# Patient Record
Sex: Male | Born: 1965 | Race: Black or African American | Hispanic: No | Marital: Married | Smoking: Never smoker
Health system: Southern US, Community
[De-identification: ages and names within clinical notes are randomized; demographics above are authoritative.]

## PROBLEM LIST (undated history)

## (undated) DIAGNOSIS — I10 Essential (primary) hypertension: Secondary | ICD-10-CM

## (undated) DIAGNOSIS — I7121 Aneurysm of the ascending aorta, without rupture: Secondary | ICD-10-CM

## (undated) DIAGNOSIS — K226 Gastro-esophageal laceration-hemorrhage syndrome: Secondary | ICD-10-CM

## (undated) DIAGNOSIS — Z973 Presence of spectacles and contact lenses: Secondary | ICD-10-CM

## (undated) DIAGNOSIS — R9431 Abnormal electrocardiogram [ECG] [EKG]: Secondary | ICD-10-CM

## (undated) DIAGNOSIS — I471 Supraventricular tachycardia, unspecified: Secondary | ICD-10-CM

## (undated) DIAGNOSIS — D649 Anemia, unspecified: Secondary | ICD-10-CM

## (undated) DIAGNOSIS — E785 Hyperlipidemia, unspecified: Secondary | ICD-10-CM

## (undated) DIAGNOSIS — R51 Headache: Secondary | ICD-10-CM

## (undated) DIAGNOSIS — E782 Mixed hyperlipidemia: Secondary | ICD-10-CM

## (undated) DIAGNOSIS — L97509 Non-pressure chronic ulcer of other part of unspecified foot with unspecified severity: Secondary | ICD-10-CM

## (undated) DIAGNOSIS — E11621 Type 2 diabetes mellitus with foot ulcer: Secondary | ICD-10-CM

## (undated) DIAGNOSIS — I499 Cardiac arrhythmia, unspecified: Secondary | ICD-10-CM

## (undated) DIAGNOSIS — I5042 Chronic combined systolic (congestive) and diastolic (congestive) heart failure: Secondary | ICD-10-CM

## (undated) DIAGNOSIS — J189 Pneumonia, unspecified organism: Secondary | ICD-10-CM

## (undated) DIAGNOSIS — Z992 Dependence on renal dialysis: Secondary | ICD-10-CM

## (undated) DIAGNOSIS — N186 End stage renal disease: Secondary | ICD-10-CM

## (undated) DIAGNOSIS — I2510 Atherosclerotic heart disease of native coronary artery without angina pectoris: Secondary | ICD-10-CM

## (undated) DIAGNOSIS — I4892 Unspecified atrial flutter: Secondary | ICD-10-CM

## (undated) DIAGNOSIS — B20 Human immunodeficiency virus [HIV] disease: Secondary | ICD-10-CM

## (undated) DIAGNOSIS — M86271 Subacute osteomyelitis, right ankle and foot: Secondary | ICD-10-CM

## (undated) DIAGNOSIS — I712 Thoracic aortic aneurysm, without rupture: Secondary | ICD-10-CM

## (undated) DIAGNOSIS — I1311 Hypertensive heart and chronic kidney disease without heart failure, with stage 5 chronic kidney disease, or end stage renal disease: Secondary | ICD-10-CM

## (undated) DIAGNOSIS — R519 Headache, unspecified: Secondary | ICD-10-CM

## (undated) DIAGNOSIS — E119 Type 2 diabetes mellitus without complications: Secondary | ICD-10-CM

## (undated) DIAGNOSIS — E1152 Type 2 diabetes mellitus with diabetic peripheral angiopathy with gangrene: Principal | ICD-10-CM

## (undated) DIAGNOSIS — I480 Paroxysmal atrial fibrillation: Secondary | ICD-10-CM

## (undated) DIAGNOSIS — N2581 Secondary hyperparathyroidism of renal origin: Secondary | ICD-10-CM

## (undated) HISTORY — DX: Supraventricular tachycardia: I47.1

## (undated) HISTORY — DX: Gastro-esophageal laceration-hemorrhage syndrome: K22.6

## (undated) HISTORY — DX: Supraventricular tachycardia, unspecified: I47.10

## (undated) HISTORY — DX: Chronic combined systolic (congestive) and diastolic (congestive) heart failure: I50.42

## (undated) HISTORY — DX: Abnormal electrocardiogram (ECG) (EKG): R94.31

## (undated) HISTORY — DX: Secondary hyperparathyroidism of renal origin: N25.81

## (undated) HISTORY — DX: Type 2 diabetes mellitus with foot ulcer: E11.621

## (undated) HISTORY — DX: Anemia, unspecified: D64.9

## (undated) HISTORY — DX: Hypertensive heart and chronic kidney disease without heart failure, with stage 5 chronic kidney disease, or end stage renal disease: I13.11

## (undated) HISTORY — PX: AMPUTATION: SHX166

## (undated) HISTORY — DX: Type 2 diabetes mellitus with diabetic peripheral angiopathy with gangrene: E11.52

## (undated) HISTORY — DX: Subacute osteomyelitis, right ankle and foot: M86.271

## (undated) HISTORY — DX: Aneurysm of the ascending aorta, without rupture: I71.21

## (undated) HISTORY — DX: Dependence on renal dialysis: Z99.2

## (undated) HISTORY — DX: Paroxysmal atrial fibrillation: I48.0

## (undated) HISTORY — DX: End stage renal disease: N18.6

## (undated) HISTORY — PX: BELOW KNEE LEG AMPUTATION: SUR23

## (undated) HISTORY — DX: Non-pressure chronic ulcer of other part of unspecified foot with unspecified severity: L97.509

## (undated) HISTORY — DX: Thoracic aortic aneurysm, without rupture: I71.2

## (undated) HISTORY — DX: Hyperlipidemia, unspecified: E78.5

## (undated) HISTORY — PX: AV FISTULA REPAIR: SHX563

## (undated) HISTORY — DX: Unspecified atrial flutter: I48.92

## (undated) HISTORY — DX: Human immunodeficiency virus (HIV) disease: B20

## (undated) HISTORY — DX: Atherosclerotic heart disease of native coronary artery without angina pectoris: I25.10

## (undated) HISTORY — DX: Essential (primary) hypertension: I10

---

## 2012-10-19 HISTORY — PX: AV FISTULA PLACEMENT: SHX1204

## 2016-12-30 DIAGNOSIS — K226 Gastro-esophageal laceration-hemorrhage syndrome: Secondary | ICD-10-CM

## 2016-12-30 HISTORY — DX: Gastro-esophageal laceration-hemorrhage syndrome: K22.6

## 2017-02-07 ENCOUNTER — Encounter: Payer: Self-pay | Admitting: Cardiology

## 2017-02-12 ENCOUNTER — Ambulatory Visit: Payer: Self-pay | Admitting: Cardiology

## 2017-02-14 ENCOUNTER — Telehealth: Payer: Self-pay

## 2017-02-14 ENCOUNTER — Telehealth: Payer: Self-pay | Admitting: Lab

## 2017-02-14 NOTE — Telephone Encounter (Signed)
Patient made appointments on 03/10/17 for labs and 03/24/17 @ 200 to see Dr. Baxter Flattery.

## 2017-02-14 NOTE — Telephone Encounter (Signed)
SENT NOTES TO SCHEDULING 

## 2017-02-14 NOTE — Telephone Encounter (Signed)
02/07/17-He said he would call me back.  He was in a meeting.  I reached out  To him today and he is now scheduled for 03/10/17@1145  for labs and 03/24/17 @ 200 to see Dr. Baxter Flattery.  He asked me to mail his information along with the address.

## 2017-03-10 ENCOUNTER — Other Ambulatory Visit: Payer: Medicare Other

## 2017-03-10 DIAGNOSIS — Z79899 Other long term (current) drug therapy: Secondary | ICD-10-CM

## 2017-03-10 DIAGNOSIS — Z113 Encounter for screening for infections with a predominantly sexual mode of transmission: Secondary | ICD-10-CM

## 2017-03-10 DIAGNOSIS — B20 Human immunodeficiency virus [HIV] disease: Secondary | ICD-10-CM

## 2017-03-10 DIAGNOSIS — Z21 Asymptomatic human immunodeficiency virus [HIV] infection status: Secondary | ICD-10-CM

## 2017-03-10 LAB — HEPATITIS A ANTIBODY, TOTAL: Hep A Total Ab: REACTIVE — AB

## 2017-03-10 LAB — CBC WITH DIFFERENTIAL/PLATELET
BASOS ABS: 69 {cells}/uL (ref 0–200)
Basophils Relative: 1 %
EOS PCT: 4 %
Eosinophils Absolute: 276 cells/uL (ref 15–500)
HCT: 31.2 % — ABNORMAL LOW (ref 38.5–50.0)
HEMOGLOBIN: 10.4 g/dL — AB (ref 13.2–17.1)
LYMPHS ABS: 1173 {cells}/uL (ref 850–3900)
Lymphocytes Relative: 17 %
MCH: 31 pg (ref 27.0–33.0)
MCHC: 33.3 g/dL (ref 32.0–36.0)
MCV: 92.9 fL (ref 80.0–100.0)
MPV: 10.4 fL (ref 7.5–12.5)
Monocytes Absolute: 414 cells/uL (ref 200–950)
Monocytes Relative: 6 %
NEUTROS ABS: 4968 {cells}/uL (ref 1500–7800)
Neutrophils Relative %: 72 %
Platelets: 255 10*3/uL (ref 140–400)
RBC: 3.36 MIL/uL — AB (ref 4.20–5.80)
RDW: 19.9 % — ABNORMAL HIGH (ref 11.0–15.0)
WBC: 6.9 10*3/uL (ref 3.8–10.8)

## 2017-03-10 LAB — HEPATITIS C ANTIBODY: HCV Ab: NEGATIVE

## 2017-03-10 LAB — HEPATITIS B CORE ANTIBODY, TOTAL: HEP B C TOTAL AB: NONREACTIVE

## 2017-03-10 LAB — T-HELPER CELL (CD4) - (RCID CLINIC ONLY)
CD4 T CELL HELPER: 33 % (ref 33–55)
CD4 T Cell Abs: 420 /uL (ref 400–2700)

## 2017-03-10 LAB — HEPATITIS B SURFACE ANTIBODY,QUALITATIVE: HEP B S AB: POSITIVE — AB

## 2017-03-10 LAB — HEPATITIS B SURFACE ANTIGEN: Hepatitis B Surface Ag: NEGATIVE

## 2017-03-11 ENCOUNTER — Telehealth: Payer: Self-pay

## 2017-03-11 LAB — COMPLETE METABOLIC PANEL WITH GFR
ALBUMIN: 3.9 g/dL (ref 3.6–5.1)
ALK PHOS: 150 U/L — AB (ref 40–115)
ALT: 13 U/L (ref 9–46)
AST: 13 U/L (ref 10–35)
BILIRUBIN TOTAL: 0.6 mg/dL (ref 0.2–1.2)
BUN: 45 mg/dL — AB (ref 7–25)
CO2: 24 mmol/L (ref 20–31)
Calcium: 9.7 mg/dL (ref 8.6–10.3)
Chloride: 96 mmol/L — ABNORMAL LOW (ref 98–110)
Creat: 9.13 mg/dL — ABNORMAL HIGH (ref 0.70–1.33)
GFR, Est African American: 7 mL/min — ABNORMAL LOW (ref >=60)
GFR, Est Non African American: 6 mL/min — ABNORMAL LOW (ref >=60)
GLUCOSE: 303 mg/dL — AB (ref 65–99)
Potassium: 4.5 mmol/L (ref 3.5–5.3)
SODIUM: 136 mmol/L (ref 135–146)
TOTAL PROTEIN: 7.3 g/dL (ref 6.1–8.1)

## 2017-03-11 LAB — LIPID PANEL
CHOLESTEROL: 141 mg/dL (ref <200)
HDL: 22 mg/dL — ABNORMAL LOW (ref >40)
LDL Cholesterol: 62 mg/dL (ref <100)
Total CHOL/HDL Ratio: 6.4 Ratio — ABNORMAL HIGH (ref <5.0)
Triglycerides: 284 mg/dL — ABNORMAL HIGH (ref <150)
VLDL: 57 mg/dL — ABNORMAL HIGH (ref <30)

## 2017-03-11 LAB — RPR

## 2017-03-11 NOTE — Telephone Encounter (Signed)
Katrina with [facility] calling to report critical value 9.13 creatinine Value is now available in EPIC.  Please advise.

## 2017-03-12 ENCOUNTER — Encounter: Payer: Self-pay | Admitting: Cardiology

## 2017-03-12 ENCOUNTER — Encounter (INDEPENDENT_AMBULATORY_CARE_PROVIDER_SITE_OTHER): Payer: Self-pay

## 2017-03-12 ENCOUNTER — Ambulatory Visit (INDEPENDENT_AMBULATORY_CARE_PROVIDER_SITE_OTHER): Payer: Medicare Other | Admitting: Cardiology

## 2017-03-12 VITALS — BP 142/80 | HR 93 | Ht 78.0 in | Wt 285.0 lb

## 2017-03-12 DIAGNOSIS — I1311 Hypertensive heart and chronic kidney disease without heart failure, with stage 5 chronic kidney disease, or end stage renal disease: Secondary | ICD-10-CM

## 2017-03-12 DIAGNOSIS — I481 Persistent atrial fibrillation: Secondary | ICD-10-CM

## 2017-03-12 DIAGNOSIS — R002 Palpitations: Secondary | ICD-10-CM

## 2017-03-12 DIAGNOSIS — E118 Type 2 diabetes mellitus with unspecified complications: Secondary | ICD-10-CM

## 2017-03-12 DIAGNOSIS — Z992 Dependence on renal dialysis: Secondary | ICD-10-CM | POA: Diagnosis not present

## 2017-03-12 DIAGNOSIS — N186 End stage renal disease: Secondary | ICD-10-CM

## 2017-03-12 DIAGNOSIS — I4819 Other persistent atrial fibrillation: Secondary | ICD-10-CM

## 2017-03-12 LAB — HIV-1 RNA QUANT-NO REFLEX-BLD
HIV 1 RNA Quant: 30 copies/mL — ABNORMAL HIGH
HIV-1 RNA QUANT, LOG: 1.48 {Log_copies}/mL — AB

## 2017-03-12 LAB — QUANTIFERON TB GOLD ASSAY (BLOOD)
Interferon Gamma Release Assay: NEGATIVE
Mitogen-Nil: 5.65 IU/mL
QUANTIFERON TB AG MINUS NIL: 0.03 [IU]/mL
Quantiferon Nil Value: 0.02 IU/mL

## 2017-03-12 NOTE — Progress Notes (Signed)
Cardiology Office Note    Date:  03/13/2017   ID:  Thomas [name], DOB December 03, 1966, MRN [id number]  PCP:  Thomas [name], PAP  Cardiologist:  Thomas Him, MD   Chief Complaint  Patient presents with  . Establish Care    History of Present Illness:  Thomas [name] is a 51 y.o. male with a history of DM, ESRD, HIV, HTN who is referred here by his PCP, Thomas Ohs, MD, to establish a ne Cardiologist for following his history of SVT and palpitations.  He says that the palpitations started about 6 months.  He only had it happen 2 times.  He went to HD and his heart was beating fast and went to the ER.  He had some type of SVT and had what sounds like from his description as afib or flutter and had a TEE/DCCV and about 2 weeks later had reoccurrence of the arrhythmia and had an ablation.  Since then he has not had any problems.  He has a CHADS2VASC score of 2 and is on chronic anticoagulation with warfarin.  He denies any further problems since his ablation.  He denies any chest pain or pressure.  He occasionally has SOB and only occurs when he has retained too much fluid.  He denies any LE edema.  He denies any syncope but he will get dizzy after HD if too much fluid has been pulled off.      Past Medical History:  Diagnosis Date  . Abdominal pain   . Anemia   . Diabetes ([place])   . ESRD (end stage renal disease) ([place])   . ESRD on dialysis ([place]) 03/13/2017  . Fatigue   . HIV disease ([place])   . Hyperparathyroidism, secondary ([place])   . Hypertension   . Hypertensive heart disease with end stage renal disease on dialysis ([place]) 03/13/2017  . Pulmonary congestion   . SOB (shortness of breath)   . SVT (supraventricular tachycardia) ([place])    ? afib or atrial flutter s/p TEE/DCCV with subsequent ablation due to reoccurrence in [place]    Past Surgical History:  Procedure Laterality Date  . AMPUTATION Left    foot  . RIGHT AV FISTULA PLACEMENT  10/19/2012    Current Medications: Current Meds    Medication Sig  . amLODipine (NORVASC) 10 MG tablet Take 10 mg by mouth daily.  Marland Kitchen aspirin EC 81 MG tablet Take 81 mg by mouth daily.  . cinacalcet (SENSIPAR) 60 MG tablet Take 60 mg by mouth daily.  . fosamprenavir (LEXIVA) 700 MG tablet Take by mouth 2 (two) times daily.  . hydrALAZINE (APRESOLINE) 10 MG tablet Take 10 mg by mouth 3 (three) times daily.  . Insulin Glargine (LANTUS SOLOSTAR) 100 UNIT/ML Solostar Pen Inject 20 Units into the skin daily at 10 pm.  . lisinopril (PRINIVIL,ZESTRIL) 40 MG tablet Take 40 mg by mouth daily.  . metoprolol (LOPRESSOR) 100 MG tablet Take 100 mg by mouth daily.  . Multiple Vitamin (MULTIVITAMIN) tablet Take 1 tablet by mouth daily.  . raltegravir (ISENTRESS) 400 MG tablet Take 400 mg by mouth 2 (two) times daily.  . ritonavir (NORVIR) 100 MG capsule Take 100 mg by mouth daily with breakfast.  . sevelamer carbonate (RENVELA) 800 MG tablet Take 800 mg by mouth 3 (three) times daily with meals.  . warfarin (COUMADIN) 10 MG tablet Take 10 mg by mouth daily.  Marland Kitchen warfarin (COUMADIN) 7.5 MG tablet Take 7.5 mg by mouth daily. TAKE 1 TABLET BY MOUTH  ON Saturday AND Sunday    Allergies:   Patient has no known allergies.   Social History   Social History  . Marital status: Married    Spouse name: kim  . Number of children: 3  . Years of education: college   Occupational History  . disabled    Social History Main Topics  . Smoking status: Never Smoker  . Smokeless tobacco: Never Used  . Alcohol use No  . Drug use: No  . Sexual activity: Not Asked   Other Topics Concern  . None   Social History Narrative  . None     Family History:  The patient's family history includes Heart disease in his father and mother.   ROS:   Please see the history of present illness.    ROS All other systems reviewed and are negative.  No flowsheet data found.     PHYSICAL EXAM:   VS:  BP (!) 142/80   Pulse 93   Ht 6\' 6"  (1.981 m)   Wt 285 lb (129.3 kg)    SpO2 90%   BMI 32.94 kg/m    GEN: Well nourished, well developed, in no acute distress  HEENT: normal  Neck: no JVD, carotid bruits, or masses Cardiac: RRR; no murmurs, rubs, or gallops,no edema.  Intact distal pulses bilaterally.  Respiratory:  clear to auscultation bilaterally, normal work of breathing GI: soft, nontender, nondistended, + BS MS: left AKA Skin: warm and dry, no rash Neuro:  Alert and Oriented x 3, Strength and sensation are intact Psych: euthymic mood, full affect  Wt Readings from Last 3 Encounters:  03/12/17 285 lb (129.3 kg)      Studies/Labs Reviewed:   EKG:  EKG is ordered today.  The ekg ordered today demonstrates NSR with no ST changes  Recent Labs: 03/10/2017: ALT 13; BUN 45; Creat 9.13; Hemoglobin 10.4; Platelets 255; Potassium 4.5; Sodium 136   Lipid Panel    Component Value Date/Time   CHOL 141 03/10/2017 1217   TRIG 284 (H) 03/10/2017 1217   HDL 22 (L) 03/10/2017 1217   CHOLHDL 6.4 (H) 03/10/2017 1217   VLDL 57 (H) 03/10/2017 1217   LDLCALC 62 03/10/2017 1217    Additional studies/ records that were reviewed today include:  Office notes from PCP    ASSESSMENT:    1. Persistent atrial fibrillation (HCC)   2. Palpitations   3. Hypertensive heart disease with end stage renal disease on dialysis ([place])   4. ESRD on dialysis ([place])   5. Type 2 diabetes mellitus with complication, without long-term current use of insulin (HCC)      PLAN:  In order of problems listed above:  1. Persistent atrial fibrillation - by his description it sounds like he had atrial fibrillation or atrial flutter in [place] and had a TEE/DCCV but several weeks later had recurrent arrhythmias and underwent ablation.  I do not have any records from these procedures but will get a copy from the [facility].  He has not had any further problems with reoccurrence.  He is on warfarin due to CHADS2VASC score of 2 (HTN, DM).  His INR is currently followed by his  Nephrologist but he would like it followed at our office so we will set [name] up in Coumadin clinic.   2.   Palpitations - these resolved after his ablation.  3.   Hypertensive heart disease with ESRD on HD. - His BP is well controlled today.  He will continue on  ACE I, hydralazine, amlodipine and BB.     4.  ESRD on HD followed by nephrology.     5.  Type II DM with complications of ESRD and DM.  BS followed by PCP.  Will get a copy of HbA1C.    Medication Adjustments/Labs and Tests Ordered: Current medicines are reviewed at length with the patient today.  Concerns regarding medicines are outlined above.  Medication changes, Labs and Tests ordered today are listed in the Patient Instructions below.  Patient Instructions  Medication Instructions:  Your physician recommends that you continue on your current medications as directed. Please refer to the Current Medication list given to you today.   Labwork: None  Testing/Procedures: None  Follow-Up: Your physician wants you to follow-up in: 6 months with Dr. [name]. You will receive a reminder letter in the mail two months in advance. If you don't receive a letter, please call our office to schedule the follow-up appointment.   Any Other Special Instructions Will Be Listed Below (If Applicable).     If you need a refill on your cardiac medications before your next appointment, please call your pharmacy.      Signed, Thomas Him, MD  03/13/2017 8:10 PM    [facility] [address], [place],   [address] Phone: [phone]; Fax: [phone]

## 2017-03-12 NOTE — Patient Instructions (Signed)

## 2017-03-13 ENCOUNTER — Telehealth: Payer: Self-pay | Admitting: Cardiology

## 2017-03-13 ENCOUNTER — Encounter: Payer: Self-pay | Admitting: Cardiology

## 2017-03-13 DIAGNOSIS — E118 Type 2 diabetes mellitus with unspecified complications: Secondary | ICD-10-CM | POA: Insufficient documentation

## 2017-03-13 DIAGNOSIS — N186 End stage renal disease: Secondary | ICD-10-CM | POA: Insufficient documentation

## 2017-03-13 DIAGNOSIS — Z992 Dependence on renal dialysis: Secondary | ICD-10-CM

## 2017-03-13 DIAGNOSIS — I1311 Hypertensive heart and chronic kidney disease without heart failure, with stage 5 chronic kidney disease, or end stage renal disease: Secondary | ICD-10-CM

## 2017-03-13 DIAGNOSIS — I471 Supraventricular tachycardia: Secondary | ICD-10-CM | POA: Insufficient documentation

## 2017-03-13 HISTORY — DX: Hypertensive heart and chronic kidney disease without heart failure, with stage 5 chronic kidney disease, or end stage renal disease: I13.11

## 2017-03-13 NOTE — Telephone Encounter (Signed)
Please get a copy of patient's hospital record of [place] and ablation from [facility] hospital in [place] as well as labs from PCP

## 2017-03-14 LAB — HLA B*5701: HLA-B 5701 W/RFLX HLA-B HIGH: NEGATIVE

## 2017-03-17 NOTE — Telephone Encounter (Signed)
Message sent to Medical Records to obtain.

## 2017-03-24 ENCOUNTER — Ambulatory Visit (INDEPENDENT_AMBULATORY_CARE_PROVIDER_SITE_OTHER): Payer: Medicare Other | Admitting: Internal Medicine

## 2017-03-24 ENCOUNTER — Encounter: Payer: Self-pay | Admitting: Internal Medicine

## 2017-03-24 VITALS — BP 154/74 | HR 79 | Temp 97.8°F | Wt 286.0 lb

## 2017-03-24 DIAGNOSIS — E119 Type 2 diabetes mellitus without complications: Secondary | ICD-10-CM | POA: Diagnosis not present

## 2017-03-24 DIAGNOSIS — Z992 Dependence on renal dialysis: Secondary | ICD-10-CM | POA: Diagnosis not present

## 2017-03-24 DIAGNOSIS — N186 End stage renal disease: Secondary | ICD-10-CM

## 2017-03-24 DIAGNOSIS — Z794 Long term (current) use of insulin: Secondary | ICD-10-CM

## 2017-03-24 DIAGNOSIS — I10 Essential (primary) hypertension: Secondary | ICD-10-CM | POA: Diagnosis not present

## 2017-03-24 DIAGNOSIS — IMO0001 Reserved for inherently not codable concepts without codable children: Secondary | ICD-10-CM

## 2017-03-24 DIAGNOSIS — B20 Human immunodeficiency virus [HIV] disease: Secondary | ICD-10-CM | POA: Diagnosis present

## 2017-03-24 NOTE — Progress Notes (Signed)
RFV: hiv disease. Engaging in care  Patient ID: Thomas [name], male   DOB: [birth year], 51 y.o.   MRN: [id number]  HPI Mr Dantonio is a new patient to our clinic. He is a 51yo M with HIV disease, ESRD on HD. He states that he has had well controlled HIV disease, CD 4 count of 420/VL 30 (march 2018) on raltegravir - boosted fosampranivir.   Originally from [place] and decided to move to [place] - roughly 3 months to be closer to his brother. Moved down with his wife and MIL  HIV disease, dx roughly 10 years ago, start ART right at time of dx. Cd 4 count were high. He contracted hiv through sex with women. No ivdu, no MSM. He is not familiar with which his regimens he has been on. In fact, he is unsure of his doctors, who was his hiv physician  Had hospitalizatoin pneumonia last year  Colonoscopy this past year.  ESRD on HD T-Th-Sat. For the past 5 years.   Hep b immune  Social hx:  Newlywed in oct 20 th 2017 - no sex with men or any ivdu - has 51yo daughter, lives in [place].   Family hx: Diabetes. Father died of CAD, ? 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 amLODipine (NORVASC) 10 MG tablet Take 10 mg by mouth daily.  Marland Kitchen aspirin EC 81 MG tablet Take 81 mg by mouth daily.  . cinacalcet (SENSIPAR) 60 MG tablet Take 60 mg by mouth daily.  . fosamprenavir (LEXIVA) 700 MG tablet Take by mouth 2 (two) times daily.  . hydrALAZINE (APRESOLINE) 10 MG tablet Take 10 mg by mouth 3 (three) times daily.  . Insulin Glargine (LANTUS SOLOSTAR) 100 UNIT/ML Solostar Pen Inject 20 Units into the skin daily at 10 pm.  . lisinopril (PRINIVIL,ZESTRIL) 40 MG tablet Take 40 mg by mouth daily.  . metoprolol (LOPRESSOR) 100  MG tablet Take 100 mg by mouth daily.  . Multiple Vitamin (MULTIVITAMIN) tablet Take 1 tablet by mouth daily.  . raltegravir (ISENTRESS) 400 MG tablet Take 400 mg by mouth 2 (two) times daily.  . ritonavir (NORVIR) 100 MG capsule Take 100 mg by mouth daily with breakfast.  . sevelamer carbonate (RENVELA) 800 MG tablet Take 800 mg by mouth 3 (three) times daily with meals.  . warfarin (COUMADIN) 10 MG tablet Take 10 mg by mouth daily.  Marland Kitchen warfarin (COUMADIN) 7.5 MG tablet Take 7.5 mg by mouth daily. TAKE 1 TABLET BY MOUTH ON Saturday AND Sunday   No facility-administered encounter medications on file as of 03/24/2017.      Patient Active Problem List   Diagnosis Date Noted  . Hypertensive heart disease with end stage renal disease on dialysis ([place]) 03/13/2017  . ESRD on dialysis ([place]) 03/13/2017  . Type 2 diabetes mellitus with complication ([place]) 22/29/7989  . SVT (supraventricular tachycardia) ([place])    Social History  Substance Use Topics  . Smoking status: Never Smoker  . Smokeless tobacco: Never Used  . Alcohol use No   family history includes Heart disease in his father and mother.  Health Maintenance Due  Topic Date Due  . HEMOGLOBIN A1C  22[birth year]Dec-1966  . PNEUMOCOCCAL POLYSACCHARIDE VACCINE (1) 07/14/1968  .  FOOT EXAM  07/14/1976  . OPHTHALMOLOGY EXAM  07/14/1976  . TETANUS/TDAP  07/14/1985  . COLONOSCOPY  07/14/2016  . INFLUENZA VACCINE  07/30/2016     Review of Systems Review of Systems  Constitutional: Negative for fever, chills, diaphoresis, activity change, appetite change, fatigue and unexpected weight change.  HENT: Negative for congestion, sore throat, rhinorrhea, sneezing, trouble swallowing and sinus pressure.  Eyes: Negative for photophobia and visual disturbance.  Respiratory: Negative for cough, chest tightness, shortness of breath, wheezing and stridor.  Cardiovascular: Negative for chest pain, palpitations and leg swelling.  Gastrointestinal: Negative for  nausea, vomiting, abdominal pain, diarrhea, constipation, blood in stool, abdominal distention and anal bleeding.  Genitourinary: Negative for dysuria, hematuria, flank pain and difficulty urinating.  Musculoskeletal: Negative for myalgias, back pain, joint swelling, arthralgias and gait problem.  Skin: Negative for color change, pallor, rash and wound.  Neurological: Negative for dizziness, tremors, weakness and light-headedness.  Hematological: Negative for adenopathy. Does not bruise/bleed easily.  Psychiatric/Behavioral: Negative for behavioral problems, confusion, sleep disturbance, dysphoric mood, decreased concentration and agitation.    Physical Exam   BP (!) 154/74   Pulse 79   Temp 97.8 F (36.6 C) (Oral)   Wt 286 lb (129.7 kg)   BMI 33.05 kg/m   Physical Exam  Constitutional: He is oriented to person, place, and time. He appears well-developed and well-nourished. No distress.  HENT:  Mouth/Throat: Oropharynx is clear and moist. No oropharyngeal exudate.  Cardiovascular: Normal rate, regular rhythm and normal heart sounds. Exam reveals no gallop and no friction rub.  No murmur heard.  Pulmonary/Chest: Effort normal and breath sounds normal. No respiratory distress. He has no wheezes.  Abdominal: Soft. Bowel sounds are normal. He exhibits no distension. There is no tenderness.  Lymphadenopathy:  He has no cervical adenopathy.  Neurological: He is alert and oriented to person, place, and time.  Skin: Skin is warm and dry. No rash noted. No erythema.  Ext: left bka. Right arm fistula, wrapped, +thrill Psychiatric: He has a normal mood and affect. His behavior is normal.     Lab Results  Component Value Date   CD4TCELL 33 03/10/2017   Lab Results  Component Value Date   CD4TABS 420 03/10/2017   Lab Results  Component Value Date   HIV1RNAQUANT 30 (H) 03/10/2017   Lab Results  Component Value Date   HEPBSAB POS (A) 03/10/2017   Lab Results  Component Value Date    LABRPR NON REAC 03/10/2017    CBC Lab Results  Component Value Date   WBC 6.9 03/10/2017   RBC 3.36 (L) 03/10/2017   HGB 10.4 (L) 03/10/2017   HCT 31.2 (L) 03/10/2017   PLT 255 03/10/2017   MCV 92.9 03/10/2017   MCH 31.0 03/10/2017   MCHC 33.3 03/10/2017   RDW 19.9 (H) 03/10/2017   LYMPHSABS 1,173 03/10/2017   MONOABS 414 03/10/2017   EOSABS 276 03/10/2017    BMET Lab Results  Component Value Date   NA 136 03/10/2017   K 4.5 03/10/2017   CL 96 (L) 03/10/2017   CO2 24 03/10/2017   GLUCOSE 303 (H) 03/10/2017   BUN 45 (H) 03/10/2017   CREATININE 9.13 (H) 03/10/2017   CALCIUM 9.7 03/10/2017   GFRNONAA 6 (L) 03/10/2017   GFRAA 7 (L) 03/10/2017     Assessment and Plan  HIV disease - well controlled with VL<50 though  he is on a dated regimen on RLG, fosamprinivr/ritonivr. Lacking any NNRTI.  No records from his  hiv clnic. I would like to get an archive genotype to find out his background resistance so that we can construct a better regimen as well as getting him off of fosamprinivir  Will need to try to figure out who prescribes his meds to locate his hiv record.  HTN = he is goig to take his afternoon meds, but also going to HD tomorrow  IDDM = will have him follow up with endocrinologist to ensure his regimen is providing adequate control for his IDDM  Spent 60 min with patient assessing , counseling nad management of hiv disease

## 2017-03-25 DIAGNOSIS — B20 Human immunodeficiency virus [HIV] disease: Secondary | ICD-10-CM | POA: Insufficient documentation

## 2017-04-14 ENCOUNTER — Encounter: Payer: Self-pay | Admitting: *Deleted

## 2017-05-05 ENCOUNTER — Emergency Department (HOSPITAL_COMMUNITY): Payer: Medicare Other

## 2017-05-05 ENCOUNTER — Inpatient Hospital Stay (HOSPITAL_COMMUNITY)
Admission: EM | Admit: 2017-05-05 | Discharge: 2017-05-08 | DRG: 291 | Disposition: A | Discharge status: Home / Self Care | Payer: Medicare Other | Attending: Internal Medicine | Admitting: Internal Medicine

## 2017-05-05 ENCOUNTER — Encounter (HOSPITAL_COMMUNITY): Payer: Self-pay

## 2017-05-05 DIAGNOSIS — E1121 Type 2 diabetes mellitus with diabetic nephropathy: Secondary | ICD-10-CM

## 2017-05-05 DIAGNOSIS — R748 Abnormal levels of other serum enzymes: Secondary | ICD-10-CM | POA: Diagnosis not present

## 2017-05-05 DIAGNOSIS — Z79899 Other long term (current) drug therapy: Secondary | ICD-10-CM

## 2017-05-05 DIAGNOSIS — B20 Human immunodeficiency virus [HIV] disease: Secondary | ICD-10-CM | POA: Diagnosis present

## 2017-05-05 DIAGNOSIS — E1165 Type 2 diabetes mellitus with hyperglycemia: Secondary | ICD-10-CM | POA: Diagnosis not present

## 2017-05-05 DIAGNOSIS — I5023 Acute on chronic systolic (congestive) heart failure: Secondary | ICD-10-CM | POA: Diagnosis present

## 2017-05-05 DIAGNOSIS — I440 Atrioventricular block, first degree: Secondary | ICD-10-CM | POA: Diagnosis present

## 2017-05-05 DIAGNOSIS — R0602 Shortness of breath: Secondary | ICD-10-CM | POA: Diagnosis present

## 2017-05-05 DIAGNOSIS — I4581 Long QT syndrome: Secondary | ICD-10-CM | POA: Diagnosis present

## 2017-05-05 DIAGNOSIS — Z7901 Long term (current) use of anticoagulants: Secondary | ICD-10-CM | POA: Diagnosis not present

## 2017-05-05 DIAGNOSIS — E118 Type 2 diabetes mellitus with unspecified complications: Secondary | ICD-10-CM | POA: Diagnosis not present

## 2017-05-05 DIAGNOSIS — R079 Chest pain, unspecified: Secondary | ICD-10-CM

## 2017-05-05 DIAGNOSIS — N2581 Secondary hyperparathyroidism of renal origin: Secondary | ICD-10-CM | POA: Diagnosis present

## 2017-05-05 DIAGNOSIS — Z794 Long term (current) use of insulin: Secondary | ICD-10-CM | POA: Diagnosis not present

## 2017-05-05 DIAGNOSIS — R7989 Other specified abnormal findings of blood chemistry: Secondary | ICD-10-CM | POA: Diagnosis not present

## 2017-05-05 DIAGNOSIS — I1311 Hypertensive heart and chronic kidney disease without heart failure, with stage 5 chronic kidney disease, or end stage renal disease: Secondary | ICD-10-CM | POA: Diagnosis not present

## 2017-05-05 DIAGNOSIS — J9601 Acute respiratory failure with hypoxia: Secondary | ICD-10-CM | POA: Diagnosis not present

## 2017-05-05 DIAGNOSIS — J810 Acute pulmonary edema: Secondary | ICD-10-CM | POA: Diagnosis not present

## 2017-05-05 DIAGNOSIS — R778 Other specified abnormalities of plasma proteins: Secondary | ICD-10-CM | POA: Diagnosis present

## 2017-05-05 DIAGNOSIS — I248 Other forms of acute ischemic heart disease: Secondary | ICD-10-CM | POA: Diagnosis present

## 2017-05-05 DIAGNOSIS — D631 Anemia in chronic kidney disease: Secondary | ICD-10-CM | POA: Diagnosis present

## 2017-05-05 DIAGNOSIS — Z7982 Long term (current) use of aspirin: Secondary | ICD-10-CM | POA: Diagnosis not present

## 2017-05-05 DIAGNOSIS — J9600 Acute respiratory failure, unspecified whether with hypoxia or hypercapnia: Secondary | ICD-10-CM | POA: Diagnosis present

## 2017-05-05 DIAGNOSIS — Z992 Dependence on renal dialysis: Secondary | ICD-10-CM

## 2017-05-05 DIAGNOSIS — Z8249 Family history of ischemic heart disease and other diseases of the circulatory system: Secondary | ICD-10-CM

## 2017-05-05 DIAGNOSIS — E1122 Type 2 diabetes mellitus with diabetic chronic kidney disease: Secondary | ICD-10-CM | POA: Diagnosis present

## 2017-05-05 DIAGNOSIS — J811 Chronic pulmonary edema: Secondary | ICD-10-CM | POA: Diagnosis present

## 2017-05-05 DIAGNOSIS — N186 End stage renal disease: Secondary | ICD-10-CM | POA: Diagnosis present

## 2017-05-05 DIAGNOSIS — I132 Hypertensive heart and chronic kidney disease with heart failure and with stage 5 chronic kidney disease, or end stage renal disease: Principal | ICD-10-CM | POA: Diagnosis present

## 2017-05-05 DIAGNOSIS — I351 Nonrheumatic aortic (valve) insufficiency: Secondary | ICD-10-CM | POA: Diagnosis not present

## 2017-05-05 DIAGNOSIS — I482 Chronic atrial fibrillation: Secondary | ICD-10-CM | POA: Diagnosis present

## 2017-05-05 DIAGNOSIS — E8889 Other specified metabolic disorders: Secondary | ICD-10-CM | POA: Diagnosis present

## 2017-05-05 DIAGNOSIS — E8770 Fluid overload, unspecified: Secondary | ICD-10-CM | POA: Diagnosis present

## 2017-05-05 DIAGNOSIS — E8779 Other fluid overload: Secondary | ICD-10-CM | POA: Diagnosis not present

## 2017-05-05 LAB — COMPREHENSIVE METABOLIC PANEL
ALBUMIN: 3.5 g/dL (ref 3.5–5.0)
ALT: 12 U/L — ABNORMAL LOW (ref 17–63)
ANION GAP: 16 — AB (ref 5–15)
AST: 13 U/L — ABNORMAL LOW (ref 15–41)
Alkaline Phosphatase: 131 U/L — ABNORMAL HIGH (ref 38–126)
BUN: 51 mg/dL — ABNORMAL HIGH (ref 6–20)
CALCIUM: 9 mg/dL (ref 8.9–10.3)
CHLORIDE: 92 mmol/L — AB (ref 101–111)
CO2: 23 mmol/L (ref 22–32)
Creatinine, Ser: 10.14 mg/dL — ABNORMAL HIGH (ref 0.61–1.24)
GFR calc Af Amer: 6 mL/min — ABNORMAL LOW (ref >60)
GFR calc non Af Amer: 5 mL/min — ABNORMAL LOW (ref >60)
Glucose, Bld: 465 mg/dL — ABNORMAL HIGH (ref 65–99)
Potassium: 4.5 mmol/L (ref 3.5–5.1)
SODIUM: 131 mmol/L — AB (ref 135–145)
TOTAL PROTEIN: 7.9 g/dL (ref 6.5–8.1)
Total Bilirubin: 1.2 mg/dL (ref 0.3–1.2)

## 2017-05-05 LAB — CBC
HCT: 27.8 % — ABNORMAL LOW (ref 39.0–52.0)
HEMATOCRIT: 30.3 % — AB (ref 39.0–52.0)
HEMOGLOBIN: 9.8 g/dL — AB (ref 13.0–17.0)
Hemoglobin: 9.1 g/dL — ABNORMAL LOW (ref 13.0–17.0)
MCH: 29.8 pg (ref 26.0–34.0)
MCH: 30.2 pg (ref 26.0–34.0)
MCHC: 32.3 g/dL (ref 30.0–36.0)
MCHC: 32.7 g/dL (ref 30.0–36.0)
MCV: 92.1 fL (ref 78.0–100.0)
MCV: 92.4 fL (ref 78.0–100.0)
PLATELETS: 243 10*3/uL (ref 150–400)
Platelets: 242 10*3/uL (ref 150–400)
RBC: 3.29 MIL/uL — ABNORMAL LOW (ref 4.22–5.81)
RBC: 3.01 MIL/uL — AB (ref 4.22–5.81)
RDW: 16.3 % — ABNORMAL HIGH (ref 11.5–15.5)
RDW: 16.5 % — AB (ref 11.5–15.5)
WBC: 12.3 10*3/uL — ABNORMAL HIGH (ref 4.0–10.5)
WBC: 12.2 10*3/uL — AB (ref 4.0–10.5)

## 2017-05-05 LAB — BRAIN NATRIURETIC PEPTIDE: B NATRIURETIC PEPTIDE 5: 1980.3 pg/mL — AB (ref 0.0–100.0)

## 2017-05-05 LAB — PROTIME-INR
INR: 2.64
Prothrombin Time: 28.7 seconds — ABNORMAL HIGH (ref 11.4–15.2)

## 2017-05-05 LAB — I-STAT TROPONIN, ED: Troponin i, poc: 1.07 ng/mL (ref 0.00–0.08)

## 2017-05-05 LAB — CBG MONITORING, ED: GLUCOSE-CAPILLARY: 498 mg/dL — AB (ref 65–99)

## 2017-05-05 MED ORDER — SODIUM CHLORIDE 0.9 % IV SOLN: 100.0000 mL | INTRAVENOUS | Status: DC | PRN

## 2017-05-05 MED ORDER — INSULIN ASPART 100 UNIT/ML ~~LOC~~ SOLN
0.0000 [IU] | SUBCUTANEOUS | Status: DC
  Administered 2017-05-06: 9 [IU] via SUBCUTANEOUS

## 2017-05-05 MED ORDER — ALTEPLASE 2 MG IJ SOLR: 2.0000 mg | Freq: Once | INTRAMUSCULAR | Status: DC | PRN

## 2017-05-05 MED ORDER — LIDOCAINE HCL (PF) 1 % IJ SOLN
5.0000 mL | INTRAMUSCULAR | Status: DC | PRN
  Filled 2017-05-05: qty 5

## 2017-05-05 MED ORDER — INSULIN ASPART 100 UNIT/ML ~~LOC~~ SOLN
SUBCUTANEOUS | Status: AC
  Administered 2017-05-05: 10 [IU]
  Filled 2017-05-05: qty 1

## 2017-05-05 MED ORDER — NITROGLYCERIN 0.4 MG SL SUBL
0.4000 mg | SUBLINGUAL_TABLET | Freq: Once | SUBLINGUAL | Status: AC
  Administered 2017-05-05: 0.4 mg via SUBLINGUAL
  Filled 2017-05-05: qty 1

## 2017-05-05 MED ORDER — HEPARIN SODIUM (PORCINE) 1000 UNIT/ML DIALYSIS: 4000.0000 [IU] | Freq: Once | INTRAMUSCULAR | Status: DC

## 2017-05-05 MED ORDER — PENTAFLUOROPROP-TETRAFLUOROETH EX AERO: 1.0000 "application " | INHALATION_SPRAY | CUTANEOUS | Status: DC | PRN

## 2017-05-05 MED ORDER — ONDANSETRON HCL 4 MG/2ML IJ SOLN
4.0000 mg | Freq: Once | INTRAMUSCULAR | Status: AC
  Administered 2017-05-05: 4 mg via INTRAVENOUS
  Filled 2017-05-05: qty 2

## 2017-05-05 MED ORDER — WARFARIN - PHARMACIST DOSING INPATIENT: Freq: Every day | Status: DC

## 2017-05-05 MED ORDER — HEPARIN SODIUM (PORCINE) 1000 UNIT/ML DIALYSIS: 1000.0000 [IU] | INTRAMUSCULAR | Status: DC | PRN

## 2017-05-05 MED ORDER — LIDOCAINE-PRILOCAINE 2.5-2.5 % EX CREA: 1.0000 "application " | TOPICAL_CREAM | CUTANEOUS | Status: DC | PRN

## 2017-05-05 NOTE — ED Notes (Signed)
Pt's o2 sat dropped to 82 and remained on the [place], pt placed back on NRB at 15L, spo2 now at 90%

## 2017-05-05 NOTE — ED Notes (Signed)
Pt. transported to hemodialysis unit , report given to [place] .

## 2017-05-05 NOTE — ED Notes (Signed)
Pt on Bipap and now more comfortable and breathing difficulty resolved. Pt able to breath more effectively now and nausea subsided.

## 2017-05-05 NOTE — ED Notes (Signed)
[id number] 

## 2017-05-05 NOTE — ED Provider Notes (Signed)
Thomas [name] DEPT Provider Note   CSN: [id number] Arrival date & time: 05/05/17  1622     History   Chief Complaint Chief Complaint  Patient presents with  . Shortness of Breath    HPI Thomas [name] is a 51 y.o. male.  The history is provided by the patient.  Shortness of Breath  This is a new problem. The average episode lasts 3 days. The problem occurs continuously.Episode onset: sat night. The problem has been rapidly worsening. Associated symptoms include cough, PND, orthopnea, chest pain (chest pain yesterday) and leg swelling. Pertinent negatives include no fever and no syncope.    Past Medical History:  Diagnosis Date  . Abdominal pain   . Anemia   . Diabetes ([place])   . ESRD (end stage renal disease) ([place])   . ESRD on dialysis ([place]) 03/13/2017  . Fatigue   . HIV disease ([place])   . Hyperparathyroidism, secondary ([place])   . Hypertension   . Hypertensive heart disease with end stage renal disease on dialysis ([place]) 03/13/2017  . Pulmonary congestion   . SOB (shortness of breath)   . SVT (supraventricular tachycardia) ([place])    ? afib or atrial flutter s/p TEE/DCCV with subsequent ablation due to reoccurrence in [place]    Patient Active Problem List   Diagnosis Date Noted  . HIV disease ([place]) 03/25/2017  . Hypertensive heart disease with end stage renal disease on dialysis ([place]) 03/13/2017  . ESRD on dialysis ([place]) 03/13/2017  . Type 2 diabetes mellitus with complication ([place]) 10/93/2355  . SVT (supraventricular tachycardia) (HCC)     Past Surgical History:  Procedure Laterality Date  . AMPUTATION Left    foot  . RIGHT AV FISTULA PLACEMENT  10/19/2012       Home Medications    Prior to Admission medications   Medication Sig Start Date End Date Taking? Authorizing Provider  amLODipine (NORVASC) 10 MG tablet Take 10 mg by mouth daily.    [provider]  aspirin EC 81 MG tablet Take 81 mg by mouth daily.    [provider]  cinacalcet  (SENSIPAR) 60 MG tablet Take 60 mg by mouth daily.    [provider]  fosamprenavir (LEXIVA) 700 MG tablet Take by mouth 2 (two) times daily.    [provider]  hydrALAZINE (APRESOLINE) 10 MG tablet Take 10 mg by mouth 3 (three) times daily.    [provider]  Insulin Glargine (LANTUS SOLOSTAR) 100 UNIT/ML Solostar Pen Inject 20 Units into the skin daily at 10 pm.    [provider]  lisinopril (PRINIVIL,ZESTRIL) 40 MG tablet Take 40 mg by mouth daily.    [provider]  metoprolol (LOPRESSOR) 100 MG tablet Take 100 mg by mouth daily.    [provider]  Multiple Vitamin (MULTIVITAMIN) tablet Take 1 tablet by mouth daily.    [provider]  raltegravir (ISENTRESS) 400 MG tablet Take 400 mg by mouth 2 (two) times daily.    [provider]  ritonavir (NORVIR) 100 MG capsule Take 100 mg by mouth daily with breakfast.    [provider]  sevelamer carbonate (RENVELA) 800 MG tablet Take 800 mg by mouth 3 (three) times daily with meals.    [provider]  warfarin (COUMADIN) 10 MG tablet Take 10 mg by mouth daily.    [provider]  warfarin (COUMADIN) 7.5 MG tablet Take 7.5 mg by mouth daily. TAKE 1 TABLET BY MOUTH ON Saturday AND Sunday  [provider]    Family History Family History  Problem Relation Age of Onset  . Heart disease Mother   . Heart disease Father     Social History Social History  Substance Use Topics  . Smoking status: Never Smoker  . Smokeless tobacco: Never Used  . Alcohol use No     Allergies   Patient has no known allergies.   Review of Systems Review of Systems  Constitutional: Negative for fever.  Respiratory: Positive for cough and shortness of breath.   Cardiovascular: Positive for chest pain (chest pain yesterday), orthopnea, leg swelling and PND. Negative for syncope.  Gastrointestinal: Negative.   Genitourinary: Negative.        Only  makes a small amount of urine every other day  Neurological: Negative.   All other systems reviewed and are negative.    Physical Exam Updated Vital Signs ED Triage Vitals  Enc Vitals Group     BP 05/05/17 1627 (!) 152/83     Pulse Rate 05/05/17 1627 92     Resp 05/05/17 1627 (!) 28     Temp 05/05/17 1627 98.8 F (37.1 C)     Temp Source 05/05/17 1627 Oral     SpO2 05/05/17 1622 92 %     Weight 05/05/17 1627 283 lb (128.4 kg)     Height 05/05/17 1627 6\' 6"  (1.981 m)     Head Circumference --      Peak Flow --      Pain Score 05/05/17 1625 0     Pain Loc --      Pain Edu? --      Excl. in [place]? --      Physical Exam  Constitutional: He is oriented to person, place, and time. He appears well-developed and well-nourished. He appears distressed.  HENT:  Head: Normocephalic and atraumatic.  Mouth/Throat: Oropharynx is clear and moist.  Eyes: Conjunctivae and EOM are normal. Pupils are equal, round, and reactive to light.  Neck: Normal range of motion. JVD present.  Cardiovascular: Normal rate, regular rhythm and normal heart sounds.   Pulmonary/Chest: He is in respiratory distress. He has rales.  Bibasilar crackles. No wheezing. Increased effort  Abdominal: Soft. He exhibits no distension. There is no tenderness.  Musculoskeletal: He exhibits edema. He exhibits no tenderness or deformity.  Left sided lower extr amputation Right sided with 1+ pitting edema  Neurological: He is alert and oriented to person, place, and time. No cranial nerve deficit. He exhibits normal muscle tone. Coordination normal.  Skin: Skin is warm and dry. No rash noted. He is not diaphoretic. No erythema. No pallor.  Psychiatric: He has a normal mood and affect.  Nursing note and vitals reviewed.    ED Treatments / Results  Labs (all labs ordered are listed, but only abnormal results are displayed) Labs Reviewed  [place], ED     EKG  EKG Interpretation None       Radiology No results found.  Procedures Procedures (including critical care time)  Medications Ordered in ED Medications - No data to display   Initial Impression / Assessment and Plan / ED Course  I have reviewed the triage vital signs and the nursing notes.  Pertinent labs & imaging results that were available during my care of the patient were reviewed by me and considered in my medical decision making (see chart for details).     Patient is a  51 year old male with a history of incisional disease on dialysis Tuesday Thursday Saturday who presents with progressive shortness of breath and began Saturday night and had some chest pain yesterday. Has been compliant with all of his medications and dialysis. No fevers but he does report a cough and left-sided chest pain that he describes as a pressure and intermittent in nature. On exam he does have increased work of breathing and is requiring oxygen for desaturations. He does appear to be volume overloaded. Labs obtained with elevated troponin as well as BNP and chest x-ray with pulmonary edema. EKG with nonspecific ST changes and no priors to compare to. During his stay he started complaining of worsening chest pain as well as diaphoresis and repeat EKG obtained without evolving changes but he was requiring BiPAP with resolution of symptoms. Aspirin and nitroglycerin given. At this time differential includes volume overload, possible ACS, and less likely pneumonia, or PE. Consult to nephrology who will schedule emergent dialysis. Given the patient was requiring a new O2 requirement and subsequently developed a BiPAP requirement he'll be admitted to the stepdown unit for further management  Final Clinical Impressions(s) / ED Diagnoses   Final diagnoses:  None    New Prescriptions New Prescriptions   No medications on file     Heriberto Antigua, MD 05/06/17 1746    Veryl Speak, MD 05/06/17  2207

## 2017-05-05 NOTE — Consult Note (Signed)
Reason for Consult:ESRD Referring Physician: Dr. Roel Cluck  Chief Complaint: Dyspnea  Assessment/Plan: 1 Volume overload - according to ED weights he is about 7kg up on his EDW but it's not a floor weight so i'm not convinced; however, he is certainly volume overloaded by history and on physical exam. - Will HD him tonight for UF (goal 3liters) and he should receive HD again tomorrow on his usual TTS regimen. 2 ESRD: TTS @ [facility] w/ Dr. Lorrene Reid; per pt he thought more fluid needed to be removed on Sat. According to records he stayed for the full 4hr 67mn treatment and left at 121.3kg with a EDW of 120.5kg. - Heparin 5000 unit bolus given at beginning of treatment. - will monitor for CP as he reportedly had pressure like symptoms as well; h/o arrhythmias previously but no cardiac caths per pt. - Will weigh on a floor scale after treatment and possibly challenge EDW as tolerated. 3 Hypertension: Controlled; we will need to monitor given the UF today and tomorrow; may need to hold meds to permit challenging of EDW. Per pt he never cramps. 4. Anemia of ESRD: Stable; last Mircera 150 q2weeks given on 5/3. 5. Metabolic Bone Disease: Phos in low 5's and PTH trending down 1035 --> 780 from Mar to April 2018. 6. r/o angina 7. DM 8. HIV 9. h/o possible afib w/ ablation on Coumadin.  Seen on Dialysis on 05/05/2017 @ 920pm Change from 2K to 3K and decrease UF from 4.5 liters to 3L Will hold heparin given we only will have one oncall nurse on duty tonight BP 120/78 and tolerating treatment w/ no cramps or chest pain; he states breathing is starting to improve.  [facility]; on HD for past 5 years Primary Nephrologist Dr. DLorrene ReidEDW 120.5kg HD [place] 3/2.25 Heparin 5000 unit bolus Access Rt [place]  HPI: Thomas Moringis a 51y.o. male [place] [facility] [facility] [place] Dr. DLorrene Reidand received a full treatment on Sat leaving at 121.3kg with a EDW of 120.5kg. According to the pt he  never cramps and thought he should have had more fluid removed. He is presenting to the ED with a 1-2 day h/o dyspnea, orthopnea, intermittent CP 8/10 described as pressure like over the left lower sternal border which initially started at night and was non exertional. He has never had this pain before and denies any trauma. He also denies any f/c/v/d/abd pain/myalgias or exposure to sick contact. He was noted to have a RA sat of 70% in the ED, started on CPAP and given Albuterol/Atrovent and Solu-Medrol.     Past Medical History:  Diagnosis Date  . Abdominal pain   . Anemia   . Diabetes ([place]   . ESRD (end stage renal disease) ([place]   . ESRD on dialysis ([facility] 03/13/2017  . Fatigue   . HIV disease ([place]   . Hyperparathyroidism, secondary ([place]   . Hypertension   . Hypertensive heart disease with end stage renal disease on dialysis ([place] 03/13/2017  . Pulmonary congestion   . SOB (shortness of breath)   . SVT (supraventricular tachycardia) ([place]    ? afib or atrial flutter s/p TEE/DCCV with [place]    Allergies: No Known Allergies  Past Surgical History:  Procedure Laterality Date  . AMPUTATION Left    foot  . RIGHT AV FISTULA PLACEMENT  10/19/2012    Family History  Problem Relation Age  of Onset  . Heart disease Mother   . Diabetes Mother   . Heart disease Father   . Cancer Neg Hx     Social History:  reports that he has never smoked. He has never used smokeless tobacco. He reports that he does not drink alcohol or use drugs.  ROS Pertinent items are noted in HPI.  Blood pressure 123/75, pulse 86, temperature 98.8 F (37.1 C), temperature source Oral, resp. rate (!) 22, height _0  (1.981 m), weight 128.4 kg (283 lb), SpO2 99 %. General appearance: alert, cooperative and appears stated age Eyes: negative Throat: lips, mucosa, and tongue normal; teeth and gums normal Neck: no adenopathy, no carotid bruit, supple, symmetrical,  trachea midline and thyroid not enlarged, symmetric, no tenderness/mass/nodules Back: symmetric, no curvature. ROM normal. No CVA tenderness. Resp: rales bilaterally Chest wall: no tenderness Cardio: regular rate and rhythm, S1, S2 normal, no murmur, click, rub or gallop GI: soft, non-tender; bowel sounds normal; no masses,  no organomegaly Extremities: lt BKA Pulses: 2+ and symmetric Skin: Skin color, texture, turgor normal. No rashes or lesions Lymph nodes: Cervical, supraclavicular, and axillary nodes normal. Neurologic: Grossly normal  Vascular access: Rt CImino w/ strong bruit and not hyperpulsatile  Results for orders placed or performed during the hospital encounter of 05/05/17 (from the past 48 hour(s))  CBC     Status: Abnormal   Collection Time: 05/05/17  4:36 PM  Result Value Ref Range   WBC 12.3 (H) 4.0 - 10.5 K/uL   RBC 3.29 (L) 4.22 - 5.81 MIL/uL   Hemoglobin 9.8 (L) 13.0 - 17.0 g/dL   HCT 30.3 (L) 39.0 - 52.0 %   MCV 92.1 78.0 - 100.0 fL   MCH 29.8 26.0 - 34.0 pg   MCHC 32.3 30.0 - 36.0 g/dL   RDW 16.3 (H) 11.5 - 15.5 %   Platelets 242 150 - 400 K/uL  Brain natriuretic peptide     Status: Abnormal   Collection Time: 05/05/17  4:36 PM  Result Value Ref Range   B Natriuretic Peptide 1,980.3 (H) 0.0 - 100.0 pg/mL  Comprehensive metabolic panel     Status: Abnormal   Collection Time: 05/05/17  4:36 PM  Result Value Ref Range   Sodium 131 (L) 135 - 145 mmol/L   Potassium 4.5 3.5 - 5.1 mmol/L   Chloride 92 (L) 101 - 111 mmol/L   CO2 23 22 - 32 mmol/L   Glucose, Bld 465 (H) 65 - 99 mg/dL   BUN 51 (H) 6 - 20 mg/dL   Creatinine, Ser 10.14 (H) 0.61 - 1.24 mg/dL   Calcium 9.0 8.9 - 10.3 mg/dL   Total Protein 7.9 6.5 - 8.1 g/dL   Albumin 3.5 3.5 - 5.0 g/dL   AST 13 (L) 15 - 41 U/L   ALT 12 (L) 17 - 63 U/L   Alkaline Phosphatase 131 (H) 38 - 126 U/L   Total Bilirubin 1.2 0.3 - 1.2 mg/dL   GFR calc non Af Amer 5 (L) >60 mL/min   GFR calc Af Amer 6 (L) >60 mL/min     Comment: (NOTE) The eGFR has been calculated using the CKD EPI equation. This calculation has not been validated in all clinical situations. eGFR's persistently <60 mL/min signify possible Chronic Kidney Disease.    Anion gap 16 (H) 5 - 15  I-stat troponin, ED     Status: Abnormal   Collection Time: 05/05/17  5:18 PM  Result Value Ref Range  Troponin i, poc 1.07 (HH) 0.00 - 0.08 ng/mL   Comment NOTIFIED PHYSICIAN    Comment 3            Comment: Due to the release kinetics of cTnI, a negative result within the first hours of the onset of symptoms does not rule out myocardial infarction with certainty. If myocardial infarction is still suspected, repeat the test at appropriate intervals.   CBG monitoring, ED     Status: Abnormal   Collection Time: 05/05/17  8:19 PM  Result Value Ref Range   Glucose-Capillary 498 (H) 65 - 99 mg/dL    Dg Chest Portable 1 View  Result Date: 05/05/2017 CLINICAL DATA:  Shortness of breath. EXAM: PORTABLE CHEST 1 VIEW COMPARISON:  None. FINDINGS: 1638 hours. Parahilar central airspace opacity is symmetric. There is fullness in the hilar regions bilaterally. The cardio pericardial silhouette is enlarged. The visualized bony structures of the thorax are intact. Telemetry leads overlie the chest. IMPRESSION: Cardiomegaly with central bilateral parahilar airspace opacity. Imaging features most suggestive of pulmonary edema. Hilar fullness bilaterally. Repeat dedicated PA and lateral films recommended after resolution of acute symptoms. Electronically Signed   By: Misty Stanley M.D.   On: 05/05/2017 16:50     Thomas Melena, MD 05/05/2017, 9:28 PM

## 2017-05-05 NOTE — ED Notes (Signed)
Pt staying at 90% on 5L [place].

## 2017-05-05 NOTE — Progress Notes (Addendum)
ANTICOAGULATION CONSULT NOTE - Initial Consult  Pharmacy Consult for Warfarin Indication: atrial fibrillation  No Known Allergies  Patient Measurements: Height: 6\' 6"  (198.1 cm) Weight:  (stretcher from ED) IBW/kg (Calculated) : 91.4   Vital Signs: Temp: 97.9 F (36.6 C) (05/07 2103) Temp Source: Oral (05/07 2050) BP: 112/65 (05/07 2200) Pulse Rate: 91 (05/07 2200)  Labs:  Recent Labs  05/05/17 1636  HGB 9.8*  HCT 30.3*  PLT 242  CREATININE 10.14*    Estimated Creatinine Clearance: 13.1 mL/min (A) (by C-G formula based on SCr of 10.14 mg/dL (H)).   Medical History: Past Medical History:  Diagnosis Date  . Abdominal pain   . Anemia   . Diabetes ([place])   . ESRD (end stage renal disease) ([place])   . ESRD on dialysis ([place]) 03/13/2017  . Fatigue   . HIV disease ([place])   . Hyperparathyroidism, secondary ([place])   . Hypertension   . Hypertensive heart disease with end stage renal disease on dialysis ([place]) 03/13/2017  . Pulmonary congestion   . SOB (shortness of breath)   . SVT (supraventricular tachycardia) ([place])    ? afib or atrial flutter s/p TEE/DCCV with subsequent ablation due to reoccurrence in [place]    Medications:   (Not in a hospital admission) Scheduled:  . heparin  4,000 Units Dialysis Once in dialysis  . insulin aspart  0-9 Units Subcutaneous Q4H   Infusions:  . sodium chloride    . sodium chloride      Assessment: 51yo male with history of Afib on warfarin PTA presents with dyspnea. Pharmacy is consulted to dose warfarin for atrial fibrillation.   Goal of Therapy:  INR 2-3 Monitor platelets by anticoagulation protocol: Yes   Plan:  Will dose warfarin based on INR Daily INR Monitor s/sx of bleeding  Andrey Cota. Diona Foley, PharmD, BCPS Clinical Pharmacist [phone] 05/05/2017,10:22 PM  _____________________________________________________________ ADDENDUM:  Home medication reconciliation pending - INR 2.64, but now after midnight. Will follow-up  tomorrow and restart warfarin as appropriate.   Georga Bora, PharmD Clinical Pharmacist 05/06/2017 12:52 AM

## 2017-05-05 NOTE — ED Notes (Signed)
Dr. Marvis Repress notified on pt.'s CBG = 498 , ordered Novolog 10 units insulin .

## 2017-05-05 NOTE — ED Triage Notes (Signed)
Per EMS, pt complains of worsening of shob, orhopnea x 2 days. Also complains of CP earlier today, no pain now. Pt has dialysis tue-thur-sat and has not missed any treatments. Pt was 70% on RA and placed on cpap where he remained 88-92% and states "I feel better." Pt given 10 albuterol and 0.5 atrovent, 125 solumedrol.

## 2017-05-05 NOTE — ED Notes (Signed)
Dr. Stark Jock notified of elevated trop

## 2017-05-05 NOTE — H&P (Addendum)
Thomas [name] DXA:[id number] DOB: 1966/10/20 DOA: 05/05/2017     PCP: Patient, No Pcp Per   Outpatient Specialists: cardiology tracy Drusilla Kanner Patient coming from:   home Lives  With family    Chief Complaint: Shortness of breath  HPI: Thomas [name] is a 51 y.o. male with medical history significant of ESRD on HD, HIV, DM 2, A.fib, sp BKA left 2016, HTN  history of SVT and palpitations  Presented with worsening shortness of breath and orthopnea for the past 2 days some occasional chest pain but not now denies missing any dialysis treatments initially patient was satting 70 percent on room air was started on CPAPand started to feel better he was given 10 of albuterol and 0.5 of Atrovent 125 Solu-Medrol .   Originally from [place] and decided to move to [place] - roughly 3 months to be closer to his brother.  Regarding pertinent Chronic problems: HIV compliant with medication  well controlled HIV disease, CD 4 count of 420/VL 30 (march 2018) ESRD on HD T-Th-Sat. For the past 5 years In [place] was diagnosed with possible afib or flutter and had a TEE/DCCV and about 2 weeks later had reoccurrence of the arrhythmia and had an ablation.  Since then he has not had any problems.  He has a CHADS2VASC score of 2 and is on chronic anticoagulation with warfarin.   IN ER:  Temp (24hrs), Avg:98.8 F (37.1 C), Min:98.8 F (37.1 C), Max:98.8 F (37.1 C)    RR 24, 90% Bp 123/76 tro 1/07 WBC 12.3 Hg 9.8 PLt 242  BNP 1980 Na 131 K 4.5 Glucose 465 Cr 10  CXR pulmonary edema Following Medications were ordered in ER: Medications  ondansetron (ZOFRAN) injection 4 mg (4 mg Intravenous Given 05/05/17 1841)  nitroGLYCERIN (NITROSTAT) SL tablet 0.4 mg (0.4 mg Sublingual Given 05/05/17 1908)     ER provider discussed case with:  Nephrology who will arrange for hemodialysis  Hospitalist was called for admission for pulmonary edema and elevated troponin  Review of Systems:    Pertinent  positives include: chest pain, shortness of breath at rest. dyspnea on exertion Constitutional:  No weight loss, night sweats, Fevers, chills, fatigue, weight loss  HEENT:  No headaches, Difficulty swallowing,Tooth/dental problems,Sore throat,  No sneezing, itching, ear ache, nasal congestion, post nasal drip,  Cardio-vascular:  No Orthopnea, PND, anasarca, dizziness, palpitations.no Bilateral lower extremity swelling  GI:  No heartburn, indigestion, abdominal pain, nausea, vomiting, diarrhea, change in bowel habits, loss of appetite, melena, blood in stool, hematemesis Resp:  no  No , No excess mucus, no productive cough, No non-productive cough, No coughing up of blood.No change in color of mucus.No wheezing. Skin:  no rash or lesions. No jaundice GU:  no dysuria, change in color of urine, no urgency or frequency. No straining to urinate.  No flank pain.  Musculoskeletal:  No joint pain or no joint swelling. No decreased range of motion. No back pain.  Psych:  No change in mood or affect. No depression or anxiety. No memory loss.  Neuro: no localizing neurological complaints, no tingling, no weakness, no double vision, no gait abnormality, no slurred speech, no confusion  As per HPI otherwise 10 point review of systems negative.   Past Medical History: Past Medical History:  Diagnosis Date  . Abdominal pain   . Anemia   . Diabetes ([place])   . ESRD (end stage renal disease) ([place])   . ESRD on dialysis ([place]) 03/13/2017  .  Fatigue   . HIV disease ([place])   . Hyperparathyroidism, secondary ([place])   . Hypertension   . Hypertensive heart disease with end stage renal disease on dialysis ([place]) 03/13/2017  . Pulmonary congestion   . SOB (shortness of breath)   . SVT (supraventricular tachycardia) ([place])    ? afib or atrial flutter s/p TEE/DCCV with subsequent ablation due to reoccurrence in [place]   Past Surgical History:  Procedure Laterality Date  . AMPUTATION Left    foot  . RIGHT AV  FISTULA PLACEMENT  10/19/2012     Social History:  Ambulatory  independently    reports that he has never smoked. He has never used smokeless tobacco. He reports that he does not drink alcohol or use drugs.  Allergies:  No Known Allergies     Family History:  Family History  Problem Relation Age of Onset  . Heart disease Mother   . Heart disease Father     Medications: Prior to Admission medications   Medication Sig Start Date End Date Taking? Authorizing Provider  amLODipine (NORVASC) 10 MG tablet Take 10 mg by mouth daily.    [provider]  aspirin EC 81 MG tablet Take 81 mg by mouth daily.    [provider]  cinacalcet (SENSIPAR) 60 MG tablet Take 60 mg by mouth daily.    [provider]  fosamprenavir (LEXIVA) 700 MG tablet Take by mouth 2 (two) times daily.    [provider]  hydrALAZINE (APRESOLINE) 10 MG tablet Take 10 mg by mouth 3 (three) times daily.    [provider]  Insulin Glargine (LANTUS SOLOSTAR) 100 UNIT/ML Solostar Pen Inject 20 Units into the skin daily at 10 pm.    [provider]  lisinopril (PRINIVIL,ZESTRIL) 40 MG tablet Take 40 mg by mouth daily.    [provider]  metoprolol (LOPRESSOR) 100 MG tablet Take 100 mg by mouth daily.    [provider]  Multiple Vitamin (MULTIVITAMIN) tablet Take 1 tablet by mouth daily.    [provider]  raltegravir (ISENTRESS) 400 MG tablet Take 400 mg by mouth 2 (two) times daily.    [provider]  ritonavir (NORVIR) 100 MG capsule Take 100 mg by mouth daily with breakfast.    [provider]  sevelamer carbonate (RENVELA) 800 MG tablet Take 800 mg by mouth 3 (three) times daily with meals.    [provider]  warfarin (COUMADIN) 10 MG tablet Take 10 mg by mouth daily.    [provider]  warfarin (COUMADIN) 7.5 MG tablet Take 7.5 mg by mouth daily. TAKE 1 TABLET BY MOUTH ON Saturday AND Sunday     [provider]    Physical Exam: Patient Vitals for the past 24 hrs:  BP Temp Temp src Pulse Resp SpO2 Height Weight  05/05/17 1930 123/76 - - 90 (!) 24 90 % - -  05/05/17 1915 127/81 - - 91 20 94 % - -  05/05/17 1914 126/84 - - 94 (!) 24 95 % - -  05/05/17 1900 126/84 - - - (!) 21 - - -  05/05/17 1848 140/81 - - 96 (!) 24 90 % - -  05/05/17 1846 (!) 158/82 - - (!) 2 - 93 % - -  05/05/17 1845 140/81 - - 97 - 92 % - -  05/05/17 1830 (!) 128/100 - - (!) 107 (!) 23 (!) 82 % - -  05/05/17 1815 (!) 137/96 - - 93  20 95 % - -  05/05/17 1800 132/80 - - 91 (!) 24 94 % - -  05/05/17 1753 - - - 96 13 (!) 83 % - -  05/05/17 1745 128/85 - - 93 (!) 25 (!) 87 % - -  05/05/17 1730 134/84 - - 92 (!) 28 92 % - -  05/05/17 1729 127/83 - - 91 (!) 25 92 % - -  05/05/17 1715 127/83 - - 94 (!) 30 90 % - -  05/05/17 1700 129/83 - - 93 (!) 28 93 % - -  05/05/17 1645 130/80 - - 93 (!) 31 92 % - -  05/05/17 1643 - - - - - 94 % - -  05/05/17 1630 139/80 - - 93 (!) 23 92 % - -  05/05/17 1627 (!) 152/83 98.8 F (37.1 C) Oral 92 (!) 28 96 % 6\' 6"  (1.981 m) 128.4 kg (283 lb)  05/05/17 1622 - - - - - 92 % - -    1. General:  in No Acute distress 2. Psychological: Alert and   Oriented 3. Head/ENT:   Moist  Mucous Membranes                          Head Non traumatic, neck supple                          Poor Dentition 4. SKIN:   decreased Skin turgor,  Skin clean Dry and intact no rash 5. Heart: Regular rate and rhythm no  Murmur, Rub or gallop 6. Lungs:  no wheezes some crackles   7. Abdomen: Soft,  non-tender, Non distended 8. Lower extremities: no clubbing, cyanosis, or edema 9. Neurologically Grossly intact, moving all 4 extremities equally  Sp Left BKA 10. MSK: Normal range of motion   body mass index is 32.7 kg/m.  Labs on Admission:   Labs on Admission: I have personally reviewed following labs and imaging studies  CBC:  Recent Labs Lab 05/05/17 1636  WBC 12.3*  HGB 9.8*  HCT  30.3*  MCV 92.1  PLT 425   Basic Metabolic Panel:  Recent Labs Lab 05/05/17 1636  NA 131*  K 4.5  CL 92*  CO2 23  GLUCOSE 465*  BUN 51*  CREATININE 10.14*  CALCIUM 9.0   GFR: Estimated Creatinine Clearance: 13.1 mL/min (A) (by C-G formula based on SCr of 10.14 mg/dL (H)). Liver Function Tests:  Recent Labs Lab 05/05/17 1636  AST 13*  ALT 12*  ALKPHOS 131*  BILITOT 1.2  PROT 7.9  ALBUMIN 3.5   No results for input(s): LIPASE, AMYLASE in the last 168 hours. No results for input(s): AMMONIA in the last 168 hours. Coagulation Profile: No results for input(s): INR, PROTIME in the last 168 hours. Cardiac Enzymes: No results for input(s): CKTOTAL, CKMB, CKMBINDEX, TROPONINI in the last 168 hours. BNP (last 3 results) No results for input(s): PROBNP in the last 8760 hours. HbA1C: No results for input(s): HGBA1C in the last 72 hours. CBG: No results for input(s): GLUCAP in the last 168 hours. Lipid Profile: No results for input(s): CHOL, HDL, LDLCALC, TRIG, CHOLHDL, LDLDIRECT in the last 72 hours. Thyroid Function Tests: No results for input(s): TSH, T4TOTAL, FREET4, T3FREE, THYROIDAB in the last 72 hours. Anemia Panel: No results for input(s): VITAMINB12, FOLATE, FERRITIN, TIBC, IRON, RETICCTPCT in the last 72 hours. Urine analysis: No results found for: COLORURINE, APPEARANCEUR, [place],  PHURINE, GLUCOSEU, HGBUR, BILIRUBINUR, KETONESUR, PROTEINUR, UROBILINOGEN, NITRITE, LEUKOCYTESUR Sepsis Labs: @LABRCNTIP (procalcitonin:4,lacticidven:4) )No results found for this or any previous visit (from the past 240 hour(s)).     UA not ordered  No results found for: HGBA1C  Estimated Creatinine Clearance: 13.1 mL/min (A) (by C-G formula based on SCr of 10.14 mg/dL (H)).  BNP (last 3 results) No results for input(s): PROBNP in the last 8760 hours.   ECG REPORT  Independently reviewed Rate: 96  Rhythm: NSR ST&T Change: No acute ischemic changes  [id number]  Filed  Weights   05/05/17 1627  Weight: 128.4 kg (283 lb)     Cultures: No results found for: SDES, SPECREQUEST, CULT, REPTSTATUS   Radiological Exams on Admission: Dg Chest Portable 1 View  Result Date: 05/05/2017 CLINICAL DATA:  Shortness of breath. EXAM: PORTABLE CHEST 1 VIEW COMPARISON:  None. FINDINGS: 1638 hours. Parahilar central airspace opacity is symmetric. There is fullness in the hilar regions bilaterally. The cardio pericardial silhouette is enlarged. The visualized bony structures of the thorax are intact. Telemetry leads overlie the chest. IMPRESSION: Cardiomegaly with central bilateral parahilar airspace opacity. Imaging features most suggestive of pulmonary edema. Hilar fullness bilaterally. Repeat dedicated PA and lateral films recommended after resolution of acute symptoms. Electronically Signed   By: Misty Stanley M.D.   On: 05/05/2017 16:50    Chart has been reviewed    Assessment/Plan  51 y.o. male with medical history significant of ESRD on HD, HIV, DM 2, A.fib, sp BKA left 2016, HTN  history of SVT and palpitations admitted for fluid overload and elevated troponin Present on Admission: . Pulmonary edema we'll initiate hemodialysis patient have had similar episodes in the past he felt that he have had last taken off during the last dialysis . ESRD (end stage renal disease) ([place]) on HD Tuesday Thursday Saturday required dialysis today secondary to fluid overload appreciate nephrology consult shape . HIV disease ([place]) stable continue home medications . Type 2 diabetes mellitus with complication [facility]) poorly controlled will do diabetes coordinator consult and sliding scale . Fluid overload hemodialysis today and follow. Echogram . Elevated troponin - we will continue to cycle likely secondary to demand but given intermittent chest pain would benefit from echo gram and cardiology evaluation History of A. Fib - .Chronic Atrial fibrillation ([facility])        - CHA2DS2 vas score 3  : continue current anticoagulation with  Coumadin per pharmacy,            -  Rate control:  Currently controlled with  Toprolol,   will continue          Other plan as per orders.  DVT prophylaxis:  coumadin  Code Status:  FULL CODE   as per patient    Family Communication:   Family   at  Bedside  plan of care was discussed with  Wife,   Disposition Plan:    To home once workup is complete and patient is stable                              Consults called: nephrology  Admission status:   inpatient     Level of care      SDU      I have spent a total of 56 min on this admission   Thomas [name] 05/05/2017, 10:14 PM    [facility]  Pager [phone]   after 2 AM please page floor  coverage PA If 7AM-7PM, please contact the day team taking care of the patient  [email or web address]  Password [email or web address]

## 2017-05-05 NOTE — ED Notes (Signed)
This Rn in room, pt coughing up sputum and nauseous. Pt removed NRB mask due to nausea and sat at 80%. Resp and MD contacted and in room, pt to be placed on bipap. NRB back in place. Zofran ordered.

## 2017-05-06 ENCOUNTER — Inpatient Hospital Stay (HOSPITAL_COMMUNITY): Payer: Medicare Other

## 2017-05-06 DIAGNOSIS — Z992 Dependence on renal dialysis: Secondary | ICD-10-CM

## 2017-05-06 DIAGNOSIS — N186 End stage renal disease: Secondary | ICD-10-CM

## 2017-05-06 DIAGNOSIS — R7989 Other specified abnormal findings of blood chemistry: Secondary | ICD-10-CM

## 2017-05-06 DIAGNOSIS — I1311 Hypertensive heart and chronic kidney disease without heart failure, with stage 5 chronic kidney disease, or end stage renal disease: Secondary | ICD-10-CM

## 2017-05-06 DIAGNOSIS — J9601 Acute respiratory failure with hypoxia: Secondary | ICD-10-CM

## 2017-05-06 DIAGNOSIS — E8779 Other fluid overload: Secondary | ICD-10-CM

## 2017-05-06 DIAGNOSIS — R748 Abnormal levels of other serum enzymes: Secondary | ICD-10-CM

## 2017-05-06 DIAGNOSIS — B20 Human immunodeficiency virus [HIV] disease: Secondary | ICD-10-CM

## 2017-05-06 LAB — PROTIME-INR
INR: 2.87
Prothrombin Time: 30.7 seconds — ABNORMAL HIGH (ref 11.4–15.2)

## 2017-05-06 LAB — GLUCOSE, RANDOM: Glucose, Bld: 557 mg/dL (ref 65–99)

## 2017-05-06 LAB — MRSA PCR SCREENING: MRSA by PCR: NEGATIVE

## 2017-05-06 LAB — TROPONIN I
TROPONIN I: 0.82 ng/mL — AB (ref <0.03)
TROPONIN I: 0.85 ng/mL — AB (ref <0.03)
TROPONIN I: 0.8 ng/mL — AB (ref <0.03)

## 2017-05-06 LAB — CBC
HCT: 28.4 % — ABNORMAL LOW (ref 39.0–52.0)
Hemoglobin: 9.2 g/dL — ABNORMAL LOW (ref 13.0–17.0)
MCH: 29.7 pg (ref 26.0–34.0)
MCHC: 32.4 g/dL (ref 30.0–36.0)
MCV: 91.6 fL (ref 78.0–100.0)
Platelets: 230 10*3/uL (ref 150–400)
RBC: 3.1 MIL/uL — ABNORMAL LOW (ref 4.22–5.81)
RDW: 16.4 % — AB (ref 11.5–15.5)
WBC: 8.3 10*3/uL (ref 4.0–10.5)

## 2017-05-06 LAB — COMPREHENSIVE METABOLIC PANEL
ALK PHOS: 124 U/L (ref 38–126)
ALT: 13 U/L — AB (ref 17–63)
AST: 16 U/L (ref 15–41)
Albumin: 3.1 g/dL — ABNORMAL LOW (ref 3.5–5.0)
Anion gap: 15 (ref 5–15)
BILIRUBIN TOTAL: 1.3 mg/dL — AB (ref 0.3–1.2)
BUN: 40 mg/dL — AB (ref 6–20)
CALCIUM: 9.2 mg/dL (ref 8.9–10.3)
CO2: 25 mmol/L (ref 22–32)
Chloride: 92 mmol/L — ABNORMAL LOW (ref 101–111)
Creatinine, Ser: 7.31 mg/dL — ABNORMAL HIGH (ref 0.61–1.24)
GFR calc Af Amer: 9 mL/min — ABNORMAL LOW (ref >60)
GFR calc non Af Amer: 8 mL/min — ABNORMAL LOW (ref >60)
GLUCOSE: 556 mg/dL — AB (ref 65–99)
Potassium: 4.3 mmol/L (ref 3.5–5.1)
Sodium: 132 mmol/L — ABNORMAL LOW (ref 135–145)
TOTAL PROTEIN: 7.8 g/dL (ref 6.5–8.1)

## 2017-05-06 LAB — GLUCOSE, CAPILLARY
GLUCOSE-CAPILLARY: 256 mg/dL — AB (ref 65–99)
GLUCOSE-CAPILLARY: 443 mg/dL — AB (ref 65–99)
GLUCOSE-CAPILLARY: 443 mg/dL — AB (ref 65–99)
Glucose-Capillary: 556 mg/dL (ref 65–99)
Glucose-Capillary: 338 mg/dL — ABNORMAL HIGH (ref 65–99)
Glucose-Capillary: 470 mg/dL — ABNORMAL HIGH (ref 65–99)

## 2017-05-06 LAB — RENAL FUNCTION PANEL
ANION GAP: 17 — AB (ref 5–15)
Albumin: 3.2 g/dL — ABNORMAL LOW (ref 3.5–5.0)
BUN: 58 mg/dL — ABNORMAL HIGH (ref 6–20)
CHLORIDE: 91 mmol/L — AB (ref 101–111)
CO2: 22 mmol/L (ref 22–32)
Calcium: 8.8 mg/dL — ABNORMAL LOW (ref 8.9–10.3)
Creatinine, Ser: 10.28 mg/dL — ABNORMAL HIGH (ref 0.61–1.24)
GFR calc non Af Amer: 5 mL/min — ABNORMAL LOW (ref >60)
GFR, EST AFRICAN AMERICAN: 6 mL/min — AB (ref >60)
Glucose, Bld: 575 mg/dL (ref 65–99)
POTASSIUM: 5.9 mmol/L — AB (ref 3.5–5.1)
Phosphorus: 6.3 mg/dL — ABNORMAL HIGH (ref 2.5–4.6)
Sodium: 130 mmol/L — ABNORMAL LOW (ref 135–145)

## 2017-05-06 LAB — T-HELPER CELLS (CD4) COUNT (NOT AT ARMC)
CD4 T CELL ABS: 310 /uL — AB (ref 400–2700)
CD4 T CELL HELPER: 36 % (ref 33–55)

## 2017-05-06 LAB — MAGNESIUM: Magnesium: 2.1 mg/dL (ref 1.7–2.4)

## 2017-05-06 LAB — PHOSPHORUS: Phosphorus: 4.6 mg/dL (ref 2.5–4.6)

## 2017-05-06 LAB — HEMOGLOBIN A1C
Hgb A1c MFr Bld: 11.7 % — ABNORMAL HIGH (ref 4.8–5.6)
Mean Plasma Glucose: 289 mg/dL

## 2017-05-06 LAB — TSH: TSH: 0.52 u[IU]/mL (ref 0.350–4.500)

## 2017-05-06 MED ORDER — SODIUM CHLORIDE 0.9% FLUSH
3.0000 mL | Freq: Two times a day (BID) | INTRAVENOUS | Status: DC
  Administered 2017-05-06 – 2017-05-08 (×4): 3 mL via INTRAVENOUS

## 2017-05-06 MED ORDER — RITONAVIR 100 MG PO TABS
100.0000 mg | ORAL_TABLET | Freq: Every day | ORAL | Status: DC
  Administered 2017-05-06 – 2017-05-07 (×2): 100 mg via ORAL
  Filled 2017-05-06 (×3): qty 1

## 2017-05-06 MED ORDER — ACETAMINOPHEN 650 MG RE SUPP: 650.0000 mg | Freq: Four times a day (QID) | RECTAL | Status: DC | PRN

## 2017-05-06 MED ORDER — ACETAMINOPHEN 325 MG PO TABS
650.0000 mg | ORAL_TABLET | Freq: Four times a day (QID) | ORAL | Status: DC | PRN
  Administered 2017-05-07 (×2): 650 mg via ORAL
  Filled 2017-05-06 (×2): qty 2

## 2017-05-06 MED ORDER — AMLODIPINE BESYLATE 10 MG PO TABS
10.0000 mg | ORAL_TABLET | Freq: Every day | ORAL | Status: DC
  Administered 2017-05-06 – 2017-05-08 (×3): 10 mg via ORAL
  Filled 2017-05-06 (×3): qty 1

## 2017-05-06 MED ORDER — SENNA 8.6 MG PO TABS
1.0000 | ORAL_TABLET | Freq: Two times a day (BID) | ORAL | Status: DC
  Administered 2017-05-06 – 2017-05-07 (×3): 8.6 mg via ORAL
  Filled 2017-05-06 (×4): qty 1

## 2017-05-06 MED ORDER — INSULIN ASPART 100 UNIT/ML ~~LOC~~ SOLN
0.0000 [IU] | Freq: Every day | SUBCUTANEOUS | Status: DC
  Administered 2017-05-07: 3 [IU] via SUBCUTANEOUS

## 2017-05-06 MED ORDER — CINACALCET HCL 30 MG PO TABS
60.0000 mg | ORAL_TABLET | Freq: Every day | ORAL | Status: DC
  Administered 2017-05-06 – 2017-05-08 (×3): 60 mg via ORAL
  Filled 2017-05-06 (×3): qty 2

## 2017-05-06 MED ORDER — INSULIN ASPART 100 UNIT/ML ~~LOC~~ SOLN
12.0000 [IU] | Freq: Once | SUBCUTANEOUS | Status: AC
  Administered 2017-05-06: 12 [IU] via SUBCUTANEOUS

## 2017-05-06 MED ORDER — WARFARIN SODIUM 10 MG PO TABS
10.0000 mg | ORAL_TABLET | Freq: Once | ORAL | Status: AC
  Administered 2017-05-06: 10 mg via ORAL
  Filled 2017-05-06: qty 1

## 2017-05-06 MED ORDER — ONDANSETRON HCL 4 MG/2ML IJ SOLN: 4.0000 mg | Freq: Four times a day (QID) | INTRAMUSCULAR | Status: DC | PRN

## 2017-05-06 MED ORDER — INSULIN ASPART 100 UNIT/ML ~~LOC~~ SOLN
5.0000 [IU] | Freq: Three times a day (TID) | SUBCUTANEOUS | Status: DC
  Administered 2017-05-06 – 2017-05-07 (×3): 5 [IU] via SUBCUTANEOUS

## 2017-05-06 MED ORDER — HYDRALAZINE HCL 10 MG PO TABS
10.0000 mg | ORAL_TABLET | Freq: Three times a day (TID) | ORAL | Status: DC
  Administered 2017-05-06 – 2017-05-08 (×6): 10 mg via ORAL
  Filled 2017-05-06 (×6): qty 1

## 2017-05-06 MED ORDER — SODIUM CHLORIDE 0.9% FLUSH
3.0000 mL | Freq: Two times a day (BID) | INTRAVENOUS | Status: DC
  Administered 2017-05-06 – 2017-05-07 (×3): 3 mL via INTRAVENOUS

## 2017-05-06 MED ORDER — ASPIRIN EC 81 MG PO TBEC
81.0000 mg | DELAYED_RELEASE_TABLET | Freq: Every day | ORAL | Status: DC
  Administered 2017-05-06 – 2017-05-08 (×3): 81 mg via ORAL
  Filled 2017-05-06 (×3): qty 1

## 2017-05-06 MED ORDER — FOSAMPRENAVIR CALCIUM 700 MG PO TABS: 700.0000 mg | ORAL_TABLET | Freq: Two times a day (BID) | ORAL | Status: DC

## 2017-05-06 MED ORDER — LISINOPRIL 40 MG PO TABS
40.0000 mg | ORAL_TABLET | Freq: Every day | ORAL | Status: DC
  Administered 2017-05-06 – 2017-05-08 (×3): 40 mg via ORAL
  Filled 2017-05-06 (×3): qty 1

## 2017-05-06 MED ORDER — INSULIN ASPART 100 UNIT/ML ~~LOC~~ SOLN
20.0000 [IU] | Freq: Once | SUBCUTANEOUS | Status: AC
  Administered 2017-05-06: 20 [IU] via SUBCUTANEOUS

## 2017-05-06 MED ORDER — BISACODYL 10 MG RE SUPP: 10.0000 mg | Freq: Every day | RECTAL | Status: DC | PRN

## 2017-05-06 MED ORDER — INSULIN GLARGINE 100 UNIT/ML ~~LOC~~ SOLN
20.0000 [IU] | Freq: Every day | SUBCUTANEOUS | Status: DC
  Administered 2017-05-06 (×2): 20 [IU] via SUBCUTANEOUS
  Filled 2017-05-06 (×2): qty 0.2

## 2017-05-06 MED ORDER — RALTEGRAVIR POTASSIUM 400 MG PO TABS
400.0000 mg | ORAL_TABLET | Freq: Two times a day (BID) | ORAL | Status: DC
  Administered 2017-05-06 – 2017-05-08 (×5): 400 mg via ORAL
  Filled 2017-05-06 (×6): qty 1

## 2017-05-06 MED ORDER — POLYETHYLENE GLYCOL 3350 17 G PO PACK: 17.0000 g | PACK | Freq: Every day | ORAL | Status: DC | PRN

## 2017-05-06 MED ORDER — SODIUM CHLORIDE 0.9 % IV SOLN: 250.0000 mL | INTRAVENOUS | Status: DC | PRN

## 2017-05-06 MED ORDER — LOPERAMIDE HCL 2 MG PO CAPS
2.0000 mg | ORAL_CAPSULE | ORAL | Status: DC | PRN
  Administered 2017-05-06 – 2017-05-08 (×2): 2 mg via ORAL
  Filled 2017-05-06 (×3): qty 1

## 2017-05-06 MED ORDER — INSULIN ASPART 100 UNIT/ML ~~LOC~~ SOLN
0.0000 [IU] | Freq: Three times a day (TID) | SUBCUTANEOUS | Status: DC
  Administered 2017-05-06: 11 [IU] via SUBCUTANEOUS
  Administered 2017-05-06: 15 [IU] via SUBCUTANEOUS
  Administered 2017-05-06: 8 [IU] via SUBCUTANEOUS
  Administered 2017-05-07: 15 [IU] via SUBCUTANEOUS

## 2017-05-06 MED ORDER — ONDANSETRON HCL 4 MG PO TABS: 4.0000 mg | ORAL_TABLET | Freq: Four times a day (QID) | ORAL | Status: DC | PRN

## 2017-05-06 MED ORDER — SODIUM CHLORIDE 0.9% FLUSH: 3.0000 mL | INTRAVENOUS | Status: DC | PRN

## 2017-05-06 MED ORDER — SEVELAMER CARBONATE 800 MG PO TABS
800.0000 mg | ORAL_TABLET | Freq: Three times a day (TID) | ORAL | Status: DC
  Administered 2017-05-06 – 2017-05-08 (×7): 800 mg via ORAL
  Filled 2017-05-06 (×6): qty 1

## 2017-05-06 MED ORDER — METOPROLOL TARTRATE 100 MG PO TABS
100.0000 mg | ORAL_TABLET | Freq: Every day | ORAL | Status: DC
  Administered 2017-05-06 – 2017-05-08 (×3): 100 mg via ORAL
  Filled 2017-05-06 (×3): qty 1

## 2017-05-06 NOTE — Progress Notes (Addendum)
Inpatient Diabetes Program Recommendations  AACE/ADA: New Consensus Statement on Inpatient Glycemic Control (2015)  Target Ranges:  Prepandial:   less than 140 mg/dL      Peak postprandial:   less than 180 mg/dL (1-2 hours)      Critically ill patients:  140 - 180 mg/dL   Lab Results  Component Value Date   GLUCAP 470 (H) 05/06/2017    Review of Glycemic Control:  Results for Thomas [name], Thomas [name] (MRN [id number]) as of 05/06/2017 14:01  Ref. Range 05/05/2017 20:19 05/06/2017 00:48 05/06/2017 03:39 05/06/2017 08:59 05/06/2017 11:44  Glucose-Capillary Latest Ref Range: 65 - 99 mg/dL 498 (H) 443 (H) 556 (HH) 338 (H) 470 (H)   Diabetes history: Type 2 diabetes Outpatient Diabetes medications: Lantus 10 units q HS  Current orders for Inpatient glycemic control:  Novolog moderate tid with meals and HS, Novolog 5 units tid with meals, Lantus 20 units q HS  Inpatient Diabetes Program Recommendations:    Spoke with patient regarding home diabetes management.  He states that his blood sugars normally run "290-350 mg/d).  He checks his blood sugars every other day.  He does not have a PCP in [place].  Need improved control of blood sugars.  I discussed that this would require more monitoring and likely more often insulin injections.  Agree with current orders.  Will follow.  Patient did not seem engaged at this point in making changes to diabetes management.  Will continue to follow in the hospital. Although patient states that he is hoping to go home today. He needs local MD to assist in management of diabetes.   Thanks, Adah Perl, RN, BC-ADM Inpatient Diabetes Coordinator Pager [phone] (8a-5p)

## 2017-05-06 NOTE — Progress Notes (Signed)
  [facility] Progress Note   Assessment/ Plan:   1 Volume overload - according to ED weights he is about 7kg up on his EDW but it's not a floor weight so i'm not convinced; however, he is certainly volume overloaded by history and on physical exam.  Improving but needs treatment today. 2 ESRD: TTS, providing HD on schedule today 3 Hypertension: Controlled; we will need to monitor given the UF today and tomorrow; may need to hold meds to permit challenging of EDW. Per pt he never cramps. 4. Anemia of ESRD: Stable; last Mircera 150 q2weeks given on 5/3. 5. Metabolic Bone Disease: Phos in low 5's and PTH trending down 1035 --> 780 from Mar to April 2018. 6. r/o angina- chest pain improved after HD, trops elevated at 0.8 7. DM- blood glucoses in the 400s-500s here 8. HIV- adherent to treatment 9. h/o possible afib w/ ablation on Coumadin.   Dialyzes at [place] on TTS 2nd shift; on HD for past 5 years Primary Nephrologist Dr. Lorrene Reid EDW 120.5kg HD [place] 3/2.25 Heparin 5000 unit bolus Access Rt [place]  Subjective:    Feels better today.  Still on supplemental O2   Objective:   BP 133/85 (BP Location: Left Arm)   Pulse (!) 104   Temp 99 F (37.2 C) (Oral)   Resp 20   Ht 6\' 6"  (1.981 m)   Wt 125.2 kg (276 lb 1.6 oz)   SpO2 92%   BMI 31.91 kg/m   Physical Exam: Gen:NAD, sleeping but easily arousable CVS: RRR soft systolic murmur Resp: bilateral inspiratory crackles YIR:[id number] nontender Ext: s/p L BKA. 1+ LE edema ACCESS: R FA fistula + T/B  Labs: BMET  Recent Labs Lab 05/05/17 1636 05/05/17 2332 05/06/17 0429  NA 131* 130* 132*  K 4.5 5.9* 4.3  CL 92* 91* 92*  CO2 23 22 25   GLUCOSE 465* 575* 556*  BUN 51* 58* 40*  CREATININE 10.14* 10.28* 7.31*  CALCIUM 9.0 8.8* 9.2  PHOS  --  6.3* 4.6   CBC  Recent Labs Lab 05/05/17 1636 05/05/17 2332 05/06/17 0429  WBC 12.3* 12.2* 8.3  HGB 9.8* 9.1* 9.2*  HCT 30.3* 27.8* 28.4*  MCV 92.1 92.4 91.6   PLT 242 243 230    @IMGRELPRIORS @ Medications:    . amLODipine  10 mg Oral Daily  . aspirin EC  81 mg Oral Daily  . cinacalcet  60 mg Oral Q breakfast  . fosamprenavir  700 mg Oral BID  . heparin  4,000 Units Dialysis Once in dialysis  . hydrALAZINE  10 mg Oral TID  . insulin aspart  0-9 Units Subcutaneous Q4H  . insulin glargine  20 Units Subcutaneous Q2200  . lisinopril  40 mg Oral Daily  . metoprolol  100 mg Oral Daily  . raltegravir  400 mg Oral BID  . ritonavir  100 mg Oral Q breakfast  . senna  1 tablet Oral BID  . sevelamer carbonate  800 mg Oral TID WC  . sodium chloride flush  3 mL Intravenous Q12H  . sodium chloride flush  3 mL Intravenous Q12H  . Warfarin - Pharmacist Dosing Inpatient   Does not apply [place], MD [facility] 05/06/2017, 8:45 AM

## 2017-05-06 NOTE — Progress Notes (Signed)
PROGRESS NOTE    Thomas [name]  DJM:[id number] DOB: 06/08/1966 DOA: 05/05/2017 PCP: Patient, No Pcp Per    Brief Narrative: Thomas [name] is a 51 y.o. male with medical history significant of ESRD on HD, HIV, DM 2, A.fib, sp BKA left 2016, HTN. history of SVT and palpitations,  Presented with worsening shortness of breath and orthopnea for the past 2 days. He was found to be fluid overloaded, hypoxic, requiring oxygen  and admitted for HD.   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  Has not missed HD .  Plan for HD today.  Try to wean him off the oxygen.  Appreciate nephrology and cardiology recommendations.    Type 2 DM:  Not well controlled.  CBG (last 3)   Recent Labs  05/06/17 0859 05/06/17 1144 05/06/17 1647  GLUCAP 338* 470* 256*    Added novolog 5 units tidac.  Get HGBA1C.    HYPERTENSION;  Sub optimal.  Adjusted meds.    HIV disease:  Resume hom meds.   Chronic afib: rate controlled. Resume coumadin as per pharmacy. Therapeutic INR.    Elevated troponin probably from demand ischemia from fluid overload.  No further intervention at this time.  No chest pain.       DVT prophylaxis: COUMADIN) Code Status: (FULL CODE.  Family Communication: none at bedside.  Disposition Plan: possibly home when able to wean him off oxygen and sob improves.   Consultants:  RENAL AND CARDIOLOGY.   Procedures: HD   Antimicrobials:none.    Subjective: Wants to go home as soon  As possible. No chest pain, but sob.   Objective: Vitals:   05/06/17 1630 05/06/17 1700 05/06/17 1726 05/06/17 1818  BP: 117/85 (!) 142/76  108/79 (!) 138/113  Pulse: (!) 128 (!) 126 (!) 130 (!) 125  Resp: (!) 21 (!) 23 (!) 21 (!) 24  Temp:   97.8 F (36.6 C)   TempSrc:   Oral   SpO2:   94% 96%  Weight:   118.9 kg (262 lb 2 oz)   Height:        Intake/Output Summary (Last 24 hours) at 05/06/17 1838 Last data filed at 05/06/17 1726  Gross per 24 hour  Intake              366 ml  Output             7500 ml  Net            -7134 ml   Filed Weights   05/06/17 0040 05/06/17 1426 05/06/17 1726  Weight: 125.2 kg (276 lb 1.6 oz) (P) 123.6 kg (272 lb 7.8 oz) 118.9 kg (262 lb 2 oz)    Examination:  General exam: Appears calm and comfortable on 3 lit of [place] oxygen Respiratory system: bilateral crackles at bases, no wheezing heard.  Cardiovascular system: S1 & S2 heard, RRR. No JVD, murmurs, rubs, gallops or clicks. No pedal edema. Gastrointestinal system: Abdomen is nondistended, soft and nontender. No organomegaly or masses felt. Normal bowel sounds heard. Central nervous system: Alert and oriented. No focal neurological deficits. Extremities: Symmetric 5  x 5 power. Skin: No rashes, lesions or ulcers Psychiatry: Judgement and insight appear normal. Mood & affect appropriate.     Data Reviewed: I have personally reviewed following labs and imaging studies  CBC:  Recent Labs Lab 05/05/17 1636 05/05/17 2332 05/06/17 0429  WBC 12.3* 12.2* 8.3  HGB 9.8* 9.1* 9.2*  HCT 30.3* 27.8* 28.4*  MCV 92.1 92.4 91.6  PLT 242 243 678   Basic Metabolic Panel:  Recent Labs Lab 05/05/17 1636 05/05/17 2332 05/06/17 0429  NA 131* 130* 132*  K 4.5 5.9* 4.3  CL 92* 91* 92*  CO2 23 22 25   GLUCOSE 465* 575* 556*  BUN 51* 58* 40*  CREATININE 10.14* 10.28* 7.31*  CALCIUM 9.0 8.8* 9.2  MG  --   --  2.1  PHOS  --  6.3* 4.6   GFR: Estimated Creatinine Clearance: 17.5 mL/min (A) (by C-G formula based on SCr of 7.31 mg/dL (H)). Liver Function Tests:  Recent Labs Lab 05/05/17 1636 05/05/17 2332 05/06/17 0429  AST 13*  --   16  ALT 12*  --  13*  ALKPHOS 131*  --  124  BILITOT 1.2  --  1.3*  PROT 7.9  --  7.8  ALBUMIN 3.5 3.2* 3.1*   No results for input(s): LIPASE, AMYLASE in the last 168 hours. No results for input(s): AMMONIA in the last 168 hours. Coagulation Profile:  Recent Labs Lab 05/05/17 2240 05/06/17 0429  INR 2.64 2.87   Cardiac Enzymes:  Recent Labs Lab 05/05/17 2332 05/06/17 0429 05/06/17 1104  TROPONINI 0.85* 0.82* 0.80*   BNP (last 3 results) No results for input(s): PROBNP in the last 8760 hours. HbA1C: No results for input(s): HGBA1C in the last 72 hours. CBG:  Recent Labs Lab 05/06/17 0048 05/06/17 0339 05/06/17 0859 05/06/17 1144 05/06/17 1647  GLUCAP 443* 556* 338* 470* 256*   Lipid Profile: No results for input(s): CHOL, HDL, LDLCALC, TRIG, CHOLHDL, LDLDIRECT in the last 72 hours. Thyroid Function Tests:  Recent Labs  05/06/17 0432  TSH 0.520   Anemia Panel: No results for input(s): VITAMINB12, FOLATE, FERRITIN, TIBC, IRON, RETICCTPCT in the last 72 hours. Sepsis Labs: No results for input(s): PROCALCITON, LATICACIDVEN in the last 168 hours.  Recent Results (from the past 240 hour(s))  MRSA PCR Screening     Status: None   Collection Time: 05/06/17 12:45 AM  Result Value Ref Range Status   MRSA by PCR NEGATIVE NEGATIVE Final    Comment:        The GeneXpert MRSA Assay (FDA approved for NASAL specimens only), is one component of a comprehensive MRSA colonization surveillance program. It is not intended to diagnose MRSA infection nor to guide or monitor treatment for MRSA infections.          Radiology Studies: Dg Chest Portable 1 View  Result Date: 05/05/2017 CLINICAL DATA:  Shortness of breath. EXAM: PORTABLE CHEST 1 VIEW COMPARISON:  None. FINDINGS: 1638 hours. Parahilar central airspace opacity is symmetric. There is fullness in the hilar regions bilaterally. The cardio pericardial silhouette is enlarged. The visualized bony  structures of the thorax are intact. Telemetry leads overlie the chest. IMPRESSION: Cardiomegaly with central bilateral parahilar airspace opacity. Imaging features most suggestive of pulmonary edema. Hilar fullness bilaterally. Repeat dedicated PA and lateral films recommended after resolution of acute symptoms. Electronically Signed   By: Misty Stanley M.D.   On: 05/05/2017 16:50        Scheduled Meds: . amLODipine  10 mg  Oral Daily  . aspirin EC  81 mg Oral Daily  . cinacalcet  60 mg Oral Q breakfast  . fosamprenavir  700 mg Oral BID  . heparin  4,000 Units Dialysis Once in dialysis  . hydrALAZINE  10 mg Oral TID  . insulin aspart  0-15 Units Subcutaneous TID WC  . insulin aspart  0-5 Units Subcutaneous QHS  . insulin aspart  5 Units Subcutaneous TID WC  . insulin glargine  20 Units Subcutaneous Q2200  . lisinopril  40 mg Oral Daily  . metoprolol  100 mg Oral Daily  . raltegravir  400 mg Oral BID  . ritonavir  100 mg Oral Q breakfast  . senna  1 tablet Oral BID  . sevelamer carbonate  800 mg Oral TID WC  . sodium chloride flush  3 mL Intravenous Q12H  . sodium chloride flush  3 mL Intravenous Q12H  . Warfarin - Pharmacist Dosing Inpatient   Does not apply q1800   Continuous Infusions: . sodium chloride    . sodium chloride    . sodium chloride       LOS: 1 day    Time spent: 35 minutes.     Hosie Poisson, MD [facility] Pager [phone] If 7PM-7AM, please contact night-coverage [email or web address] Password [facility] 05/06/2017, 6:38 PM

## 2017-05-06 NOTE — Procedures (Signed)
Patient seen and examined on Hemodialysis. QB 400, UF goal 4L as tolerated.    Pt is feeling well and is without complaint at this time.    Cardiology has seen him and has signed off.    We are dialyzing again for volume and if can get off O2 at the end of treatment can d/c from a renal perspective.  Treatment adjusted as needed.  Madelon Lips MD [facility] 3:22 PM

## 2017-05-06 NOTE — Plan of Care (Signed)
Problem: Safety: Goal: Ability to remain free from injury will improve Outcome: Progressing Patient is a LeftBKA, and uses a prostetic limb, Patient will call out when he needs to get up for the restroom . Call bell at bedside.

## 2017-05-06 NOTE — Progress Notes (Signed)
Provider on call notified CBG 556. Per Protocol, STAT Glucose ordered. Pt's CBG's have been >400 since arrival to ED. Pt has received SSI, and also Scheduled Lantus 20 units per orders. Pt arrived to unit post HD at approx 0100 and ate late meal. Awaiting return call/orders.

## 2017-05-06 NOTE — Progress Notes (Addendum)
ANTICOAGULATION CONSULT NOTE -  Follow up  Pharmacy Consult for Coumadin  Indication: atrial fibrillation  No Known Allergies  Patient Measurements: Height: 6\' 6"  (198.1 cm) Weight: 276 lb 1.6 oz (125.2 kg) IBW/kg (Calculated) : 91.4 Heparin Dosing Weight: 117.5 kg  Vital Signs: Temp: 98.8 F (37.1 C) (05/08 1117) Temp Source: Oral (05/08 1117) BP: 140/93 (05/08 1117) Pulse Rate: 107 (05/08 1117)  Labs:  Recent Labs  05/05/17 1636 05/05/17 2240 05/05/17 2332 05/06/17 0429 05/06/17 1104  HGB 9.8*  --  9.1* 9.2*  --   HCT 30.3*  --  27.8* 28.4*  --   PLT 242  --  243 230  --   LABPROT  --  28.7*  --  30.7*  --   INR  --  2.64  --  2.87  --   CREATININE 10.14*  --  10.28* 7.31*  --   TROPONINI  --   --  0.85* 0.82* 0.80*    Estimated Creatinine Clearance: 17.9 mL/min (A) (by C-G formula based on SCr of 7.31 mg/dL (H)).   Medical History: Past Medical History:  Diagnosis Date  . Abdominal pain   . Anemia   . Diabetes ([place])   . ESRD (end stage renal disease) ([place])   . ESRD on dialysis ([place]) 03/13/2017  . Fatigue   . HIV disease ([place])   . Hyperparathyroidism, secondary ([place])   . Hypertension   . 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 Pulmonary congestion   . SOB (shortness of breath)   . SVT (supraventricular tachycardia) ([place])    ? afib or atrial flutter s/p TEE/DCCV with subsequent ablation due to reoccurrence in [place]    Medications:  Prescriptions Prior to Admission  Medication Sig Dispense Refill Last Dose  . amLODipine (NORVASC) 10 MG tablet Take 10 mg by mouth daily.   05/05/2017 at Unknown time  . aspirin EC 81 MG tablet Take 81 mg by mouth daily.   05/05/2017 at Unknown time  . cinacalcet (SENSIPAR) 60 MG tablet Take 60 mg by mouth daily.   Past Week at Unknown time  . ferrous sulfate 325 (65 FE) MG tablet Take 325 mg by mouth 3 (three) times daily with meals.   05/05/2017 at Unknown time  . fosamprenavir  (LEXIVA) 700 MG tablet Take 700 mg by mouth 2 (two) times daily.    05/05/2017 at Unknown time  . hydrALAZINE (APRESOLINE) 10 MG tablet Take 10 mg by mouth 3 (three) times daily.   05/05/2017 at Unknown time  . Insulin Glargine (LANTUS SOLOSTAR) 100 UNIT/ML Solostar Pen Inject 10 Units into the skin daily at 10 pm.    Past Week at Unknown time  . lisinopril (PRINIVIL,ZESTRIL) 40 MG tablet Take 40 mg by mouth daily.   05/05/2017 at Unknown time  . metoprolol (LOPRESSOR) 100 MG tablet Take 100 mg by mouth daily.   05/05/2017 at ?  . Multiple Vitamin (TAB-A-VITE PO) Take 1 tablet by mouth daily.   05/05/2017 at Unknown time  . raltegravir (ISENTRESS) 400 MG tablet Take 400 mg by mouth 2 (two) times daily.   05/05/2017 at Unknown time  . ritonavir (NORVIR) 100 MG capsule Take 100 mg by mouth daily with breakfast.   05/05/2017 at Unknown time  . sevelamer carbonate (RENVELA) 800 MG tablet Take 800 mg by mouth 3 (three) times daily with meals.   Past Week at Unknown time  . warfarin (COUMADIN) 10 MG tablet Take  10 mg by mouth See admin instructions. Take 10 mg by mouth daily Monday through Friday.   Past Week at Unknown time  . warfarin (COUMADIN) 7.5 MG tablet Take 7.5 mg by mouth See admin instructions. Take 7.5 mg by mouth daily on Saturday and Sunday   Past Week at Unknown time  . Multiple Vitamin (MULTIVITAMIN) tablet Take 1 tablet by mouth daily.   Unknown at Unknown    Assessment: 51 y.o male on coumadin pta for h/o Afib s/p ablation. Presented to [facility] ED 5/7 with worsening of SOB; acute heart failure. INR on admit date 05/05/17 was 2.64, therapeutic. CHADS2VASC score of 2 (HTN, DM).  Today the INR is 2.87, remains therapeutic.  CBC: hgb low stable at 9.2   and pltc wnl 230k. No bleeding noted.   Pta coumadin dose : 10mg  daily M thru F and 7.5 mg qSS, last taken pta "past week"   Goal of Therapy:  INR 2-3 Monitor platelets by anticoagulation protocol: Yes   Plan:  Warfarin 10mg  today x1  Daily  INR Monitor s/sx of bleeding   Thank you for allowing pharmacy to be part of this patients care team. Nicole Cella, [place] Clinical Pharmacist Pager: [phone] 8A-4P [phone] 4P-10P [phone] [place] [phone] 05/06/2017,2:03 PM

## 2017-05-06 NOTE — Consult Note (Signed)
CARDIOLOGY CONSULT NOTE   Patient ID: Thomas [name] MRN: [id number] DOB/AGE: 1966/11/21 51 y.o.  Admit date: 05/05/2017  Requesting Physician: Dr. Karleen Hampshire Primary Physician:   Patient, No Pcp Per Primary Cardiologist:   Fransico Him, MD (first visit 03/12/17) Reason for Consultation:   CHF and elevated Troponin  HPI: Thomas [name] is a 51 y.o. male who is being seen today in consultation at the request of Dr. Karleen Hampshire for CHF and elevated Troponin.  The patient has as PMH of DM, ESRD (on dialysis), HIV- well controlled, hypertension, hx of SVT and palpitations, possible afib.flutter on chronic (previous TEE/DCCV) anticoagulation-- Warfarin, BKA left 2016  He presented to the ER on 05/05/2017  Due to worsening SOB and orthopnea for the past two days. He has had some occasional CP but has not had any since arriving to the hospital. He reports compliance with dialysis. Oxygen saturations were int he 70's on arrival and he was started on BiPap and duonebs and steroids. Chest xray showed cardiomegaly with central bilateral parahilar opacity.  BNP 1,980.3,  WBC, 8.3, Hgb 9.2, creatinine 7.31, K 4.3, Na 132, Troponin 0.85--> 0.82.   [facility] the patient, plan is for hemodialysis today and to obtain an echocardiogram. Moved here to be closer to his brother 3 months ago from [place]. He says that he only gets chest pains when he is fluid overloaded and otherwise does not get chest pain or exertional angina. He says that he had a cardiac catheterization (??) approx 1 year ago in [place] but we do not have any records of this.       Past Medical History:  Diagnosis Date  . Abdominal pain   . Anemia   . Diabetes ([place])   . ESRD (end stage renal disease) ([place])   . ESRD on dialysis ([place]) 03/13/2017  . Fatigue   . HIV disease ([place])   . Hyperparathyroidism, secondary ([place])   . Hypertension   . Hypertensive heart disease with end stage renal disease on dialysis ([place]) 03/13/2017  . Pulmonary  congestion   . SOB (shortness of breath)   . SVT (supraventricular tachycardia) ([place])    ? afib or atrial flutter s/p TEE/DCCV with subsequent ablation due to reoccurrence in [place]     Past Surgical History:  Procedure Laterality Date  . AMPUTATION Left    foot  . RIGHT AV FISTULA PLACEMENT  10/19/2012    No Known Allergies  I have reviewed the patient's current medications . amLODipine  10 mg Oral Daily  . aspirin EC  81 mg Oral Daily  . cinacalcet  60 mg Oral Q breakfast  . fosamprenavir  700 mg Oral BID  . heparin  4,000 Units Dialysis Once in dialysis  . hydrALAZINE  10 mg Oral TID  . insulin aspart  0-15 Units Subcutaneous TID WC  . insulin aspart  0-5 Units Subcutaneous QHS  . insulin glargine  20 Units Subcutaneous Q2200  . lisinopril  40 mg Oral Daily  . metoprolol  100 mg Oral Daily  . raltegravir  400 mg Oral BID  . ritonavir  100 mg Oral Q breakfast  . senna  1 tablet Oral BID  . sevelamer carbonate  800 mg Oral TID WC  . sodium chloride flush  3 mL Intravenous Q12H  . sodium chloride flush  3 mL Intravenous Q12H  . Warfarin - Pharmacist Dosing Inpatient   Does not apply q1800   . sodium chloride    .  sodium chloride    . sodium chloride     sodium chloride, sodium chloride, sodium chloride, acetaminophen **OR** acetaminophen, alteplase, bisacodyl, heparin, lidocaine (PF), lidocaine-prilocaine, ondansetron **OR** ondansetron (ZOFRAN) IV, pentafluoroprop-tetrafluoroeth, polyethylene glycol, sodium chloride flush  Prior to Admission medications   Medication Sig Start Date End Date Taking? Authorizing Provider  aspirin EC 81 MG tablet Take 81 mg by mouth daily.   Yes [provider]  Insulin Glargine (LANTUS SOLOSTAR) 100 UNIT/ML Solostar Pen Inject 10 Units into the skin daily at 10 pm.    Yes [provider]  warfarin (COUMADIN) 10 MG tablet Take 10 mg by mouth See admin instructions. Take 10 mg by mouth daily Monday through Friday.   Yes  [provider]  warfarin (COUMADIN) 7.5 MG tablet Take 7.5 mg by mouth See admin instructions. Take 7.5 mg by mouth daily on Saturday and Sunday   Yes [provider]  amLODipine (NORVASC) 10 MG tablet Take 10 mg by mouth daily.    [provider]  cinacalcet (SENSIPAR) 60 MG tablet Take 60 mg by mouth daily.    [provider]  fosamprenavir (LEXIVA) 700 MG tablet Take by mouth 2 (two) times daily.    [provider]  hydrALAZINE (APRESOLINE) 10 MG tablet Take 10 mg by mouth 3 (three) times daily.    [provider]  lisinopril (PRINIVIL,ZESTRIL) 40 MG tablet Take 40 mg by mouth daily.    [provider]  metoprolol (LOPRESSOR) 100 MG tablet Take 100 mg by mouth daily.    [provider]  Multiple Vitamin (MULTIVITAMIN) tablet Take 1 tablet by mouth daily.    [provider]  raltegravir (ISENTRESS) 400 MG tablet Take 400 mg by mouth 2 (two) times daily.    [provider]  ritonavir (NORVIR) 100 MG capsule Take 100 mg by mouth daily with breakfast.    [provider]  sevelamer carbonate (RENVELA) 800 MG tablet Take 800 mg by mouth 3 (three) times daily with meals.    [provider]     Social History   Social History  . Marital status: Married    Spouse name: kim  . Number of children: 3  . Years of education: college   Occupational History  . disabled    Social History Main Topics  . Smoking status: Never Smoker  . Smokeless tobacco: Never Used  . Alcohol use No  . Drug use: No  . Sexual activity: Not on file   Other Topics Concern  . Not on file   Social History Narrative  . No narrative on file    Family Status  Relation Status  . Mother Deceased  . Father Deceased  . Sister Alive  . Brother Alive  . Maternal Grandmother Deceased  . Maternal Grandfather Deceased  . Paternal Grandmother Deceased  . Paternal Grandfather Deceased  . Sister Alive  . Sister  Alive  . Neg Hx    Family History  Problem Relation Age of Onset  . Heart disease Mother   . Diabetes Mother   . Heart disease Father   . Cancer Neg Hx          ROS:  Full 14 point review of systems complete and found to be negative unless listed above.  Physical Exam: Blood pressure 133/85, pulse (!) 104, temperature 99 F (37.2 C), temperature source Oral, resp. rate 20, height 6\' 6"  (1.981 m), weight 276 lb 1.6 oz (125.2 kg), SpO2 92 %.  General: Well developed, well nourished, male in no acute distress Head: No xanthomas. Normocephalic and atraumatic, Lungs: normal rate of breathing, + mildly increased effort Heart:: normal rate and rhythm + JVD  Neck: No carotid bruits. No lymphadenopathy. Abdomen: Bowel sounds present, abdomen soft and non-tender Msk:  Spontaneously moves all 4 extremities, left BTA Extremities: No clubbing or cyanosis. + le edema Neuro: Alert and oriented X 3. No focal deficits noted. Psych:  irritable, responds appropriately Skin: No rashes or lesions noted.     Labs:  Lab Results  Component Value Date   WBC 8.3 05/06/2017   HGB 9.2 (L) 05/06/2017   HCT 28.4 (L) 05/06/2017   MCV 91.6 05/06/2017   PLT 230 05/06/2017    Recent Labs  05/06/17 0429  INR 2.87    Recent Labs Lab 05/06/17 0429  NA 132*  K 4.3  CL 92*  CO2 25  BUN 40*  CREATININE 7.31*  CALCIUM 9.2  PROT 7.8  BILITOT 1.3*  ALKPHOS 124  ALT 13*  AST 16  GLUCOSE 556*  ALBUMIN 3.1*   Magnesium  Date Value Ref Range Status  05/06/2017 2.1 1.7 - 2.4 mg/dL Final    Recent Labs  05/05/17 2332 05/06/17 0429  TROPONINI 0.85* 0.82*    Recent Labs  05/05/17 1718  TROPIPOC 1.07*   No results found for: PROBNP Lab Results  Component Value Date   CHOL 141 03/10/2017   HDL 22 (L) 03/10/2017   LDLCALC 62 03/10/2017   TRIG 284 (H) 03/10/2017   TSH  Date/Time Value Ref Range Status  05/06/2017 04:32 AM 0.520 0.350 - 4.500 uIU/mL Final    Comment:     Performed by a 3rd Generation assay with a functional sensitivity of <=0.01 uIU/mL.    Echo   Pending for this admission.  ECG:     HR 100, sinus rhythm with Prolonged QT with intraventricular conduction delay- reviewed personally.   Radiology:Dg Chest Portable 1 View  Result Date: 05/05/2017 CLINICAL DATA:  Shortness of breath. EXAM: PORTABLE CHEST 1 VIEW COMPARISON:  None. FINDINGS: 1638 hours. Parahilar central airspace opacity is symmetric. There is fullness in the hilar regions bilaterally. The cardio pericardial silhouette is enlarged. The visualized bony structures of the thorax are intact. Telemetry leads overlie the chest. IMPRESSION: Cardiomegaly with central bilateral parahilar airspace opacity. Imaging features most suggestive of pulmonary edema. Hilar fullness bilaterally. Repeat dedicated PA and lateral films recommended after resolution of acute symptoms. Electronically Signed   By: Misty Stanley M.D.   On: 05/05/2017 16:50       ASSESSMENT AND PLAN:      Active Problems:   Hypertensive heart disease with end stage renal disease on dialysis ([place])   ESRD on dialysis ([place])   Type 2 diabetes mellitus with complication (HCC)   HIV disease (HCC)   Fluid overload   Elevated troponin   Pulmonary edema   ESRD (end stage renal disease) ([place])  Brief episode of chest pain in the setting of heart failure: in a dialysis patient with elevated Troponins. Troponin 0.85--> 0.82 He has and had chest pain in the setting of fluid overload. He reports having a cardiac craterization 1 year ago but it is not entirely clear if he did or not. He said he would be willing to release his records if we needed. He is currently pain free and pending dialysis. I would continue to cycle enzymes and obtain echocardiogram for now. Unless troponins elevate or he has reoccurring CP  we do not plan to do ischemic work-up inpatient.  Chronic Atrial fibrillation  -- CHA2DS2 vas score 3 : continue current  anticoagulation with  Coumadin per pharmacy -- Rate control with Toprol  Fluid overload/CHF: Echocardiogram is pending. Patient will go to dialysis to have fluid removed.  he did have a few brief episodes of chest pain.  -- Nephrology managing fluid levels -- admission weight 283, today 276.   ESRD (end stage renal disease) ([place]) on HD Tu/Th/Sa: required dialysis today secondary to fluid overload  -- nephrology managing  Hypertension: Nephrology and medicine managing HIV disease: medicine to manage Type 2 DM: Medicine to manage  with complication [facility]) poorly controlled will do diabetes coordinator consult and sliding scale    Signed: Linus Mako, PA-C 05/06/2017 11:06 AM

## 2017-05-07 ENCOUNTER — Inpatient Hospital Stay (HOSPITAL_COMMUNITY): Payer: Medicare Other

## 2017-05-07 DIAGNOSIS — I351 Nonrheumatic aortic (valve) insufficiency: Secondary | ICD-10-CM

## 2017-05-07 DIAGNOSIS — E118 Type 2 diabetes mellitus with unspecified complications: Secondary | ICD-10-CM

## 2017-05-07 DIAGNOSIS — Z794 Long term (current) use of insulin: Secondary | ICD-10-CM

## 2017-05-07 LAB — GLUCOSE, CAPILLARY
GLUCOSE-CAPILLARY: 503 mg/dL — AB (ref 65–99)
GLUCOSE-CAPILLARY: 517 mg/dL — AB (ref 65–99)
GLUCOSE-CAPILLARY: 231 mg/dL — AB (ref 65–99)
GLUCOSE-CAPILLARY: 274 mg/dL — AB (ref 65–99)
Glucose-Capillary: 522 mg/dL (ref 65–99)
Glucose-Capillary: 416 mg/dL — ABNORMAL HIGH (ref 65–99)

## 2017-05-07 LAB — ECHOCARDIOGRAM COMPLETE
Height: 78 in
Weight: 4377.45 oz

## 2017-05-07 LAB — PROTIME-INR
INR: 2.07
Prothrombin Time: 23.6 seconds — ABNORMAL HIGH (ref 11.4–15.2)

## 2017-05-07 MED ORDER — LIVING WELL WITH DIABETES BOOK
Freq: Once | Status: DC
  Filled 2017-05-07: qty 1

## 2017-05-07 MED ORDER — SODIUM CHLORIDE 0.9 % IV SOLN
125.0000 mg | INTRAVENOUS | Status: DC
  Administered 2017-05-08: 125 mg via INTRAVENOUS
  Filled 2017-05-07 (×2): qty 10

## 2017-05-07 MED ORDER — INSULIN GLARGINE 100 UNIT/ML ~~LOC~~ SOLN
30.0000 [IU] | Freq: Every day | SUBCUTANEOUS | Status: DC
  Administered 2017-05-07: 30 [IU] via SUBCUTANEOUS
  Filled 2017-05-07 (×2): qty 0.3

## 2017-05-07 MED ORDER — DOXERCALCIFEROL 4 MCG/2ML IV SOLN
5.0000 ug | INTRAVENOUS | Status: DC
  Administered 2017-05-08: 5 ug via INTRAVENOUS
  Filled 2017-05-07: qty 4

## 2017-05-07 MED ORDER — INSULIN ASPART 100 UNIT/ML ~~LOC~~ SOLN
0.0000 [IU] | Freq: Three times a day (TID) | SUBCUTANEOUS | Status: DC
  Administered 2017-05-07: 20 [IU] via SUBCUTANEOUS
  Administered 2017-05-07 – 2017-05-08 (×2): 7 [IU] via SUBCUTANEOUS

## 2017-05-07 MED ORDER — INSULIN ASPART 100 UNIT/ML ~~LOC~~ SOLN
10.0000 [IU] | Freq: Three times a day (TID) | SUBCUTANEOUS | Status: DC
  Administered 2017-05-07 (×2): 10 [IU] via SUBCUTANEOUS

## 2017-05-07 MED ORDER — WARFARIN SODIUM 2 MG PO TABS
12.0000 mg | ORAL_TABLET | Freq: Once | ORAL | Status: AC
  Administered 2017-05-07: 12 mg via ORAL
  Filled 2017-05-07: qty 1

## 2017-05-07 NOTE — Progress Notes (Signed)
PROGRESS NOTE    Olaf Mesa  LYY:[id number] DOB: [birth year] DOA: 05/05/2017 PCP: Patient, No Pcp Per    Brief Narrative: Nachman Sundt is a 51 y.o. male with medical history significant of ESRD on HD, HIV, DM 2, A.fib, sp BKA left 2016, HTN. history of SVT and palpitations,  Presented with worsening shortness of breath and orthopnea for the past 2 days. He was found to be fluid overloaded, hypoxic, requiring oxygen  and admitted for HD.   Assessment & Plan:   Active Problems:   Hypertensive heart disease with end stage renal disease on dialysis ([place])   ESRD on dialysis ([place])   Type 2 diabetes mellitus with complication (HCC)   HIV disease (HCC)   Fluid overload   Elevated troponin   Pulmonary edema   ESRD (end stage renal disease) (HCC)  Pulmonary edema, fluid overload leading to acute respiratory failure with hypoxia.  Has not missed HD .  HD ON 5/8  And we were able to wean him off the oxygen.  Appreciate nephrology and cardiology recommendations.    Type 2 DM:  Not well controlled.  CBG (last 3)   Recent Labs  05/07/17 0429 05/07/17 0739 05/07/17 1119  GLUCAP 517* 522* 416*    hgba1c is greater than 11.  Added novolog 10 units tidac. And increased lantus to 30 units daily.  Get BMP to see if he is acidotic with an anion gap.     HYPERTENSION;  Much better.  No change in meds.    HIV disease:  Resume hom meds.   Chronic afib: rate controlled. Resume coumadin as per pharmacy. Therapeutic INR.    Elevated troponin probably from demand ischemia from fluid overload.  No further intervention at this time.  No chest pain.       DVT prophylaxis: COUMADIN) Code Status: (FULL CODE.  Family Communication: none at bedside.  Disposition Plan: possibly home when cbg's are better controlled.   Consultants:  RENAL AND CARDIOLOGY.   Procedures: HD   Antimicrobials:none.    Subjective: Reports feeling miserable and frustrated about his blood sugars.      Objective: Vitals:   05/07/17 0108 05/07/17 0434 05/07/17 0739 05/07/17 1121  BP: 121/83 127/87 124/77 106/72  Pulse: 87 89 99 86  Resp:   (!) 24 (!) 22  Temp:  98.1 F (36.7 C) 98.7 F (37.1 C) 98.3 F (36.8 C)  TempSrc: Oral Oral Oral Oral  SpO2: 96% 95% 94% 96%  Weight:  124.1 kg (273 lb 9.5 oz)    Height:        Intake/Output Summary (Last 24 hours) at 05/07/17 1301 Last data filed at 05/07/17 0900  Gross per 24 hour  Intake              120 ml  Output             4000 ml  Net            -3880 ml   Filed Weights   05/06/17 1426 05/06/17 1726 05/07/17 0434  Weight: 123.6 kg (272 lb 7.8 oz) 118.9 kg (262 lb 2 oz) 124.1 kg (273 lb 9.5 oz)    Examination:  General exam: restless. Just took off the oxygen.  Respiratory system: basilar rales. , no wheezing heard.  Cardiovascular system: S1 & S2 heard, RRR. No JVD, murmurs, rubs, gallops or clicks. No pedal edema. Gastrointestinal system: Abdomen is nondistended, soft and nontender. No organomegaly or masses felt. Normal bowel sounds heard.  Central nervous system: Alert and oriented. No focal neurological deficits. Extremities: Symmetric 5 x 5 power. Skin: No rashes, lesions or ulcers Psychiatry: Judgement and insight appear normal. Mood & affect appropriate.     Data Reviewed: I have personally reviewed following labs and imaging studies  CBC:  Recent Labs Lab 05/05/17 1636 05/05/17 2332 05/06/17 0429  WBC 12.3* 12.2* 8.3  HGB 9.8* 9.1* 9.2*  HCT 30.3* 27.8* 28.4*  MCV 92.1 92.4 91.6  PLT 242 243 425   Basic Metabolic Panel:  Recent Labs Lab 05/05/17 1636 05/05/17 2332 05/06/17 0429 05/06/17 2236  NA 131* 130* 132*  --   K 4.5 5.9* 4.3  --   CL 92* 91* 92*  --   CO2 23 22 25   --   GLUCOSE 465* 575* 556* 557*  BUN 51* 58* 40*  --   CREATININE 10.14* 10.28* 7.31*  --   CALCIUM 9.0 8.8* 9.2  --   MG  --   --  2.1  --   PHOS  --  6.3* 4.6  --    GFR: Estimated Creatinine Clearance: 17.9 mL/min  (A) (by C-G formula based on SCr of 7.31 mg/dL (H)). Liver Function Tests:  Recent Labs Lab 05/05/17 1636 05/05/17 2332 05/06/17 0429  AST 13*  --  16  ALT 12*  --  13*  ALKPHOS 131*  --  124  BILITOT 1.2  --  1.3*  PROT 7.9  --  7.8  ALBUMIN 3.5 3.2* 3.1*   No results for input(s): LIPASE, AMYLASE in the last 168 hours. No results for input(s): AMMONIA in the last 168 hours. Coagulation Profile:  Recent Labs Lab 05/05/17 2240 05/06/17 0429 05/07/17 0640  INR 2.64 2.87 2.07   Cardiac Enzymes:  Recent Labs Lab 05/05/17 2332 05/06/17 0429 05/06/17 1104  TROPONINI 0.85* 0.82* 0.80*   BNP (last 3 results) No results for input(s): PROBNP in the last 8760 hours. HbA1C:  Recent Labs  05/05/17 2331  HGBA1C 11.7*   CBG:  Recent Labs Lab 05/06/17 2053 05/07/17 0102 05/07/17 0429 05/07/17 0739 05/07/17 1119  GLUCAP 443* 503* 517* 522* 416*   Lipid Profile: No results for input(s): CHOL, HDL, LDLCALC, TRIG, CHOLHDL, LDLDIRECT in the last 72 hours. Thyroid Function Tests:  Recent Labs  05/06/17 0432  TSH 0.520   Anemia Panel: No results for input(s): VITAMINB12, FOLATE, FERRITIN, TIBC, IRON, RETICCTPCT in the last 72 hours. Sepsis Labs: No results for input(s): PROCALCITON, LATICACIDVEN in the last 168 hours.  Recent Results (from the past 240 hour(s))  MRSA PCR Screening     Status: None   Collection Time: 05/06/17 12:45 AM  Result Value Ref Range Status   MRSA by PCR NEGATIVE NEGATIVE Final    Comment:        The GeneXpert MRSA Assay (FDA approved for NASAL specimens only), is one component of a comprehensive MRSA colonization surveillance program. It is not intended to diagnose MRSA infection nor to guide or monitor treatment for MRSA infections.          Radiology Studies: Dg Chest Portable 1 View  Result Date: 05/05/2017 CLINICAL DATA:  Shortness of breath. EXAM: PORTABLE CHEST 1 VIEW COMPARISON:  None. FINDINGS: 1638 hours.  Parahilar central airspace opacity is symmetric. There is fullness in the hilar regions bilaterally. The cardio pericardial silhouette is enlarged. The visualized bony structures of the thorax are intact. Telemetry leads overlie the chest. IMPRESSION: Cardiomegaly with central bilateral parahilar airspace opacity. Imaging features most  suggestive of pulmonary edema. Hilar fullness bilaterally. Repeat dedicated PA and lateral films recommended after resolution of acute symptoms. Electronically Signed   By: Misty Stanley M.D.   On: 05/05/2017 16:50        Scheduled Meds: . amLODipine  10 mg Oral Daily  . aspirin EC  81 mg Oral Daily  . cinacalcet  60 mg Oral Q breakfast  . [START ON 05/08/2017] doxercalciferol  5 mcg Intravenous Q T,Th,Sa-HD  . fosamprenavir  700 mg Oral BID  . heparin  4,000 Units Dialysis Once in dialysis  . hydrALAZINE  10 mg Oral TID  . insulin aspart  0-20 Units Subcutaneous TID WC  . insulin aspart  0-5 Units Subcutaneous QHS  . insulin aspart  10 Units Subcutaneous TID WC  . insulin glargine  30 Units Subcutaneous Q2200  . lisinopril  40 mg Oral Daily  . metoprolol  100 mg Oral Daily  . raltegravir  400 mg Oral BID  . ritonavir  100 mg Oral Q breakfast  . senna  1 tablet Oral BID  . sevelamer carbonate  800 mg Oral TID WC  . sodium chloride flush  3 mL Intravenous Q12H  . sodium chloride flush  3 mL Intravenous Q12H  . warfarin  12 mg Oral ONCE-1800  . Warfarin - Pharmacist Dosing Inpatient   Does not apply q1800   Continuous Infusions: . sodium chloride    . sodium chloride    . sodium chloride    . [START ON 05/08/2017] ferric gluconate (FERRLECIT/NULECIT) IV       LOS: 2 days    Time spent: 35 minutes.     Hosie Poisson, MD [facility] Pager [phone] If 7PM-7AM, please contact night-coverage [email or web address] Password [email or web address] 05/07/2017, 1:01 PM

## 2017-05-07 NOTE — Progress Notes (Signed)
Subjective:   Awoke from sleep ,No cos ,tolerated HD 4 l uf yesterday  Below edw  Now   Objective Vital signs in last 24 hours: Vitals:   05/06/17 2052 05/07/17 0108 05/07/17 0434 05/07/17 0739  BP: (!) 129/94 121/83 127/87 124/77  Pulse: 74 87 89 99  Resp:    (!) 24  Temp: 99.3 F (37.4 C)  98.1 F (36.7 C) 98.7 F (37.1 C)  TempSrc: Oral Oral Oral Oral  SpO2: 95% 96% 95% 94%  Weight:   124.1 kg (273 lb 9.5 oz)   Height:       Weight change: -4.768 kg (-10 lb 8.2 oz)  Physical Exam: General:  NAD , alert OX3 Heart: RRR 1/6 sem , no rub or gallop  Lungs: CTA bilat  Abdomen: obese, soft Nt, ND Extremities:  R lower extrem No pedal edema /  L BKA  Dialysis Access: pos bruit R FA AVF     OP HD= [facility] 2nd shift; on HD for past 5 years EDW 120.5kg  /4 hrs 57min  HD Bath 3/2.25 Heparin 5000 unit bolus  Mircera 17mcg q 2wks (last on503) Venofer 100 mg q hd x3 more  To finish load / Hectorol 27mcg q hd  Access Rt Cimino  Problem/Plan: 1 Volume overload - Per ED weights about 7kg up on his EDW/ after HD UF on admit and Yesterday UF 4 liter  Wt down to 118.9 kg ( prior op edw 120) agrees to Uf 4 l tomor with , continue to challenge  Wt down 2 ESRD: TTS, providing HD on schedule  3 Hypertension: Controlled; we will need to monitor given the UF tomorrow; may need to hold meds to permit challenging of EDW.  And Per pt he never cramps. 4. Anemia of ESRD: Stable;hgb 9.2   last Mircera 150 q2weeks given on 5/3/ needs Venofer 100mg  x3 to finish loading started as op . 5. 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  6. r/o angina- chest pain improved after HD UF  , trops elevated at 0.8 /Card has seen =  Elevated troponin probably from demand ischemia from fluid overload. And No further intervention . Noted =Recently saw Dr. [name] to establish care - was previously  treated in [place]  7. DM-  On admit HGB A1C 11.7 and   blood glucoses in the 400s-500s here /reports has PCP now that follows DM   8. HIV- adherent to treatment 9. h/o possible afib w/ ablation on Coumadin. - NSR  Now   Ernest Haber, PA-C [facility]per [phone] 05/07/2017,9:43 AM  LOS: 2 days   Labs: Basic Metabolic Panel:  Recent Labs Lab 05/05/17 1636 05/05/17 2332 05/06/17 0429 05/06/17 2236  NA 131* 130* 132*  --   K 4.5 5.9* 4.3  --   CL 92* 91* 92*  --   CO2 23 22 25   --   GLUCOSE 465* 575* 556* 557*  BUN 51* 58* 40*  --   CREATININE 10.14* 10.28* 7.31*  --   CALCIUM 9.0 8.8* 9.2  --   PHOS  --  6.3* 4.6  --    Liver Function Tests:  Recent Labs Lab 05/05/17 1636 05/05/17 2332 05/06/17 0429  AST 13*  --  16  ALT 12*  --  13*  ALKPHOS 131*  --  124  BILITOT 1.2  --  1.3*  PROT 7.9  --  7.8  ALBUMIN 3.5 3.2* 3.1*     Recent Labs Lab 05/05/17 1636 05/05/17 2332 05/06/17 0429  WBC 12.3* 12.2* 8.3  HGB 9.8* 9.1* 9.2*  HCT 30.3* 27.8* 28.4*  MCV 92.1 92.4 91.6  PLT 242 243 230   Cardiac Enzymes:  Recent Labs Lab 05/05/17 2332 05/06/17 0429 05/06/17 1104  TROPONINI 0.85* 0.82* 0.80*   CBG:  Recent Labs Lab 05/06/17 1647 05/06/17 2053 05/07/17 0102 05/07/17 0429 05/07/17 0739  GLUCAP 256* 443* 503* 517* 522*    Studies/Results: Dg Chest Portable 1 View  Result Date: 05/05/2017 CLINICAL DATA:  Shortness of breath. EXAM: PORTABLE CHEST 1 VIEW COMPARISON:  None. FINDINGS: 1638 hours. Parahilar central airspace opacity is symmetric. There is fullness in the hilar regions bilaterally. The cardio pericardial silhouette is enlarged. The visualized bony structures of the thorax are intact. Telemetry leads overlie the chest. IMPRESSION: Cardiomegaly with central bilateral parahilar airspace opacity. Imaging features most suggestive of pulmonary edema. Hilar fullness bilaterally. Repeat dedicated PA and lateral films recommended after  resolution of acute symptoms. Electronically Signed   By: Misty Stanley M.D.   On: 05/05/2017 16:50   Medications: . sodium chloride    . sodium chloride    . sodium chloride     . amLODipine  10 mg Oral Daily  . aspirin EC  81 mg Oral Daily  . cinacalcet  60 mg Oral Q breakfast  . fosamprenavir  700 mg Oral BID  . heparin  4,000 Units Dialysis Once in dialysis  . hydrALAZINE  10 mg Oral TID  . insulin aspart  0-20 Units Subcutaneous TID WC  . insulin aspart  0-5 Units Subcutaneous QHS  . insulin aspart  10 Units Subcutaneous TID WC  . insulin glargine  30 Units Subcutaneous Q2200  . lisinopril  40 mg Oral Daily  . metoprolol  100 mg Oral Daily  . raltegravir  400 mg Oral BID  . ritonavir  100 mg Oral Q breakfast  . senna  1 tablet Oral BID  . sevelamer carbonate  800 mg Oral TID WC  . sodium chloride flush  3 mL Intravenous Q12H  . sodium chloride flush  3 mL Intravenous Q12H  . Warfarin - Pharmacist Dosing Inpatient   Does not apply [phone]

## 2017-05-07 NOTE — Progress Notes (Signed)
Inpatient Diabetes Program Recommendations  AACE/ADA: New Consensus Statement on Inpatient Glycemic Control (2015)  Target Ranges:  Prepandial:   less than 140 mg/dL      Peak postprandial:   less than 180 mg/dL (1-2 hours)      Critically ill patients:  140 - 180 mg/dL   Lab Results  Component Value Date   GLUCAP 416 (H) 05/07/2017   HGBA1C 11.7 (H) 05/05/2017    Review of Glycemic Control:   Results for Thomas [name], Thomas [name] (MRN [id number]) as of 05/07/2017 14:39  Ref. Range 05/06/2017 20:53 05/07/2017 01:02 05/07/2017 04:29 05/07/2017 07:39 05/07/2017 11:19  Glucose-Capillary Latest Ref Range: 65 - 99 mg/dL 443 (H) 503 (HH) 517 (HH) 522 (HH) 416 (H)   Diabetes history: Type 2 diabetes Outpatient Diabetes medications: Lantus 10 units daily Current orders for Inpatient glycemic control:  Lantus 30 units daily, Novolog 10 units tid with meals, Novolog resistant tid with meals and HS  Inpatient Diabetes Program Recommendations:    Note that CBG's remain elevated.  RN states that patient is constantly hungry.  Spoke with patient with RN and discussed importance of getting blood sugars controlled.  Patient states "I know I need to do better".  Told him that he will likely need more that one shot a day and also will need to monitor at least 3-4 times daily.  He seems open to this today.  Hopefully dialysis will help with blood sugars today.  ? May benefit from Endocrinology consult after d/c. He admits that eating is difficult for him b/c of fluid restrictions and low protein diet.  I also explained that high blood sugars make you more hungry.   Patient much more open today to recommendations and teaching.  Will order book and videos for him to watch as well.   Thanks, Adah Perl, RN, BC-ADM Inpatient Diabetes Coordinator Pager [phone] (8a-5p)

## 2017-05-07 NOTE — Progress Notes (Signed)
ANTICOAGULATION CONSULT NOTE -  Follow up  Pharmacy Consult for Coumadin  Indication: h/o  atrial fibrillation  No Known Allergies  Patient Measurements: Height: 6\' 6"  (198.1 cm) Weight: 273 lb 9.5 oz (124.1 kg) IBW/kg (Calculated) : 91.4 Heparin Dosing Weight: 117.5 kg  Vital Signs: Temp: 98.3 F (36.8 C) (05/09 1121) Temp Source: Oral (05/09 1121) BP: 106/72 (05/09 1121) Pulse Rate: 86 (05/09 1121)  Labs:  Recent Labs  05/05/17 1636 05/05/17 2240 05/05/17 2332 05/06/17 0429 05/06/17 1104 05/07/17 0640  HGB 9.8*  --  9.1* 9.2*  --   --   HCT 30.3*  --  27.8* 28.4*  --   --   PLT 242  --  243 230  --   --   LABPROT  --  28.7*  --  30.7*  --  23.6*  INR  --  2.64  --  2.87  --  2.07  CREATININE 10.14*  --  10.28* 7.31*  --   --   TROPONINI  --   --  0.85* 0.82* 0.80*  --     Estimated Creatinine Clearance: 17.9 mL/min (A) (by C-G formula based on SCr of 7.31 mg/dL (H)).   Medical History: Past Medical History:  Diagnosis Date  . Abdominal pain   . Anemia   . Diabetes ([place])   . ESRD (end stage renal disease) ([place])   . ESRD on dialysis ([place]) 03/13/2017  . Fatigue   . HIV disease ([place])   . Hyperparathyroidism, secondary ([place])   . Hypertension   . Hypertensive heart disease with end stage renal disease on dialysis ([place]) 03/13/2017  . Pulmonary congestion   . SOB (shortness of breath)   . SVT (supraventricular tachycardia) ([place])    ? afib or atrial flutter s/p TEE/DCCV with subsequent ablation due to reoccurrence in [place]    Medications:  Prescriptions Prior to Admission  Medication Sig Dispense Refill Last Dose  . amLODipine (NORVASC) 10 MG tablet Take 10 mg by mouth daily.   05/05/2017 at Unknown time  . aspirin EC 81 MG tablet Take 81 mg by mouth daily.   05/05/2017 at Unknown time  . cinacalcet (SENSIPAR) 60 MG tablet Take 60 mg by mouth daily.   Past Week at Unknown time  . ferrous sulfate 325 (65 FE) MG tablet Take 325 mg by mouth 3 (three) times daily with  meals.   05/05/2017 at Unknown time  . fosamprenavir (LEXIVA) 700 MG tablet Take 700 mg by mouth 2 (two) times daily.    05/05/2017 at Unknown time  . hydrALAZINE (APRESOLINE) 10 MG tablet Take 10 mg by mouth 3 (three) times daily.   05/05/2017 at Unknown time  . Insulin Glargine (LANTUS SOLOSTAR) 100 UNIT/ML Solostar Pen Inject 10 Units into the skin daily at 10 pm.    Past Week at Unknown time  . lisinopril (PRINIVIL,ZESTRIL) 40 MG tablet Take 40 mg by mouth daily.   05/05/2017 at Unknown time  . metoprolol (LOPRESSOR) 100 MG tablet Take 100 mg by mouth daily.   05/05/2017 at ?  . Multiple Vitamin (TAB-A-VITE PO) Take 1 tablet by mouth daily.   05/05/2017 at Unknown time  . raltegravir (ISENTRESS) 400 MG tablet Take 400 mg by mouth 2 (two) times daily.   05/05/2017 at Unknown time  . ritonavir (NORVIR) 100 MG capsule Take 100 mg by mouth daily with breakfast.   05/05/2017 at Unknown time  . sevelamer carbonate (RENVELA) 800 MG tablet Take 800 mg by mouth  3 (three) times daily with meals.   Past Week at Unknown time  . warfarin (COUMADIN) 10 MG tablet Take 10 mg by mouth See admin instructions. Take 10 mg by mouth daily Monday through Friday.   Past Week at Unknown time  . warfarin (COUMADIN) 7.5 MG tablet Take 7.5 mg by mouth See admin instructions. Take 7.5 mg by mouth daily on Saturday and Sunday   Past Week at Unknown time  . Multiple Vitamin (MULTIVITAMIN) tablet Take 1 tablet by mouth daily.   Unknown at Unknown    Assessment: 51 y.o male on coumadin pta for h/o Afib s/p ablation. Presented to [facility] ED 5/7 with worsening of SOB; acute heart failure. INR on admit date 05/05/17 was 2.64, therapeutic. CHADS2VASC score of 2 (HTN, DM).  Today the INR is 2.07, decreased but remains therapeutic.  Pt reports he last took coumadin on Sunday 5/6 prior to this admission.  No coumadin given on 5/7. On 05/06/17, CBC: hgb low stable at 9.2  and pltc wnl 230k. No bleeding noted.     Pta coumadin dose : 10mg  daily M thru F  and 7.5 mg qSS, last taken pta "past week"   Goal of Therapy:  INR 2-3 Monitor platelets by anticoagulation protocol: Yes   Plan:  Warfarin 12 mg today x1  Daily INR Monitor s/sx of bleeding Coumadin education reviewed with the patient today. He states he already has a coumadin education book.    Thank you for allowing pharmacy to be part of this patients care team. Nicole Cella, [place] Clinical Pharmacist Pager: [phone]-4P [phone]-[phone] [phone] [place] [phone] 05/07/2017,12:23 PM

## 2017-05-07 NOTE — Discharge Instructions (Signed)

## 2017-05-07 NOTE — Progress Notes (Signed)
  Echocardiogram 2D Echocardiogram has been performed.  Thomas [name] 05/07/2017, 11:39 AM

## 2017-05-07 NOTE — Progress Notes (Signed)
CBG = 522. Dr. Karleen Hampshire text paged. SSI given + meal coverage.

## 2017-05-08 LAB — RENAL FUNCTION PANEL
ALBUMIN: 2.9 g/dL — AB (ref 3.5–5.0)
ANION GAP: 18 — AB (ref 5–15)
BUN: 70 mg/dL — ABNORMAL HIGH (ref 6–20)
CALCIUM: 7.9 mg/dL — AB (ref 8.9–10.3)
CO2: 21 mmol/L — AB (ref 22–32)
Chloride: 94 mmol/L — ABNORMAL LOW (ref 101–111)
Creatinine, Ser: 9.07 mg/dL — ABNORMAL HIGH (ref 0.61–1.24)
GFR calc Af Amer: 7 mL/min — ABNORMAL LOW (ref >60)
GFR calc non Af Amer: 6 mL/min — ABNORMAL LOW (ref >60)
GLUCOSE: 238 mg/dL — AB (ref 65–99)
POTASSIUM: 3.6 mmol/L (ref 3.5–5.1)
Phosphorus: 6 mg/dL — ABNORMAL HIGH (ref 2.5–4.6)
SODIUM: 133 mmol/L — AB (ref 135–145)

## 2017-05-08 LAB — GLUCOSE, CAPILLARY
GLUCOSE-CAPILLARY: 222 mg/dL — AB (ref 65–99)
GLUCOSE-CAPILLARY: 226 mg/dL — AB (ref 65–99)
GLUCOSE-CAPILLARY: 183 mg/dL — AB (ref 65–99)
GLUCOSE-CAPILLARY: 204 mg/dL — AB (ref 65–99)
Glucose-Capillary: 161 mg/dL — ABNORMAL HIGH (ref 65–99)

## 2017-05-08 LAB — CBC
HCT: 30.8 % — ABNORMAL LOW (ref 39.0–52.0)
Hemoglobin: 10.2 g/dL — ABNORMAL LOW (ref 13.0–17.0)
MCH: 30.3 pg (ref 26.0–34.0)
MCHC: 33.1 g/dL (ref 30.0–36.0)
MCV: 91.4 fL (ref 78.0–100.0)
Platelets: 335 10*3/uL (ref 150–400)
RBC: 3.37 MIL/uL — ABNORMAL LOW (ref 4.22–5.81)
RDW: 16.4 % — AB (ref 11.5–15.5)
WBC: 12.2 10*3/uL — ABNORMAL HIGH (ref 4.0–10.5)

## 2017-05-08 LAB — PROTIME-INR
INR: 2.03
Prothrombin Time: 23.3 seconds — ABNORMAL HIGH (ref 11.4–15.2)

## 2017-05-08 MED ORDER — DOXERCALCIFEROL 4 MCG/2ML IV SOLN
INTRAVENOUS | Status: AC
  Administered 2017-05-08: 5 ug via INTRAVENOUS
  Filled 2017-05-08: qty 4

## 2017-05-08 MED ORDER — INSULIN GLARGINE 100 UNIT/ML ~~LOC~~ SOLN: 30.0000 [IU] | Freq: Every day | SUBCUTANEOUS | 11 refills | Status: DC

## 2017-05-08 MED ORDER — INSULIN ASPART 100 UNIT/ML ~~LOC~~ SOLN: SUBCUTANEOUS | 11 refills | Status: DC

## 2017-05-08 MED ORDER — INSULIN SYRINGES (DISPOSABLE) U-100 0.5 ML MISC: 90.0000 | Freq: Three times a day (TID) | 3 refills | Status: AC

## 2017-05-08 MED ORDER — WARFARIN SODIUM 10 MG PO TABS
10.0000 mg | ORAL_TABLET | Freq: Once | ORAL | Status: DC
  Filled 2017-05-08: qty 1

## 2017-05-08 MED ORDER — SODIUM CHLORIDE 0.9 % IV SOLN: 125.0000 mg | INTRAVENOUS | Status: AC

## 2017-05-08 MED ORDER — DOXERCALCIFEROL 4 MCG/2ML IV SOLN: 5.0000 ug | INTRAVENOUS | Status: DC

## 2017-05-08 NOTE — Procedures (Signed)
Patient seen and examined on Hemodialysis. QB 400, L AVF UF goal 4.1L  Tolerating well--> will use post-weight as new EDW.  Treatment adjusted as needed.  Madelon Lips MD [facility] 9:22 AM

## 2017-05-08 NOTE — Progress Notes (Signed)
[facility] Progress Note   Assessment/ Plan:   1 Volume overload - VS greatly improved.  Will need an adjustment in EDW on discharge. 2 ESRD: TTS, providing HD on schedule today 5/10 3 Hypertension: Controlled; we will need to monitor given the UF today and tomorrow; may need to hold meds to permit challenging of EDW. Per pt he never cramps. 4. Anemia of ESRD: Stable; last Mircera 150 q2weeks given on 5/3, completing iron load (5/10 will be first dose here) 5. Metabolic Bone Disease: Phos in low 5's and PTH trending down 1035 --> 780 from Mar to April 2018. 6. r/o angina- chest pain improved after HD, trops elevated at 0.8 7. DM- blood glucoses in the 400s-500s here--> these are improving.   8. HIV- adherent to treatment 9. h/o possible afib w/ ablation on Coumadin.   Dialyzes at [place] on TTS 2nd shift; on HD for past 5 years Primary Nephrologist Dr. Lorrene Reid EDW 120.5kg HD [place] 3/2.25 Heparin 5000 unit bolus Access Rt [place]  Subjective:    Blood sugars are much better   Objective:   BP 124/77 (BP Location: Left Arm)   Pulse 91   Temp 98.4 F (36.9 C) (Oral)   Resp 16   Ht 6\' 6"  (1.981 m)   Wt 123.1 kg (271 lb 6.2 oz)   SpO2 96%   BMI 31.36 kg/m   Physical Exam: Gen:NAD, sleeping but easily arousable CVS: RRR soft systolic murmur Resp: bilateral inspiratory crackles UTM:[id number] nontender Ext: s/p L BKA. 1+ LE edema ACCESS: R FA fistula + T/B  Labs: BMET  Recent Labs Lab 05/05/17 1636 05/05/17 2332 05/06/17 0429 05/06/17 2236  NA 131* 130* 132*  --   K 4.5 5.9* 4.3  --   CL 92* 91* 92*  --   CO2 23 22 25   --   GLUCOSE 465* 575* 556* 557*  BUN 51* 58* 40*  --   CREATININE 10.14* 10.28* 7.31*  --   CALCIUM 9.0 8.8* 9.2  --   PHOS  --  6.3* 4.6  --    CBC  Recent Labs Lab 05/05/17 1636 05/05/17 2332 05/06/17 0429  WBC 12.3* 12.2* 8.3  HGB 9.8* 9.1* 9.2*  HCT 30.3* 27.8* 28.4*  MCV 92.1 92.4 91.6  PLT 242 243 230     @IMGRELPRIORS @ Medications:    . amLODipine  10 mg Oral Daily  . aspirin EC  81 mg Oral Daily  . cinacalcet  60 mg Oral Q breakfast  . doxercalciferol  5 mcg Intravenous Q T,Th,Sa-HD  . fosamprenavir  700 mg Oral BID  . heparin  4,000 Units Dialysis Once in dialysis  . hydrALAZINE  10 mg Oral TID  . insulin aspart  0-20 Units Subcutaneous TID WC  . insulin aspart  0-5 Units Subcutaneous QHS  . insulin aspart  10 Units Subcutaneous TID WC  . insulin glargine  30 Units Subcutaneous Q2200  . lisinopril  40 mg Oral Daily  . living well with diabetes book   Does not apply Once  . metoprolol  100 mg Oral Daily  . raltegravir  400 mg Oral BID  . ritonavir  100 mg Oral Q breakfast  . senna  1 tablet Oral BID  . sevelamer carbonate  800 mg Oral TID WC  . sodium chloride flush  3 mL Intravenous Q12H  . sodium chloride flush  3 mL Intravenous Q12H  . Warfarin - Pharmacist Dosing Inpatient   Does not apply [phone]  Madelon Lips, MD [facility] 05/08/2017, 8:22 AM

## 2017-05-08 NOTE — Progress Notes (Signed)
Pt had a 15 beat run of V tach. He's asymptomatic and resting comfortably. Vitals stable. Will continue to monitor closely.

## 2017-05-08 NOTE — Progress Notes (Signed)
SATURATION QUALIFICATIONS: (This note is used to comply with regulatory documentation for home oxygen)  Patient Saturations on Room Air at Rest = 92%  Patient Saturations on Room Air while Ambulating = 92%

## 2017-05-08 NOTE — Progress Notes (Signed)
Nutrition Education Consult  RD consulted for renal and diabetes diet education.   Lab Results  Component Value Date   HGBA1C 11.7 (H) 05/05/2017   Discussed different food groups and their effects on blood sugar, emphasizing carbohydrate-containing foods. Discussed importance of controlled and consistent carbohydrate intake throughout the day. Provided examples of ways to balance meals/snacks.  Explained why renal diet restrictions are needed and provided lists of foods to limit/avoid that are high potassium, sodium, and phosphorus. Provided specific recommendations on safer alternatives of these foods. Strongly encouraged compliance of this diet.   Discussed importance of protein intake at each meal and snack. Provided examples of how to maximize protein intake throughout the day. Discussed need for fluid restriction with dialysis, importance of minimizing weight gain between HD treatments, and renal-friendly beverage options.  Encouraged pt to discuss specific diet questions/concerns with RD at HD outpatient facility. Teach back method used.  Expect poor compliance.  Body mass index is 31.08 kg/m. Pt meets criteria for obesity based on current BMI.  Current diet order is renal, CHO modified, patient is consuming approximately 100% of meals at this time. Labs and medications reviewed. No further nutrition interventions warranted at this time. RD contact information provided. If additional nutrition issues arise, please re-consult RD.  Molli Barrows, RD, LDN, [place] Pager [phone] After Hours Pager [phone]

## 2017-05-08 NOTE — Progress Notes (Signed)
ANTICOAGULATION CONSULT NOTE -  Follow up  Pharmacy Consult for Coumadin  Indication: h/o  atrial fibrillation  No Known Allergies  Patient Measurements: Height: 6\' 6"  (198.1 cm) Weight: (P) 268 lb 15.4 oz (122 kg) IBW/kg (Calculated) : 91.4 Heparin Dosing Weight: 117.5 kg  Vital Signs: Temp: 98 F (36.7 C) (05/10 1235) Temp Source: Oral (05/10 1235) BP: 108/60 (05/10 1235) Pulse Rate: 116 (05/10 1235)  Labs:  Recent Labs  05/05/17 2332 05/06/17 0429 05/06/17 1104 05/07/17 0640 05/08/17 0336 05/08/17 0900  HGB 9.1* 9.2*  --   --   --  10.2*  HCT 27.8* 28.4*  --   --   --  30.8*  PLT 243 230  --   --   --  335  LABPROT  --  30.7*  --  23.6* 23.3*  --   INR  --  2.87  --  2.07 2.03  --   CREATININE 10.28* 7.31*  --   --   --  9.07*  TROPONINI 0.85* 0.82* 0.80*  --   --   --     Estimated Creatinine Clearance: 14.3 mL/min (A) (by C-G formula based on SCr of 9.07 mg/dL (H)).  Assessment: 40 YOM who continues on warfarin for hx Afib.   INR today remains therapeutic (INR 2.03 << 2.07). CBC stable - no bleeding noted at this time.   PTA coumadin dose : 10 mg/day EXCEPT for 7.5 mg on Sat/Sun  Goal of Therapy:  INR 2-3 Monitor platelets by anticoagulation protocol: Yes   Plan:  1. Warfarin 10 mg x 1 dose at 1800 today 2. Will continue to monitor for any signs/symptoms of bleeding and will follow up with PT/INR in the a.m.   Thank you for allowing pharmacy to be a part of this patient's care.  Alycia Rossetti, PharmD, BCPS Clinical Pharmacist Pager: [phone] Clinical phone for 05/08/2017 from 7a-3:30p: [phone] If after 3:30p, please call main pharmacy at: [phone] 05/08/2017 12:54 PM

## 2017-05-08 NOTE — Progress Notes (Signed)
Pt had a 4 beat run of v tac. He's resting comfortably with no complaints. Will continue to monitor closely.

## 2017-05-18 NOTE — Discharge Summary (Signed)
Physician Discharge Summary  Danil Wedge KYH:[id number] DOB: 21-Oct-1966 DOA: 05/05/2017  PCP: Patient, No Pcp Per  Admit date: 05/05/2017 Discharge date: 05/08/2017  Admitted From:  Home.  Disposition: Home.   Recommendations for Outpatient Follow-up:  1. Follow up with PCP in 1-2 weeks 2. Please obtain BMP/CBC in one week    Discharge Condition: stable.  CODE STATUS:fll code.  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 history of SVT and palpitations,  Presented with worsening shortness of breath and orthopnea for the past 2 days. He was found to be fluid overloaded, hypoxic, requiring oxygen  and admitted for HD.   Discharge Diagnoses:  Active Problems:   Hypertensive heart disease with end stage renal disease on dialysis ([employer])   ESRD on dialysis ([place])   Type 2 diabetes mellitus with complication (H[facility])   HIV disease ([facility])   Fluid overload   Elevated troponin   Pulmonary edema   ESRD (end stage renal disease) ([facility])  Pulmonary edema, fluid overload leading to acute respiratory failure with hypoxia.  Has not missed HD .  HD ON 5/8  And we were able to wean him off the oxygen.  Appreciate nephrology and cardiology recommendations.  He was discharged home to follow up with cardiology as recommended.    Type 2 DM:  Not well controlled.  hgba1c is greater than 11.  Added novolog 10 units tidac. And increased lantus to 30 units daily.  He was discharged on 30 units of lantus and SSI.      HYPERTENSION;  Much better.  No change in meds.    HIV disease:  Resume hom meds.   Chronic afib: rate controlled. Resume coumadin as per pharmacy. Therapeutic INR.    Elevated troponin probably from demand ischemia from fluid overload.  No further intervention at this time.  No chest pain.        Discharge  Instructions  Discharge Instructions    Diet - low sodium heart healthy    Complete by:  As directed    Discharge instructions    Complete by:  As directed    Follow up with ID as soon as possible to start the new HIV meds in the clinic.  Please follow up with nephrology as recommended.     Allergies as of 05/08/2017   No Known Allergies     Medication List    STOP taking these medications   LANTUS SOLOSTAR 100 UNIT/ML Solostar Pen Generic drug:  Insulin Glargine Replaced by:  insulin glargine 100 UNIT/ML injection     TAKE these medications   amLODipine 10 MG tablet Commonly known as:  NORVASC Take 10 mg by mouth daily.   aspirin EC 81 MG tablet Take 81 mg by mouth daily.   cinacalcet 60 MG tablet Commonly known as:  SENSIPAR Take 60 mg by mouth daily.   doxercalciferol 4 MCG/2ML injection Commonly known as:  HECTOROL Inject 2.5 mLs (5 mcg total) into the vein Every Tuesday,Thursday,and Saturday with dialysis.   ferric gluconate 125 mg in sodium chloride 0.9 % 100 mL Inject 125 mg into the vein Every Tuesday,Thursday,and Saturday with dialysis.   ferrous sulfate 325 (65 FE) MG tablet Take 325 mg by mouth 3 (three) times daily with meals.   fosamprenavir 700 MG tablet Commonly known as:  LEXIVA Take 700 mg by mouth 2 (two) times daily.   hydrALAZINE  10 MG tablet Commonly known as:  APRESOLINE Take 10 mg by mouth 3 (three) times daily.   insulin aspart 100 UNIT/ML injection Commonly known as:  novoLOG CBG 70 - 120: 0 units CBG 121 - 150: 3 units CBG 151 - 200: 4 units CBG 201 - 250: 7 units CBG 251 - 300: 11 units CBG 301 - 350: 15 units CBG 351 - 400: 20 units   insulin glargine 100 UNIT/ML injection Commonly known as:  LANTUS Inject 0.3 mLs (30 Units total) into the skin daily at 10 pm. Replaces:  LANTUS SOLOSTAR 100 UNIT/ML Solostar Pen   Insulin Syringes (Disposable) U-100 0.5 ML Misc 90 Syringes by Does not apply route 3 (three) times daily before  meals.   lisinopril 40 MG tablet Commonly known as:  PRINIVIL,ZESTRIL Take 40 mg by mouth daily.   metoprolol tartrate 100 MG tablet Commonly known as:  LOPRESSOR Take 100 mg by mouth daily.   multivitamin tablet Take 1 tablet by mouth daily.   TAB-A-VITE PO Take 1 tablet by mouth daily.   raltegravir 400 MG tablet Commonly known as:  ISENTRESS Take 400 mg by mouth 2 (two) times daily.   ritonavir 100 MG capsule Commonly known as:  NORVIR Take 100 mg by mouth daily with breakfast.   sevelamer carbonate 800 MG tablet Commonly known as:  RENVELA Take 800 mg by mouth 3 (three) times daily with meals.   warfarin 10 MG tablet Commonly known as:  COUMADIN Take 10 mg by mouth See admin instructions. Take 10 mg by mouth daily Monday through Friday.   warfarin 7.5 MG tablet Commonly known as:  COUMADIN Take 7.5 mg by mouth See admin instructions. Take 7.5 mg by mouth daily on Saturday and Sunday       No Known Allergies  Consultations:  Nephrology.    Procedures/Studies: Dg Chest Portable 1 View  Result Date: 05/05/2017 CLINICAL DATA:  Shortness of breath. EXAM: PORTABLE CHEST 1 VIEW COMPARISON:  None. FINDINGS: 1638 hours. Parahilar central airspace opacity is symmetric. There is fullness in the hilar regions bilaterally. The cardio pericardial silhouette is enlarged. The visualized bony structures of the thorax are intact. Telemetry leads overlie the chest. IMPRESSION: Cardiomegaly with central bilateral parahilar airspace opacity. Imaging features most suggestive of pulmonary edema. Hilar fullness bilaterally. Repeat dedicated PA and lateral films recommended after resolution of acute symptoms. Electronically Signed   By: Misty Stanley M.D.   On: 05/05/2017 16:50       Subjective: No new complaints.   Discharge Exam: Vitals:   05/08/17 1235 05/08/17 1336  BP: 108/60 113/67  Pulse: (!) 116 (!) 117  Resp: 16 16  Temp: 98 F (36.7 C) 99 F (37.2 C)   Vitals:    05/08/17 1130 05/08/17 1200 05/08/17 1235 05/08/17 1336  BP: (!) 97/46 (!) 90/53 108/60 113/67  Pulse: (!) 112 (!) 126 (!) 116 (!) 117  Resp: 15 15 16 16   Temp:   98 F (36.7 C) 99 F (37.2 C)  TempSrc:   Oral Oral  SpO2: 97% 96% 97% 97%  Weight:   122 kg (268 lb 15.4 oz)   Height:        General: Pt is alert, awake, not in acute distress Cardiovascular: RRR, S1/S2 +, no rubs, no gallops Respiratory: CTA bilaterally, no wheezing, no rhonchi Abdominal: Soft, NT, ND, bowel sounds + Extremities: no edema, no cyanosis    The results of significant diagnostics from this hospitalization (including imaging, microbiology, ancillary  and laboratory) are listed below for reference.     Microbiology: No results found for this or any previous visit (from the past 240 hour(s)).   Labs: BNP (last 3 results)  Recent Labs  05/05/17 1636  BNP 1,007.1*   Basic Metabolic Panel: No results for input(s): NA, K, CL, CO2, GLUCOSE, BUN, CREATININE, CALCIUM, MG, PHOS in the last 168 hours. Liver Function Tests: No results for input(s): AST, ALT, ALKPHOS, BILITOT, PROT, ALBUMIN in the last 168 hours. No results for input(s): LIPASE, AMYLASE in the last 168 hours. No results for input(s): AMMONIA in the last 168 hours. CBC: No results for input(s): WBC, NEUTROABS, HGB, HCT, MCV, PLT in the last 168 hours. Cardiac Enzymes: No results for input(s): CKTOTAL, CKMB, CKMBINDEX, TROPONINI in the last 168 hours. BNP: Invalid input(s): POCBNP CBG: No results for input(s): GLUCAP in the last 168 hours. D-Dimer No results for input(s): DDIMER in the last 72 hours. Hgb A1c No results for input(s): HGBA1C in the last 72 hours. Lipid Profile No results for input(s): CHOL, HDL, LDLCALC, TRIG, CHOLHDL, LDLDIRECT in the last 72 hours. Thyroid function studies No results for input(s): TSH, T4TOTAL, T3FREE, THYROIDAB in the last 72 hours.  Invalid input(s): FREET3 Anemia work up No results for  input(s): VITAMINB12, FOLATE, FERRITIN, TIBC, IRON, RETICCTPCT in the last 72 hours. Urinalysis No results found for: COLORURINE, APPEARANCEUR, LABSPEC, [place], GLUCOSEU, HGBUR, BILIRUBINUR, KETONESUR, PROTEINUR, UROBILINOGEN, NITRITE, LEUKOCYTESUR Sepsis Labs Invalid input(s): PROCALCITONIN,  WBC,  LACTICIDVEN Microbiology No results found for this or any previous visit (from the past 240 hour(s)).   Time coordinating discharge: Over 30 minutes  SIGNED:   Hosie Poisson, MD  [facility] 05/18/2017, 11:14 PM Pager   If 7PM-7AM, please contact night-coverage [email or web address] Password [email or web address]

## 2017-05-21 ENCOUNTER — Ambulatory Visit: Payer: Medicare Other

## 2017-05-28 ENCOUNTER — Ambulatory Visit (INDEPENDENT_AMBULATORY_CARE_PROVIDER_SITE_OTHER): Payer: Medicare Other | Admitting: Pharmacist

## 2017-05-28 DIAGNOSIS — B20 Human immunodeficiency virus [HIV] disease: Secondary | ICD-10-CM | POA: Diagnosis present

## 2017-05-28 NOTE — Progress Notes (Signed)
HPI: Thomas [name] is a 51 y.o. male who presents to the [facility] clinic today to follow-up for his HIV.   Allergies: No Known Allergies  Past Medical History: Past Medical History:  Diagnosis Date  . Abdominal pain   . Anemia   . Diabetes ([place])   . ESRD (end stage renal disease) ([place])   . ESRD on dialysis ([place]) 03/13/2017  . Fatigue   . HIV disease ([place])   . Hyperparathyroidism, secondary ([place])   . Hypertension   . Hypertensive heart disease with end stage renal disease on dialysis ([place]) 03/13/2017  . Pulmonary congestion   . SOB (shortness of breath)   . SVT (supraventricular tachycardia) ([place])    ? afib or atrial flutter s/p TEE/DCCV with subsequent ablation due to reoccurrence in [place] History: Social History   Social History  . Marital status: Married    Spouse name: kim  . Number of children: 3  . Years of education: college   Occupational History  . disabled    Social History Main Topics  . Smoking status: Never Smoker  . Smokeless tobacco: Never Used  . Alcohol use No  . Drug use: No  . Sexual activity: Not on file   Other Topics Concern  . Not on file   Social History Narrative  . No narrative on file    Current Regimen: Isentress + Lexiva  Labs: HIV 1 RNA Quant (copies/mL)  Date Value  03/10/2017 30 (H)   CD4 T Cell Abs (/uL)  Date Value  05/05/2017 310 (L)  03/10/2017 420   Hep B S Ab (no units)  Date Value  03/10/2017 POS (A)   Hepatitis B Surface Ag (no units)  Date Value  03/10/2017 NEGATIVE   HCV Ab (no units)  Date Value  03/10/2017 NEGATIVE    CrCl: CrCl cannot be calculated (Unknown ideal weight.).  Lipids:    Component Value Date/Time   CHOL 141 03/10/2017 1217   TRIG 284 (H) 03/10/2017 1217   HDL 22 (L) 03/10/2017 1217   CHOLHDL 6.4 (H) 03/10/2017 1217   VLDL 57 (H) 03/10/2017 1217   LDLCALC 62 03/10/2017 1217    Assessment: Thomas [name] is here today for HIV follow-up.  I called him to come and see me to  construct a new HIV regimen for him that does not contain Lexiva. He is very adamant today about not switching his HIV medications.  He wishes to stay on Isentress and Lexiva and says they are working very well for him, he is undetectable, and has a good routine with taking them. He really does not want to switch.  I told him that was completely fine and that we will not switch them for now.  While talking to him, he did mention that his insurance in [place] is cutting him off tomorrow (5/31).  He just got a shipment of his medications, but will be unable to get anything after end of June.  He has medicare but needs a part D. We met with Thomas [name] while he was here, but there is nothing we can do for him as he just needs to get a Part D. He is going to apply for [place].  He will call me when he get approval and we can do SPAP or PAF to cover his copays at that time. I also made a f/u with Thomas [name] him.  Plans: - Continue Isentress and Lexiva for now (Thomas [name] may have more luck  switching them than I did) - Call me with new insurance  - F/u with Thomas [name] 7/11 at 2:30pm  Thomas [name] Thomas [name], PharmD, [facility] 05/28/2017, 3:12 PM

## 2017-06-02 ENCOUNTER — Encounter: Payer: Self-pay | Admitting: Licensed Clinical Social Worker

## 2017-07-09 ENCOUNTER — Ambulatory Visit: Payer: Medicare Other | Admitting: Internal Medicine

## 2017-07-25 ENCOUNTER — Inpatient Hospital Stay (HOSPITAL_COMMUNITY)
Admission: EM | Admit: 2017-07-25 | Discharge: 2017-07-30 | DRG: 368 | Disposition: A | Discharge status: Home / Self Care | Payer: Medicare Other | Attending: Family Medicine | Admitting: Family Medicine

## 2017-07-25 ENCOUNTER — Encounter (HOSPITAL_COMMUNITY): Payer: Self-pay | Admitting: Emergency Medicine

## 2017-07-25 DIAGNOSIS — Z992 Dependence on renal dialysis: Secondary | ICD-10-CM

## 2017-07-25 DIAGNOSIS — Z794 Long term (current) use of insulin: Secondary | ICD-10-CM | POA: Diagnosis not present

## 2017-07-25 DIAGNOSIS — K6389 Other specified diseases of intestine: Secondary | ICD-10-CM | POA: Diagnosis present

## 2017-07-25 DIAGNOSIS — I700 Atherosclerosis of aorta: Secondary | ICD-10-CM | POA: Diagnosis present

## 2017-07-25 DIAGNOSIS — Z89432 Acquired absence of left foot: Secondary | ICD-10-CM

## 2017-07-25 DIAGNOSIS — Z79899 Other long term (current) drug therapy: Secondary | ICD-10-CM | POA: Diagnosis not present

## 2017-07-25 DIAGNOSIS — H547 Unspecified visual loss: Secondary | ICD-10-CM | POA: Diagnosis present

## 2017-07-25 DIAGNOSIS — I480 Paroxysmal atrial fibrillation: Secondary | ICD-10-CM | POA: Diagnosis present

## 2017-07-25 DIAGNOSIS — I132 Hypertensive heart and chronic kidney disease with heart failure and with stage 5 chronic kidney disease, or end stage renal disease: Secondary | ICD-10-CM | POA: Diagnosis present

## 2017-07-25 DIAGNOSIS — N2581 Secondary hyperparathyroidism of renal origin: Secondary | ICD-10-CM | POA: Diagnosis present

## 2017-07-25 DIAGNOSIS — K226 Gastro-esophageal laceration-hemorrhage syndrome: Secondary | ICD-10-CM | POA: Diagnosis present

## 2017-07-25 DIAGNOSIS — Z21 Asymptomatic human immunodeficiency virus [HIV] infection status: Secondary | ICD-10-CM | POA: Diagnosis present

## 2017-07-25 DIAGNOSIS — E1151 Type 2 diabetes mellitus with diabetic peripheral angiopathy without gangrene: Secondary | ICD-10-CM | POA: Diagnosis present

## 2017-07-25 DIAGNOSIS — Z7901 Long term (current) use of anticoagulants: Secondary | ICD-10-CM | POA: Diagnosis not present

## 2017-07-25 DIAGNOSIS — K922 Gastrointestinal hemorrhage, unspecified: Secondary | ICD-10-CM | POA: Diagnosis present

## 2017-07-25 DIAGNOSIS — E118 Type 2 diabetes mellitus with unspecified complications: Secondary | ICD-10-CM | POA: Diagnosis present

## 2017-07-25 DIAGNOSIS — Z89512 Acquired absence of left leg below knee: Secondary | ICD-10-CM

## 2017-07-25 DIAGNOSIS — D62 Acute posthemorrhagic anemia: Secondary | ICD-10-CM | POA: Diagnosis present

## 2017-07-25 DIAGNOSIS — K559 Vascular disorder of intestine, unspecified: Secondary | ICD-10-CM | POA: Diagnosis present

## 2017-07-25 DIAGNOSIS — D631 Anemia in chronic kidney disease: Secondary | ICD-10-CM | POA: Diagnosis present

## 2017-07-25 DIAGNOSIS — E1122 Type 2 diabetes mellitus with diabetic chronic kidney disease: Secondary | ICD-10-CM | POA: Diagnosis present

## 2017-07-25 DIAGNOSIS — N186 End stage renal disease: Secondary | ICD-10-CM | POA: Diagnosis present

## 2017-07-25 DIAGNOSIS — Z7982 Long term (current) use of aspirin: Secondary | ICD-10-CM | POA: Diagnosis not present

## 2017-07-25 DIAGNOSIS — I5042 Chronic combined systolic (congestive) and diastolic (congestive) heart failure: Secondary | ICD-10-CM | POA: Diagnosis present

## 2017-07-25 DIAGNOSIS — B20 Human immunodeficiency virus [HIV] disease: Secondary | ICD-10-CM | POA: Diagnosis present

## 2017-07-25 DIAGNOSIS — D649 Anemia, unspecified: Secondary | ICD-10-CM | POA: Diagnosis present

## 2017-07-25 LAB — COMPREHENSIVE METABOLIC PANEL
ALK PHOS: 103 U/L (ref 38–126)
ALT: 16 U/L — AB (ref 17–63)
AST: 19 U/L (ref 15–41)
Albumin: 3.4 g/dL — ABNORMAL LOW (ref 3.5–5.0)
Anion gap: 15 (ref 5–15)
BUN: 41 mg/dL — ABNORMAL HIGH (ref 6–20)
CALCIUM: 8.7 mg/dL — AB (ref 8.9–10.3)
CHLORIDE: 95 mmol/L — AB (ref 101–111)
CO2: 26 mmol/L (ref 22–32)
CREATININE: 8.2 mg/dL — AB (ref 0.61–1.24)
GFR calc Af Amer: 8 mL/min — ABNORMAL LOW (ref >60)
GFR calc non Af Amer: 7 mL/min — ABNORMAL LOW (ref >60)
GLUCOSE: 203 mg/dL — AB (ref 65–99)
Potassium: 4 mmol/L (ref 3.5–5.1)
SODIUM: 136 mmol/L (ref 135–145)
Total Bilirubin: 0.5 mg/dL (ref 0.3–1.2)
Total Protein: 6.7 g/dL (ref 6.5–8.1)

## 2017-07-25 LAB — CBC
HCT: 24.2 % — ABNORMAL LOW (ref 39.0–52.0)
HEMOGLOBIN: 7.9 g/dL — AB (ref 13.0–17.0)
MCH: 30.6 pg (ref 26.0–34.0)
MCHC: 32.6 g/dL (ref 30.0–36.0)
MCV: 93.8 fL (ref 78.0–100.0)
Platelets: 202 10*3/uL (ref 150–400)
RBC: 2.58 MIL/uL — AB (ref 4.22–5.81)
RDW: 17.7 % — ABNORMAL HIGH (ref 11.5–15.5)
WBC: 7.2 10*3/uL (ref 4.0–10.5)

## 2017-07-25 LAB — ABO/RH: ABO/RH(D): B POS

## 2017-07-25 LAB — PREPARE RBC (CROSSMATCH)

## 2017-07-25 MED ORDER — SODIUM CHLORIDE 0.9 % IV SOLN
10.0000 mL/h | Freq: Once | INTRAVENOUS | Status: AC
  Administered 2017-07-26: 10 mL/h via INTRAVENOUS

## 2017-07-25 NOTE — ED Triage Notes (Signed)
Pt reports 2 episodes of rectal bleeding, also endorses dizziness. Pt BP soft in triage.

## 2017-07-25 NOTE — ED Provider Notes (Signed)
[facility] DEPT Provider Note   CSN: [id number] Arrival date & time: 07/25/17  2149 By signing my name below, I, Dyke Brackett, attest that this documentation has been prepared under the direction and in the presence of [place], Fredia Sorrow, MD . Electronically Signed: Dyke Brackett, Scribe. 07/25/2017. 11:49 PM.   History   Chief Complaint Chief Complaint  Patient presents with  . Blood In Stools   HPI Thomas [name] is a 51 y.o. male with a history of ESRD on dialysis, HIV, HTN, and A-fib on Warfarin  who presents to the Emergency Department complaining of 3 episodes of rectal bleeding today onset at 3 pm. Per pt, he had 3 episodes of bright red blood in his stool where he lost a significant amount of blood. He reports associated dizziness onset a few days ago. No alleviating or modifying factors noted.  Per pt, he was extremely dizzy at dialysis yesterday and had an episode of vision loss. He was taken off his HTN medication yesterday due to persistent hypotension. He reports that his INR levels were elevated last week. Pt is not currently followed by a gastroenterologist. Pt has no other acute complaints or associated symptoms at this time.    The history is provided by the patient. No language interpreter was used.    Past Medical History:  Diagnosis Date  . Abdominal pain   . Anemia   . Diabetes ([place])   . ESRD (end stage renal disease) ([place])   . ESRD on dialysis ([place]) 03/13/2017  . Fatigue   . HIV disease ([place])   . Hyperparathyroidism, secondary ([place])   . Hypertension   . Hypertensive heart disease with end stage renal disease on dialysis ([place]) 03/13/2017  . Pulmonary congestion   . SOB (shortness of breath)   . SVT (supraventricular tachycardia) ([place])    ? afib or atrial flutter s/p TEE/DCCV with subsequent ablation due to reoccurrence in [place]    Patient Active Problem List   Diagnosis Date Noted  . GIB (gastrointestinal bleeding) 07/26/2017  . Anemia due to end stage  renal disease ([place]) 07/26/2017  . PAF (paroxysmal atrial fibrillation) ([place]) 07/26/2017  . Symptomatic anemia 07/26/2017  . Fluid overload 05/05/2017  . Elevated troponin 05/05/2017  . Pulmonary edema 05/05/2017  . ESRD (end stage renal disease) ([place]) 05/05/2017  . HIV disease ([place]) 03/25/2017  . Hypertensive heart disease with end stage renal disease on dialysis ([place]) 03/13/2017  . ESRD on dialysis ([place]) 03/13/2017  . Type 2 diabetes mellitus with complication ([place]) 67/61/9509  . SVT (supraventricular tachycardia) ([facility])     Past Surgical History:  Procedure Laterality Date  . AMPUTATION Left    foot  . RIGHT AV FISTULA PLACEMENT  10/19/2012       Home Medications    Prior to Admission medications   Medication Sig Start Date End Date Taking? Authorizing Provider  amLODipine (NORVASC) 10 MG tablet Take 10 mg by mouth daily.   Yes [provider]  aspirin EC 81 MG tablet Take 81 mg by mouth daily.   Yes [provider]  cinacalcet (SENSIPAR) 60 MG tablet Take 60 mg by mouth daily.   Yes [provider]  doxercalciferol (HECTOROL) 4 MCG/2ML injection Inject 2.5 mLs (5 mcg total) into the vein Every Tuesday,Thursday,and Saturday with dialysis. 05/10/17  Yes Hosie Poisson, MD  ferric gluconate 125 mg in sodium chloride 0.9 % 100 mL Inject 125 mg into the vein Every Tuesday,Thursday,and Saturday with dialysis. 05/10/17  Yes Karleen Hampshire,  Vijaya, MD  ferrous sulfate 325 (65 FE) MG tablet Take 325 mg by mouth 3 (three) times daily with meals.   Yes [provider]  fosamprenavir (LEXIVA) 700 MG tablet Take 700 mg by mouth 2 (two) times daily.    Yes [provider]  insulin glargine (LANTUS) 100 UNIT/ML injection Inject 0.3 mLs (30 Units total) into the skin daily at 10 pm. 05/08/17  Yes Hosie Poisson, MD  lisinopril (PRINIVIL,ZESTRIL) 40 MG tablet Take 40 mg by mouth daily.   Yes [provider]  metoprolol (LOPRESSOR) 100 MG tablet Take 50 mg  by mouth daily.    Yes [provider]  Multiple Vitamin (MULTIVITAMIN) tablet Take 1 tablet by mouth daily.   Yes [provider]  Multiple Vitamin (TAB-A-VITE PO) Take 1 tablet by mouth daily.   Yes [provider]  raltegravir (ISENTRESS) 400 MG tablet Take 400 mg by mouth 2 (two) times daily.   Yes [provider]  ritonavir (NORVIR) 100 MG capsule Take 100 mg by mouth daily with breakfast.   Yes [provider]  sevelamer carbonate (RENVELA) 800 MG tablet Take 800 mg by mouth 3 (three) times daily with meals.   Yes [provider]  warfarin (COUMADIN) 10 MG tablet Take 10 mg by mouth See admin instructions. Take 10 mg by mouth daily Monday through Friday.   Yes [provider]  warfarin (COUMADIN) 7.5 MG tablet Take 7.5 mg by mouth See admin instructions. Take 7.5 mg by mouth daily on Saturday and Sunday   Yes [provider]  insulin aspart (NOVOLOG) 100 UNIT/ML injection CBG 70 - 120: 0 units CBG 121 - 150: 3 units CBG 151 - 200: 4 units CBG 201 - 250: 7 units CBG 251 - 300: 11 units CBG 301 - 350: 15 units CBG 351 - 400: 20 units Patient not taking: Reported on 07/26/2017 05/08/17   Hosie Poisson, MD    Family History Family History  Problem Relation Age of Onset  . Heart disease Mother   . Diabetes Mother   . Heart disease Father   . Cancer Neg Hx     Social History Social History  Substance Use Topics  . Smoking status: Never Smoker  . Smokeless tobacco: Never Used  . Alcohol use No     Allergies   Patient has no known allergies.   Review of Systems Review of Systems  Gastrointestinal: Positive for anal bleeding and blood in stool.  Neurological: Positive for dizziness.  All other systems reviewed and are negative.  Physical Exam Updated Vital Signs BP 124/78   Pulse 89   Temp 98.6 F (37 C) (Oral)   Resp 17   Ht 6\' 6"  (1.981 m)   Wt 123.6 kg (272 lb 7.8 oz)   SpO2 98%   BMI 31.49  kg/m   Physical Exam  Constitutional: He is oriented to person, place, and time. He appears well-developed and well-nourished.  Well-appearing. Comfortable, lounging, eating skittles in the room  HENT:  Head: Normocephalic and atraumatic.  Eyes: EOM are normal.  Neck: Normal range of motion.  Cardiovascular: Regular rhythm, normal heart sounds and intact distal pulses.  Tachycardia present.   Pulmonary/Chest: Effort normal and breath sounds normal. No respiratory distress. He has no rales.  Lungs CTA bilaterally  Abdominal: Soft. He exhibits no distension. There is no tenderness.  Musculoskeletal: Normal range of motion.  Left BKA  Neurological: He is alert and oriented to person, place, and  time.  Skin: Skin is warm and dry.  Psychiatric: He has a normal mood and affect. Judgment normal.  Nursing note and vitals reviewed.  ED Treatments / Results  DIAGNOSTIC STUDIES:  Oxygen Saturation is 96% on RA, normal by my interpretation.    COORDINATION OF CARE:  11:45 PM Will order Protime-INR. Discussed treatment plan with pt at bedside and pt agreed to plan.  Labs (all labs ordered are listed, but only abnormal results are displayed) Labs Reviewed  COMPREHENSIVE METABOLIC PANEL - Abnormal; Notable for the following:       Result Value   Chloride 95 (*)    Glucose, Bld 203 (*)    BUN 41 (*)    Creatinine, Ser 8.20 (*)    Calcium 8.7 (*)    Albumin 3.4 (*)    ALT 16 (*)    GFR calc non Af Amer 7 (*)    GFR calc Af Amer 8 (*)    All other components within normal limits  CBC - Abnormal; Notable for the following:    RBC 2.58 (*)    Hemoglobin 7.9 (*)    HCT 24.2 (*)    RDW 17.7 (*)    All other components within normal limits  PROTIME-INR - Abnormal; Notable for the following:    Prothrombin Time 24.2 (*)    All other components within normal limits  CBC - Abnormal; Notable for the following:    RBC 2.76 (*)    Hemoglobin 8.3 (*)    HCT 24.9 (*)    RDW 18.6 (*)    All  other components within normal limits  BASIC METABOLIC PANEL - Abnormal; Notable for the following:    Chloride 99 (*)    Glucose, Bld 158 (*)    BUN 45 (*)    Creatinine, Ser 8.80 (*)    Calcium 8.2 (*)    GFR calc non Af Amer 6 (*)    GFR calc Af Amer 7 (*)    All other components within normal limits  GLUCOSE, CAPILLARY - Abnormal; Notable for the following:    Glucose-Capillary 156 (*)    All other components within normal limits  MRSA PCR SCREENING  CBC  CBC  POC OCCULT BLOOD, ED  TYPE AND SCREEN  ABO/RH  PREPARE RBC (CROSSMATCH)    EKG  EKG Interpretation  Date/Time:  Friday July 25 2017 21:56:53 EDT Ventricular Rate:  106 PR Interval:  212 QRS Duration: 100 QT Interval:  356 QTC Calculation: 472 R Axis:   49 Text Interpretation:  Sinus tachycardia with 1st degree A-V block with Premature atrial complexes Otherwise normal ECG tachycardia noted Confirmed by Thomasene Lot, [place] ([id number]) on 07/25/2017 11:51:30 PM       Radiology No results found.  Procedures Procedures (including critical care time)  Medications Ordered in ED Medications  ferric gluconate (NULECIT) 125 mg in sodium chloride 0.9 % 100 mL IVPB (not administered)  multivitamin with minerals tablet 1 tablet (not administered)  cinacalcet (SENSIPAR) tablet 60 mg (not administered)  fosamprenavir (LEXIVA) tablet 700 mg (not administered)  metoprolol tartrate (LOPRESSOR) tablet 50 mg (not administered)  raltegravir (ISENTRESS) tablet 400 mg (not administered)  ritonavir (NORVIR) capsule 100 mg (not administered)  sevelamer carbonate (RENVELA) tablet 800 mg (not administered)  hydrALAZINE (APRESOLINE) injection 5 mg (not administered)  ondansetron (ZOFRAN) injection 4 mg (not administered)  insulin aspart (novoLOG) injection 0-9 Units (not administered)  ferrous sulfate tablet 325 mg (not administered)  insulin glargine (LANTUS) injection 20  Units (not administered)  acetaminophen (TYLENOL) tablet  650 mg (not administered)    Or  acetaminophen (TYLENOL) suppository 650 mg (not administered)  zolpidem (AMBIEN) tablet 5 mg (not administered)  pantoprazole (PROTONIX) 80 mg in sodium chloride 0.9 % 250 mL (0.32 mg/mL) infusion (8 mg/hr Intravenous Transfusing/Transfer 07/26/17 0520)  pantoprazole (PROTONIX) injection 40 mg (not administered)  0.9 %  sodium chloride infusion (10 mL/hr Intravenous New Bag/Given 07/26/17 0008)  phytonadione (VITAMIN K) 5 mg in dextrose 5 % 50 mL IVPB (0 mg Intravenous Stopped 07/26/17 0304)  pantoprazole (PROTONIX) 80 mg in sodium chloride 0.9 % 100 mL IVPB (0 mg Intravenous Stopped 07/26/17 0305)     Initial Impression / Assessment and Plan / ED Course  I have reviewed the triage vital signs and the nursing notes.  Pertinent labs & imaging results that were available during my care of the patient were reviewed by me and considered in my medical decision making (see chart for details).    I personally performed the services described in this documentation, which was scribed in my presence. The recorded information has been reviewed and is accurate.   Patient's 51 year old male end-stage renal disease on dialysis with diabetes hypertension and left BKA presenting today with bleeding per rectum. Patient is reported 3 episodes since 3 PM last night. Patient had noted that he was having low blood pressures already this week and been taken off his hypertensive medications by dialysis. On arrival here patient has soft blood pressures, tachycardia. No evidence of fluid overload. Patient is due for dialysis tomorrow. Patient's hemoglobin dropped from 10 to 7.9. We'll transfuse.  Patient on warfarin for A. fib. However patient reports he is no longer in A. fib since he had an ablation. He is unsure why they kept him on warfarin. INR is 2.03   Will admit for bleeding.  CRITICAL CARE Performed by: Gardiner Sleeper Total critical care time: 50 minutes Critical care  time was exclusive of separately billable procedures and treating other patients. Critical care was necessary to treat or prevent imminent or life-threatening deterioration. Critical care was time spent personally by me on the following activities: development of treatment plan with patient and/or surrogate as well as nursing, discussions with consultants, evaluation of patient's response to treatment, examination of patient, obtaining history from patient or surrogate, ordering and performing treatments and interventions, ordering and review of laboratory studies, ordering and review of radiographic studies, pulse oximetry and re-evaluation of patient's condition.  Final Clinical Impressions(s) / ED Diagnoses   Final diagnoses:  None    New Prescriptions Current Discharge Medication List       Macarthur Critchley, MD 07/26/17 [phone]

## 2017-07-26 ENCOUNTER — Inpatient Hospital Stay (HOSPITAL_COMMUNITY): Payer: Medicare Other

## 2017-07-26 DIAGNOSIS — D649 Anemia, unspecified: Secondary | ICD-10-CM

## 2017-07-26 DIAGNOSIS — E119 Type 2 diabetes mellitus without complications: Secondary | ICD-10-CM

## 2017-07-26 DIAGNOSIS — B20 Human immunodeficiency virus [HIV] disease: Secondary | ICD-10-CM

## 2017-07-26 DIAGNOSIS — D631 Anemia in chronic kidney disease: Secondary | ICD-10-CM | POA: Diagnosis not present

## 2017-07-26 DIAGNOSIS — E118 Type 2 diabetes mellitus with unspecified complications: Secondary | ICD-10-CM | POA: Diagnosis not present

## 2017-07-26 DIAGNOSIS — Z7901 Long term (current) use of anticoagulants: Secondary | ICD-10-CM

## 2017-07-26 DIAGNOSIS — I480 Paroxysmal atrial fibrillation: Secondary | ICD-10-CM | POA: Diagnosis not present

## 2017-07-26 DIAGNOSIS — Z794 Long term (current) use of insulin: Secondary | ICD-10-CM

## 2017-07-26 DIAGNOSIS — N186 End stage renal disease: Secondary | ICD-10-CM

## 2017-07-26 DIAGNOSIS — Z992 Dependence on renal dialysis: Secondary | ICD-10-CM

## 2017-07-26 DIAGNOSIS — K922 Gastrointestinal hemorrhage, unspecified: Secondary | ICD-10-CM | POA: Diagnosis not present

## 2017-07-26 DIAGNOSIS — K226 Gastro-esophageal laceration-hemorrhage syndrome: Secondary | ICD-10-CM | POA: Diagnosis not present

## 2017-07-26 LAB — GLUCOSE, CAPILLARY
GLUCOSE-CAPILLARY: 235 mg/dL — AB (ref 65–99)
GLUCOSE-CAPILLARY: 298 mg/dL — AB (ref 65–99)
Glucose-Capillary: 156 mg/dL — ABNORMAL HIGH (ref 65–99)

## 2017-07-26 LAB — BASIC METABOLIC PANEL
ANION GAP: 12 (ref 5–15)
BUN: 45 mg/dL — ABNORMAL HIGH (ref 6–20)
CO2: 26 mmol/L (ref 22–32)
Calcium: 8.2 mg/dL — ABNORMAL LOW (ref 8.9–10.3)
Chloride: 99 mmol/L — ABNORMAL LOW (ref 101–111)
Creatinine, Ser: 8.8 mg/dL — ABNORMAL HIGH (ref 0.61–1.24)
GFR calc Af Amer: 7 mL/min — ABNORMAL LOW (ref >60)
GFR calc non Af Amer: 6 mL/min — ABNORMAL LOW (ref >60)
GLUCOSE: 158 mg/dL — AB (ref 65–99)
POTASSIUM: 4 mmol/L (ref 3.5–5.1)
Sodium: 137 mmol/L (ref 135–145)

## 2017-07-26 LAB — CBC
HCT: 24.9 % — ABNORMAL LOW (ref 39.0–52.0)
HCT: 25.4 % — ABNORMAL LOW (ref 39.0–52.0)
HEMATOCRIT: 27.5 % — AB (ref 39.0–52.0)
HEMOGLOBIN: 8.4 g/dL — AB (ref 13.0–17.0)
HEMOGLOBIN: 9.1 g/dL — AB (ref 13.0–17.0)
Hemoglobin: 8.3 g/dL — ABNORMAL LOW (ref 13.0–17.0)
MCH: 30.1 pg (ref 26.0–34.0)
MCH: 29.8 pg (ref 26.0–34.0)
MCH: 29.7 pg (ref 26.0–34.0)
MCHC: 33.3 g/dL (ref 30.0–36.0)
MCHC: 33.1 g/dL (ref 30.0–36.0)
MCHC: 33.1 g/dL (ref 30.0–36.0)
MCV: 90.2 fL (ref 78.0–100.0)
MCV: 90.1 fL (ref 78.0–100.0)
MCV: 89.9 fL (ref 78.0–100.0)
PLATELETS: 173 10*3/uL (ref 150–400)
Platelets: 164 10*3/uL (ref 150–400)
Platelets: 197 10*3/uL (ref 150–400)
RBC: 2.76 MIL/uL — ABNORMAL LOW (ref 4.22–5.81)
RBC: 2.82 MIL/uL — AB (ref 4.22–5.81)
RBC: 3.06 MIL/uL — AB (ref 4.22–5.81)
RDW: 18.6 % — AB (ref 11.5–15.5)
RDW: 19 % — ABNORMAL HIGH (ref 11.5–15.5)
RDW: 19.1 % — ABNORMAL HIGH (ref 11.5–15.5)
WBC: 6.9 10*3/uL (ref 4.0–10.5)
WBC: 6.9 10*3/uL (ref 4.0–10.5)
WBC: 7.1 10*3/uL (ref 4.0–10.5)

## 2017-07-26 LAB — MRSA PCR SCREENING: MRSA by PCR: NEGATIVE

## 2017-07-26 LAB — PROTIME-INR
INR: 2.13
PROTHROMBIN TIME: 24.2 s — AB (ref 11.4–15.2)

## 2017-07-26 MED ORDER — INSULIN GLARGINE 100 UNIT/ML ~~LOC~~ SOLN
20.0000 [IU] | Freq: Every day | SUBCUTANEOUS | Status: DC
  Administered 2017-07-26 – 2017-07-29 (×3): 20 [IU] via SUBCUTANEOUS
  Filled 2017-07-26 (×5): qty 0.2

## 2017-07-26 MED ORDER — ONDANSETRON HCL 4 MG/2ML IJ SOLN: 4.0000 mg | Freq: Three times a day (TID) | INTRAMUSCULAR | Status: DC | PRN

## 2017-07-26 MED ORDER — DOXERCALCIFEROL 4 MCG/2ML IV SOLN
INTRAVENOUS | Status: AC
  Administered 2017-07-26: 7 ug via INTRAVENOUS
  Filled 2017-07-26: qty 4

## 2017-07-26 MED ORDER — CINACALCET HCL 30 MG PO TABS
60.0000 mg | ORAL_TABLET | Freq: Every day | ORAL | Status: DC
  Administered 2017-07-27 – 2017-07-30 (×2): 60 mg via ORAL
  Filled 2017-07-26 (×5): qty 2

## 2017-07-26 MED ORDER — ACETAMINOPHEN 650 MG RE SUPP: 650.0000 mg | Freq: Four times a day (QID) | RECTAL | Status: DC | PRN

## 2017-07-26 MED ORDER — SODIUM CHLORIDE 0.9 % IV SOLN
125.0000 mg | INTRAVENOUS | Status: DC
  Filled 2017-07-26: qty 10

## 2017-07-26 MED ORDER — PANTOPRAZOLE SODIUM 40 MG IV SOLR: 40.0000 mg | Freq: Two times a day (BID) | INTRAVENOUS | Status: DC

## 2017-07-26 MED ORDER — SEVELAMER CARBONATE 800 MG PO TABS
800.0000 mg | ORAL_TABLET | Freq: Three times a day (TID) | ORAL | Status: DC
  Administered 2017-07-27 – 2017-07-30 (×5): 800 mg via ORAL
  Filled 2017-07-26 (×6): qty 1

## 2017-07-26 MED ORDER — METOPROLOL TARTRATE 50 MG PO TABS
50.0000 mg | ORAL_TABLET | Freq: Every day | ORAL | Status: DC
  Administered 2017-07-27 – 2017-07-29 (×3): 50 mg via ORAL
  Filled 2017-07-26 (×3): qty 1

## 2017-07-26 MED ORDER — INSULIN ASPART 100 UNIT/ML ~~LOC~~ SOLN
0.0000 [IU] | Freq: Three times a day (TID) | SUBCUTANEOUS | Status: DC
  Administered 2017-07-27 – 2017-07-28 (×2): 3 [IU] via SUBCUTANEOUS
  Administered 2017-07-29: 2 [IU] via SUBCUTANEOUS

## 2017-07-26 MED ORDER — ONE-DAILY MULTI VITAMINS PO TABS
1.0000 | ORAL_TABLET | Freq: Every day | ORAL | Status: DC
Start: 2017-07-26 — End: 2017-07-26

## 2017-07-26 MED ORDER — ZOLPIDEM TARTRATE 5 MG PO TABS: 5.0000 mg | ORAL_TABLET | Freq: Every evening | ORAL | Status: DC | PRN

## 2017-07-26 MED ORDER — DOXERCALCIFEROL 4 MCG/2ML IV SOLN
7.0000 ug | INTRAVENOUS | Status: DC
  Administered 2017-07-26 – 2017-07-29 (×2): 7 ug via INTRAVENOUS
  Filled 2017-07-26 (×2): qty 4

## 2017-07-26 MED ORDER — FERROUS SULFATE 325 (65 FE) MG PO TABS: 325.0000 mg | ORAL_TABLET | Freq: Three times a day (TID) | ORAL | Status: DC

## 2017-07-26 MED ORDER — RITONAVIR 100 MG PO CAPS
100.0000 mg | ORAL_CAPSULE | Freq: Every day | ORAL | Status: DC
  Administered 2017-07-27 – 2017-07-29 (×3): 100 mg via ORAL
  Filled 2017-07-26 (×5): qty 1

## 2017-07-26 MED ORDER — DEXTROSE 5 % IV SOLN
5.0000 mg | Freq: Once | INTRAVENOUS | Status: AC
  Administered 2017-07-26: 5 mg via INTRAVENOUS
  Filled 2017-07-26: qty 0.5

## 2017-07-26 MED ORDER — ADULT MULTIVITAMIN W/MINERALS CH
1.0000 | ORAL_TABLET | Freq: Every day | ORAL | Status: DC
  Administered 2017-07-27 – 2017-07-30 (×3): 1 via ORAL
  Filled 2017-07-26 (×4): qty 1

## 2017-07-26 MED ORDER — HYDRALAZINE HCL 20 MG/ML IJ SOLN: 5.0000 mg | INTRAMUSCULAR | Status: DC | PRN

## 2017-07-26 MED ORDER — NA FERRIC GLUC CPLX IN SUCROSE 12.5 MG/ML IV SOLN
125.0000 mg | INTRAVENOUS | Status: DC
  Administered 2017-07-29: 125 mg via INTRAVENOUS
  Filled 2017-07-26: qty 10

## 2017-07-26 MED ORDER — SODIUM CHLORIDE 0.9 % IV SOLN: 62.5000 mg | INTRAVENOUS | Status: DC

## 2017-07-26 MED ORDER — ACETAMINOPHEN 325 MG PO TABS
650.0000 mg | ORAL_TABLET | Freq: Four times a day (QID) | ORAL | Status: DC | PRN
  Administered 2017-07-29 – 2017-07-30 (×2): 650 mg via ORAL
  Filled 2017-07-26 (×2): qty 2

## 2017-07-26 MED ORDER — RALTEGRAVIR POTASSIUM 400 MG PO TABS
400.0000 mg | ORAL_TABLET | Freq: Two times a day (BID) | ORAL | Status: DC
  Administered 2017-07-26 – 2017-07-30 (×8): 400 mg via ORAL
  Filled 2017-07-26 (×12): qty 1

## 2017-07-26 MED ORDER — SODIUM CHLORIDE 0.9 % IV SOLN
80.0000 mg | Freq: Once | INTRAVENOUS | Status: AC
  Administered 2017-07-26: 03:00:00 80 mg via INTRAVENOUS
  Filled 2017-07-26: qty 80

## 2017-07-26 MED ORDER — SODIUM CHLORIDE 0.9 % IV SOLN
INTRAVENOUS | Status: DC
  Administered 2017-07-27: 05:00:00 via INTRAVENOUS

## 2017-07-26 MED ORDER — PANTOPRAZOLE SODIUM 40 MG IV SOLR
40.0000 mg | Freq: Two times a day (BID) | INTRAVENOUS | Status: DC
  Administered 2017-07-26 – 2017-07-29 (×7): 40 mg via INTRAVENOUS
  Filled 2017-07-26 (×8): qty 40

## 2017-07-26 MED ORDER — FOSAMPRENAVIR CALCIUM 700 MG PO TABS
700.0000 mg | ORAL_TABLET | Freq: Two times a day (BID) | ORAL | Status: DC
  Administered 2017-07-26 – 2017-07-29 (×7): 700 mg via ORAL
  Filled 2017-07-26 (×11): qty 1

## 2017-07-26 MED ORDER — SODIUM CHLORIDE 0.9 % IV SOLN
8.0000 mg/h | INTRAVENOUS | Status: DC
  Administered 2017-07-26: 8 mg/h via INTRAVENOUS
  Filled 2017-07-26 (×3): qty 80

## 2017-07-26 NOTE — Plan of Care (Signed)
Problem: Pain Managment: Goal: General experience of comfort will improve Outcome: Progressing Denies any pain.

## 2017-07-26 NOTE — ED Notes (Signed)
Patient signed informed consent form for blood transfusion .

## 2017-07-26 NOTE — Progress Notes (Signed)
Patient admitted from from ED with DX of GI bleed. Alert and oriented. Denies pain. Vitals stable. Telemetry applied and CCMD called. Will like to eat and not happy he is NPO. MD oncall paged.

## 2017-07-26 NOTE — Consult Note (Signed)
Referring Provider:  Dr. Dillard Cannon ([facility]) Primary Care Physician:  Patient, No Pcp Per Primary Gastroenterologist:  None (unassigned)  Reason for Consultation:  Rectal bleeding  HPI: Thomas [name] is a 51 y.o. male on dialysis for and stage renal disease, also history of chronic anticoagulation for atrial fibrillation, admitted to the hospital today because of rectal bleeding which occurred yesterday around 3 PM and consisted of 3 large-volume bloody bowel movements. Since then, he has had no further bleeding. There was associated dizziness. The patient was transfused 2 units of packed cells while on dialysis today and then sent here. He has received vitamin K. Hemoglobin today is 8.3, after transfusion of 2 units of packed cells; hemoglobin yesterday was 7.9 prior to transfusion. Baseline hemoglobin runs around 9 or 10.  The patient has never had colonoscopy. He has never had previous GI bleeding. He does take a daily aspirin as well as his Coumadin. He has not had any prodromal dyspeptic symptomatology. An abdominal CT today showed no evidence of colitis or diverticulosis. Note that the patient's BUN did not rise in association with this bleeding episode.  The patient has long-standing diabetes but no known cardiopulmonary disease.   Past Medical History:  Diagnosis Date  . Abdominal pain   . Anemia   . Diabetes ([place])   . ESRD (end stage renal disease) ([place])   . ESRD on dialysis ([place]) 03/13/2017  . Fatigue   . HIV disease ([place])   . Hyperparathyroidism, secondary ([place])   . Hypertension   . Hypertensive heart disease with end stage renal disease on dialysis ([place]) 03/13/2017  . Pulmonary congestion   . SOB (shortness of breath)   . SVT (supraventricular tachycardia) ([employer])    ? afib or atrial flutter s/p TEE/DCCV with subsequent ablation due to reoccurrence in [place]    Past Surgical History:  Procedure Laterality Date  . AMPUTATION Left    foot  . RIGHT AV FISTULA PLACEMENT   10/19/2012    Prior to Admission medications   Medication Sig Start Date End Date Taking? Authorizing Provider  amLODipine (NORVASC) 10 MG tablet Take 10 mg by mouth daily.   Yes [provider]  aspirin EC 81 MG tablet Take 81 mg by mouth daily.   Yes [provider]  cinacalcet (SENSIPAR) 60 MG tablet Take 60 mg by mouth daily.   Yes [provider]  doxercalciferol (HECTOROL) 4 MCG/2ML injection Inject 2.5 mLs (5 mcg total) into the vein Every Tuesday,Thursday,and Saturday with dialysis. 05/10/17  Yes Hosie Poisson, MD  ferric gluconate 125 mg in sodium chloride 0.9 % 100 mL Inject 125 mg into the vein Every Tuesday,Thursday,and Saturday with dialysis. 05/10/17  Yes Hosie Poisson, MD  ferrous sulfate 325 (65 FE) MG tablet Take 325 mg by mouth 3 (three) times daily with meals.   Yes [provider]  fosamprenavir (LEXIVA) 700 MG tablet Take 700 mg by mouth 2 (two) times daily.    Yes [provider]  insulin glargine (LANTUS) 100 UNIT/ML injection Inject 0.3 mLs (30 Units total) into the skin daily at 10 pm. 05/08/17  Yes Hosie Poisson, MD  lisinopril (PRINIVIL,ZESTRIL) 40 MG tablet Take 40 mg by mouth daily.   Yes [provider]  metoprolol (LOPRESSOR) 100 MG tablet Take 50 mg by mouth daily.    Yes [provider]  Multiple Vitamin (MULTIVITAMIN) tablet Take 1 tablet by mouth daily.   Yes [provider]  Multiple Vitamin (TAB-A-VITE PO) Take 1  tablet by mouth daily.   Yes [provider]  raltegravir (ISENTRESS) 400 MG tablet Take 400 mg by mouth 2 (two) times daily.   Yes [provider]  ritonavir (NORVIR) 100 MG capsule Take 100 mg by mouth daily with breakfast.   Yes [provider]  sevelamer carbonate (RENVELA) 800 MG tablet Take 800 mg by mouth 3 (three) times daily with meals.   Yes [provider]  warfarin (COUMADIN) 10 MG tablet Take 10 mg by mouth See admin instructions. Take  10 mg by mouth daily Monday through Friday.   Yes [provider]  warfarin (COUMADIN) 7.5 MG tablet Take 7.5 mg by mouth See admin instructions. Take 7.5 mg by mouth daily on Saturday and Sunday   Yes [provider]  insulin aspart (NOVOLOG) 100 UNIT/ML injection CBG 70 - 120: 0 units CBG 121 - 150: 3 units CBG 151 - 200: 4 units CBG 201 - 250: 7 units CBG 251 - 300: 11 units CBG 301 - 350: 15 units CBG 351 - 400: 20 units Patient not taking: Reported on 07/26/2017 05/08/17   Hosie Poisson, MD    Current Facility-Administered Medications  Medication Dose Route Frequency Provider Last Rate Last Dose  . acetaminophen (TYLENOL) tablet 650 mg  650 mg Oral Q6H PRN Ivor Costa, MD       Or  . acetaminophen (TYLENOL) suppository 650 mg  650 mg Rectal Q6H PRN Ivor Costa, MD      . cinacalcet (SENSIPAR) tablet 60 mg  60 mg Oral Q breakfast Ivor Costa, MD      . doxercalciferol (HECTOROL) injection 7 mcg  7 mcg Intravenous Q T,Th,Sa-HD Ernest Haber, PA-C      . [START ON 07/29/2017] ferric gluconate (NULECIT) 125 mg in sodium chloride 0.9 % 100 mL IVPB  125 mg Intravenous Q T,Th,Sa-HD Zeyfang, David, PA-C      . fosamprenavir (LEXIVA) tablet 700 mg  700 mg Oral BID Ivor Costa, MD      . hydrALAZINE (APRESOLINE) injection 5 mg  5 mg Intravenous Q2H PRN Ivor Costa, MD      . insulin aspart (novoLOG) injection 0-9 Units  0-9 Units Subcutaneous TID WC Ivor Costa, MD      . insulin glargine (LANTUS) injection 20 Units  20 Units Subcutaneous Q2200 Ivor Costa, MD      . metoprolol tartrate (LOPRESSOR) tablet 50 mg  50 mg Oral Daily Ivor Costa, MD      . multivitamin with minerals tablet 1 tablet  1 tablet Oral Daily Ivor Costa, MD      . ondansetron [facility]) injection 4 mg  4 mg Intravenous Q8H PRN Ivor Costa, MD      . pantoprazole (PROTONIX) injection 40 mg  40 mg Intravenous Claudie Leach, MD      . raltegravir (ISENTRESS) tablet 400 mg  400 mg Oral BID Florencia Reasons, MD      . ritonavir  (NORVIR) capsule 100 mg  100 mg Oral Q breakfast Ivor Costa, MD      . sevelamer carbonate (RENVELA) tablet 800 mg  800 mg Oral TID WC Ivor Costa, MD      . zolpidem (AMBIEN) tablet 5 mg  5 mg Oral QHS PRN Ivor Costa, MD        Allergies as of 07/25/2017  . (No Known Allergies)    Family History  Problem Relation Age of Onset  . Heart disease Mother   . Diabetes Mother   .  Heart disease Father   . Cancer Neg Hx     Social History   Social History  . Marital status: Married    Spouse name: kim  . Number of children: 3  . Years of education: college   Occupational History  . disabled    Social History Main Topics  . Smoking status: Never Smoker  . Smokeless tobacco: Never Used  . Alcohol use No  . Drug use: No  . Sexual activity: Not on file   Other Topics Concern  . Not on file   Social History Narrative  . No narrative on file    Review of Systems:  See history of present illness. At this time, he is hungry.  Physical Exam: Vital signs in last 24 hours: Temp:  [98.2 F (36.8 C)-98.8 F (37.1 C)] 98.7 F (37.1 C) (07/28 1420) Pulse Rate:  [48-103] 90 (07/28 1630) Resp:  [11-25] 25 (07/28 1630) BP: (90-145)/(54-93) 117/60 (07/28 1630) SpO2:  [92 %-100 %] 99 % (07/28 1630) Weight:  [122.6 kg (270 lb 4.5 oz)-129.3 kg (285 lb)] 122.6 kg (270 lb 4.5 oz) (07/28 1420) Last BM Date: 07/26/17 General:   Alert,  Well-developed, well-nourished, pleasant and cooperative in NAD, currently on dialysis Head:  Normocephalic and atraumatic. Eyes:  Sclera clear, no icterus.    Lungs:  Clear anteriorly to auscultation.  Heart:   Regular rate and rhythm; no murmurs, clicks, rubs,  or gallops. No obvious A. fib at this time. Abdomen: No guarding, mass effect, or tenderness Neurologic:  Alert and coherent;  grossly normal neurologically. Skin:  Intact without significant lesions or rashes. Psych:   Alert and cooperative. Normal mood and affect.  Intake/Output from previous  day: 07/27 0701 - 07/28 0700 In: 814 [Blood:664; IV Piggyback:150] Out: -  Intake/Output this shift: Total I/O In: 1247.9 [P.O.:1000; I.V.:247.9] Out: -   Lab Results:  Recent Labs  07/25/17 2201 07/26/17 0605 07/26/17 1249  WBC 7.2 6.9 6.9  HGB 7.9* 8.3* 8.4*  HCT 24.2* 24.9* 25.4*  PLT 202 173 164   BMET  Recent Labs  07/25/17 2201 07/26/17 0605  NA 136 137  K 4.0 4.0  CL 95* 99*  CO2 26 26  GLUCOSE 203* 158*  BUN 41* 45*  CREATININE 8.20* 8.80*  CALCIUM 8.7* 8.2*   LFT  Recent Labs  07/25/17 2201  PROT 6.7  ALBUMIN 3.4*  AST 19  ALT 16*  ALKPHOS 103  BILITOT 0.5   PT/INR  Recent Labs  07/25/17 2227  LABPROT 24.2*  INR 2.13    Studies/Results: Ct Abdomen Pelvis Wo Contrast  Result Date: 07/26/2017 CLINICAL DATA:  Gastrointestinal hemorrhage. EXAM: CT ABDOMEN AND PELVIS WITHOUT CONTRAST TECHNIQUE: Multidetector CT imaging of the abdomen and pelvis was performed following the standard protocol without IV contrast. COMPARISON:  None. FINDINGS: Lower chest: No acute abnormality. Hepatobiliary: No focal liver abnormality is seen. No gallstones, gallbladder wall thickening, or biliary dilatation. Pancreas: Unremarkable. No pancreatic ductal dilatation or surrounding inflammatory changes. Spleen: Normal in size without focal abnormality. Adrenals/Urinary Tract: Adrenal glands appear normal. Mild bilateral renal atrophy is noted consistent with history of end-stage renal disease. Multiple low densities are noted throughout both kidneys most consistent with cysts, but several appear to be complex in appearance, and further evaluation with ultrasound is recommended. No hydronephrosis or renal obstruction is noted. No renal or ureteral calculi are noted. Urinary bladder is decompressed. Stomach/Bowel: Stomach is within normal limits. Appendix appears normal. No evidence of bowel wall  thickening, distention, or inflammatory changes. Vascular/Lymphatic: Aortic  atherosclerosis. No enlarged abdominal or pelvic lymph nodes. Reproductive: Prostate is unremarkable. Other: Small fat containing bilateral inguinal hernias are noted. No abnormal fluid collection is noted. Musculoskeletal: No acute or significant osseous findings. IMPRESSION: Aortic atherosclerosis. Mild bilateral renal atrophy is noted consistent with history of end-stage renal disease. Multiple low densities are noted throughout both kidneys most consistent with cysts, but several appear to be complex in appearance, and further evaluation with ultrasound is recommended to rule out mass. Small bilateral fat containing inguinal hernias. No other significant abnormality seen in the abdomen or pelvis. Electronically Signed   By: Marijo Conception, M.D.   On: 07/26/2017 14:19    Impression: Clinically destabilizing GI bleed, characterized by recurrent hematochezia (non-melenic) and dizziness.  Plan: Begin with an unprepped flexible sigmoidoscopy tomorrow. Further management to deep depend on those findings. If it appears that blood was coming from above, the patient might benefit from endoscopic evaluation and colonoscopy during this hospitalization. In the meantime, agree with empiric PPI therapy.   LOS: 0 days   Joshuajames Moehring V  07/26/2017, 4:43 PM   Pager [phone] If no answer or after 5 PM call [phone]

## 2017-07-26 NOTE — ED Notes (Signed)
2ND Unit PRBC infusing at 300 ml/hr with no adverse effect , IV sites intact , denies SOB /no ain , VSS, Protonix drip infusing . Pt. waiting for stepdown unit bed assignment .

## 2017-07-26 NOTE — ED Notes (Addendum)
1st unit PRBC infusing at 120 ml/hr , IV site intact , denies pain/respirations unlabored . Dr. Thomasene Lot explained admission plan to pt. and family at bedside .

## 2017-07-26 NOTE — ED Notes (Signed)
2ND unit PRBC infusing at 120 ml/hr. ,pt. resting with no distress, IV sites intact , Protonix drip infusing , no pain or discomfort /resirations unlabored.

## 2017-07-26 NOTE — Progress Notes (Signed)
519ml of 58ml Isovue 300 solution given to patient for CT, will continue to monitor.

## 2017-07-26 NOTE — ED Notes (Signed)
2nd Unit PRBC completed with no transfusion reaction , denies pain or discomfort , respirations unlabored , IV sites intact , Protonix IV drip infusing . Pt. assisted to transfer on a hospital bed for comfort.

## 2017-07-26 NOTE — Progress Notes (Addendum)
PROGRESS NOTE  Thomas [name] WJX:[id number] DOB: September 21, 1966 DOA: 07/25/2017 PCP: Patient, No Pcp Per  HPI/Recap of past 24 hours:  He wants to eat, report he has not eat anything since 11 am yesterday,  He denies pain, no n/v, no fever, blood pressure stable,  In sinus rhythm, last bloody bm at 2am  Assessment/Plan: Principal Problem:   GIB (gastrointestinal bleeding) Active Problems:   ESRD on dialysis ([place])   Type 2 diabetes mellitus with complication ([place])   HIV disease ([place])   Anemia due to end stage renal disease ([place])   PAF (paroxysmal atrial fibrillation) ([facility])   Symptomatic anemia  GI bleed;  blood per rectum x3 episode started yesterday 3pm, last episode 2am on 7/28, denies pain He was started on ppi drip, changed to ppi bid, suspect lower gi bleed S/p vitk on admission due to on coumadin ,S/p prbc x2units hgb 7.9-8.3-8.4    PAF on coumadin INR 2.13, sinus rhythm here He received vit k on admission  Resume coumadin if ok with gi  ESRD on HD TTS Nephrology consulted   Acute on chronic anemia, possible blood loss anemia on baseline anemia of chronic disease Patient report feeling dizzy after bloody diarrhea S/p prbcx2units  Insulin dependent dm2 a1c 11.7 from 04/2017 Continue insulin, blood sugar around 200  HTN:  presented with low normal bp in the setting of gi bleed Lopressor continued, norvasc and lisinopril held since admission Monitor bp, continue adjust bp meds  HIV CD4 310 in 04/2017 Continue home meds  Code Status: full  Family Communication: patient   Disposition Plan: home in 1-2 days , need gi clearance   Consultants:  Howie Ill  nephrology  Procedures:  prbc transfusion  dialysis  Antibiotics:  none   Objective: BP 124/78   Pulse 89   Temp 98.8 F (37.1 C) (Oral)   Resp 17   Ht 6\' 6"  (1.981 m)   Wt 123.6 kg (272 lb 7.8 oz)   SpO2 98%   BMI 31.49 kg/m   Intake/Output Summary (Last 24 hours) at 07/26/17 0910 Last  data filed at 07/26/17 0501  Gross per 24 hour  Intake              814 ml  Output                0 ml  Net              814 ml   Filed Weights   07/25/17 2158 07/26/17 0638  Weight: 129.3 kg (285 lb) 123.6 kg (272 lb 7.8 oz)    Exam:   General:  NAD  Cardiovascular: RRR  Respiratory: CTABL  Abdomen: Soft/ND/NT, positive BS  Musculoskeletal: No Edema  Neuro:   Data Reviewed: Basic Metabolic Panel:  Recent Labs Lab 07/25/17 2201 07/26/17 0605  NA 136 137  K 4.0 4.0  CL 95* 99*  CO2 26 26  GLUCOSE 203* 158*  BUN 41* 45*  CREATININE 8.20* 8.80*  CALCIUM 8.7* 8.2*   Liver Function Tests:  Recent Labs Lab 07/25/17 2201  AST 19  ALT 16*  ALKPHOS 103  BILITOT 0.5  PROT 6.7  ALBUMIN 3.4*   No results for input(s): LIPASE, AMYLASE in the last 168 hours. No results for input(s): AMMONIA in the last 168 hours. CBC:  Recent Labs Lab 07/25/17 2201 07/26/17 0605  WBC 7.2 6.9  HGB 7.9* 8.3*  HCT 24.2* 24.9*  MCV 93.8 90.2  PLT 202 173  Cardiac Enzymes:   No results for input(s): CKTOTAL, CKMB, CKMBINDEX, TROPONINI in the last 168 hours. BNP (last 3 results)  Recent Labs  05/05/17 1636  BNP 1,980.3*    ProBNP (last 3 results) No results for input(s): PROBNP in the last 8760 hours.  CBG:  Recent Labs Lab 07/26/17 0555  GLUCAP 156*    No results found for this or any previous visit (from the past 240 hour(s)).   Studies: No results found.  Scheduled Meds: . cinacalcet  60 mg Oral Q breakfast  . ferrous sulfate  325 mg Oral TID WC  . fosamprenavir  700 mg Oral BID  . insulin aspart  0-9 Units Subcutaneous TID WC  . insulin glargine  20 Units Subcutaneous Q2200  . metoprolol tartrate  50 mg Oral Daily  . multivitamin with minerals  1 tablet Oral Daily  . [START ON 07/29/2017] pantoprazole  40 mg Intravenous Q12H  . raltegravir  400 mg Oral BID  . ritonavir  100 mg Oral Q breakfast  . sevelamer carbonate  800 mg Oral TID WC     Continuous Infusions: . ferric gluconate (FERRLECIT/NULECIT) IV    . pantoprozole (PROTONIX) infusion 8 mg/hr (07/26/17 0305)     Time spent: >55mins from 9am to 9:35am, including face to face encounter with the patient, update RN at bedside, calling consulted and discuss case with nephrology and GI  Elley Harp MD, PhD  [facility] Pager [phone]. If 7PM-7AM, please contact night-coverage at [email or web address], password [facility] 07/26/2017, 9:10 AM  LOS: 0 days

## 2017-07-26 NOTE — ED Notes (Signed)
Pharmacy notified on pt.'s Vit k order.

## 2017-07-26 NOTE — Consult Note (Signed)
[facility] Renal Consultation Note  Indication for Consultation:  Management of ESRD/hemodialysis; anemia, hypertension/volume and secondary hyperparathyroidism  HPI: Thomas [name] is a 51 y.o. male. 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 He reports yesterday Bright red Blood per rectum x 3 and last episode with Dizziness at the movie theater and came to ER with wife. He is on Coumadin (for A. Fib) ( INR 2.13 on admit). On chronic HD TTS At [facility] center and complaint, for past month   bp meds have been tapered off at op Dialysis ( with episode of lowish bp )seen 7/26 By Ralene Muskrat PA -C  And noted Hydralazine DC . He denies abd. Pain, fevers, chills, sob, Chest pain . Has great appetite .  We are consulted for HD / ESRD needs. Plan for NO Heparin HD today. CT abd pending / K= 4.0  HGB 7.9  Noted op hgb dropping trend 8.8(7/26)<10.8 (7/19)<12.2 (7/12). Transferin  sat 28%  Ferratin 1334 despite pO iron  And Venofer 50 mg weekly hd/ Mircera 75 mcg last given 07/24/17.   Past Medical History:  Diagnosis Date  . Abdominal pain   . Anemia   . Diabetes ([place])   . ESRD (end stage renal disease) ([place])   . ESRD on dialysis ([place]) 03/13/2017  . Fatigue   . HIV disease ([place])   . Hyperparathyroidism, secondary ([place])   . Hypertension   . Hypertensive heart disease with end stage renal disease on dialysis ([place]) 03/13/2017  . Pulmonary congestion   . SOB (shortness of breath)   . SVT (supraventricular tachycardia) ([place])    ? afib or atrial flutter s/p TEE/DCCV with subsequent ablation due to reoccurrence in [place]    Past Surgical History:  Procedure Laterality Date  . AMPUTATION Left    foot  . RIGHT AV FISTULA PLACEMENT  10/19/2012       Family History  Problem Relation Age of Onset  . Heart disease Mother   . Diabetes Mother   . Heart disease Father   . Cancer Neg Hx       reports that he has never smoked. He has never used smokeless tobacco. He reports that he does not drink alcohol or use drugs.  No Known Allergies  Prior to Admission medications   Medication Sig Start Date End Date Taking? Authorizing Provider  amLODipine (NORVASC) 10 MG tablet Take 10 mg by mouth daily.   Yes [provider]  aspirin EC 81 MG tablet Take 81 mg by mouth daily.   Yes [provider]  cinacalcet (SENSIPAR) 60 MG tablet Take 60 mg by mouth daily.   Yes [provider]  doxercalciferol (HECTOROL) 4 MCG/2ML injection Inject 2.5 mLs (5 mcg total) into the vein Every Tuesday,Thursday,and Saturday with dialysis. 05/10/17  Yes Hosie Poisson, MD  ferric gluconate 125 mg in sodium chloride 0.9 % 100 mL Inject 125 mg into the vein Every Tuesday,Thursday,and Saturday with dialysis. 05/10/17  Yes Hosie Poisson, MD  ferrous sulfate 325 (65 FE) MG tablet Take 325 mg by mouth 3 (three) times daily with meals.   Yes [provider]  fosamprenavir (LEXIVA) 700 MG tablet Take 700 mg by mouth 2 (two) times daily.  Yes [provider]  insulin glargine (LANTUS) 100 UNIT/ML injection Inject 0.3 mLs (30 Units total) into the skin daily at 10 pm. 05/08/17  Yes Hosie Poisson, MD  lisinopril (PRINIVIL,ZESTRIL) 40 MG tablet Take 40 mg by mouth daily.   Yes [provider]  metoprolol (LOPRESSOR) 100 MG tablet Take 50 mg by mouth daily.    Yes [provider]  Multiple Vitamin (MULTIVITAMIN) tablet Take 1 tablet by mouth daily.   Yes [provider]  Multiple Vitamin (TAB-A-VITE PO) Take 1 tablet by mouth daily.   Yes [provider]  raltegravir (ISENTRESS) 400 MG tablet Take 400 mg by mouth 2 (two) times daily.   Yes [provider]  ritonavir (NORVIR) 100 MG capsule Take  100 mg by mouth daily with breakfast.   Yes [provider]  sevelamer carbonate (RENVELA) 800 MG tablet Take 800 mg by mouth 3 (three) times daily with meals.   Yes [provider]  warfarin (COUMADIN) 10 MG tablet Take 10 mg by mouth See admin instructions. Take 10 mg by mouth daily Monday through Friday.   Yes [provider]  warfarin (COUMADIN) 7.5 MG tablet Take 7.5 mg by mouth See admin instructions. Take 7.5 mg by mouth daily on Saturday and _0 0 mg Oral 2 times daily 07/26/17 0131        Results for orders placed or performed during the hospital encounter of 07/25/17 (from the past 48 hour(s))  Comprehensive metabolic panel     Status: Abnormal   Collection Time: 07/25/17 10:01 PM  Result Value Ref Range   Sodium 136 135 - 145 mmol/L   Potassium 4.0 3.5 - 5.1 mmol/L   Chloride 95 (L) 101 - 111 mmol/L   CO2 26 22 - 32 mmol/L   Glucose, Bld 203 (H) 65 - 99 mg/dL   BUN 41 (H) 6 - 20 mg/dL   Creatinine, Ser 8.20 (H) 0.61 - 1.24 mg/dL   Calcium 8.7 (L) 8.9 - 10.3 mg/dL   Total Protein 6.7 6.5 - 8.1 g/dL   Albumin 3.4 (L) 3.5 - 5.0 g/dL   AST 19 15 - 41 U/L   ALT 16 (L) 17 - 63 U/L   Alkaline Phosphatase 103 38 - 126 U/L   Total Bilirubin 0.5 0.3 - 1.2 mg/dL   GFR calc non Af Amer 7 (L) >60 mL/min   GFR  calc Af Amer 8 (L) >60 mL/min    Comment: (NOTE) The eGFR has been calculated using the CKD EPI equation. This calculation has not been validated in all clinical situations. eGFR's persistently <60 mL/min signify possible Chronic Kidney  Disease.    Anion gap 15 5 - 15  CBC     Status: Abnormal   Collection Time: 07/25/17 10:01 PM  Result Value Ref Range   WBC 7.2 4.0 - 10.5 K/uL   RBC 2.58 (L) 4.22 - 5.81 MIL/uL   Hemoglobin 7.9 (L) 13.0 - 17.0 g/dL   HCT 24.2 (L) 39.0 - 52.0 %   MCV 93.8 78.0 - 100.0 fL   MCH 30.6 26.0 - 34.0 pg   MCHC 32.6 30.0 - 36.0 g/dL   RDW 17.7 (H) 11.5 - 15.5 %   Platelets 202 150 - 400 K/uL  Type and screen [place]     Status: None (Preliminary result)   Collection Time: 07/25/17 10:04 PM  Result Value Ref Range   ABO/RH(D) B POS    Antibody Screen NEG    Sample Expiration 07/28/2017    Unit Number [id number]    Blood Component Type RED CELLS,LR    Unit division 00    Status of Unit ISSUED    Transfusion Status OK TO TRANSFUSE    Crossmatch Result Compatible    Unit Number [id number]    Blood Component Type RED CELLS,LR    Unit division 00    Status of Unit ISSUED    Transfusion Status OK TO TRANSFUSE    Crossmatch Result Compatible    Unit Number [id number]    Blood Component Type RED CELLS,LR    Unit division 00    Status of Unit ALLOCATED    Transfusion Status OK TO TRANSFUSE    Crossmatch Result Compatible   ABO/Rh     Status: None   Collection Time: 07/25/17 10:04 PM  Result Value Ref Range   ABO/RH(D) B POS   Protime-INR     Status: Abnormal   Collection Time: 07/25/17 10:27 PM  Result Value Ref Range   Prothrombin Time 24.2 (H) 11.4 - 15.2 seconds   INR 2.13   Prepare RBC     Status: None   Collection Time: 07/25/17 11:54 PM  Result Value Ref Range   Order Confirmation ORDER PROCESSED BY BLOOD BANK   Glucose, capillary     Status: Abnormal   Collection Time: 07/26/17  5:55 AM  Result Value Ref  Range   Glucose-Capillary 156 (H) 65 - 99 mg/dL  CBC     Status: Abnormal   Collection Time: 07/26/17  6:05 AM  Result Value Ref Range   WBC 6.9 4.0 - 10.5 K/uL   RBC 2.76 (L) 4.22 - 5.81 MIL/uL   Hemoglobin 8.3 (L) 13.0 - 17.0 g/dL   HCT 24.9 (L) 39.0 - 52.0 %   MCV 90.2 78.0 - 100.0 fL   MCH 30.1 26.0 - 34.0 pg   MCHC 33.3 30.0 - 36.0 g/dL   RDW 18.6 (H) 11.5 - 15.5 %   Platelets 173 150 - 400 K/uL  Basic metabolic panel     Status: Abnormal   Collection Time: 07/26/17  6:05 AM  Result Value Ref Range   Sodium 137 135 - 145 mmol/L   Potassium 4.0 3.5 - 5.1 mmol/L   Chloride 99 (L) 101 - 111 mmol/L   CO2 26 22 - 32 mmol/L   Glucose, Bld 158 (H) 65 - 99 mg/dL   BUN 45 (H) 6 - 20 mg/dL   Creatinine, Ser 8.80 (H) 0.61 - 1.24 mg/dL   Calcium 8.2 (L) 8.9 - 10.3 mg/dL   GFR calc non Af Amer 6 (L) >60  mL/min   GFR calc Af Amer 7 (L) >60 mL/min    Comment: (NOTE) The eGFR has been calculated using the CKD EPI equation. This calculation has not been validated in all clinical situations. eGFR's persistently <60 mL/min signify possible Chronic Kidney Disease.    Anion gap 12 5 - 15     ROS:  See hpi for pos.   Physical Exam: Vitals:   07/26/17 0600 07/26/17 0833  BP:    Pulse: 89   Resp: 17   Temp:  98.8 F (37.1 C)     General: alert obese ,pleasant, talkative AAM  HEENT: [place], MMM, Non icteric  Neck: no jvd Heart: RRR , no mur, rub orr gallop Lungs: CTA  , non labored breathing  Abdomen: Obese, soft , NT, ND Extremities: NO pedal edema / R BKA stump  Skin: no overt rash or bka stump ulcer Neuro: Alert OX4, no acute focal deficits  Dialysis Access: Pos bruit R FA AVF   Dialysis Orders: Center:TueThuSat, 4 hrs 15 min, 180NRe Optiflux, BFR 500, DFR Manual 800 mL/min, EDW 124 (kg), Dialysate 3.0 K, 2.25 Ca, 1.0 Mg, 100 Dextrose (N3231), Sodium 137 (mEq/L), Bicarb Setting: 35 (mEq/L), UFR Profile: None, Sodium Model: None, Access: AV Fistula- Hector ol 7   mcg IV/HD,  Mircera 75 mcg  q 2wks  (last given 07/24/17)   Units IV/HD  Venofer  6m q wk hd    Assessment/Plan 1. GI Bleed (lower ?? Diverticular Bleed) wu per Admit / no heparin HD 2. ESRD -  HD TTS on schedule  3. Hypertension/volume  - Volume ok and  bp stable (is npo  For now) /taper back BP meds as allowed when taking pos ' with recent BP drop on HD  4. Anemia  Of ESRD  And ACUTE Bld Loss/ Fe def/- use venofer on HD x 5 then weekly /can dc po FE with Fe levels monitored monthly at OP HD  5. HO A. FIB - On Coumadin - held per admit  With GI bleed  6. Metabolic bone disease -  IV Vit d on HD / Phos binder when on diet ( Renvela )  7. DM type 2 Per admit  8. HIV on meds per admit   DErnest Haber PA-C [phone]/28/2018, 9:04 AM   Pt seen, examined and agree w A/P as above. ESRD pt with rectal bleeding, on coumadin for afib.  In NSR since ablation in 2017.  Volume and lytes are stable. Plan HD today on schedule.  Will follow. RKelly SplinterMD CNewell Rubbermaidpager [phone]  07/26/2017, 1:29 PM

## 2017-07-26 NOTE — H&P (Addendum)
History and Physical    Thomas [name] KCL:[id number] DOB: Apr 27, 1966 DOA: 07/25/2017  Referring MD/NP/PA:   PCP: Patient, No Pcp Per   Patient coming from:  The patient is coming from home.  At baseline, pt is independent for most of ADL.   Chief Complaint: Rectal bleeding  HPI: Thomas [name] is a 51 y.o. male with medical history significant of atrial fibrillation on Coumadin, hypertension, diabetes mellitus, SVT, ESRD-HD, HIV, anemia due to ESRD, combined systolic and diastolic CHF, left BKA, who presents with rectal bleeding.  Pt state that he has had 3 episodes of rectal bleeding with large amount of bright red blood in stool since 3:00 PM. He states that he has dizziness, but no chest pain, shortness of breath. Denies nausea, vomiting or abdominal pain. No fever or chills. No unilateral weakness. Patient states that he was very dizzy at dialysis yesterday and had one episode of blurry vision, and was found to have persistent hypotension. His blood pressure medications were stopped. He reports that his INR levels were elevated last week. Patient states that he never had EGD or colonoscopy before. Pt is on ASA and coumadin.  ED Course: pt was found to have  hemoglobin dropped from 10.25/10/18-->7.9, INR 2.13, WBC 7.2, potassium 4.0, bicarbonate 26, creatinine 8.2, BUN 41, temperature normal, soft blood pressure, tachycardia, oxygen saturation 96% on room air. Patient is admitted to stepdown as inpatient. 2 units of blood was ordered by EDP.  Review of Systems:   General: no fevers, chills, no changes in body weight, has fatigue HEENT: no blurry vision, hearing changes or sore throat Respiratory: no dyspnea, coughing, wheezing CV: no chest pain, no palpitations GI: no nausea, vomiting, abdominal pain, diarrhea, constipation. Has rectal bleeding. GU: no dysuria, burning on urination, increased urinary frequency, hematuria  Ext: no leg edema. Neuro: no unilateral weakness, numbness, or  tingling, no vision change or hearing loss. Has dizziness Skin: no rash, no skin tear. MSK: No muscle spasm, no deformity, no limitation of range of movement in spin Heme: No easy bruising.  Travel history: No recent long distant travel.  Allergy: No Known Allergies  Past Medical History:  Diagnosis Date  . Abdominal pain   . Anemia   . Diabetes ([place])   . ESRD (end stage renal disease) ([place])   . ESRD on dialysis ([place]) 03/13/2017  . Fatigue   . HIV disease ([place])   . Hyperparathyroidism, secondary ([place])   . Hypertension   . Hypertensive heart disease with end stage renal disease on dialysis ([place]) 03/13/2017  . Pulmonary congestion   . SOB (shortness of breath)   . SVT (supraventricular tachycardia) ([place])    ? afib or atrial flutter s/p TEE/DCCV with subsequent ablation due to reoccurrence in [place]    Past Surgical History:  Procedure Laterality Date  . AMPUTATION Left    foot  . RIGHT AV FISTULA PLACEMENT  10/19/2012    Social History:  reports that he has never smoked. He has never used smokeless tobacco. He reports that he does not drink alcohol or use drugs.  Family History:  Family History  Problem Relation Age of Onset  . Heart disease Mother   . Diabetes Mother   . Heart disease Father   . Cancer Neg Hx      Prior to Admission medications   Medication Sig Start Date End Date Taking? Authorizing Provider  amLODipine (NORVASC) 10 MG tablet Take 10 mg by mouth daily.   Yes [provider]  aspirin EC 81 MG tablet Take 81 mg by mouth daily.   Yes [provider]  cinacalcet (SENSIPAR) 60 MG tablet Take 60 mg by mouth daily.   Yes [provider]  doxercalciferol (HECTOROL) 4 MCG/2ML injection Inject 2.5 mLs (5 mcg total) into the vein Every Tuesday,Thursday,and Saturday with dialysis. 05/10/17  Yes Hosie Poisson, MD  ferric gluconate 125 mg in sodium chloride 0.9 % 100 mL Inject 125 mg into the vein Every Tuesday,Thursday,and Saturday with  dialysis. 05/10/17  Yes Hosie Poisson, MD  ferrous sulfate 325 (65 FE) MG tablet Take 325 mg by mouth 3 (three) times daily with meals.   Yes [provider]  fosamprenavir (LEXIVA) 700 MG tablet Take 700 mg by mouth 2 (two) times daily.    Yes [provider]  insulin glargine (LANTUS) 100 UNIT/ML injection Inject 0.3 mLs (30 Units total) into the skin daily at 10 pm. 05/08/17  Yes Hosie Poisson, MD  lisinopril (PRINIVIL,ZESTRIL) 40 MG tablet Take 40 mg by mouth daily.   Yes [provider]  metoprolol (LOPRESSOR) 100 MG tablet Take 50 mg by mouth daily.    Yes [provider]  Multiple Vitamin (MULTIVITAMIN) tablet Take 1 tablet by mouth daily.   Yes [provider]  Multiple Vitamin (TAB-A-VITE PO) Take 1 tablet by mouth daily.   Yes [provider]  raltegravir (ISENTRESS) 400 MG tablet Take 400 mg by mouth 2 (two) times daily.   Yes [provider]  ritonavir (NORVIR) 100 MG capsule Take 100 mg by mouth daily with breakfast.   Yes [provider]  sevelamer carbonate (RENVELA) 800 MG tablet Take 800 mg by mouth 3 (three) times daily with meals.   Yes [provider]  warfarin (COUMADIN) 10 MG tablet Take 10 mg by mouth See admin instructions. Take 10 mg by mouth daily Monday through Friday.   Yes [provider]  warfarin (COUMADIN) 7.5 MG tablet Take 7.5 mg by mouth See admin instructions. Take 7.5 mg by mouth daily on Saturday and Sunday   Yes [provider]  insulin aspart (NOVOLOG) 100 UNIT/ML injection CBG 70 - 120: 0 units CBG 121 - 150: 3 units CBG 151 - 200: 4 units CBG 201 - 250: 7 units CBG 251 - 300: 11 units CBG 301 - 350: 15 units CBG 351 - 400: 20 units Patient not taking: Reported on 07/26/2017 05/08/17   Hosie Poisson, MD    Physical Exam: Vitals:   07/26/17 0352 07/26/17 0400 07/26/17 0415 07/26/17 0430  BP: 125/76 (!) 111/93 103/61 102/65  Pulse: 92 80 91 90  Resp: 16       Temp: 98.3 F (36.8 C)     TempSrc: Oral     SpO2: 100% 100% 97% 98%  Weight:      Height:       General: Not in acute distress HEENT:       Eyes: PERRL, EOMI, no scleral icterus.       ENT: No discharge from the ears and nose, no pharynx injection, no tonsillar enlargement.        Neck: No JVD, no bruit, no mass felt. Heme: No neck lymph node enlargement. Cardiac: S1/S2, irregularly irregular rhythm, No murmurs, No gallops or rubs. Respiratory: No rales, wheezing, rhonchi or rubs. GI: Soft, nondistended, nontender, no rebound pain, no organomegaly, BS present. GU: No hematuria Ext: No pitting leg edema bilaterally. 2+DP/PT on the right. S/p of L BKA Musculoskeletal: No joint  deformities, No joint redness or warmth, no limitation of ROM in spin. Skin: No rashes.  Neuro: Alert, oriented X3, cranial nerves II-XII grossly intact, moves all extremities normally.  Psych: Patient is not psychotic, no suicidal or hemocidal ideation.  Labs on Admission: I have personally reviewed following labs and imaging studies  CBC:  Recent Labs Lab 07/25/17 2201  WBC 7.2  HGB 7.9*  HCT 24.2*  MCV 93.8  PLT 998   Basic Metabolic Panel:  Recent Labs Lab 07/25/17 2201  NA 136  K 4.0  CL 95*  CO2 26  GLUCOSE 203*  BUN 41*  CREATININE 8.20*  CALCIUM 8.7*   GFR: Estimated Creatinine Clearance: 16.1 mL/min (A) (by C-G formula based on SCr of 8.2 mg/dL (H)). Liver Function Tests:  Recent Labs Lab 07/25/17 2201  AST 19  ALT 16*  ALKPHOS 103  BILITOT 0.5  PROT 6.7  ALBUMIN 3.4*   No results for input(s): LIPASE, AMYLASE in the last 168 hours. No results for input(s): AMMONIA in the last 168 hours. Coagulation Profile:  Recent Labs Lab 07/25/17 2227  INR 2.13   Cardiac Enzymes: No results for input(s): CKTOTAL, CKMB, CKMBINDEX, TROPONINI in the last 168 hours. BNP (last 3 results) No results for input(s): PROBNP in the last 8760 hours. HbA1C: No results for input(s):  HGBA1C in the last 72 hours. CBG: No results for input(s): GLUCAP in the last 168 hours. Lipid Profile: No results for input(s): CHOL, HDL, LDLCALC, TRIG, CHOLHDL, LDLDIRECT in the last 72 hours. Thyroid Function Tests: No results for input(s): TSH, T4TOTAL, FREET4, T3FREE, THYROIDAB in the last 72 hours. Anemia Panel: No results for input(s): VITAMINB12, FOLATE, FERRITIN, TIBC, IRON, RETICCTPCT in the last 72 hours. Urine analysis: No results found for: COLORURINE, APPEARANCEUR, LABSPEC, PHURINE, GLUCOSEU, HGBUR, BILIRUBINUR, KETONESUR, PROTEINUR, UROBILINOGEN, NITRITE, LEUKOCYTESUR Sepsis Labs: @LABRCNTIP (procalcitonin:4,lacticidven:4) )No results found for this or any previous visit (from the past 240 hour(s)).   Radiological Exams on Admission: No results found.   EKG: Independently reviewed. A. fib, QTC 472, tachycardia, poor R-wave progression, first degree AV block, nonspecific T-wave change.     Assessment/Plan Principal Problem:   GIB (gastrointestinal bleeding) Active Problems:   ESRD on dialysis ([place])   Type 2 diabetes mellitus with complication (HCC)   HIV disease ([place])   Anemia due to end stage renal disease (HCC)   PAF (paroxysmal atrial fibrillation) (HCC)   Symptomatic anemia  GIB (gastrointestinal bleeding): Patient has a painless GI bleeding, likely due to diverticulosis bleeding. Patient has never had EGD or colonoscopy before. He is on Coumadin for atrial fibrillation. Patient is asymptomatic, blood pressure is soft. Hemoglobin dropped from 10.2-->7.9. Currently hemodynamically stable.   -will admit to SDU as inpt - NPO - 2 U of blood ordered by EDP. - will reverse INR by giving 5 mg of Vk by IV now  - hold coumadin and ASA - Start IV pantoprazole gtt - Zofran IV for nausea - Avoid NSAIDs and SQ heparin - Maintain IV access (2 large bore IVs if possible). - Monitor closely and follow q6h cbc, transfuse as necessary. - please call GI in AM   PAF  (paroxysmal atrial fibrillation): CHA2DS2-VASc Score is 3, needs oral anticoagulation. Patient is on Coumadin at home, but now has GIB. INR is 2.13 on admission. Heart rate is controlled. -hold coumadin due to GI. -continue metoprolol  DM-II: Last A1c 11.7 on 05/05/17, poorly controled. Patient is taking Lantus and NovoLog at home -will decrease Lantus dose from  30 to 20 units daily  -SSI  Anemia due to end stage renal disease and symptomatic anemia due to GIB: -transfuse blood as above -continue iron supplement  HIV: CD4 6:30 on 05/05/17, viral load 30 on 03/10/17 -Continue home HIV medications.  ESRD-HD (TTS):  potassium 4.0, bicarbonate 26, creatinine 8.2, BUN 41 -left messages to renal box for dialysis -Continue Sensipar, Hectorol  DVT ppx: SCD Code Status: Full code Family Communication: Yes, patient's  wife at bed side Disposition Plan:  Anticipate discharge back to previous home environment Consults called:  none Admission status:  SDU/inpation       Date of Service 07/26/2017    Ivor Costa [facility] Pager [phone]  If 7PM-7AM, please contact night-coverage [email or web address] Password [facility] 07/26/2017, 4:51 AM

## 2017-07-26 NOTE — ED Notes (Signed)
1st unit blood infusing at 250 ml/hr. , no signs of transfusion reaction , respirations unlabored , denies pain , IV site intact.

## 2017-07-26 NOTE — ED Notes (Signed)
1ST unit PRBC comleted with no adverse effect , IV site intact , no pain or discomfort , VSS/IV site intact .

## 2017-07-27 ENCOUNTER — Encounter (HOSPITAL_COMMUNITY): Payer: Self-pay

## 2017-07-27 ENCOUNTER — Encounter (HOSPITAL_COMMUNITY)
Admission: EM | Disposition: A | Discharge status: Home / Self Care | Payer: Self-pay | Source: Home / Self Care | Attending: Internal Medicine

## 2017-07-27 DIAGNOSIS — Z794 Long term (current) use of insulin: Secondary | ICD-10-CM | POA: Diagnosis not present

## 2017-07-27 DIAGNOSIS — D649 Anemia, unspecified: Secondary | ICD-10-CM | POA: Diagnosis not present

## 2017-07-27 DIAGNOSIS — Z992 Dependence on renal dialysis: Secondary | ICD-10-CM | POA: Diagnosis not present

## 2017-07-27 DIAGNOSIS — D631 Anemia in chronic kidney disease: Secondary | ICD-10-CM | POA: Diagnosis not present

## 2017-07-27 DIAGNOSIS — B20 Human immunodeficiency virus [HIV] disease: Secondary | ICD-10-CM | POA: Diagnosis not present

## 2017-07-27 DIAGNOSIS — K922 Gastrointestinal hemorrhage, unspecified: Secondary | ICD-10-CM | POA: Diagnosis not present

## 2017-07-27 DIAGNOSIS — K226 Gastro-esophageal laceration-hemorrhage syndrome: Secondary | ICD-10-CM | POA: Diagnosis not present

## 2017-07-27 DIAGNOSIS — N186 End stage renal disease: Secondary | ICD-10-CM | POA: Diagnosis not present

## 2017-07-27 DIAGNOSIS — I480 Paroxysmal atrial fibrillation: Secondary | ICD-10-CM | POA: Diagnosis not present

## 2017-07-27 HISTORY — PX: FLEXIBLE SIGMOIDOSCOPY: SHX5431

## 2017-07-27 LAB — CBC
HCT: 24.7 % — ABNORMAL LOW (ref 39.0–52.0)
HCT: 26.5 % — ABNORMAL LOW (ref 39.0–52.0)
HEMOGLOBIN: 8.3 g/dL — AB (ref 13.0–17.0)
Hemoglobin: 8.6 g/dL — ABNORMAL LOW (ref 13.0–17.0)
MCH: 30.3 pg (ref 26.0–34.0)
MCH: 29.7 pg (ref 26.0–34.0)
MCHC: 33.6 g/dL (ref 30.0–36.0)
MCHC: 32.5 g/dL (ref 30.0–36.0)
MCV: 90.1 fL (ref 78.0–100.0)
MCV: 91.4 fL (ref 78.0–100.0)
PLATELETS: 176 10*3/uL (ref 150–400)
PLATELETS: 211 10*3/uL (ref 150–400)
RBC: 2.74 MIL/uL — AB (ref 4.22–5.81)
RBC: 2.9 MIL/uL — ABNORMAL LOW (ref 4.22–5.81)
RDW: 19.1 % — ABNORMAL HIGH (ref 11.5–15.5)
RDW: 18.8 % — AB (ref 11.5–15.5)
WBC: 6.1 10*3/uL (ref 4.0–10.5)
WBC: 6.2 10*3/uL (ref 4.0–10.5)

## 2017-07-27 LAB — GLUCOSE, CAPILLARY
Glucose-Capillary: 194 mg/dL — ABNORMAL HIGH (ref 65–99)
Glucose-Capillary: 201 mg/dL — ABNORMAL HIGH (ref 65–99)
Glucose-Capillary: 148 mg/dL — ABNORMAL HIGH (ref 65–99)
Glucose-Capillary: 145 mg/dL — ABNORMAL HIGH (ref 65–99)

## 2017-07-27 SURGERY — SIGMOIDOSCOPY, FLEXIBLE
Anesthesia: Moderate Sedation

## 2017-07-27 MED ORDER — SODIUM CHLORIDE 0.9 % IV SOLN
INTRAVENOUS | Status: DC
  Administered 2017-07-27: 11:00:00 via INTRAVENOUS

## 2017-07-27 MED ORDER — MIDAZOLAM HCL 10 MG/2ML IJ SOLN
INTRAMUSCULAR | Status: DC | PRN
  Administered 2017-07-27: 2 mg via INTRAVENOUS

## 2017-07-27 MED ORDER — FENTANYL CITRATE (PF) 100 MCG/2ML IJ SOLN
INTRAMUSCULAR | Status: AC
  Filled 2017-07-27: qty 2

## 2017-07-27 MED ORDER — AMLODIPINE BESYLATE 5 MG PO TABS
5.0000 mg | ORAL_TABLET | Freq: Every day | ORAL | Status: DC
  Administered 2017-07-27 – 2017-07-30 (×4): 5 mg via ORAL
  Filled 2017-07-27 (×4): qty 1

## 2017-07-27 MED ORDER — FENTANYL CITRATE (PF) 100 MCG/2ML IJ SOLN
INTRAMUSCULAR | Status: DC | PRN
  Administered 2017-07-27: 25 ug via INTRAVENOUS

## 2017-07-27 MED ORDER — MIDAZOLAM HCL 5 MG/ML IJ SOLN
INTRAMUSCULAR | Status: AC
  Filled 2017-07-27: qty 2

## 2017-07-27 NOTE — H&P (View-Only) (Signed)
Referring Provider:  Dr. Dillard Cannon ([facility]) Primary Care Physician:  Patient, No Pcp Per Primary Gastroenterologist:  None (unassigned)  Reason for Consultation:  Rectal bleeding  HPI: Thomas [name] is a 51 y.o. male on dialysis for and stage renal disease, also history of chronic anticoagulation for atrial fibrillation, admitted to the hospital today because of rectal bleeding which occurred yesterday around 3 PM and consisted of 3 large-volume bloody bowel movements. Since then, he has had no further bleeding. There was associated dizziness. The patient was transfused 2 units of packed cells while on dialysis today and then sent here. He has received vitamin K. Hemoglobin today is 8.3, after transfusion of 2 units of packed cells; hemoglobin yesterday was 7.9 prior to transfusion. Baseline hemoglobin runs around 9 or 10.  The patient has never had colonoscopy. He has never had previous GI bleeding. He does take a daily aspirin as well as his Coumadin. He has not had any prodromal dyspeptic symptomatology. An abdominal CT today showed no evidence of colitis or diverticulosis. Note that the patient's BUN did not rise in association with this bleeding episode.  The patient has long-standing diabetes but no known cardiopulmonary disease.   Past Medical History:  Diagnosis Date  . Abdominal pain   . Anemia   . Diabetes ([place])   . ESRD (end stage renal disease) ([place])   . ESRD on dialysis ([place]) 03/13/2017  . Fatigue   . HIV disease ([place])   . Hyperparathyroidism, secondary ([place])   . Hypertension   . Hypertensive heart disease with end stage renal disease on dialysis ([place]) 03/13/2017  . Pulmonary congestion   . SOB (shortness of breath)   . SVT (supraventricular tachycardia) ([place])    ? afib or atrial flutter s/p TEE/DCCV with subsequent ablation due to reoccurrence in [place]    Past Surgical History:  Procedure Laterality Date  . AMPUTATION Left    foot  . RIGHT AV FISTULA PLACEMENT   10/19/2012    Prior to Admission medications   Medication Sig Start Date End Date Taking? Authorizing Provider  amLODipine (NORVASC) 10 MG tablet Take 10 mg by mouth daily.   Yes [provider]  aspirin EC 81 MG tablet Take 81 mg by mouth daily.   Yes [provider]  cinacalcet (SENSIPAR) 60 MG tablet Take 60 mg by mouth daily.   Yes [provider]  doxercalciferol (HECTOROL) 4 MCG/2ML injection Inject 2.5 mLs (5 mcg total) into the vein Every Tuesday,Thursday,and Saturday with dialysis. 05/10/17  Yes Hosie Poisson, MD  ferric gluconate 125 mg in sodium chloride 0.9 % 100 mL Inject 125 mg into the vein Every Tuesday,Thursday,and Saturday with dialysis. 05/10/17  Yes Hosie Poisson, MD  ferrous sulfate 325 (65 FE) MG tablet Take 325 mg by mouth 3 (three) times daily with meals.   Yes [provider]  fosamprenavir (LEXIVA) 700 MG tablet Take 700 mg by mouth 2 (two) times daily.    Yes [provider]  insulin glargine (LANTUS) 100 UNIT/ML injection Inject 0.3 mLs (30 Units total) into the skin daily at 10 pm. 05/08/17  Yes Hosie Poisson, MD  lisinopril (PRINIVIL,ZESTRIL) 40 MG tablet Take 40 mg by mouth daily.   Yes [provider]  metoprolol (LOPRESSOR) 100 MG tablet Take 50 mg by mouth daily.    Yes [provider]  Multiple Vitamin (MULTIVITAMIN) tablet Take 1 tablet by mouth daily.   Yes [provider]  Multiple Vitamin (TAB-A-VITE PO) Take 1  tablet by mouth daily.   Yes [provider]  raltegravir (ISENTRESS) 400 MG tablet Take 400 mg by mouth 2 (two) times daily.   Yes [provider]  ritonavir (NORVIR) 100 MG capsule Take 100 mg by mouth daily with breakfast.   Yes [provider]  sevelamer carbonate (RENVELA) 800 MG tablet Take 800 mg by mouth 3 (three) times daily with meals.   Yes [provider]  warfarin (COUMADIN) 10 MG tablet Take 10 mg by mouth See admin instructions. Take  10 mg by mouth daily Monday through Friday.   Yes [provider]  warfarin (COUMADIN) 7.5 MG tablet Take 7.5 mg by mouth See admin instructions. Take 7.5 mg by mouth daily on Saturday and Sunday   Yes [provider]  insulin aspart (NOVOLOG) 100 UNIT/ML injection CBG 70 - 120: 0 units CBG 121 - 150: 3 units CBG 151 - 200: 4 units CBG 201 - 250: 7 units CBG 251 - 300: 11 units CBG 301 - 350: 15 units CBG 351 - 400: 20 units Patient not taking: Reported on 07/26/2017 05/08/17   Hosie Poisson, MD    Current Facility-Administered Medications  Medication Dose Route Frequency Provider Last Rate Last Dose  . acetaminophen (TYLENOL) tablet 650 mg  650 mg Oral Q6H PRN Ivor Costa, MD       Or  . acetaminophen (TYLENOL) suppository 650 mg  650 mg Rectal Q6H PRN Ivor Costa, MD      . cinacalcet (SENSIPAR) tablet 60 mg  60 mg Oral Q breakfast Ivor Costa, MD      . doxercalciferol (HECTOROL) injection 7 mcg  7 mcg Intravenous Q T,Th,Sa-HD Ernest Haber, PA-C      . [START ON 07/29/2017] ferric gluconate (NULECIT) 125 mg in sodium chloride 0.9 % 100 mL IVPB  125 mg Intravenous Q T,Th,Sa-HD Zeyfang, David, PA-C      . fosamprenavir (LEXIVA) tablet 700 mg  700 mg Oral BID Ivor Costa, MD      . hydrALAZINE (APRESOLINE) injection 5 mg  5 mg Intravenous Q2H PRN Ivor Costa, MD      . insulin aspart (novoLOG) injection 0-9 Units  0-9 Units Subcutaneous TID WC Ivor Costa, MD      . insulin glargine (LANTUS) injection 20 Units  20 Units Subcutaneous Q2200 Ivor Costa, MD      . metoprolol tartrate (LOPRESSOR) tablet 50 mg  50 mg Oral Daily Ivor Costa, MD      . multivitamin with minerals tablet 1 tablet  1 tablet Oral Daily Ivor Costa, MD      . ondansetron [facility]) injection 4 mg  4 mg Intravenous Q8H PRN Ivor Costa, MD      . pantoprazole (PROTONIX) injection 40 mg  40 mg Intravenous Claudie Leach, MD      . raltegravir (ISENTRESS) tablet 400 mg  400 mg Oral BID Florencia Reasons, MD      . ritonavir  (NORVIR) capsule 100 mg  100 mg Oral Q breakfast Ivor Costa, MD      . sevelamer carbonate (RENVELA) tablet 800 mg  800 mg Oral TID WC Ivor Costa, MD      . zolpidem (AMBIEN) tablet 5 mg  5 mg Oral QHS PRN Ivor Costa, MD        Allergies as of 07/25/2017  . (No Known Allergies)    Family History  Problem Relation Age of Onset  . Heart disease Mother   . Diabetes Mother   .  Heart disease Father   . Cancer Neg Hx     Social History   Social History  . Marital status: Married    Spouse name: kim  . Number of children: 3  . Years of education: college   Occupational History  . disabled    Social History Main Topics  . Smoking status: Never Smoker  . Smokeless tobacco: Never Used  . Alcohol use No  . Drug use: No  . Sexual activity: Not on file   Other Topics Concern  . Not on file   Social History Narrative  . No narrative on file    Review of Systems:  See history of present illness. At this time, he is hungry.  Physical Exam: Vital signs in last 24 hours: Temp:  [98.2 F (36.8 C)-98.8 F (37.1 C)] 98.7 F (37.1 C) (07/28 1420) Pulse Rate:  [48-103] 90 (07/28 1630) Resp:  [11-25] 25 (07/28 1630) BP: (90-145)/(54-93) 117/60 (07/28 1630) SpO2:  [92 %-100 %] 99 % (07/28 1630) Weight:  [122.6 kg (270 lb 4.5 oz)-129.3 kg (285 lb)] 122.6 kg (270 lb 4.5 oz) (07/28 1420) Last BM Date: 07/26/17 General:   Alert,  Well-developed, well-nourished, pleasant and cooperative in NAD, currently on dialysis Head:  Normocephalic and atraumatic. Eyes:  Sclera clear, no icterus.    Lungs:  Clear anteriorly to auscultation.  Heart:   Regular rate and rhythm; no murmurs, clicks, rubs,  or gallops. No obvious A. fib at this time. Abdomen: No guarding, mass effect, or tenderness Neurologic:  Alert and coherent;  grossly normal neurologically. Skin:  Intact without significant lesions or rashes. Psych:   Alert and cooperative. Normal mood and affect.  Intake/Output from previous  day: 07/27 0701 - 07/28 0700 In: 814 [Blood:664; IV Piggyback:150] Out: -  Intake/Output this shift: Total I/O In: 1247.9 [P.O.:1000; I.V.:247.9] Out: -   Lab Results:  Recent Labs  07/25/17 2201 07/26/17 0605 07/26/17 1249  WBC 7.2 6.9 6.9  HGB 7.9* 8.3* 8.4*  HCT 24.2* 24.9* 25.4*  PLT 202 173 164   BMET  Recent Labs  07/25/17 2201 07/26/17 0605  NA 136 137  K 4.0 4.0  CL 95* 99*  CO2 26 26  GLUCOSE 203* 158*  BUN 41* 45*  CREATININE 8.20* 8.80*  CALCIUM 8.7* 8.2*   LFT  Recent Labs  07/25/17 2201  PROT 6.7  ALBUMIN 3.4*  AST 19  ALT 16*  ALKPHOS 103  BILITOT 0.5   PT/INR  Recent Labs  07/25/17 2227  LABPROT 24.2*  INR 2.13    Studies/Results: Ct Abdomen Pelvis Wo Contrast  Result Date: 07/26/2017 CLINICAL DATA:  Gastrointestinal hemorrhage. EXAM: CT ABDOMEN AND PELVIS WITHOUT CONTRAST TECHNIQUE: Multidetector CT imaging of the abdomen and pelvis was performed following the standard protocol without IV contrast. COMPARISON:  None. FINDINGS: Lower chest: No acute abnormality. Hepatobiliary: No focal liver abnormality is seen. No gallstones, gallbladder wall thickening, or biliary dilatation. Pancreas: Unremarkable. No pancreatic ductal dilatation or surrounding inflammatory changes. Spleen: Normal in size without focal abnormality. Adrenals/Urinary Tract: Adrenal glands appear normal. Mild bilateral renal atrophy is noted consistent with history of end-stage renal disease. Multiple low densities are noted throughout both kidneys most consistent with cysts, but several appear to be complex in appearance, and further evaluation with ultrasound is recommended. No hydronephrosis or renal obstruction is noted. No renal or ureteral calculi are noted. Urinary bladder is decompressed. Stomach/Bowel: Stomach is within normal limits. Appendix appears normal. No evidence of bowel wall  thickening, distention, or inflammatory changes. Vascular/Lymphatic: Aortic  atherosclerosis. No enlarged abdominal or pelvic lymph nodes. Reproductive: Prostate is unremarkable. Other: Small fat containing bilateral inguinal hernias are noted. No abnormal fluid collection is noted. Musculoskeletal: No acute or significant osseous findings. IMPRESSION: Aortic atherosclerosis. Mild bilateral renal atrophy is noted consistent with history of end-stage renal disease. Multiple low densities are noted throughout both kidneys most consistent with cysts, but several appear to be complex in appearance, and further evaluation with ultrasound is recommended to rule out mass. Small bilateral fat containing inguinal hernias. No other significant abnormality seen in the abdomen or pelvis. Electronically Signed   By: Marijo Conception, M.D.   On: 07/26/2017 14:19    Impression: Clinically destabilizing GI bleed, characterized by recurrent hematochezia (non-melenic) and dizziness.  Plan: Begin with an unprepped flexible sigmoidoscopy tomorrow. Further management to deep depend on those findings. If it appears that blood was coming from above, the patient might benefit from endoscopic evaluation and colonoscopy during this hospitalization. In the meantime, agree with empiric PPI therapy.   LOS: 0 days   Seon Gaertner V  07/26/2017, 4:43 PM   Pager [phone] If no answer or after 5 PM call [phone]

## 2017-07-27 NOTE — Interval H&P Note (Signed)
History and Physical Interval Note:  07/27/2017 9:09 AM  Thomas [name]  has presented today for surgery, with the diagnosis of Rectal bleeding  The various methods of treatment have been discussed with the patient and family. After consideration of risks, benefits and other options for treatment, the patient has consented to  Procedure(s): FLEXIBLE SIGMOIDOSCOPY (N/A) as a surgical intervention .  The patient's history has been reviewed, patient examined, no change in status, stable for surgery.  I have reviewed the patient's chart and labs.  Questions were answered to the patient's satisfaction.     [name]

## 2017-07-27 NOTE — Progress Notes (Signed)
LATE ENTRY for 07/26/2017  Patient refused scheduled Novlog on this shift,'' saying he has had nothing to eat all day'' patient education completed, will continue to monitor.

## 2017-07-27 NOTE — Progress Notes (Signed)
Patient's flexible sigmoidoscopy was unrevealing. There was no blood up to the limit of the exam, roughly 40 cm. There was really not any melenic stool or blood stained stool present. No source of bleeding was identified, not even significant hemorrhoids.  The patient has not had further bleeding and hemoglobin is stable overnight, status post transfusion of 2 units of packed red cells.  I have recommended endoscopy and colonoscopy tomorrow, and the patient is agreeable after discussion of the purpose risks of the procedure. Specifically, I'm concerned that this patient on aspirin and Coumadin may be at risk for upper tract hemorrhage and/or further bleeding if he goes back home on those medications, without further evaluation. He had bleeding of sufficient severity to make him dizzy and require transfusion of 2 units of cells, and we have not found the source, so I do think we need to look further.  Diet and prep orders are written.  [name], M.D. Pager [phone] If no answer or after 5 PM call [phone]

## 2017-07-27 NOTE — Progress Notes (Signed)
PROGRESS NOTE  Jesiel Garate ERX:[id number] DOB: 01-05-1966 DOA: 07/25/2017 PCP: Patient, No Pcp Per  HPI/Recap of past 24 hours:  Returned from sigmoidscope No more bloody bm, He denies pain, no n/v, no fever, blood pressure stable,  In sinus rhythm,  Assessment/Plan: Principal Problem:   GIB (gastrointestinal bleeding) Active Problems:   ESRD on dialysis ([place])   Type 2 diabetes mellitus with complication ([place])   HIV disease ([place])   Anemia due to end stage renal disease ([place])   PAF (paroxysmal atrial fibrillation) ([facility])   Symptomatic anemia  GI bleed;  -blood per rectum x3 episode started yesterday 3pm, last episode 2am on 7/28, denies pain -He was started on ppi drip, changed to ppi bid, suspect lower gi bleed -S/p vitk on admission due to on coumadin ,S/p prbc x2units -hgb remain above 8 -Unremarkable sigmoidscope, CT ab/pel no mention of diverticula -To have EGD/colonoscopy on 7/30 per GI    PAF on coumadin -INR 2.13, sinus rhythm here -He received vit k on admission  -Resume coumadin when ok with gi  ESRD on HD TTS Nephrology consulted   Acute on chronic anemia, possible blood loss anemia on baseline anemia of chronic disease -Patient report feeling dizzy after bloody diarrhea -S/p prbcx2units  Insulin dependent dm2 a1c 11.7 from 04/2017 Continue insulin, am blood sugar around 158  HTN:  presented with low normal bp in the setting of gi bleed Lopressor continued, norvasc and lisinopril held since admission, resume low dose norvasc on 7/29 Monitor bp, continue adjust bp meds  HIV CD4 310 in 04/2017 Continue home meds  Code Status: full  Family Communication: patient   Disposition Plan: home in 1-2 days , need gi clearance   Consultants:  Howie Ill  nephrology  Procedures:  prbc transfusion  Dialysis  Sigmoidoscope on 7/29  Egd/colonscopy planned on 7/30  Antibiotics:  none   Objective: BP 133/74   Pulse 92   Temp 98.4 F (36.9 C)  (Oral)   Resp 18   Ht 6\' 6"  (1.981 m)   Wt 121.8 kg (268 lb 8.3 oz)   SpO2 94%   BMI 31.03 kg/m   Intake/Output Summary (Last 24 hours) at 07/27/17 1132 Last data filed at 07/27/17 1038  Gross per 24 hour  Intake          1237.92 ml  Output              886 ml  Net           351.92 ml   Filed Weights   07/26/17 1420 07/26/17 1742 07/27/17 0828  Weight: 122.6 kg (270 lb 4.5 oz) 121.8 kg (268 lb 8.3 oz) 121.8 kg (268 lb 8.3 oz)    Exam:   General:  NAD  Cardiovascular: RRR  Respiratory: CTABL  Abdomen: Soft/ND/NT, positive BS  Musculoskeletal: No Edema  Neuro: aaox3  Data Reviewed: Basic Metabolic Panel:  Recent Labs Lab 07/25/17 2201 07/26/17 0605  NA 136 137  K 4.0 4.0  CL 95* 99*  CO2 26 26  GLUCOSE 203* 158*  BUN 41* 45*  CREATININE 8.20* 8.80*  CALCIUM 8.7* 8.2*   Liver Function Tests:  Recent Labs Lab 07/25/17 2201  AST 19  ALT 16*  ALKPHOS 103  BILITOT 0.5  PROT 6.7  ALBUMIN 3.4*   No results for input(s): LIPASE, AMYLASE in the last 168 hours. No results for input(s): AMMONIA in the last 168 hours. CBC:  Recent Labs Lab 07/25/17 2201 07/26/17 0605 07/26/17  1249 07/26/17 2050 07/27/17 0154  WBC 7.2 6.9 6.9 7.1 6.1  HGB 7.9* 8.3* 8.4* 9.1* 8.3*  HCT 24.2* 24.9* 25.4* 27.5* 24.7*  MCV 93.8 90.2 90.1 89.9 90.1  PLT 202 173 164 197 176   Cardiac Enzymes:   No results for input(s): CKTOTAL, CKMB, CKMBINDEX, TROPONINI in the last 168 hours. BNP (last 3 results)  Recent Labs  05/05/17 1636  BNP 1,980.3*    ProBNP (last 3 results) No results for input(s): PROBNP in the last 8760 hours.  CBG:  Recent Labs Lab 07/26/17 0555 07/26/17 2029 07/26/17 2329 07/27/17 0707  GLUCAP 156* 235* 298* 194*    Recent Results (from the past 240 hour(s))  MRSA PCR Screening     Status: None   Collection Time: 07/26/17  6:42 AM  Result Value Ref Range Status   MRSA by PCR NEGATIVE NEGATIVE Final    Comment:        The GeneXpert  MRSA Assay (FDA approved for NASAL specimens only), is one component of a comprehensive MRSA colonization surveillance program. It is not intended to diagnose MRSA infection nor to guide or monitor treatment for MRSA infections.      Studies: Ct Abdomen Pelvis Wo Contrast  Result Date: 07/26/2017 CLINICAL DATA:  Gastrointestinal hemorrhage. EXAM: CT ABDOMEN AND PELVIS WITHOUT CONTRAST TECHNIQUE: Multidetector CT imaging of the abdomen and pelvis was performed following the standard protocol without IV contrast. COMPARISON:  None. FINDINGS: Lower chest: No acute abnormality. Hepatobiliary: No focal liver abnormality is seen. No gallstones, gallbladder wall thickening, or biliary dilatation. Pancreas: Unremarkable. No pancreatic ductal dilatation or surrounding inflammatory changes. Spleen: Normal in size without focal abnormality. Adrenals/Urinary Tract: Adrenal glands appear normal. Mild bilateral renal atrophy is noted consistent with history of end-stage renal disease. Multiple low densities are noted throughout both kidneys most consistent with cysts, but several appear to be complex in appearance, and further evaluation with ultrasound is recommended. No hydronephrosis or renal obstruction is noted. No renal or ureteral calculi are noted. Urinary bladder is decompressed. Stomach/Bowel: Stomach is within normal limits. Appendix appears normal. No evidence of bowel wall thickening, distention, or inflammatory changes. Vascular/Lymphatic: Aortic atherosclerosis. No enlarged abdominal or pelvic lymph nodes. Reproductive: Prostate is unremarkable. Other: Small fat containing bilateral inguinal hernias are noted. No abnormal fluid collection is noted. Musculoskeletal: No acute or significant osseous findings. IMPRESSION: Aortic atherosclerosis. Mild bilateral renal atrophy is noted consistent with history of end-stage renal disease. Multiple low densities are noted throughout both kidneys most  consistent with cysts, but several appear to be complex in appearance, and further evaluation with ultrasound is recommended to rule out mass. Small bilateral fat containing inguinal hernias. No other significant abnormality seen in the abdomen or pelvis. Electronically Signed   By: Marijo Conception, M.D.   On: 07/26/2017 14:19    Scheduled Meds: . cinacalcet  60 mg Oral Q breakfast  . doxercalciferol  7 mcg Intravenous Q T,Th,Sa-HD  . fosamprenavir  700 mg Oral BID  . insulin aspart  0-9 Units Subcutaneous TID WC  . insulin glargine  20 Units Subcutaneous Q2200  . metoprolol tartrate  50 mg Oral Daily  . multivitamin with minerals  1 tablet Oral Daily  . pantoprazole  40 mg Intravenous Q12H  . raltegravir  400 mg Oral BID  . ritonavir  100 mg Oral Q breakfast  . sevelamer carbonate  800 mg Oral TID WC    Continuous Infusions: . sodium chloride 20 mL/hr at 07/27/17 1038  . [  START ON 07/29/2017] ferric gluconate (FERRLECIT/NULECIT) IV       Time spent: >30mins  Bettie Swavely MD, PhD  [facility] Pager [phone]. If 7PM-7AM, please contact night-coverage at [email or web address], password [email or web address] 07/27/2017, 11:32 AM  LOS: 1 day

## 2017-07-27 NOTE — Progress Notes (Addendum)
[facility] Progress Note  Subjective: doing well, no probs w HD yest.  Had flex sig study today, "didn't see anything" per pt, may have upper endo next.    Vitals:   07/27/17 0920 07/27/17 0925 07/27/17 0930 07/27/17 0935  BP: 136/76 (!) 148/82 131/76 133/74  Pulse: 95 93 96 92  Resp: 13 (!) 9 (!) 8 18  Temp:   98.4 F (36.9 C)   TempSrc:   Oral   SpO2: 100% 99% 99% 94%  Weight:      Height:        Inpatient medications: . cinacalcet  60 mg Oral Q breakfast  . doxercalciferol  7 mcg Intravenous Q T,Th,Sa-HD  . fosamprenavir  700 mg Oral BID  . insulin aspart  0-9 Units Subcutaneous TID WC  . insulin glargine  20 Units Subcutaneous Q2200  . metoprolol tartrate  50 mg Oral Daily  . multivitamin with minerals  1 tablet Oral Daily  . pantoprazole  40 mg Intravenous Q12H  . raltegravir  400 mg Oral BID  . ritonavir  100 mg Oral Q breakfast  . sevelamer carbonate  800 mg Oral TID WC   . sodium chloride 20 mL/hr at 07/27/17 1038  . [START ON 07/29/2017] ferric gluconate (FERRLECIT/NULECIT) IV     acetaminophen **OR** acetaminophen, hydrALAZINE, ondansetron, zolpidem  Exam: General: alert obese ,pleasant, talkative AAM  HEENT: [place], MMM, Non icteric  Neck: no jvd Heart: RRR , no mur, rub orr gallop Lungs: CTA  , non labored breathing  Abdomen: Obese, soft , NT, ND Extremities: NO pedal edema / R BKA stump  Skin: no overt rash or bka stump ulcer Neuro: Alert OX4, no acute focal deficits  Dialysis Access: Pos bruit R FA AVF   Dialysis: TTS HD 4h 79min   500/800   124kg   3K/2.25 bath  AVF RFA  Hep 5000 - Hectorol 7   mcg IV/HD - Mircera 75 mcg  q 2wks  (last given 07/24/17) - Venofer  50mg  q wk hd    Assessment/Plan 1. GI Bleed - per GI, had flex sig today. Hb 8-9 range 2. ESRD -  HD TTS on schedule. Holding heparin w HD.   3. Hypertension/volume  - under dry wt 1-2kg.  BP's good 4. Anemia of CKD/ ABL - use venofer on HD x 5 then weekly /can dc po FE with  Fe levels monitored monthly at OP HD  5. HO A. FIB - On Coumadin - held per admit w/ GI bleed  6. Metabolic bone disease -  IV Vit d on HD / Phos binder when on diet ( [place]vel[place] )  7. DM type 2 Per admit  8. HIV on meds per admit    Plan - next HD Tuesday   [name] MD [facility] pager [phone]   07/27/2017, 12:41 PM    Recent Labs Lab 07/25/17 2201 07/26/17 0605  NA 136 137  K 4.0 4.0  CL 95* 99*  CO2 26 26  GLUCOSE 203* 158*  BUN 41* 45*  CREATININE 8.20* 8.80*  CALCIUM 8.7* 8.2*    Recent Labs Lab 07/25/17 2201  AST 19  ALT 16*  ALKPHOS 103  BILITOT 0.5  PROT 6.7  ALBUMIN 3.4*    Recent Labs Lab 07/26/17 1249 07/26/17 2050 07/27/17 0154  WBC 6.9 7.1 6.1  HGB 8.4* 9.1* 8.3*  HCT 25.4* 27.5* 24.7*  MCV 90.1 89.9 90.1  PLT 164 197 176   Iron/TIBC/Ferritin/ %  Sat No results found for: IRON, TIBC, FERRITIN, IRONPCTSAT

## 2017-07-27 NOTE — Op Note (Signed)
[facility] Patient Name: Thomas [name] Procedure Date : 07/27/2017 MRN: [id number] Attending MD: [name] , MD Date of Birth: May 30, 1966 CSN: [id number] Age: 51 Admit Type: Inpatient Procedure:                Flexible Sigmoidoscopy Indications:              Hematochezia, Acute post hemorrhagic anemia                            (received 2 U prc's) Providers:                [name], MD, Zenon Mayo, RN, Cherylynn Ridges, Technician Referring MD:              Medicines:                Fentanyl 25 micrograms IV, Midazolam 2 mg IV Complications:            No immediate complications. Estimated Blood Loss:     Estimated blood loss: none. Procedure:                Pre-Anesthesia Assessment:                           - Prior to the procedure, a History and Physical                            was performed, and patient medications and                            allergies were reviewed. The patient's tolerance of                            previous anesthesia was also reviewed. The risks                            and benefits of the procedure and the sedation                            options and risks were discussed with the patient.                            All questions were answered, and informed consent                            was obtained. Prior Anticoagulants: The patient has                            taken Coumadin (warfarin), last dose was 2 days                            prior to procedure. ASA Grade Assessment: III - A  patient with severe systemic disease. After                            reviewing the risks and benefits, the patient was                            deemed in satisfactory condition to undergo the                            procedure.                           After obtaining informed consent, the scope was                            passed under direct vision. The Colonoscope was                             introduced through the anus and advanced to the 40                            cm from the anal verge. The flexible sigmoidoscopy                            was accomplished without difficulty. The patient                            tolerated the procedure fairly well. The quality of                            the bowel preparation was adequate. Scope In: 9:18:20 AM Scope Out: 9:22:31 AM Total Procedure Duration: 0 hours 4 minutes 11 seconds  Findings:      The perianal examination was normal.      The digital rectal exam was normal. Pertinent negatives include normal       prostate (size, shape, and consistency).      The rectum and sigmoid colon appeared normal.      There is no endoscopic evidence of bleeding or blood in the lumen,       diverticula, inflammation, mass, polyps, ulcerations, colitis,       hemorrhoids or erosion in the rectum and in the sigmoid colon.      The retroflexed view of the distal rectum and anal verge was normal and       showed no anal or rectal abnormalities. Impression:               - The rectum and sigmoid colon are normal. No                            source of bleeding seen on this limited exam.                           - No specimens collected. Moderate Sedation:      Moderate (conscious) sedation was administered by the endoscopy nurse       and supervised by the  endoscopist. The following parameters were       monitored: oxygen saturation, heart rate, blood pressure, and response       to care. Total physician intraservice time was 9 minutes. Recommendation:           - Perform a colonoscopy tomorrow.                           - Perform an upper GI endoscopy tomorrow. Procedure Code(s):        --- Professional ---                           [phone], Sigmoidoscopy, flexible; diagnostic,                            including collection of specimen(s) by brushing or                            washing, when performed  (separate procedure) Diagnosis Code(s):        --- Professional ---                           K92.1, Melena (includes Hematochezia)                           D62, Acute posthemorrhagic anemia CPT copyright 2016 American [employer] Association. All rights reserved. The codes documented in this report are preliminary and upon coder review may  be revised to meet current compliance requirements. [name], MD 07/27/2017 10:07:55 AM This report has been signed electronically. Number of Addenda: 0

## 2017-07-28 ENCOUNTER — Encounter (HOSPITAL_COMMUNITY): Payer: Self-pay | Admitting: Emergency Medicine

## 2017-07-28 ENCOUNTER — Inpatient Hospital Stay (HOSPITAL_COMMUNITY): Payer: Medicare Other | Admitting: Anesthesiology

## 2017-07-28 ENCOUNTER — Encounter (HOSPITAL_COMMUNITY)
Admission: EM | Disposition: A | Discharge status: Home / Self Care | Payer: Self-pay | Source: Home / Self Care | Attending: Internal Medicine

## 2017-07-28 DIAGNOSIS — K226 Gastro-esophageal laceration-hemorrhage syndrome: Secondary | ICD-10-CM | POA: Diagnosis not present

## 2017-07-28 DIAGNOSIS — B20 Human immunodeficiency virus [HIV] disease: Secondary | ICD-10-CM | POA: Diagnosis not present

## 2017-07-28 DIAGNOSIS — N186 End stage renal disease: Secondary | ICD-10-CM | POA: Diagnosis not present

## 2017-07-28 DIAGNOSIS — Z992 Dependence on renal dialysis: Secondary | ICD-10-CM | POA: Diagnosis not present

## 2017-07-28 DIAGNOSIS — I480 Paroxysmal atrial fibrillation: Secondary | ICD-10-CM | POA: Diagnosis not present

## 2017-07-28 DIAGNOSIS — K922 Gastrointestinal hemorrhage, unspecified: Secondary | ICD-10-CM | POA: Diagnosis not present

## 2017-07-28 DIAGNOSIS — E119 Type 2 diabetes mellitus without complications: Secondary | ICD-10-CM | POA: Diagnosis not present

## 2017-07-28 DIAGNOSIS — D649 Anemia, unspecified: Secondary | ICD-10-CM | POA: Diagnosis not present

## 2017-07-28 DIAGNOSIS — D62 Acute posthemorrhagic anemia: Secondary | ICD-10-CM | POA: Diagnosis not present

## 2017-07-28 DIAGNOSIS — I132 Hypertensive heart and chronic kidney disease with heart failure and with stage 5 chronic kidney disease, or end stage renal disease: Secondary | ICD-10-CM | POA: Diagnosis not present

## 2017-07-28 HISTORY — PX: ESOPHAGOGASTRODUODENOSCOPY (EGD) WITH PROPOFOL: SHX5813

## 2017-07-28 LAB — GLUCOSE, CAPILLARY
Glucose-Capillary: 137 mg/dL — ABNORMAL HIGH (ref 65–99)
Glucose-Capillary: 148 mg/dL — ABNORMAL HIGH (ref 65–99)
Glucose-Capillary: 204 mg/dL — ABNORMAL HIGH (ref 65–99)
Glucose-Capillary: 215 mg/dL — ABNORMAL HIGH (ref 65–99)

## 2017-07-28 LAB — CBC
HEMATOCRIT: 25.1 % — AB (ref 39.0–52.0)
HEMOGLOBIN: 8.4 g/dL — AB (ref 13.0–17.0)
MCH: 30.3 pg (ref 26.0–34.0)
MCHC: 33.5 g/dL (ref 30.0–36.0)
MCV: 90.6 fL (ref 78.0–100.0)
Platelets: 204 10*3/uL (ref 150–400)
RBC: 2.77 MIL/uL — ABNORMAL LOW (ref 4.22–5.81)
RDW: 18.5 % — AB (ref 11.5–15.5)
WBC: 6.8 10*3/uL (ref 4.0–10.5)

## 2017-07-28 LAB — PROTIME-INR
INR: 1.07
Prothrombin Time: 13.9 seconds (ref 11.4–15.2)

## 2017-07-28 SURGERY — ESOPHAGOGASTRODUODENOSCOPY (EGD) WITH PROPOFOL
Anesthesia: Monitor Anesthesia Care

## 2017-07-28 MED ORDER — SODIUM CHLORIDE 0.9 % IV SOLN
INTRAVENOUS | Status: DC | PRN
  Administered 2017-07-28: 12:00:00 via INTRAVENOUS

## 2017-07-28 MED ORDER — PEG 3350-KCL-NA BICARB-NACL 420 G PO SOLR
4000.0000 mL | Freq: Once | ORAL | Status: AC
  Administered 2017-07-29: 4000 mL via ORAL
  Filled 2017-07-28: qty 4000

## 2017-07-28 MED ORDER — PROPOFOL 500 MG/50ML IV EMUL
INTRAVENOUS | Status: DC | PRN
  Administered 2017-07-28: 125 ug/kg/min via INTRAVENOUS

## 2017-07-28 MED ORDER — PROPOFOL 10 MG/ML IV BOLUS
INTRAVENOUS | Status: DC | PRN
  Administered 2017-07-28: 50 mg via INTRAVENOUS
  Administered 2017-07-28: 150 mg via INTRAVENOUS
  Administered 2017-07-28: 50 mg via INTRAVENOUS
  Administered 2017-07-28: 150 mg via INTRAVENOUS

## 2017-07-28 SURGICAL SUPPLY — 14 items

## 2017-07-28 NOTE — Interval H&P Note (Signed)
History and Physical Interval Note:  07/28/2017 12:25 PM  Thomas [name]  has presented today for surgery, with the diagnosis of Gastrointestinal bleeding  The various methods of treatment have been discussed with the patient and family. After consideration of risks, benefits and other options for treatment, the patient has consented to  Procedure(s): ESOPHAGOGASTRODUODENOSCOPY (EGD) WITH PROPOFOL (N/A) as a surgical intervention .  The patient's history has been reviewed, patient examined, no change in status, stable for surgery.  I have reviewed the patient's chart and labs.  Questions were answered to the patient's satisfaction.     [name]

## 2017-07-28 NOTE — Op Note (Signed)
[facility] Patient Name: Thomas [name] Procedure Date : 07/28/2017 MRN: [id number] Attending MD: [name] , MD Date of Birth: [birth year] CSN: [id number] Age: 51 Admit Type: Inpatient Procedure:                Upper GI endoscopy Indications:              Hematochezia Providers:                [name], MD, Cleda Daub, RN, Alan Mulder, Technician Referring MD:              Medicines:                Propofol per Anesthesia Complications:            No immediate complications. Estimated Blood Loss:     Estimated blood loss: none. Procedure:                Pre-Anesthesia Assessment:                           - Prior to the procedure, a History and Physical                            was performed, and patient medications and                            allergies were reviewed. The patient's tolerance of                            previous anesthesia was also reviewed. The risks                            and benefits of the procedure and the sedation                            options and risks were discussed with the patient.                            All questions were answered, and informed consent                            was obtained. Prior Anticoagulants: The patient has                            taken Coumadin (warfarin), last dose was 4 days                            prior to procedure. ASA Grade Assessment: III - A                            patient with severe systemic disease. After  reviewing the risks and benefits, the patient was                            deemed in satisfactory condition to undergo the                            procedure.                           After obtaining informed consent, the endoscope was                            passed under direct vision. Throughout the                            procedure, the patient's blood pressure, pulse, and     oxygen saturations were monitored continuously. The                            EG-[id number] ([id number]) scope was introduced through the                            mouth, and advanced to the second part of duodenum.                            The upper GI endoscopy was accomplished without                            difficulty. The patient tolerated the procedure                            well. Scope In: Scope Out: Findings:      The larynx was normal.      A 11 mm non-bleeding Mallory-Weiss tear with no stigmata of recent       bleeding was found.      The exam of the esophagus was otherwise normal.      The entire examined stomach was normal.      The cardia and gastric fundus were normal on retroflexion.      Bilious fluid was found on the greater curvature of the stomach.      The examined duodenum was normal. Impression:               - Normal larynx.                           - Mallory-Weiss tear.                           - Normal stomach.                           - Bilious gastric fluid.                           - Normal examined duodenum.                           -  No specimens collected. Moderate Sedation:      This patient was sedated with monitored anesthesia care, not moderate       sedation. Recommendation:           - Continue present medications.                           - Resume aspirin tomorrow and Coumadin (warfarin)                            tomorrow at prior doses. Refer to referring                            physician for further adjustment of therapy.                           - Resume previous diet. Procedure Code(s):        --- Professional ---                           [phone], Esophagogastroduodenoscopy, flexible,                            transoral; diagnostic, including collection of                            specimen(s) by brushing or washing, when performed                            (separate procedure) Diagnosis Code(s):        --- Professional  ---                           K22.6, Gastro-esophageal laceration-hemorrhage                            syndrome                           K92.1, Melena (includes Hematochezia) CPT copyright 2016 American Medical Association. All rights reserved. The codes documented in this report are preliminary and upon coder review may  be revised to meet current compliance requirements. [name], MD 07/28/2017 1:35:27 PM This report has been signed electronically. Number of Addenda: 0

## 2017-07-28 NOTE — Progress Notes (Signed)
Patient's endoscopy showed a resolving Mallory-Weiss tear. This is the presumed source of the patient's recent bleeding.  Meanwhile, the patient has never had a screening colonoscopy and he would like to have it done while he is here in the hospital, which I think makes the most sense, while his Coumadin has been reversed. We will aim to do that on Wednesday, prepping him tomorrow after dialysis.  Okay from my standpoint to resume Coumadin anticoagulation tomorrow, since it is unlikely he would be therapeutic by the time of his colonoscopy on Wednesday. Even if he was therapeutically anticoagulated, we could still do a diagnostic/screening colonoscopy at that time.  [name], M.D. Pager [phone] If no answer or after 5 PM call [phone]

## 2017-07-28 NOTE — Progress Notes (Signed)
Patient ID: Thomas [name], male   DOB: Jun 01, 1966, 51 y.o.   MRN: [id number]  [facility] Progress Note   Assessment/ Plan:   1. GI Bleed - With flexible sigmoidoscopy done earlier in the weekend and planned to undergo EGD/colonoscopy today. Reports no further bloody stools. 2. ESRD - HD on TTS schedule. Plan for next hemodialysis tomorrow-he does not have acute indications today.   3. Hypertension/volume Blood pressure appears well controlled at this time, monitor with GI bleed. 4. Anemia of Chronic kidney disease with recent worsening from acute blood loss (GI) -  continue intravenous iron and ESA-no indications at this time for PRBC transfusion 5. History of atrial fibrillation - previously on Coumadin - held per admit w/ GI bleed  6. Metabolic bone disease - continue Sensipar/pleural PTH suppression, continue sevelamer for phosphorus binding. 7. HIV Infection: Continue antiretroviral therapy   Subjective:   Reports doing well, inquires about plans after EGD/colonoscopy if negative.    Objective:   BP 123/61 (BP Location: Left Arm)   Pulse 83   Temp 98.2 F (36.8 C) (Oral)   Resp 20   Ht 6\' 6"  (1.981 m)   Wt 123.1 kg (271 lb 6.2 oz)   SpO2 97%   BMI 31.36 kg/m   Physical Exam: ELT:[id number] resting in bed CVS: Pulse regular rhythm, S1 and S2 normal Resp: Clear to auscultation, no rales/rhonchi Abd: Soft, obese, nontender Ext: No lower extremity edema  Labs: BMET  Recent Labs Lab 07/25/17 2201 07/26/17 0605  NA 136 137  K 4.0 4.0  CL 95* 99*  CO2 26 26  GLUCOSE 203* 158*  BUN 41* 45*  CREATININE 8.20* 8.80*  CALCIUM 8.7* 8.2*   CBC  Recent Labs Lab 07/26/17 2050 07/27/17 0154 07/27/17 1958 07/28/17 0439  WBC 7.1 6.1 6.2 6.8  HGB 9.1* 8.3* 8.6* 8.4*  HCT 27.5* 24.7* 26.5* 25.1*  MCV 89.9 90.1 91.4 90.6  PLT 197 176 211 204   Medications:    . amLODipine  5 mg Oral Daily  . cinacalcet  60 mg Oral Q breakfast  . doxercalciferol  7  mcg Intravenous Q T,Th,Sa-HD  . fosamprenavir  700 mg Oral BID  . insulin aspart  0-9 Units Subcutaneous TID WC  . insulin glargine  20 Units Subcutaneous Q2200  . metoprolol tartrate  50 mg Oral Daily  . multivitamin with minerals  1 tablet Oral Daily  . pantoprazole  40 mg Intravenous Q12H  . raltegravir  400 mg Oral BID  . ritonavir  100 mg Oral Q breakfast  . sevelamer carbonate  800 mg Oral TID WC   Elmarie Shiley, MD 07/28/2017, 10:15 AM

## 2017-07-28 NOTE — Transfer of Care (Signed)
Immediate Anesthesia Transfer of Care Note  Patient: Thomas [name]  Procedure(s) Performed: Procedure(s): ESOPHAGOGASTRODUODENOSCOPY (EGD) WITH PROPOFOL (N/A)  Patient Location: PACU  Anesthesia Type:MAC  Level of Consciousness: awake and sedated  Airway & Oxygen Therapy: Patient Spontanous Breathing and Patient connected to nasal cannula oxygen  Post-op Assessment: Report given to RN and Post -op Vital signs reviewed and stable  Post vital signs: Reviewed and stable  Last Vitals:  Vitals:   07/28/17 1140 07/28/17 1310  BP: 123/65 (!) 93/50  Pulse: 76 80  Resp: 13 17  Temp: 36.9 C     Last Pain:  Vitals:   07/28/17 1310  TempSrc: Oral  PainSc:          Complications: No apparent anesthesia complications

## 2017-07-28 NOTE — Anesthesia Postprocedure Evaluation (Signed)
Anesthesia Post Note  Patient: Arby Dahir  Procedure(s) Performed: Procedure(s) (LRB): ESOPHAGOGASTRODUODENOSCOPY (EGD) WITH PROPOFOL (N/A)     Patient location during evaluation: PACU Anesthesia Type: MAC Level of consciousness: awake, awake and alert and oriented Pain management: pain level controlled Respiratory status: spontaneous breathing, nonlabored ventilation and respiratory function stable Cardiovascular status: blood pressure returned to baseline Anesthetic complications: no    Last Vitals:  Vitals:   07/28/17 1315 07/28/17 1320  BP: 94/62 96/62  Pulse: 79 81  Resp: 18 20  Temp:      Last Pain:  Vitals:   07/28/17 1500  TempSrc: Oral  PainSc:                  Kristain Filo COKER

## 2017-07-28 NOTE — Progress Notes (Signed)
Dialysis called and informed of Dr. Osborn Coho care order instruction to have patient dialyzed 07/29/17 in the AM.

## 2017-07-28 NOTE — Anesthesia Procedure Notes (Signed)
Procedure Name: MAC Date/Time: 07/28/2017 12:50 PM Performed by: Izora Gala Pre-anesthesia Checklist: Patient identified, Emergency Drugs available, Suction available and Patient being monitored Patient Re-evaluated:Patient Re-evaluated prior to induction Oxygen Delivery Method: Nasal cannula Induction Type: IV induction Placement Confirmation: positive ETCO2

## 2017-07-28 NOTE — Progress Notes (Signed)
PROGRESS NOTE  Thomas [name] URK:[id number] DOB: [birth year] DOA: 07/25/2017 PCP: Patient, No Pcp Per  HPI/Recap of past 24 hours:  Npo, awaiting gi scope, denies pain, no n/v, no fever,  Report had bm last night, no blood seen  Assessment/Plan: Principal Problem:   GIB (gastrointestinal bleeding) Active Problems:   ESRD on dialysis ([place])   Type 2 diabetes mellitus with complication (HCC)   HIV disease ([place])   Anemia due to end stage renal disease (HCC)   PAF (paroxysmal atrial fibrillation) (HCC)   Symptomatic anemia  GI bleed;  -blood per rectum x3 episode started yesterday 3pm, last episode 2am on 7/28, denies pain -He was started on ppi drip, changed to ppi bid, suspect lower gi bleed -S/p vitk on admission due to on coumadin ,S/p prbc x2units -hgb remain above 8 -Unremarkable sigmoidscope, CT ab/pel no mention of diverticula -To have EGD/colonoscopy on 7/30 per GI    PAF on coumadin -INR 2.13, sinus rhythm here -He received vit k on admission  -Resume coumadin when ok with gi  ESRD on HD TTS Nephrology consulted   Acute on chronic anemia, possible blood loss anemia on baseline anemia of chronic disease -Patient report feeling dizzy after bloody diarrhea -S/p prbcx2units  Insulin dependent dm2 a1c 11.7 from 04/2017 Continue insulin, am blood sugar around 158  HTN:  presented with low normal bp in the setting of gi bleed Lopressor continued, norvasc and lisinopril held since admission, resume low dose norvasc on 7/29 Monitor bp, continue adjust bp meds  HIV CD4 310 in 04/2017 Continue home meds  Code Status: full  Family Communication: patient   Disposition Plan: home in 1-2 days , need gi clearance   Consultants:  Howie Ill  nephrology  Procedures:  prbc transfusion  Dialysis  Sigmoidoscope on 7/29  Egd/colonscopy planned on 7/30  Antibiotics:  none   Objective: BP 96/62   Pulse 81   Temp 98.5 F (36.9 C) (Oral)   Resp 20   Ht  6\' 6"  (1.981 m)   Wt 122.9 kg (271 lb)   SpO2 98%   BMI 31.32 kg/m   Intake/Output Summary (Last 24 hours) at 07/28/17 1330 Last data filed at 07/28/17 1302  Gross per 24 hour  Intake           627.33 ml  Output                0 ml  Net           627.33 ml   Filed Weights   07/27/17 0828 07/28/17 0630 07/28/17 1140  Weight: 121.8 kg (268 lb 8.3 oz) 123.1 kg (271 lb 6.2 oz) 122.9 kg (271 lb)    Exam:   General:  NAD  Cardiovascular: RRR  Respiratory: CTABL  Abdomen: Soft/ND/NT, positive BS  Musculoskeletal: No Edema  Neuro: aaox3  Data Reviewed: Basic Metabolic Panel:  Recent Labs Lab 07/25/17 2201 07/26/17 0605  NA 136 137  K 4.0 4.0  CL 95* 99*  CO2 26 26  GLUCOSE 203* 158*  BUN 41* 45*  CREATININE 8.20* 8.80*  CALCIUM 8.7* 8.2*   Liver Function Tests:  Recent Labs Lab 07/25/17 2201  AST 19  ALT 16*  ALKPHOS 103  BILITOT 0.5  PROT 6.7  ALBUMIN 3.4*   No results for input(s): LIPASE, AMYLASE in the last 168 hours. No results for input(s): AMMONIA in the last 168 hours. CBC:  Recent Labs Lab 07/26/17 1249 07/26/17 2050 07/27/17 0154 07/27/17 1958  07/28/17 0439  WBC 6.9 7.1 6.1 6.2 6.8  HGB 8.4* 9.1* 8.3* 8.6* 8.4*  HCT 25.4* 27.5* 24.7* 26.5* 25.1*  MCV 90.1 89.9 90.1 91.4 90.6  PLT 164 197 176 211 204   Cardiac Enzymes:   No results for input(s): CKTOTAL, CKMB, CKMBINDEX, TROPONINI in the last 168 hours. BNP (last 3 results)  Recent Labs  05/05/17 1636  BNP 1,980.3*    ProBNP (last 3 results) No results for input(s): PROBNP in the last 8760 hours.  CBG:  Recent Labs Lab 07/27/17 0707 07/27/17 1218 07/27/17 1642 07/27/17 2217 07/28/17 0640  GLUCAP 194* 201* 148* 145* 148*    Recent Results (from the past 240 hour(s))  MRSA PCR Screening     Status: None   Collection Time: 07/26/17  6:42 AM  Result Value Ref Range Status   MRSA by PCR NEGATIVE NEGATIVE Final    Comment:        The GeneXpert MRSA Assay  (FDA approved for NASAL specimens only), is one component of a comprehensive MRSA colonization surveillance program. It is not intended to diagnose MRSA infection nor to guide or monitor treatment for MRSA infections.      Studies: No results found.  Scheduled Meds: . [MAR Hold] amLODipine  5 mg Oral Daily  . [MAR Hold] cinacalcet  60 mg Oral Q breakfast  . [MAR Hold] doxercalciferol  7 mcg Intravenous Q T,Th,Sa-HD  . [MAR Hold] fosamprenavir  700 mg Oral BID  . [MAR Hold] insulin aspart  0-9 Units Subcutaneous TID WC  . [MAR Hold] insulin glargine  20 Units Subcutaneous Q2200  . [MAR Hold] metoprolol tartrate  50 mg Oral Daily  . [MAR Hold] multivitamin with minerals  1 tablet Oral Daily  . [MAR Hold] pantoprazole  40 mg Intravenous Q12H  . [MAR Hold] raltegravir  400 mg Oral BID  . [MAR Hold] ritonavir  100 mg Oral Q breakfast  . [MAR Hold] sevelamer carbonate  800 mg Oral TID WC    Continuous Infusions: . sodium chloride Stopped (07/27/17 1400)  . [MAR Hold] ferric gluconate (FERRLECIT/NULECIT) IV       Time spent: >67mins  Harper Smoker MD, PhD  [facility] Pager [phone]. If 7PM-7AM, please contact night-coverage at [email or web address], password [facility] 07/28/2017, 1:30 PM  LOS: 2 days

## 2017-07-28 NOTE — Anesthesia Preprocedure Evaluation (Signed)
Anesthesia Evaluation  Patient identified by MRN, date of birth, ID band Patient awake    Reviewed: Allergy & Precautions, NPO status , Patient's Chart, lab work & pertinent test results  Airway Mallampati: II  TM Distance: >3 FB Neck ROM: Full    Dental  (+) Teeth Intact, Dental Advisory Given   Pulmonary    breath sounds clear to auscultation       Cardiovascular hypertension,  Rhythm:Regular Rate:Normal     Neuro/Psych    GI/Hepatic   Endo/Other  diabetes  Renal/GU      Musculoskeletal   Abdominal   Peds  Hematology   Anesthesia Other Findings   Reproductive/Obstetrics                             Anesthesia Physical Anesthesia Plan  ASA: III  Anesthesia Plan: MAC   Post-op Pain Management:    Induction: Intravenous  PONV Risk Score and Plan: Ondansetron and Dexamethasone  Airway Management Planned: Natural Airway and Nasal Cannula  Additional Equipment:   Intra-op Plan:   Post-operative Plan:   Informed Consent: I have reviewed the patients History and Physical, chart, labs and discussed the procedure including the risks, benefits and alternatives for the proposed anesthesia with the patient or authorized representative who has indicated his/her understanding and acceptance.     Plan Discussed with: CRNA and Anesthesiologist  Anesthesia Plan Comments:         Anesthesia Quick Evaluation

## 2017-07-29 ENCOUNTER — Encounter (HOSPITAL_COMMUNITY): Payer: Self-pay | Admitting: Gastroenterology

## 2017-07-29 DIAGNOSIS — D649 Anemia, unspecified: Secondary | ICD-10-CM | POA: Diagnosis not present

## 2017-07-29 DIAGNOSIS — E119 Type 2 diabetes mellitus without complications: Secondary | ICD-10-CM | POA: Diagnosis not present

## 2017-07-29 DIAGNOSIS — D631 Anemia in chronic kidney disease: Secondary | ICD-10-CM | POA: Diagnosis not present

## 2017-07-29 DIAGNOSIS — B20 Human immunodeficiency virus [HIV] disease: Secondary | ICD-10-CM | POA: Diagnosis not present

## 2017-07-29 DIAGNOSIS — Z992 Dependence on renal dialysis: Secondary | ICD-10-CM | POA: Diagnosis not present

## 2017-07-29 DIAGNOSIS — I480 Paroxysmal atrial fibrillation: Secondary | ICD-10-CM | POA: Diagnosis not present

## 2017-07-29 DIAGNOSIS — Z794 Long term (current) use of insulin: Secondary | ICD-10-CM | POA: Diagnosis not present

## 2017-07-29 DIAGNOSIS — Z5181 Encounter for therapeutic drug level monitoring: Secondary | ICD-10-CM

## 2017-07-29 DIAGNOSIS — K922 Gastrointestinal hemorrhage, unspecified: Secondary | ICD-10-CM | POA: Diagnosis not present

## 2017-07-29 DIAGNOSIS — N186 End stage renal disease: Secondary | ICD-10-CM | POA: Diagnosis not present

## 2017-07-29 DIAGNOSIS — K226 Gastro-esophageal laceration-hemorrhage syndrome: Secondary | ICD-10-CM | POA: Diagnosis not present

## 2017-07-29 DIAGNOSIS — Z7901 Long term (current) use of anticoagulants: Secondary | ICD-10-CM | POA: Diagnosis not present

## 2017-07-29 LAB — TYPE AND SCREEN
ABO/RH(D): B POS
Antibody Screen: NEGATIVE
UNIT DIVISION: 0
Unit division: 0
Unit division: 0

## 2017-07-29 LAB — BPAM RBC
BLOOD PRODUCT EXPIRATION DATE: 201808172359
BLOOD PRODUCT EXPIRATION DATE: 201808202359
Blood Product Expiration Date: 201808172359
ISSUE DATE / TIME: 201807280035
ISSUE DATE / TIME: 201807280324
UNIT TYPE AND RH: 7300
Unit Type and Rh: 7300
Unit Type and Rh: 7300

## 2017-07-29 LAB — RENAL FUNCTION PANEL
ANION GAP: 13 (ref 5–15)
Albumin: 3 g/dL — ABNORMAL LOW (ref 3.5–5.0)
BUN: 49 mg/dL — ABNORMAL HIGH (ref 6–20)
CHLORIDE: 101 mmol/L (ref 101–111)
CO2: 22 mmol/L (ref 22–32)
Calcium: 8.6 mg/dL — ABNORMAL LOW (ref 8.9–10.3)
Creatinine, Ser: 11.41 mg/dL — ABNORMAL HIGH (ref 0.61–1.24)
GFR, EST AFRICAN AMERICAN: 5 mL/min — AB (ref >60)
GFR, EST NON AFRICAN AMERICAN: 4 mL/min — AB (ref >60)
Glucose, Bld: 201 mg/dL — ABNORMAL HIGH (ref 65–99)
POTASSIUM: 3.9 mmol/L (ref 3.5–5.1)
Phosphorus: 6.2 mg/dL — ABNORMAL HIGH (ref 2.5–4.6)
Sodium: 136 mmol/L (ref 135–145)

## 2017-07-29 LAB — GLUCOSE, CAPILLARY
GLUCOSE-CAPILLARY: 211 mg/dL — AB (ref 65–99)
GLUCOSE-CAPILLARY: 122 mg/dL — AB (ref 65–99)
GLUCOSE-CAPILLARY: 217 mg/dL — AB (ref 65–99)
Glucose-Capillary: 196 mg/dL — ABNORMAL HIGH (ref 65–99)

## 2017-07-29 MED ORDER — DOXERCALCIFEROL 4 MCG/2ML IV SOLN
INTRAVENOUS | Status: AC
  Filled 2017-07-29: qty 4

## 2017-07-29 MED ORDER — SODIUM CHLORIDE 0.9 % IV SOLN
INTRAVENOUS | Status: DC
  Administered 2017-07-30 (×2): via INTRAVENOUS

## 2017-07-29 MED ORDER — METOPROLOL TARTRATE 25 MG PO TABS
25.0000 mg | ORAL_TABLET | Freq: Two times a day (BID) | ORAL | Status: DC
  Administered 2017-07-29 – 2017-07-30 (×2): 25 mg via ORAL
  Filled 2017-07-29 (×2): qty 1

## 2017-07-29 MED ORDER — WARFARIN - PHARMACIST DOSING INPATIENT: Freq: Every day | Status: DC

## 2017-07-29 MED ORDER — WARFARIN SODIUM 5 MG PO TABS
15.0000 mg | ORAL_TABLET | Freq: Once | ORAL | Status: AC
  Administered 2017-07-29: 15 mg via ORAL
  Filled 2017-07-29: qty 3
  Filled 2017-07-29: qty 2

## 2017-07-29 MED ORDER — RITONAVIR 100 MG PO TABS
100.0000 mg | ORAL_TABLET | Freq: Every day | ORAL | Status: DC
  Administered 2017-07-30: 100 mg via ORAL
  Filled 2017-07-29: qty 1

## 2017-07-29 NOTE — Progress Notes (Signed)
Hemoglobin not repeated today, but had been stable for several days.  Endoscopic findings from yesterday reviewed. It is not entirely clear to me whether the patient's drop in hemoglobin while on Coumadin was strictly the consequence of the observed Mallory-Weiss tear, so I do feel that full colonoscopy is appropriate, both for screening and for diagnostic purposes.  Patient was initially reluctant to miss another meal and undergo colonoscopy prep, but after description of the purpose, rationale, and risks of the procedure, he is agreeable to proceed with prep, and with the procedure tomorrow as previously planned.  Thomas [name], M.D. Pager [phone] If no answer or after 5 PM call [phone]

## 2017-07-29 NOTE — Progress Notes (Signed)
Patient ID: Thomas [name], male   DOB: 1966/05/27, 51 y.o.   MRN: [id number]  [facility] Progress Note   Assessment/ Plan:   1. GI Bleed - Reports no further bloody stools and states that he had a comfortable night- EGD yesterday showed Mallory-Weiss tear that is the presumed source of his GIB. Dr.Buccini planning for screening colonoscopy tomorrow (patient appears somewhat reluctant).  2. ESRD - HD on TTS schedule. Continue HD today with daily assessment for acute HD needs.   3. Hypertension/volume Blood pressure appears well controlled at this time, will continue to monitor trend. 4. Anemia of Chronic kidney disease with recent worsening from acute blood loss (GI) -  continue intravenous iron and ESA-no indications at this time for PRBC transfusion 5. History of atrial fibrillation - Initially held because of GI bleeding-- instructions to restart coumadin today and undertake colonoscopy before INR therapeutic.   6. Metabolic bone disease - continue Sensipar/pleural PTH suppression, continue sevelamer for phosphorus binding. 7. HIV Infection: Continue antiretroviral therapy   Subjective:   Reports an uneventful night- denies further GI bleeding.     Objective:   BP 127/79   Pulse 84   Temp 98 F (36.7 C) (Oral)   Resp 20   Ht 6\' 6"  (1.981 m)   Wt 124.6 kg (274 lb 11.1 oz)   SpO2 98%   BMI 31.74 kg/m   Physical Exam: [id number] resting in dialysis CVS: Pulse regular rhythm, S1 and S2 normal Resp: Clear to auscultation, no rales/rhonchi Abd: Soft, obese, nontender Ext: No lower extremity edema  Labs: BMET  Recent Labs Lab 07/25/17 2201 07/26/17 0605 07/29/17 0809  NA 136 137 136  K 4.0 4.0 3.9  CL 95* 99* 101  CO2 26 26 22   GLUCOSE 203* 158* 201*  BUN 41* 45* 49*  CREATININE 8.20* 8.80* 11.41*  CALCIUM 8.7* 8.2* 8.6*  PHOS  --   --  6.2*   CBC  Recent Labs Lab 07/26/17 2050 07/27/17 0154 07/27/17 1958 07/28/17 0439  WBC 7.1 6.1 6.2 6.8   HGB 9.1* 8.3* 8.6* 8.4*  HCT 27.5* 24.7* 26.5* 25.1*  MCV 89.9 90.1 91.4 90.6  PLT 197 176 211 204   Medications:    . amLODipine  5 mg Oral Daily  . cinacalcet  60 mg Oral Q breakfast  . doxercalciferol  7 mcg Intravenous Q T,Th,Sa-HD  . fosamprenavir  700 mg Oral BID  . insulin aspart  0-9 Units Subcutaneous TID WC  . insulin glargine  20 Units Subcutaneous Q2200  . metoprolol tartrate  50 mg Oral Daily  . multivitamin with minerals  1 tablet Oral Daily  . pantoprazole  40 mg Intravenous Q12H  . polyethylene glycol-electrolytes  4,000 mL Oral Once  . raltegravir  400 mg Oral BID  . ritonavir  100 mg Oral Q breakfast  . sevelamer carbonate  800 mg Oral TID WC  . warfarin  15 mg Oral ONCE-1800  . Warfarin - Pharmacist Dosing Inpatient   Does not apply Q7341   Elmarie Shiley, MD 07/29/2017, 9:49 AM

## 2017-07-29 NOTE — Progress Notes (Signed)
Pt back from HD upset that he is on clear liquid diet and unable to order lunch.  Reminded him that he is for colonoscopy tomorrow.  At this time he states that he prefers to have the procedure as out patient.  Dr. Cristina Gong notified.  Received VO to allow pt to have lunch, then clears until MD able to come speak with him later today to clarify whether or not pt would like to have tomorrow or as outpatient.  Pt aware and in agreement of plan

## 2017-07-29 NOTE — Progress Notes (Addendum)
PROGRESS NOTE  Thomas [name] RKY:[id number] DOB: 11-07-1966 DOA: 07/25/2017 PCP: Patient, No Pcp Per  Brief Summary:   Hx of afib on Coumadin,  diabetes mellitus,  ESRD-HD, HIV, anemia due to ESRD, combined systolic and diastolic CHF, left BKA, who presents with blood in stool,  with diziness-->2 units of blood. INR 2.13-->give 5 mg of Vk.  Nephrology/[facility] consulted  Resume coumadin on 7/31, colonoscopy on 8/1, then likely d/c on 8/1 after colonoscopy    HPI/Recap of past 24 hours:  He returned from HD today, denies pain, no n/v, no fever,  Report no more blood in stool, he is not happy about npo for procedure   Assessment/Plan: Principal Problem:   GIB (gastrointestinal bleeding) Active Problems:   ESRD on dialysis ([place])   Type 2 diabetes mellitus with complication ([place])   HIV disease ([place])   Anemia due to end stage renal disease ([place])   PAF (paroxysmal atrial fibrillation) ([facility])   Symptomatic anemia  GI bleed, acute blood loss anemia required blood transfusion: -blood per rectum x3 episode started  3pm the day before coming to the ED, last episode 2am on 7/28, denies pain -He was started on ppi drip, changed to ppi bid,  -S/p vitk on admission due to on coumadin ,S/p prbc x2units -Unremarkable sigmoidscope, CT ab/pel no mention of diverticula -EGD on 7/30 "Mallory-Weiss tear" -colonoscopy on 8/1, gi oked to coumadin resumed on 7/31. Anticipate discharge home after colonoscopy on 8/1.    PAF on coumadin -INR 2.13 on admission, sinus rhythm here -He received vit k on admission  -GI oked to Resume coumadin on 7/31  ESRD on HD TTS Nephrology consulted   Acute on chronic anemia, possible blood loss anemia on baseline anemia of chronic disease -Patient report feeling dizzy after bloody diarrhea -S/p prbcx2units -iv iron during HD per nephrology  Insulin dependent dm2 a1c 11.7 from 04/2017 Continue insulin, am blood sugar around 158-203  HTN:  -presented with  low normal bp in the setting of gi bleed -Lopressor listed on home meds list as taking once a day, patient does not remember whether he is on lopressor or toprol. he is getting lopressor 25mg  bid while in the hospital  -norvasc and lisinopril held since admission, resume low dose norvasc on 7/29 -Monitor bp, continue adjust bp meds  HIV CD4 310 in 04/2017 Continue home meds  Code Status: full  Family Communication: patient   Disposition Plan: likely home on 8/1 after colonoscopy with gi clearance   Consultants:  Howie Ill  nephrology  Procedures:  prbc transfusion  Dialysis  Sigmoidoscope on 7/29  Egd on 7/30  colonscopy planned on 8/1  Antibiotics:  none   Objective: BP 134/70 (BP Location: Left Arm)   Pulse 84   Temp 98.9 F (37.2 C) (Oral)   Resp (!) 21   Ht 6\' 6"  (1.981 m)   Wt 122.1 kg (269 lb 2.9 oz)   SpO2 94%   BMI 31.11 kg/m   Intake/Output Summary (Last 24 hours) at 07/29/17 1808 Last data filed at 07/29/17 1158  Gross per 24 hour  Intake              120 ml  Output             2500 ml  Net            -2380 ml   Filed Weights   07/29/17 0415 07/29/17 0743 07/29/17 1158  Weight: 124.2 kg (273 lb 13 oz)  124.6 kg (274 lb 11.1 oz) 122.1 kg (269 lb 2.9 oz)    Exam:   General:  NAD  Cardiovascular: RRR  Respiratory: CTABL  Abdomen: Soft/ND/NT, positive BS  Musculoskeletal: No Edema, s/p amputation left leg, ( with prosthesis) , right av fistula  Neuro: aaox3  Data Reviewed: Basic Metabolic Panel:  Recent Labs Lab 07/25/17 2201 07/26/17 0605 07/29/17 0809  NA 136 137 136  K 4.0 4.0 3.9  CL 95* 99* 101  CO2 26 26 22   GLUCOSE 203* 158* 201*  BUN 41* 45* 49*  CREATININE 8.20* 8.80* 11.41*  CALCIUM 8.7* 8.2* 8.6*  PHOS  --   --  6.2*   Liver Function Tests:  Recent Labs Lab 07/25/17 2201 07/29/17 0809  AST 19  --   ALT 16*  --   ALKPHOS 103  --   BILITOT 0.5  --   PROT 6.7  --   ALBUMIN 3.4* 3.0*   No results  for input(s): LIPASE, AMYLASE in the last 168 hours. No results for input(s): AMMONIA in the last 168 hours. CBC:  Recent Labs Lab 07/26/17 1249 07/26/17 2050 07/27/17 0154 07/27/17 1958 07/28/17 0439  WBC 6.9 7.1 6.1 6.2 6.8  HGB 8.4* 9.1* 8.3* 8.6* 8.4*  HCT 25.4* 27.5* 24.7* 26.5* 25.1*  MCV 90.1 89.9 90.1 91.4 90.6  PLT 164 197 176 211 204   Cardiac Enzymes:   No results for input(s): CKTOTAL, CKMB, CKMBINDEX, TROPONINI in the last 168 hours. BNP (last 3 results)  Recent Labs  05/05/17 1636  BNP 1,980.3*    ProBNP (last 3 results) No results for input(s): PROBNP in the last 8760 hours.  CBG:  Recent Labs Lab 07/28/17 1647 07/28/17 2104 07/29/17 0557 07/29/17 1233 07/29/17 1619  GLUCAP 204* 215* 211* 122* 196*    Recent Results (from the past 240 hour(s))  MRSA PCR Screening     Status: None   Collection Time: 07/26/17  6:42 AM  Result Value Ref Range Status   MRSA by PCR NEGATIVE NEGATIVE Final    Comment:        The GeneXpert MRSA Assay (FDA approved for NASAL specimens only), is one component of a comprehensive MRSA colonization surveillance program. It is not intended to diagnose MRSA infection nor to guide or monitor treatment for MRSA infections.      Studies: No results found.  Scheduled Meds: . amLODipine  5 mg Oral Daily  . cinacalcet  60 mg Oral Q breakfast  . doxercalciferol  7 mcg Intravenous Q T,Th,Sa-HD  . fosamprenavir  700 mg Oral BID  . insulin aspart  0-9 Units Subcutaneous TID WC  . insulin glargine  20 Units Subcutaneous Q2200  . metoprolol tartrate  25 mg Oral BID  . multivitamin with minerals  1 tablet Oral Daily  . pantoprazole  40 mg Intravenous Q12H  . polyethylene glycol-electrolytes  4,000 mL Oral Once  . raltegravir  400 mg Oral BID  . ritonavir  100 mg Oral Q breakfast  . sevelamer carbonate  800 mg Oral TID WC  . warfarin  15 mg Oral ONCE-1800  . Warfarin - Pharmacist Dosing Inpatient   Does not apply q1800     Continuous Infusions: . ferric gluconate (FERRLECIT/NULECIT) IV 125 mg (07/29/17 0929)     Time spent: >71mins  Thomas Poplaski MD, PhD  [facility] Pager [phone]. If 7PM-7AM, please contact night-coverage at [email or web address], password [facility] 07/29/2017, 6:08 PM  LOS: 3 days

## 2017-07-29 NOTE — Progress Notes (Signed)
ANTICOAGULATION CONSULT NOTE - Initial Consult  Pharmacy Consult for Warfarin Indication: atrial fibrillation  No Known Allergies  Patient Measurements: Height: 6\' 6"  (198.1 cm) Weight: 274 lb 11.1 oz (124.6 kg) IBW/kg (Calculated) : 91.4 Heparin Dosing Weight:   Vital Signs: Temp: 98 F (36.7 C) (07/31 0743) Temp Source: Oral (07/31 0743) BP: 134/81 (07/31 0748) Pulse Rate: 84 (07/31 0748)  Labs:  Recent Labs  07/27/17 0154 07/27/17 1958 07/28/17 0439  HGB 8.3* 8.6* 8.4*  HCT 24.7* 26.5* 25.1*  PLT 176 211 204  LABPROT  --   --  13.9  INR  --   --  1.07    Estimated Creatinine Clearance: 14.7 mL/min (A) (by C-G formula based on SCr of 8.8 mg/dL (H)).   Medical History: Past Medical History:  Diagnosis Date  . Abdominal pain   . Anemia   . Diabetes ([place])   . ESRD (end stage renal disease) ([place])   . ESRD on dialysis ([place]) 03/13/2017  . Fatigue   . HIV disease ([place])   . Hyperparathyroidism, secondary ([place])   . Hypertension   . 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 Pulmonary congestion   . SOB (shortness of breath)   . SVT (supraventricular tachycardia) ([place])    ? afib or atrial flutter s/p TEE/DCCV with subsequent ablation due to reoccurrence in [place]    Medications:  Infusions:  . ferric gluconate (FERRLECIT/NULECIT) IV      Assessment: 51 yo male presents to ED with rectal bleeding/GI bleeding. Patient on PTA warfarin for Afib which was held on admission.  INR 2.13 and Hgb 7.9 on admission, and patient was given Vit K 5mg  IV and 2 units of pRBCs on 7/28. INR decreased to 1.07 and Hgb had stabilized to 8.4. Endoscopy of 7/30 showed a resolving Mallory-Weiss tear presumed to be the source of the GIB. GI note states can restart warfarin from their standpoint. Pharmacy has been consulted to dose warfarin.  PTA regimen: 10mg /day except 7.5mg  SS  Goal of Therapy:  INR 2-3   Plan:  Warfarin 15 mg po x  1 Monitor daily INR Monitor CBC and s/sx of bleeding  Leroy Libman, PharmD Pharmacy Resident Pager: [phone]  07/29/2017,8:39 AM

## 2017-07-29 NOTE — Plan of Care (Signed)
Problem: Education: Goal: Ability to identify signs and symptoms of gastrointestinal bleeding will improve Outcome: Progressing Explained to patient need to complete bowel prep t have a successful colonscopy

## 2017-07-29 NOTE — Procedures (Signed)
Patient seen on Hemodialysis. QB 400, UF goal 3L Treatment adjusted as needed.  Jakiah Goree MD [facility]. Office # [phone] Pager # [phone] 9:57 AM  

## 2017-07-30 ENCOUNTER — Inpatient Hospital Stay (HOSPITAL_COMMUNITY): Payer: Medicare Other | Admitting: Anesthesiology

## 2017-07-30 ENCOUNTER — Encounter (HOSPITAL_COMMUNITY)
Admission: EM | Disposition: A | Discharge status: Home / Self Care | Payer: Self-pay | Source: Home / Self Care | Attending: Internal Medicine

## 2017-07-30 ENCOUNTER — Encounter (HOSPITAL_COMMUNITY): Payer: Self-pay | Admitting: Gastroenterology

## 2017-07-30 DIAGNOSIS — N186 End stage renal disease: Secondary | ICD-10-CM | POA: Diagnosis not present

## 2017-07-30 DIAGNOSIS — D62 Acute posthemorrhagic anemia: Secondary | ICD-10-CM | POA: Diagnosis not present

## 2017-07-30 DIAGNOSIS — I132 Hypertensive heart and chronic kidney disease with heart failure and with stage 5 chronic kidney disease, or end stage renal disease: Secondary | ICD-10-CM | POA: Diagnosis not present

## 2017-07-30 DIAGNOSIS — K922 Gastrointestinal hemorrhage, unspecified: Secondary | ICD-10-CM | POA: Diagnosis not present

## 2017-07-30 DIAGNOSIS — K226 Gastro-esophageal laceration-hemorrhage syndrome: Secondary | ICD-10-CM | POA: Diagnosis not present

## 2017-07-30 HISTORY — PX: COLONOSCOPY WITH PROPOFOL: SHX5780

## 2017-07-30 LAB — PROTIME-INR
INR: 1.1
Prothrombin Time: 14.2 seconds (ref 11.4–15.2)

## 2017-07-30 LAB — CBC
HCT: 24.9 % — ABNORMAL LOW (ref 39.0–52.0)
Hemoglobin: 8.2 g/dL — ABNORMAL LOW (ref 13.0–17.0)
MCH: 30.3 pg (ref 26.0–34.0)
MCHC: 32.9 g/dL (ref 30.0–36.0)
MCV: 91.9 fL (ref 78.0–100.0)
PLATELETS: 244 10*3/uL (ref 150–400)
RBC: 2.71 MIL/uL — AB (ref 4.22–5.81)
RDW: 18.6 % — AB (ref 11.5–15.5)
WBC: 6.4 10*3/uL (ref 4.0–10.5)

## 2017-07-30 LAB — GLUCOSE, CAPILLARY
Glucose-Capillary: 133 mg/dL — ABNORMAL HIGH (ref 65–99)
Glucose-Capillary: 114 mg/dL — ABNORMAL HIGH (ref 65–99)

## 2017-07-30 LAB — POCT I-STAT 4, (NA,K, GLUC, HGB,HCT)
Glucose, Bld: 138 mg/dL — ABNORMAL HIGH (ref 65–99)
HEMATOCRIT: 23 % — AB (ref 39.0–52.0)
HEMOGLOBIN: 7.8 g/dL — AB (ref 13.0–17.0)
Potassium: 4 mmol/L (ref 3.5–5.1)
SODIUM: 140 mmol/L (ref 135–145)

## 2017-07-30 SURGERY — COLONOSCOPY WITH PROPOFOL
Anesthesia: Monitor Anesthesia Care

## 2017-07-30 MED ORDER — MIDAZOLAM HCL 5 MG/5ML IJ SOLN
INTRAMUSCULAR | Status: DC | PRN
  Administered 2017-07-30: 1 mg via INTRAVENOUS

## 2017-07-30 MED ORDER — FENTANYL CITRATE (PF) 100 MCG/2ML IJ SOLN
INTRAMUSCULAR | Status: DC | PRN
  Administered 2017-07-30: 100 ug via INTRAVENOUS

## 2017-07-30 MED ORDER — MIDAZOLAM HCL 2 MG/2ML IJ SOLN: 0.5000 mg | Freq: Once | INTRAMUSCULAR | Status: DC | PRN

## 2017-07-30 MED ORDER — WARFARIN SODIUM 7.5 MG PO TABS: 15.0000 mg | ORAL_TABLET | ORAL | Status: DC

## 2017-07-30 MED ORDER — METOPROLOL TARTRATE 25 MG PO TABS: 25.0000 mg | ORAL_TABLET | Freq: Two times a day (BID) | ORAL | 0 refills | Status: DC

## 2017-07-30 MED ORDER — WARFARIN SODIUM 10 MG PO TABS: 10.0000 mg | ORAL_TABLET | ORAL | Status: DC

## 2017-07-30 MED ORDER — PROMETHAZINE HCL 25 MG/ML IJ SOLN: 6.2500 mg | INTRAMUSCULAR | Status: DC | PRN

## 2017-07-30 MED ORDER — AMLODIPINE BESYLATE 5 MG PO TABS: 5.0000 mg | ORAL_TABLET | Freq: Every day | ORAL | 0 refills | Status: DC

## 2017-07-30 MED ORDER — MEPERIDINE HCL 25 MG/ML IJ SOLN: 6.2500 mg | INTRAMUSCULAR | Status: DC | PRN

## 2017-07-30 MED ORDER — PANTOPRAZOLE SODIUM 40 MG PO TBEC: 40.0000 mg | DELAYED_RELEASE_TABLET | Freq: Two times a day (BID) | ORAL | 1 refills | Status: DC

## 2017-07-30 MED ORDER — LIDOCAINE 2% (20 MG/ML) 5 ML SYRINGE
INTRAMUSCULAR | Status: DC | PRN
  Administered 2017-07-30: 100 mg via INTRAVENOUS

## 2017-07-30 MED ORDER — PROPOFOL 10 MG/ML IV BOLUS
INTRAVENOUS | Status: DC | PRN
  Administered 2017-07-30 (×3): 20 mg via INTRAVENOUS
  Administered 2017-07-30: 30 mg via INTRAVENOUS

## 2017-07-30 MED ORDER — PROPOFOL 500 MG/50ML IV EMUL
INTRAVENOUS | Status: DC | PRN
  Administered 2017-07-30: 75 ug/kg/min via INTRAVENOUS

## 2017-07-30 MED ORDER — WARFARIN SODIUM 7.5 MG PO TABS: 7.5000 mg | ORAL_TABLET | ORAL | Status: DC

## 2017-07-30 MED ORDER — ONDANSETRON HCL 4 MG/2ML IJ SOLN
INTRAMUSCULAR | Status: DC | PRN
  Administered 2017-07-30: 4 mg via INTRAVENOUS

## 2017-07-30 SURGICAL SUPPLY — 21 items

## 2017-07-30 NOTE — Interval H&P Note (Signed)
History and Physical Interval Note:  07/30/2017 8:48 AM  Thomas [name]  has presented today for surgery, with the diagnosis of Screening for colon cancer  The various methods of treatment have been discussed with the patient and family. After consideration of risks, benefits and other options for treatment, the patient has consented to  Procedure(s): COLONOSCOPY WITH PROPOFOL (N/A) as a surgical intervention .  The patient's history has been reviewed, patient examined, no change in status, stable for surgery.  I have reviewed the patient's chart and labs.  Questions were answered to the patient's satisfaction.     [name]

## 2017-07-30 NOTE — Progress Notes (Signed)
ANTICOAGULATION CONSULT NOTEPharmacy Consult for Warfarin Indication: atrial fibrillation  No Known Allergies  Labs:  Recent Labs  07/27/17 1958 07/28/17 0439 07/29/17 0809 07/30/17 0330  HGB 8.6* 8.4*  --  8.2*  HCT 26.5* 25.1*  --  24.9*  PLT 211 204  --  244  LABPROT  --  13.9  --  14.2  INR  --  1.07  --  1.10  CREATININE  --   --  11.41*  --     Estimated Creatinine Clearance: 11.2 mL/min (A) (by C-G formula based on SCr of 11.41 mg/dL (H)).     Assessment: 51 yo male presents to ED with rectal bleeding/GI bleeding. Patient on PTA warfarin for Afib which was held on admission.   S/p colonoscopy   PTA regimen: 10mg /day except 7.5mg  SS  Goal of Therapy:  INR 2-3   Plan:  Repeat Warfarin 15 mg po x 1 now Resume home dose at discharge INR check on Friday  Thank you Anette Guarneri, PharmD [phone]  07/30/2017,11:52 AM

## 2017-07-30 NOTE — Care Management Note (Signed)
Case Management Note Marvetta Gibbons RN, BSN Unit 4E-Case Manager [phone]  Patient Details  Name: Sears Oran MRN: [id number] Date of Birth: [birth year]  Subjective/Objective:   Pt admitted with GIB                 Action/Plan: PTA pt lived at home- independent- plan to return home- no CM needs noted for discharge.  Expected Discharge Date:  07/30/17               Expected Discharge Plan:  Home/Self Care  In-House Referral:     Discharge planning Services  CM Consult, NA  Post Acute Care Choice:  NA Choice offered to:  NA  DME Arranged:    DME Agency:     HH Arranged:    HH Agency:     Status of Service:  Completed, signed off  If discussed at [place] of Stay Meetings, dates discussed:    Discharge Disposition: home/self care  Additional Comments:  Dawayne Patricia, RN 07/30/2017, 3:26 PM

## 2017-07-30 NOTE — Care Management Important Message (Signed)
Important Message  Patient Details  Name: Thomas [name] MRN: [id number] Date of Birth: 1966/04/15   Medicare Important Message Given:  Yes    Nathen May 07/30/2017, 12:25 PM

## 2017-07-30 NOTE — Anesthesia Postprocedure Evaluation (Signed)
Anesthesia Post Note  Patient: Thomas [name]  Procedure(s) Performed: Procedure(s) (LRB): COLONOSCOPY WITH PROPOFOL (N/A)     Patient location during evaluation: Endoscopy Anesthesia Type: MAC Level of consciousness: awake and alert, patient cooperative and oriented Pain management: pain level controlled Vital Signs Assessment: post-procedure vital signs reviewed and stable Respiratory status: spontaneous breathing, respiratory function stable and nonlabored ventilation Cardiovascular status: blood pressure returned to baseline and stable Postop Assessment: no signs of nausea or vomiting Anesthetic complications: no    Last Vitals:  Vitals:   07/30/17 1055 07/30/17 1105  BP: 127/83 134/71  Pulse: 76 72  Resp: 16 14  Temp:      Last Pain:  Vitals:   07/30/17 1034  TempSrc: Oral  PainSc:                  Jhoselin Crume,E. Brithney Bensen

## 2017-07-30 NOTE — Transfer of Care (Signed)
Immediate Anesthesia Transfer of Care Note  Patient: Thomas [name]  Procedure(s) Performed: Procedure(s): COLONOSCOPY WITH PROPOFOL (N/A)  Patient Location: PACU and Endoscopy Unit  Anesthesia Type:MAC  Level of Consciousness: drowsy, patient cooperative and responds to stimulation  Airway & Oxygen Therapy: Patient Spontanous Breathing and Patient connected to nasal cannula oxygen  Post-op Assessment: Report given to RN, Post -op Vital signs reviewed and stable and Patient moving all extremities  Post vital signs: Reviewed and stable  Last Vitals:  Vitals:   07/30/17 1034 07/30/17 1035  BP:  97/65  Pulse:  80  Resp:  19  Temp: 36.8 C     Last Pain:  Vitals:   07/30/17 1034  TempSrc: Oral  PainSc:          Complications: No apparent anesthesia complications

## 2017-07-30 NOTE — Anesthesia Preprocedure Evaluation (Addendum)
Anesthesia Evaluation  Patient identified by MRN, date of birth, ID band Patient awake    Reviewed: Allergy & Precautions, NPO status , Patient's Chart, lab work & pertinent test results, reviewed documented beta blocker date and time   History of Anesthesia Complications Negative for: history of anesthetic complications  Airway Mallampati: II  TM Distance: >3 FB Neck ROM: Full    Dental  (+) Dental Advisory Given   Pulmonary neg pulmonary ROS,    breath sounds clear to auscultation       Cardiovascular hypertension, Pt. on home beta blockers and Pt. on medications (-) angina+ dysrhythmias Atrial Fibrillation and Supra Ventricular Tachycardia  Rhythm:Regular Rate:Normal     Neuro/Psych negative neurological ROS  negative psych ROS   GI/Hepatic Neg liver ROS, GERD  Medicated and Controlled,  Endo/Other  diabetes (glu 133), Insulin Dependent  Renal/GU ESRF and DialysisRenal disease     Musculoskeletal   Abdominal (+) + obese,   Peds  Hematology  (+) Blood dyscrasia (Hb 8.2, coumadin), anemia , HIV,   Anesthesia Other Findings   Reproductive/Obstetrics                            Anesthesia Physical Anesthesia Plan  ASA: III  Anesthesia Plan: MAC   Post-op Pain Management:    Induction:   PONV Risk Score and Plan: 1 and Ondansetron and Treatment may vary due to age or medical condition  Airway Management Planned: Natural Airway and Nasal Cannula  Additional Equipment:   Intra-op Plan:   Post-operative Plan:   Informed Consent: I have reviewed the patients History and Physical, chart, labs and discussed the procedure including the risks, benefits and alternatives for the proposed anesthesia with the patient or authorized representative who has indicated his/her understanding and acceptance.   Dental advisory given  Plan Discussed with: CRNA and Surgeon  Anesthesia Plan  Comments: (Plan routine monitors, MAC)        Anesthesia Quick Evaluation

## 2017-07-30 NOTE — Discharge Summary (Signed)
Physician Discharge Summary  Thomas [name] MBW:[id number] DOB: 1966-07-07 DOA: 07/25/2017  PCP: Patient, No Pcp Per  Admit date: 07/25/2017 Discharge date: 07/30/2017  Time spent: > 35 minutes  Recommendations for Outpatient Follow-up:  1. Monitor hgb and INR levels 2. Coumadin restarted but aspirin held 3. Ensure patient f/u with Gastroenterology Dr. Cristina [name]   Discharge Diagnoses:  Principal Problem:   GIB (gastrointestinal bleeding) Active Problems:   ESRD on dialysis [facility])   Type 2 diabetes mellitus with complication ([place])   HIV disease ([place])   Anemia due to end stage renal disease ([facility])   PAF (paroxysmal atrial fibrillation) ([facility])   Symptomatic anemia   Discharge Condition: stable  Diet recommendation: Renal/Carb modified  Filed Weights   07/29/17 0743 07/29/17 1158 07/30/17 0851  Weight: 124.6 kg (274 lb 11.1 oz) 122.1 kg (269 lb 2.9 oz) 122.1 kg (269 lb 2.9 oz)    History of present illness:  51 y.o. male with medical history significant of atrial fibrillation on Coumadin, hypertension, diabetes mellitus, SVT, ESRD-HD, HIV, anemia due to ESRD, combined systolic and diastolic CHF, left BKA, who presents with rectal bleeding.  GI consulted and upon further evaluation with scope patient was found to have a [employer] Rx tear and findings suspicious for ischemic colitis.  Hospital Course:  Rectal bleeding - findings on endoscopy ( upper and lower) reporting Mallorie Weiss tear with no active bleed noted per my discussion with GI and findings on colonoscopy suspicious for ischemic colitis - GI evaluated and recommended the following: - Await pathology results. - Continue present medications. INR currently  normal; ok to continue coumadin dosing since it   will be days before his INR becomes therapeutic,  and because the current appearance is minimally  hemorrhagic. I think the patient is at low risk for  clinically significant bleeding from the area of  abnormality in  his colon, even once fully anticoagulated. - Resume previous diet. - Repeat colonoscopy date to be determined after   pending pathology results are reviewed for  surveillance based on pathology results.  - d/c on protonix 40 mg po bid. Please decide when to discontinue  HTN - decreased dose of amlodipine and metoprolol - discontinued lisinopril.  Atrial fibrillation - continue B blocker - Chadsvasc 2 or more - Continue coumadin  Procedures:  Endoscopy, upper and lower  Consultations:  GI: Buccini  Discharge Exam: Vitals:   07/30/17 1055 07/30/17 1105  BP: 127/83 134/71  Pulse: 76 72  Resp: 16 14  Temp:      General: Pt in nad, alert and awake Cardiovascular: rrr, no rubs Respiratory: no increased wob, no wheezes  Discharge Instructions   Discharge Instructions    Call MD for:  extreme fatigue    Complete by:  As directed    Call MD for:  temperature >100.4    Complete by:  As directed    Diet - low sodium heart healthy    Complete by:  As directed    Discharge instructions    Complete by:  As directed    Please be sure to reassess your INR levels. Follow up with your GI specialist for evaluation of biopsy results   Increase activity slowly    Complete by:  As directed      Current Discharge Medication List    START taking these medications   Details  pantoprazole (PROTONIX) 40 MG tablet Take 1 tablet (40 mg total) by mouth 2 (two) times daily. Qty: 30 tablet, Refills: 1  CONTINUE these medications which have CHANGED   Details  amLODipine (NORVASC) 5 MG tablet Take 1 tablet (5 mg total) by mouth daily. Qty: 30 tablet, Refills: 0    metoprolol tartrate (LOPRESSOR) 25 MG tablet Take 1 tablet (25 mg total) by mouth 2 (two) times daily. Qty: 60 tablet, Refills: 0      CONTINUE these medications which have NOT CHANGED   Details  cinacalcet (SENSIPAR) 60 MG tablet Take 60 mg by mouth daily.    doxercalciferol (HECTOROL) 4 MCG/2ML injection  Inject 2.5 mLs (5 mcg total) into the vein Every Tuesday,Thursday,and Saturday with dialysis. Qty: 2 mL    ferric gluconate 125 mg in sodium chloride 0.9 % 100 mL Inject 125 mg into the vein Every Tuesday,Thursday,and Saturday with dialysis.    ferrous sulfate 325 (65 FE) MG tablet Take 325 mg by mouth 3 (three) times daily with meals.    fosamprenavir (LEXIVA) 700 MG tablet Take 700 mg by mouth 2 (two) times daily.     insulin glargine (LANTUS) 100 UNIT/ML injection Inject 0.3 mLs (30 Units total) into the skin daily at 10 pm. Qty: 10 mL, Refills: 11    !! Multiple Vitamin (MULTIVITAMIN) tablet Take 1 tablet by mouth daily.    !! Multiple Vitamin (TAB-A-VITE PO) Take 1 tablet by mouth daily.    raltegravir (ISENTRESS) 400 MG tablet Take 400 mg by mouth 2 (two) times daily.    ritonavir (NORVIR) 100 MG capsule Take 100 mg by mouth daily with breakfast.    sevelamer carbonate (RENVELA) 800 MG tablet Take 800 mg by mouth 3 (three) times daily with meals.    !! warfarin (COUMADIN) 10 MG tablet Take 10 mg by mouth See admin instructions. Take 10 mg by mouth daily Monday through Friday.    !! warfarin (COUMADIN) 7.5 MG tablet Take 7.5 mg by mouth See admin instructions. Take 7.5 mg by mouth daily on Saturday and Sunday    insulin aspart (NOVOLOG) 100 UNIT/ML injection CBG 70 - 120: 0 units CBG 121 - 150: 3 units CBG 151 - 200: 4 units CBG 201 - 250: 7 units CBG 251 - 300: 11 units CBG 301 - 350: 15 units CBG 351 - 400: 20 units Qty: 10 mL, Refills: 11     !! - Potential duplicate medications found. Please discuss with provider.    STOP taking these medications     aspirin EC 81 MG tablet      lisinopril (PRINIVIL,ZESTRIL) 40 MG tablet        No Known Allergies    The results of significant diagnostics from this hospitalization (including imaging, microbiology, ancillary and laboratory) are listed below for reference.    Significant Diagnostic Studies: Ct Abdomen Pelvis  Wo Contrast  Result Date: 07/26/2017 CLINICAL DATA:  Gastrointestinal hemorrhage. EXAM: CT ABDOMEN AND PELVIS WITHOUT CONTRAST TECHNIQUE: Multidetector CT imaging of the abdomen and pelvis was performed following the standard protocol without IV contrast. COMPARISON:  None. FINDINGS: Lower chest: No acute abnormality. Hepatobiliary: No focal liver abnormality is seen. No gallstones, gallbladder wall thickening, or biliary dilatation. Pancreas: Unremarkable. No pancreatic ductal dilatation or surrounding inflammatory changes. Spleen: Normal in size without focal abnormality. Adrenals/Urinary Tract: Adrenal glands appear normal. Mild bilateral renal atrophy is noted consistent with history of end-stage renal disease. Multiple low densities are noted throughout both kidneys most consistent with cysts, but several appear to be complex in appearance, and further evaluation with ultrasound is recommended. No hydronephrosis or renal obstruction is  noted. No renal or ureteral calculi are noted. Urinary bladder is decompressed. Stomach/Bowel: Stomach is within normal limits. Appendix appears normal. No evidence of bowel wall thickening, distention, or inflammatory changes. Vascular/Lymphatic: Aortic atherosclerosis. No enlarged abdominal or pelvic lymph nodes. Reproductive: Prostate is unremarkable. Other: Small fat containing bilateral inguinal hernias are noted. No abnormal fluid collection is noted. Musculoskeletal: No acute or significant osseous findings. IMPRESSION: Aortic atherosclerosis. Mild bilateral renal atrophy is noted consistent with history of end-stage renal disease. Multiple low densities are noted throughout both kidneys most consistent with cysts, but several appear to be complex in appearance, and further evaluation with ultrasound is recommended to rule out mass. Small bilateral fat containing inguinal hernias. No other significant abnormality seen in the abdomen or pelvis. Electronically Signed   By:  Marijo Conception, M.D.   On: 07/26/2017 14:19    Microbiology: Recent Results (from the past 240 hour(s))  MRSA PCR Screening     Status: None   Collection Time: 07/26/17  6:42 AM  Result Value Ref Range Status   MRSA by PCR NEGATIVE NEGATIVE Final    Comment:        The GeneXpert MRSA Assay (FDA approved for NASAL specimens only), is one component of a comprehensive MRSA colonization surveillance program. It is not intended to diagnose MRSA infection nor to guide or monitor treatment for MRSA infections.      Labs: Basic Metabolic Panel:  Recent Labs Lab 07/25/17 2201 07/26/17 0605 07/29/17 0809 07/30/17 0946  NA 136 137 136 140  K 4.0 4.0 3.9 4.0  CL 95* 99* 101  --   CO2 26 26 22   --   GLUCOSE 203* 158* 201* 138*  BUN 41* 45* 49*  --   CREATININE 8.20* 8.80* 11.41*  --   CALCIUM 8.7* 8.2* 8.6*  --   PHOS  --   --  6.2*  --    Liver Function Tests:  Recent Labs Lab 07/25/17 2201 07/29/17 0809  AST 19  --   ALT 16*  --   ALKPHOS 103  --   BILITOT 0.5  --   PROT 6.7  --   ALBUMIN 3.4* 3.0*   No results for input(s): LIPASE, AMYLASE in the last 168 hours. No results for input(s): AMMONIA in the last 168 hours. CBC:  Recent Labs Lab 07/26/17 2050 07/27/17 0154 07/27/17 1958 07/28/17 0439 07/30/17 0330 07/30/17 0946  WBC 7.1 6.1 6.2 6.8 6.4  --   HGB 9.1* 8.3* 8.6* 8.4* 8.2* 7.8*  HCT 27.5* 24.7* 26.5* 25.1* 24.9* 23.0*  MCV 89.9 90.1 91.4 90.6 91.9  --   PLT 197 176 211 204 244  --    Cardiac Enzymes: No results for input(s): CKTOTAL, CKMB, CKMBINDEX, TROPONINI in the last 168 hours. BNP: BNP (last 3 results)  Recent Labs  05/05/17 1636  BNP 1,980.3*    ProBNP (last 3 results) No results for input(s): PROBNP in the last 8760 hours.  CBG:  Recent Labs Lab 07/29/17 1233 07/29/17 1619 07/29/17 2109 07/30/17 0606 07/30/17 1137  GLUCAP 122* 196* 217* 133* 114*    Signed:  Velvet Bathe MD.  [facility] 07/30/2017, 2:17  PM

## 2017-07-30 NOTE — Op Note (Signed)
[facility] Patient Name: Thomas [name] Procedure Date : 07/30/2017 MRN: [id number] Attending MD: [name] , MD Date of Birth: February 15, 1966 CSN: [id number] Age: 51 Admit Type: Inpatient Procedure:                Colonoscopy Indications:              This is the patient's first colonoscopy--Dialysis                            patient with HIV on coumadin with recent melena,                            Acute post hemorrhagic anemia; EGD showed resolving                            Mallow-Weiss tear Providers:                [name], MD, Cleda Daub, RN, Corliss Parish, Technician Referring MD:              Medicines:                Monitored Anesthesia Care Complications:            No immediate complications. Estimated Blood Loss:     Estimated blood loss was minimal. Procedure:                Pre-Anesthesia Assessment:                           - Prior to the procedure, a History and Physical                            was performed, and patient medications and                            allergies were reviewed. The patient's tolerance of                            previous anesthesia was also reviewed. The risks                            and benefits of the procedure and the sedation                            options and risks were discussed with the patient.                            All questions were answered, and informed consent                            was obtained. Prior Anticoagulants: The patient has                            taken Coumadin (  warfarin), last dose was 5 days                            prior to procedure. ASA Grade Assessment: III - A                            patient with severe systemic disease. After                            reviewing the risks and benefits, the patient was                            deemed in satisfactory condition to undergo the                            procedure.               After obtaining informed consent, the colonoscope                            was passed under direct vision. Throughout the                            procedure, the patient's blood pressure, pulse, and                            oxygen saturations were monitored continuously. The                            EC-[id number] ([id number]) scope was introduced through                            the anus and advanced to the the terminal ileum.                            The colonoscopy was performed without difficulty.                            The patient tolerated the procedure well. The                            quality of the bowel preparation was good. The                            terminal ileum, ileocecal valve, appendiceal                            orifice, and rectum were photographed. Scope In: 10:08:36 AM Scope Out: 10:25:18 AM Scope Withdrawal Time: 0 hours 12 minutes 56 seconds  Total Procedure Duration: 0 hours 16 minutes 42 seconds  Findings:      The perianal examination was normal.      The digital rectal exam was normal. Pertinent negatives include normal       prostate (size, shape, and consistency).      A segmental  area of colitis, characterized by moderately congested,       erythematous, eroded, ulcerated and hemorrhagic mucosa was found in the       proximal ascending colon and on the roof of the ileocecal valve. Total       extent of inflammation was about 4-6 cm. Biopsies were taken with a cold       forceps for histology. Estimated blood loss was minimal.      The exam was otherwise normal throughout the examined colon. There was       no blood in the colonic lumen.      The retroflexed view of the distal rectum and anal verge was normal and       showed no anal or rectal abnormalities. Impression:               - Congested, erythematous, eroded and hemorrhagic                            mucosa in the proximal ascending colon and at the                             ileocecal valve. Biopsied. Appearance suggestive of                            resolving ischemic colitis.                           - The distal rectum and anal verge are normal on                            retroflexion view. Moderate Sedation:      This patient was sedated with monitored anesthesia care, not moderate       sedation. Recommendation:           - Await pathology results.                           - Continue present medications. INR currently                            normal; ok to continue coumadin dosing since it                            will be days before his INR becomes therapeutic,                            and because the current appearance is minimally                            hemorrhagic. I think the patient is at low risk for                            clinically significant bleeding from the area of                            abnormality in his colon, even once fully  anticoagulated.                           - Resume previous diet.                           - Repeat colonoscopy date to be determined after                            pending pathology results are reviewed for                            surveillance based on pathology results. Procedure Code(s):        --- Professional ---                           [id number], Colonoscopy, flexible; with biopsy, single                            or multiple Diagnosis Code(s):        --- Professional ---                           K63.89, Other specified diseases of intestine                           K92.2, Gastrointestinal hemorrhage, unspecified                           K92.1, Melena (includes Hematochezia)                           D62, Acute posthemorrhagic anemia CPT copyright 2016 American Medical Association. All rights reserved. The codes documented in this report are preliminary and upon coder review may  be revised to meet current compliance requirements. [name], MD 07/30/2017 10:50:27 AM This report has been signed electronically. Number of Addenda: 0

## 2017-08-02 ENCOUNTER — Encounter (HOSPITAL_COMMUNITY): Payer: Self-pay | Admitting: Gastroenterology

## 2017-08-07 ENCOUNTER — Telehealth: Payer: Self-pay | Admitting: Cardiology

## 2017-08-07 NOTE — Telephone Encounter (Signed)
Left message for Janett Billow that patient reported his PCP was Karolee Ohs. Instructed her to call with further questions.

## 2017-08-07 NOTE — Telephone Encounter (Signed)
New message    Janett Billow from [facility], Dr. Otila Kluver Garrett's office. She is calling to ask RN if pt has a pcp, She needs to fax over INR results.

## 2017-09-03 ENCOUNTER — Ambulatory Visit (INDEPENDENT_AMBULATORY_CARE_PROVIDER_SITE_OTHER): Payer: Medicare Other

## 2017-09-03 ENCOUNTER — Inpatient Hospital Stay (HOSPITAL_COMMUNITY)
Admission: AD | Admit: 2017-09-03 | Discharge: 2017-09-05 | DRG: 264 | Disposition: A | Discharge status: Home Health | Payer: Medicare Other | Source: Ambulatory Visit | Attending: Family Medicine | Admitting: Family Medicine

## 2017-09-03 ENCOUNTER — Ambulatory Visit (INDEPENDENT_AMBULATORY_CARE_PROVIDER_SITE_OTHER): Payer: Medicare Other | Admitting: Podiatry

## 2017-09-03 ENCOUNTER — Encounter (HOSPITAL_COMMUNITY): Payer: Self-pay

## 2017-09-03 ENCOUNTER — Encounter: Payer: Self-pay | Admitting: Podiatry

## 2017-09-03 VITALS — BP 115/65 | HR 90 | Resp 16

## 2017-09-03 DIAGNOSIS — E1165 Type 2 diabetes mellitus with hyperglycemia: Secondary | ICD-10-CM | POA: Diagnosis not present

## 2017-09-03 DIAGNOSIS — K219 Gastro-esophageal reflux disease without esophagitis: Secondary | ICD-10-CM | POA: Diagnosis present

## 2017-09-03 DIAGNOSIS — E118 Type 2 diabetes mellitus with unspecified complications: Secondary | ICD-10-CM | POA: Diagnosis not present

## 2017-09-03 DIAGNOSIS — I5042 Chronic combined systolic (congestive) and diastolic (congestive) heart failure: Secondary | ICD-10-CM | POA: Diagnosis present

## 2017-09-03 DIAGNOSIS — E876 Hypokalemia: Secondary | ICD-10-CM | POA: Diagnosis present

## 2017-09-03 DIAGNOSIS — D631 Anemia in chronic kidney disease: Secondary | ICD-10-CM | POA: Diagnosis not present

## 2017-09-03 DIAGNOSIS — Z833 Family history of diabetes mellitus: Secondary | ICD-10-CM

## 2017-09-03 DIAGNOSIS — Z794 Long term (current) use of insulin: Secondary | ICD-10-CM

## 2017-09-03 DIAGNOSIS — I480 Paroxysmal atrial fibrillation: Secondary | ICD-10-CM | POA: Diagnosis present

## 2017-09-03 DIAGNOSIS — L97419 Non-pressure chronic ulcer of right heel and midfoot with unspecified severity: Secondary | ICD-10-CM | POA: Diagnosis not present

## 2017-09-03 DIAGNOSIS — I10 Essential (primary) hypertension: Secondary | ICD-10-CM | POA: Diagnosis not present

## 2017-09-03 DIAGNOSIS — Z6831 Body mass index (BMI) 31.0-31.9, adult: Secondary | ICD-10-CM

## 2017-09-03 DIAGNOSIS — E11621 Type 2 diabetes mellitus with foot ulcer: Secondary | ICD-10-CM | POA: Diagnosis not present

## 2017-09-03 DIAGNOSIS — Z992 Dependence on renal dialysis: Secondary | ICD-10-CM | POA: Diagnosis not present

## 2017-09-03 DIAGNOSIS — N186 End stage renal disease: Secondary | ICD-10-CM | POA: Diagnosis not present

## 2017-09-03 DIAGNOSIS — E1122 Type 2 diabetes mellitus with diabetic chronic kidney disease: Secondary | ICD-10-CM | POA: Diagnosis present

## 2017-09-03 DIAGNOSIS — N2581 Secondary hyperparathyroidism of renal origin: Secondary | ICD-10-CM | POA: Diagnosis present

## 2017-09-03 DIAGNOSIS — I132 Hypertensive heart and chronic kidney disease with heart failure and with stage 5 chronic kidney disease, or end stage renal disease: Secondary | ICD-10-CM | POA: Diagnosis present

## 2017-09-03 DIAGNOSIS — E8889 Other specified metabolic disorders: Secondary | ICD-10-CM | POA: Diagnosis present

## 2017-09-03 DIAGNOSIS — L97509 Non-pressure chronic ulcer of other part of unspecified foot with unspecified severity: Secondary | ICD-10-CM

## 2017-09-03 DIAGNOSIS — Z7901 Long term (current) use of anticoagulants: Secondary | ICD-10-CM

## 2017-09-03 DIAGNOSIS — E1121 Type 2 diabetes mellitus with diabetic nephropathy: Secondary | ICD-10-CM | POA: Diagnosis present

## 2017-09-03 DIAGNOSIS — Z89512 Acquired absence of left leg below knee: Secondary | ICD-10-CM

## 2017-09-03 DIAGNOSIS — B20 Human immunodeficiency virus [HIV] disease: Secondary | ICD-10-CM | POA: Diagnosis not present

## 2017-09-03 DIAGNOSIS — Z79899 Other long term (current) drug therapy: Secondary | ICD-10-CM

## 2017-09-03 DIAGNOSIS — E669 Obesity, unspecified: Secondary | ICD-10-CM | POA: Diagnosis present

## 2017-09-03 DIAGNOSIS — E1152 Type 2 diabetes mellitus with diabetic peripheral angiopathy with gangrene: Principal | ICD-10-CM

## 2017-09-03 LAB — COMPREHENSIVE METABOLIC PANEL
ALBUMIN: 3.3 g/dL — AB (ref 3.5–5.0)
ALK PHOS: 92 U/L (ref 38–126)
ALT: 14 U/L — AB (ref 17–63)
ANION GAP: 18 — AB (ref 5–15)
AST: 16 U/L (ref 15–41)
BUN: 44 mg/dL — ABNORMAL HIGH (ref 6–20)
CALCIUM: 10.3 mg/dL (ref 8.9–10.3)
CHLORIDE: 93 mmol/L — AB (ref 101–111)
CO2: 27 mmol/L (ref 22–32)
Creatinine, Ser: 10.01 mg/dL — ABNORMAL HIGH (ref 0.61–1.24)
GFR calc Af Amer: 6 mL/min — ABNORMAL LOW (ref >60)
GFR calc non Af Amer: 5 mL/min — ABNORMAL LOW (ref >60)
GLUCOSE: 318 mg/dL — AB (ref 65–99)
Potassium: 3.4 mmol/L — ABNORMAL LOW (ref 3.5–5.1)
SODIUM: 138 mmol/L (ref 135–145)
Total Bilirubin: 0.6 mg/dL (ref 0.3–1.2)
Total Protein: 7.6 g/dL (ref 6.5–8.1)

## 2017-09-03 LAB — CBC WITH DIFFERENTIAL/PLATELET
BASOS ABS: 0 10*3/uL (ref 0.0–0.1)
BASOS PCT: 1 %
EOS ABS: 0.3 10*3/uL (ref 0.0–0.7)
Eosinophils Relative: 3 %
HCT: 34 % — ABNORMAL LOW (ref 39.0–52.0)
HEMOGLOBIN: 10.8 g/dL — AB (ref 13.0–17.0)
LYMPHS ABS: 1.2 10*3/uL (ref 0.7–4.0)
Lymphocytes Relative: 14 %
MCH: 31.2 pg (ref 26.0–34.0)
MCHC: 31.8 g/dL (ref 30.0–36.0)
MCV: 98.3 fL (ref 78.0–100.0)
Monocytes Absolute: 0.8 10*3/uL (ref 0.1–1.0)
Monocytes Relative: 9 %
NEUTROS PCT: 73 %
Neutro Abs: 6.4 10*3/uL (ref 1.7–7.7)
Platelets: 242 10*3/uL (ref 150–400)
RBC: 3.46 MIL/uL — AB (ref 4.22–5.81)
RDW: 18.9 % — ABNORMAL HIGH (ref 11.5–15.5)
WBC: 8.7 10*3/uL (ref 4.0–10.5)

## 2017-09-03 LAB — PROTIME-INR
INR: 2.26
Prothrombin Time: 24.8 seconds — ABNORMAL HIGH (ref 11.4–15.2)

## 2017-09-03 LAB — SEDIMENTATION RATE: Sed Rate: 81 mm/hr — ABNORMAL HIGH (ref 0–16)

## 2017-09-03 LAB — PREALBUMIN: PREALBUMIN: 31.3 mg/dL (ref 18–38)

## 2017-09-03 LAB — C-REACTIVE PROTEIN: CRP: 4.6 mg/dL — AB (ref <1.0)

## 2017-09-03 MED ORDER — SODIUM CHLORIDE 0.9 % IV SOLN
125.0000 mg | INTRAVENOUS | Status: DC
  Filled 2017-09-03: qty 10

## 2017-09-03 MED ORDER — INSULIN GLARGINE 100 UNIT/ML ~~LOC~~ SOLN
20.0000 [IU] | Freq: Every day | SUBCUTANEOUS | Status: DC
  Administered 2017-09-04 (×2): 20 [IU] via SUBCUTANEOUS
  Filled 2017-09-03 (×3): qty 0.2

## 2017-09-03 MED ORDER — PANTOPRAZOLE SODIUM 40 MG PO TBEC
40.0000 mg | DELAYED_RELEASE_TABLET | Freq: Two times a day (BID) | ORAL | Status: DC
  Administered 2017-09-04 – 2017-09-05 (×4): 40 mg via ORAL
  Filled 2017-09-03 (×4): qty 1

## 2017-09-03 MED ORDER — FERROUS SULFATE 325 (65 FE) MG PO TABS
325.0000 mg | ORAL_TABLET | Freq: Three times a day (TID) | ORAL | Status: DC
  Administered 2017-09-04: 325 mg via ORAL
  Filled 2017-09-03: qty 1

## 2017-09-03 MED ORDER — ACETAMINOPHEN 650 MG RE SUPP: 650.0000 mg | Freq: Four times a day (QID) | RECTAL | Status: DC | PRN

## 2017-09-03 MED ORDER — INSULIN ASPART 100 UNIT/ML ~~LOC~~ SOLN
0.0000 [IU] | Freq: Three times a day (TID) | SUBCUTANEOUS | Status: DC
  Administered 2017-09-04: 2 [IU] via SUBCUTANEOUS
  Administered 2017-09-05: 5 [IU] via SUBCUTANEOUS
  Administered 2017-09-05: 3 [IU] via SUBCUTANEOUS

## 2017-09-03 MED ORDER — ONDANSETRON HCL 4 MG PO TABS: 4.0000 mg | ORAL_TABLET | Freq: Four times a day (QID) | ORAL | Status: DC | PRN

## 2017-09-03 MED ORDER — RENA-VITE PO TABS
1.0000 | ORAL_TABLET | Freq: Every day | ORAL | Status: DC
  Administered 2017-09-04 (×2): 1 via ORAL
  Filled 2017-09-03 (×2): qty 1

## 2017-09-03 MED ORDER — ONDANSETRON HCL 4 MG/2ML IJ SOLN: 4.0000 mg | Freq: Four times a day (QID) | INTRAMUSCULAR | Status: DC | PRN

## 2017-09-03 MED ORDER — HYDRALAZINE HCL 20 MG/ML IJ SOLN: 5.0000 mg | INTRAMUSCULAR | Status: DC | PRN

## 2017-09-03 MED ORDER — METOPROLOL TARTRATE 25 MG PO TABS
25.0000 mg | ORAL_TABLET | Freq: Two times a day (BID) | ORAL | Status: DC
  Administered 2017-09-04 – 2017-09-05 (×4): 25 mg via ORAL
  Filled 2017-09-03 (×4): qty 1

## 2017-09-03 MED ORDER — SEVELAMER CARBONATE 800 MG PO TABS
800.0000 mg | ORAL_TABLET | Freq: Three times a day (TID) | ORAL | Status: DC
  Administered 2017-09-05 (×2): 800 mg via ORAL
  Filled 2017-09-03 (×2): qty 1

## 2017-09-03 MED ORDER — FOSAMPRENAVIR CALCIUM 700 MG PO TABS
700.0000 mg | ORAL_TABLET | Freq: Two times a day (BID) | ORAL | Status: DC
  Filled 2017-09-03 (×2): qty 1

## 2017-09-03 MED ORDER — RALTEGRAVIR POTASSIUM 400 MG PO TABS
400.0000 mg | ORAL_TABLET | Freq: Two times a day (BID) | ORAL | Status: DC
  Administered 2017-09-04 – 2017-09-05 (×4): 400 mg via ORAL
  Filled 2017-09-03 (×5): qty 1

## 2017-09-03 MED ORDER — RITONAVIR 100 MG PO CAPS
100.0000 mg | ORAL_CAPSULE | Freq: Every day | ORAL | Status: DC
  Administered 2017-09-04 – 2017-09-05 (×2): 100 mg via ORAL
  Filled 2017-09-03 (×4): qty 1

## 2017-09-03 MED ORDER — ADULT MULTIVITAMIN W/MINERALS CH
1.0000 | ORAL_TABLET | Freq: Every day | ORAL | Status: DC
  Administered 2017-09-04 – 2017-09-05 (×2): 1 via ORAL
  Filled 2017-09-03 (×2): qty 1

## 2017-09-03 MED ORDER — AMLODIPINE BESYLATE 5 MG PO TABS
5.0000 mg | ORAL_TABLET | Freq: Every day | ORAL | Status: DC
  Administered 2017-09-04 – 2017-09-05 (×2): 5 mg via ORAL
  Filled 2017-09-03 (×2): qty 1

## 2017-09-03 MED ORDER — CINACALCET HCL 30 MG PO TABS
60.0000 mg | ORAL_TABLET | Freq: Every day | ORAL | Status: DC
  Administered 2017-09-04 – 2017-09-05 (×2): 60 mg via ORAL
  Filled 2017-09-03 (×2): qty 2

## 2017-09-03 MED ORDER — ACETAMINOPHEN 325 MG PO TABS: 650.0000 mg | ORAL_TABLET | Freq: Four times a day (QID) | ORAL | Status: DC | PRN

## 2017-09-03 MED ORDER — ZOLPIDEM TARTRATE 5 MG PO TABS: 5.0000 mg | ORAL_TABLET | Freq: Every evening | ORAL | Status: DC | PRN

## 2017-09-03 NOTE — Progress Notes (Signed)
Pt arrived floor in the presence of family. [facility] paged about pt's arrival. Pt AxOx4. Vital signs completed. Assessment completed. Will continue to monitor.

## 2017-09-03 NOTE — Progress Notes (Signed)
Subjective:    Patient ID: Thomas [name], male    DOB: [birth year], 51 y.o.   MRN: [id number]  HPI Chief Complaint  Patient presents with  . Wound Check    Right foot; bottom  & side of heel; pt stated, "Has had for the past 2 weeks; and has used perioxide to clean"; Pt Diabetic Type 2; Sugar=did not check today; A1C=11.7 (05/05/17)   51 y.o. male presents with the above complaint. Has hx of L BKA, uncontrolled DM, on HD. Reports 2 week hx of ulcer to his R heel. Believes it started from pressure. Denies N/V/F/Ch.   Past Medical History:  Diagnosis Date  . Abdominal pain   . Anemia   . Diabetes ([place])   . ESRD (end stage renal disease) ([place])   . ESRD on dialysis ([place]) 03/13/2017  . Fatigue   . HIV disease ([place])   . Hyperparathyroidism, secondary ([place])   . Hypertension   . Hypertensive heart disease with end stage renal disease on dialysis ([place]) 03/13/2017  . Pulmonary congestion   . SOB (shortness of breath)   . SVT (supraventricular tachycardia) ([place])    ? afib or atrial flutter s/p TEE/DCCV with subsequent ablation due to reoccurrence in [place]   Past Surgical History:  Procedure Laterality Date  . AMPUTATION Left    foot  . COLONOSCOPY WITH PROPOFOL N/A 07/30/2017   Procedure: COLONOSCOPY WITH PROPOFOL;  Surgeon: [name], MD;  Location: [facility];  Service: Endoscopy;  Laterality: N/A;  . ESOPHAGOGASTRODUODENOSCOPY (EGD) WITH PROPOFOL N/A 07/28/2017   Procedure: ESOPHAGOGASTRODUODENOSCOPY (EGD) WITH PROPOFOL;  Surgeon: [name], MD;  Location: [facility];  Service: Endoscopy;  Laterality: N/A;  . FLEXIBLE SIGMOIDOSCOPY N/A 07/27/2017   Procedure: FLEXIBLE SIGMOIDOSCOPY;  Surgeon: [name], MD;  Location[facility] ENDOSCOPY;  Service: Endoscopy;  Laterality: N/A;  . RIGHT AV FISTULA PLACEMENT  10/19/2012    Current Outpatient Prescriptions:  .  amLODipine (NORVASC) 5 MG tablet, Take 1 tablet (5 mg total) by mouth daily., Disp: 30 tablet, Rfl: 0 .  cinacalcet  (SENSIPAR) 60 MG tablet, Take 60 mg by mouth daily., Disp: , Rfl:  .  doxercalciferol (HECTOROL) 4 MCG/2ML injection, Inject 2.5 mLs (5 mcg total) into the vein Every Tuesday,Thursday,and Saturday with dialysis., Disp: 2 mL, Rfl:  .  ferric gluconate 125 mg in sodium chloride 0.9 % 100 mL, Inject 125 mg into the vein Every Tuesday,Thursday,and Saturday with dialysis., Disp: , Rfl:  .  ferrous sulfate 325 (65 FE) MG tablet, Take 325 mg by mouth 3 (three) times daily with meals., Disp: , Rfl:  .  fosamprenavir (LEXIVA) 700 MG tablet, Take 700 mg by mouth 2 (two) times daily. , Disp: , Rfl:  .  insulin aspart (NOVOLOG) 100 UNIT/ML injection, CBG 70 - 120: 0 units CBG 121 - 150: 3 units CBG 151 - 200: 4 units CBG 201 - 250: 7 units CBG 251 - 300: 11 units CBG 301 - 350: 15 units CBG 351 - 400: 20 units, Disp: 10 mL, Rfl: 11 .  insulin glargine (LANTUS) 100 UNIT/ML injection, Inject 0.3 mLs (30 Units total) into the skin daily at 10 pm., Disp: 10 mL, Rfl: 11 .  metoprolol tartrate (LOPRESSOR) 25 MG tablet, Take 1 tablet (25 mg total) by mouth 2 (two) times daily., Disp: 60 tablet, Rfl: 0 .  Multiple Vitamin (MULTIVITAMIN) tablet, Take 1 tablet by mouth daily., Disp: , Rfl:  .  Multiple Vitamin (TAB-A-VITE PO), Take 1 tablet by mouth  daily., Disp: , Rfl:  .  raltegravir (ISENTRESS) 400 MG tablet, Take 400 mg by mouth 2 (two) times daily., Disp: , Rfl:  .  ritonavir (NORVIR) 100 MG capsule, Take 100 mg by mouth daily with breakfast., Disp: , Rfl:  .  sevelamer carbonate (RENVELA) 800 MG tablet, Take 800 mg by mouth 3 (three) times daily with meals., Disp: , Rfl:  .  warfarin (COUMADIN) 10 MG tablet, Take 10 mg by mouth See admin instructions. Take 10 mg by mouth daily Monday through Friday., Disp: , Rfl:  .  warfarin (COUMADIN) 7.5 MG tablet, Take 7.5 mg by mouth See admin instructions. Take 7.5 mg by mouth daily on Saturday and Sunday, Disp: , Rfl:  .  pantoprazole (PROTONIX) 40 MG tablet, Take 1 tablet (40  mg total) by mouth 2 (two) times daily., Disp: 30 tablet, Rfl: 1  No Known Allergies   Review of Systems  All other systems reviewed and are negative.      Objective:   Physical Exam Vitals:   09/03/17 1633  BP: 115/65  Pulse: 90  Resp: 16   General AA&O x3. Normal mood and affect.  Vascular Dorsalis pedis and posterior tibial pulses  absent right  Capillary refill delayed ~ 5 seconds to all R digits. Pedal hair growth absent.  Neurologic Epicritic sensation grossly absent.  Dermatologic (Wound) Wound Location: R medial heel Wound Measurement: 6x6 Wound Base: Necrotic eschar, boggy Peri-wound: Calloused Exudate: none  Orthopedic: L BKA.    Radiographs: Taken and reviewed. No osseous erosions present. No acute fractures or dislocations. Severe calcifications noted.   Assessment & Plan:  Patient was evaluated and treated and all questions answered.  Wet Gangrene of R Heel -XR reviewed as above. -Wound does not appear to probe to bone. Local wet gangrene but no peri-wound infection. -Discussed with patient that he would benefit from admission and debridement of his R heel wound. Will coordinate direct admission to the hospital under the hospitalist service. -Wound dressed with betadine and DSD.   40 minutes of face to face time were spent with the patient. >50% of this was spent on counseling and coordination of care. Specifically discussed with patient high possibility of future amputation due to multiple medical issues. Coordinated direct admission to the hospital under the hospitalist service. Will plan for operative debridement 09/04/17.

## 2017-09-04 ENCOUNTER — Inpatient Hospital Stay (HOSPITAL_COMMUNITY): Admission: RE | Admit: 2017-09-04 | Payer: Medicare Other | Source: Ambulatory Visit | Admitting: Podiatry

## 2017-09-04 ENCOUNTER — Encounter (HOSPITAL_COMMUNITY): Payer: Medicare Other

## 2017-09-04 ENCOUNTER — Encounter (HOSPITAL_COMMUNITY)
Admission: AD | Disposition: A | Discharge status: Home Health | Payer: Self-pay | Source: Ambulatory Visit | Attending: Family Medicine

## 2017-09-04 ENCOUNTER — Telehealth: Payer: Self-pay | Admitting: Podiatry

## 2017-09-04 ENCOUNTER — Observation Stay (HOSPITAL_COMMUNITY): Payer: Medicare Other | Admitting: Certified Registered Nurse Anesthetist

## 2017-09-04 ENCOUNTER — Encounter: Payer: Self-pay | Admitting: Podiatry

## 2017-09-04 DIAGNOSIS — B20 Human immunodeficiency virus [HIV] disease: Secondary | ICD-10-CM | POA: Diagnosis present

## 2017-09-04 DIAGNOSIS — E1121 Type 2 diabetes mellitus with diabetic nephropathy: Secondary | ICD-10-CM | POA: Diagnosis present

## 2017-09-04 DIAGNOSIS — D631 Anemia in chronic kidney disease: Secondary | ICD-10-CM | POA: Diagnosis present

## 2017-09-04 DIAGNOSIS — L97419 Non-pressure chronic ulcer of right heel and midfoot with unspecified severity: Secondary | ICD-10-CM | POA: Diagnosis present

## 2017-09-04 DIAGNOSIS — I480 Paroxysmal atrial fibrillation: Secondary | ICD-10-CM | POA: Diagnosis present

## 2017-09-04 DIAGNOSIS — E8889 Other specified metabolic disorders: Secondary | ICD-10-CM | POA: Diagnosis present

## 2017-09-04 DIAGNOSIS — E11621 Type 2 diabetes mellitus with foot ulcer: Secondary | ICD-10-CM | POA: Diagnosis present

## 2017-09-04 DIAGNOSIS — Z7901 Long term (current) use of anticoagulants: Secondary | ICD-10-CM | POA: Diagnosis not present

## 2017-09-04 DIAGNOSIS — Z992 Dependence on renal dialysis: Secondary | ICD-10-CM | POA: Diagnosis not present

## 2017-09-04 DIAGNOSIS — I5042 Chronic combined systolic (congestive) and diastolic (congestive) heart failure: Secondary | ICD-10-CM | POA: Diagnosis present

## 2017-09-04 DIAGNOSIS — Z79899 Other long term (current) drug therapy: Secondary | ICD-10-CM | POA: Diagnosis not present

## 2017-09-04 DIAGNOSIS — E876 Hypokalemia: Secondary | ICD-10-CM | POA: Diagnosis present

## 2017-09-04 DIAGNOSIS — K219 Gastro-esophageal reflux disease without esophagitis: Secondary | ICD-10-CM | POA: Diagnosis present

## 2017-09-04 DIAGNOSIS — I10 Essential (primary) hypertension: Secondary | ICD-10-CM

## 2017-09-04 DIAGNOSIS — E1152 Type 2 diabetes mellitus with diabetic peripheral angiopathy with gangrene: Secondary | ICD-10-CM

## 2017-09-04 DIAGNOSIS — N186 End stage renal disease: Secondary | ICD-10-CM | POA: Diagnosis present

## 2017-09-04 DIAGNOSIS — Z794 Long term (current) use of insulin: Secondary | ICD-10-CM | POA: Diagnosis not present

## 2017-09-04 DIAGNOSIS — E669 Obesity, unspecified: Secondary | ICD-10-CM | POA: Diagnosis present

## 2017-09-04 DIAGNOSIS — E1165 Type 2 diabetes mellitus with hyperglycemia: Secondary | ICD-10-CM | POA: Diagnosis not present

## 2017-09-04 DIAGNOSIS — N2581 Secondary hyperparathyroidism of renal origin: Secondary | ICD-10-CM | POA: Diagnosis present

## 2017-09-04 DIAGNOSIS — Z89512 Acquired absence of left leg below knee: Secondary | ICD-10-CM | POA: Diagnosis not present

## 2017-09-04 DIAGNOSIS — E118 Type 2 diabetes mellitus with unspecified complications: Secondary | ICD-10-CM

## 2017-09-04 DIAGNOSIS — Z6831 Body mass index (BMI) 31.0-31.9, adult: Secondary | ICD-10-CM | POA: Diagnosis not present

## 2017-09-04 DIAGNOSIS — E1122 Type 2 diabetes mellitus with diabetic chronic kidney disease: Secondary | ICD-10-CM | POA: Diagnosis present

## 2017-09-04 DIAGNOSIS — I96 Gangrene, not elsewhere classified: Secondary | ICD-10-CM | POA: Diagnosis not present

## 2017-09-04 DIAGNOSIS — Z833 Family history of diabetes mellitus: Secondary | ICD-10-CM | POA: Diagnosis not present

## 2017-09-04 DIAGNOSIS — I132 Hypertensive heart and chronic kidney disease with heart failure and with stage 5 chronic kidney disease, or end stage renal disease: Secondary | ICD-10-CM | POA: Diagnosis present

## 2017-09-04 HISTORY — PX: I & D EXTREMITY: SHX5045

## 2017-09-04 HISTORY — PX: APPLICATION OF WOUND VAC: SHX5189

## 2017-09-04 HISTORY — DX: Type 2 diabetes mellitus with diabetic peripheral angiopathy with gangrene: E11.52

## 2017-09-04 LAB — CBC
HCT: 32.8 % — ABNORMAL LOW (ref 39.0–52.0)
Hemoglobin: 10.9 g/dL — ABNORMAL LOW (ref 13.0–17.0)
MCH: 32.1 pg (ref 26.0–34.0)
MCHC: 33.2 g/dL (ref 30.0–36.0)
MCV: 96.5 fL (ref 78.0–100.0)
PLATELETS: 205 10*3/uL (ref 150–400)
RBC: 3.4 MIL/uL — AB (ref 4.22–5.81)
RDW: 19.1 % — ABNORMAL HIGH (ref 11.5–15.5)
WBC: 8.5 10*3/uL (ref 4.0–10.5)

## 2017-09-04 LAB — BASIC METABOLIC PANEL
Anion gap: 16 — ABNORMAL HIGH (ref 5–15)
BUN: 48 mg/dL — ABNORMAL HIGH (ref 6–20)
CHLORIDE: 95 mmol/L — AB (ref 101–111)
CO2: 28 mmol/L (ref 22–32)
CREATININE: 10.69 mg/dL — AB (ref 0.61–1.24)
Calcium: 10.1 mg/dL (ref 8.9–10.3)
GFR calc non Af Amer: 5 mL/min — ABNORMAL LOW (ref >60)
GFR, EST AFRICAN AMERICAN: 6 mL/min — AB (ref >60)
Glucose, Bld: 157 mg/dL — ABNORMAL HIGH (ref 65–99)
Potassium: 3.4 mmol/L — ABNORMAL LOW (ref 3.5–5.1)
SODIUM: 139 mmol/L (ref 135–145)

## 2017-09-04 LAB — SURGICAL PCR SCREEN
MRSA, PCR: NEGATIVE
Staphylococcus aureus: POSITIVE — AB

## 2017-09-04 LAB — GLUCOSE, CAPILLARY
GLUCOSE-CAPILLARY: 158 mg/dL — AB (ref 65–99)
GLUCOSE-CAPILLARY: 161 mg/dL — AB (ref 65–99)
GLUCOSE-CAPILLARY: 104 mg/dL — AB (ref 65–99)
Glucose-Capillary: 163 mg/dL — ABNORMAL HIGH (ref 65–99)

## 2017-09-04 SURGERY — IRRIGATION AND DEBRIDEMENT EXTREMITY
Anesthesia: General | Site: Foot | Laterality: Right

## 2017-09-04 MED ORDER — PRO-STAT SUGAR FREE PO LIQD
30.0000 mL | Freq: Every day | ORAL | Status: DC
  Administered 2017-09-05: 30 mL via ORAL
  Filled 2017-09-04: qty 30

## 2017-09-04 MED ORDER — SODIUM CHLORIDE 0.9 % IV SOLN
INTRAVENOUS | Status: DC
  Administered 2017-09-04: 12:00:00 via INTRAVENOUS

## 2017-09-04 MED ORDER — HYDROMORPHONE HCL 1 MG/ML IJ SOLN: 0.2500 mg | INTRAMUSCULAR | Status: DC | PRN

## 2017-09-04 MED ORDER — ONDANSETRON HCL 4 MG/2ML IJ SOLN
INTRAMUSCULAR | Status: DC | PRN
  Administered 2017-09-04: 4 mg via INTRAVENOUS

## 2017-09-04 MED ORDER — BUPIVACAINE HCL (PF) 0.5 % IJ SOLN
INTRAMUSCULAR | Status: DC | PRN
  Administered 2017-09-04: 50 mL

## 2017-09-04 MED ORDER — EPHEDRINE SULFATE-NACL 50-0.9 MG/10ML-% IV SOSY
PREFILLED_SYRINGE | INTRAVENOUS | Status: DC | PRN
  Administered 2017-09-04 (×2): 10 mg via INTRAVENOUS

## 2017-09-04 MED ORDER — CHLORHEXIDINE GLUCONATE CLOTH 2 % EX PADS
6.0000 | MEDICATED_PAD | Freq: Every day | CUTANEOUS | Status: DC
  Administered 2017-09-04 – 2017-09-05 (×2): 6 via TOPICAL

## 2017-09-04 MED ORDER — OXYCODONE HCL 5 MG PO TABS
10.0000 mg | ORAL_TABLET | Freq: Once | ORAL | Status: AC
  Administered 2017-09-04: 10 mg via ORAL

## 2017-09-04 MED ORDER — MIDAZOLAM HCL 2 MG/2ML IJ SOLN
INTRAMUSCULAR | Status: DC | PRN
  Administered 2017-09-04: 2 mg via INTRAVENOUS

## 2017-09-04 MED ORDER — VANCOMYCIN HCL 1000 MG IV SOLR
INTRAVENOUS | Status: AC
  Filled 2017-09-04: qty 1000

## 2017-09-04 MED ORDER — SODIUM CHLORIDE 0.9 % IR SOLN
Status: DC | PRN
Start: 2017-09-04 — End: 2017-09-04
  Administered 2017-09-04: 3000 mL
  Administered 2017-09-04: 1000 mL

## 2017-09-04 MED ORDER — FOSAMPRENAVIR CALCIUM 700 MG PO TABS: 700.0000 mg | ORAL_TABLET | Freq: Two times a day (BID) | ORAL | Status: DC

## 2017-09-04 MED ORDER — MUPIROCIN 2 % EX OINT
1.0000 "application " | TOPICAL_OINTMENT | Freq: Two times a day (BID) | CUTANEOUS | Status: DC
  Administered 2017-09-04 – 2017-09-05 (×3): 1 via NASAL
  Filled 2017-09-04 (×2): qty 22

## 2017-09-04 MED ORDER — FENTANYL CITRATE (PF) 250 MCG/5ML IJ SOLN
INTRAMUSCULAR | Status: DC | PRN
  Administered 2017-09-04 (×2): 50 ug via INTRAVENOUS

## 2017-09-04 MED ORDER — OXYCODONE HCL 5 MG PO TABS
ORAL_TABLET | ORAL | Status: AC
  Administered 2017-09-04: 10 mg via ORAL
  Filled 2017-09-04: qty 2

## 2017-09-04 MED ORDER — DARBEPOETIN ALFA 100 MCG/0.5ML IJ SOSY
100.0000 ug | PREFILLED_SYRINGE | INTRAMUSCULAR | Status: DC
  Administered 2017-09-04: 100 ug via INTRAVENOUS

## 2017-09-04 MED ORDER — VANCOMYCIN HCL 1000 MG IV SOLR
INTRAVENOUS | Status: DC | PRN
  Administered 2017-09-04: 1000 mg via TOPICAL

## 2017-09-04 MED ORDER — DARBEPOETIN ALFA 100 MCG/0.5ML IJ SOSY
PREFILLED_SYRINGE | INTRAMUSCULAR | Status: AC
  Administered 2017-09-04: 100 ug via INTRAVENOUS
  Filled 2017-09-04: qty 0.5

## 2017-09-04 MED ORDER — NEPRO/CARBSTEADY PO LIQD
237.0000 mL | Freq: Two times a day (BID) | ORAL | Status: DC
  Administered 2017-09-05 (×2): 237 mL via ORAL

## 2017-09-04 MED ORDER — CLINDAMYCIN PHOSPHATE 600 MG/50ML IV SOLN
INTRAVENOUS | Status: AC
Start: 2017-09-04 — End: 2017-09-04
  Filled 2017-09-04: qty 50

## 2017-09-04 MED ORDER — LIDOCAINE HCL (CARDIAC) 20 MG/ML IV SOLN
INTRAVENOUS | Status: DC | PRN
  Administered 2017-09-04: 60 mg via INTRATRACHEAL

## 2017-09-04 MED ORDER — BUPIVACAINE HCL 0.5 % IJ SOLN
INTRAMUSCULAR | Status: AC
  Filled 2017-09-04: qty 1

## 2017-09-04 MED ORDER — PHENYLEPHRINE HCL 10 MG/ML IJ SOLN
INTRAMUSCULAR | Status: DC | PRN
  Administered 2017-09-04 (×4): 80 ug via INTRAVENOUS
  Administered 2017-09-04 (×2): 120 ug via INTRAVENOUS

## 2017-09-04 MED ORDER — CLINDAMYCIN PHOSPHATE 600 MG/50ML IV SOLN
600.0000 mg | Freq: Once | INTRAVENOUS | Status: AC
  Administered 2017-09-04: 600 mg via INTRAVENOUS

## 2017-09-04 MED ORDER — PROPOFOL 10 MG/ML IV BOLUS
INTRAVENOUS | Status: DC | PRN
  Administered 2017-09-04: 200 mg via INTRAVENOUS
  Administered 2017-09-04: 100 mg via INTRAVENOUS
  Administered 2017-09-04: 50 mg via INTRAVENOUS

## 2017-09-04 SURGICAL SUPPLY — 56 items
BANDAGE ACE 4X5 VEL STRL LF (GAUZE/BANDAGES/DRESSINGS) IMPLANT
BANDAGE ELASTIC 4 VELCRO ST LF (GAUZE/BANDAGES/DRESSINGS) ×3 IMPLANT
BLADE SAW SGTL 83.5X18.5 (BLADE) IMPLANT
BLADE SURG 15 STRL LF DISP TIS (BLADE) IMPLANT
BLADE SURG 15 STRL SS (BLADE)
BNDG COHESIVE 6X5 TAN STRL LF (GAUZE/BANDAGES/DRESSINGS) IMPLANT
BNDG ESMARK 4X9 LF (GAUZE/BANDAGES/DRESSINGS) IMPLANT
BNDG GAUZE ELAST 4 BULKY (GAUZE/BANDAGES/DRESSINGS) ×3 IMPLANT
BOWL SMART MIX CTS (DISPOSABLE) IMPLANT
CHLORAPREP W/TINT 26ML (MISCELLANEOUS) IMPLANT
COVER SURGICAL LIGHT HANDLE (MISCELLANEOUS) ×3 IMPLANT
CUFF TOURNIQUET SINGLE 18IN (TOURNIQUET CUFF) ×3 IMPLANT
CUFF TOURNIQUET SINGLE 34IN LL (TOURNIQUET CUFF) IMPLANT
DRAPE C-ARM MINI 42X72 WSTRAPS (DRAPES) IMPLANT
DRAPE U-SHAPE 47X51 STRL (DRAPES) IMPLANT
DRSG PAD ABDOMINAL 8X10 ST (GAUZE/BANDAGES/DRESSINGS) IMPLANT
DRSG VAC ATS SM SENSATRAC (GAUZE/BANDAGES/DRESSINGS) ×3 IMPLANT
ELECT CAUTERY BLADE 6.4 (BLADE) ×3 IMPLANT
ELECT REM PT RETURN 9FT ADLT (ELECTROSURGICAL)
ELECTRODE REM PT RTRN 9FT ADLT (ELECTROSURGICAL) IMPLANT
GAUZE SPONGE 4X4 12PLY STRL (GAUZE/BANDAGES/DRESSINGS) IMPLANT
GAUZE SPONGE 4X4 12PLY STRL LF (GAUZE/BANDAGES/DRESSINGS) ×3 IMPLANT
GAUZE XEROFORM 1X8 LF (GAUZE/BANDAGES/DRESSINGS) IMPLANT
GLOVE BIO SURGEON STRL SZ8 (GLOVE) IMPLANT
GLOVE BIOGEL PI IND STRL 8 (GLOVE) IMPLANT
GLOVE BIOGEL PI INDICATOR 8 (GLOVE)
GOWN STRL REUS W/ TWL LRG LVL3 (GOWN DISPOSABLE) IMPLANT
GOWN STRL REUS W/TWL LRG LVL3 (GOWN DISPOSABLE)
HANDPIECE INTERPULSE COAX TIP (DISPOSABLE)
KIT BASIN OR (CUSTOM PROCEDURE TRAY) ×3 IMPLANT
KIT ROOM TURNOVER OR (KITS) ×3 IMPLANT
MANIFOLD NEPTUNE II (INSTRUMENTS) IMPLANT
NEEDLE HYPO 25GX1X1/2 BEV (NEEDLE) ×3 IMPLANT
NS IRRIG 1000ML POUR BTL (IV SOLUTION) ×3 IMPLANT
PACK ORTHO EXTREMITY (CUSTOM PROCEDURE TRAY) ×3 IMPLANT
PAD ARMBOARD 7.5X6 YLW CONV (MISCELLANEOUS) ×6 IMPLANT
PAD CAST 4YDX4 CTTN HI CHSV (CAST SUPPLIES) IMPLANT
PADDING CAST COTTON 4X4 STRL (CAST SUPPLIES)
PADDING CAST COTTON 6X4 STRL (CAST SUPPLIES) IMPLANT
SCRUB BETADINE 4OZ XXX (MISCELLANEOUS) IMPLANT
SET CYSTO W/LG BORE CLAMP LF (SET/KITS/TRAYS/PACK) ×3 IMPLANT
SET HNDPC FAN SPRY TIP SCT (DISPOSABLE) IMPLANT
SOL PREP POV-IOD 4OZ 10% (MISCELLANEOUS) IMPLANT
STAPLER VISISTAT 35W (STAPLE) IMPLANT
STOCKINETTE 6  STRL (DRAPES) ×2
STOCKINETTE 6 STRL (DRAPES) ×1 IMPLANT
STOCKINETTE IMPERVIOUS 9X36 MD (GAUZE/BANDAGES/DRESSINGS) IMPLANT
SUT PROLENE 3 0 PS 2 (SUTURE) IMPLANT
SWAB CULTURE LIQ STUART DBL (MISCELLANEOUS) IMPLANT
SWAB CULTURE LIQUID MINI MALE (MISCELLANEOUS) IMPLANT
SYR CONTROL 10ML LL (SYRINGE) IMPLANT
TOWEL OR 17X24 6PK STRL BLUE (TOWEL DISPOSABLE) ×3 IMPLANT
TOWEL OR 17X26 10 PK STRL BLUE (TOWEL DISPOSABLE) IMPLANT
TUBE CONNECTING 12'X1/4 (SUCTIONS) ×1
TUBE CONNECTING 12X1/4 (SUCTIONS) ×2 IMPLANT
YANKAUER SUCT BULB TIP NO VENT (SUCTIONS) ×3 IMPLANT

## 2017-09-04 NOTE — Anesthesia Postprocedure Evaluation (Signed)
Anesthesia Post Note  Patient: Thomas [name]  Procedure(s) Performed: Procedure(s) (LRB): IRRIGATION AND DEBRIDEMENT EXTREMITY (Right) APPLICATION OF WOUND VAC (Right)     Patient location during evaluation: PACU Anesthesia Type: General Level of consciousness: awake and alert Pain management: pain level controlled Vital Signs Assessment: post-procedure vital signs reviewed and stable Respiratory status: spontaneous breathing, nonlabored ventilation, respiratory function stable and patient connected to nasal cannula oxygen Cardiovascular status: blood pressure returned to baseline and stable Postop Assessment: no signs of nausea or vomiting Anesthetic complications: no    Last Vitals:  Vitals:   09/04/17 1326 09/04/17 1340  BP: 116/78 107/66  Pulse: 80 79  Resp: 18 19  Temp:    SpO2: 92% 94%    Last Pain:  Vitals:   09/04/17 1345  TempSrc:   PainSc: Asleep        RLE Motor Response: Purposeful movement (09/04/17 1345) RLE Sensation: Decreased (09/04/17 1345)      Rocio Wolak,W. EDMOND

## 2017-09-04 NOTE — H&P (Signed)
History and Physical    Thomas [name] VUY:[id number] DOB: [birth year] DOA: 09/03/2017  Referring MD/NP/PA:   PCP: Patient, No Pcp Per   Patient coming from:  The patient is coming from home.  At baseline, pt is independent for most of ADL.   Chief Complaint: right foot heel wound  HPI: Thomas [name] is a 51 y.o. 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  Pt states that he has right foot heel wound noted 2 weeks ago. He has been treated by podiatrist, Dr. March Rummage. Patient does not have foot pain, fever or chills. His has some dark colored draining. Per dr. Eleanora Neighbor note, pt has wet gangrene the right heel. Dr. March Rummage recommended direct admission and he will do debridement of pt's R heel wound tomorrow. Patient denies any chest pain, shortness breath, nausea, vomiting, diarrhea, abdominal pain, symptoms of UTI or unilateral weakness.  Pt was found to have WBC 8.7, potassium 3.4, bicarbonate 27, creatinine 10.01, BUN 44, temperature normal, slightly tachycardia, sensation 91% on room air, INR 2.26. Patient is placed on telemetry bed for observation.  Review of Systems:   General: no fevers, chills, no body weight gain, has fatigue HEENT: no blurry vision, hearing changes or sore throat Respiratory: no dyspnea, coughing, wheezing CV: no chest pain, no palpitations GI: no nausea, vomiting, abdominal pain, diarrhea, constipation GU: no dysuria, burning on urination, increased urinary frequency, hematuria  Ext: no leg edema Neuro: no unilateral weakness, numbness, or tingling, no vision change or hearing loss Skin: has right foot heel wound. MSK: No muscle spasm, no deformity, no limitation of range of movement in spin Heme: No easy bruising.  Travel history: No recent  long distant travel.  Allergy: No Known Allergies  Past Medical History:  Diagnosis Date  . Abdominal pain   . Anemia   . Diabetes ([place])   . ESRD (end stage renal disease) ([place])   . ESRD on dialysis ([place]) 03/13/2017  . Fatigue   . HIV disease ([place])   . Hyperparathyroidism, secondary ([place])   . Hypertension   . Hypertensive heart disease with end stage renal disease on dialysis ([place]) 03/13/2017  . Pulmonary congestion   . SOB (shortness of breath)   . SVT (supraventricular tachycardia) ([place])    ? afib or atrial flutter s/p TEE/DCCV with subsequent ablation due to reoccurrence in [place]    Past Surgical History:  Procedure Laterality Date  . AMPUTATION Left    foot  . COLONOSCOPY WITH PROPOFOL N/A 07/30/2017   Procedure: COLONOSCOPY WITH PROPOFOL;  Surgeon: [name], MD;  Location: [facility];  Service: Endoscopy;  Laterality: N/A;  . ESOPHAGOGASTRODUODENOSCOPY (EGD) WITH PROPOFOL N/A 07/28/2017   Procedure: ESOPHAGOGASTRODUODENOSCOPY (EGD) WITH PROPOFOL;  Surgeon: [name], MD;  Location: [facility];  Service: Endoscopy;  Laterality: N/A;  . FLEXIBLE SIGMOIDOSCOPY N/A 07/27/2017   Procedure: FLEXIBLE SIGMOIDOSCOPY;  Surgeon: [name], MD;  Location: [facility] ENDOSCOPY;  Service: Endoscopy;  Laterality: N/A;  . RIGHT AV FISTULA PLACEMENT  10/19/2012    Social History:  reports that he has never smoked. He has never used smokeless tobacco. He reports that he does not drink alcohol or use drugs.  Family History:  Family History  Problem Relation Age of Onset  . Heart disease Mother   . Diabetes Mother   .  Heart disease Father   . Cancer Neg Hx      Prior to Admission medications   Medication Sig Start Date End Date Taking? Authorizing Provider  amLODipine (NORVASC) 5 MG tablet Take 1 tablet (5 mg total) by mouth daily. 07/31/17   Velvet Bathe, MD  cinacalcet (SENSIPAR) 60 MG tablet Take 60 mg by mouth daily.    [provider]  doxercalciferol (HECTOROL)  4 MCG/2ML injection Inject 2.5 mLs (5 mcg total) into the vein Every Tuesday,Thursday,and Saturday with dialysis. 05/10/17   Hosie Poisson, MD  ferric gluconate 125 mg in sodium chloride 0.9 % 100 mL Inject 125 mg into the vein Every Tuesday,Thursday,and Saturday with dialysis. 05/10/17   Hosie Poisson, MD  ferrous sulfate 325 (65 FE) MG tablet Take 325 mg by mouth 3 (three) times daily with meals.    [provider]  fosamprenavir (LEXIVA) 700 MG tablet Take 700 mg by mouth 2 (two) times daily.     [provider]  insulin aspart (NOVOLOG) 100 UNIT/ML injection CBG 70 - 120: 0 units CBG 121 - 150: 3 units CBG 151 - 200: 4 units CBG 201 - 250: 7 units CBG 251 - 300: 11 units CBG 301 - 350: 15 units CBG 351 - 400: 20 units 05/08/17   Hosie Poisson, MD  insulin glargine (LANTUS) 100 UNIT/ML injection Inject 0.3 mLs (30 Units total) into the skin daily at 10 pm. 05/08/17   Hosie Poisson, MD  metoprolol tartrate (LOPRESSOR) 25 MG tablet Take 1 tablet (25 mg total) by mouth 2 (two) times daily. 07/30/17   Velvet Bathe, MD  Multiple Vitamin (MULTIVITAMIN) tablet Take 1 tablet by mouth daily.    [provider]  Multiple Vitamin (TAB-A-VITE PO) Take 1 tablet by mouth daily.    [provider]  pantoprazole (PROTONIX) 40 MG tablet Take 1 tablet (40 mg total) by mouth 2 (two) times daily. 07/30/17 08/29/17  Velvet Bathe, MD  raltegravir (ISENTRESS) 400 MG tablet Take 400 mg by mouth 2 (two) times daily.    [provider]  ritonavir (NORVIR) 100 MG capsule Take 100 mg by mouth daily with breakfast.    [provider]  sevelamer carbonate (RENVELA) 800 MG tablet Take 800 mg by mouth 3 (three) times daily with meals.    [provider]  warfarin (COUMADIN) 10 MG tablet Take 10 mg by mouth See admin instructions. Take 10 mg by mouth daily Monday through Friday.    [provider]  warfarin (COUMADIN) 7.5 MG tablet Take 7.5 mg by mouth See admin  instructions. Take 7.5 mg by mouth daily on Saturday and Sunday    [provider]    Physical Exam: Vitals:   09/03/17 2016  BP: (!) 143/94  Pulse: (!) 102  Resp: 18  Temp: 98.5 F (36.9 C)  TempSrc: Oral  SpO2: 91%  Height: 6' 6" (1.981 m)   General: Not in acute distress HEENT:       Eyes: PERRL, EOMI, no scleral icterus.       ENT: No discharge from the ears and nose, no pharynx injection, no tonsillar enlargement.        Neck: No JVD, no bruit, no mass felt. Heme: No neck lymph node enlargement. Cardiac: S1/S2, RRR, No murmurs, No gallops or rubs. Respiratory:  No rales, wheezing, rhonchi or rubs. GI: Soft, nondistended, nontender, no rebound pain, no organomegaly, BS present. GU: No hematuria Ext: No pitting leg edema bilaterally. S/p of left  BKA. Musculoskeletal: No joint deformities, No joint redness or warmth, no limitation of ROM in spin. Skin: No rashes. Pt refused to be examined for his right foot since he dose not want me to remove his wrap in right foot. Neuro: Alert, oriented X3, cranial nerves II-XII grossly intact, moves all extremities normally.  Psych: Patient is not psychotic, no suicidal or hemocidal ideation.  Labs on Admission: I have personally reviewed following labs and imaging studies  CBC:  Recent Labs Lab 09/03/17 2208  WBC 8.7  NEUTROABS 6.4  HGB 10.8*  HCT 34.0*  MCV 98.3  PLT 784   Basic Metabolic Panel:  Recent Labs Lab 09/03/17 2208  NA 138  K 3.4*  CL 93*  CO2 27  GLUCOSE 318*  BUN 44*  CREATININE 10.01*  CALCIUM 10.3   GFR: CrCl cannot be calculated (Unknown ideal weight.). Liver Function Tests:  Recent Labs Lab 09/03/17 2208  AST 16  ALT 14*  ALKPHOS 92  BILITOT 0.6  PROT 7.6  ALBUMIN 3.3*   No results for input(s): LIPASE, AMYLASE in the last 168 hours. No results for input(s): AMMONIA in the last 168 hours. Coagulation Profile:  Recent Labs Lab 09/03/17 2208  INR 2.26   Cardiac  Enzymes: No results for input(s): CKTOTAL, CKMB, CKMBINDEX, TROPONINI in the last 168 hours. BNP (last 3 results) No results for input(s): PROBNP in the last 8760 hours. HbA1C: No results for input(s): HGBA1C in the last 72 hours. CBG: No results for input(s): GLUCAP in the last 168 hours. Lipid Profile: No results for input(s): CHOL, HDL, LDLCALC, TRIG, CHOLHDL, LDLDIRECT in the last 72 hours. Thyroid Function Tests: No results for input(s): TSH, T4TOTAL, FREET4, T3FREE, THYROIDAB in the last 72 hours. Anemia Panel: No results for input(s): VITAMINB12, FOLATE, FERRITIN, TIBC, IRON, RETICCTPCT in the last 72 hours. Urine analysis: No results found for: COLORURINE, APPEARANCEUR, LABSPEC, PHURINE, GLUCOSEU, HGBUR, BILIRUBINUR, KETONESUR, PROTEINUR, UROBILINOGEN, NITRITE, LEUKOCYTESUR Sepsis Labs: _0 (procalcitonin:4,lacticidven:4) )No results found for this or any previous visit (from the past 240 hour(s)).   Radiological Exams on Admission: Dg Foot Complete Right  Result Date: 09/03/2017 Please see detailed radiograph report in office note.    EKG: Independently reviewed. Sinus rhythm, QTC 455, PVC, first-degree AV block  Assessment/Plan Principal Problem:   Diabetic foot ulcer (HCC) Active Problems:   ESRD on dialysis ([place])   Type 2 diabetes mellitus with complication (HCC)   HIV disease ([place])   Anemia due to end stage renal disease ([place])   PAF (paroxysmal atrial fibrillation) ([place])   Essential hypertension   GERD (gastroesophageal reflux disease)   Hypokalemia   Diabetic foot ulcer ([place]): Patient does not have fever or leukocytosis. Clinically nonseptic. Hemodynamically stable.  -Will place on telemetry bed for observation -hold off antibiotics - blood culture, CRP and ESR -Follow-up with Dr. March Rummage recommendations  ESRD (end stage renal disease) on dialysis (TTS):  -Left message to renal box for HD -Continue Sensipar, Renvela and Hectoro  PAF (paroxysmal  atrial fibrillation): CHA2DS2-VASc Score is 3, needs oral anticoagulation. Patient is on Coumadin at home. INR is 2.26 on admission. Heart rate is 90-100 -hold coumadin due to need of procedure -continue metoprolol  DM-II: Last A1c 11.7 on 05/05/17, poorly controled. Patient is taking Lantus and NovoLog at home -will decrease Lantus dose from 30 to 20 units daily  -SSI  Anemia due to end stage renal disease: hgb stable. Hgb 10.8 -continue iron supplement  HIV: CD4 310 on 05/05/17, viral load 30 on  03/10/17 -Continue home HIV medications.  Hypokalemia: K 3.4 -will not treat now -f/u by BMP   DVT ppx: SCD Code Status: Full code Family Communication: Yes, patient's  Wife, sister and brother  at bed side Disposition Plan:  Anticipate discharge back to previous home environment Consults called: none  Admission status: Obs / tele    Date of Service 09/04/2017    Ivor Costa [facility] Pager [phone]  If 7PM-7AM, please contact night-coverage [email or web address] Password [email or web address] 09/04/2017, 2:48 AM

## 2017-09-04 NOTE — Care Management Note (Signed)
Case Management Note  Patient Details  Name: Thomas [name] MRN: [id number] Date of Birth: 1966/10/27  Subjective/Objective:     Presented with R foot wound, hx of atrial fibrillation/ Coumadin, hypertension, diabetes mellitus, SVT, ESRD-HD (TTS), HIV , anemia due to ESRD,  CHF, left BKA. From home with wife. Independent with ADL's. DME : wheelchair.          Thomas [name] (Spouse)     [phone]      PCP:  Action/Plan:   Pt awaiting debridement of  R heel wound today.... CM received consult :  Reason for consult: Equipment    Home health needs        CM to f/u with d/c needs.  Expected Discharge Date:                  Expected Discharge Plan:  Home/Self Care  In-House Referral:     Discharge planning Services  CM Consult  Post Acute Care Choice:    Choice offered to:  Patient  DME Arranged:    DME Agency:     HH Arranged:    Old Agency Agency:     Status of Service:  In process, will continue to follow  If discussed at Long Length of Stay Meetings, dates discussed:    Additional Comments:  Sharin Mons, RN 09/04/2017, 11:11 AM

## 2017-09-04 NOTE — Brief Op Note (Signed)
09/03/2017 - 09/04/2017  12:58 PM  PATIENT:  Thomas [name]  51 y.o. male  PRE-OPERATIVE DIAGNOSIS:  Wet Gangrene  POST-OPERATIVE DIAGNOSIS:  Wet Gangrene  PROCEDURE:  Procedure(s): IRRIGATION AND DEBRIDEMENT EXTREMITY (Right) APPLICATION OF WOUND VAC (Right)  SURGEON:  Surgeon(s) and Role:    * [name], DPM - Primary  PHYSICIAN ASSISTANT:   ASSISTANTS: none   ANESTHESIA:   local and general  EBL:  Total I/O In: 500 [I.V.:500] Out: 10 [Blood:10]  BLOOD ADMINISTERED:none  DRAINS: Wound VAC   LOCAL MEDICATIONS USED:  MARCAINE     SPECIMEN:  No Specimen  DISPOSITION OF SPECIMEN:  N/A  COUNTS:  YES  TOURNIQUET:  * No tourniquets in log *  DICTATION: .Note written in EPIC  PLAN OF CARE: return to floor  PATIENT DISPOSITION:  PACU - hemodynamically stable.   Delay start of Pharmacological VTE agent (>24hrs) due to surgical blood loss or risk of bleeding: no

## 2017-09-04 NOTE — Progress Notes (Signed)
Inpatient Diabetes Program Recommendations  AACE/ADA: New Consensus Statement on Inpatient Glycemic Control (2015)  Target Ranges:  Prepandial:   less than 140 mg/dL      Peak postprandial:   less than 180 mg/dL (1-2 hours)      Critically ill patients:  140 - 180 mg/dL   Lab Results  Component Value Date   GLUCAP 158 (H) 09/04/2017   HGBA1C 11.7 (H) 05/05/2017    Review of Glycemic Control  Results for PACO, CISLO (MRN [id number]) as of 09/04/2017 12:30  Ref. Range 09/04/2017 08:04 09/04/2017 11:39  Glucose-Capillary Latest Ref Range: 65 - 99 mg/dL 158 (H) 163 (H)     Diabetes history: Type 2 Outpatient Diabetes medications: Levemir 30 units qhs, Novolog 0-20 units tid Current orders for Inpatient glycemic control: Levemir 20 units qhs, Novolog 0-9 units tid  Inpatient Diabetes Program Recommendations:  Consider adding Novolog 3 units tid with meals, once the patient is eating (hold if patient eats less than 50%)   Per ADA recommendations "consider performing an A1C on all patients with diabetes or hyperglycemia admitted to the hospital if not performed in the prior 3 months".    Gentry Fitz, RN, BA, MHA, CDE Diabetes Coordinator Inpatient Diabetes Program  [phone] (Team Pager) [phone] ([place]) 09/04/2017 10:48 AM

## 2017-09-04 NOTE — Progress Notes (Signed)
Dialysis treatment completed.  2000 mL ultrafiltrated and net fluid removal 1500 mL.    Patient status unchanged. Lung sounds diminished to ausculation in all fields. Generalized edema. Cardiac: NSR.  Disconnected lines and removed needles.  Pressure held for 10 minutes and band aid/gauze dressing applied.  Report given to bedside RN, Leonides Sake.

## 2017-09-04 NOTE — H&P (View-Only) (Signed)
Subjective:    Patient ID: Thomas [name], male    DOB: Jan 11, 1966, 51 y.o.   MRN: [id number]  HPI Chief Complaint  Patient presents with  . Wound Check    Right foot; bottom  & side of heel; pt stated, "Has had for the past 2 weeks; and has used perioxide to clean"; Pt Diabetic Type 2; Sugar=did not check today; A1C=11.7 (05/05/17)   51 y.o. male presents with the above complaint. Has hx of L BKA, uncontrolled DM, on HD. Reports 2 week hx of ulcer to his R heel. Believes it started from pressure. Denies N/V/F/Ch.   Past Medical History:  Diagnosis Date  . Abdominal pain   . Anemia   . Diabetes ([place])   . ESRD (end stage renal disease) ([place])   . ESRD on dialysis ([place]) 03/13/2017  . Fatigue   . HIV disease ([place])   . Hyperparathyroidism, secondary ([place])   . Hypertension   . Hypertensive heart disease with end stage renal disease on dialysis ([place]) 03/13/2017  . Pulmonary congestion   . SOB (shortness of breath)   . SVT (supraventricular tachycardia) ([place])    ? afib or atrial flutter s/p TEE/DCCV with subsequent ablation due to reoccurrence in [place]   Past Surgical History:  Procedure Laterality Date  . AMPUTATION Left    foot  . COLONOSCOPY WITH PROPOFOL N/A 07/30/2017   Procedure: COLONOSCOPY WITH PROPOFOL;  Surgeon: [name], MD;  Location: [facility];  Service: Endoscopy;  Laterality: N/A;  . ESOPHAGOGASTRODUODENOSCOPY (EGD) WITH PROPOFOL N/A 07/28/2017   Procedure: ESOPHAGOGASTRODUODENOSCOPY (EGD) WITH PROPOFOL;  Surgeon: [name], MD;  Location: [facility];  Service: Endoscopy;  Laterality: N/A;  . FLEXIBLE SIGMOIDOSCOPY N/A 07/27/2017   Procedure: FLEXIBLE SIGMOIDOSCOPY;  Surgeon: [name], MD;  Location: [facility] ENDOSCOPY;  Service: Endoscopy;  Laterality: N/A;  . RIGHT AV FISTULA PLACEMENT  10/19/2012    Current Outpatient Prescriptions:  .  amLODipine (NORVASC) 5 MG tablet, Take 1 tablet (5 mg total) by mouth daily., Disp: 30 tablet, Rfl: 0 .  cinacalcet  (SENSIPAR) 60 MG tablet, Take 60 mg by mouth daily., Disp: , Rfl:  .  doxercalciferol (HECTOROL) 4 MCG/2ML injection, Inject 2.5 mLs (5 mcg total) into the vein Every Tuesday,Thursday,and Saturday with dialysis., Disp: 2 mL, Rfl:  .  ferric gluconate 125 mg in sodium chloride 0.9 % 100 mL, Inject 125 mg into the vein Every Tuesday,Thursday,and Saturday with dialysis., Disp: , Rfl:  .  ferrous sulfate 325 (65 FE) MG tablet, Take 325 mg by mouth 3 (three) times daily with meals., Disp: , Rfl:  .  fosamprenavir (LEXIVA) 700 MG tablet, Take 700 mg by mouth 2 (two) times daily. , Disp: , Rfl:  .  insulin aspart (NOVOLOG) 100 UNIT/ML injection, CBG 70 - 120: 0 units CBG 121 - 150: 3 units CBG 151 - 200: 4 units CBG 201 - 250: 7 units CBG 251 - 300: 11 units CBG 301 - 350: 15 units CBG 351 - 400: 20 units, Disp: 10 mL, Rfl: 11 .  insulin glargine (LANTUS) 100 UNIT/ML injection, Inject 0.3 mLs (30 Units total) into the skin daily at 10 pm., Disp: 10 mL, Rfl: 11 .  metoprolol tartrate (LOPRESSOR) 25 MG tablet, Take 1 tablet (25 mg total) by mouth 2 (two) times daily., Disp: 60 tablet, Rfl: 0 .  Multiple Vitamin (MULTIVITAMIN) tablet, Take 1 tablet by mouth daily., Disp: , Rfl:  .  Multiple Vitamin (TAB-A-VITE PO), Take 1 tablet by mouth  daily., Disp: , Rfl:  .  raltegravir (ISENTRESS) 400 MG tablet, Take 400 mg by mouth 2 (two) times daily., Disp: , Rfl:  .  ritonavir (NORVIR) 100 MG capsule, Take 100 mg by mouth daily with breakfast., Disp: , Rfl:  .  sevelamer carbonate (RENVELA) 800 MG tablet, Take 800 mg by mouth 3 (three) times daily with meals., Disp: , Rfl:  .  warfarin (COUMADIN) 10 MG tablet, Take 10 mg by mouth See admin instructions. Take 10 mg by mouth daily Monday through Friday., Disp: , Rfl:  .  warfarin (COUMADIN) 7.5 MG tablet, Take 7.5 mg by mouth See admin instructions. Take 7.5 mg by mouth daily on Saturday and Sunday, Disp: , Rfl:  .  pantoprazole (PROTONIX) 40 MG tablet, Take 1 tablet (40  mg total) by mouth 2 (two) times daily., Disp: 30 tablet, Rfl: 1  No Known Allergies   Review of Systems  All other systems reviewed and are negative.      Objective:   Physical Exam Vitals:   09/03/17 1633  BP: 115/65  Pulse: 90  Resp: 16   General AA&O x3. Normal mood and affect.  Vascular Dorsalis pedis and posterior tibial pulses  absent right  Capillary refill delayed ~ 5 seconds to all R digits. Pedal hair growth absent.  Neurologic Epicritic sensation grossly absent.  Dermatologic (Wound) Wound Location: R medial heel Wound Measurement: 6x6 Wound Base: Necrotic eschar, boggy Peri-wound: Calloused Exudate: none  Orthopedic: L BKA.    Radiographs: Taken and reviewed. No osseous erosions present. No acute fractures or dislocations. Severe calcifications noted.   Assessment & Plan:  Patient was evaluated and treated and all questions answered.  Wet Gangrene of R Heel -XR reviewed as above. -Wound does not appear to probe to bone. Local wet gangrene but no peri-wound infection. -Discussed with patient that he would benefit from admission and debridement of his R heel wound. Will coordinate direct admission to the hospital under the hospitalist service. -Wound dressed with betadine and DSD.   40 minutes of face to face time were spent with the patient. >50% of this was spent on counseling and coordination of care. Specifically discussed with patient high possibility of future amputation due to multiple medical issues. Coordinated direct admission to the hospital under the hospitalist service. Will plan for operative debridement 09/04/17.

## 2017-09-04 NOTE — Consult Note (Signed)
WOC consult requested for right foot wound.  EMR indicates that pt is followed by Dr March Rummage and he plans to perform surgery today.  Please refer to him for further questions and plan of care. Please re-consult if further assistance is needed.  Thank-you,  Julien Girt MSN, [place], [place], [place], [place]

## 2017-09-04 NOTE — Telephone Encounter (Signed)
error 

## 2017-09-04 NOTE — Interval H&P Note (Signed)
History and Physical Interval Note:  09/04/2017 11:55 AM  Thomas [name]  has presented today for surgery, with the diagnosis of Wet Gangrene  The various methods of treatment have been discussed with the patient and family. After consideration of risks, benefits and other options for treatment, the patient has consented to  Procedure(s): IRRIGATION AND DEBRIDEMENT EXTREMITY (Right) as a surgical intervention .  The patient's history has been reviewed, patient examined, no change in status, stable for surgery.  I have reviewed the patient's chart and labs.  Questions were answered to the patient's satisfaction.    Consent signed and in chart. To OR today for debridement, wound VAC application R heel.  [name]

## 2017-09-04 NOTE — Consult Note (Signed)
[facility] Renal Consultation Note  Indication for Consultation:  Management of ESRD/hemodialysis; anemia, hypertension/volume and secondary hyperparathyroidism  HPI: Thomas [name] is a 51 y.o. male with ESRD 1st HD 08/2012;[place],  transferred from  [place] 01//2018 to [place] HTN/DM, PVD s/p left BKA 2/2 ( uses  prosthesis ),HIV on HAART, h/o AF on chronic coumadin (previous cardioversion and then ablation [facility] summer 2017 now admitted with right foot heel wound/ worsening per pt.with dark colored draining. Seen by  podiatrist, Dr. March Rummage.yesterday in his office and noted to have wet gangrene the right heel. Dr. March Rummage recommended direct admission and he will do debridement of pt's R heel wound today . Pt has OP HD TTS  And is complaint . He denies any chest pain, shortness breath, nausea, vomiting, diarrhea, abdominal pain, symptoms of UTI or unilateral weakness foot pain, fever or chills.     Note plans for 11 am surgery today . INR was 2.26 with Comadin held by [facility]  ,hd later today after suergy. Currently no cos.      Past Medical History:  Diagnosis Date  . Abdominal pain   . Anemia   . Diabetes ([place])   . ESRD (end stage renal disease) ([place])   . ESRD on dialysis ([place]) 03/13/2017  . Fatigue   . HIV disease ([place])   . Hyperparathyroidism, secondary ([place])   . Hypertension   . Hypertensive heart disease with end stage renal disease on dialysis ([place]) 03/13/2017  . Pulmonary congestion   . SOB (shortness of breath)   . SVT (supraventricular tachycardia) ([place])    ? afib or atrial flutter s/p TEE/DCCV with subsequent ablation due to reoccurrence in [place]    Past Surgical History:  Procedure Laterality Date  . AMPUTATION Left    foot  . COLONOSCOPY WITH PROPOFOL N/A 07/30/2017   Procedure: COLONOSCOPY WITH PROPOFOL;  Surgeon: [name], MD;  Location: [facility];  Service: Endoscopy;  Laterality: N/A;  . ESOPHAGOGASTRODUODENOSCOPY (EGD) WITH PROPOFOL N/A  07/28/2017   Procedure: ESOPHAGOGASTRODUODENOSCOPY (EGD) WITH PROPOFOL;  Surgeon: [name], MD;  Location: [facility];  Service: Endoscopy;  Laterality: N/A;  . FLEXIBLE SIGMOIDOSCOPY N/A 07/27/2017   Procedure: FLEXIBLE SIGMOIDOSCOPY;  Surgeon: [name], MD;  Location: [facility] ENDOSCOPY;  Service: Endoscopy;  Laterality: N/A;  . RIGHT AV FISTULA PLACEMENT  10/19/2012      Family History  Problem Relation Age of Onset  . Heart disease Mother   . Diabetes Mother   . Heart disease Father   . Cancer Neg Hx       reports that he has never smoked. He has never used smokeless tobacco. He reports that he does not drink alcohol or use drugs.  No Known Allergies  Prior to Admission medications   Medication Sig Start Date End Date Taking? Authorizing Provider  amLODipine (NORVASC) 5 MG tablet Take 1 tablet (5 mg total) by mouth daily. 07/31/17   Velvet Bathe, MD  cinacalcet (SENSIPAR) 60 MG tablet Take 60 mg by mouth daily.    [provider]  doxercalciferol (HECTOROL) 4 MCG/2ML injection Inject 2.5 mLs (5 mcg total) into the vein Every Tuesday,Thursday,and Saturday with dialysis. 05/10/17   Hosie Poisson, MD  ferric gluconate 125 mg in sodium chloride 0.9 % 100 mL Inject 125 mg into the vein Every Tuesday,Thursday,and Saturday with dialysis. 05/10/17   Hosie Poisson, MD  ferrous sulfate 325 (65 FE) MG tablet Take 325 mg by mouth 3 (three) times daily with meals.  [provider]  fosamprenavir (LEXIVA) 700 MG tablet Take 700 mg by mouth 2 (two) times daily.     [provider]  insulin aspart (NOVOLOG) 100 UNIT/ML injection CBG 70 - 120: 0 units CBG 121 - 150: 3 units CBG 151 - 200: 4 units CBG 201 - 250: 7 units CBG 251 - 300: 11 units CBG 301 - 350: 15 units CBG 351 - 400: 20 units 05/08/17   Hosie Poisson, MD  insulin glargine (LANTUS) 100 UNIT/ML injection Inject 0.3 mLs (30 Units total) into the skin daily at 10 pm. 05/08/17   Hosie Poisson, MD   metoprolol tartrate (LOPRESSOR) 25 MG tablet Take 1 tablet (25 mg total) by mouth 2 (two) times daily. 07/30/17   Velvet Bathe, MD  Multiple Vitamin (MULTIVITAMIN) tablet Take 1 tablet by mouth daily.    [provider]  Multiple Vitamin (TAB-A-VITE PO) Take 1 tablet by mouth daily.    [provider]  pantoprazole (PROTONIX) 40 MG tablet Take 1 tablet (40 mg total) by mouth 2 (two) times daily. 07/30/17 08/29/17  Velvet Bathe, MD  raltegravir (ISENTRESS) 400 MG tablet Take 400 mg by mouth 2 (two) times daily.    [provider]  ritonavir (NORVIR) 100 MG capsule Take 100 mg by mouth daily with breakfast.    [provider]  sevelamer carbonate (RENVELA) 800 MG tablet Take 800 mg by mouth 3 (three) times daily with meals.    [provider]  warfarin (COUMADIN) 10 MG tablet Take 10 mg by mouth See admin instructions. Take 10 mg by mouth daily Monday through Friday.    [provider]  warfarin (COUMADIN) 7.5 MG tablet Take 7.5 mg by mouth See admin instructions. Take 7.5 mg by mouth daily on Saturday and Sunday    [provider]    XHB:ZJIRCVELFYBOF **OR** acetaminophen, hydrALAZINE, ondansetron **OR** ondansetron (ZOFRAN) IV, zolpidem  Results for orders placed or performed during the hospital encounter of 09/03/17 (from the past 48 hour(s))  CBC with Differential/Platelet     Status: Abnormal   Collection Time: 09/03/17 10:08 PM  Result Value Ref Range   WBC 8.7 4.0 - 10.5 K/uL   RBC 3.46 (L) 4.22 - 5.81 MIL/uL   Hemoglobin 10.8 (L) 13.0 - 17.0 g/dL   HCT 34.0 (L) 39.0 - 52.0 %   MCV 98.3 78.0 - 100.0 fL   MCH 31.2 26.0 - 34.0 pg   MCHC 31.8 30.0 - 36.0 g/dL   RDW 18.9 (H) 11.5 - 15.5 %   Platelets 242 150 - 400 K/uL   Neutrophils Relative % 73 %   Neutro Abs 6.4 1.7 - 7.7 K/uL   Lymphocytes Relative 14 %   Lymphs Abs 1.2 0.7 - 4.0 K/uL   Monocytes Relative 9 %   Monocytes Absolute 0.8 0.1 - 1.0 K/uL   Eosinophils  Relative 3 %   Eosinophils Absolute 0.3 0.0 - 0.7 K/uL   Basophils Relative 1 %   Basophils Absolute 0.0 0.0 - 0.1 K/uL  Comprehensive metabolic panel     Status: Abnormal   Collection Time: 09/03/17 10:08 PM  Result Value Ref Range   Sodium 138 135 - 145 mmol/L   Potassium 3.4 (L) 3.5 - 5.1 mmol/L   Chloride 93 (L) 101 - 111 mmol/L   CO2 27 22 - 32 mmol/L   Glucose, Bld 318 (H) 65 - 99 mg/dL   BUN 44 (H) 6 - 20 mg/dL   Creatinine, Ser 10.01 (  H) 0.61 - 1.24 mg/dL   Calcium 10.3 8.9 - 10.3 mg/dL   Total Protein 7.6 6.5 - 8.1 g/dL   Albumin 3.3 (L) 3.5 - 5.0 g/dL   AST 16 15 - 41 U/L   ALT 14 (L) 17 - 63 U/L   Alkaline Phosphatase 92 38 - 126 U/L   Total Bilirubin 0.6 0.3 - 1.2 mg/dL   GFR calc non Af Amer 5 (L) >60 mL/min   GFR calc Af Amer 6 (L) >60 mL/min    Comment: (NOTE) The eGFR has been calculated using the CKD EPI equation. This calculation has not been validated in all clinical situations. eGFR's persistently <60 mL/min signify possible Chronic Kidney Disease.    Anion gap 18 (H) 5 - 15  Protime-INR     Status: Abnormal   Collection Time: 09/03/17 10:08 PM  Result Value Ref Range   Prothrombin Time 24.8 (H) 11.4 - 15.2 seconds   INR 2.26   Sedimentation rate     Status: Abnormal   Collection Time: 09/03/17 10:08 PM  Result Value Ref Range   Sed Rate 81 (H) 0 - 16 mm/hr  C-reactive protein     Status: Abnormal   Collection Time: 09/03/17 10:08 PM  Result Value Ref Range   CRP 4.6 (H) <1.0 mg/dL  Prealbumin     Status: None   Collection Time: 09/03/17 10:08 PM  Result Value Ref Range   Prealbumin 31.3 18 - 38 mg/dL  Surgical pcr screen     Status: Abnormal   Collection Time: 09/03/17 11:15 PM  Result Value Ref Range   MRSA, PCR NEGATIVE NEGATIVE   Staphylococcus aureus POSITIVE (A) NEGATIVE    Comment: (NOTE) The Xpert SA Assay (FDA approved for NASAL specimens in patients 88 years of age and older), is one component of a comprehensive surveillance  program. It is not intended to diagnose infection nor to guide or monitor treatment.   Basic metabolic panel     Status: Abnormal   Collection Time: 09/04/17  4:52 AM  Result Value Ref Range   Sodium 139 135 - 145 mmol/L   Potassium 3.4 (L) 3.5 - 5.1 mmol/L   Chloride 95 (L) 101 - 111 mmol/L   CO2 28 22 - 32 mmol/L   Glucose, Bld 157 (H) 65 - 99 mg/dL   BUN 48 (H) 6 - 20 mg/dL   Creatinine, Ser 10.69 (H) 0.61 - 1.24 mg/dL   Calcium 10.1 8.9 - 10.3 mg/dL   GFR calc non Af Amer 5 (L) >60 mL/min   GFR calc Af Amer 6 (L) >60 mL/min    Comment: (NOTE) The eGFR has been calculated using the CKD EPI equation. This calculation has not been validated in all clinical situations. eGFR's persistently <60 mL/min signify possible Chronic Kidney Disease.    Anion gap 16 (H) 5 - 15  CBC     Status: Abnormal   Collection Time: 09/04/17  4:52 AM  Result Value Ref Range   WBC 8.5 4.0 - 10.5 K/uL   RBC 3.40 (L) 4.22 - 5.81 MIL/uL   Hemoglobin 10.9 (L) 13.0 - 17.0 g/dL   HCT 32.8 (L) 39.0 - 52.0 %   MCV 96.5 78.0 - 100.0 fL   MCH 32.1 26.0 - 34.0 pg   MCHC 33.2 30.0 - 36.0 g/dL   RDW 19.1 (H) 11.5 - 15.5 %   Platelets 205 150 - 400 K/uL    Comment: PLATELET COUNT CONFIRMED BY SMEAR  Glucose,  capillary     Status: Abnormal   Collection Time: 09/04/17  8:04 AM  Result Value Ref Range   Glucose-Capillary 158 (H) 65 - 99 mg/dL    ROS:  See hpi  Physical Exam: Vitals:   09/03/17 2016 09/04/17 0542  BP: (!) 143/94 (!) 144/83  Pulse: (!) 102 87  Resp: 18 18  Temp: 98.5 F (36.9 C) 98.4 F (36.9 C)  SpO2: 91% 98%     General: alert AAM  nad , OX4 , Appropriate  HEENT: [place], EOMI, MMM  Neck: no JVD Heart:  Distant sound Irreg, irreg rate 94 , no m, r, g  Lungs: CTA , unlabored breathing  Abdomen: obese, bs =Normal soft , nt, nd  Extremities: L BKA no edema / R foot bandaged  Clean and dry Skin: No overt rash, warm ,dry Neuro:  Alert / Ox4 ,Moves all extrem , no overt focal deficits  noted  Dialysis Access: Pos bruit RFA AVF  Dialysis Orders: Center: [facility]  on TTS . EDW 123 kg  HD Bath 3k 2.25 ca   Time 4 hr 76mn Heparin none . Access RFA AVF       Hec  9 mcg IV/HD      Mircera 200 mg last given 08/21/17  q 2 wks (due next 09/04/17)    Assessment/Plan 1. R Foot Wound  "Gangrene per Dr. PMarch Rummagenotes"  -Dr. PMarch Rummagesurgery today  / noted  admit team iv antib  Held until Dr. PMarch Rummagesees recommends  2. ESRD -  HD TTS K= 3.4   Hd later today after Dr. PMarch Rummagesurgery Using ^ K bath as @ [facility].  3. Hypertension/volume  - bp 144/83  On home meds  Amlodipine 5 mg qday/Lopressor 239m Bid  4. A Fib- coumadin on hold for surgery/ Inr 2.26 5. Anemia  - hgb 10.9  Esa  Dose smaller dose 10063mranesp today q thurs / Noted on IV Iron And PO Fe on med list (per pt not taking and op tfs was 18%  08/21/17  BUT in setting of Infection will hold iv iron and dc po iron 6. Metabolic bone disease -  Renvela binder and hect.9mc20mIv on HD , with ca 10.1 (op pth only 351 ) hold iv Hect until ca <10 and will  decrease to 5mcg28mn Sensipar 60mg 50my  also 7. HIV - on meds  8. IDDM type 2 - per admit   David Ernest Haber [id number], 8:42 AM    Pt seen, examined and agree w A/P as above.  Rob ScKelly [employer] [phone], 1:02 PM

## 2017-09-04 NOTE — Progress Notes (Signed)
Vitals taken on arrival to 5w05

## 2017-09-04 NOTE — Progress Notes (Signed)
Initial Nutrition Assessment  DOCUMENTATION CODES:   Obesity unspecified  INTERVENTION:  Provide Nepro Shake po BID, each supplement provides 425 kcal and 19 grams protein.  Provide 30 ml Prostat po once daily, each supplement provides 100 kcal and 15 grams of protein.   NUTRITION DIAGNOSIS:   Increased nutrient needs related to wound healing as evidenced by estimated needs.  GOAL:   Patient will meet greater than or equal to 90% of their needs  MONITOR:   PO intake, Supplement acceptance, Labs, Weight trends, Skin, I & O's  REASON FOR ASSESSMENT:   Consult Wound healing  ASSESSMENT:   51 y.o. 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  PROCEDURE:  (9/6): IRRIGATION AND DEBRIDEMENT EXTREMITY (Right) APPLICATION OF WOUND VAC (Right)  Pt was unavailable, in OR, during attempted time of visit. Pt with no significant weight loss per weight records. RD to order nutritional supplements to aid in wound healing. Unable to complete Nutrition-Focused physical exam at this time. Labs and medications reviewed.   Diet Order:  Diet renal/carb modified with fluid restriction Diet-HS Snack? Nothing; Room service appropriate? Yes; Fluid consistency: Thin  Skin:  Wound (see comment) (wound R heel)  Last BM:  9/5  Height:   Ht Readings from Last 1 Encounters:  09/04/17 6\' 6"  (1.981 m)    Weight:   Wt Readings from Last 1 Encounters:  09/04/17 273 lb 9.6 oz (124.1 kg)    Ideal Body Weight:  90.9 kg (adjusted for L BKA)  BMI:  Body mass index is 31.62 kg/m.  Estimated Nutritional Needs:   Kcal:  2103-1281  Protein:  125-135 grams  Fluid:  Per MD  EDUCATION NEEDS:   No education needs identified at this time  Corrin Parker, MS, RD,  LDN Pager # [phone] After hours/ weekend pager # [phone]

## 2017-09-04 NOTE — H&P (Signed)
Anesthesia H&P Update: History and Physical Exam reviewed; patient is OK for planned anesthetic and procedure. ? ?

## 2017-09-04 NOTE — Op Note (Signed)
Patient Name: Thomas [name] DOB: 1966-01-29  MRN: [id number]  Date of Service: 09/04/17   Surgeon: Dr. Hardie Pulley, DPM Assistants: None Pre-operative Diagnosis: wet gangrene heel Post-operative Diagnosis: same Procedures:             1) Debridement and Irrigation of R Heel (wound 7.5 x 5.5 x 0.6 for a total of 41.25 cm2 debrided)  2) Application of wound VAC Pathology/Specimens:             1) none Anesthesia: General with 10 ml Marcaine 0.5 plain PT block Hemostasis: Anatomic Estimated Blood Loss: 10 Materials: None Medications: 1 g Vancomycin powder Complications: None  Indications for Procedure: Thomas [name] is a 51 y.o. male who was seen in the office yesterday with wet gangrene of his heel. Due to the local findings and comorbidities including uncontrolled DM, ESRD, and L BKA, it was discussed with the patient that he would benefit from debridement and irrigation of the wound. All risks, benefits, and alternatives discussed. No guarantees were given. Patient elected to proceed. He was admitted to the hospital with plans for debridement today.  Procedure in Detail: Patient was identified in pre-operative holding area. Formal consent was signed and the right lower extremity was marked. Patient was brought back to the operating room and placed on the operating room table in the supine position. Anesthesia was induced. The right lower extremity was prepped and draped in the usual sterile fashion. Timeout was taken to confirm patient name, laterality, and procedure prior to incision. Attention was then directed to the right heel where a large necrotic, gangrenous wound was noted. The wound base covered with a eschar with boggy feel and surrounding fibrotic tissue. The eschar was excised and significant necrotic tissue was encountered. Sharp, excisional debridement was performed. The wound was thoroughly debrided of skin, subcutaneous tissue, and fascia. The bone was palpated and found to  have a thin layer of fat and muscle coverage. Debridement was carried down to the level of the deep fascia. Bone was not debrided. The wound was then thoroughly irrigated with 3L of normal saline. 1g of Vancomycin powder was applied topically. Final wound measurements were 7.5 x 5.5 x 0.6 with 41.25 cm2 debrided. A small wound VAC sponge was then applied and secured with the included adherent dressing. The trackpad was applied. This was hooked up to suction as the foot was dressed with 4x4, cast padding, and ACE bandage. The VAC tubing was then connected to the machine and set to 125 mmHg continuous with good seal noted. Patient tolerated the procedure well without immediate complication.  Disposition: Following a period of post-operative monitoring, patient will be transferred back to the floor. Plan for discharge tomorrow. Will require home wound VAC at discharge.

## 2017-09-04 NOTE — Anesthesia Procedure Notes (Signed)
Procedure Name: LMA Insertion Date/Time: 09/04/2017 12:23 PM Performed by: Julieta Bellini Pre-anesthesia Checklist: Patient identified, Emergency Drugs available, Suction available and Patient being monitored Patient Re-evaluated:Patient Re-evaluated prior to induction Oxygen Delivery Method: Circle system utilized Preoxygenation: Pre-oxygenation with 100% oxygen Induction Type: IV induction Ventilation: Mask ventilation without difficulty LMA: LMA inserted LMA Size: 5.0 Number of attempts: 1 Placement Confirmation: positive ETCO2 and breath sounds checked- equal and bilateral Tube secured with: Tape Dental Injury: Teeth and Oropharynx as per pre-operative assessment

## 2017-09-04 NOTE — Progress Notes (Signed)
PROGRESS NOTE  Thomas [name]  UXN:[id number] DOB: 01-18-1966 DOA: 09/03/2017 PCP: Patient, No Pcp Per  Outpatient Specialists: Podiatry: Hardie Pulley, DPM Brief Narrative: Thomas [name] is a 51 y.o. male with a history of AFib on coumadin, IDDM, HTN, ESRD (TTS), HIV (CD4 310 May 2018), chronic combined CHF, and PVD s/p left BKA who was sent to the hospital by his podiatrist, Dr. March Rummage, for wet gangrene of the right foot. He underwent I&D of right heel wound by Dr. March Rummage 9/6. HD was performed by nephrology 9/6, and plan is to DC 9/7 with wound vac.   Assessment & Plan: Principal Problem:   Diabetic foot ulcer ([place]) Active Problems:   ESRD on dialysis ([place])   Type 2 diabetes mellitus with complication ([facility])   HIV disease ([place])   Anemia due to end stage renal disease ([place])   PAF (paroxysmal atrial fibrillation) ([place])   Essential hypertension   GERD (gastroesophageal reflux disease)   Hypokalemia   Diabetic wet gangrene of the foot ([place])  Diabetic foot ulcer with wet gangrene: Hemodynamically stable, without sepsis.  - Holding abx, monitoring culture - s/p I&D with wound vac placement 9/6 by Dr. March Rummage. Plan to DC home 9/7 if stable.   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  - HD performed 9/6 per routine.  - Continue Sensipar, Renvela and Hectorol  URK:[id number] Score is 3, he's on coumadin and in NSR.  - Will restart coumadin postop. INR 2.26 on admission.  - Continue metoprolol  Uncontrolled IDT2DM:Last A1c 11.7% on 05/05/17.  - At inpatient goal on reduced dose lantus, will continue this.  - SSI  Anemia of chronic disease: Hgb at baseline, 10.8.  - Monitor CBC in AM for postop change.   HIV:CD4 310 on 05/05/17, viral load 30 on 03/10/17 - Continue home ART  Hypokalemia: K 3.4 - Tx with HD.  DVT prophylaxis: Coumadin Code Status: Full Family Communication: None at bedside Disposition Plan: [place] home with home health  9/7  Consultants:   Podiatry, Dr. March Rummage  Nephrology, Dr. Jonnie Finner  Procedures:   Right heel I&D by Dr. March Rummage 9/6  Routine HD 9/6  Antimicrobials:  None   Subjective: No significant pain. Hungry. No fevers, chills, fatigue, malaise, chest pain, dyspnea.   Objective: Vitals:   09/04/17 1423 09/04/17 1516 09/04/17 1528 09/04/17 1600  BP: 122/73 124/72 123/73 113/64  Pulse: 75 76 74 79  Resp:  16    Temp: (!) 97.5 F (36.4 C) 97.6 F (36.4 C)    TempSrc: Oral     SpO2: 94%     Weight:  124.1 kg (273 lb 9.5 oz)    Height:        Intake/Output Summary (Last 24 hours) at 09/04/17 1606 Last data filed at 09/04/17 1356  Gross per 24 hour  Intake              525 ml  Output               40 ml  Net              485 ml   Filed Weights   09/04/17 0542 09/04/17 1516  Weight: 124.1 kg (273 lb 9.6 oz) 124.1 kg (273 lb 9.5 oz)    Gen: 51 y.o. male in no distress Pulm: Non-labored breathing room air. Clear to auscultation bilaterally.  CV: Regular rate and rhythm. No murmur, rub, or gallop. No JVD, no pedal  edema. GI: Abdomen soft, non-tender, non-distended, with normoactive bowel sounds. No organomegaly or masses felt. Ext: Left BKA with normal appearing stump Skin: Exam of right foot refused by patient.  Neuro: Alert and oriented. No focal neurological deficits. Psych: Judgement and insight appear normal. Mood & affect appropriate.   Data Reviewed: I have personally reviewed following labs and imaging studies  CBC:  Recent Labs Lab 09/03/17 2208 09/04/17 0452  WBC 8.7 8.5  NEUTROABS 6.4  --   HGB 10.8* 10.9*  HCT 34.0* 32.8*  MCV 98.3 96.5  PLT 242 025   Basic Metabolic Panel:  Recent Labs Lab 09/03/17 2208 09/04/17 0452  NA 138 139  K 3.4* 3.4*  CL 93* 95*  CO2 27 28  GLUCOSE 318* 157*  BUN 44* 48*  CREATININE 10.01* 10.69*  CALCIUM 10.3 10.1   GFR: Estimated Creatinine Clearance: 12.1 mL/min (A) (by C-G formula based on SCr of 10.69 mg/dL  (H)). Liver Function Tests:  Recent Labs Lab 09/03/17 2208  AST 16  ALT 14*  ALKPHOS 92  BILITOT 0.6  PROT 7.6  ALBUMIN 3.3*   No results for input(s): LIPASE, AMYLASE in the last 168 hours. No results for input(s): AMMONIA in the last 168 hours. Coagulation Profile:  Recent Labs Lab 09/03/17 2208  INR 2.26   Cardiac Enzymes: No results for input(s): CKTOTAL, CKMB, CKMBINDEX, TROPONINI in the last 168 hours. BNP (last 3 results) No results for input(s): PROBNP in the last 8760 hours. HbA1C: No results for input(s): HGBA1C in the last 72 hours. CBG:  Recent Labs Lab 09/04/17 0804 09/04/17 1139 09/04/17 1311  GLUCAP 158* 163* 161*   Lipid Profile: No results for input(s): CHOL, HDL, LDLCALC, TRIG, CHOLHDL, LDLDIRECT in the last 72 hours. Thyroid Function Tests: No results for input(s): TSH, T4TOTAL, FREET4, T3FREE, THYROIDAB in the last 72 hours. Anemia Panel: No results for input(s): VITAMINB12, FOLATE, FERRITIN, TIBC, IRON, RETICCTPCT in the last 72 hours. Urine analysis: No results found for: COLORURINE, APPEARANCEUR, LABSPEC, PHURINE, GLUCOSEU, HGBUR, BILIRUBINUR, KETONESUR, PROTEINUR, UROBILINOGEN, NITRITE, LEUKOCYTESUR Recent Results (from the past 240 hour(s))  Surgical pcr screen     Status: Abnormal   Collection Time: 09/03/17 11:15 PM  Result Value Ref Range Status   MRSA, PCR NEGATIVE NEGATIVE Final   Staphylococcus aureus POSITIVE (A) NEGATIVE Final    Comment: (NOTE) The Xpert SA Assay (FDA approved for NASAL specimens in patients 81 years of age and older), is one component of a comprehensive surveillance program. It is not intended to diagnose infection nor to guide or monitor treatment.       Radiology Studies: Dg Foot Complete Right  Result Date: 09/03/2017 Please see detailed radiograph report in office note.   Scheduled Meds: . amLODipine  5 mg Oral Daily  . Chlorhexidine Gluconate Cloth  6 each Topical Daily  . cinacalcet  60 mg  Oral Q breakfast  . darbepoetin (ARANESP) injection - DIALYSIS  100 mcg Intravenous Q Thu-HD  . feeding supplement (NEPRO CARB STEADY)  237 mL Oral BID BM  . feeding supplement (PRO-STAT SUGAR FREE 64)  30 mL Oral Daily  . fosamprenavir  700 mg Oral BID  . insulin aspart  0-9 Units Subcutaneous TID WC  . insulin glargine  20 Units Subcutaneous Q2200  . metoprolol tartrate  25 mg Oral BID  . multivitamin  1 tablet Oral QHS  . multivitamin with minerals  1 tablet Oral Daily  . mupirocin ointment  1 application Nasal BID  .  pantoprazole  40 mg Oral BID  . raltegravir  400 mg Oral BID  . ritonavir  100 mg Oral Q breakfast  . sevelamer carbonate  800 mg Oral TID WC   Continuous Infusions: . sodium chloride 10 mL/hr at 09/04/17 1154     LOS: 1 day   Time spent: 25 minutes.  Vance Gather, MD [facility] Pager [phone]  If 7PM-7AM, please contact night-coverage [email or web address] Password [email or web address] 09/04/2017, 4:06 PM

## 2017-09-04 NOTE — Progress Notes (Signed)
Patient arrived to unit per bed.  Reviewed treatment plan and this RN agrees.  Report received from bedside RN, Ellard Artis.  Consent obtained.  Patient A & O X 4. Lung sounds diminished to ausculation in all fields. Generalized non pitting edema. Cardiac: NSR.  Prepped RLAVF with alcohol and cannulated with two 15 gauge needles.  Pulsation of blood noted.  Flushed access well with saline per protocol.  Connected and secured lines and initiated tx at 1528.  UF goal of 2000 mL and net fluid removal of 1500 mL.  Will continue to monitor.

## 2017-09-04 NOTE — Anesthesia Preprocedure Evaluation (Addendum)
Anesthesia Evaluation  Patient identified by MRN, date of birth, ID band Patient awake    Reviewed: Allergy & Precautions, H&P , NPO status , Patient's Chart, lab work & pertinent test results, reviewed documented beta blocker date and time   Airway Mallampati: II  TM Distance: >3 FB Neck ROM: Full    Dental no notable dental hx. (+) Teeth Intact, Dental Advisory Given   Pulmonary neg pulmonary ROS,    Pulmonary exam normal breath sounds clear to auscultation       Cardiovascular hypertension, Pt. on medications and Pt. on home beta blockers  Rhythm:Regular Rate:Normal     Neuro/Psych negative neurological ROS  negative psych ROS   GI/Hepatic Neg liver ROS, GERD  Medicated and Controlled,  Endo/Other  diabetes, Insulin Dependent  Renal/GU ESRF and DialysisRenal disease  negative genitourinary   Musculoskeletal   Abdominal   Peds  Hematology  (+) anemia , HIV,   Anesthesia Other Findings   Reproductive/Obstetrics negative OB ROS                            Anesthesia Physical Anesthesia Plan  ASA: III  Anesthesia Plan: General   Post-op Pain Management:    Induction: Intravenous  PONV Risk Score and Plan: 3 and Ondansetron, Dexamethasone and Midazolam  Airway Management Planned: LMA  Additional Equipment:   Intra-op Plan:   Post-operative Plan: Extubation in OR  Informed Consent: I have reviewed the patients History and Physical, chart, labs and discussed the procedure including the risks, benefits and alternatives for the proposed anesthesia with the patient or authorized representative who has indicated his/her understanding and acceptance.   Dental advisory given  Plan Discussed with: CRNA  Anesthesia Plan Comments:        Anesthesia Quick Evaluation

## 2017-09-04 NOTE — Transfer of Care (Signed)
Immediate Anesthesia Transfer of Care Note  Patient: Thomas [name]  Procedure(s) Performed: Procedure(s): IRRIGATION AND DEBRIDEMENT EXTREMITY (Right) APPLICATION OF WOUND VAC (Right)  Patient Location: PACU  Anesthesia Type:General  Level of Consciousness: drowsy and patient cooperative  Airway & Oxygen Therapy: Patient Spontanous Breathing and Patient connected to face mask oxygen  Post-op Assessment: Report given to RN, Post -op Vital signs reviewed and stable and Patient moving all extremities X 4  Post vital signs: Reviewed and stable  Last Vitals:  Vitals:   09/04/17 0542 09/04/17 0857  BP: (!) 144/83 134/74  Pulse: 87 72  Resp: 18   Temp: 36.9 C   SpO2: 98%     Last Pain:  Vitals:   09/04/17 0542  TempSrc: Oral         Complications: No apparent anesthesia complications

## 2017-09-05 ENCOUNTER — Inpatient Hospital Stay (HOSPITAL_COMMUNITY): Payer: Medicare Other

## 2017-09-05 ENCOUNTER — Telehealth: Payer: Self-pay | Admitting: *Deleted

## 2017-09-05 ENCOUNTER — Encounter (HOSPITAL_COMMUNITY): Payer: Self-pay | Admitting: Podiatry

## 2017-09-05 DIAGNOSIS — L97419 Non-pressure chronic ulcer of right heel and midfoot with unspecified severity: Secondary | ICD-10-CM

## 2017-09-05 DIAGNOSIS — E11621 Type 2 diabetes mellitus with foot ulcer: Secondary | ICD-10-CM

## 2017-09-05 DIAGNOSIS — I96 Gangrene, not elsewhere classified: Secondary | ICD-10-CM

## 2017-09-05 LAB — GLUCOSE, CAPILLARY
GLUCOSE-CAPILLARY: 217 mg/dL — AB (ref 65–99)
Glucose-Capillary: 254 mg/dL — ABNORMAL HIGH (ref 65–99)

## 2017-09-05 MED ORDER — VANCOMYCIN HCL 10 G IV SOLR
2000.0000 mg | Freq: Once | INTRAVENOUS | Status: AC
  Administered 2017-09-05: 2000 mg via INTRAVENOUS
  Filled 2017-09-05: qty 2000

## 2017-09-05 NOTE — Care Management Note (Signed)
Case Management Note  Patient Details  Name: Thomas [name] MRN: [id number] Date of Birth: 1966-06-13  Subjective/Objective:         Presented with R foot wound, hx of atrial fibrillation/ Coumadin, hypertension, diabetes mellitus, SVT, ESRD-HD (TTS), HIV , anemia due to ESRD,  CHF, left BKA. From home with wife. Independent with ADL's. DME : wheelchair.          Caius Silbernagel (Spouse)     [phone]                PCP:  Action/Plan: - s/p Debridement and Irrigation of R Heel (wound 7.5 x 5.5 x 0.6 for a total of 41.25 cm2 debrided)    and  application of wound VAC  09/04/2017  Plan is to d/c to home today with wound vac and home health services in place . Transportation to home will be provided per wife.  Expected Discharge Date:     09/05/2017             Expected Discharge Plan:  [place]  In-House Referral:     Discharge planning Services  CM Consult  Post Acute Care Choice:    Choice offered to:  Patient  DME Arranged:   wound vac DME Agency:  [place] referral made with [facility] @ [phone]  [facility] Arranged:  RN [place]:  [place], referral made with [facility] @ [phone]  Status of Service:  Completed, signed off  If discussed at [place] of Stay Meetings, dates discussed:    Additional Comments:  Sharin Mons, RN 09/05/2017, 10:26 AM

## 2017-09-05 NOTE — Progress Notes (Signed)
DOS;09/04/17 debridement wound right heel.

## 2017-09-05 NOTE — Progress Notes (Signed)
Elizabeth Sauer to be D/C'd Home per MD order.  Discussed with the patient and all questions fully answered.  VSS, Skin clean, dry and intact without evidence of skin break down, no evidence of skin tears noted. IV catheter discontinued intact. Site without signs and symptoms of complications. Dressing and pressure applied.  An After Visit Summary was printed and given to the patient. Patient received prescription.  D/c education completed with patient/family including follow up instructions, medication list, d/c activities limitations if indicated, with other d/c instructions as indicated by MD - patient able to verbalize understanding, all questions fully answered.   Patient instructed to return to ED, call 911, or call MD for any changes in condition.   Patient escorted via [place], and D/C home via private auto.  Dorris Carnes 09/05/2017 3:44 PM

## 2017-09-05 NOTE — Progress Notes (Signed)
[facility] Progress Note   Subjective: Sitting up at bedside. For DC today. Will have HD in-center tomorrow. No C/Os.   Objective Vitals:   09/04/17 1943 09/04/17 1946 09/04/17 2111 09/05/17 0531  BP: 109/62 122/68 118/72 126/82  Pulse: 81 84 93 94  Resp: 18  18 18   Temp: 97.9 F (36.6 C)  98.2 F (36.8 C) 98.8 F (37.1 C)  TempSrc:   Oral Oral  SpO2:   97% 97%  Weight: 122.6 kg (270 lb 4.5 oz)     Height:       Physical Exam General: WN, WD NAD Heart: HS distant S1,S2 regularly irregular Lungs: CTAB A/P Abdomen: Active BS  Extremities: L BKA with prosthesis. R foot with ace wrap-wound vac in place Dialysis Access: RFA AVF + bruit  Additional Objective Labs: Basic Metabolic Panel:  Recent Labs Lab 09/03/17 2208 09/04/17 0452  NA 138 139  K 3.4* 3.4*  CL 93* 95*  CO2 27 28  GLUCOSE 318* 157*  BUN 44* 48*  CREATININE 10.01* 10.69*  CALCIUM 10.3 10.1   Liver Function Tests:  Recent Labs Lab 09/03/17 2208  AST 16  ALT 14*  ALKPHOS 92  BILITOT 0.6  PROT 7.6  ALBUMIN 3.3*   No results for input(s): LIPASE, AMYLASE in the last 168 hours. CBC:  Recent Labs Lab 09/03/17 2208 09/04/17 0452  WBC 8.7 8.5  NEUTROABS 6.4  --   HGB 10.8* 10.9*  HCT 34.0* 32.8*  MCV 98.3 96.5  PLT 242 205   Blood Culture No results found for: SDES, SPECREQUEST, CULT, REPTSTATUS  Cardiac Enzymes: No results for input(s): CKTOTAL, CKMB, CKMBINDEX, TROPONINI in the last 168 hours. CBG:  Recent Labs Lab 09/04/17 0804 09/04/17 1139 09/04/17 1311 09/04/17 2110 09/05/17 0743  GLUCAP 158* 163* 161* 104* 254*   Iron Studies: No results for input(s): IRON, TIBC, TRANSFERRIN, FERRITIN in the last 72 hours. @lablastinr3 @ Studies/Results: Dg Foot Complete Right  Result Date: 09/03/2017 Please see detailed radiograph report in office note.  Medications: . sodium chloride 10 mL/hr at 09/04/17 1154   . amLODipine  5 mg Oral Daily  . Chlorhexidine  Gluconate Cloth  6 each Topical Daily  . cinacalcet  60 mg Oral Q breakfast  . darbepoetin (ARANESP) injection - DIALYSIS  100 mcg Intravenous Q Thu-HD  . feeding supplement (NEPRO CARB STEADY)  237 mL Oral BID BM  . feeding supplement (PRO-STAT SUGAR FREE 64)  30 mL Oral Daily  . fosamprenavir  700 mg Oral BID  . insulin aspart  0-9 Units Subcutaneous TID WC  . insulin glargine  20 Units Subcutaneous Q2200  . metoprolol tartrate  25 mg Oral BID  . multivitamin  1 tablet Oral QHS  . multivitamin with minerals  1 tablet Oral Daily  . mupirocin ointment  1 application Nasal BID  . pantoprazole  40 mg Oral BID  . raltegravir  400 mg Oral BID  . ritonavir  100 mg Oral Q breakfast  . sevelamer carbonate  800 mg Oral TID WC   Dialysis Orders: Center: [facility]  on TTS . EDW 123 kg  HD Bath 3k 2.25 ca   Time 4 hr 99min Heparin none . Access RFA AVF       Hec  9 mcg IV/HD      Mircera 200 mg last given 08/21/17  q 2 wks (due next 09/04/17)    Assessment/Plan: 1. R Foot Wound/Gangrene R heel: Went for I & D yesterday per  ortho with wound vac placement. Possible DC today. DC home on Vanc for 2 weeks-1st dose today.  2. ESRD -T,Th,S. For HD in center tomorrow on schedule 3. Anemia - HGB 10.9. Rec'd Aranesp 100 mcg IV 09/04/17. Follow HGB.  4. Secondary hyperparathyroidism - Ca 10.1 C Ca 10.6 Holding Hectorol. Cont binders, sensipar.   5. HTN/volume - HD on schedule 09/04/17 Pre wt 124.1 kg Net UF 1.5 Post wt 122.6 kg. BP stable throughout tx. Cont. Amlodipine and Metoprolol 6. Nutrition - Albumin 3.3 renal/carb mod diet. Renal Vit/nepro.  7. DM: per primary 8. HIV: meds per primary 9. AFib: Coumadin has been on hold. Per primary    Jimmye Norman. Dereck Agerton NP-C 09/05/2017, 9:21 AM  [employer] [phone]

## 2017-09-05 NOTE — Telephone Encounter (Addendum)
Thomas [name], Nurse Case Manager asked for wound vac orders. Dr. March Rummage ordered wound vac to right foot 150mmHg continuous pressure with foam dressing changes 3 times week.09/12/2017-Pt's wife, Thomas [name] states she does not have the medication Dr. March Rummage ordered on Wednesday, "Saline", and states [place] nurse can place wet to dry dressing until get orders. I review Meds & Orders and there was no orders for Santyl, or change in wound care from 09/10/2017 seen at this time. I told Thomas [name], to have Thomas [name] apply wet to dry dressing until orders were received. Dr. March Rummage ordered wet to dry dressings qod, until Santyl arrived then apply Santyl and wet to dry dressing until 09/18/2107, wound vac to be applied 164mmHg continuous pressure with change 3 times a week with black foam. Orders faxed to [phone]. Faxed required for Santyl to [place].09/15/2017-Pt states he can not afford the Santyl, and wanted to know the wound care instructions. I checked with Dr. March Rummage and he stated wet to dry dressings until Wednesday check and I informed pt.

## 2017-09-05 NOTE — Discharge Summary (Signed)
Physician Discharge Summary  [name] Surgeon KDT:[id number] DOB: 1966-01-29 DOA: 09/03/2017  PCP: Patient, No Pcp Per  Admit date: 09/03/2017 Discharge date: 09/05/2017  Admitted From: Home Disposition: Home   Recommendations for Outpatient Follow-up:  1. Follow up with PCP for ongoing diabetes care 2. Follow up with podiatry, Dr. March Rummage 9/12.  3. Continue vancomycin with HD for 2 weeks per Dr. March Rummage recommendation.   4. Please obtain BMP/CBC in one week 5. Please follow up on the following pending results: blood cultures (NGTD at discharge)  Home Health: RN Equipment/Devices: Wound vac Discharge Condition: Stable CODE STATUS: Full Diet recommendation: Renal, heart healthy, carbohydrate modified.  Brief/Interim Summary: Thomas [name] is a 51 y.o. male with a history of AFib on coumadin, IDDM, HTN, ESRD (TTS), HIV (CD4 310 May 2018), chronic combined CHF, and PVD s/p left BKA who was sent to the hospital by his podiatrist, Dr. March Rummage, for wet gangrene of the right foot. He underwent I&D of right heel wound by Dr. March Rummage 9/6. HD was performed by nephrology 9/6, and plan is to DC 9/7 with wound vac. Vnacomycin was started and is planned to be given with HD for 2 weeks. He will follow up with Dr. March Rummage as scheduled next week.   Discharge Diagnoses:  Principal Problem:   Diabetic foot ulcer ([place]) Active Problems:   ESRD on dialysis ([place])   Type 2 diabetes mellitus with complication (HCC)   HIV disease ([place])   Anemia due to end stage renal disease ([place])   PAF (paroxysmal atrial fibrillation) ([place])   Essential hypertension   GERD (gastroesophageal reflux disease)   Hypokalemia   Diabetic wet gangrene of the foot ([place])  Diabetic foot ulcer with wet gangrene: Hemodynamically stable, without sepsis. s/p I&D with wound vac placement 9/6 by Dr. March Rummage. - Will give vancomycin w/HD x2 weeks; this was communicated with nephrology, Juanell Fairly.  - Monitoring blood cultures, no intraoperative cultures  taken. - HH-RN to be arranged prior to discharge for home wound vac.   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  - HD performed 9/6 per routine, plan to continue per routine 9/8 in-center.  - Continue Sensipar, Renvela and Hectorol  DXI:[id number] Score is 3, he's on coumadin and in NSR.  - Restart coumadin postop. INR 2.26 on admission.  - Continue metoprolol  Uncontrolled IDT2DM:Last A1c 11.7% on 05/05/17.  - At inpatient goal on reduced dose lantus. Restart home medications at discharge.  - Recommend PCP follow up.  Anemia of chronic disease: Hgb at baseline, 10.8 > 10.9.  - Monitor CBC at follow up  HIV:CD4 310on 05/05/17, viral load 30 on 03/10/17 - Continue home ART  Hypokalemia: K 3.4 - Tx with HD.  Discharge Instructions Discharge Instructions    Diet - low sodium heart healthy    Complete by:  As directed    Diet Carb Modified    Complete by:  As directed    Discharge instructions    Complete by:  As directed    - Take tylenol as needed for pain - Follow up with Dr. March Rummage Wednesday - Continue hemodialysis per routine, you should receive vancomycin, an antibiotic, with dialysis for the next 2 weeks.  - If you notice a fever, redness around the site, increased pain, or pus coming in the wound vac, seek medical attention right away.   Increase activity slowly    Complete by:  As directed      Allergies as  of 09/05/2017   No Known Allergies     Medication List    TAKE these medications   amLODipine 5 MG tablet Commonly known as:  NORVASC Take 1 tablet (5 mg total) by mouth daily.   cinacalcet 60 MG tablet Commonly known as:  SENSIPAR Take 60 mg by mouth daily.   doxercalciferol 4 MCG/2ML injection Commonly known as:  HECTOROL Inject 2.5 mLs (5 mcg total) into the vein Every Tuesday,Thursday,and Saturday with dialysis.   ferric gluconate 125 mg in sodium chloride 0.9 % 100 mL Inject 125 mg into the vein Every  Tuesday,Thursday,and Saturday with dialysis.   ferrous sulfate 325 (65 FE) MG tablet Take 325 mg by mouth 3 (three) times daily with meals.   fosamprenavir 700 MG tablet Commonly known as:  LEXIVA Take 700 mg by mouth 2 (two) times daily.   insulin aspart 100 UNIT/ML injection Commonly known as:  novoLOG CBG 70 - 120: 0 units CBG 121 - 150: 3 units CBG 151 - 200: 4 units CBG 201 - 250: 7 units CBG 251 - 300: 11 units CBG 301 - 350: 15 units CBG 351 - 400: 20 units What changed:  how much to take  how to take this  when to take this  additional instructions   insulin glargine 100 UNIT/ML injection Commonly known as:  LANTUS Inject 0.3 mLs (30 Units total) into the skin daily at 10 pm.   metoprolol tartrate 25 MG tablet Commonly known as:  LOPRESSOR Take 1 tablet (25 mg total) by mouth 2 (two) times daily.   pantoprazole 40 MG tablet Commonly known as:  PROTONIX Take 1 tablet (40 mg total) by mouth 2 (two) times daily.   raltegravir 400 MG tablet Commonly known as:  ISENTRESS Take 400 mg by mouth 2 (two) times daily.   ritonavir 100 MG capsule Commonly known as:  NORVIR Take 100 mg by mouth daily with breakfast.   sevelamer carbonate 800 MG tablet Commonly known as:  RENVELA Take 2,400 mg by mouth 3 (three) times daily with meals.   TAB-A-VITE PO Take 1 tablet by mouth daily.   warfarin 10 MG tablet Commonly known as:  COUMADIN Take 10 mg by mouth See admin instructions. Take 10 mg by mouth daily Sunday, Monday, Wednesday and Friday   warfarin 7.5 MG tablet Commonly known as:  COUMADIN Take 7.5 mg by mouth See admin instructions. Take 7.5 mg by mouth daily on Tuesday, Thursday ans Saturday            Discharge Care Instructions        Start     Ordered   09/05/17 0000  Increase activity slowly     09/05/17 1036   09/05/17 0000  Diet - low sodium heart healthy     09/05/17 1036   09/05/17 0000  Diet Carb Modified     09 /07/18 1036   09/05/17 0000   Discharge instructions    Comments:  - Take tylenol as needed for pain - Follow up with Dr. March Rummage Wednesday - Continue hemodialysis per routine, you should receive vancomycin, an antibiotic, with dialysis for the next 2 weeks.  - If you notice a fever, redness around the site, increased pain, or pus coming in the wound vac, seek medical attention right away.   09/05/17 1036     Follow-up Information    [name], DPM. Go on 09/10/2017.   Specialty:  Podiatry Why:  2:15pm Contact information: [address]  [place] [address] [phone]        Carlyle Basques, MD Follow up.   Specialty:  Infectious Diseases Contact information: [place] [address] [phone]          No Known Allergies  Consultations:  Podiatry, Dr. March Rummage  Nephrology, Dr. Jonnie Finner  Procedures/Studies: Dg Foot Complete Right  Result Date: 09/03/2017 Please see detailed radiograph report in office note.  Subjective: Feels well, wants to go home. No pain, has taken no pain medications.  Discharge Exam: Vitals:   09/05/17 0531 09/05/17 1006  BP: 126/82 118/71  Pulse: 94   Resp: 18   Temp: 98.8 F (37.1 C)   SpO2: 97%    General: Pt is alert, awake, not in acute distress Cardiovascular: RRR, S1/S2 +, no rubs, no gallops Respiratory: CTA bilaterally, no wheezing, no rhonchi Abdominal: Soft, NT, ND, bowel sounds + Extremities: Right foot with ACE wrap, toes with fair sensation (at baseline), normal motion, brisk cap refill. Wound vac with serosanguinous drainage. Right forearm AVF +bruit, no bleeding or discharge.  Labs: Basic Metabolic Panel:  Recent Labs Lab 09/03/17 2208 09/04/17 0452  NA 138 139  K 3.4* 3.4*  CL 93* 95*  CO2 27 28  GLUCOSE 318* 157*  BUN 44* 48*  CREATININE 10.01* 10.69*  CALCIUM 10.3 10.1   Liver Function Tests:  Recent Labs Lab 09/03/17 2208  AST 16  ALT 14*  ALKPHOS 92  BILITOT 0.6  PROT 7.6  ALBUMIN  3.3*   CBC:  Recent Labs Lab 09/03/17 2208 09/04/17 0452  WBC 8.7 8.5  NEUTROABS 6.4  --   HGB 10.8* 10.9*  HCT 34.0* 32.8*  MCV 98.3 96.5  PLT 242 205   CBG:  Recent Labs Lab 09/04/17 0804 09/04/17 1139 09/04/17 1311 09/04/17 2110 09/05/17 0743  GLUCAP 158* 163* 161* 104* 254*   Microbiology Recent Results (from the past 240 hour(s))  Surgical pcr screen     Status: Abnormal   Collection Time: 09/03/17 11:15 PM  Result Value Ref Range Status   MRSA, PCR NEGATIVE NEGATIVE Final   Staphylococcus aureus POSITIVE (A) NEGATIVE Final    Comment: (NOTE) The Xpert SA Assay (FDA approved for NASAL specimens in patients 6 years of age and older), is one component of a comprehensive surveillance program. It is not intended to diagnose infection nor to guide or monitor treatment.     Time coordinating discharge: Approximately 40 minutes  Vance Gather, MD  [facility] 09/05/2017, 10:36 AM Pager [phone]

## 2017-09-05 NOTE — Progress Notes (Signed)
Inpatient Diabetes Program Recommendations  AACE/ADA: New Consensus Statement on Inpatient Glycemic Control (2015)  Target Ranges:  Prepandial:   less than 140 mg/dL      Peak postprandial:   less than 180 mg/dL (1-2 hours)      Critically ill patients:  140 - 180 mg/dL   Lab Results  Component Value Date   GLUCAP 254 (H) 09/05/2017   HGBA1C 11.7 (H) 05/05/2017   Review of Glycemic Control  Diabetes history: Type 2 Outpatient Diabetes medications: Levemir 30 units qhs, Novolog 0-20 units tid Current orders for Inpatient glycemic control: Lantus 20 units qhs, Novolog 0-9 units tid  Consult for lower extremity wound  Inpatient Diabetes Program Recommendations:     Per ADA recommendations "consider performing an A1C on all patients with diabetes or hyperglycemia admitted to the hospital if not performed in the prior 3 months".  Thanks,  Tama Headings RN, MSN, [facility] Inpatient Diabetes Coordinator Team Pager [phone] (8a-5p)

## 2017-09-05 NOTE — Progress Notes (Signed)
Pharmacy Antibiotic Note  Thomas [name] is a 51 y.o. male admitted on 09/03/2017 with diabetic foot ulcer s/p I&D and wound vac placement on 9/6.Marland Kitchen  Pharmacy has been consulted for Vancomycin dosing.  Plan for 2 weeks of therapy to continue with outpt dialysis.  Plan: Vancomycin 2gm IV x 1 dose (loading dose) Anticipate discharge today, 9/7. Further Vancomycin doses per HD center.  Would recommend 1gm IV qHD.  Height: 6\' 6"  (198.1 cm) Weight: 270 lb 4.5 oz (122.6 kg) IBW/kg (Calculated) : 91.4  Temp (24hrs), Avg:97.9 F (36.6 C), Min:97.2 F (36.2 C), Max:98.8 F (37.1 C)   Recent Labs Lab 09/03/17 2208 09/04/17 0452  WBC 8.7 8.5  CREATININE 10.01* 10.69*    Estimated Creatinine Clearance: 12 mL/min (A) (by C-G formula based on SCr of 10.69 mg/dL (H)).    No Known Allergies  Antimicrobials this admission: Vanc 9/7 >>  Dose adjustments this admission:   Microbiology results: 9/5 blood x 2 > ngtd 9/5 MRSA PCR neg, SA PCR pos  Thank you for allowing pharmacy to be a part of this patient's care.  [employer], Pharm.D., BCPS Clinical Pharmacist Pager: [phone] Clinical phone for 09/05/2017 from 8:30-4:00 is [phone]. After 4pm, please call Main Rx [phone]) for assistance. 09/05/2017 11:38 AM

## 2017-09-05 NOTE — Progress Notes (Signed)
VASCULAR LAB PRELIMINARY  ARTERIAL  ABI completed:    RIGHT    LEFT    PRESSURE WAVEFORM  PRESSURE WAVEFORM  BRACHIAL  AVF BRACHIAL 178 Triphasic  DP   DP    AT 176 triphasic AT    PT 189 biphasic PT    PER   PER    GREAT TOE  NA GREAT TOE  NA    RIGHT LEFT  ABI 1.0 Amputation     Arletha Marschke, Bonnye Fava, RVT 09/05/2017, 10:58 AM

## 2017-09-05 NOTE — Progress Notes (Signed)
  Subjective:  Patient ID: Thomas [name], male    DOB: 05-24-1966,  MRN: [id number]  Patient seen at bedside. Resting comfortably without pain. Denies issues this AM. Objective:   Vitals:   09/04/17 2111 09/05/17 0531  BP: 118/72 126/82  Pulse: 93 94  Resp: 18 18  Temp: 98.2 F (36.8 C) 98.8 F (37.1 C)  SpO2: 97% 97%   General AA&O x3. Normal mood and affect.  Vascular Foot warm.  Neurologic Epicritic sensation grossly absent.  Dermatologic Dressing c/d/i. Wound VAC in place. ~25 ccs of SS drainage in cannister  Orthopedic: No tenderness to palpation about the foot. No pain on palpation of the calf.   Assessment & Plan:  Patient was evaluated and treated and all questions answered.  R Heel Wound D&I  -VSSAF -S/p D&I. Wound VAC in place. -No intra-operative cultures taken. -Patient ok for discharge from podiatric standpoint. Request patient be discharged with home health for Wound VAC changes. -Recommend 2 weeks of abx coverage for local ST infection. Consider Vancomycin with dialysis. -Patient has appt for f/u next Wednesday.

## 2017-09-09 LAB — CULTURE, BLOOD (ROUTINE X 2)
Culture: NO GROWTH
Culture: NO GROWTH

## 2017-09-10 ENCOUNTER — Ambulatory Visit (INDEPENDENT_AMBULATORY_CARE_PROVIDER_SITE_OTHER): Payer: Medicare Other | Admitting: Podiatry

## 2017-09-10 ENCOUNTER — Encounter: Payer: Self-pay | Admitting: Podiatry

## 2017-09-10 DIAGNOSIS — L97412 Non-pressure chronic ulcer of right heel and midfoot with fat layer exposed: Secondary | ICD-10-CM | POA: Diagnosis not present

## 2017-09-10 NOTE — Progress Notes (Signed)
   Subjective:    Patient ID: Thomas [name], male    DOB: 09-Oct-1966, 51 y.o.   MRN: [id number]  HPI Chief Complaint  Patient presents with  . Routine Post Op    i had surgery on my right foot and i am doing good    51 y.o. male returns for the above complaint. States the wound has been doing better. Has wound VAC on today that has been applied by [facility]. Denies N/V/F/Ch.  Review of Systems    Objective:   Physical Exam There were no vitals filed for this visit. General AA&O x3. Normal mood and affect.  Vascular Dorsalis pedis and posterior tibial pulses  barely palpable right  Capillary refill normal to all digits. Pedal hair growth absent.  Neurologic Epicritic sensation grossly present. Protective sensation absent  Dermatologic (Wound) Wound Location: R heel Wound Measurement: 6 x 5.5 x 0.3 Wound Base: Mixed Granular/Fibrotic Peri-wound: Macerated Exudate: Scant/small amount Serosanguinous exudate Wound progress: Improved since last check.   Orthopedic: MMT 5/5 in dorsiflexion, plantarflexion, inversion, and eversion. Normal lower extremity joint ROM without pain or crepitus.     Assessment & Plan:  R Heel Ulceration -Wound improvement noted. Significant coverage garnered from [facility]. -Wound today with significant fibrotic tissue. -Orders written for [facility]. Will skip VAC application, apply Santyl WTD daily to remove slough. Resume VAC later when wound more granular. -Anticipate only a few more VAC applications.

## 2017-09-11 ENCOUNTER — Inpatient Hospital Stay: Payer: Medicare Other

## 2017-09-12 MED ORDER — COLLAGENASE 250 UNIT/GM EX OINT: 1.0000 "application " | TOPICAL_OINTMENT | Freq: Every day | CUTANEOUS | 6 refills | Status: DC

## 2017-09-15 ENCOUNTER — Telehealth: Payer: Self-pay | Admitting: Podiatry

## 2017-09-15 NOTE — Telephone Encounter (Signed)
Terran with [facility] called stating the pt's co-payment for the Santyl is $600.00. I told her I spoke with Marcy Siren, RN and that she is going to talk to Dr. March Rummage about getting an alternative medication as that is too much. Marily Memos stated she would put it on hold and wait to hear back from [place].

## 2017-09-15 NOTE — Telephone Encounter (Signed)
[facility] states pt's co-pay for the Santyl is $600.00.

## 2017-09-17 ENCOUNTER — Ambulatory Visit (INDEPENDENT_AMBULATORY_CARE_PROVIDER_SITE_OTHER): Payer: Medicare Other | Admitting: Podiatry

## 2017-09-17 ENCOUNTER — Telehealth: Payer: Self-pay | Admitting: Podiatry

## 2017-09-17 ENCOUNTER — Encounter: Payer: Self-pay | Admitting: Podiatry

## 2017-09-17 VITALS — BP 122/77 | HR 94 | Temp 98.2°F | Resp 16

## 2017-09-17 DIAGNOSIS — L97412 Non-pressure chronic ulcer of right heel and midfoot with fat layer exposed: Secondary | ICD-10-CM | POA: Diagnosis not present

## 2017-09-17 NOTE — Telephone Encounter (Signed)
Thomas [name] with [facility] called and stated that pt's wife called and stated "per Dr. March Rummage we are discontinuing the wound vac and home health". Thomas [name] stated they are not the one's discontinuing it but that is what the pt's wife said that Dr. March Rummage said. Any questions, please call [phone].

## 2017-09-17 NOTE — Patient Instructions (Signed)
Pre-Operative Instructions  Congratulations, you have decided to take an important step towards improving your quality of life.  You can be assured that the doctors and staff at [facility] will be with you every step of the way.  Here are some important things you should know:  1. Plan to be at the surgery center/hospital at least 1 (one) hour prior to your scheduled time, unless otherwise directed by the surgical center/hospital staff.  You must have a responsible adult accompany you, remain during the surgery and drive you home.  Make sure you have directions to the surgical center/hospital to ensure you arrive on time. 2. If you are having surgery at [facility] or  hospitals, you will need a copy of your medical history and physical form from your family physician within one month prior to the date of surgery. We will give you a form for your primary physician to complete.  3. We make every effort to accommodate the date you request for surgery.  However, there are times where surgery dates or times have to be moved.  We will contact you as soon as possible if a change in schedule is required.   4. No aspirin/ibuprofen for one week before surgery.  If you are on aspirin, any non-steroidal anti-inflammatory medications (Mobic, Aleve, Ibuprofen) should not be taken seven (7) days prior to your surgery.  You make take Tylenol for pain prior to surgery.  5. Medications - If you are taking daily heart and blood pressure medications, seizure, reflux, allergy, asthma, anxiety, pain or diabetes medications, make sure you notify the surgery center/hospital before the day of surgery so they can tell you which medications you should take or avoid the day of surgery. 6. No food or drink after midnight the night before surgery unless directed otherwise by surgical center/hospital staff. 7. No alcoholic beverages 24-hours prior to surgery.  No smoking 24-hours prior or 24-hours after  surgery. 8. Wear loose pants or shorts. They should be loose enough to fit over bandages, boots, and casts. 9. Don't wear slip-on shoes. Sneakers are preferred. 10. Bring your boot with you to the surgery center/hospital.  Also bring crutches or a walker if your physician has prescribed it for you.  If you do not have this equipment, it will be provided for you after surgery. 11. If you have not been contacted by the surgery center/hospital by the day before your surgery, call to confirm the date and time of your surgery. 12. Leave-time from work may vary depending on the type of surgery you have.  Appropriate arrangements should be made prior to surgery with your employer. 13. Prescriptions will be provided immediately following surgery by your doctor.  Fill these as soon as possible after surgery and take the medication as directed. Pain medications will not be refilled on weekends and must be approved by the doctor. 14. Remove nail polish on the operative foot and avoid getting pedicures prior to surgery. 15. Wash the night before surgery.  The night before surgery wash the foot and leg well with water and the antibacterial soap provided. Be sure to pay special attention to beneath the toenails and in between the toes.  Wash for at least three (3) minutes. Rinse thoroughly with water and dry well with a towel.  Perform this wash unless told not to do so by your physician.  Enclosed: 1 Ice pack (please put in freezer the night before surgery)   1 Hibiclens skin cleaner     Pre-op instructions  If you have any questions regarding the instructions, please do not hesitate to call our office.  [facility] [place]: [address], [place], [place] [address] -- [phone]  [place]: [address] [place], [place] [address] -- [phone]  [place]: [address]  [place], [place] [address] -- [phone]  [place]: [address], [address], [place], [place] [address] -- [phone]  Website:  [email or web address] 

## 2017-09-19 ENCOUNTER — Telehealth: Payer: Self-pay | Admitting: *Deleted

## 2017-09-19 NOTE — Telephone Encounter (Signed)
"  I'm calling about Thomas [name].  He's on the schedule for Wednesday with Dr. March Rummage.  He had an echocardiogram in May.  His ejection fraction was 35-40%.  They have to have an ejection fraction greater than 40% to have it done at the [facility].  He will have to be done at the main OR.  "I think you got a call from Juliann Pulse, another call Nurse, calling about Thomas [name] for Wednesday.  I still see he's still on the grid.  Juliann Pulse called you yesterday regarding taking him off the schedule due to ejection fraction level.  Please give me a call back."

## 2017-09-22 NOTE — Telephone Encounter (Signed)
I called and rescheduled patient's surgery from [place] to [place].  Start time will be 11:15 am.

## 2017-09-22 NOTE — Telephone Encounter (Signed)
I called and informed the patient of the change in location from [place] to [place].  I explained to him that due to his health issues the surgery cannot be done at [facility].  I informed him that surgery will start at 11:15 am but he will probably have to be there two hours earlier.

## 2017-09-23 ENCOUNTER — Telehealth: Payer: Self-pay | Admitting: *Deleted

## 2017-09-23 NOTE — Telephone Encounter (Signed)
"  He is to come in to have surgery tomorrow with Dr. March Rummage.  We do not have orders entered into EPIC.  So we can't do his consent or labs or anything until we get orders entered into EPIC.  So, I'm calling to request that Dr. March Rummage or someone puts his orders in if possible this afternoon.  If not possible, at least by tomorrow morning.  Thank you."

## 2017-09-23 NOTE — Progress Notes (Signed)
Poor phone reception,gave pt time,npo and meds to take.He stated he would be here at 0850.

## 2017-09-23 NOTE — Telephone Encounter (Signed)
Orders placed.

## 2017-09-23 NOTE — Progress Notes (Signed)
Called and spoke with patient. He could not here me and there was a poor signal as he was in dialysis and requested someone call him after 3 pm today.

## 2017-09-24 ENCOUNTER — Encounter (HOSPITAL_COMMUNITY)
Admission: RE | Disposition: A | Discharge status: Home / Self Care | Payer: Self-pay | Source: Ambulatory Visit | Attending: Podiatry

## 2017-09-24 ENCOUNTER — Ambulatory Visit (HOSPITAL_COMMUNITY): Payer: Medicare Other | Admitting: Certified Registered"

## 2017-09-24 ENCOUNTER — Ambulatory Visit (HOSPITAL_COMMUNITY)
Admission: RE | Admit: 2017-09-24 | Discharge: 2017-09-24 | Disposition: A | Discharge status: Home / Self Care | Payer: Medicare Other | Source: Ambulatory Visit | Attending: Podiatry | Admitting: Podiatry

## 2017-09-24 DIAGNOSIS — Z8249 Family history of ischemic heart disease and other diseases of the circulatory system: Secondary | ICD-10-CM | POA: Diagnosis not present

## 2017-09-24 DIAGNOSIS — I471 Supraventricular tachycardia: Secondary | ICD-10-CM | POA: Diagnosis not present

## 2017-09-24 DIAGNOSIS — Z9889 Other specified postprocedural states: Secondary | ICD-10-CM | POA: Insufficient documentation

## 2017-09-24 DIAGNOSIS — J811 Chronic pulmonary edema: Secondary | ICD-10-CM | POA: Insufficient documentation

## 2017-09-24 DIAGNOSIS — Z7901 Long term (current) use of anticoagulants: Secondary | ICD-10-CM | POA: Insufficient documentation

## 2017-09-24 DIAGNOSIS — N186 End stage renal disease: Secondary | ICD-10-CM | POA: Insufficient documentation

## 2017-09-24 DIAGNOSIS — L97415 Non-pressure chronic ulcer of right heel and midfoot with muscle involvement without evidence of necrosis: Secondary | ICD-10-CM | POA: Insufficient documentation

## 2017-09-24 DIAGNOSIS — Z794 Long term (current) use of insulin: Secondary | ICD-10-CM | POA: Diagnosis not present

## 2017-09-24 DIAGNOSIS — Z992 Dependence on renal dialysis: Secondary | ICD-10-CM | POA: Diagnosis not present

## 2017-09-24 DIAGNOSIS — K219 Gastro-esophageal reflux disease without esophagitis: Secondary | ICD-10-CM | POA: Diagnosis not present

## 2017-09-24 DIAGNOSIS — E1122 Type 2 diabetes mellitus with diabetic chronic kidney disease: Secondary | ICD-10-CM | POA: Diagnosis not present

## 2017-09-24 DIAGNOSIS — I120 Hypertensive chronic kidney disease with stage 5 chronic kidney disease or end stage renal disease: Secondary | ICD-10-CM | POA: Insufficient documentation

## 2017-09-24 DIAGNOSIS — D631 Anemia in chronic kidney disease: Secondary | ICD-10-CM | POA: Diagnosis not present

## 2017-09-24 DIAGNOSIS — E11621 Type 2 diabetes mellitus with foot ulcer: Secondary | ICD-10-CM | POA: Insufficient documentation

## 2017-09-24 DIAGNOSIS — I480 Paroxysmal atrial fibrillation: Secondary | ICD-10-CM | POA: Insufficient documentation

## 2017-09-24 DIAGNOSIS — B20 Human immunodeficiency virus [HIV] disease: Secondary | ICD-10-CM | POA: Diagnosis not present

## 2017-09-24 DIAGNOSIS — Z79899 Other long term (current) drug therapy: Secondary | ICD-10-CM | POA: Insufficient documentation

## 2017-09-24 DIAGNOSIS — Z833 Family history of diabetes mellitus: Secondary | ICD-10-CM | POA: Insufficient documentation

## 2017-09-24 DIAGNOSIS — L97419 Non-pressure chronic ulcer of right heel and midfoot with unspecified severity: Secondary | ICD-10-CM | POA: Diagnosis present

## 2017-09-24 DIAGNOSIS — L97412 Non-pressure chronic ulcer of right heel and midfoot with fat layer exposed: Secondary | ICD-10-CM

## 2017-09-24 HISTORY — PX: WOUND DEBRIDEMENT: SHX247

## 2017-09-24 LAB — HEMOGLOBIN A1C
Hgb A1c MFr Bld: 7.7 % — ABNORMAL HIGH (ref 4.8–5.6)
Mean Plasma Glucose: 174.29 mg/dL

## 2017-09-24 LAB — POCT I-STAT 4, (NA,K, GLUC, HGB,HCT)
Glucose, Bld: 226 mg/dL — ABNORMAL HIGH (ref 65–99)
HEMATOCRIT: 33 % — AB (ref 39.0–52.0)
Hemoglobin: 11.2 g/dL — ABNORMAL LOW (ref 13.0–17.0)
Potassium: 5 mmol/L (ref 3.5–5.1)
Sodium: 139 mmol/L (ref 135–145)

## 2017-09-24 LAB — GLUCOSE, CAPILLARY: Glucose-Capillary: 146 mg/dL — ABNORMAL HIGH (ref 65–99)

## 2017-09-24 LAB — PROTIME-INR
INR: 2.74
Prothrombin Time: 28.8 seconds — ABNORMAL HIGH (ref 11.4–15.2)

## 2017-09-24 SURGERY — DEBRIDEMENT, WOUND
Anesthesia: General

## 2017-09-24 MED ORDER — BUPIVACAINE HCL (PF) 0.5 % IJ SOLN
INTRAMUSCULAR | Status: AC
  Filled 2017-09-24: qty 30

## 2017-09-24 MED ORDER — 0.9 % SODIUM CHLORIDE (POUR BTL) OPTIME
TOPICAL | Status: DC | PRN
  Administered 2017-09-24: 1000 mL

## 2017-09-24 MED ORDER — VANCOMYCIN HCL 1000 MG IV SOLR
INTRAVENOUS | Status: DC | PRN
  Administered 2017-09-24: 1000 mg

## 2017-09-24 MED ORDER — PROPOFOL 10 MG/ML IV BOLUS
INTRAVENOUS | Status: DC | PRN
  Administered 2017-09-24 (×5): 30 mg via INTRAVENOUS
  Administered 2017-09-24: 50 mg via INTRAVENOUS
  Administered 2017-09-24 (×4): 30 mg via INTRAVENOUS

## 2017-09-24 MED ORDER — LIDOCAINE 2% (20 MG/ML) 5 ML SYRINGE
INTRAMUSCULAR | Status: AC
  Filled 2017-09-24: qty 5

## 2017-09-24 MED ORDER — FENTANYL CITRATE (PF) 250 MCG/5ML IJ SOLN
INTRAMUSCULAR | Status: DC | PRN
  Administered 2017-09-24: 50 ug via INTRAVENOUS

## 2017-09-24 MED ORDER — ONDANSETRON HCL 4 MG/2ML IJ SOLN
INTRAMUSCULAR | Status: AC
  Filled 2017-09-24: qty 2

## 2017-09-24 MED ORDER — MIDAZOLAM HCL 2 MG/2ML IJ SOLN
INTRAMUSCULAR | Status: AC
  Filled 2017-09-24: qty 2

## 2017-09-24 MED ORDER — MIDAZOLAM HCL 5 MG/5ML IJ SOLN
INTRAMUSCULAR | Status: DC | PRN
  Administered 2017-09-24: 2 mg via INTRAVENOUS

## 2017-09-24 MED ORDER — HYDROMORPHONE HCL 1 MG/ML IJ SOLN: 0.2500 mg | INTRAMUSCULAR | Status: DC | PRN

## 2017-09-24 MED ORDER — PROPOFOL 10 MG/ML IV BOLUS
INTRAVENOUS | Status: AC
  Filled 2017-09-24: qty 20

## 2017-09-24 MED ORDER — FENTANYL CITRATE (PF) 250 MCG/5ML IJ SOLN
INTRAMUSCULAR | Status: AC
  Filled 2017-09-24: qty 5

## 2017-09-24 MED ORDER — ONDANSETRON HCL 4 MG/2ML IJ SOLN
INTRAMUSCULAR | Status: DC | PRN
  Administered 2017-09-24: 4 mg via INTRAVENOUS

## 2017-09-24 MED ORDER — SODIUM CHLORIDE 0.9 % IV SOLN
INTRAVENOUS | Status: DC
  Administered 2017-09-24: 09:00:00 via INTRAVENOUS

## 2017-09-24 MED ORDER — PROPOFOL 1000 MG/100ML IV EMUL
INTRAVENOUS | Status: AC
  Filled 2017-09-24: qty 100

## 2017-09-24 MED ORDER — CEFAZOLIN SODIUM-DEXTROSE 2-4 GM/100ML-% IV SOLN
INTRAVENOUS | Status: DC
Start: 2017-09-24 — End: 2017-09-24
  Filled 2017-09-24: qty 100

## 2017-09-24 MED ORDER — OXYCODONE-ACETAMINOPHEN 5-325 MG PO TABS: 1.0000 | ORAL_TABLET | ORAL | 0 refills | Status: DC | PRN

## 2017-09-24 MED ORDER — DEXTROSE 5 % IV SOLN
3.0000 g | INTRAVENOUS | Status: AC
  Administered 2017-09-24: 3 g via INTRAVENOUS
  Filled 2017-09-24: qty 3000

## 2017-09-24 MED ORDER — CHLORHEXIDINE GLUCONATE 4 % EX LIQD
60.0000 mL | Freq: Once | CUTANEOUS | Status: DC
Start: 2017-09-24 — End: 2017-09-24

## 2017-09-24 MED ORDER — VANCOMYCIN HCL 1000 MG IV SOLR
INTRAVENOUS | Status: AC
  Filled 2017-09-24: qty 1000

## 2017-09-24 MED ORDER — BUPIVACAINE HCL (PF) 0.5 % IJ SOLN
INTRAMUSCULAR | Status: DC | PRN
  Administered 2017-09-24: 20 mL

## 2017-09-24 SURGICAL SUPPLY — 37 items
BANDAGE ACE 4X5 VEL STRL LF (GAUZE/BANDAGES/DRESSINGS) IMPLANT
BANDAGE ACE 6X5 VEL STRL LF (GAUZE/BANDAGES/DRESSINGS) ×2 IMPLANT
BNDG ESMARK 4X9 LF (GAUZE/BANDAGES/DRESSINGS) IMPLANT
BNDG GAUZE ELAST 4 BULKY (GAUZE/BANDAGES/DRESSINGS) ×4 IMPLANT
CHLORAPREP W/TINT 26ML (MISCELLANEOUS) ×2 IMPLANT
COVER SURGICAL LIGHT HANDLE (MISCELLANEOUS) ×2 IMPLANT
CUFF TOURNIQUET SINGLE 18IN (TOURNIQUET CUFF) IMPLANT
CUFF TOURNIQUET SINGLE 34IN LL (TOURNIQUET CUFF) IMPLANT
DRAPE U-SHAPE 47X51 STRL (DRAPES) ×2 IMPLANT
ELECT CAUTERY BLADE 6.4 (BLADE) ×2 IMPLANT
ELECT REM PT RETURN 9FT ADLT (ELECTROSURGICAL) ×2
ELECTRODE REM PT RTRN 9FT ADLT (ELECTROSURGICAL) ×1 IMPLANT
GAUZE SPONGE 4X4 12PLY STRL (GAUZE/BANDAGES/DRESSINGS) IMPLANT
GLOVE BIO SURGEON STRL SZ7.5 (GLOVE) ×2 IMPLANT
GLOVE BIOGEL PI IND STRL 8 (GLOVE) ×1 IMPLANT
GLOVE BIOGEL PI INDICATOR 8 (GLOVE) ×1
GOWN STRL REUS W/ TWL LRG LVL3 (GOWN DISPOSABLE) ×1 IMPLANT
GOWN STRL REUS W/ TWL XL LVL3 (GOWN DISPOSABLE) ×1 IMPLANT
GOWN STRL REUS W/TWL LRG LVL3 (GOWN DISPOSABLE) ×1
GOWN STRL REUS W/TWL XL LVL3 (GOWN DISPOSABLE) ×1
KIT BASIN OR (CUSTOM PROCEDURE TRAY) ×2 IMPLANT
KIT ROOM TURNOVER OR (KITS) ×2 IMPLANT
MANIFOLD NEPTUNE II (INSTRUMENTS) ×2 IMPLANT
MATRIX TISSUE MESHED BI 4X10 (Tissue) ×2 IMPLANT
NEEDLE HYPO 25GX1X1/2 BEV (NEEDLE) IMPLANT
NS IRRIG 1000ML POUR BTL (IV SOLUTION) ×2 IMPLANT
PACK ORTHO EXTREMITY (CUSTOM PROCEDURE TRAY) ×2 IMPLANT
PAD ARMBOARD 7.5X6 YLW CONV (MISCELLANEOUS) ×4 IMPLANT
SCRUB BETADINE 4OZ XXX (MISCELLANEOUS) ×2 IMPLANT
SET CYSTO W/LG BORE CLAMP LF (SET/KITS/TRAYS/PACK) ×2 IMPLANT
SOL PREP POV-IOD 4OZ 10% (MISCELLANEOUS) ×2 IMPLANT
SUT CHROMIC 4 0 SH 27 (SUTURE) ×4 IMPLANT
SYR CONTROL 10ML LL (SYRINGE) IMPLANT
TOWEL GREEN STERILE (TOWEL DISPOSABLE) ×2 IMPLANT
TOWEL GREEN STERILE FF (TOWEL DISPOSABLE) ×2 IMPLANT
TUBE CONNECTING 12X1/4 (SUCTIONS) ×2 IMPLANT
YANKAUER SUCT BULB TIP NO VENT (SUCTIONS) ×2 IMPLANT

## 2017-09-24 NOTE — Interval H&P Note (Signed)
History and Physical Interval Note:  09/24/2017 10:54 AM  Thomas [name]  has presented today for surgery, with the diagnosis of ulcer  The various methods of treatment have been discussed with the patient and family. After consideration of risks, benefits and other options for treatment, the patient has consented to  Procedure(s): DEBRIDEMENT WOUND (N/A) as a surgical intervention .  The patient's history has been reviewed, patient examined, no change in status, stable for surgery.  I have reviewed the patient's chart and labs.  Questions were answered to the patient's satisfaction.    [name]

## 2017-09-24 NOTE — Op Note (Signed)
Patient Name: Thomas [name] DOB: Dec 16, 1966  MRN: [id number]  Date of Service: 09/24/17  Surgeon: Dr. Hardie Pulley, DPM Assistants: None Pre-operative Diagnosis: Ulceration R Heel Post-operative Diagnosis: same Procedures:             1) Debridement Skin Subcutaneous Tissue, Fascia (CPT 91660, 60045 x2)  2) Application skin graft substitute (CPT 15271) Pathology/Specimens:             1) None Anesthesia: Local/MAC Hemostasis: Anatomic Estimated Blood Loss: 5 Materials: None Medications: 1g Vancomycin powder Complications: None  Indications for Procedure:   Procedure in Detail: Patient was identified in pre-operative holding area. Formal consent was signed and the right lower extremity was marked. Patient was brought back to the operating room and placed on the operating room table in the supine position. Anesthesia was induced. The right lower extremity was prepped and draped in the usual sterile fashion. Timeout was taken to confirm patient name, laterality, and procedure prior to incision. Attention was then directed to the R heel where a wound measured 7 cm x 5 cm x 0.1 cm. Sharp excisional debridement was performed to the level of the deep fascia. All non-viable soft tissue was debrided to granular, bleeding wound base. Post-debridement the wound measured 8 cm x 6 cm x 0.1 cm. An Integra Bilayer graft that was soaked in saline with 1g added of vancomycin powder was applied to the wound and sutured with 4-0 chromic suture. The wound was then dressed with 4x4, kerlix fluff, and ACE bandage. Patient tolerated the procedure well without immediate complication.   Disposition: Following a period of post-operative monitoring, patient will be transferred back home. His dressing will remain intact until his post-operative visit this Friday, 09/26/17 @ 10:15

## 2017-09-24 NOTE — Anesthesia Preprocedure Evaluation (Addendum)
Anesthesia Evaluation  Patient identified by MRN, date of birth, ID band Patient awake    Reviewed: Allergy & Precautions, NPO status , Patient's Chart, lab work & pertinent test results  Airway Mallampati: II  TM Distance: >3 FB     Dental   Pulmonary neg pulmonary ROS,    breath sounds clear to auscultation       Cardiovascular hypertension,  Rhythm:Regular Rate:Normal     Neuro/Psych    GI/Hepatic Neg liver ROS, GERD  ,  Endo/Other  diabetes  Renal/GU Renal disease     Musculoskeletal   Abdominal   Peds  Hematology  (+) anemia ,   Anesthesia Other Findings   Reproductive/Obstetrics                             Anesthesia Physical Anesthesia Plan  ASA: III  Anesthesia Plan: MAC   Post-op Pain Management:    Induction: Intravenous  PONV Risk Score and Plan: 2 and Ondansetron, Dexamethasone and Treatment may vary due to age or medical condition  Airway Management Planned: Simple Face Mask  Additional Equipment:   Intra-op Plan:   Post-operative Plan:   Informed Consent: I have reviewed the patients History and Physical, chart, labs and discussed the procedure including the risks, benefits and alternatives for the proposed anesthesia with the patient or authorized representative who has indicated his/her understanding and acceptance.   Dental advisory given  Plan Discussed with: CRNA and Anesthesiologist  Anesthesia Plan Comments:        Anesthesia Quick Evaluation

## 2017-09-24 NOTE — OR Nursing (Signed)
Pt stated Dr. March Rummage told him he is to use his boot that he already has and follow up with him on Friday.

## 2017-09-24 NOTE — Transfer of Care (Signed)
Immediate Anesthesia Transfer of Care Note  Patient: Thomas [name]  Procedure(s) Performed: Procedure(s): DEBRIDEMENT WOUND (N/A)  Patient Location: PACU  Anesthesia Type:MAC  Level of Consciousness: drowsy and patient cooperative  Airway & Oxygen Therapy: Patient Spontanous Breathing and Patient connected to face mask oxygen  Post-op Assessment: Report given to RN and Post -op Vital signs reviewed and stable  Post vital signs: Reviewed and stable  Last Vitals:  Vitals:   09/24/17 0753 09/24/17 1300  BP: 124/78 100/75  Pulse: 96 93  Resp: 18 15  Temp: 36.9 C 36.7 C  SpO2: 96% 95%    Last Pain:  Vitals:   09/24/17 0753  TempSrc: Oral      Patients Stated Pain Goal: 3 (32/76/14 7092)  Complications: No apparent anesthesia complications

## 2017-09-24 NOTE — Progress Notes (Signed)
   Subjective:    Patient ID: Thomas [name], male    DOB: Oct 30, 1966, 51 y.o.   MRN: [id number]  HPI Chief Complaint  Patient presents with  . Routine Post Op    DOS 09/04/17 Wound debridement Right heel; pt stated, "Has no pain    51 y.o. male returns for the above complaint. No pain in R heel. States his wife has been performing WTD dressing changes. Denies N/V/F/Ch. Denies new issues.   Review of Systems    Objective:   Physical Exam Vitals:   09/17/17 1413  BP: 122/77  Pulse: 94  Resp: 16  Temp: 98.2 F (36.8 C)   General AA&O x3. Normal mood and affect.  Vascular Dorsalis pedis and posterior tibial pulses  barely palpable right  Capillary refill normal to all digits. Pedal hair growth absent.  Neurologic Epicritic sensation grossly present. Protective sensation absent  Dermatologic (Wound) Wound Location: R heel Wound Measurement: 7 x 5.5 x 0.3 Wound Base: Mixed Granular/Fibrotic Peri-wound: Macerated Exudate: None: wound tissue dry Wound progress: Decreased since last check.  Orthopedic: L BKA noted      Assessment & Plan:  Ulcer R Heel -Wound with significant fibrotic tissue. Slightly larger in size since last visit. -WTD dressing placed today. -Will schedule patient for debridement of fibrotic tissue with application of skin graft substitute. -Patient no longer wishes for [facility] as his wife states she can perform WTD dressings herself.  F/u after surgery.

## 2017-09-24 NOTE — Anesthesia Postprocedure Evaluation (Signed)
Anesthesia Post Note  Patient: Thomas [name]  Procedure(s) Performed: Procedure(s) (LRB): DEBRIDEMENT WOUND (N/A)     Patient location during evaluation: PACU Anesthesia Type: MAC Level of consciousness: awake Pain management: pain level controlled Vital Signs Assessment: post-procedure vital signs reviewed and stable Respiratory status: spontaneous breathing Cardiovascular status: stable Anesthetic complications: no    Last Vitals:  Vitals:   09/24/17 1335 09/24/17 1345  BP: (!) 143/78   Pulse: 85 82  Resp:  15  Temp:  36.7 C  SpO2: 98% 92%    Last Pain:  Vitals:   09/24/17 1330  TempSrc:   PainSc: 0-No pain                 Jaynee Winters

## 2017-09-24 NOTE — H&P (View-Only) (Signed)
   Subjective:    Patient ID: Thomas [name], male    DOB: 1966/11/21, 51 y.o.   MRN: [id number]  HPI Chief Complaint  Patient presents with  . Routine Post Op    i had surgery on my right foot and i am doing good    51 y.o. male returns for the above complaint. States the wound has been doing better. Has wound VAC on today that has been applied by [facility]. Denies N/V/F/Ch.  Review of Systems    Objective:   Physical Exam There were no vitals filed for this visit. General AA&O x3. Normal mood and affect.  Vascular Dorsalis pedis and posterior tibial pulses  barely palpable right  Capillary refill normal to all digits. Pedal hair growth absent.  Neurologic Epicritic sensation grossly present. Protective sensation absent  Dermatologic (Wound) Wound Location: R heel Wound Measurement: 6 x 5.5 x 0.3 Wound Base: Mixed Granular/Fibrotic Peri-wound: Macerated Exudate: Scant/small amount Serosanguinous exudate Wound progress: Improved since last check.   Orthopedic: MMT 5/5 in dorsiflexion, plantarflexion, inversion, and eversion. Normal lower extremity joint ROM without pain or crepitus.     Assessment & Plan:  R Heel Ulceration -Wound improvement noted. Significant coverage garnered from [facility]. -Wound today with significant fibrotic tissue. -Orders written for [facility]. Will skip VAC application, apply Santyl WTD daily to remove slough. Resume VAC later when wound more granular. -Anticipate only a few more VAC applications.

## 2017-09-24 NOTE — Brief Op Note (Signed)
09/24/2017  1:00 PM  PATIENT:  Thomas [name]  51 y.o. male  PRE-OPERATIVE DIAGNOSIS:  Ulcer R heel  POST-OPERATIVE DIAGNOSIS:  Ulcer R heel  PROCEDURE:  Procedure(s): DEBRIDEMENT WOUND (N/A); application skin graft substitute  SURGEON:  Surgeon(s) and Role:    * [name], DPM - Primary  PHYSICIAN ASSISTANT:   ASSISTANTS: none   ANESTHESIA:   local and MAC  EBL:  No intake/output data recorded.  BLOOD ADMINISTERED:none  DRAINS: none   LOCAL MEDICATIONS USED:  MARCAINE    and Amount: 20 ml  SPECIMEN:  No Specimen  DISPOSITION OF SPECIMEN:  N/A  COUNTS:  YES  TOURNIQUET:  * No tourniquets in log *  DICTATION: .Note written in EPIC  PLAN OF CARE: Discharge to home after PACU  PATIENT DISPOSITION:  PACU - hemodynamically stable.   Delay start of Pharmacological VTE agent (>24hrs) due to surgical blood loss or risk of bleeding: no

## 2017-09-24 NOTE — Discharge Instructions (Signed)
General Anesthesia, Adult, Care After °These instructions provide you with information about caring for yourself after your procedure. Your health care provider may also give you more specific instructions. Your treatment has been planned according to current medical practices, but problems sometimes occur. Call your health care provider if you have any problems or questions after your procedure. °What can I expect after the procedure? °After the procedure, it is common to have: °· Vomiting. °· A sore throat. °· Mental slowness. ° °It is common to feel: °· Nauseous. °· Cold or shivery. °· Sleepy. °· Tired. °· Sore or achy, even in parts of your body where you did not have surgery. ° °Follow these instructions at home: °For at least 24 hours after the procedure: °· Do not: °? Participate in activities where you could fall or become injured. °? Drive. °? Use heavy machinery. °? Drink alcohol. °? Take sleeping pills or medicines that cause drowsiness. °? Make important decisions or sign legal documents. °? Take care of children on your own. °· Rest. °Eating and drinking °· If you vomit, drink water, juice, or soup when you can drink without vomiting. °· Drink enough fluid to keep your urine clear or pale yellow. °· Make sure you have little or no nausea before eating solid foods. °· Follow the diet recommended by your health care provider. °General instructions °· Have a responsible adult stay with you until you are awake and alert. °· Return to your normal activities as told by your health care provider. Ask your health care provider what activities are safe for you. °· Take over-the-counter and prescription medicines only as told by your health care provider. °· If you smoke, do not smoke without supervision. °· Keep all follow-up visits as told by your health care provider. This is important. °Contact a health care provider if: °· You continue to have nausea or vomiting at home, and medicines are not helpful. °· You  cannot drink fluids or start eating again. °· You cannot urinate after 8-12 hours. °· You develop a skin rash. °· You have fever. °· You have increasing redness at the site of your procedure. °Get help right away if: °· You have difficulty breathing. °· You have chest pain. °· You have unexpected bleeding. °· You feel that you are having a life-threatening or urgent problem. °This information is not intended to replace advice given to you by your health care provider. Make sure you discuss any questions you have with your health care provider. °Document Released: 03/24/2001 Document Revised: 05/20/2016 Document Reviewed: 11/30/2015 °Elsevier Interactive Patient Education © 2018 [employer] ° °

## 2017-09-25 ENCOUNTER — Encounter (HOSPITAL_COMMUNITY): Payer: Self-pay | Admitting: Podiatry

## 2017-09-26 ENCOUNTER — Ambulatory Visit (INDEPENDENT_AMBULATORY_CARE_PROVIDER_SITE_OTHER): Payer: Medicare Other | Admitting: Podiatry

## 2017-09-26 ENCOUNTER — Encounter: Payer: Self-pay | Admitting: Podiatry

## 2017-09-26 VITALS — Temp 98.3°F

## 2017-09-26 DIAGNOSIS — L97412 Non-pressure chronic ulcer of right heel and midfoot with fat layer exposed: Secondary | ICD-10-CM | POA: Diagnosis not present

## 2017-09-26 NOTE — Progress Notes (Signed)
   Subjective:    Patient ID: Thomas [name], male    DOB: [birth year], 51 y.o.   MRN: [id number]  HPI Chief Complaint  Patient presents with  . Routine Post Op    POV #1  DOS 09-24-17  Wound debridement right heel with graft     51 y.o. male returns for the above complaint. Denies pain in the right heel. Denies nausea vomiting fever chills. Denies other pedal issues.  Review of Systems     Objective:   Physical Exam Vitals:   09/26/17 1003  Temp: 98.3 F (36.8 C)   General AA&O x3. Normal mood and affect.  Vascular Dorsalis pedis and posterior tibial pulses  present 2+ Right Capillary refill normal to all digits. Pedal hair growth absent .  Neurologic Epicritic sensation grossly present. Protective sensation absent  Dermatologic (Wound) Wound Location: Right heel Wound Measurement: 8 cm x 6 cm x 0.1 cm Wound Base: Unable to evaluate Peri-wound: Normal Exudate: Scant/small amount Serosanguinous exudate Intact Integra graft to the right heel   Orthopedic: Left BKA noted       Assessment & Plan:  Ulceration right heel status post skin graft substitute -Graft intact healing well no evidence of seroma underneath the graft -Bolster dressing applied to the right heel  -Patient advised to leave intact and changed only as needed for strikethrough  Follow-up in 1 week

## 2017-10-03 ENCOUNTER — Ambulatory Visit (INDEPENDENT_AMBULATORY_CARE_PROVIDER_SITE_OTHER): Payer: Medicare Other | Admitting: Podiatry

## 2017-10-03 DIAGNOSIS — E1151 Type 2 diabetes mellitus with diabetic peripheral angiopathy without gangrene: Secondary | ICD-10-CM

## 2017-10-03 DIAGNOSIS — E1142 Type 2 diabetes mellitus with diabetic polyneuropathy: Secondary | ICD-10-CM

## 2017-10-03 DIAGNOSIS — L97412 Non-pressure chronic ulcer of right heel and midfoot with fat layer exposed: Secondary | ICD-10-CM

## 2017-10-03 DIAGNOSIS — B351 Tinea unguium: Secondary | ICD-10-CM | POA: Diagnosis not present

## 2017-10-03 MED ORDER — SALINE 0.9 % SOLN: 0 refills | Status: DC

## 2017-10-03 NOTE — Progress Notes (Signed)
  Subjective:  Patient ID: Thomas [name], male    DOB: 1966/11/06,  MRN: [id number]  Chief Complaint  Patient presents with  . Wound Check    right heel   51 y.o. male returns for the above complaint. Reports some throbbing pain. States he is changing the dressing every day. Also requesting routine foot care today   Objective:   General AA&O x3. Normal mood and affect.  Vascular Dorsalis pedis and posterior tibial pulses barely present right Capillary refill normal to all digits. Pedal hair growth absent.  Neurologic Epicritic sensation grossly present. Protective sensation absent  Dermatologic (Wound) Wound Location: Right heel Wound Measurement: 7 cm x 5 cm x 0.1 cm Wound Base: 80/20 granular/fibrotic Peri-wound: macerated Exudate: Scant/small amount serosanguinous exudate Wound Progress: improved since last visit.  Nails 5 elongated dystrophic brown discoloration   Orthopedic: Left BKA noted     Assessment & Plan:  Patient was evaluated and treated and all questions answered.  Ulceration right heel -Integra wound graft removed. The wound with healthy mostly granular base. Some fibrosis peripherally. -No debridement performed today -Endoform collagen matrix applied to the wound today.  -Will resume saline wet-to-dry dressings daily. Patient's to leave today's dressing intact until Sunday however.  Onychomycosis -Nails debrided as below  Procedure: Nail Debridement Rationale: Patient meets criteria for routine foot care due to Class A findings Type of Debridement: manual, sharp debridement. Instrumentation: Nail nipper, rotary burr. Number of Nails: 5  Return in about 1 week (around 10/10/2017).

## 2017-10-10 ENCOUNTER — Encounter: Payer: Medicare Other | Admitting: Podiatry

## 2017-10-17 ENCOUNTER — Encounter: Payer: Self-pay | Admitting: Podiatry

## 2017-10-17 ENCOUNTER — Ambulatory Visit (INDEPENDENT_AMBULATORY_CARE_PROVIDER_SITE_OTHER): Payer: Medicare Other | Admitting: Podiatry

## 2017-10-17 VITALS — BP 137/66 | HR 103 | Resp 16

## 2017-10-17 DIAGNOSIS — L97411 Non-pressure chronic ulcer of right heel and midfoot limited to breakdown of skin: Secondary | ICD-10-CM

## 2017-10-19 NOTE — Progress Notes (Signed)
  Subjective:  Patient ID: Thomas [name], male    DOB: 01-Jun-1966,  MRN: [id number]  Chief Complaint  Patient presents with  . Foot Ulcer    Follow up ulcer heel right   "Its doing okay"   51 y.o. male returns for the above complaint. Reports some throbbing pain. States he is changing the dressing every day. Also requesting routine foot care today   Objective:   General AA&O x3. Normal mood and affect.  Vascular Dorsalis pedis and posterior tibial pulses barely present right Capillary refill normal to all digits. Pedal hair growth absent.  Neurologic Epicritic sensation grossly present. Protective sensation absent  Dermatologic (Wound) Wound Location: Right heel Wound Measurement Pre-Debridement: 7cm x 6cm x 0.1 cm Wound Measurement Post-Debridement: 7 cm x 7 cm x 0.1 Wound Base: 50/50 granular/fibrotic Peri-wound: macerated Exudate: Scant/small amount serosanguinous exudate No probe to bone. Wound Progress: similar to last check.   Orthopedic: Left BKA noted     Assessment & Plan:  Patient was evaluated and treated and all questions answered.  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 Discussed the importance of putting a pillow underneath his Achilles tendon to completely offload the heel while in bed. -Excisional wound debridement performed as below -Saline wet-to-dry dressing placed. Continue daily dressings as such -Anterior aspect of room while granulating in almost flush to skin. Posterior aspect wound slower to heal. Excisional debridement performed today. We'll consider reapplication of skin graft substitute at next visit.  Procedure: Excisional Debridement of Wound Rationale: Removal of non-viable soft tissue from the wound to promote healing.  Anesthesia: Lidocaine cream Pre-Debridement Wound Measurements: 7 cm x 6 cm x 0.1 cm  Post-Debridement Wound Measurements: 7 cm x 7 cm x 0.1 cm  Type of Debridement: Excisional Tissue  Removed: Non-viable soft tissue Depth of Debridement: subcutaneous tissue Instrumentation: Scissors and pickup, tissue nipper Technique: Sharp excisional debridement to bleeding, viable wound base.  Dressing: Dry, sterile, compression dressing. Disposition: Patient tolerated procedure well. Patient to return in 1 week for follow-up.

## 2017-10-22 ENCOUNTER — Ambulatory Visit (INDEPENDENT_AMBULATORY_CARE_PROVIDER_SITE_OTHER): Payer: Medicare Other | Admitting: Podiatry

## 2017-10-22 DIAGNOSIS — L97412 Non-pressure chronic ulcer of right heel and midfoot with fat layer exposed: Secondary | ICD-10-CM | POA: Diagnosis not present

## 2017-10-22 DIAGNOSIS — L928 Other granulomatous disorders of the skin and subcutaneous tissue: Secondary | ICD-10-CM | POA: Diagnosis not present

## 2017-10-24 NOTE — Progress Notes (Signed)
  Subjective:  Patient ID: Thomas [name], male    DOB: [birth year],  MRN: [id number]  51 y.o. male returns for f/u R heel wound. Denies complaints today. States he has been using pillows to take pressure off of his heel.  Objective:   General AA&O x3. Normal mood and affect.  Vascular Dorsalis pedis and posterior tibial pulses barely present right Capillary refill normal to all digits. Pedal hair growth absent.  Neurologic Epicritic sensation grossly present. Protective sensation absent  Dermatologic (Wound) Wound Location: Right heel Wound Measurement Pre-Debridement: 5.5 cm x 7cm x 0.1 Wound Measurement Post-Debridement: 6.0 cm x 7 cm x 0.1 Wound Base: 50/50 granular/fibrotic Peri-wound: intact Exudate: Scant/small amount serosanguinous exudate No probe to bone. Soft eschar at the posterior half of the wound. Wound Progress: improved since last check  Orthopedic: Left BKA noted     Assessment & Plan:  Patient was evaluated and treated and all questions answered.  Ulceration right heel -Anterior aspect of wound healing well, flush to skin. Silver nitrate applied to promote granulation. -Posterior aspect of wound with fibrotic eschar removed during debridement today. -If wound does not improve at next visit will consider debridement and wound graft application.  Procedure: Excisional Debridement of Wound Rationale: Removal of non-viable soft tissue from the wound to promote healing; excisional debridement of posterior aspect of wound only. Anesthesia: none Pre-Debridement Wound Measurements: 5.5 cm x 7 cm x 0.1 cm  Post-Debridement Wound Measurements: 6.0 cm x 7 cm x 0.1 cm  Type of Debridement: Excisional Tissue Removed: Non-viable soft tissue Depth of Debridement: subcutaneous tisuse Instrumentation: scissors and forcep. Technique: Sharp excisional debridement to bleeding, viable wound base.  Dressing: Dry, sterile, compression dressing. Disposition: Patient tolerated  procedure well. Patient to return in 1 week for follow-up.  Procedure: Chemical Cauterization of Granulation Tissue Rationale: Cauterize granular wound base to promote healing; anterior portion only. Instrumentation: Silver nitrate stick x3 Dressing: Dry, sterile, compression dressing. Disposition: Patient tolerated procedure well. Patient to return in 1 week for follow-up.

## 2017-10-31 ENCOUNTER — Ambulatory Visit (INDEPENDENT_AMBULATORY_CARE_PROVIDER_SITE_OTHER): Payer: Medicare Other | Admitting: Podiatry

## 2017-10-31 ENCOUNTER — Encounter: Payer: Self-pay | Admitting: Podiatry

## 2017-10-31 DIAGNOSIS — L97412 Non-pressure chronic ulcer of right heel and midfoot with fat layer exposed: Secondary | ICD-10-CM | POA: Diagnosis not present

## 2017-10-31 NOTE — H&P (View-Only) (Signed)
  Subjective:  Patient ID: Thomas [name], male    DOB: 20-May-1966,  MRN: [id number]  Chief Complaint  Patient presents with  . Routine Post Op    it was sore and tender and it has been throbbing some too on my right heel area   51 y.o. male returns for the above complaint. Has been performing WTD dressings. No new complaints.  Objective:  There were no vitals filed for this visit. General AA&O x3. Normal mood and affect.  Vascular Dorsalis pedis and posterior tibial pulses barely present right  Neurologic Epicritic sensation grossly present. Protective sensation absent  Dermatologic (Wound) Wound Location: R heel Wound Measurement: 6.0 x 7.0 x 0.1 (depth 0.1 anteriorly, 0.3 posteriorly) Wound Base: Mixed Granular/Fibroti ; posteriro aspect of wound with necrotic slough.  Peri-wound: Macerated Exudate: Scant/small amount Serosanguinous exudate Wound progress: No Change since last check.  Orthopedic: MMT 5/5 in dorsiflexion, plantarflexion, inversion, and eversion. Normal lower extremity joint ROM without pain or crepitus.   Radiographs: Taken and reviewed. No osseous erosions present. No acute fractures or dislocations.   Assessment & Plan:  Patient was evaluated and treated and all questions answered.  R Heel Ulcer -Anterior aspect of wound progressing with healthy granular wound base. Posterior aspect of wound with slough prior to debridement. -No significant improvement made in the past several visits. Will schedule for debridement and wound graft substitute application. Consent reviewed and signed. Date to be determined by surgical scheduler. -Selective debridement as below. -Medihoney applied (1.5 oz tube, product [id number])  Procedure: Selective Debridement of Wound Rationale: Removal of devitalized tissue from the wound to promote healing.  Pre-Debridement Wound Measurements: 6.0 cm x 7.0 cm x 0.1 cm  Post-Debridement Wound Measurements: same as pre-debridement. Type of  Debridement: Selective Tissue Removed: Devitalized soft-tissue Instrumentation: 3-0 mm dermal curette Dressing: Dry, sterile, compression dressing. Disposition: Patient tolerated procedure well. Patient to return in 1 week for follow-up.   Return in about 5 days (around 11/05/2017).

## 2017-10-31 NOTE — Patient Instructions (Signed)
Pre-Operative Instructions  Congratulations, you have decided to take an important step towards improving your quality of life.  You can be assured that the doctors and staff at [facility] will be with you every step of the way.  Here are some important things you should know:  1. Plan to be at the surgery center/hospital at least 1 (one) hour prior to your scheduled time, unless otherwise directed by the surgical center/hospital staff.  You must have a responsible adult accompany you, remain during the surgery and drive you home.  Make sure you have directions to the surgical center/hospital to ensure you arrive on time. 2. If you are having surgery at [facility] or [facility], you will need a copy of your medical history and physical form from your family physician within one month prior to the date of surgery. We will give you a form for your primary physician to complete.  3. We make every effort to accommodate the date you request for surgery.  However, there are times where surgery dates or times have to be moved.  We will contact you as soon as possible if a change in schedule is required.   4. No aspirin/ibuprofen for one week before surgery.  If you are on aspirin, any non-steroidal anti-inflammatory medications (Mobic, Aleve, Ibuprofen) should not be taken seven (7) days prior to your surgery.  You make take Tylenol for pain prior to surgery.  5. Medications - If you are taking daily heart and blood pressure medications, seizure, reflux, allergy, asthma, anxiety, pain or diabetes medications, make sure you notify the surgery center/hospital before the day of surgery so they can tell you which medications you should take or avoid the day of surgery. 6. No food or drink after midnight the night before surgery unless directed otherwise by surgical center/hospital staff. 7. No alcoholic beverages 24-hours prior to surgery.  No smoking 24-hours prior or 24-hours after  surgery. 8. Wear loose pants or shorts. They should be loose enough to fit over bandages, boots, and casts. 9. Don't wear slip-on shoes. Sneakers are preferred. 10. Bring your boot with you to the surgery center/hospital.  Also bring crutches or a walker if your physician has prescribed it for you.  If you do not have this equipment, it will be provided for you after surgery. 11. If you have not been contacted by the surgery center/hospital by the day before your surgery, call to confirm the date and time of your surgery. 12. Leave-time from work may vary depending on the type of surgery you have.  Appropriate arrangements should be made prior to surgery with your employer. 13. Prescriptions will be provided immediately following surgery by your doctor.  Fill these as soon as possible after surgery and take the medication as directed. Pain medications will not be refilled on weekends and must be approved by the doctor. 14. Remove nail polish on the operative foot and avoid getting pedicures prior to surgery. 15. Wash the night before surgery.  The night before surgery wash the foot and leg well with water and the antibacterial soap provided. Be sure to pay special attention to beneath the toenails and in between the toes.  Wash for at least three (3) minutes. Rinse thoroughly with water and dry well with a towel.  Perform this wash unless told not to do so by your physician.  Enclosed: 1 Ice pack (please put in freezer the night before surgery)   1 Hibiclens skin cleaner     Pre-op instructions  If you have any questions regarding the instructions, please do not hesitate to call our office.  [place]: [address], [place], [place] [address] -- [phone]  [place]: [address] , [place] [address] -- [phone]  [place]: [address]  [place], [place] [address] -- [phone]  [place]: [address], [address], [place], [place] [address] -- [phone]  Website:  [email or web address] 

## 2017-10-31 NOTE — Progress Notes (Signed)
  Subjective:  Patient ID: Thomas [name], male    DOB: [birth year],  MRN: [id number]  Chief Complaint  Patient presents with  . Routine Post Op    it was sore and tender and it has been throbbing some too on my right heel area   51 y.o. male returns for the above complaint. Has been performing WTD dressings. No new complaints.  Objective:  There were no vitals filed for this visit. General AA&O x3. Normal mood and affect.  Vascular Dorsalis pedis and posterior tibial pulses barely present right  Neurologic Epicritic sensation grossly present. Protective sensation absent  Dermatologic (Wound) Wound Location: R heel Wound Measurement: 6.0 x 7.0 x 0.1 (depth 0.1 anteriorly, 0.3 posteriorly) Wound Base: Mixed Granular/Fibroti ; posteriro aspect of wound with necrotic slough.  Peri-wound: Macerated Exudate: Scant/small amount Serosanguinous exudate Wound progress: No Change since last check.  Orthopedic: MMT 5/5 in dorsiflexion, plantarflexion, inversion, and eversion. Normal lower extremity joint ROM without pain or crepitus.   Radiographs: Taken and reviewed. No osseous erosions present. No acute fractures or dislocations.   Assessment & Plan:  Patient was evaluated and treated and all questions answered.  R Heel Ulcer -Anterior aspect of wound progressing with healthy granular wound base. Posterior aspect of wound with slough prior to debridement. -No significant improvement made in the past several visits. Will schedule for debridement and wound graft substitute application. Consent reviewed and signed. Date to be determined by surgical scheduler. -Selective debridement as below. -Medihoney applied (1.5 oz tube, product [id number])  Procedure: Selective Debridement of Wound Rationale: Removal of devitalized tissue from the wound to promote healing.  Pre-Debridement Wound Measurements: 6.0 cm x 7.0 cm x 0.1 cm  Post-Debridement Wound Measurements: same as pre-debridement. Type of  Debridement: Selective Tissue Removed: Devitalized soft-tissue Instrumentation: 3-0 mm dermal curette Dressing: Dry, sterile, compression dressing. Disposition: Patient tolerated procedure well. Patient to return in 1 week for follow-up.   Return in about 5 days (around 11/05/2017).

## 2017-11-05 ENCOUNTER — Ambulatory Visit (INDEPENDENT_AMBULATORY_CARE_PROVIDER_SITE_OTHER): Payer: Medicare Other | Admitting: Podiatry

## 2017-11-05 ENCOUNTER — Encounter: Payer: Self-pay | Admitting: Podiatry

## 2017-11-05 DIAGNOSIS — L97412 Non-pressure chronic ulcer of right heel and midfoot with fat layer exposed: Secondary | ICD-10-CM

## 2017-11-07 ENCOUNTER — Telehealth: Payer: Self-pay | Admitting: *Deleted

## 2017-11-07 NOTE — Telephone Encounter (Signed)
I left the patient a message that we were not able to get his surgery scheduled for Wednesday of next week.  No time was available on that date.  I told him I will try and get it scheduled for Friday, November 16.  I will give him a call back on Monday.

## 2017-11-10 NOTE — Telephone Encounter (Signed)
I had told you that we were not able to schedule your surgery for Wednesday at [facility].  I told you I would try for Friday of this week.  I was actually able to schedule it for this Wednesday, November 14, at [place] at the hospital.  "So it's not going to be on Friday?"  No, it will be this Wednesday.  Have you had the history and physical form filled out?  "I took them the papers to fill out."  Have you seen them within the past 30 days?  "Yes, I have.  So what time will I need to be there?"  Someone from the surgical center will call you with the arrival time either today or tomorrow.

## 2017-11-11 ENCOUNTER — Encounter (HOSPITAL_COMMUNITY): Payer: Self-pay | Admitting: *Deleted

## 2017-11-11 ENCOUNTER — Telehealth: Payer: Self-pay | Admitting: *Deleted

## 2017-11-11 NOTE — Telephone Encounter (Signed)
Thomas [name] Short [name] states pt is on coumadin, will there need to be a change in therapy for surgery. Left message with Thomas [name] and Thomas [name] took message for Thomas [name] to call with instructions concerning Coumadin. Thomas [name] stated pt is to remain on Coumadin, no change. I informed Thomas [name] of Dr. Eleanora [name] orders.

## 2017-11-11 NOTE — Progress Notes (Addendum)
Anesthesia Chart Review: SAME DAY WORK-UP.  Patient is a 51 year old male scheduled for I&D right foot ulcer, skin graft application right foot on 11/12/17 at 8:30 AM by Hardie Pulley, DPM. Anesthesia type is posted as MAC.  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 - Hospitalized 05/05/17 - 05/08/17 with worsening SOB, volume overload, hypoxemia that was treated with BiPAP and emergent hemodialysis. Troponin elevated (0.8) and felt probably from demand ischemia from CHF/CKD. Cardiology consulted and did not recommended further in-patient work-up, but could consider out-patient stress test. Follow-up with Dr. [name] recommended. Echo results were not known at that time of cardiology consult note. 05/07/17 echo showed EF 35-40% with inferior akinesis. I don't see any provider comments regarding echo results from that admission.  - Hospitalized 07/25/17 - 07/30/17 for rectal bleeding GI Dr. Herbie Baltimore Buccini consulted. Patient had EGD/colonoscopy and found to have a Mallorie Weiss tear and findings suspicious for ischemic colitis. - Hospitalized 09/03/17 - 09/05/17 for right foot wet gangrene s/p I&D and application of wound VAC 09/04/17. Discharged with a wound VAC and vancomycin at HD. (Eventually had out-patient surgical I&D, skin graft substitute 09/24/17.)  - ID is Dr. Carlyle Basques. - Cardiologist is Dr. Fransico Him. Established care 03/12/17. Six month follow-up planned. Last seen by cardiology 04/2017. He was supposed to schedule follow-up with Dr. [name] following his 04/2017 hospitalization. - Nephrologist is Dr. Jamal Maes.  Meds include amlodipine, Sensipar, Hectorol, ferric gluconate, ferrous sulfate,  Lexiva, Novolog, Lantus, Lopressor, Tab-a-vite, Roxicet, Protonix, raltegravir, ritonavir, Renvela, warfarin. Dr. March Rummage is having patient continue warfarin perioperatively.   EKG 09/03/17: SR with first degree AV block with occasional PVC and PACs.   Echo 05/07/17: Study Conclusions - Left ventricle: The cavity size was moderately dilated. Wall   thickness was increased in a pattern of moderate LVH. Systolic   function was moderately reduced. The estimated ejection fraction   was in the range of 35% to 40%. Akinesis of the inferior   myocardium. Features are consistent with a pseudonormal left   ventricular filling pattern, with concomitant abnormal relaxation   and increased filling pressure (grade 2 diastolic dysfunction). - Aortic valve: There was mild regurgitation. - Mitral valve: There was mild regurgitation. - Left atrium: The atrium was severely dilated. - Right atrium: The atrium was mildly dilated.  He will need updated labs. Discussed above with anesthesiologist Dr. Roberts Gaudy. Patient with abnormal echo in May when admitted for SOB/volume overload. He was to f/u with cardiology which has not happened yet. He does undergo hemodialysis 3X/week and underwent two foot surgeries in 08/2017. If labs acceptable and no acute CV/CHF symptoms then it is anticipated that he can undergo procedure with anticipated MAC anesthesia. Anesthesiologist to evaluate on the day of surgery. (I will sent Dr. [name] a staff message regarding future cardiology follow-up.) Still awaiting H&P (patient to bring in with him by notes).   George Hugh [facility] Short Stay Center/Anesthesiology Phone [phone] 11/11/2017 5:05 PM

## 2017-11-11 NOTE — Progress Notes (Signed)
Wife called and confirmed that patient has H&P form.

## 2017-11-11 NOTE — Telephone Encounter (Signed)
"  I am calling to see if you have the history and physical form for [employer].  He's scheduled for surgery on tomorrow."  I will call the patient and see if he has it.  "Okay, call me back and let me know please."  I called the patient and inquired about his history and physical form.  He said he has it and was going to bring it with him in the morning.  I asked him to fax it to the surgical center at [phone].

## 2017-11-11 NOTE — Progress Notes (Signed)
Patient denies chest pain or shortness of breath. States he has not been told anything about coumadin. Spoke to [place] regarding Coumadin. Dr. March Rummage is aware and ok for patient to stay on Coumadin. Provided the following information:  How do I manage my blood sugar before surgery? . Check your blood sugar at least 4 times a day, starting 2 days before surgery, to make sure that the level is not too high or low. o Check your blood sugar the morning of your surgery when you wake up and every 2 hours until you get to the Short Stay unit. . If your blood sugar is less than 70 mg/dL, you will need to treat for low blood sugar: o Do not take insulin. o Treat a low blood sugar (less than 70 mg/dL) with  cup of clear juice (cranberry or apple), 4 glucose tablets, OR glucose gel. Recheck blood sugar in 15 minutes after treatment (to make sure it is greater than 70 mg/dL). If your blood sugar is not greater than 70 mg/dL on recheck, call [phone] o  for further instructions. . Report your blood sugar to the short stay nurse when you get to Short Stay.  . If you are admitted to the hospital after surgery: o Your blood sugar will be checked by the staff and you will probably be given insulin after surgery (instead of oral diabetes medicines) to make sure you have good blood sugar levels. o The goal for blood sugar control after surgery is 80-180 mg/dL.    WHAT DO I DO ABOUT MY DIABETES MEDICATION?   Marland Kitchen Do not take oral diabetes medicines (pills) the morning of surgery.  . THE NIGHT BEFORE SURGERY, take ____15_______ units of ___Lantus________insulin  . If your CBG is greater than 220 mg/dL, you may take  of your sliding scale (correction) dose of insulin.

## 2017-11-11 NOTE — Progress Notes (Signed)
Left message for Delydia requesting H&P and orders.

## 2017-11-12 ENCOUNTER — Encounter (HOSPITAL_COMMUNITY)
Admission: RE | Disposition: A | Discharge status: Home / Self Care | Payer: Self-pay | Source: Ambulatory Visit | Attending: Podiatry

## 2017-11-12 ENCOUNTER — Ambulatory Visit (HOSPITAL_COMMUNITY): Payer: Medicare Other | Admitting: Vascular Surgery

## 2017-11-12 ENCOUNTER — Encounter (HOSPITAL_COMMUNITY): Payer: Self-pay

## 2017-11-12 ENCOUNTER — Other Ambulatory Visit: Payer: Self-pay

## 2017-11-12 ENCOUNTER — Ambulatory Visit (HOSPITAL_COMMUNITY)
Admission: RE | Admit: 2017-11-12 | Discharge: 2017-11-12 | Disposition: A | Discharge status: Home / Self Care | Payer: Medicare Other | Source: Ambulatory Visit | Attending: Podiatry | Admitting: Podiatry

## 2017-11-12 DIAGNOSIS — Z992 Dependence on renal dialysis: Secondary | ICD-10-CM | POA: Insufficient documentation

## 2017-11-12 DIAGNOSIS — Z794 Long term (current) use of insulin: Secondary | ICD-10-CM | POA: Insufficient documentation

## 2017-11-12 DIAGNOSIS — I080 Rheumatic disorders of both mitral and aortic valves: Secondary | ICD-10-CM | POA: Diagnosis not present

## 2017-11-12 DIAGNOSIS — L97419 Non-pressure chronic ulcer of right heel and midfoot with unspecified severity: Secondary | ICD-10-CM | POA: Diagnosis not present

## 2017-11-12 DIAGNOSIS — N2581 Secondary hyperparathyroidism of renal origin: Secondary | ICD-10-CM | POA: Insufficient documentation

## 2017-11-12 DIAGNOSIS — K219 Gastro-esophageal reflux disease without esophagitis: Secondary | ICD-10-CM | POA: Insufficient documentation

## 2017-11-12 DIAGNOSIS — E1122 Type 2 diabetes mellitus with diabetic chronic kidney disease: Secondary | ICD-10-CM | POA: Diagnosis not present

## 2017-11-12 DIAGNOSIS — Z7901 Long term (current) use of anticoagulants: Secondary | ICD-10-CM | POA: Diagnosis not present

## 2017-11-12 DIAGNOSIS — Z21 Asymptomatic human immunodeficiency virus [HIV] infection status: Secondary | ICD-10-CM | POA: Diagnosis not present

## 2017-11-12 DIAGNOSIS — I491 Atrial premature depolarization: Secondary | ICD-10-CM | POA: Diagnosis not present

## 2017-11-12 DIAGNOSIS — Z79899 Other long term (current) drug therapy: Secondary | ICD-10-CM | POA: Insufficient documentation

## 2017-11-12 DIAGNOSIS — E11621 Type 2 diabetes mellitus with foot ulcer: Secondary | ICD-10-CM | POA: Insufficient documentation

## 2017-11-12 DIAGNOSIS — I120 Hypertensive chronic kidney disease with stage 5 chronic kidney disease or end stage renal disease: Secondary | ICD-10-CM | POA: Insufficient documentation

## 2017-11-12 DIAGNOSIS — Z89512 Acquired absence of left leg below knee: Secondary | ICD-10-CM | POA: Diagnosis not present

## 2017-11-12 DIAGNOSIS — I493 Ventricular premature depolarization: Secondary | ICD-10-CM | POA: Diagnosis not present

## 2017-11-12 DIAGNOSIS — N186 End stage renal disease: Secondary | ICD-10-CM | POA: Diagnosis not present

## 2017-11-12 DIAGNOSIS — I440 Atrioventricular block, first degree: Secondary | ICD-10-CM | POA: Diagnosis not present

## 2017-11-12 HISTORY — PX: I & D EXTREMITY: SHX5045

## 2017-11-12 HISTORY — DX: Type 2 diabetes mellitus without complications: E11.9

## 2017-11-12 HISTORY — PX: GRAFT APPLICATION: SHX6696

## 2017-11-12 LAB — BASIC METABOLIC PANEL
Anion gap: 14 (ref 5–15)
BUN: 33 mg/dL — ABNORMAL HIGH (ref 6–20)
CO2: 26 mmol/L (ref 22–32)
Calcium: 9.3 mg/dL (ref 8.9–10.3)
Chloride: 95 mmol/L — ABNORMAL LOW (ref 101–111)
Creatinine, Ser: 8.09 mg/dL — ABNORMAL HIGH (ref 0.61–1.24)
GFR calc Af Amer: 8 mL/min — ABNORMAL LOW (ref >60)
GFR calc non Af Amer: 7 mL/min — ABNORMAL LOW (ref >60)
Glucose, Bld: 350 mg/dL — ABNORMAL HIGH (ref 65–99)
Potassium: 4.2 mmol/L (ref 3.5–5.1)
Sodium: 135 mmol/L (ref 135–145)

## 2017-11-12 LAB — CBC
HEMATOCRIT: 29.8 % — AB (ref 39.0–52.0)
HEMOGLOBIN: 9.5 g/dL — AB (ref 13.0–17.0)
MCH: 30.4 pg (ref 26.0–34.0)
MCHC: 31.9 g/dL (ref 30.0–36.0)
MCV: 95.2 fL (ref 78.0–100.0)
Platelets: 308 10*3/uL (ref 150–400)
RBC: 3.13 MIL/uL — ABNORMAL LOW (ref 4.22–5.81)
RDW: 17.2 % — AB (ref 11.5–15.5)
WBC: 12.8 10*3/uL — ABNORMAL HIGH (ref 4.0–10.5)

## 2017-11-12 LAB — GLUCOSE, CAPILLARY
Glucose-Capillary: 316 mg/dL — ABNORMAL HIGH (ref 65–99)
Glucose-Capillary: 329 mg/dL — ABNORMAL HIGH (ref 65–99)

## 2017-11-12 LAB — PROTIME-INR
INR: 2.57
Prothrombin Time: 27.4 seconds — ABNORMAL HIGH (ref 11.4–15.2)

## 2017-11-12 SURGERY — IRRIGATION AND DEBRIDEMENT EXTREMITY
Anesthesia: Monitor Anesthesia Care | Site: Foot | Laterality: Right

## 2017-11-12 MED ORDER — CEFAZOLIN SODIUM-DEXTROSE 2-4 GM/100ML-% IV SOLN
INTRAVENOUS | Status: AC
  Filled 2017-11-12: qty 100

## 2017-11-12 MED ORDER — LIDOCAINE 2% (20 MG/ML) 5 ML SYRINGE
INTRAMUSCULAR | Status: AC
  Filled 2017-11-12: qty 5

## 2017-11-12 MED ORDER — MIDAZOLAM HCL 5 MG/5ML IJ SOLN
INTRAMUSCULAR | Status: DC | PRN
  Administered 2017-11-12: 2 mg via INTRAVENOUS

## 2017-11-12 MED ORDER — EPHEDRINE 5 MG/ML INJ
INTRAVENOUS | Status: AC
  Filled 2017-11-12: qty 10

## 2017-11-12 MED ORDER — CEFAZOLIN SODIUM-DEXTROSE 2-4 GM/100ML-% IV SOLN
2.0000 g | INTRAVENOUS | Status: AC
  Administered 2017-11-12: 2 g via INTRAVENOUS

## 2017-11-12 MED ORDER — SUCCINYLCHOLINE CHLORIDE 200 MG/10ML IV SOSY
PREFILLED_SYRINGE | INTRAVENOUS | Status: AC
  Filled 2017-11-12: qty 10

## 2017-11-12 MED ORDER — SODIUM CHLORIDE 0.9 % IV SOLN
INTRAVENOUS | Status: DC | PRN
  Administered 2017-11-12: 08:00:00 via INTRAVENOUS

## 2017-11-12 MED ORDER — FENTANYL CITRATE (PF) 250 MCG/5ML IJ SOLN
INTRAMUSCULAR | Status: AC
  Filled 2017-11-12: qty 5

## 2017-11-12 MED ORDER — OXYCODONE HCL 5 MG PO TABS: 5.0000 mg | ORAL_TABLET | Freq: Once | ORAL | Status: DC | PRN

## 2017-11-12 MED ORDER — VANCOMYCIN HCL 1000 MG IV SOLR
INTRAVENOUS | Status: DC | PRN
  Administered 2017-11-12: 1000 mg via TOPICAL

## 2017-11-12 MED ORDER — ONDANSETRON HCL 4 MG/2ML IJ SOLN
INTRAMUSCULAR | Status: AC
  Filled 2017-11-12: qty 2

## 2017-11-12 MED ORDER — FENTANYL CITRATE (PF) 100 MCG/2ML IJ SOLN
INTRAMUSCULAR | Status: DC | PRN
  Administered 2017-11-12: 100 ug via INTRAVENOUS
  Administered 2017-11-12: 50 ug via INTRAVENOUS

## 2017-11-12 MED ORDER — MIDAZOLAM HCL 2 MG/2ML IJ SOLN
INTRAMUSCULAR | Status: AC
  Filled 2017-11-12: qty 2

## 2017-11-12 MED ORDER — DEXAMETHASONE SODIUM PHOSPHATE 10 MG/ML IJ SOLN
INTRAMUSCULAR | Status: AC
  Filled 2017-11-12: qty 1

## 2017-11-12 MED ORDER — PROMETHAZINE HCL 25 MG/ML IJ SOLN: 6.2500 mg | INTRAMUSCULAR | Status: DC | PRN

## 2017-11-12 MED ORDER — SUGAMMADEX SODIUM 200 MG/2ML IV SOLN
INTRAVENOUS | Status: AC
  Filled 2017-11-12: qty 2

## 2017-11-12 MED ORDER — BUPIVACAINE HCL (PF) 0.5 % IJ SOLN
INTRAMUSCULAR | Status: DC | PRN
  Administered 2017-11-12: 20 mL

## 2017-11-12 MED ORDER — PROPOFOL 10 MG/ML IV BOLUS
INTRAVENOUS | Status: AC
  Filled 2017-11-12: qty 20

## 2017-11-12 MED ORDER — ROCURONIUM BROMIDE 10 MG/ML (PF) SYRINGE
PREFILLED_SYRINGE | INTRAVENOUS | Status: AC
Start: 2017-11-12 — End: 2017-11-12
  Filled 2017-11-12: qty 5

## 2017-11-12 MED ORDER — CEPHALEXIN 500 MG PO CAPS: 500.0000 mg | ORAL_CAPSULE | Freq: Two times a day (BID) | ORAL | 0 refills | Status: DC

## 2017-11-12 MED ORDER — HYDROMORPHONE HCL 1 MG/ML IJ SOLN: 0.2500 mg | INTRAMUSCULAR | Status: DC | PRN

## 2017-11-12 MED ORDER — OXYCODONE-ACETAMINOPHEN 5-325 MG PO TABS
1.0000 | ORAL_TABLET | ORAL | 0 refills | Status: DC | PRN
Start: 2017-11-12 — End: 2017-12-05

## 2017-11-12 MED ORDER — PROPOFOL 500 MG/50ML IV EMUL
INTRAVENOUS | Status: DC | PRN
  Administered 2017-11-12: 50 ug/kg/min via INTRAVENOUS

## 2017-11-12 MED ORDER — PHENYLEPHRINE 40 MCG/ML (10ML) SYRINGE FOR IV PUSH (FOR BLOOD PRESSURE SUPPORT)
PREFILLED_SYRINGE | INTRAVENOUS | Status: AC
  Filled 2017-11-12: qty 10

## 2017-11-12 MED ORDER — VANCOMYCIN HCL 1000 MG IV SOLR
INTRAVENOUS | Status: AC
  Filled 2017-11-12: qty 1000

## 2017-11-12 MED ORDER — OXYCODONE HCL 5 MG/5ML PO SOLN: 5.0000 mg | Freq: Once | ORAL | Status: DC | PRN

## 2017-11-12 MED ORDER — PROPOFOL 10 MG/ML IV BOLUS
INTRAVENOUS | Status: DC | PRN
  Administered 2017-11-12: 30 mg via INTRAVENOUS

## 2017-11-12 MED ORDER — BUPIVACAINE HCL (PF) 0.5 % IJ SOLN
INTRAMUSCULAR | Status: AC
  Filled 2017-11-12: qty 30

## 2017-11-12 SURGICAL SUPPLY — 37 items
BANDAGE ACE 4X5 VEL STRL LF (GAUZE/BANDAGES/DRESSINGS) ×4 IMPLANT
BNDG ESMARK 4X9 LF (GAUZE/BANDAGES/DRESSINGS) IMPLANT
BNDG GAUZE ELAST 4 BULKY (GAUZE/BANDAGES/DRESSINGS) ×4 IMPLANT
CHLORAPREP W/TINT 26ML (MISCELLANEOUS) ×4 IMPLANT
COVER SURGICAL LIGHT HANDLE (MISCELLANEOUS) ×4 IMPLANT
CUFF TOURNIQUET SINGLE 18IN (TOURNIQUET CUFF) IMPLANT
CUFF TOURNIQUET SINGLE 34IN LL (TOURNIQUET CUFF) IMPLANT
DRAPE U-SHAPE 47X51 STRL (DRAPES) ×4 IMPLANT
ELECT CAUTERY BLADE 6.4 (BLADE) ×4 IMPLANT
ELECT REM PT RETURN 9FT ADLT (ELECTROSURGICAL) ×4
ELECTRODE REM PT RTRN 9FT ADLT (ELECTROSURGICAL) ×2 IMPLANT
GAUZE SPONGE 4X4 12PLY STRL (GAUZE/BANDAGES/DRESSINGS) IMPLANT
GAUZE SPONGE 4X4 12PLY STRL LF (GAUZE/BANDAGES/DRESSINGS) ×4 IMPLANT
GLOVE BIO SURGEON STRL SZ7.5 (GLOVE) ×4 IMPLANT
GLOVE BIOGEL PI IND STRL 8 (GLOVE) ×2 IMPLANT
GLOVE BIOGEL PI INDICATOR 8 (GLOVE) ×2
GOWN STRL REUS W/ TWL LRG LVL3 (GOWN DISPOSABLE) ×2 IMPLANT
GOWN STRL REUS W/ TWL XL LVL3 (GOWN DISPOSABLE) ×2 IMPLANT
GOWN STRL REUS W/TWL LRG LVL3 (GOWN DISPOSABLE) ×2
GOWN STRL REUS W/TWL XL LVL3 (GOWN DISPOSABLE) ×2
KIT BASIN OR (CUSTOM PROCEDURE TRAY) ×4 IMPLANT
KIT ROOM TURNOVER OR (KITS) ×4 IMPLANT
MANIFOLD NEPTUNE II (INSTRUMENTS) ×4 IMPLANT
MATRIX TISSUE MESHED 4X5 (Tissue) ×4 IMPLANT
NEEDLE HYPO 25GX1X1/2 BEV (NEEDLE) IMPLANT
NS IRRIG 1000ML POUR BTL (IV SOLUTION) ×4 IMPLANT
PACK ORTHO EXTREMITY (CUSTOM PROCEDURE TRAY) ×4 IMPLANT
PAD ARMBOARD 7.5X6 YLW CONV (MISCELLANEOUS) ×8 IMPLANT
SCRUB BETADINE 4OZ XXX (MISCELLANEOUS) ×4 IMPLANT
SET CYSTO W/LG BORE CLAMP LF (SET/KITS/TRAYS/PACK) ×4 IMPLANT
SOL PREP POV-IOD 4OZ 10% (MISCELLANEOUS) ×4 IMPLANT
SYR CONTROL 10ML LL (SYRINGE) IMPLANT
TOWEL GREEN STERILE (TOWEL DISPOSABLE) ×4 IMPLANT
TOWEL GREEN STERILE FF (TOWEL DISPOSABLE) ×4 IMPLANT
TUBE CONNECTING 12'X1/4 (SUCTIONS) ×1
TUBE CONNECTING 12X1/4 (SUCTIONS) ×3 IMPLANT
YANKAUER SUCT BULB TIP NO VENT (SUCTIONS) ×4 IMPLANT

## 2017-11-12 NOTE — Discharge Instructions (Signed)
Keep dressing intact until follow-up. Take pain medication as needed. Take antibiotic as directed.

## 2017-11-12 NOTE — Brief Op Note (Signed)
11/12/2017  9:16 AM  PATIENT:  Thomas [name]  51 y.o. male  PRE-OPERATIVE DIAGNOSIS:  DIABETIC ULCER  POST-OPERATIVE DIAGNOSIS:  DIABETIC ULCER  PROCEDURE:  Procedure(s): IRRIGATION AND DEBRIDEMENT ULCER RIGHT FOOT (Right) SKIN GRAFT APPLICATION RIGHT FOOT (Right)  SURGEON:  Surgeon(s) and Role:    * [name], DPM - Primary  PHYSICIAN ASSISTANT:   ASSISTANTS: none   ANESTHESIA:   IV sedation  EBL:  Minimal   BLOOD ADMINISTERED:none  DRAINS: none   LOCAL MEDICATIONS USED:  MARCAINE    and Amount: 20 ml  SPECIMEN:  No Specimen  DISPOSITION OF SPECIMEN:  N/A  COUNTS:  YES  TOURNIQUET:  * No tourniquets in log *  DICTATION: .Note written in EPIC  PLAN OF CARE: Discharge to home after PACU  PATIENT DISPOSITION:  PACU - hemodynamically stable.   Delay start of Pharmacological VTE agent (>24hrs) due to surgical blood loss or risk of bleeding: not applicable

## 2017-11-12 NOTE — Progress Notes (Signed)
Dr. Sabra Heck made aware of pt's CBG 329. No new orders received.

## 2017-11-12 NOTE — Interval H&P Note (Signed)
History and Physical Interval Note:  11/12/2017 8:17 AM  Thomas [name]  has presented today for surgery, with the diagnosis of DIABETIC ULCER  The various methods of treatment have been discussed with the patient and family. After consideration of risks, benefits and other options for treatment, the patient has consented to  Procedure(s): IRRIGATION AND DEBRIDEMENT ULCER RIGHT FOOT (Right) SKIN GRAFT APPLICATION RIGHT FOOT (Right) as a surgical intervention .  The patient's history has been reviewed, patient examined, no change in status, stable for surgery.  I have reviewed the patient's chart and labs.  Questions were answered to the patient's satisfaction.     [name]

## 2017-11-12 NOTE — Progress Notes (Signed)
Anesthesiologist Dr. Sabra Heck made aware of pt's CBG of 316.

## 2017-11-12 NOTE — Transfer of Care (Signed)
Immediate Anesthesia Transfer of Care Note  Patient: Esgar Barnick  Procedure(s) Performed: IRRIGATION AND DEBRIDEMENT ULCER RIGHT FOOT (Right Foot) SKIN GRAFT APPLICATION RIGHT FOOT (Right )  Patient Location: PACU  Anesthesia Type:MAC  Level of Consciousness: awake, alert , oriented and patient cooperative  Airway & Oxygen Therapy: Patient Spontanous Breathing and Patient connected to face mask oxygen  Post-op Assessment: Report given to RN and Post -op Vital signs reviewed and stable  Post vital signs: Reviewed and stable  Last Vitals:  Vitals:   11/12/17 0653  BP: (!) 107/59  Pulse: 97  Resp: 18  Temp: 37.1 C  SpO2: 95%    Last Pain:  Vitals:   11/12/17 0653  TempSrc: Oral  PainSc: 6       Patients Stated Pain Goal: 3 (62/69/48 5462)  Complications: No apparent anesthesia complications

## 2017-11-12 NOTE — Op Note (Addendum)
Patient Name: Thomas [name] DOB: December 13, 1966  MRN: [id number]  Date of Service: 11/12/17 Surgeon: Dr. Hardie Pulley, DPM Assistants: None Pre-operative Diagnosis: R heel ulcer Post-operative Diagnosis: same Procedures:             1) Debridement and irrigation R heel wound - 8 cm x 6 cm  2) Application skin graft substitute. Pathology/Specimens:             1) None Anesthesia: IV sedation; 20 cc's 0.5% Marcaine plain local block. Hemostasis: Anatomic Estimated Blood Loss: minimal Materials: None Medications: 1g Vancomycin powder. Complications: None  Indications for Procedure:   Procedure in Detail: Patient was identified in pre-operative holding area. Formal consent was signed and the right lower extremity was marked. Patient was brought back to the operating room and placed on the operating room table in the supine position. Anesthesia was induced. The right lower extremity was prepped and draped in the usual sterile fashion. Timeout was taken to confirm patient name, laterality, and procedure prior to incision. Attention was then directed to the R heel where the ulcer was measured to be 8 cm x 6 cm x 0.7 cm.  The wound was sharply and excisionally debrided of all nonviable tissue with both 15 blade and rongeur.  The posterior aspect of the wound was deeper than the anterior aspect but the bone was covered. After ensuring that the wound base was viable and healthy, Integra bilayer graft that was soaked in Vancomycin powder and saline was applied over the wound bed and adhered with staples. The foot was then dressed with 4x4, bolster kerlix, and ACE bandage. Patient tolerated the procedure well.  Disposition: Following a period of post-operative monitoring, patient will be transferred back home.

## 2017-11-12 NOTE — Anesthesia Postprocedure Evaluation (Signed)
Anesthesia Post Note  Patient: Maxwell Lemen  Procedure(s) Performed: IRRIGATION AND DEBRIDEMENT ULCER RIGHT FOOT (Right Foot) SKIN GRAFT APPLICATION RIGHT FOOT (Right )     Patient location during evaluation: PACU Anesthesia Type: MAC Level of consciousness: awake and alert Pain management: pain level controlled Vital Signs Assessment: post-procedure vital signs reviewed and stable Respiratory status: spontaneous breathing, nonlabored ventilation and respiratory function stable Cardiovascular status: stable and blood pressure returned to baseline Postop Assessment: no apparent nausea or vomiting Anesthetic complications: no    Last Vitals:  Vitals:   11/12/17 1002 11/12/17 1008  BP: 105/73 106/70  Pulse: 84 81  Resp: 12 12  Temp:  37 C  SpO2: 98% 94%    Last Pain:  Vitals:   11/12/17 0917  TempSrc:   PainSc: 0-No pain                 Lynda Rainwater

## 2017-11-12 NOTE — Anesthesia Preprocedure Evaluation (Signed)
Anesthesia Evaluation  Patient identified by MRN, date of birth, ID band Patient awake    Reviewed: Allergy & Precautions, NPO status , Patient's Chart, lab work & pertinent test results, reviewed documented beta blocker date and time   Airway Mallampati: II  TM Distance: >3 FB     Dental   Pulmonary neg pulmonary ROS,    breath sounds clear to auscultation       Cardiovascular hypertension, Pt. on medications and Pt. on home beta blockers  Rhythm:Regular Rate:Normal     Neuro/Psych    GI/Hepatic Neg liver ROS, GERD  ,  Endo/Other  diabetes, Type 2, Insulin Dependent  Renal/GU Dialysis and ESRFRenal disease     Musculoskeletal   Abdominal   Peds  Hematology  (+) anemia , HIV,   Anesthesia Other Findings   Reproductive/Obstetrics                             Anesthesia Physical  Anesthesia Plan  ASA: IV  Anesthesia Plan: MAC   Post-op Pain Management:    Induction: Intravenous  PONV Risk Score and Plan: 1 and Ondansetron and Treatment may vary due to age or medical condition  Airway Management Planned: Simple Face Mask  Additional Equipment:   Intra-op Plan:   Post-operative Plan:   Informed Consent: I have reviewed the patients History and Physical, chart, labs and discussed the procedure including the risks, benefits and alternatives for the proposed anesthesia with the patient or authorized representative who has indicated his/her understanding and acceptance.   Dental advisory given  Plan Discussed with: CRNA and Anesthesiologist  Anesthesia Plan Comments:         Anesthesia Quick Evaluation

## 2017-11-13 ENCOUNTER — Encounter (HOSPITAL_COMMUNITY): Payer: Self-pay | Admitting: Podiatry

## 2017-11-14 ENCOUNTER — Encounter: Payer: Self-pay | Admitting: Podiatry

## 2017-11-14 ENCOUNTER — Ambulatory Visit (INDEPENDENT_AMBULATORY_CARE_PROVIDER_SITE_OTHER): Payer: Medicare Other | Admitting: Podiatry

## 2017-11-14 VITALS — BP 109/52 | HR 86 | Temp 98.5°F | Resp 16

## 2017-11-14 DIAGNOSIS — L97412 Non-pressure chronic ulcer of right heel and midfoot with fat layer exposed: Secondary | ICD-10-CM | POA: Diagnosis not present

## 2017-11-18 ENCOUNTER — Telehealth: Payer: Self-pay

## 2017-11-18 NOTE — Telephone Encounter (Signed)
Spoke with patient who stated he moved from [place], [place] where he was a patient at [facility] (Medical Records [id number]).  Patient scheduled to see Melina Copa, PA on 12/05/17. Will send to medical records to request records. Patient in agreement with plan and thanked me for the call.

## 2017-11-18 NOTE — Telephone Encounter (Signed)
-----   Message from Thomas Parma, RN sent at 11/12/2017 11:20 AM EST ----- Regarding: FW: May echo   ----- Message ----- From: Sueanne Margarita, MD Sent: 11/12/2017   8:51 AM To: Jacinta Shoe, PA-C, Thomas Parma, RN Subject: RE: May echo                                   Please work patient in with extender and get old records from [facility] ----- Message ----- From: Jacinta Shoe, PA-C Sent: 11/11/2017   5:09 PM To: Sueanne Margarita, MD Subject: May echo                                       Dr. [name], I am a PA with anesthesia at [facility]. In review of Kyvon Hu' chart for upcoming I&D of right foot ulcer (11/12/17 with anticipated MAC anesthesia), I noted an abnormal echo done 04/2017 during admission for worsening SOB and volume overload requiring emergent hemodialysis. He was seen by Delos Haring and Dr. Debara Pickett who recommended out-patient follow-up with you which has not happened yet. (Echo results were not available when they saw patient.)  Echo results showed: - Left ventricle: The cavity size was moderately dilated. Wall   thickness was increased in a pattern of moderate LVH. Systolic   function was moderately reduced. The estimated ejection fraction   was in the range of 35% to 40%. Akinesis of the inferior   myocardium. Features are consistent with a pseudonormal left   ventricular filling pattern, with concomitant abnormal relaxation   and increased filling pressure (grade 2 diastolic dysfunction). - Aortic valve: There was mild regurgitation. - Mitral valve: There was mild regurgitation. - Left atrium: The atrium was severely dilated. - Right atrium: The atrium was mildly dilated.  I was forwarding the echo report to you for follow-up purposes. Hopefully your office is able to get him in to see you in the near future.   George Hugh [facility] Short Stay Center/Anesthesiology Phone [phone] 11/11/2017 5:14 PM

## 2017-11-18 NOTE — Telephone Encounter (Signed)
Thomas [name],  Spoke with patient made him aware I needed to to get a release signed so I can get his records from [facility] he told me to mail him a release he was not coming in office to sign one. I will also call [facility] and see if they will accept letterhead so we can get records before his appointment.

## 2017-11-19 ENCOUNTER — Encounter: Payer: Medicare Other | Admitting: Podiatry

## 2017-11-19 ENCOUNTER — Ambulatory Visit (INDEPENDENT_AMBULATORY_CARE_PROVIDER_SITE_OTHER): Payer: Medicare Other | Admitting: Podiatry

## 2017-11-19 ENCOUNTER — Encounter: Payer: Self-pay | Admitting: Podiatry

## 2017-11-19 VITALS — Temp 98.4°F

## 2017-11-19 DIAGNOSIS — L97412 Non-pressure chronic ulcer of right heel and midfoot with fat layer exposed: Secondary | ICD-10-CM

## 2017-11-19 NOTE — Progress Notes (Signed)
  Subjective:  Patient ID: Thomas [name], male    DOB: 08-15-1966,  MRN: [id number]  Chief Complaint  Patient presents with  . Wound Check    wound care Rt heel   51 y.o. male returns for the above complaint.  Has not been changing the dressing due to the graft.  Denies any issues  Objective:   Vitals:   11/19/17 1016  Temp: 98.4 F (36.9 C)   General AA&O x3. Normal mood and affect.  Vascular Dorsalis pedis and posterior tibial pulses absent right  Neurologic Epicritic sensation grossly present. Protective sensation absent  Dermatologic (Wound) Wound Location: R heel Wound Measurement: 6 x 6.5 x 0.1 Wound Base: Granular/Healthy Peri-wound: Macerated Exudate: Scant/small amount Serous exudate Wound progress: Improved since last check.  Orthopedic: MMT 5/5 in dorsiflexion, plantarflexion, inversion, and eversion. Normal lower extremity joint ROM without pain or crepitus.   Radiographs: Taken and reviewed. No osseous erosions present. No acute fractures or dislocations.  Assessment & Plan:  Patient was evaluated and treated and all questions answered.  Ulcer right heel -Integra graft removed today.  Wound improving, anterior aspects of wound appears to be healing, posterior aspect of the wound still with some depth and fibrotic nature however the bone is protected. -Medihoney applied today -Continue wet-to-dry dressing changes  Return in about 1 week (around 11/26/2017).

## 2017-11-28 ENCOUNTER — Ambulatory Visit (INDEPENDENT_AMBULATORY_CARE_PROVIDER_SITE_OTHER): Payer: Medicare Other | Admitting: Podiatry

## 2017-11-28 ENCOUNTER — Ambulatory Visit (INDEPENDENT_AMBULATORY_CARE_PROVIDER_SITE_OTHER): Payer: Medicare Other

## 2017-11-28 DIAGNOSIS — L97511 Non-pressure chronic ulcer of other part of right foot limited to breakdown of skin: Secondary | ICD-10-CM

## 2017-11-28 DIAGNOSIS — M869 Osteomyelitis, unspecified: Secondary | ICD-10-CM | POA: Diagnosis not present

## 2017-11-28 NOTE — Progress Notes (Signed)
  Subjective:  Patient ID: Thomas [name], male    DOB: [birth year],  MRN: [id number]   51 y.o. male returns for follow-up right heel wound.  Warts new area of skin breakdown to the lateral ankle.  Thinks his dressing was little too tight.  Denies nausea vomiting fever chills.   Objective:  There were no vitals filed for this visit. General AA&O x3. Normal mood and affect.  Vascular Foot warm to touch/  Neurologic Epicritic sensation grossly present. Protective sensation absent  Dermatologic (Wound) Wound Location: R heel Wound Measurement: 5 cm x 7 cm x 0.3 cm with deep probing but no palpable bone.  New area of skin breakdown of the lateral ankle with central pore with expressible hematoma formation Wound Base: Granular/Healthy Peri-wound: Macerated, Calloused Exudate: Scant/small amount Serosanguinous exudate Wound progress: Decreased since last check.   Orthopedic: MMT 5/5 in dorsiflexion, plantarflexion, inversion, and eversion. Normal lower extremity joint ROM without pain or crepitus.   Radiographs: Taken and reviewed. Slightly the appearance of the plantar calcaneus concerning for possible erosion.   Assessment & Plan:  Patient was evaluated and treated and all questions answered.  Ulcer Right heel -X-rays reviewed as above. Slightly increased haziness of the plantar cortex of the calcaneus concerning for possible bone infection -New area of skin breakdown possibly due to pressure.  Hematoma evacuated.  Area of wound communicates to the plantar wound. Wound packed with half-inch Xeroform packing.  His wife to remove packing tomorrow.  Continue wet to dry dressings at this point. Will have patient see Dr. Jacqualyn Posey Monday to see if wound needs to be packed again -Debridement performed as below.  Procedure: Selective Debridement of Wound Rationale: Removal of devitalized tissue from the wound to promote healing.  Pre-Debridement Wound Measurements: 6.5 cm x 7 cm x 0.3  cm Post-Debridement Wound Measurements: same as pre-debridement. Type of Debridement: Selective Tissue Removed: Devitalized soft-tissue Instrumentation: tissue nipper. Dressing: Dry, sterile, compression dressing. Disposition: Patient tolerated procedure well. Patient to return in 1 week for follow-up.   15 minutes of face to face time were spent with the patient. >50% of this was spent on counseling and coordination of care. Specifically discussed with patient the treatment plan for the wound including the concern for possible bone infection.   Return in about 1 week (around 12/05/2017).

## 2017-12-01 ENCOUNTER — Encounter: Payer: Self-pay | Admitting: Podiatry

## 2017-12-01 ENCOUNTER — Ambulatory Visit (INDEPENDENT_AMBULATORY_CARE_PROVIDER_SITE_OTHER): Payer: Medicare Other | Admitting: Podiatry

## 2017-12-01 VITALS — BP 100/72 | HR 92 | Temp 98.8°F | Resp 16

## 2017-12-01 DIAGNOSIS — L97511 Non-pressure chronic ulcer of other part of right foot limited to breakdown of skin: Secondary | ICD-10-CM | POA: Diagnosis not present

## 2017-12-03 ENCOUNTER — Ambulatory Visit (INDEPENDENT_AMBULATORY_CARE_PROVIDER_SITE_OTHER): Payer: Medicare Other | Admitting: Podiatry

## 2017-12-03 ENCOUNTER — Ambulatory Visit (INDEPENDENT_AMBULATORY_CARE_PROVIDER_SITE_OTHER): Payer: Medicare Other

## 2017-12-03 VITALS — Temp 99.0°F

## 2017-12-03 DIAGNOSIS — L97412 Non-pressure chronic ulcer of right heel and midfoot with fat layer exposed: Secondary | ICD-10-CM

## 2017-12-03 MED ORDER — CLINDAMYCIN HCL 150 MG PO CAPS: 150.0000 mg | ORAL_CAPSULE | Freq: Three times a day (TID) | ORAL | 0 refills | Status: DC

## 2017-12-04 ENCOUNTER — Encounter: Payer: Self-pay | Admitting: Physician Assistant

## 2017-12-04 NOTE — Progress Notes (Signed)
Cardiology Office Note    Date:  12/05/2017  ID:  Thomas [name], DOB [birth year], MRN [id number] PCP:  Patient, No Pcp Per  Cardiologist:  Dr. [name]   Chief Complaint: f/u CHF  History of Present Illness:  Thomas [name] is a 51 y.o. male with history of HIV, DM, ESRD on HD, HTN, questionable prior atrial fib or flutter or SVT, L BKA, secondary hyperparathyroidism, anemia, chronic combined CHF who presents for f/u.  He established care with Dr. [name] 02/2017. Prior cardiac care was in [place]. He had some type of SVT and had what sounds like from his description as afib or flutter and had a TEE/DCCV and about 2 weeks later had reoccurrence of the arrhythmia and had an ablation. He had been on chronic anticoagulation with warfarin, followed by dialysis although he was recently told he would need someone to follow it regularly. He was seen in the hospital 04/2017 for chest pain and acute heart failure. He was previously treated in [place] but no records available. Troponin was elevated in flat trend up to peak 1.07 felt related to CHF/CKD. 2D echo at that time showed mod LVH, EF 35-40%, akinesis of the inferior myocardium, grade 2 DD, mild AI/MR, severely dilated LA, mildly dilated RA. Other recent labs include INR 2.57 (10/2017), Hgb 9.5, WBC 12.8, plt 308, K 4.5, Cr 8.09; A1C 7.7, albumin 3.3, AST/ALT OK (08/2017), no recent lipids. Outpatient f/u was recommended but never occurred. He was seen for pre-op eval with anesthesia recently for upcoming I&D of R foot ulcer (with MAC anesthesia) who noted lack of OP f/u, prompting today's visit.  He returns for follow-up today reporting he is generally feeling poorly. He developed a low grade fever yesterday with malaise. He had tentatively planned for f/u with podiatry but states he is not followed by anyone else such as vascular or primary care. He reports episodic chest pain approximately 1x per week usually when laying down to rest described as a cramping sensation  associated with dyspnea. Also reports DOE and orthopnea. + some swelling of RLE. No palpitations or syncope. Temp 99.9 today with mild sinus tach 102. He feels chronically fatigued.   Past Medical History:  Diagnosis Date  . Anemia   . Chronic combined systolic and diastolic CHF (congestive heart failure) ([place])   . Diabetes ([place])   . ESRD (end stage renal disease) [facility])    [facility] [address] T, Th, Sat  . HIV disease ([place])   . Hyperparathyroidism, secondary ([place])   . Hypertension   . Hypertensive heart disease with end stage renal disease on dialysis ([place]) 03/13/2017  . SVT (supraventricular tachycardia) ([place])    ? afib or atrial flutter s/p TEE/DCCV with subsequent ablation due to reoccurrence in [place]  . Type 2 diabetes mellitus ([place])     Past Surgical History:  Procedure Laterality Date  . AMPUTATION Left    foot  . APPLICATION OF WOUND VAC Right 09/04/2017   Procedure: APPLICATION OF WOUND VAC;  Surgeon: [name], DPM;  Location: [place];  Service: Podiatry;  Laterality: Right;  . COLONOSCOPY WITH PROPOFOL N/A 07/30/2017   Procedure: COLONOSCOPY WITH PROPOFOL;  Surgeon: [name], MD;  Location: [place];  Service: Endoscopy;  Laterality: N/A;  . ESOPHAGOGASTRODUODENOSCOPY (EGD) WITH PROPOFOL N/A 07/28/2017   Procedure: ESOPHAGOGASTRODUODENOSCOPY (EGD) WITH PROPOFOL;  Surgeon: [name], MD;  Location: [facility];  Service: Endoscopy;  Laterality: N/A;  . FLEXIBLE SIGMOIDOSCOPY N/A 07/27/2017   Procedure: FLEXIBLE SIGMOIDOSCOPY;  Surgeon: [name], MD;  Location: [facility] ENDOSCOPY;  Service: Endoscopy;  Laterality: N/A;  . GRAFT APPLICATION Right 22/97/9892   Procedure: SKIN GRAFT APPLICATION RIGHT FOOT;  Surgeon: [name], DPM;  Location: [place];  Service: Podiatry;  Laterality: Right;  . I&D EXTREMITY Right 09/04/2017   Procedure: IRRIGATION AND DEBRIDEMENT EXTREMITY;  Surgeon: [name], DPM;  Location: [place];  Service: Podiatry;  Laterality:  Right;  . I&D EXTREMITY Right 11/12/2017   Procedure: IRRIGATION AND DEBRIDEMENT ULCER RIGHT FOOT;  Surgeon: [name], DPM;  Location: [place];  Service: Podiatry;  Laterality: Right;  . RIGHT AV FISTULA PLACEMENT  10/19/2012  . WOUND DEBRIDEMENT N/A 09/24/2017   Procedure: DEBRIDEMENT WOUND;  Surgeon: [name], DPM;  Location: [place];  Service: Podiatry;  Laterality: N/A;    Current Medications: Current Meds  Medication Sig  . amLODipine (NORVASC) 5 MG tablet Take 1 tablet (5 mg total) by mouth daily. (Patient taking differently: Take 5 mg by mouth every evening. )  . cinacalcet (SENSIPAR) 60 MG tablet Take 60 mg by mouth daily.  . clindamycin (CLEOCIN) 150 MG capsule Take 1 capsule (150 mg total) by mouth 3 (three) times daily.  Marland Kitchen doxercalciferol (HECTOROL) 4 MCG/2ML injection Inject 2.5 mLs (5 mcg total) into the vein Every Tuesday,Thursday,and Saturday with dialysis.  . ferric gluconate 125 mg in sodium chloride 0.9 % 100 mL Inject 125 mg into the vein Every Tuesday,Thursday,and Saturday with dialysis.  . ferrous sulfate 325 (65 FE) MG tablet Take 325 mg by mouth 3 (three) times daily with meals.  . fosamprenavir (LEXIVA) 700 MG tablet Take 700 mg by mouth 2 (two) times daily.   . insulin aspart (NOVOLOG) 100 UNIT/ML injection CBG 70 - 120: 0 units CBG 121 - 150: 3 units CBG 151 - 200: 4 units CBG 201 - 250: 7 units CBG 251 - 300: 11 units CBG 301 - 350: 15 units CBG 351 - 400: 20 units (Patient taking differently: Inject 0-20 Units into the skin 3 (three) times daily with meals. CBG 70 - 120: 0 units CBG 121 - 150: 3 units CBG 151 - 200: 4 units CBG 201 - 250: 7 units CBG 251 - 300: 11 units CBG 301 - 350: 15 units CBG 351 - 400: 20 units)  . insulin glargine (LANTUS) 100 UNIT/ML injection Inject 0.3 mLs (30 Units total) into the skin daily at 10 pm.  . lisinopril (PRINIVIL,ZESTRIL) 40 MG tablet Take 40 mg by mouth daily.  . metoprolol tartrate (LOPRESSOR) 25 MG  tablet Take 1 tablet (25 mg total) by mouth 2 (two) times daily.  . Multiple Vitamin (TAB-A-VITE PO) Take 1 tablet by mouth daily.  . raltegravir (ISENTRESS) 400 MG tablet Take 400 mg by mouth 2 (two) times daily.  . ritonavir (NORVIR) 100 MG capsule Take 100 mg by mouth daily with breakfast.  . sevelamer carbonate (RENVELA) 800 MG tablet Take 2,400 mg by mouth 3 (three) times daily with meals.   . Soft Lens Products (SALINE) 0.9 % SOLN Apply to gauze as part of saline wet to dry dressing  . warfarin (COUMADIN) 10 MG tablet Take 10 mg by mouth See admin instructions. Take 10 mg by mouth daily Sunday, Monday, Wednesday and Friday  . warfarin (COUMADIN) 7.5 MG tablet Take 7.5 mg by mouth See admin instructions. Take 7.5 mg by mouth daily on Tuesday, Thursday ans Saturday  . [DISCONTINUED] cephALEXin (KEFLEX) 500 MG capsule Take 1 capsule (500  mg total) 2 (two) times daily by mouth.  . [DISCONTINUED] oxyCODONE-acetaminophen (ROXICET) 5-325 MG tablet Take 1 tablet every 4 (four) hours as needed by mouth for severe pain.  . [DISCONTINUED] pantoprazole (PROTONIX) 40 MG tablet Take 1 tablet (40 mg total) by mouth 2 (two) times daily.     Allergies:   Patient has no known allergies.   Social History   Socioeconomic History  . Marital status: Married    Spouse name: kim  . Number of children: 3  . Years of education: college  . Highest education level: None  Social Needs  . Financial resource strain: None  . Food insecurity - worry: None  . Food insecurity - inability: None  . Transportation needs - medical: None  . Transportation needs - non-medical: None  Occupational History  . Occupation: disabled  Tobacco Use  . Smoking status: Never Smoker  . Smokeless tobacco: Never Used  Substance and Sexual Activity  . Alcohol use: No  . Drug use: No  . Sexual activity: None  Other Topics Concern  . None  Social History Narrative  . None     Family History:  Family History  Problem  Relation Age of Onset  . Heart disease Mother   . Diabetes Mother   . Heart disease Father   . Cancer Neg Hx     ROS:   Please see the history of present illness.  All other systems are reviewed and otherwise negative.    PHYSICAL EXAM:   VS:  BP 118/60   Pulse (!) 102   Temp 99.9 F (37.7 C) (Oral)   Resp 16   Ht 6' 6"  (1.981 m)   Wt 285 lb (129.3 kg)   BMI 32.94 kg/m   BMI: Body mass index is 32.94 kg/m. GEN: Well nourished, well developed chronically ill appearing AAM, in no acute distress  HEENT: normocephalic, atraumatic Neck: no JVD, carotid bruits, or masses Cardiac: RRR + S3, no rubs or murmurs, 1+ RLE edema - extremity not unwrapped as we do not have supplies to re-wrap in office Respiratory:  clear to auscultation bilaterally, normal work of breathing GI: soft, nontender, nondistended, + BS MS: no deformity or atrophy  Skin: warm and dry, no rash Neuro:  Alert and Oriented x 3, Strength and sensation are intact, follows commands Psych: euthymic mood, full affect  Wt Readings from Last 3 Encounters:  12/05/17 285 lb (129.3 kg)  11/12/17 285 lb (129.3 kg)  09/24/17 270 lb (122.5 kg)      Studies/Labs Reviewed:   EKG:  EKG was ordered today and personally reviewed by me and demonstrates sinus tach 1st degree AVB with occaisonal PACs, no acute ST-T changes.  Recent Labs: 05/05/2017: B Natriuretic Peptide 1,980.3 05/06/2017: Magnesium 2.1; TSH 0.520 09/03/2017: ALT 14 11/12/2017: BUN 33; Creatinine, Ser 8.09; Hemoglobin 9.5; Platelets 308; Potassium 4.2; Sodium 135   Lipid Panel    Component Value Date/Time   CHOL 141 03/10/2017 1217   TRIG 284 (H) 03/10/2017 1217   HDL 22 (L) 03/10/2017 1217   CHOLHDL 6.4 (H) 03/10/2017 1217   VLDL 57 (H) 03/10/2017 1217   LDLCALC 62 03/10/2017 1217    Additional studies/ records that were reviewed today include: Summarized above.    ASSESSMENT & PLAN:   1. Pre-operative evaluation - patient presented to discuss  pre-op eval, however, today reports malaise and fatigue with low grade temp. Given his known foot ulcer, HIV and ESRD with mild tachycardia I am concerned  this represents more significant infection. He does not currently follow with anyone else other than podiatry who I do not feel is equipped to evaluate this fully. In light of comorbidities, I have recommended he proceed to ED for further evaluation as he may require comprehensive workup to include blood cultures and possible antibiotic therapy. Given his history of LV dysfunction (unknown if ischemic or not), he is at baseline at least moderate risk for perioperative complications. RCRI calculated preliminarily at 48% risk of complication cardiac-wise, but we do not know the status of his coronaries. Would recommend while in the hospital to obtain stat records of [facility] heart cath and echo from approximately 2-3 years ago. If coronaries were normal, would recommend arrangement of Lexiscan nuclear stress test to risk stratify given troponin of 1 earlier this year. If coronaries were suggestive of ischemic heart disease, may need repeat heart cath prior to surgery. Obviously all of this will need to be taken in context of evaluation of possible infective process and necessary timing of procedures. Dr. Angelena Form has seen and examined the patient and agrees with the above. Hospital team will follow as inpatient. 2. Chronic combined CHF/cardiomyopathy - long-term would recommend weaning off amlodipine in lieu of titrating HF therapy, to include changing Lopressor to either Toprol or carvedilol, with possible addition of Imdur/hydralazine. However, given above concern for infection I'm not yet sure the trajectory of his BP, thus will hold off on any changes today pending further eval of his fever. 1. Arrhythmia - need to obtain records ASAP to review from [place] to determine what rhythm he exactly had. We will arrange coumadin f/u in our office. 2. Essential  HTN - will hold off on making BP med changes in case he develops sepsis. This can be addressed at a later date. BP controlled today.  Disposition: F/u with our clinic in approx 10-14 days to regroup.   Medication Adjustments/Labs and Tests Ordered: Current medicines are reviewed at length with the patient today.  Concerns regarding medicines are outlined above. Medication changes, Labs and Tests ordered today are summarized above and listed in the Patient Instructions accessible in Encounters.   Signed, Charlie Pitter, PA-C  12/05/2017 8:36 AM    [facility] [place], [place], [place]  [address] Phone: [phone]; Fax: [phone]

## 2017-12-04 NOTE — Progress Notes (Signed)
Subjective: Thomas [name] presents the office today for follow-up evaluation of a wound to the back of the right heel as well as the lateral right ankle.  He has been under the care of Dr. March Rummage over the last several months for the wound.  I am seeing him today as Dr. March Rummage pack the wound on Friday and he wanted to have the patient come in today for evaluation.  The patient states that he has noticed a lot of clear to bloody drainage on the bandage but he denies seeing any pus and he denies any increase in swelling or redness to his foot or leg.  He is currently not taking any antibiotics.  He has no other concerns today.  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 Denies any systemic complaints such as fevers, chills, nausea, vomiting. No acute changes since last appointment, and no other complaints at this time.   Objective: AAO x3, NAD patient does not seem interested in his care and seems annoyed that he is at the appointment today.;  He presents today with his wife (?)  Who was on her phone the entire visit and did not have any questions.  She seemed to be disengaged from his care at today's appointment but she does state she changes the bandages.  DP/PT pulses palpable but decreased bilaterally, CRT less than 3 seconds On the posterior aspect of the right heel is a large ulceration measuring 7 x 5 cm and it does probe approximately 1.5 cm in the posterior lateral direction.  On the lateral ankle there is another ulceration measuring approximately 2 x 2 cm and probes approximately 1 cm as well.  The wounds do not connect today but they were getting close.  Able to express a small amount of bloody drainage but is unable to identify any purulence.  There is no significant swelling to the foot and there is no erythema or increase in warmth.  There is no ascending cellulitis.  There is no fluctuation or crepitation. No open lesions or pre-ulcerative lesions.  No pain with calf compression,  swelling, warmth, erythema  Assessment: Large ulcerations right side  Plan: -All treatment options discussed with the patient including all alternatives, risks, complications.  -There was some loose tissue present to the wounds and I sharply debrided this today without any complications.  I read dress the wound after cleaning it thoroughly and I packed the wounds.  The patient's wife is been a change the dressing tomorrow remove the packing.  Given the large complexity of the wounds I have a feeling this patient's can end up with a below-knee amputation we discussed this.  He has significant vessel calcification on x-ray although he has had vascular studies recently. -If there is any signs or symptoms of infection he is to go immediately to the emergency room. -Follow-up on Wednesday with Dr. March Rummage. -Patient encouraged to call the office with any questions, concerns, change in symptoms.   Trula Slade DPM

## 2017-12-05 ENCOUNTER — Ambulatory Visit (INDEPENDENT_AMBULATORY_CARE_PROVIDER_SITE_OTHER): Payer: Self-pay | Admitting: Physician Assistant

## 2017-12-05 ENCOUNTER — Emergency Department (HOSPITAL_COMMUNITY): Payer: Medicare Other

## 2017-12-05 ENCOUNTER — Inpatient Hospital Stay (HOSPITAL_COMMUNITY)
Admission: EM | Admit: 2017-12-05 | Discharge: 2017-12-11 | DRG: 474 | Disposition: A | Discharge status: Rehabilitation Facility | Payer: Medicare Other | Attending: Internal Medicine | Admitting: Internal Medicine

## 2017-12-05 ENCOUNTER — Encounter (HOSPITAL_COMMUNITY): Payer: Self-pay

## 2017-12-05 ENCOUNTER — Other Ambulatory Visit: Payer: Self-pay

## 2017-12-05 ENCOUNTER — Encounter: Payer: Medicare Other | Admitting: Podiatry

## 2017-12-05 ENCOUNTER — Encounter: Payer: Self-pay | Admitting: Physician Assistant

## 2017-12-05 ENCOUNTER — Encounter (INDEPENDENT_AMBULATORY_CARE_PROVIDER_SITE_OTHER): Payer: Self-pay

## 2017-12-05 VITALS — BP 118/60 | HR 102 | Temp 99.9°F | Resp 16 | Ht 78.0 in | Wt 285.0 lb

## 2017-12-05 DIAGNOSIS — I471 Supraventricular tachycardia: Secondary | ICD-10-CM | POA: Diagnosis present

## 2017-12-05 DIAGNOSIS — D72829 Elevated white blood cell count, unspecified: Secondary | ICD-10-CM | POA: Diagnosis present

## 2017-12-05 DIAGNOSIS — Z794 Long term (current) use of insulin: Secondary | ICD-10-CM | POA: Diagnosis not present

## 2017-12-05 DIAGNOSIS — R791 Abnormal coagulation profile: Secondary | ICD-10-CM | POA: Diagnosis present

## 2017-12-05 DIAGNOSIS — I132 Hypertensive heart and chronic kidney disease with heart failure and with stage 5 chronic kidney disease, or end stage renal disease: Secondary | ICD-10-CM | POA: Diagnosis present

## 2017-12-05 DIAGNOSIS — G546 Phantom limb syndrome with pain: Secondary | ICD-10-CM | POA: Diagnosis not present

## 2017-12-05 DIAGNOSIS — I4892 Unspecified atrial flutter: Secondary | ICD-10-CM | POA: Diagnosis present

## 2017-12-05 DIAGNOSIS — G8918 Other acute postprocedural pain: Secondary | ICD-10-CM

## 2017-12-05 DIAGNOSIS — R509 Fever, unspecified: Secondary | ICD-10-CM

## 2017-12-05 DIAGNOSIS — L299 Pruritus, unspecified: Secondary | ICD-10-CM | POA: Diagnosis not present

## 2017-12-05 DIAGNOSIS — Z833 Family history of diabetes mellitus: Secondary | ICD-10-CM

## 2017-12-05 DIAGNOSIS — E1169 Type 2 diabetes mellitus with other specified complication: Secondary | ICD-10-CM | POA: Diagnosis present

## 2017-12-05 DIAGNOSIS — T148XXA Other injury of unspecified body region, initial encounter: Secondary | ICD-10-CM

## 2017-12-05 DIAGNOSIS — D631 Anemia in chronic kidney disease: Secondary | ICD-10-CM | POA: Diagnosis present

## 2017-12-05 DIAGNOSIS — E11621 Type 2 diabetes mellitus with foot ulcer: Secondary | ICD-10-CM | POA: Diagnosis present

## 2017-12-05 DIAGNOSIS — E1165 Type 2 diabetes mellitus with hyperglycemia: Secondary | ICD-10-CM | POA: Diagnosis present

## 2017-12-05 DIAGNOSIS — D638 Anemia in other chronic diseases classified elsewhere: Secondary | ICD-10-CM

## 2017-12-05 DIAGNOSIS — D62 Acute posthemorrhagic anemia: Secondary | ICD-10-CM

## 2017-12-05 DIAGNOSIS — M659 Synovitis and tenosynovitis, unspecified: Secondary | ICD-10-CM | POA: Diagnosis present

## 2017-12-05 DIAGNOSIS — Z89511 Acquired absence of right leg below knee: Secondary | ICD-10-CM

## 2017-12-05 DIAGNOSIS — M86271 Subacute osteomyelitis, right ankle and foot: Secondary | ICD-10-CM | POA: Diagnosis present

## 2017-12-05 DIAGNOSIS — L02611 Cutaneous abscess of right foot: Secondary | ICD-10-CM | POA: Diagnosis not present

## 2017-12-05 DIAGNOSIS — L97419 Non-pressure chronic ulcer of right heel and midfoot with unspecified severity: Secondary | ICD-10-CM | POA: Diagnosis present

## 2017-12-05 DIAGNOSIS — Z992 Dependence on renal dialysis: Secondary | ICD-10-CM | POA: Diagnosis not present

## 2017-12-05 DIAGNOSIS — E1152 Type 2 diabetes mellitus with diabetic peripheral angiopathy with gangrene: Secondary | ICD-10-CM | POA: Diagnosis present

## 2017-12-05 DIAGNOSIS — Z8679 Personal history of other diseases of the circulatory system: Secondary | ICD-10-CM

## 2017-12-05 DIAGNOSIS — E118 Type 2 diabetes mellitus with unspecified complications: Secondary | ICD-10-CM | POA: Diagnosis present

## 2017-12-05 DIAGNOSIS — W19XXXA Unspecified fall, initial encounter: Secondary | ICD-10-CM | POA: Diagnosis not present

## 2017-12-05 DIAGNOSIS — E669 Obesity, unspecified: Secondary | ICD-10-CM | POA: Diagnosis not present

## 2017-12-05 DIAGNOSIS — I5032 Chronic diastolic (congestive) heart failure: Secondary | ICD-10-CM

## 2017-12-05 DIAGNOSIS — I428 Other cardiomyopathies: Secondary | ICD-10-CM | POA: Diagnosis not present

## 2017-12-05 DIAGNOSIS — I5042 Chronic combined systolic (congestive) and diastolic (congestive) heart failure: Secondary | ICD-10-CM | POA: Diagnosis present

## 2017-12-05 DIAGNOSIS — E8889 Other specified metabolic disorders: Secondary | ICD-10-CM | POA: Diagnosis present

## 2017-12-05 DIAGNOSIS — R0902 Hypoxemia: Secondary | ICD-10-CM | POA: Diagnosis present

## 2017-12-05 DIAGNOSIS — Z21 Asymptomatic human immunodeficiency virus [HIV] infection status: Secondary | ICD-10-CM | POA: Diagnosis present

## 2017-12-05 DIAGNOSIS — E1122 Type 2 diabetes mellitus with diabetic chronic kidney disease: Secondary | ICD-10-CM | POA: Diagnosis present

## 2017-12-05 DIAGNOSIS — I10 Essential (primary) hypertension: Secondary | ICD-10-CM | POA: Diagnosis not present

## 2017-12-05 DIAGNOSIS — Z7901 Long term (current) use of anticoagulants: Secondary | ICD-10-CM

## 2017-12-05 DIAGNOSIS — B20 Human immunodeficiency virus [HIV] disease: Secondary | ICD-10-CM | POA: Diagnosis present

## 2017-12-05 DIAGNOSIS — N186 End stage renal disease: Secondary | ICD-10-CM

## 2017-12-05 DIAGNOSIS — N2581 Secondary hyperparathyroidism of renal origin: Secondary | ICD-10-CM | POA: Diagnosis present

## 2017-12-05 DIAGNOSIS — Z89512 Acquired absence of left leg below knee: Secondary | ICD-10-CM

## 2017-12-05 DIAGNOSIS — E1140 Type 2 diabetes mellitus with diabetic neuropathy, unspecified: Secondary | ICD-10-CM | POA: Diagnosis present

## 2017-12-05 DIAGNOSIS — S88111D Complete traumatic amputation at level between knee and ankle, right lower leg, subsequent encounter: Secondary | ICD-10-CM | POA: Diagnosis not present

## 2017-12-05 DIAGNOSIS — Z79899 Other long term (current) drug therapy: Secondary | ICD-10-CM

## 2017-12-05 DIAGNOSIS — I480 Paroxysmal atrial fibrillation: Secondary | ICD-10-CM | POA: Diagnosis present

## 2017-12-05 DIAGNOSIS — E1142 Type 2 diabetes mellitus with diabetic polyneuropathy: Secondary | ICD-10-CM | POA: Diagnosis present

## 2017-12-05 DIAGNOSIS — R195 Other fecal abnormalities: Secondary | ICD-10-CM | POA: Diagnosis not present

## 2017-12-05 DIAGNOSIS — L089 Local infection of the skin and subcutaneous tissue, unspecified: Secondary | ICD-10-CM | POA: Diagnosis present

## 2017-12-05 DIAGNOSIS — R61 Generalized hyperhidrosis: Secondary | ICD-10-CM | POA: Diagnosis present

## 2017-12-05 DIAGNOSIS — Z8249 Family history of ischemic heart disease and other diseases of the circulatory system: Secondary | ICD-10-CM

## 2017-12-05 DIAGNOSIS — Z01810 Encounter for preprocedural cardiovascular examination: Secondary | ICD-10-CM

## 2017-12-05 DIAGNOSIS — M869 Osteomyelitis, unspecified: Secondary | ICD-10-CM | POA: Diagnosis not present

## 2017-12-05 DIAGNOSIS — M86179 Other acute osteomyelitis, unspecified ankle and foot: Secondary | ICD-10-CM | POA: Diagnosis not present

## 2017-12-05 DIAGNOSIS — Z4781 Encounter for orthopedic aftercare following surgical amputation: Secondary | ICD-10-CM | POA: Diagnosis present

## 2017-12-05 DIAGNOSIS — I96 Gangrene, not elsewhere classified: Secondary | ICD-10-CM | POA: Diagnosis present

## 2017-12-05 DIAGNOSIS — I4891 Unspecified atrial fibrillation: Secondary | ICD-10-CM | POA: Diagnosis not present

## 2017-12-05 LAB — CBC WITH DIFFERENTIAL/PLATELET
Basophils Absolute: 0 10*3/uL (ref 0.0–0.1)
Basophils Relative: 0 %
EOS ABS: 0.2 10*3/uL (ref 0.0–0.7)
EOS PCT: 2 %
HCT: 26.6 % — ABNORMAL LOW (ref 39.0–52.0)
Hemoglobin: 8.4 g/dL — ABNORMAL LOW (ref 13.0–17.0)
LYMPHS ABS: 1.2 10*3/uL (ref 0.7–4.0)
LYMPHS PCT: 8 %
MCH: 29.7 pg (ref 26.0–34.0)
MCHC: 31.6 g/dL (ref 30.0–36.0)
MCV: 94 fL (ref 78.0–100.0)
MONO ABS: 1.5 10*3/uL — AB (ref 0.1–1.0)
MONOS PCT: 11 %
Neutro Abs: 11.1 10*3/uL — ABNORMAL HIGH (ref 1.7–7.7)
Neutrophils Relative %: 79 %
PLATELETS: 314 10*3/uL (ref 150–400)
RBC: 2.83 MIL/uL — AB (ref 4.22–5.81)
RDW: 16.6 % — AB (ref 11.5–15.5)
WBC: 14 10*3/uL — AB (ref 4.0–10.5)

## 2017-12-05 LAB — COMPREHENSIVE METABOLIC PANEL
ALBUMIN: 2.6 g/dL — AB (ref 3.5–5.0)
ALT: 14 U/L — AB (ref 17–63)
AST: 15 U/L (ref 15–41)
Alkaline Phosphatase: 102 U/L (ref 38–126)
Anion gap: 14 (ref 5–15)
BUN: 30 mg/dL — ABNORMAL HIGH (ref 6–20)
CHLORIDE: 94 mmol/L — AB (ref 101–111)
CO2: 24 mmol/L (ref 22–32)
CREATININE: 6.71 mg/dL — AB (ref 0.61–1.24)
Calcium: 8.6 mg/dL — ABNORMAL LOW (ref 8.9–10.3)
GFR calc Af Amer: 10 mL/min — ABNORMAL LOW (ref >60)
GFR, EST NON AFRICAN AMERICAN: 9 mL/min — AB (ref >60)
GLUCOSE: 315 mg/dL — AB (ref 65–99)
POTASSIUM: 3.9 mmol/L (ref 3.5–5.1)
Sodium: 132 mmol/L — ABNORMAL LOW (ref 135–145)
Total Bilirubin: 0.9 mg/dL (ref 0.3–1.2)
Total Protein: 7.7 g/dL (ref 6.5–8.1)

## 2017-12-05 LAB — GLUCOSE, CAPILLARY: GLUCOSE-CAPILLARY: 306 mg/dL — AB (ref 65–99)

## 2017-12-05 LAB — I-STAT TROPONIN, ED: TROPONIN I, POC: 0.12 ng/mL — AB (ref 0.00–0.08)

## 2017-12-05 LAB — PROTIME-INR
INR: 3.46
Prothrombin Time: 34.5 seconds — ABNORMAL HIGH (ref 11.4–15.2)

## 2017-12-05 LAB — I-STAT CG4 LACTIC ACID, ED
LACTIC ACID, VENOUS: 1.21 mmol/L (ref 0.5–1.9)
Lactic Acid, Venous: 1.33 mmol/L (ref 0.5–1.9)

## 2017-12-05 MED ORDER — ACETAMINOPHEN 650 MG RE SUPP: 650.0000 mg | Freq: Four times a day (QID) | RECTAL | Status: DC | PRN

## 2017-12-05 MED ORDER — VANCOMYCIN HCL IN DEXTROSE 1-5 GM/200ML-% IV SOLN: 1000.0000 mg | INTRAVENOUS | Status: DC

## 2017-12-05 MED ORDER — PIPERACILLIN-TAZOBACTAM 3.375 G IVPB
3.3750 g | Freq: Two times a day (BID) | INTRAVENOUS | Status: DC
  Administered 2017-12-05 – 2017-12-07 (×3): 3.375 g via INTRAVENOUS
  Filled 2017-12-05 (×4): qty 50

## 2017-12-05 MED ORDER — WARFARIN SODIUM 7.5 MG PO TABS: 7.5000 mg | ORAL_TABLET | ORAL | Status: DC

## 2017-12-05 MED ORDER — INSULIN ASPART 100 UNIT/ML ~~LOC~~ SOLN
0.0000 [IU] | Freq: Three times a day (TID) | SUBCUTANEOUS | Status: DC
Start: 2017-12-06 — End: 2017-12-11
  Administered 2017-12-06 – 2017-12-07 (×3): 7 [IU] via SUBCUTANEOUS
  Administered 2017-12-08: 15 [IU] via SUBCUTANEOUS
  Administered 2017-12-08: 7 [IU] via SUBCUTANEOUS
  Administered 2017-12-08: 4 [IU] via SUBCUTANEOUS
  Administered 2017-12-09: 15 [IU] via SUBCUTANEOUS
  Administered 2017-12-09: 3 [IU] via SUBCUTANEOUS
  Administered 2017-12-10: 4 [IU] via SUBCUTANEOUS
  Administered 2017-12-10: 7 [IU] via SUBCUTANEOUS
  Administered 2017-12-10: 4 [IU] via SUBCUTANEOUS
  Administered 2017-12-11 (×2): 3 [IU] via SUBCUTANEOUS

## 2017-12-05 MED ORDER — PENTAFLUOROPROP-TETRAFLUOROETH EX AERO
1.0000 | INHALATION_SPRAY | CUTANEOUS | Status: DC | PRN
Start: 2017-12-05 — End: 2017-12-08

## 2017-12-05 MED ORDER — WARFARIN SODIUM 10 MG PO TABS: 10.0000 mg | ORAL_TABLET | Freq: Every day | ORAL | Status: DC

## 2017-12-05 MED ORDER — AMLODIPINE BESYLATE 5 MG PO TABS
5.0000 mg | ORAL_TABLET | Freq: Every evening | ORAL | Status: DC
  Administered 2017-12-05 – 2017-12-11 (×7): 5 mg via ORAL
  Filled 2017-12-05 (×7): qty 1

## 2017-12-05 MED ORDER — DARBEPOETIN ALFA 200 MCG/0.4ML IJ SOSY
200.0000 ug | PREFILLED_SYRINGE | INTRAMUSCULAR | Status: DC
  Administered 2017-12-06: 200 ug via INTRAVENOUS
  Filled 2017-12-05: qty 0.4

## 2017-12-05 MED ORDER — INSULIN GLARGINE 100 UNIT/ML ~~LOC~~ SOLN
10.0000 [IU] | Freq: Every day | SUBCUTANEOUS | Status: DC
  Administered 2017-12-05 – 2017-12-06 (×2): 10 [IU] via SUBCUTANEOUS
  Filled 2017-12-05 (×2): qty 0.1

## 2017-12-05 MED ORDER — RALTEGRAVIR POTASSIUM 400 MG PO TABS
400.0000 mg | ORAL_TABLET | Freq: Two times a day (BID) | ORAL | Status: DC
  Administered 2017-12-05 – 2017-12-11 (×10): 400 mg via ORAL
  Filled 2017-12-05 (×13): qty 1

## 2017-12-05 MED ORDER — POVIDONE-IODINE 10 % EX SWAB: 2.0000 "application " | Freq: Once | CUTANEOUS | Status: DC

## 2017-12-05 MED ORDER — LISINOPRIL 40 MG PO TABS
40.0000 mg | ORAL_TABLET | Freq: Every day | ORAL | Status: DC
  Administered 2017-12-05 – 2017-12-11 (×6): 40 mg via ORAL
  Filled 2017-12-05 (×6): qty 1

## 2017-12-05 MED ORDER — VANCOMYCIN HCL IN DEXTROSE 1-5 GM/200ML-% IV SOLN
1000.0000 mg | INTRAVENOUS | Status: DC
  Administered 2017-12-06: 1000 mg via INTRAVENOUS
  Filled 2017-12-05 (×2): qty 200

## 2017-12-05 MED ORDER — FOSAMPRENAVIR CALCIUM 700 MG PO TABS
700.0000 mg | ORAL_TABLET | Freq: Two times a day (BID) | ORAL | Status: DC
  Administered 2017-12-07 – 2017-12-11 (×9): 700 mg via ORAL
  Filled 2017-12-05 (×10): qty 1

## 2017-12-05 MED ORDER — METOPROLOL TARTRATE 25 MG PO TABS
25.0000 mg | ORAL_TABLET | Freq: Two times a day (BID) | ORAL | Status: DC
  Administered 2017-12-05 – 2017-12-10 (×9): 25 mg via ORAL
  Filled 2017-12-05 (×10): qty 1

## 2017-12-05 MED ORDER — SODIUM CHLORIDE 0.9 % IV SOLN
100.0000 mL | INTRAVENOUS | Status: DC | PRN
  Administered 2017-12-06: 08:00:00 via INTRAVENOUS

## 2017-12-05 MED ORDER — PIPERACILLIN-TAZOBACTAM 3.375 G IVPB 30 MIN
3.3750 g | Freq: Once | INTRAVENOUS | Status: AC
  Administered 2017-12-05: 3.375 g via INTRAVENOUS
  Filled 2017-12-05: qty 50

## 2017-12-05 MED ORDER — VANCOMYCIN HCL 10 G IV SOLR
2500.0000 mg | Freq: Once | INTRAVENOUS | Status: AC
  Administered 2017-12-05: 2500 mg via INTRAVENOUS
  Filled 2017-12-05 (×2): qty 2500

## 2017-12-05 MED ORDER — LIDOCAINE-PRILOCAINE 2.5-2.5 % EX CREA: 1.0000 "application " | TOPICAL_CREAM | CUTANEOUS | Status: DC | PRN

## 2017-12-05 MED ORDER — LIDOCAINE HCL (PF) 1 % IJ SOLN: 5.0000 mL | INTRAMUSCULAR | Status: DC | PRN

## 2017-12-05 MED ORDER — PHYTONADIONE 5 MG PO TABS
2.5000 mg | ORAL_TABLET | Freq: Once | ORAL | Status: AC
  Administered 2017-12-05: 2.5 mg via ORAL
  Filled 2017-12-05: qty 1

## 2017-12-05 MED ORDER — DEXTROSE 5 % IV SOLN
3.0000 g | INTRAVENOUS | Status: AC
  Administered 2017-12-06: 3 g via INTRAVENOUS
  Filled 2017-12-05: qty 3000

## 2017-12-05 MED ORDER — CHLORHEXIDINE GLUCONATE 4 % EX LIQD
60.0000 mL | Freq: Once | CUTANEOUS | Status: AC
  Administered 2017-12-06: 4 via TOPICAL
  Filled 2017-12-05: qty 60

## 2017-12-05 MED ORDER — SENNOSIDES-DOCUSATE SODIUM 8.6-50 MG PO TABS
1.0000 | ORAL_TABLET | Freq: Every evening | ORAL | Status: DC | PRN
  Administered 2017-12-07: 1 via ORAL
  Filled 2017-12-05: qty 1

## 2017-12-05 MED ORDER — RITONAVIR 100 MG PO CAPS
100.0000 mg | ORAL_CAPSULE | Freq: Every day | ORAL | Status: DC
  Administered 2017-12-07 – 2017-12-11 (×5): 100 mg via ORAL
  Filled 2017-12-05 (×10): qty 1

## 2017-12-05 MED ORDER — DOXERCALCIFEROL 4 MCG/2ML IV SOLN
5.0000 ug | INTRAVENOUS | Status: DC
  Administered 2017-12-06 – 2017-12-11 (×3): 5 ug via INTRAVENOUS
  Filled 2017-12-05 (×3): qty 4

## 2017-12-05 MED ORDER — SODIUM CHLORIDE 0.9 % IV SOLN: 100.0000 mL | INTRAVENOUS | Status: DC | PRN

## 2017-12-05 MED ORDER — ACETAMINOPHEN 325 MG PO TABS: 650.0000 mg | ORAL_TABLET | Freq: Four times a day (QID) | ORAL | Status: DC | PRN

## 2017-12-05 NOTE — ED Notes (Signed)
RENAL DIET ORDERED 

## 2017-12-05 NOTE — Progress Notes (Signed)
Spoke with Trish in cath lab, she and Anderson Malta will work on obtaining records from [facility] PA-C

## 2017-12-05 NOTE — H&P (Signed)
Date: 12/05/2017               Patient Name:  Thomas [name] MRN: [id number]  DOB: 1966-03-06 Age / Sex: 51 y.o., male   PCP: Patient, No Pcp Per              Medical Service: Internal Medicine Teaching Service              Attending Physician: Dr. Tanna Furry, MD    First Contact: Louann Liv, MS3 Pager: [phone]  Second Contact: Dr. Ronalee Red Pager: [phone]  Third Contact Dr. Heber [place] Pager: [phone]       After Hours (After 5p/  First Contact Pager: [phone]  weekends / holidays): Second Contact Pager: [phone]   Chief Complaint: Worsening right foot wound  History of Present Illness: Patient is a 51 year old male with PMH significant for T2DM, ESRD, HIV, CHF, and s/p left BKA who presents today with a worsening right foot wound. He has been seen by a podiatrist for the past two months for wound care. He was sent to the ED today by his cardiologist who felt he needed further evaluation for recent fever and progressive wound breakdown. Patient denies pain in his right lower extremity. He has felt feverish for the past few days. Denies N/V, CP, SOB. He and his wife are very frustrated about his wound progression and the care they have received.        Meds: Current Facility-Administered Medications  Medication Dose Route Frequency Provider Last Rate Last Dose  . vancomycin (VANCOCIN) 2,500 mg in sodium chloride 0.9 % 500 mL IVPB  2,500 mg Intravenous Once Rumbarger, Rachel L, RPH 250 mL/hr at 12/05/17 1441 2,500 mg at 12/05/17 1441  . [START ON 12/06/2017] vancomycin (VANCOCIN) IVPB 1000 mg/200 mL premix  1,000 mg Intravenous Q T,Th,Sa-HD Rumbarger, Valeda Malm, RPH       Current Outpatient Medications  Medication Sig Dispense Refill  . amLODipine (NORVASC) 5 MG tablet Take 1 tablet (5 mg total) by mouth daily. (Patient taking differently: Take 5 mg by mouth every evening. ) 30 tablet 0  . cinacalcet (SENSIPAR) 60 MG tablet Take 60 mg by mouth daily.    . clindamycin (CLEOCIN) 150 MG capsule  Take 1 capsule (150 mg total) by mouth 3 (three) times daily. 21 capsule 0  . doxercalciferol (HECTOROL) 4 MCG/2ML injection Inject 2.5 mLs (5 mcg total) into the vein Every Tuesday,Thursday,and Saturday with dialysis. 2 mL   . ferric gluconate 125 mg in sodium chloride 0.9 % 100 mL Inject 125 mg into the vein Every Tuesday,Thursday,and Saturday with dialysis.    . fosamprenavir (LEXIVA) 700 MG tablet Take 700 mg by mouth 2 (two) times daily.     . insulin aspart (NOVOLOG) 100 UNIT/ML injection CBG 70 - 120: 0 units CBG 121 - 150: 3 units CBG 151 - 200: 4 units CBG 201 - 250: 7 units CBG 251 - 300: 11 units CBG 301 - 350: 15 units CBG 351 - 400: 20 units (Patient taking differently: Inject 0-20 Units into the skin 3 (three) times daily with meals. CBG 70 - 120: 0 units CBG 121 - 150: 3 units CBG 151 - 200: 4 units CBG 201 - 250: 7 units CBG 251 - 300: 11 units CBG 301 - 350: 15 units CBG 351 - 400: 20 units) 10 mL 11  . insulin glargine (LANTUS) 100 UNIT/ML injection Inject 0.3 mLs (30 Units total) into the skin daily at 10 pm. 10 mL 11  .  lisinopril (PRINIVIL,ZESTRIL) 40 MG tablet Take 40 mg by mouth daily.    . metoprolol tartrate (LOPRESSOR) 25 MG tablet Take 1 tablet (25 mg total) by mouth 2 (two) times daily. 60 tablet 0  . Multiple Vitamin (TAB-A-VITE PO) Take 1 tablet by mouth daily.    . raltegravir (ISENTRESS) 400 MG tablet Take 400 mg by mouth 2 (two) times daily.    . ritonavir (NORVIR) 100 MG capsule Take 100 mg by mouth daily with breakfast.    . sevelamer carbonate (RENVELA) 800 MG tablet Take 2,400 mg by mouth 3 (three) times daily with meals.     . warfarin (COUMADIN) 10 MG tablet Take 10 mg by mouth See admin instructions. Take 10 mg by mouth daily Sunday, Monday, Wednesday and Friday    . warfarin (COUMADIN) 7.5 MG tablet Take 7.5 mg by mouth See admin instructions. Take 7.5 mg by mouth daily on Tuesday, Thursday ans Saturday    . Soft Lens Products (SALINE) 0.9 % SOLN  Apply to gauze as part of saline wet to dry dressing 1 Bottle 0    Allergies: Allergies as of 12/05/2017  . (No Known Allergies)   Past Medical History:  Diagnosis Date  . Anemia   . Chronic combined systolic and diastolic CHF (congestive heart failure) ([place])   . Diabetes ([place])   . ESRD (end stage renal disease) [facility])    [place] [address] T, Th, Sat  . HIV disease ([place])   . Hyperparathyroidism, secondary ([place])   . Hypertension   . Hypertensive heart disease with end stage renal disease on dialysis ([place]) 03/13/2017  . SVT (supraventricular tachycardia) ([place])    ? afib or atrial flutter s/p TEE/DCCV with subsequent ablation due to reoccurrence in [place]  . Type 2 diabetes mellitus ([place])    Past Surgical History:  Procedure Laterality Date  . AMPUTATION Left    foot  . APPLICATION OF WOUND VAC Right 09/04/2017   Procedure: APPLICATION OF WOUND VAC;  Surgeon: [name], DPM;  Location: [place];  Service: Podiatry;  Laterality: Right;  . COLONOSCOPY WITH PROPOFOL N/A 07/30/2017   Procedure: COLONOSCOPY WITH PROPOFOL;  Surgeon: [name], MD;  Location: [place];  Service: Endoscopy;  Laterality: N/A;  . ESOPHAGOGASTRODUODENOSCOPY (EGD) WITH PROPOFOL N/A 07/28/2017   Procedure: ESOPHAGOGASTRODUODENOSCOPY (EGD) WITH PROPOFOL;  Surgeon: [name], MD;  Location: [place];  Service: Endoscopy;  Laterality: N/A;  . FLEXIBLE SIGMOIDOSCOPY N/A 07/27/2017   Procedure: FLEXIBLE SIGMOIDOSCOPY;  Surgeon: [name], MD;  Location[facility] END[facility]COPY;  Service: Endoscopy;  Laterality: N/A;  . GRAFT APPLICATION Right 42/59/5638   Procedure: SKIN GRAFT APPLICATION RIGHT FOOT;  Surgeon: [name], DPM;  Location: [place];  Service: Podiatry;  Laterality: Right;  . I&D EXTREMITY Right 09/04/2017   Procedure: IRRIGATION AND DEBRIDEMENT EXTREMITY;  Surgeon: [name], DPM;  Location: [place];  Service: Podiatry;  Laterality: Right;  . I&D EXTREMITY Right 11/12/2017    Procedure: IRRIGATION AND DEBRIDEMENT ULCER RIGHT FOOT;  Surgeon: [name], DPM;  Location: [place];  Service: Podiatry;  Laterality: Right;  . RIGHT AV FISTULA PLACEMENT  10/19/2012  . WOUND DEBRIDEMENT N/A 09/24/2017   Procedure: DEBRIDEMENT WOUND;  Surgeon: [name], DPM;  Location: [place];  Service: Podiatry;  Laterality: N/A;   Family History  Problem Relation Age of Onset  . Heart disease Mother   . Diabetes Mother   . Heart disease Father   . Cancer Neg Hx  Social history: Patient lives in [place] with his wife and mother-in-law. He denies smoking, alcohol use, or other drug use.   Review of Systems: Pertinent items noted in HPI and remainder of comprehensive ROS otherwise negative.  Physical Exam: Blood pressure 121/79, pulse 89, temperature 99.1 F (37.3 C), temperature source Oral, resp. rate 18, SpO2 95 %. Physical Exam  Constitutional: He is oriented to person, place, and time.  Well-developed, lying on stretcher in ED. Visibly upset.   HENT:  Head: Normocephalic and atraumatic.  Mouth/Throat: Oropharynx is clear and moist.  Eyes: Pupils are equal, round, and reactive to light.  Cardiovascular: Normal rate and regular rhythm. Exam reveals no gallop and no friction rub.  No murmur heard. Absent RLE pulse  Pulmonary/Chest: Effort normal and breath sounds normal. No respiratory distress. He has no wheezes.  Abdominal:  Soft. Bowel sounds present.   Musculoskeletal:  Left BKA. Right heel with large, draining ulcers on plantar and lateral surfaces. Smaller ulcer on medial surface.   Lymphadenopathy:    He has no cervical adenopathy.  Neurological: He is alert and oriented to person, place, and time.  Skin: Skin is warm and dry.   Assessment & Plan by Problem: Active Problems:   * No active hospital problems. *  Mr. Coia is a 51 year old male with PMH of ESRD, T2DM, HIV, CHF who presents today with worsening right foot wounds and new fever,  concerning for cellulitis vs osteomyelitis.   Right foot wound Seen by ortho in the ED and it was determined that a transtibial amputation was the patient's only option. CT foot completed, MRI cancelled. He is scheduled for surgery in the morning. He received vancomycin and piperacillin-tazobactam in the ED and will await post-op recommendations for further antibiotics.  - Follow-up blood cultures x2  ESRD 2/2 T2DM Hemodialysis on Tu/Th/Sat. Is scheduled to go tomorrow. - Hemodialysis orders per nephrology - Continue home medication regimen  CHF, HTN - Continue home medications - INR tomorrow for warfarin  HIV Continue home antiretroviral regimen.   This is a Careers information officer Note.  The care of the patient was discussed with Dr. Ronalee Red and the assessment and plan was formulated with their assistance.  Please see their note for official documentation of the patient encounter.   Signed: Louann Liv, Medical Student 12/05/2017, 3:12 PM

## 2017-12-05 NOTE — Consult Note (Signed)
Cardiology Office Note    Date:  12/05/2017  ID:  Thomas [name], DOB 1966-11-13, MRN [id number] PCP:  Patient, No Pcp Per  Cardiologist:  Dr. [name]   Chief Complaint: f/u CHF  History of Present Illness:  Thomas [name] is a 51 y.o. male with history of HIV, DM, ESRD on HD, HTN, questionable prior atrial fib or flutter or SVT, L BKA, secondary hyperparathyroidism, anemia, chronic combined CHF who presents for f/u.  He established care with Dr. [name] 02/2017. Prior cardiac care was in [place]. He had some type of SVT and had what sounds like from his description as afib or flutter and had a TEE/DCCV and about 2 weeks later had reoccurrence of the arrhythmia and had an ablation. He had been on chronic anticoagulation with warfarin, followed by dialysis although he was recently told he would need someone to follow it regularly. He was seen in the hospital 04/2017 for chest pain and acute heart failure. He was previously treated in [place] but no records available. Troponin was elevated in flat trend up to peak 1.07 felt related to CHF/CKD. 2D echo at that time showed mod LVH, EF 35-40%, akinesis of the inferior myocardium, grade 2 DD, mild AI/MR, severely dilated LA, mildly dilated RA. Other recent labs include INR 2.57 (10/2017), Hgb 9.5, WBC 12.8, plt 308, K 4.5, Cr 8.09; A1C 7.7, albumin 3.3, AST/ALT OK (08/2017), no recent lipids. Outpatient f/u was recommended but never occurred. He was seen for pre-op eval with anesthesia recently for upcoming I&D of R foot ulcer (with MAC anesthesia) who noted lack of OP f/u, prompting today's visit.  He returns for follow-up today reporting he is generally feeling poorly. He developed a low grade fever yesterday with malaise. He had tentatively planned for f/u with podiatry but states he is not followed by anyone else such as vascular or primary care. He reports episodic chest pain approximately 1x per week usually when laying down to rest described as a cramping sensation  associated with dyspnea. Also reports DOE and orthopnea. + some swelling of RLE. No palpitations or syncope. Temp 99.9 today with mild sinus tach 102. He feels chronically fatigued.   Past Medical History:  Diagnosis Date  . Anemia   . Chronic combined systolic and diastolic CHF (congestive heart failure) ([place])   . Diabetes ([place])   . ESRD (end stage renal disease) [facility])    [place] [address] T, Th, Sat  . HIV disease ([place])   . Hyperparathyroidism, secondary ([place])   . Hypertension   . Hypertensive heart disease with end stage renal disease on dialysis ([place]) 03/13/2017  . SVT (supraventricular tachycardia) ([place])    ? afib or atrial flutter s/p TEE/DCCV with subsequent ablation due to reoccurrence in [place]  . Type 2 diabetes mellitus ([place])     Past Surgical History:  Procedure Laterality Date  . AMPUTATION Left    foot  . APPLICATION OF WOUND VAC Right 09/04/2017   Procedure: APPLICATION OF WOUND VAC;  Surgeon: [name], DPM;  Location: [place];  Service: Podiatry;  Laterality: Right;  . COLONOSCOPY WITH PROPOFOL N/A 07/30/2017   Procedure: COLONOSCOPY WITH PROPOFOL;  Surgeon: [name], MD;  Location: [place];  Service: Endoscopy;  Laterality: N/A;  . ESOPHAGOGASTRODUODENOSCOPY (EGD) WITH PROPOFOL N/A 07/28/2017   Procedure: ESOPHAGOGASTRODUODENOSCOPY (EGD) WITH PROPOFOL;  Surgeon: [name], MD;  Location: [place];  Service: Endoscopy;  Laterality: N/A;  . FLEXIBLE SIGMOIDOSCOPY N/A 07/27/2017   Procedure: FLEXIBLE SIGMOIDOSCOPY;  Surgeon: [name], MD;  Location: [facility] ENDOSCOPY;  Service: Endoscopy;  Laterality: N/A;  . GRAFT APPLICATION Right 81/85/6314   Procedure: SKIN GRAFT APPLICATION RIGHT FOOT;  Surgeon: [name], DPM;  Location: [place];  Service: Podiatry;  Laterality: Right;  . I&D EXTREMITY Right 09/04/2017   Procedure: IRRIGATION AND DEBRIDEMENT EXTREMITY;  Surgeon: [name], DPM;  Location: [place];  Service: Podiatry;  Laterality:  Right;  . I&D EXTREMITY Right 11/12/2017   Procedure: IRRIGATION AND DEBRIDEMENT ULCER RIGHT FOOT;  Surgeon: [name], DPM;  Location: [place];  Service: Podiatry;  Laterality: Right;  . RIGHT AV FISTULA PLACEMENT  10/19/2012  . WOUND DEBRIDEMENT N/A 09/24/2017   Procedure: DEBRIDEMENT WOUND;  Surgeon: [name], DPM;  Location: [place];  Service: Podiatry;  Laterality: N/A;    Current Medications: No outpatient medications have been marked as taking for the 12/05/17 encounter [facility] Encounter).     Allergies:   Patient has no known allergies.   Social History   Socioeconomic History  . Marital status: Married    Spouse name: kim  . Number of children: 3  . Years of education: college  . Highest education level: None  Social Needs  . Financial resource strain: None  . Food insecurity - worry: None  . Food insecurity - inability: None  . Transportation needs - medical: None  . Transportation needs - non-medical: None  Occupational History  . Occupation: disabled  Tobacco Use  . Smoking status: Never Smoker  . Smokeless tobacco: Never Used  Substance and Sexual Activity  . Alcohol use: No  . Drug use: No  . Sexual activity: None  Other Topics Concern  . None  Social History Narrative  . None     Family History:  Family History  Problem Relation Age of Onset  . Heart disease Mother   . Diabetes Mother   . Heart disease Father   . Cancer Neg Hx     ROS:   Please see the history of present illness.  All other systems are reviewed and otherwise negative.    PHYSICAL EXAM:   VS:  BP 128/69 (BP Location: Left Arm)   Pulse 97   Temp 99.1 F (37.3 C) (Oral)   Resp 18   SpO2 98%   BMI: There is no height or weight on file to calculate BMI. GEN: Well nourished, well developed chronically ill appearing AAM, in no acute distress  HEENT: normocephalic, atraumatic Neck: no JVD, carotid bruits, or masses Cardiac: RRR + S3, no rubs or murmurs, 1+ RLE edema  - extremity not unwrapped as we do not have supplies to re-wrap in office Respiratory:  clear to auscultation bilaterally, normal work of breathing GI: soft, nontender, nondistended, + BS MS: no deformity or atrophy  Skin: warm and dry, no rash Neuro:  Alert and Oriented x 3, Strength and sensation are intact, follows commands Psych: euthymic mood, full affect  Wt Readings from Last 3 Encounters:  12/05/17 285 lb (129.3 kg)  11/12/17 285 lb (129.3 kg)  09/24/17 270 lb (122.5 kg)      Studies/Labs Reviewed:   EKG:  EKG was ordered today and personally reviewed by me and demonstrates sinus tach 1st degree AVB with occaisonal PACs, no acute ST-T changes.  Recent Labs: 05/05/2017: B Natriuretic Peptide 1,980.3 05/06/2017: Magnesium 2.1; TSH 0.520 09/03/2017: ALT 14 11/12/2017: BUN 33; Creatinine, Ser 8.09; Hemoglobin 9.5; Platelets 308; Potassium 4.2; Sodium 135   Lipid Panel  Component Value Date/Time   CHOL 141 03/10/2017 1217   TRIG 284 (H) 03/10/2017 1217   HDL 22 (L) 03/10/2017 1217   CHOLHDL 6.4 (H) 03/10/2017 1217   VLDL 57 (H) 03/10/2017 1217   LDLCALC 62 03/10/2017 1217    Additional studies/ records that were reviewed today include: Summarized above.    ASSESSMENT & PLAN:   1. Pre-operative evaluation - patient presented to discuss pre-op eval, however, today reports malaise and fatigue with low grade temp. Given his known foot ulcer, HIV and ESRD with mild tachycardia I am concerned this represents more significant infection. He does not currently follow with anyone else other than podiatry who I do not feel is equipped to evaluate this fully. In light of comorbidities, I have recommended he proceed to ED for further evaluation as he may require comprehensive workup to include blood cultures and possible antibiotic therapy. Given his history of LV dysfunction (unknown if ischemic or not), he is at baseline at least moderate risk for perioperative complications. RCRI  calculated preliminarily at 80% risk of complication cardiac-wise, but we do not know the status of his coronaries. Would recommend while in the hospital to obtain stat records of [facility] heart cath and echo from approximately 2-3 years ago. If coronaries were normal, would recommend arrangement of Lexiscan nuclear stress test to risk stratify given troponin of 1 earlier this year. If coronaries were suggestive of ischemic heart disease, may need repeat heart cath prior to surgery. Obviously all of this will need to be taken in context of evaluation of possible infective process and necessary timing of procedures. Dr. Angelena Form has seen and examined the patient and agrees with the above. Hospital team will follow as inpatient. 2. Chronic combined CHF/cardiomyopathy - long-term would recommend weaning off amlodipine in lieu of titrating HF therapy, to include changing Lopressor to either Toprol or carvedilol, with possible addition of Imdur/hydralazine. However, given above concern for infection I'm not yet sure the trajectory of his BP, thus will hold off on any changes today pending further eval of his fever. 1. Arrhythmia - need to obtain records ASAP to review from [place] to determine what rhythm he exactly had. We will arrange coumadin f/u in our office. 2. Essential HTN - will hold off on making BP med changes in case he develops sepsis. This can be addressed at a later date. BP controlled today.  Disposition: F/u with our clinic in approx 10-14 days to regroup; will also arrange INR f/u in coumadin clinic in 1 week (all of this is tentative based on hospital trajectory).  Medication Adjustments/Labs and Tests Ordered: Current medicines are reviewed at length with the patient today.  Concerns regarding medicines are outlined above. Medication changes, Labs and Tests ordered today are summarized above and listed in the Patient Instructions accessible in Encounters.   Signed, Charlie Pitter, PA-C    12/05/2017 10:02 AM    [facility] [place], [place], [place]  [address] Phone: [phone]; Fax: [phone]   I have personally seen and examined this patient.with Melina Copa, PA-C. I agree with the assessment and plan as outlined above. He is presenting to our office today with fever and malaise. He has HIV and a foot ulcer. He will need admission. He will be sent to the ED. Cardiology can follow. He will likely need an ischemic workup prior to discharge. Will get records from [place]. Will decide on stress testing vs cath.   Harrell Gave  Lurae Hornbrook 12/05/2017 11:17 AM

## 2017-12-05 NOTE — Patient Instructions (Addendum)
Go over to [facility] Emergency Department   Your physician recommends that you schedule a follow-up appointment in: SEE APPOINTMENTS SCHEDULED

## 2017-12-05 NOTE — Consult Note (Signed)
[place] nurse consulted while in ED for foot ulceration with foul smelling odor.  Will follow after patient has work up in ED. Marica Otter MSN, RN,CWOCN, CNS, CWON-AP

## 2017-12-05 NOTE — Consult Note (Signed)
ORTHOPAEDIC CONSULTATION  REQUESTING PHYSICIAN: Tanna Furry, MD  Chief Complaint: Abscess ulceration right foot  HPI: Thomas [name] is a 51 y.o. male who presents with purulent draining abscess from the right calcaneus.  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  Patient states he went to his cardiologist today had increasing drainage from his foot and presented to the emergency room for treatment.  Patient undergoes dialysis Tuesday Thursday and Saturday he had dialysis yesterday.  Past Medical History:  Diagnosis Date  . Anemia   . Chronic combined systolic and diastolic CHF (congestive heart failure) ([place])   . Diabetes ([place])   . ESRD (end stage renal disease) [facility])    [facility] [address] T, Th, Sat  . HIV disease ([place])   . Hyperparathyroidism, secondary ([place])   . Hypertension   . Hypertensive heart disease with end stage renal disease on dialysis ([place]) 03/13/2017  . SVT (supraventricular tachycardia) ([place])    ? afib or atrial flutter s/p TEE/DCCV with subsequent ablation due to reoccurrence in [place]  . Type 2 diabetes mellitus ([place])    Past Surgical History:  Procedure Laterality Date  . AMPUTATION Left    foot  . APPLICATION OF WOUND VAC Right 09/04/2017   Procedure: APPLICATION OF WOUND VAC;  Surgeon: [name], DPM;  Location: [place];  Service: Podiatry;  Laterality: Right;  . COLONOSCOPY WITH PROPOFOL N/A 07/30/2017   Procedure: COLONOSCOPY WITH PROPOFOL;  Surgeon: [name], MD;  Location: [place];  Service: Endoscopy;  Laterality: N/A;  . ESOPHAGOGASTRODUODENOSCOPY (EGD) WITH PROPOFOL N/A 07/28/2017   Procedure: ESOPHAGOGASTRODUODENOSCOPY (EGD) WITH PROPOFOL;  Surgeon: [name], MD;  Location: [place];  Service: Endoscopy;  Laterality: N/A;  . FLEXIBLE SIGMOIDOSCOPY N/A  07/27/2017   Procedure: FLEXIBLE SIGMOIDOSCOPY;  Surgeon: [name], MD;  Location: [facility] ENDOSCOPY;  Service: Endoscopy;  Laterality: N/A;  . GRAFT APPLICATION Right 61/95/0932   Procedure: SKIN GRAFT APPLICATION RIGHT FOOT;  Surgeon: [name], DPM;  Location: [place];  Service: Podiatry;  Laterality: Right;  . I&D EXTREMITY Right 09/04/2017   Procedure: IRRIGATION AND DEBRIDEMENT EXTREMITY;  Surgeon: [name], DPM;  Location: [place];  Service: Podiatry;  Laterality: Right;  . I&D EXTREMITY Right 11/12/2017   Procedure: IRRIGATION AND DEBRIDEMENT ULCER RIGHT FOOT;  Surgeon: [name], DPM;  Location: [place];  Service: Podiatry;  Laterality: Right;  . RIGHT AV FISTULA PLACEMENT  10/19/2012  . WOUND DEBRIDEMENT N/A 09/24/2017   Procedure: DEBRIDEMENT WOUND;  Surgeon: [name], DPM;  Location: [place];  Service: Podiatry;  Laterality: N/A;   Social History   Socioeconomic History  . Marital status: Married    Spouse name: Thomas [name]  . Number of children: 3  . Years of education: college  . Highest education level: None  Social Needs  . Financial resource strain: None  . Food insecurity - worry: None  . Food insecurity - inability: None  . Transportation needs - medical: None  . Transportation needs - non-medical: None  Occupational History  . Occupation: disabled  Tobacco Use  . Smoking status: Never Smoker  . Smokeless tobacco: Never Used  Substance and Sexual Activity  . Alcohol use: No  . Drug use: No  . Sexual activity: None  Other Topics Concern  . None  Social History Narrative  .  None   Family History  Problem Relation Age of Onset  . Heart disease Mother   . Diabetes Mother   . Heart disease Father   . Cancer Neg Hx    - negative except otherwise stated in the family history section No Known Allergies Prior to Admission medications   Medication Sig Start Date End Date Taking? Authorizing Provider  amLODipine (NORVASC) 5 MG tablet Take 1  tablet (5 mg total) by mouth daily. Patient taking differently: Take 5 mg by mouth every evening.  07/31/17  Yes Velvet Bathe, MD  cinacalcet (SENSIPAR) 60 MG tablet Take 60 mg by mouth daily.   Yes [provider]  clindamycin (CLEOCIN) 150 MG capsule Take 1 capsule (150 mg total) by mouth 3 (three) times daily. 12/03/17  Yes [name], DPM  doxercalciferol (HECTOROL) 4 MCG/2ML injection Inject 2.5 mLs (5 mcg total) into the vein Every Tuesday,Thursday,and Saturday with dialysis. 05/10/17  Yes Hosie Poisson, MD  ferric gluconate 125 mg in sodium chloride 0.9 % 100 mL Inject 125 mg into the vein Every Tuesday,Thursday,and Saturday with dialysis. 05/10/17  Yes Hosie Poisson, MD  fosamprenavir (LEXIVA) 700 MG tablet Take 700 mg by mouth 2 (two) times daily.    Yes [provider]  insulin aspart (NOVOLOG) 100 UNIT/ML injection CBG 70 - 120: 0 units CBG 121 - 150: 3 units CBG 151 - 200: 4 units CBG 201 - 250: 7 units CBG 251 - 300: 11 units CBG 301 - 350: 15 units CBG 351 - 400: 20 units Patient taking differently: Inject 0-20 Units into the skin 3 (three) times daily with meals. CBG 70 - 120: 0 units CBG 121 - 150: 3 units CBG 151 - 200: 4 units CBG 201 - 250: 7 units CBG 251 - 300: 11 units CBG 301 - 350: 15 units CBG 351 - 400: 20 units 05/08/17  Yes Hosie Poisson, MD  insulin glargine (LANTUS) 100 UNIT/ML injection Inject 0.3 mLs (30 Units total) into the skin daily at 10 pm. 05/08/17  Yes Hosie Poisson, MD  lisinopril (PRINIVIL,ZESTRIL) 40 MG tablet Take 40 mg by mouth daily.   Yes [provider]  metoprolol tartrate (LOPRESSOR) 25 MG tablet Take 1 tablet (25 mg total) by mouth 2 (two) times daily. 07/30/17  Yes Velvet Bathe, MD  Multiple Vitamin (TAB-A-VITE PO) Take 1 tablet by mouth daily.   Yes [provider]  raltegravir (ISENTRESS) 400 MG tablet Take 400 mg by mouth 2 (two) times daily.   Yes [provider]  ritonavir (NORVIR) 100 MG  capsule Take 100 mg by mouth daily with breakfast.   Yes [provider]  sevelamer carbonate (RENVELA) 800 MG tablet Take 2,400 mg by mouth 3 (three) times daily with meals.    Yes [provider]  warfarin (COUMADIN) 10 MG tablet Take 10 mg by mouth See admin instructions. Take 10 mg by mouth daily Sunday, Monday, Wednesday and Friday   Yes [provider]  warfarin (COUMADIN) 7.5 MG tablet Take 7.5 mg by mouth See admin instructions. Take 7.5 mg by mouth daily on Tuesday, Thursday ans Saturday   Yes [provider]  Soft Lens Products (SALINE) 0.9 % SOLN Apply to gauze as part of saline wet to dry dressing 10/03/17   [name], DPM   Dg Chest 2 View  Result Date: 12/05/2017 CLINICAL DATA:  CHF, shortness of Breath EXAM: CHEST  2 VIEW COMPARISON:  05/05/2017 FINDINGS: Cardiomegaly. Improving vascular congestion  and pulmonary edema since prior study. Mild vascular congestion. Bibasilar atelectasis. No effusions or acute bony abnormality. IMPRESSION: Cardiomegaly, vascular congestion, improved since prior study. Minimal bibasilar atelectasis. Electronically Signed   By: Rolm Baptise M.D.   On: 12/05/2017 10:24   Dg Foot 2 Views Right  Result Date: 12/05/2017 CLINICAL DATA:  Diabetic foot wound. EXAM: RIGHT FOOT - 2 VIEW COMPARISON:  Radiographs of December 03, 2017. FINDINGS: Extensive vascular calcifications are noted. Probable osteotomy defect seen involving the proximal base of the first proximal phalanx. Mild posterior calcaneal spurring is noted. Large ulceration or wound is seen overlying the plantar aspect of posterior calcaneus. Underlying lytic destruction is not seen to suggest osteomyelitis. No acute fracture or dislocation is noted. IMPRESSION: Large ulceration or soft tissue wound is seen overlying plantar aspect of posterior calcaneus. There is no definite evidence of underlying osteomyelitis. Electronically Signed   By: Marijo Conception, M.D.   On:  12/05/2017 12:14   - pertinent xrays, CT, MRI studies were reviewed and independently interpreted  Positive ROS: All other systems have been reviewed and were otherwise negative with the exception of those mentioned in the HPI and as above.  Physical Exam: General: Alert, no acute distress Psychiatric: Patient is competent for consent with normal mood and affect Lymphatic: No axillary or cervical lymphadenopathy Cardiovascular: No pedal edema Respiratory: No cyanosis, no use of accessory musculature GI: No organomegaly, abdomen is soft and non-tender  Skin: Examination patient has hyper granulation ulcer over the right heel pad.  There is hyper granulation tissue the ulcerative area is 6 cm in diameter.  There is an ulcer which probes to the plantar aspect in the calcaneus is palpable.  There is a purulent draining ulcer laterally over the heel pad which is 2 cm in diameter and this also probes to bone.   Neurologic: Patient does not have protective sensation bilateral lower extremities.   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  Patient has ischemic changes to the toes in the right foot he does not have a palpable dorsalis pedis or posterior tibial pulse.  The ulcerative area is probed to bone with foul-smelling purulent drainage from the lateral aspect of the calcaneal wound.  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  Plan: Plan: Patient's only option is to proceed with a transtibial amputation.  Risks and benefits were discussed including persistent infection nonhealing of the incision and need for additional surgery.  Patient states he understands wished to proceed at this time.  He states he would  like to change to [facility] for his orthotist and prosthetist.  We will plan for surgery tomorrow morning Saturday.  Patient will proceed with dialysis after surgery.  I will cancel the MRI scan, patient's only option is a transtibial amputation for limb and life salvage.  Thank you for the consult and the opportunity to see Mr. Cree Kunert, [place] [phone] 3:31 PM

## 2017-12-05 NOTE — H&P (View-Only) (Signed)
ORTHOPAEDIC CONSULTATION  REQUESTING PHYSICIAN: Tanna Furry, MD  Chief Complaint: Abscess ulceration right foot  HPI: Thomas [name] is a 51 y.o. male who presents with purulent draining abscess from the right calcaneus.  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  Patient states he went to his cardiologist today had increasing drainage from his foot and presented to the emergency room for treatment.  Patient undergoes dialysis Tuesday Thursday and Saturday he had dialysis yesterday.  Past Medical History:  Diagnosis Date  . Anemia   . Chronic combined systolic and diastolic CHF (congestive heart failure) ([place])   . Diabetes ([place])   . ESRD (end stage renal disease) [facility])    [facility] [address] T, Th, Sat  . HIV disease ([place])   . Hyperparathyroidism, secondary ([place])   . Hypertension   . Hypertensive heart disease with end stage renal disease on dialysis ([place]) 03/13/2017  . SVT (supraventricular tachycardia) ([place])    ? afib or atrial flutter s/p TEE/DCCV with subsequent ablation due to reoccurrence in [place]  . Type 2 diabetes mellitus ([place])    Past Surgical History:  Procedure Laterality Date  . AMPUTATION Left    foot  . APPLICATION OF WOUND VAC Right 09/04/2017   Procedure: APPLICATION OF WOUND VAC;  Surgeon: [name], DPM;  Location: [place];  Service: Podiatry;  Laterality: Right;  . COLONOSCOPY WITH PROPOFOL N/A 07/30/2017   Procedure: COLONOSCOPY WITH PROPOFOL;  Surgeon: [name], MD;  Location: [place];  Service: Endoscopy;  Laterality: N/A;  . ESOPHAGOGASTRODUODENOSCOPY (EGD) WITH PROPOFOL N/A 07/28/2017   Procedure: ESOPHAGOGASTRODUODENOSCOPY (EGD) WITH PROPOFOL;  Surgeon: [name], MD;  Location: [place];  Service: Endoscopy;  Laterality: N/A;  . FLEXIBLE SIGMOIDOSCOPY N/A  07/27/2017   Procedure: FLEXIBLE SIGMOIDOSCOPY;  Surgeon: [name], MD;  Location: [facility] ENDOSCOPY;  Service: Endoscopy;  Laterality: N/A;  . GRAFT APPLICATION Right 10/15/5101   Procedure: SKIN GRAFT APPLICATION RIGHT FOOT;  Surgeon: [name], DPM;  Location: [place];  Service: Podiatry;  Laterality: Right;  . I&D EXTREMITY Right 09/04/2017   Procedure: IRRIGATION AND DEBRIDEMENT EXTREMITY;  Surgeon: [name], DPM;  Location: [place];  Service: Podiatry;  Laterality: Right;  . I&D EXTREMITY Right 11/12/2017   Procedure: IRRIGATION AND DEBRIDEMENT ULCER RIGHT FOOT;  Surgeon: [name], DPM;  Location: [place];  Service: Podiatry;  Laterality: Right;  . RIGHT AV FISTULA PLACEMENT  10/19/2012  . WOUND DEBRIDEMENT N/A 09/24/2017   Procedure: DEBRIDEMENT WOUND;  Surgeon: [name], DPM;  Location: [place];  Service: Podiatry;  Laterality: N/A;   Social History   Socioeconomic History  . Marital status: Married    Spouse name: kim  . Number of children: 3  . Years of education: college  . Highest education level: None  Social Needs  . Financial resource strain: None  . Food insecurity - worry: None  . Food insecurity - inability: None  . Transportation needs - medical: None  . Transportation needs - non-medical: None  Occupational History  . Occupation: disabled  Tobacco Use  . Smoking status: Never Smoker  . Smokeless tobacco: Never Used  Substance and Sexual Activity  . Alcohol use: No  . Drug use: No  . Sexual activity: None  Other Topics Concern  . None  Social History Narrative  .  None   Family History  Problem Relation Age of Onset  . Heart disease Mother   . Diabetes Mother   . Heart disease Father   . Cancer Neg Hx    - negative except otherwise stated in the family history section No Known Allergies Prior to Admission medications   Medication Sig Start Date End Date Taking? Authorizing Provider  amLODipine (NORVASC) 5 MG tablet Take 1  tablet (5 mg total) by mouth daily. Patient taking differently: Take 5 mg by mouth every evening.  07/31/17  Yes Velvet Bathe, MD  cinacalcet (SENSIPAR) 60 MG tablet Take 60 mg by mouth daily.   Yes [provider]  clindamycin (CLEOCIN) 150 MG capsule Take 1 capsule (150 mg total) by mouth 3 (three) times daily. 12/03/17  Yes [name], DPM  doxercalciferol (HECTOROL) 4 MCG/2ML injection Inject 2.5 mLs (5 mcg total) into the vein Every Tuesday,Thursday,and Saturday with dialysis. 05/10/17  Yes Hosie Poisson, MD  ferric gluconate 125 mg in sodium chloride 0.9 % 100 mL Inject 125 mg into the vein Every Tuesday,Thursday,and Saturday with dialysis. 05/10/17  Yes Hosie Poisson, MD  fosamprenavir (LEXIVA) 700 MG tablet Take 700 mg by mouth 2 (two) times daily.    Yes [provider]  insulin aspart (NOVOLOG) 100 UNIT/ML injection CBG 70 - 120: 0 units CBG 121 - 150: 3 units CBG 151 - 200: 4 units CBG 201 - 250: 7 units CBG 251 - 300: 11 units CBG 301 - 350: 15 units CBG 351 - 400: 20 units Patient taking differently: Inject 0-20 Units into the skin 3 (three) times daily with meals. CBG 70 - 120: 0 units CBG 121 - 150: 3 units CBG 151 - 200: 4 units CBG 201 - 250: 7 units CBG 251 - 300: 11 units CBG 301 - 350: 15 units CBG 351 - 400: 20 units 05/08/17  Yes Hosie Poisson, MD  insulin glargine (LANTUS) 100 UNIT/ML injection Inject 0.3 mLs (30 Units total) into the skin daily at 10 pm. 05/08/17  Yes Hosie Poisson, MD  lisinopril (PRINIVIL,ZESTRIL) 40 MG tablet Take 40 mg by mouth daily.   Yes [provider]  metoprolol tartrate (LOPRESSOR) 25 MG tablet Take 1 tablet (25 mg total) by mouth 2 (two) times daily. 07/30/17  Yes Velvet Bathe, MD  Multiple Vitamin (TAB-A-VITE PO) Take 1 tablet by mouth daily.   Yes [provider]  raltegravir (ISENTRESS) 400 MG tablet Take 400 mg by mouth 2 (two) times daily.   Yes [provider]  ritonavir (NORVIR) 100 MG  capsule Take 100 mg by mouth daily with breakfast.   Yes [provider]  sevelamer carbonate (RENVELA) 800 MG tablet Take 2,400 mg by mouth 3 (three) times daily with meals.    Yes [provider]  warfarin (COUMADIN) 10 MG tablet Take 10 mg by mouth See admin instructions. Take 10 mg by mouth daily Sunday, Monday, Wednesday and Friday   Yes [provider]  warfarin (COUMADIN) 7.5 MG tablet Take 7.5 mg by mouth See admin instructions. Take 7.5 mg by mouth daily on Tuesday, Thursday ans Saturday   Yes [provider]  Soft Lens Products (SALINE) 0.9 % SOLN Apply to gauze as part of saline wet to dry dressing 10/03/17   [name], DPM   Dg Chest 2 View  Result Date: 12/05/2017 CLINICAL DATA:  CHF, shortness of Breath EXAM: CHEST  2 VIEW COMPARISON:  05/05/2017 FINDINGS: Cardiomegaly. Improving vascular congestion  and pulmonary edema since prior study. Mild vascular congestion. Bibasilar atelectasis. No effusions or acute bony abnormality. IMPRESSION: Cardiomegaly, vascular congestion, improved since prior study. Minimal bibasilar atelectasis. Electronically Signed   By: Rolm Baptise M.D.   On: 12/05/2017 10:24   Dg Foot 2 Views Right  Result Date: 12/05/2017 CLINICAL DATA:  Diabetic foot wound. EXAM: RIGHT FOOT - 2 VIEW COMPARISON:  Radiographs of December 03, 2017. FINDINGS: Extensive vascular calcifications are noted. Probable osteotomy defect seen involving the proximal base of the first proximal phalanx. Mild posterior calcaneal spurring is noted. Large ulceration or wound is seen overlying the plantar aspect of posterior calcaneus. Underlying lytic destruction is not seen to suggest osteomyelitis. No acute fracture or dislocation is noted. IMPRESSION: Large ulceration or soft tissue wound is seen overlying plantar aspect of posterior calcaneus. There is no definite evidence of underlying osteomyelitis. Electronically Signed   By: Marijo Conception, M.D.   On:  12/05/2017 12:14   - pertinent xrays, CT, MRI studies were reviewed and independently interpreted  Positive ROS: All other systems have been reviewed and were otherwise negative with the exception of those mentioned in the HPI and as above.  Physical Exam: General: Alert, no acute distress Psychiatric: Patient is competent for consent with normal mood and affect Lymphatic: No axillary or cervical lymphadenopathy Cardiovascular: No pedal edema Respiratory: No cyanosis, no use of accessory musculature GI: No organomegaly, abdomen is soft and non-tender  Skin: Examination patient has hyper granulation ulcer over the right heel pad.  There is hyper granulation tissue the ulcerative area is 6 cm in diameter.  There is an ulcer which probes to the plantar aspect in the calcaneus is palpable.  There is a purulent draining ulcer laterally over the heel pad which is 2 cm in diameter and this also probes to bone.   Neurologic: Patient does not have protective sensation bilateral lower extremities.   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  Patient has ischemic changes to the toes in the right foot he does not have a palpable dorsalis pedis or posterior tibial pulse.  The ulcerative area is probed to bone with foul-smelling purulent drainage from the lateral aspect of the calcaneal wound.  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  Plan: Plan: Patient's only option is to proceed with a transtibial amputation.  Risks and benefits were discussed including persistent infection nonhealing of the incision and need for additional surgery.  Patient states he understands wished to proceed at this time.  He states he would  like to change to [facility] for his orthotist and prosthetist.  We will plan for surgery tomorrow morning Saturday.  Patient will proceed with dialysis after surgery.  I will cancel the MRI scan, patient's only option is a transtibial amputation for limb and life salvage.  Thank you for the consult and the opportunity to see Thomas [name], [place] [phone] 3:31 PM

## 2017-12-05 NOTE — Consult Note (Signed)
Reason for Consult:Right foot ulcers Referring Physician: Rylyn [name] is an 51 y.o. male with DM, HIV, ESRD on HD. HPI: Thomas [name] comes to the ED with a 2-3 months hx/o right foot ulcers. The heel ulcer has been the main one but he had another crop up on his ankle a few days ago. He's been seeing podiatry but doesn't feel like they've been doing much. He denies significant pain. He's been treating the wounds at home with wet-to-dry dressings and iodine. His sugars run in the 300's at home (he classified that as pretty good). 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  Past Medical History:  Diagnosis Date  . Anemia   . Chronic combined systolic and diastolic CHF (congestive heart failure) ([place])   . Diabetes ([place])   . ESRD (end stage renal disease) [facility])    [place] [id number] T, Th, Sat  . HIV disease ([place])   . Hyperparathyroidism, secondary ([place])   . Hypertension   . Hypertensive heart disease with end stage renal disease on dialysis ([place]) 03/13/2017  . SVT (supraventricular tachycardia) ([place])    ? afib or atrial flutter s/p TEE/DCCV with subsequent ablation due to reoccurrence in [place]  . Type 2 diabetes mellitus ([place])     Past Surgical History:  Procedure Laterality Date  . AMPUTATION Left    foot  . APPLICATION OF WOUND VAC Right 09/04/2017   Procedure: APPLICATION OF WOUND VAC;  Surgeon: [name], DPM;  Location: [place];  Service: Podiatry;  Laterality: Right;  . COLONOSCOPY WITH PROPOFOL N/A 07/30/2017   Procedure: COLONOSCOPY WITH PROPOFOL;  Surgeon: [name], MD;  Location: [place];  Service: Endoscopy;  Laterality: N/A;  . ESOPHAGOGASTRODUODENOSCOPY (EGD) WITH PROPOFOL N/A 07/28/2017   Procedure: ESOPHAGOGASTRODUODENOSCOPY (EGD) WITH PROPOFOL;  Surgeon: [name], MD;  Location: [place];  Service: Endoscopy;  Laterality: N/A;  . FLEXIBLE SIGMOIDOSCOPY N/A 07/27/2017   Procedure: FLEXIBLE SIGMOIDOSCOPY;  Surgeon: [name],  MD;  Location: [facility] ENDOSCOPY;  Service: Endoscopy;  Laterality: N/A;  . GRAFT APPLICATION Right 95/28/4132   Procedure: SKIN GRAFT APPLICATION RIGHT FOOT;  Surgeon: [name], DPM;  Location: [place];  Service: Podiatry;  Laterality: Right;  . I&D EXTREMITY Right 09/04/2017   Procedure: IRRIGATION AND DEBRIDEMENT EXTREMITY;  Surgeon: [name], DPM;  Location: [place];  Service: Podiatry;  Laterality: Right;  . I&D EXTREMITY Right 11/12/2017   Procedure: IRRIGATION AND DEBRIDEMENT ULCER RIGHT FOOT;  Surgeon: [name], DPM;  Location: [place];  Service: Podiatry;  Laterality: Right;  . RIGHT AV FISTULA PLACEMENT  10/19/2012  . WOUND DEBRIDEMENT N/A 09/24/2017   Procedure: DEBRIDEMENT WOUND;  Surgeon: [name], DPM;  Location: [place];  Service: Podiatry;  Laterality: N/A;    Family History  Problem Relation Age of Onset  . Heart disease Mother   . Diabetes Mother   . Heart disease Father   . Cancer Neg Hx     Social History:  reports that  has never smoked. he has never used smokeless tobacco. He reports that he does not drink alcohol or use drugs.  Allergies: No Known Allergies  Medications: I have reviewed the patient's current medications.  Results for orders placed or performed during the hospital encounter of 12/05/17 (from the past 48 hour(s))  Comprehensive metabolic panel     Status: Abnormal   Collection Time: 12/05/17  9:53 AM  Result Value Ref Range  Sodium 132 (L) 135 - 145 mmol/L   Potassium 3.9 3.5 - 5.1 mmol/L   Chloride 94 (L) 101 - 111 mmol/L   CO2 24 22 - 32 mmol/L   Glucose, Bld 315 (H) 65 - 99 mg/dL   BUN 30 (H) 6 - 20 mg/dL   Creatinine, Ser 6.71 (H) 0.61 - 1.24 mg/dL   Calcium 8.6 (L) 8.9 - 10.3 mg/dL   Total Protein 7.7 6.5 - 8.1 g/dL   Albumin 2.6 (L) 3.5 - 5.0 g/dL   AST 15 15 - 41 U/L   ALT 14 (L) 17 - 63 U/L   Alkaline Phosphatase 102 38 - 126 U/L   Total Bilirubin 0.9 0.3 - 1.2 mg/dL   GFR calc non Af Amer 9 (L) >60 mL/min    GFR calc Af Amer 10 (L) >60 mL/min    Comment: (NOTE) The eGFR has been calculated using the CKD EPI equation. This calculation has not been validated in all clinical situations. eGFR's persistently <60 mL/min signify possible Chronic Kidney Disease.    Anion gap 14 5 - 15  CBC with Differential     Status: Abnormal   Collection Time: 12/05/17  9:53 AM  Result Value Ref Range   WBC 14.0 (H) 4.0 - 10.5 K/uL   RBC 2.83 (L) 4.22 - 5.81 MIL/uL   Hemoglobin 8.4 (L) 13.0 - 17.0 g/dL   HCT 26.6 (L) 39.0 - 52.0 %   MCV 94.0 78.0 - 100.0 fL   MCH 29.7 26.0 - 34.0 pg   MCHC 31.6 30.0 - 36.0 g/dL   RDW 16.6 (H) 11.5 - 15.5 %   Platelets 314 150 - 400 K/uL   Neutrophils Relative % 79 %   Neutro Abs 11.1 (H) 1.7 - 7.7 K/uL   Lymphocytes Relative 8 %   Lymphs Abs 1.2 0.7 - 4.0 K/uL   Monocytes Relative 11 %   Monocytes Absolute 1.5 (H) 0.1 - 1.0 K/uL   Eosinophils Relative 2 %   Eosinophils Absolute 0.2 0.0 - 0.7 K/uL   Basophils Relative 0 %   Basophils Absolute 0.0 0.0 - 0.1 K/uL  I-stat troponin, ED     Status: Abnormal   Collection Time: 12/05/17 10:21 AM  Result Value Ref Range   Troponin i, poc 0.12 (HH) 0.00 - 0.08 ng/mL   Comment NOTIFIED PHYSICIAN    Comment 3            Comment: Due to the release kinetics of cTnI, a negative result within the first hours of the onset of symptoms does not rule out myocardial infarction with certainty. If myocardial infarction is still suspected, repeat the test at appropriate intervals.   I-Stat CG4 Lactic Acid, ED     Status: None   Collection Time: 12/05/17 11:47 AM  Result Value Ref Range   Lactic Acid, Venous 1.21 0.5 - 1.9 mmol/L  I-Stat CG4 Lactic Acid, ED     Status: None   Collection Time: 12/05/17  1:22 PM  Result Value Ref Range   Lactic Acid, Venous 1.33 0.5 - 1.9 mmol/L    Dg Chest 2 View  Result Date: 12/05/2017 CLINICAL DATA:  CHF, shortness of Breath EXAM: CHEST  2 VIEW COMPARISON:  05/05/2017 FINDINGS: Cardiomegaly.  Improving vascular congestion and pulmonary edema since prior study. Mild vascular congestion. Bibasilar atelectasis. No effusions or acute bony abnormality. IMPRESSION: Cardiomegaly, vascular congestion, improved since prior study. Minimal bibasilar atelectasis. Electronically Signed   By: Rolm Baptise M.D.  On: 12/05/2017 10:24   Dg Foot 2 Views Right  Result Date: 12/05/2017 CLINICAL DATA:  Diabetic foot wound. EXAM: RIGHT FOOT - 2 VIEW COMPARISON:  Radiographs of December 03, 2017. FINDINGS: Extensive vascular calcifications are noted. Probable osteotomy defect seen involving the proximal base of the first proximal phalanx. Mild posterior calcaneal spurring is noted. Large ulceration or wound is seen overlying the plantar aspect of posterior calcaneus. Underlying lytic destruction is not seen to suggest osteomyelitis. No acute fracture or dislocation is noted. IMPRESSION: Large ulceration or soft tissue wound is seen overlying plantar aspect of posterior calcaneus. There is no definite evidence of underlying osteomyelitis. Electronically Signed   By: Marijo Conception, M.D.   On: 12/05/2017 12:14    Review of Systems  Constitutional: Negative for chills, fever and weight loss.  HENT: Negative for ear discharge, ear pain, hearing loss and tinnitus.   Eyes: Negative for blurred vision, double vision, photophobia and pain.  Respiratory: Negative for cough, sputum production and shortness of breath.   Cardiovascular: Negative for chest pain.  Gastrointestinal: Negative for abdominal pain, nausea and vomiting.  Genitourinary: Negative for dysuria, flank pain, frequency and urgency.  Musculoskeletal: Negative for back pain, falls, joint pain, myalgias and neck pain.  Neurological: Negative for dizziness, tingling, sensory change, focal weakness, loss of consciousness and headaches.  Endo/Heme/Allergies: Does not bruise/bleed easily.  Psychiatric/Behavioral: Negative for depression, memory loss and  substance abuse. The patient is not nervous/anxious.    Blood pressure 128/69, pulse 91, temperature 99.1 F (37.3 C), temperature source Oral, resp. rate 18, SpO2 99 %. Physical Exam  Constitutional: He appears well-developed and well-nourished. No distress.  HENT:  Head: Normocephalic.  Eyes: Conjunctivae are normal. Right eye exhibits no discharge. Left eye exhibits no discharge. No scleral icterus.  Neck: Normal range of motion.  Cardiovascular: Normal rate and regular rhythm.  Respiratory: Effort normal. No respiratory distress.  Musculoskeletal:  RLE No traumatic wounds, ecchymosis, or rash  Generalized TTP distal lower leg and below             Ulceration over lateral mal w/bloody purulent discharge and odor, large heel pad ulceration  No knee or ankle effusion  Knee stable to varus/ valgus and anterior/posterior stress  Sens SPN intact, TN paresthetic, DPN absent  Motor EHL, ext, flex, evers 5/5  DP 0, PT 1+, No significant edema  Neurological: He is alert.  Skin: Skin is warm and dry. He is not diaphoretic.  Psychiatric: He has a normal mood and affect. His behavior is normal.    Assessment/Plan: Right foot ulcerations -- Dr. Sharol Given to evaluate. Despite no evidence of osteo and normal ABI in September I am not optimistic for his recovery without amputation. Will check MRI. He may have a diet for now. Will have [place] consult. IM to admit.    Lisette Abu, PA-C Orthopedic Surgery [phone] 12/05/2017, 2:32 PM

## 2017-12-05 NOTE — ED Notes (Signed)
Notified pharmacy to adjust time and for missing dose.

## 2017-12-05 NOTE — Consult Note (Signed)
WOC consulted for foot wounds, reviewed images.  Patient with pending MRI to R/O osteomyelitis.  Would use conservative wound care for now until surgical evaluation.  Wound care ordered. Will follow up after surgery to determine if any further needs.   [place], [place], [place]

## 2017-12-05 NOTE — ED Triage Notes (Signed)
Per Pt, Pt is coming from cardiology where he was sent over for a wound evaluation, fevers, and some SOB that started last night. Reports not sleeping. Denies any chest pain.

## 2017-12-05 NOTE — ED Provider Notes (Addendum)
[facility] EMERGENCY DEPARTMENT Provider Note   CSN: [id number] Arrival date & time: 12/05/17  0945     History   Chief Complaint Chief Complaint  Patient presents with  . Wound Infection    HPI Thomas [name] is a 51 y.o. male. Chief complaint is fever, and worsening foot wound.  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  He has had evaluations over several months with podiatry. They present here today after progressive wound breakdown, fevers last night and today. Seen at cardiology office today. Was felt he needed admission for fever, and further evaluation of his wound. Apparently was there for preoperative clearance as well with his extensive cardiac history including CHF, PAF.  Patient and his wife request orthopedic evaluation rather than podiatry evaluation as expressed frustration over his progression despite podiatry care.  Past Medical History:  Diagnosis Date  . Anemia   . Chronic combined systolic and diastolic CHF (congestive heart failure) ([place])   . Diabetes ([place])   . ESRD (end stage renal disease) [facility])    [place] [address] T, Th, Sat  . HIV disease ([place])   . Hyperparathyroidism, secondary ([place])   . Hypertension   . Hypertensive heart disease with end stage renal disease on dialysis ([place]) 03/13/2017  . SVT (supraventricular tachycardia) ([place])    ? afib or atrial flutter s/p TEE/DCCV with subsequent ablation due to reoccurrence in [place]  . Type 2 diabetes mellitus [facility])     Patient Active Problem List   Diagnosis Date Noted  . Non-ischemic cardiomyopathy ([place])   . Hypokalemia 09/04/2017  . 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 Essential hypertension 09/03/2017  . GERD (gastroesophageal reflux disease) 09/03/2017  . Diabetic foot ulcer ([place])  09/03/2017  . GIB (gastrointestinal bleeding) 07/26/2017  . Anemia due to end stage renal disease ([place]) 07/26/2017  . PAF (paroxysmal atrial fibrillation) ([place]) 07/26/2017  . Symptomatic anemia 07/26/2017  . Fluid overload 05/05/2017  . Elevated troponin 05/05/2017  . Pulmonary edema 05/05/2017  . ESRD (end stage renal disease) ([place]) 05/05/2017  . HIV disease ([place]) 03/25/2017  . Hypertensive heart disease with end stage renal disease on dialysis ([place]) 03/13/2017  . ESRD on dialysis ([place]) 03/13/2017  . Type 2 diabetes mellitus with complication ([place]) 32/11/2481  . SVT (supraventricular tachycardia) ([facility])     Past Surgical History:  Procedure Laterality Date  . AMPUTATION Left    foot  . APPLICATION OF WOUND VAC Right 09/04/2017   Procedure: APPLICATION OF WOUND VAC;  Surgeon: [name], DPM;  Location: [place];  Service: Podiatry;  Laterality: Right;  . COLONOSCOPY WITH PROPOFOL N/A 07/30/2017   Procedure: COLONOSCOPY WITH PROPOFOL;  Surgeon: [name], MD;  Location: [place];  Service: Endoscopy;  Laterality: N/A;  . ESOPHAGOGASTRODUODENOSCOPY (EGD) WITH PROPOFOL N/A 07/28/2017   Procedure: ESOPHAGOGASTRODUODENOSCOPY (EGD) WITH PROPOFOL;  Surgeon: [name], MD;  Location: [place];  Service: Endoscopy;  Laterality: N/A;  . FLEXIBLE SIGMOIDOSCOPY N/A 07/27/2017   Procedure: FLEXIBLE SIGMOIDOSCOPY;  Surgeon: [name], MD;  Location: [facility] ENDOSCOPY;  Service: Endoscopy;  Laterality: N/A;  . GRAFT APPLICATION Right 50/02/7047   Procedure: SKIN GRAFT APPLICATION RIGHT FOOT;  Surgeon: [name], DPM;  Location: [place];  Service: Podiatry;  Laterality: Right;  . I&D EXTREMITY Right 09/04/2017  Procedure: IRRIGATION AND DEBRIDEMENT EXTREMITY;  Surgeon: [name], DPM;  Location: [place];  Service: Podiatry;  Laterality: Right;  . I&D EXTREMITY Right 11/12/2017   Procedure: IRRIGATION AND DEBRIDEMENT ULCER RIGHT FOOT;  Surgeon: [name], DPM;   Location: [place];  Service: Podiatry;  Laterality: Right;  . RIGHT AV FISTULA PLACEMENT  10/19/2012  . WOUND DEBRIDEMENT N/A 09/24/2017   Procedure: DEBRIDEMENT WOUND;  Surgeon: [name], DPM;  Location: [place];  Service: Podiatry;  Laterality: N/A;       Home Medications    Prior to Admission medications   Medication Sig Start Date End Date Taking? Authorizing Provider  amLODipine (NORVASC) 5 MG tablet Take 1 tablet (5 mg total) by mouth daily. Patient taking differently: Take 5 mg by mouth every evening.  07/31/17  Yes Velvet Bathe, MD  cinacalcet (SENSIPAR) 60 MG tablet Take 60 mg by mouth daily.   Yes [provider]  clindamycin (CLEOCIN) 150 MG capsule Take 1 capsule (150 mg total) by mouth 3 (three) times daily. 12/03/17  Yes [name], DPM  doxercalciferol (HECTOROL) 4 MCG/2ML injection Inject 2.5 mLs (5 mcg total) into the vein Every Tuesday,Thursday,and Saturday with dialysis. 05/10/17  Yes Hosie Poisson, MD  ferric gluconate 125 mg in sodium chloride 0.9 % 100 mL Inject 125 mg into the vein Every Tuesday,Thursday,and Saturday with dialysis. 05/10/17  Yes Hosie Poisson, MD  fosamprenavir (LEXIVA) 700 MG tablet Take 700 mg by mouth 2 (two) times daily.    Yes [provider]  insulin aspart (NOVOLOG) 100 UNIT/ML injection CBG 70 - 120: 0 units CBG 121 - 150: 3 units CBG 151 - 200: 4 units CBG 201 - 250: 7 units CBG 251 - 300: 11 units CBG 301 - 350: 15 units CBG 351 - 400: 20 units Patient taking differently: Inject 0-20 Units into the skin 3 (three) times daily with meals. CBG 70 - 120: 0 units CBG 121 - 150: 3 units CBG 151 - 200: 4 units CBG 201 - 250: 7 units CBG 251 - 300: 11 units CBG 301 - 350: 15 units CBG 351 - 400: 20 units 05/08/17  Yes Hosie Poisson, MD  insulin glargine (LANTUS) 100 UNIT/ML injection Inject 0.3 mLs (30 Units total) into the skin daily at 10 pm. 05/08/17  Yes Hosie Poisson, MD  lisinopril (PRINIVIL,ZESTRIL) 40 MG tablet  Take 40 mg by mouth daily.   Yes [provider]  metoprolol tartrate (LOPRESSOR) 25 MG tablet Take 1 tablet (25 mg total) by mouth 2 (two) times daily. 07/30/17  Yes Velvet Bathe, MD  Multiple Vitamin (TAB-A-VITE PO) Take 1 tablet by mouth daily.   Yes [provider]  raltegravir (ISENTRESS) 400 MG tablet Take 400 mg by mouth 2 (two) times daily.   Yes [provider]  ritonavir (NORVIR) 100 MG capsule Take 100 mg by mouth daily with breakfast.   Yes [provider]  sevelamer carbonate (RENVELA) 800 MG tablet Take 2,400 mg by mouth 3 (three) times daily with meals.    Yes [provider]  warfarin (COUMADIN) 10 MG tablet Take 10 mg by mouth See admin instructions. Take 10 mg by mouth daily Sunday, Monday, Wednesday and Friday   Yes [provider]  warfarin (COUMADIN) 7.5 MG tablet Take 7.5 mg by mouth See admin instructions. Take 7.5 mg by mouth daily on Tuesday, Thursday ans Saturday   Yes [provider]  Soft Lens Products (SALINE) 0.9 %  SOLN Apply to gauze as part of saline wet to dry dressing 10/03/17   [name], DPM    Family History Family History  Problem Relation Age of Onset  . Heart disease Mother   . Diabetes Mother   . Heart disease Father   . Cancer Neg Hx     Social History Social History   Tobacco Use  . Smoking status: Never Smoker  . Smokeless tobacco: Never Used  Substance Use Topics  . Alcohol use: No  . Drug use: No     Allergies   Patient has no known allergies.   Review of Systems Review of Systems  Constitutional: Positive for diaphoresis, fatigue and fever. Negative for appetite change and chills.  HENT: Negative for mouth sores, sore throat and trouble swallowing.   Eyes: Negative for visual disturbance.  Respiratory: Negative for cough, chest tightness, shortness of breath and wheezing.   Cardiovascular: Negative for chest pain.  Gastrointestinal: Negative for abdominal  distention, abdominal pain, diarrhea, nausea and vomiting.  Endocrine: Negative for polydipsia, polyphagia and polyuria.  Genitourinary: Negative for dysuria, frequency and hematuria.  Musculoskeletal: Negative for gait problem.       Patient has some feeling in his feet despite neuropathy. States he has "a little" pain in his right foot.  Skin: Positive for color change and wound. Negative for pallor and rash.  Neurological: Negative for dizziness, syncope, light-headedness and headaches.  Hematological: Does not bruise/bleed easily.  Psychiatric/Behavioral: Negative for behavioral problems and confusion.     Physical Exam Updated Vital Signs BP 121/79   Pulse 89   Temp 99.1 F (37.3 C) (Oral)   Resp 18   SpO2 95%   Physical Exam  Constitutional: He is oriented to person, place, and time. He appears well-developed and well-nourished. No distress.  HENT:  Head: Normocephalic.  Eyes: Conjunctivae are normal. Pupils are equal, round, and reactive to light. No scleral icterus.  Neck: Normal range of motion. Neck supple. No thyromegaly present.  Cardiovascular: Normal rate and regular rhythm. Exam reveals no gallop and no friction rub.  No murmur heard. Pulmonary/Chest: Effort normal and breath sounds normal. No respiratory distress. He has no wheezes. He has no rales.  Abdominal: Soft. Bowel sounds are normal. He exhibits no distension. There is no tenderness. There is no rebound.  Musculoskeletal: Normal range of motion.  Left BKA. He has ulceration on his right foot laterally at the heel and at the base of the heel. Some granulation tissue. No saline or lymphangitic proximal spread.  Neurological: He is alert and oriented to person, place, and time.  Skin: Skin is warm and dry. No rash noted.  Psychiatric: He has a normal mood and affect. His behavior is normal.     ED Treatments / Results  Labs (all labs ordered are listed, but only abnormal results are displayed) Labs  Reviewed  COMPREHENSIVE METABOLIC PANEL - Abnormal; Notable for the following components:      Result Value   Sodium 132 (*)    Chloride 94 (*)    Glucose, Bld 315 (*)    BUN 30 (*)    Creatinine, Ser 6.71 (*)    Calcium 8.6 (*)    Albumin 2.6 (*)    ALT 14 (*)    GFR calc non Af Amer 9 (*)    GFR calc Af Amer 10 (*)    All other components within normal limits  CBC WITH DIFFERENTIAL/PLATELET - Abnormal; Notable for the following components:  WBC 14.0 (*)    RBC 2.83 (*)    Hemoglobin 8.4 (*)    HCT 26.6 (*)    RDW 16.6 (*)    Neutro Abs 11.1 (*)    Monocytes Absolute 1.5 (*)    All other components within normal limits  I-STAT TROPONIN, ED - Abnormal; Notable for the following components:   Troponin i, poc 0.12 (*)    All other components within normal limits  CULTURE, BLOOD (ROUTINE X 2)  CULTURE, BLOOD (ROUTINE X 2)  I-STAT CG4 LACTIC ACID, ED  I-STAT CG4 LACTIC ACID, ED    EKG  EKG Interpretation  Date/Time:  Friday December 05 2017 09:59:15 EST Ventricular Rate:  96 PR Interval:  230 QRS Duration: 98 QT Interval:  394 QTC Calculation: 497 R Axis:   11 Text Interpretation:  Sinus rhythm with 1st degree A-V block with Premature atrial complexes Prolonged QT Abnormal ECG Confirmed by Tanna Furry [phone]) on 12/05/2017 11:21:08 AM       Radiology Dg Chest 2 View  Result Date: 12/05/2017 CLINICAL DATA:  CHF, shortness of Breath EXAM: CHEST  2 VIEW COMPARISON:  05/05/2017 FINDINGS: Cardiomegaly. Improving vascular congestion and pulmonary edema since prior study. Mild vascular congestion. Bibasilar atelectasis. No effusions or acute bony abnormality. IMPRESSION: Cardiomegaly, vascular congestion, improved since prior study. Minimal bibasilar atelectasis. Electronically Signed   By: Rolm Baptise M.D.   On: 12/05/2017 10:24   Dg Foot 2 Views Right  Result Date: 12/05/2017 CLINICAL DATA:  Diabetic foot wound. EXAM: RIGHT FOOT - 2 VIEW COMPARISON:  Radiographs of  December 03, 2017. FINDINGS: Extensive vascular calcifications are noted. Probable osteotomy defect seen involving the proximal base of the first proximal phalanx. Mild posterior calcaneal spurring is noted. Large ulceration or wound is seen overlying the plantar aspect of posterior calcaneus. Underlying lytic destruction is not seen to suggest osteomyelitis. No acute fracture or dislocation is noted. IMPRESSION: Large ulceration or soft tissue wound is seen overlying plantar aspect of posterior calcaneus. There is no definite evidence of underlying osteomyelitis. Electronically Signed   By: Marijo Conception, M.D.   On: 12/05/2017 12:14    Procedures Procedures (including critical care time)  Medications Ordered in ED Medications  vancomycin (VANCOCIN) 2,500 mg in sodium chloride 0.9 % 500 mL IVPB (2,500 mg Intravenous New Bag/Given 12/05/17 1441)  vancomycin (VANCOCIN) IVPB 1000 mg/200 mL premix (not administered)  piperacillin-tazobactam (ZOSYN) IVPB 3.375 g (0 g Intravenous Stopped 12/05/17 1311)     Initial Impression / Assessment and Plan / ED Course  I have reviewed the triage vital signs and the nursing notes.  Pertinent labs & imaging results that were available during my care of the patient were reviewed by me and considered in my medical decision making (see chart for details).    Blood cultures obtained. Given IV vancomycin and Zosyn. X-ray shows NO subcutaneous gas or obvious osteo. Patient was seen today by cardiology and they have a note in his chart with plans to follow him during his hospitalization. I may [place] kidney aware the patient's admission. Also discussed the case with orthopedics. He is being seen by the orthopedic PA currently.  Final Clinical Impressions(s) / ED Diagnoses   Final diagnoses:  Wound infection    ED Discharge Orders    None       Tanna Furry, MD 12/05/17 1454    Tanna Furry, MD 12/05/17 [phone]

## 2017-12-05 NOTE — Anesthesia Preprocedure Evaluation (Addendum)
Anesthesia Evaluation  Patient identified by MRN, date of birth, ID band Patient awake    Reviewed: Allergy & Precautions, NPO status , Patient's Chart, lab work & pertinent test results, reviewed documented beta blocker date and time   Airway Mallampati: II  TM Distance: >3 FB     Dental   Pulmonary neg pulmonary ROS,    breath sounds clear to auscultation       Cardiovascular hypertension, Pt. on medications and Pt. on home beta blockers +CHF  + dysrhythmias  Rhythm:Regular Rate:Normal     Neuro/Psych    GI/Hepatic Neg liver ROS, GERD  ,  Endo/Other  diabetes, Type 2, Insulin Dependent  Renal/GU Dialysis and ESRFRenal disease     Musculoskeletal   Abdominal   Peds  Hematology  (+) anemia , HIV,   Anesthesia Other Findings   Reproductive/Obstetrics                             Lab Results  Component Value Date   WBC 11.8 (H) 12/06/2017   HGB 7.6 (L) 12/06/2017   HCT 23.8 (L) 12/06/2017   MCV 92.2 12/06/2017   PLT 309 12/06/2017   Lab Results  Component Value Date   CREATININE 8.65 (H) 12/06/2017   BUN 41 (H) 12/06/2017   NA 132 (L) 12/06/2017   K 4.1 12/06/2017   CL 92 (L) 12/06/2017   CO2 25 12/06/2017   Lab Results  Component Value Date   INR 3.56 12/06/2017   INR 3.46 12/05/2017   INR 2.57 11/12/2017    Anesthesia Physical  Anesthesia Plan  ASA: IV  Anesthesia Plan: General   Post-op Pain Management:    Induction: Intravenous  PONV Risk Score and Plan: 2 and Ondansetron, Midazolam and Treatment may vary due to age or medical condition  Airway Management Planned: LMA  Additional Equipment:   Intra-op Plan:   Post-operative Plan: Extubation in OR  Informed Consent: I have reviewed the patients History and Physical, chart, labs and discussed the procedure including the risks, benefits and alternatives for the proposed anesthesia with the patient or  authorized representative who has indicated his/her understanding and acceptance.   Dental advisory given  Plan Discussed with: CRNA  Anesthesia Plan Comments:        Anesthesia Quick Evaluation

## 2017-12-05 NOTE — ED Notes (Signed)
Pt. Returned to room from [employer], eating meal

## 2017-12-05 NOTE — ED Notes (Signed)
Pt went to MRI.

## 2017-12-05 NOTE — Consult Note (Signed)
Reason for Consult: To manage dialysis and dialysis related needs Referring Physician: Dr. Jeneen Rinks, EDP  Thomas [name] is an 51 y.o. male.  HPI: Pt is a 2M with a PMH of combined diastolic and systolic CHF, ESRD on HD, HIV, SVT vs Afib/ flutter, and R heel ulcer who is now seen in consultation at the request of Dr. Jeneen Rinks for provision of HD and management of ESRD.  Pt has a R heel ulcer that has been treated with WTD dressings for the past 2 months. He went to the cardiologist today and was noted to have fever and malaise.    He presented to the ED where his R heel ulcer was found to be worsening.  Ortho c/s.  Needs an urgent amputation which is scheduled for tomorrow AM.    He last dialyzed Thursday.  Reports an error in his EDW calculations causing him to leave below his EDW and he felt bad after HD.    [facility] TTS 4 hr 15 min F180 dialyzer 3k/2.25 Ca bath BFR 500 via R RC fistula EDW 123 kg No heparin Mircera 225 mcg q 2 weeks Hectorol 5 mcg q rx Venofer 100 mg q week  Past Medical History:  Diagnosis Date  . Anemia   . Chronic combined systolic and diastolic CHF (congestive heart failure) ([place])   . Diabetes ([place])   . ESRD (end stage renal disease) [facility])    [facility] [address] T, Th, Sat  . HIV disease ([place])   . Hyperparathyroidism, secondary ([place])   . Hypertension   . Hypertensive heart disease with end stage renal disease on dialysis ([place]) 03/13/2017  . SVT (supraventricular tachycardia) ([place])    ? afib or atrial flutter s/p TEE/DCCV with subsequent ablation due to reoccurrence in [place]  . Type 2 diabetes mellitus ([place])     Past Surgical History:  Procedure Laterality Date  . AMPUTATION Left    foot  . APPLICATION OF WOUND VAC Right 09/04/2017   Procedure: APPLICATION OF WOUND VAC;  Surgeon: [name], DPM;  Location: [place];  Service: Podiatry;  Laterality: Right;  . COLONOSCOPY WITH PROPOFOL N/A 07/30/2017   Procedure: COLONOSCOPY WITH PROPOFOL;  Surgeon: [name], MD;  Location: [place];  Service: Endoscopy;  Laterality: N/A;  . ESOPHAGOGASTRODUODENOSCOPY (EGD) WITH PROPOFOL N/A 07/28/2017   Procedure: ESOPHAGOGASTRODUODENOSCOPY (EGD) WITH PROPOFOL;  Surgeon: [name], MD;  Location: [place];  Service: Endoscopy;  Laterality: N/A;  . FLEXIBLE SIGMOIDOSCOPY N/A 07/27/2017   Procedure: FLEXIBLE SIGMOIDOSCOPY;  Surgeon: [name], MD;  Location: [facility] ENDOSCOPY;  Service: Endoscopy;  Laterality: N/A;  . GRAFT APPLICATION Right 63/87/5643   Procedure: SKIN GRAFT APPLICATION RIGHT FOOT;  Surgeon: [name], DPM;  Location: [place];  Service: Podiatry;  Laterality: Right;  . I&D EXTREMITY Right 09/04/2017   Procedure: IRRIGATION AND DEBRIDEMENT EXTREMITY;  Surgeon: [name], DPM;  Location: [place];  Service: Podiatry;  Laterality: Right;  . I&D EXTREMITY Right 11/12/2017   Procedure: IRRIGATION AND DEBRIDEMENT ULCER RIGHT FOOT;  Surgeon: [name], DPM;  Location: [place];  Service: Podiatry;  Laterality: Right;  . RIGHT AV FISTULA PLACEMENT  10/19/2012  . WOUND DEBRIDEMENT N/A 09/24/2017   Procedure: DEBRIDEMENT WOUND;  Surgeon: [name], DPM;  Location: [place];  Service: Podiatry;  Laterality: N/A;    Family History  Problem Relation Age of Onset  . Heart disease Mother   . Diabetes Mother   . Heart disease Father   .  Cancer Neg Hx     Social History:  reports that  has never smoked. he has never used smokeless tobacco. He reports that he does not drink alcohol or use drugs.  Allergies: No Known Allergies  Medications: I have reviewed current meds    Results for orders placed or performed during the hospital encounter of 12/05/17 (from the past 48 hour(s))  Comprehensive metabolic panel     Status: Abnormal   Collection Time: 12/05/17  9:53 AM  Result Value Ref Range   Sodium 132 (L) 135 - 145 mmol/L   Potassium 3.9 3.5 - 5.1 mmol/L   Chloride 94 (L) 101 - 111 mmol/L   CO2 24 22 - 32 mmol/L    Glucose, Bld 315 (H) 65 - 99 mg/dL   BUN 30 (H) 6 - 20 mg/dL   Creatinine, Ser 6.71 (H) 0.61 - 1.24 mg/dL   Calcium 8.6 (L) 8.9 - 10.3 mg/dL   Total Protein 7.7 6.5 - 8.1 g/dL   Albumin 2.6 (L) 3.5 - 5.0 g/dL   AST 15 15 - 41 U/L   ALT 14 (L) 17 - 63 U/L   Alkaline Phosphatase 102 38 - 126 U/L   Total Bilirubin 0.9 0.3 - 1.2 mg/dL   GFR calc non Af Amer 9 (L) >60 mL/min   GFR calc Af Amer 10 (L) >60 mL/min    Comment: (NOTE) The eGFR has been calculated using the CKD EPI equation. This calculation has not been validated in all clinical situations. eGFR's persistently <60 mL/min signify possible Chronic Kidney Disease.    Anion gap 14 5 - 15  CBC with Differential     Status: Abnormal   Collection Time: 12/05/17  9:53 AM  Result Value Ref Range   WBC 14.0 (H) 4.0 - 10.5 K/uL   RBC 2.83 (L) 4.22 - 5.81 MIL/uL   Hemoglobin 8.4 (L) 13.0 - 17.0 g/dL   HCT 26.6 (L) 39.0 - 52.0 %   MCV 94.0 78.0 - 100.0 fL   MCH 29.7 26.0 - 34.0 pg   MCHC 31.6 30.0 - 36.0 g/dL   RDW 16.6 (H) 11.5 - 15.5 %   Platelets 314 150 - 400 K/uL   Neutrophils Relative % 79 %   Neutro Abs 11.1 (H) 1.7 - 7.7 K/uL   Lymphocytes Relative 8 %   Lymphs Abs 1.2 0.7 - 4.0 K/uL   Monocytes Relative 11 %   Monocytes Absolute 1.5 (H) 0.1 - 1.0 K/uL   Eosinophils Relative 2 %   Eosinophils Absolute 0.2 0.0 - 0.7 K/uL   Basophils Relative 0 %   Basophils Absolute 0.0 0.0 - 0.1 K/uL  I-stat troponin, ED     Status: Abnormal   Collection Time: 12/05/17 10:21 AM  Result Value Ref Range   Troponin i, poc 0.12 (HH) 0.00 - 0.08 ng/mL   Comment NOTIFIED PHYSICIAN    Comment 3            Comment: Due to the release kinetics of cTnI, a negative result within the first hours of the onset of symptoms does not rule out myocardial infarction with certainty. If myocardial infarction is still suspected, repeat the test at appropriate intervals.   I-Stat CG4 Lactic Acid, ED     Status: None   Collection Time: 12/05/17 11:47  AM  Result Value Ref Range   Lactic Acid, Venous 1.21 0.5 - 1.9 mmol/L  I-Stat CG4 Lactic Acid, ED     Status: None  Collection Time: 12/05/17  1:22 PM  Result Value Ref Range   Lactic Acid, Venous 1.33 0.5 - 1.9 mmol/L    Dg Chest 2 View  Result Date: 12/05/2017 CLINICAL DATA:  CHF, shortness of Breath EXAM: CHEST  2 VIEW COMPARISON:  05/05/2017 FINDINGS: Cardiomegaly. Improving vascular congestion and pulmonary edema since prior study. Mild vascular congestion. Bibasilar atelectasis. No effusions or acute bony abnormality. IMPRESSION: Cardiomegaly, vascular congestion, improved since prior study. Minimal bibasilar atelectasis. Electronically Signed   By: Rolm Baptise M.D.   On: 12/05/2017 10:24   Dg Foot 2 Views Right  Result Date: 12/05/2017 CLINICAL DATA:  Diabetic foot wound. EXAM: RIGHT FOOT - 2 VIEW COMPARISON:  Radiographs of December 03, 2017. FINDINGS: Extensive vascular calcifications are noted. Probable osteotomy defect seen involving the proximal base of the first proximal phalanx. Mild posterior calcaneal spurring is noted. Large ulceration or wound is seen overlying the plantar aspect of posterior calcaneus. Underlying lytic destruction is not seen to suggest osteomyelitis. No acute fracture or dislocation is noted. IMPRESSION: Large ulceration or soft tissue wound is seen overlying plantar aspect of posterior calcaneus. There is no definite evidence of underlying osteomyelitis. Electronically Signed   By: Marijo Conception, M.D.   On: 12/05/2017 12:14    ROS: all other systmes reviewed and are negative except as per HPI Blood pressure 121/79, pulse 89, temperature 99.1 F (37.3 C), temperature source Oral, resp. rate 18, SpO2 95 %. . GEN morbidly obese gentleman, intermittently grimacing in pain HEENT EOMI, PERRL NECK no JVD PULM clear bilaterally no c/w/r CV RRR no m/r/g ABD obese, NABS EXT R heel ulcer with pain and swelling, purulent NEURO AAO x 3 SKIN no rashes or  lesions ACCESS: R RC fistula with + T/B  Assessment/Plan: 1 R heel ulcer: ortho has seen, scheduled for R BKA tomorrow.  Blood cultures drawn, on empiric antibiotics 2 ESRD: TTS.  HD orders for tomorrow 3 Hypertension: resuming home meds 4. Anemia of ESRD: on Max ESA and weekly venofer, holding iron with infection 5. Metabolic Bone Disease: hectorol with HD   Shamarie Call 12/05/2017, 3:53 PM

## 2017-12-05 NOTE — ED Notes (Signed)
Ortho at bedside.

## 2017-12-05 NOTE — ED Notes (Signed)
Pt c/o fever and infection in right foot, foot draining greenish foul smelling odor. Pt denies having any chest pain. Pt is dialysis T, TH, S --

## 2017-12-05 NOTE — Progress Notes (Signed)
Pharmacy Antibiotic Note  Thomas [name] is a 51 y.o. male admitted on 12/05/2017 with wound infection.  Pharmacy has been consulted for vancomycin dosing. Pt is afebrile and WBC is elevated at 14. Pt with history of ESRD on HD.   Plan: Vancomycin 2500mg  IV x 1 then 1gm post-HD F/u renal plans, C&S, clinical status and pre-HD vanc level when appropriate     Temp (24hrs), Avg:99.5 F (37.5 C), Min:99.1 F (37.3 C), Max:99.9 F (37.7 C)  Recent Labs  Lab 12/05/17 0953  WBC 14.0*  CREATININE 6.71*    Estimated Creatinine Clearance: 19.6 mL/min (A) (by C-G formula based on SCr of 6.71 mg/dL (H)).    No Known Allergies  Antimicrobials this admission: Vanc 12/7>> Zosyn x 1 12/7  Dose adjustments this admission: N/A  Microbiology results: Pending  Thank you for allowing pharmacy to be a part of this patient's care.  Whitleigh Garramone, [name] 12/05/2017 11:24 AM

## 2017-12-05 NOTE — Progress Notes (Signed)
Pharmacy Antibiotic Note Thomas [name] is a 51 y.o. male admitted on 12/05/2017 with wound infection. Pharmacy was consulted earlier today for vancomycin dosing. Now consulted to dose Zosyn. Received vanc/zosyn in the ED. Vancomycin loading dose 2500 mg IV given today 12/7 at 14:41 and Zosyn 3.375 gm x1  given @ 12:41.  Pt is 99 F and WBC is elevated at 14. Pt with history of ESRD on HD.  Last dialysis session yesterday 12/04/17. MD noted no need for urgent hemodialysis today.  Plan: Continue Vancomycin 1gm post-HD qTTSat Give Zosyn 3.375 gm IV q12h -infuse each dose over 4 hours.     Height: 6\' 6"  (198.1 cm) Weight: 268 lb 15.4 oz (122 kg) IBW/kg (Calculated) : 91.4  Temp (24hrs), Avg:99.3 F (37.4 C), Min:99 F (37.2 C), Max:99.9 F (37.7 C)  Recent Labs  Lab 12/05/17 0953 12/05/17 1147 12/05/17 1322  WBC 14.0*  --   --   CREATININE 6.71*  --   --   LATICACIDVEN  --  1.21 1.33    Estimated Creatinine Clearance: 19.1 mL/min (A) (by C-G formula based on SCr of 6.71 mg/dL (H)).    No Known Allergies  Antimicrobials this admission: Vanc 12/7>> Zosyn 12/7>>  Dose adjustments this admission: n/a  Microbiology results: 12/7 BCx x 2: sent  Thank you for allowing pharmacy to be a part of this patient's care. Nicole Cella, RPh Clinical Pharmacist 8a-330p [phone] Sat/Sun 330p-1030p phone [place] [phone] 12/05/2017 8:56 PM

## 2017-12-05 NOTE — ED Notes (Signed)
Attempted report X1

## 2017-12-05 NOTE — H&P (Signed)
Date: 12/05/2017               Patient Name:  Thomas [name] MRN: [id number]  DOB: 29-Mar-1966 Age / Sex: 51 y.o., male   PCP: Patient, No Pcp Per         Medical Service: Internal Medicine Teaching Service         Attending Physician: Dr. Tanna Furry, MD    First Contact: Dr. Ronalee Red Pager: [phone]  Second Contact: Dr. 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 Wife was at bedside, who helped provide additional history. He was seen at his cardiologist office today, who felt that he required admission given his fever and generalized fatigue.  The patient reports that he has been dealing with this right foot wound for the last 2-3 months. It was initially noted in the chart on September 5 and thought to be secondary to pressure. Patient underwent I&D during that admission. Since then, he has been following with podiatry. Wife and patient both expressed frustration with podiatrist's care for the last few months. Wife states that the wound began as a small ulcer on the bottom of his foot and has progressively worsened. They have been to see podiatry once a week for care, during which they have been doing wet to dry dressings, debridements, and iodine. At his last visit with Podiatry on 12/3, note states that they discussed the possibility of BKA with patient. He was also seen on 12/5 by Podiatry. I do not see a note in the chart, but an AVS shows that he was prescribed Clindamycin. I am not sure if the patient was taking this.  Patient endorses subjective fevers for the last two days. He denies pain in his  foot or leg. States that he has not taken any pain medication except tylenol for his fever. Denies chest pain, shortness of breath, abdominal pain, N/V, or changes in his bowel movements.  Denies smoking, alcohol use, or other illicit drugs. States his wife helps him with his medications and he takes them every day.  ED Course: - BP 118/60, HR 97, RR 16, temp 99.9, O2 96% on RA - WBC 14.0, Hb 8.4. Trop 0.12. Na 132, Cl 94, BUN 30, Cr 6.71. Lactic acid 1.21. BCx pending. - Right foot xray with large ulceration or soft tissue wound on plantar aspect of posterior calcaneus without definite evidence of osteomyelitis. CXR negative for acute changes. - Received vanc/zosyn in the ED. - EDP discussed case with Nephrology and Orthopedic Surgery.  Meds:  Current Meds  Medication Sig  . amLODipine (NORVASC) 5 MG tablet Take 1 tablet (5 mg total) by mouth daily. (Patient taking differently: Take 5 mg by mouth every evening. )  . cinacalcet (SENSIPAR) 60 MG tablet Take 60 mg by mouth daily.  . clindamycin (CLEOCIN) 150 MG capsule Take 1 capsule (150 mg total) by mouth 3 (three) times daily.  Marland Kitchen doxercalciferol (HECTOROL) 4 MCG/2ML injection Inject 2.5 mLs (5 mcg total) into the vein Every Tuesday,Thursday,and Saturday with dialysis.  . ferric gluconate 125 mg in sodium chloride 0.9 %  100 mL Inject 125 mg into the vein Every Tuesday,Thursday,and Saturday with dialysis.  . fosamprenavir (LEXIVA) 700 MG tablet Take 700 mg by mouth 2 (two) times daily.   . insulin aspart (NOVOLOG) 100 UNIT/ML injection CBG 70 - 120: 0 units CBG 121 - 150: 3 units CBG 151 - 200: 4 units CBG 201 - 250: 7 units CBG 251 - 300: 11 units CBG 301 - 350: 15 units CBG 351 - 400: 20 units (Patient taking differently: Inject 0-20 Units into the skin 3 (three) times daily with meals. CBG 70 - 120: 0 units CBG 121 - 150: 3 units CBG 151 - 200: 4 units CBG 201 - 250: 7 units CBG 251 - 300: 11 units CBG 301 - 350: 15 units CBG 351 -  400: 20 units)  . insulin glargine (LANTUS) 100 UNIT/ML injection Inject 0.3 mLs (30 Units total) into the skin daily at 10 pm.  . lisinopril (PRINIVIL,ZESTRIL) 40 MG tablet Take 40 mg by mouth daily.  . metoprolol tartrate (LOPRESSOR) 25 MG tablet Take 1 tablet (25 mg total) by mouth 2 (two) times daily.  . Multiple Vitamin (TAB-A-VITE PO) Take 1 tablet by mouth daily.  . raltegravir (ISENTRESS) 400 MG tablet Take 400 mg by mouth 2 (two) times daily.  . ritonavir (NORVIR) 100 MG capsule Take 100 mg by mouth daily with breakfast.  . sevelamer carbonate (RENVELA) 800 MG tablet Take 2,400 mg by mouth 3 (three) times daily with meals.   . warfarin (COUMADIN) 10 MG tablet Take 10 mg by mouth See admin instructions. Take 10 mg by mouth daily Sunday, Monday, Wednesday and Friday  . warfarin (COUMADIN) 7.5 MG tablet Take 7.5 mg by mouth See admin instructions. Take 7.5 mg by mouth daily on Tuesday, Thursday ans Saturday   Allergies: Allergies as of 12/05/2017  . (No Known Allergies)   Past Medical History:  Diagnosis Date  . Anemia   . Chronic combined systolic and diastolic CHF (congestive heart failure) ([place])   . Diabetes ([place])   . ESRD (end stage renal disease) [facility])    [facility] [address] T, Th, Sat  . HIV disease ([place])   . Hyperparathyroidism, secondary ([place])   . Hypertension   . Hypertensive heart disease with end stage renal disease on dialysis ([place]) 03/13/2017  . SVT (supraventricular tachycardia) ([place])    ? afib or atrial flutter s/p TEE/DCCV with subsequent ablation due to reoccurrence in [place]  . Type 2 diabetes mellitus ([facility])    Family History:  Family History  Problem Relation Age of Onset  . Heart disease Mother   . Diabetes Mother   . Heart disease Father   . Cancer Neg Hx    Social History:  - Denies smoking, alcohol, or other illicit drug use - Lives at home with his wife and mother-in-law - Recently moved down here from [place] for a change in scenery  Review of  Systems: A complete ROS was negative except as per HPI.  Physical Exam: Blood pressure 121/79, pulse 89, temperature 99.1 F (37.3 C), temperature source Oral, resp. rate 18, SpO2 95 %.  GEN: Obese male lying in bed in NAD. Alert and oriented. HENT: [place]/AT. Moist mucous membranes. No visible lesions. EYES: PERRL. Sclera non-icteric. Conjunctiva clear. RESP: Clear to auscultation bilaterally. No wheezes, rales, or rhonchi. No increased work of breathing. CV: Normal rate and regular rhythm. No murmurs, gallops, or rubs. No LE edema. ABD: Soft. Non-tender. Non-distended. Normoactive bowel sounds. EXT: No  edema. Warm. Could not palpate DP pulse on right. L BKA. Right heel and lateral ankle with large foul-smelling ulcers and purulent drainage (pictures included below). Significant tenderness to palpation. NEURO: Cranial nerves II-XII grossly intact. Able to lift all four extremities against gravity. No apparent audiovisual hallucinations. Speech fluent and appropriate. PSYCH: Patient is frustrated and upset at his situation. Well-groomed; speech is appropriate and on-subject.        EKG: sinus rhythm. 1st degree AV block  CXR:  Cardiomegaly, vascular congestion, improved since prior study. Minimal bibasilar atelectasis.  Right foot xray:  Large ulceration or soft tissue wound is seen overlying plantar aspect of posterior calcaneus. There is no definite evidence of underlying osteomyelitis.  Labs CBC Latest Ref Rng & Units 12/05/2017 11/12/2017 09/24/2017  WBC 4.0 - 10.5 K/uL 14.0(H) 12.8(H) -  Hemoglobin 13.0 - 17.0 g/dL 8.4(L) 9.5(L) 11.2(L)  Hematocrit 39.0 - 52.0 % 26.6(L) 29.8(L) 33.0(L)  Platelets 150 - 400 K/uL 314 308 -   CMP Latest Ref Rng & Units 12/05/2017 11/12/2017 09/24/2017  Glucose 65 - 99 mg/dL 315(H) 350(H) 226(H)  BUN 6 - 20 mg/dL 30(H) 33(H) -  Creatinine 0.61 - 1.24 mg/dL 6.71(H) 8.09(H) -  Sodium 135 - 145 mmol/L 132(L) 135 139  Potassium 3.5 - 5.1 mmol/L 3.9  4.2 5.0  Chloride 101 - 111 mmol/L 94(L) 95(L) -  CO2 22 - 32 mmol/L 24 26 -  Calcium 8.9 - 10.3 mg/dL 8.6(L) 9.3 -  Total Protein 6.5 - 8.1 g/dL 7.7 - -  Total Bilirubin 0.3 - 1.2 mg/dL 0.9 - -  Alkaline Phos 38 - 126 U/L 102 - -  AST 15 - 41 U/L 15 - -  ALT 17 - 63 U/L 14(L) - -   Lactic acid 1.21 -> 1.33 BCx pending Troponin 0.12  Assessment & Plan by Problem: Active Problems:   * No active hospital problems. 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 X-ray without definite evidence of osteomyelitis, however, Orthopedics has evaluated the patient and determined that patient requires transtibial amputation.  Progressive right foot wound Concern for cellulitis vs osteomyelitis. Mild leukocytosis to 14.0. Temperature 99.9 -> 99.1. X-ray negative for osteomyelitis. S/p vanc/zosyn x1 in the ED. Orthopedics consulted in the ED, who recommend that the patient's only option is amputation. - Orthopedics following; appreciate their assistance - Plan for OR 12/8 at 7:15am for transtibial amputation - Will continue vanc/zosyn - F/u BCx - CBC in AM - PT/OT consult  ESRD with HD TuThSa Last dialysis session yesterday. BUN 30, Cr 6.71. K 3.9. Nephrology has been consulted. No need for urgent hemodialysis today. - Nephrology following, appreciate their assistance - HD tomorrow after surgery - BMET in AM  DM2 Home regimen includes 30u Lantus QHS and SSI. - CBG monitoring - Lantus 10u QHS and SSI  HTN, CHF, ?afib/flutter Home BP regimen includes amlodipine 5mg  qday, lisinopril 40mg  qday, and metoprolol 25mg  BID. On warfarin for anticoagulation. CHADS2-VASc score of at least 3, which gives him 4.6% annual risk of stroke. - Telemetry - Continue home amlodipine, lisinopril, and metoprolol - Hold home warfarin for  surgery, then restart after - F/u INR  HIV Last viral load 30 in March 2018, CD4 count 310 in May 2018. - Continue home ritonavir, fosamprenavir, and raltegravir  Diet: Renal/carb modified;  NPO at midnight VTE PPx: Holding home warfarin with plan to restart after surgery Dispo: Admit patient to Inpatient with expected length of stay greater than 2 midnights.  Signed: Colbert Ewing, MD 12/05/2017, 3:05 PM  Pager: Mamie Nick [phone]

## 2017-12-05 NOTE — ED Notes (Signed)
CRITICAL VALUE ALERT  Critical Value:  Troponin 0.12  Date & Time Notied:  12/05/2017 1035  Provider Notified: Dr. Jeneen Rinks   Orders Received/Actions taken: Pt roomed.

## 2017-12-06 ENCOUNTER — Encounter (HOSPITAL_COMMUNITY)
Admission: EM | Disposition: A | Discharge status: Rehabilitation Facility | Payer: Self-pay | Source: Home / Self Care | Attending: Internal Medicine

## 2017-12-06 ENCOUNTER — Encounter (HOSPITAL_COMMUNITY): Payer: Self-pay | Admitting: Certified Registered Nurse Anesthetist

## 2017-12-06 ENCOUNTER — Inpatient Hospital Stay (HOSPITAL_COMMUNITY): Payer: Medicare Other | Admitting: Anesthesiology

## 2017-12-06 ENCOUNTER — Inpatient Hospital Stay (HOSPITAL_COMMUNITY): Payer: Medicare Other

## 2017-12-06 DIAGNOSIS — Z79899 Other long term (current) drug therapy: Secondary | ICD-10-CM

## 2017-12-06 DIAGNOSIS — E669 Obesity, unspecified: Secondary | ICD-10-CM

## 2017-12-06 DIAGNOSIS — E1169 Type 2 diabetes mellitus with other specified complication: Secondary | ICD-10-CM

## 2017-12-06 DIAGNOSIS — I132 Hypertensive heart and chronic kidney disease with heart failure and with stage 5 chronic kidney disease, or end stage renal disease: Secondary | ICD-10-CM

## 2017-12-06 DIAGNOSIS — E1122 Type 2 diabetes mellitus with diabetic chronic kidney disease: Secondary | ICD-10-CM

## 2017-12-06 DIAGNOSIS — Z8249 Family history of ischemic heart disease and other diseases of the circulatory system: Secondary | ICD-10-CM

## 2017-12-06 DIAGNOSIS — I4891 Unspecified atrial fibrillation: Secondary | ICD-10-CM

## 2017-12-06 DIAGNOSIS — M869 Osteomyelitis, unspecified: Secondary | ICD-10-CM

## 2017-12-06 DIAGNOSIS — Z833 Family history of diabetes mellitus: Secondary | ICD-10-CM

## 2017-12-06 DIAGNOSIS — I5042 Chronic combined systolic (congestive) and diastolic (congestive) heart failure: Secondary | ICD-10-CM

## 2017-12-06 HISTORY — PX: AMPUTATION: SHX166

## 2017-12-06 HISTORY — PX: APPLICATION OF WOUND VAC: SHX5189

## 2017-12-06 LAB — RENAL FUNCTION PANEL
ALBUMIN: 2.3 g/dL — AB (ref 3.5–5.0)
ANION GAP: 14 (ref 5–15)
BUN: 43 mg/dL — ABNORMAL HIGH (ref 6–20)
CHLORIDE: 94 mmol/L — AB (ref 101–111)
CO2: 22 mmol/L (ref 22–32)
Calcium: 7.9 mg/dL — ABNORMAL LOW (ref 8.9–10.3)
Creatinine, Ser: 9 mg/dL — ABNORMAL HIGH (ref 0.61–1.24)
GFR calc Af Amer: 7 mL/min — ABNORMAL LOW (ref >60)
GFR, EST NON AFRICAN AMERICAN: 6 mL/min — AB (ref >60)
GLUCOSE: 229 mg/dL — AB (ref 65–99)
PHOSPHORUS: 2.9 mg/dL (ref 2.5–4.6)
POTASSIUM: 3.7 mmol/L (ref 3.5–5.1)
Sodium: 130 mmol/L — ABNORMAL LOW (ref 135–145)

## 2017-12-06 LAB — SURGICAL PCR SCREEN
MRSA, PCR: NEGATIVE
Staphylococcus aureus: NEGATIVE

## 2017-12-06 LAB — BASIC METABOLIC PANEL
ANION GAP: 15 (ref 5–15)
BUN: 41 mg/dL — ABNORMAL HIGH (ref 6–20)
CALCIUM: 8.2 mg/dL — AB (ref 8.9–10.3)
CO2: 25 mmol/L (ref 22–32)
CREATININE: 8.65 mg/dL — AB (ref 0.61–1.24)
Chloride: 92 mmol/L — ABNORMAL LOW (ref 101–111)
GFR, EST AFRICAN AMERICAN: 7 mL/min — AB (ref >60)
GFR, EST NON AFRICAN AMERICAN: 6 mL/min — AB (ref >60)
Glucose, Bld: 277 mg/dL — ABNORMAL HIGH (ref 65–99)
Potassium: 4.1 mmol/L (ref 3.5–5.1)
SODIUM: 132 mmol/L — AB (ref 135–145)

## 2017-12-06 LAB — GLUCOSE, CAPILLARY
GLUCOSE-CAPILLARY: 244 mg/dL — AB (ref 65–99)
GLUCOSE-CAPILLARY: 224 mg/dL — AB (ref 65–99)
GLUCOSE-CAPILLARY: 253 mg/dL — AB (ref 65–99)
GLUCOSE-CAPILLARY: 220 mg/dL — AB (ref 65–99)
Glucose-Capillary: 191 mg/dL — ABNORMAL HIGH (ref 65–99)
Glucose-Capillary: 238 mg/dL — ABNORMAL HIGH (ref 65–99)
Glucose-Capillary: 243 mg/dL — ABNORMAL HIGH (ref 65–99)

## 2017-12-06 LAB — CBC
HCT: 23.8 % — ABNORMAL LOW (ref 39.0–52.0)
Hemoglobin: 7.6 g/dL — ABNORMAL LOW (ref 13.0–17.0)
MCH: 29.5 pg (ref 26.0–34.0)
MCHC: 31.9 g/dL (ref 30.0–36.0)
MCV: 92.2 fL (ref 78.0–100.0)
Platelets: 309 10*3/uL (ref 150–400)
RBC: 2.58 MIL/uL — ABNORMAL LOW (ref 4.22–5.81)
RDW: 16.5 % — AB (ref 11.5–15.5)
WBC: 11.8 10*3/uL — AB (ref 4.0–10.5)

## 2017-12-06 LAB — PROTIME-INR
INR: 3.56
PROTHROMBIN TIME: 35.3 s — AB (ref 11.4–15.2)

## 2017-12-06 SURGERY — AMPUTATION BELOW KNEE
Anesthesia: General | Site: Leg Upper | Laterality: Right

## 2017-12-06 MED ORDER — HYDROMORPHONE HCL 1 MG/ML IJ SOLN
INTRAMUSCULAR | Status: AC
  Administered 2017-12-06: 0.5 mg via INTRAVENOUS
  Filled 2017-12-06: qty 1

## 2017-12-06 MED ORDER — ONDANSETRON HCL 4 MG/2ML IJ SOLN
INTRAMUSCULAR | Status: AC
  Filled 2017-12-06: qty 2

## 2017-12-06 MED ORDER — PROMETHAZINE HCL 25 MG/ML IJ SOLN: 6.2500 mg | INTRAMUSCULAR | Status: DC | PRN

## 2017-12-06 MED ORDER — HYDROMORPHONE HCL 1 MG/ML IJ SOLN
INTRAMUSCULAR | Status: AC
  Administered 2017-12-06: 1 mg via INTRAVENOUS
  Filled 2017-12-06: qty 1

## 2017-12-06 MED ORDER — ACETAMINOPHEN 650 MG RE SUPP: 650.0000 mg | RECTAL | Status: DC | PRN

## 2017-12-06 MED ORDER — HYDROMORPHONE HCL 1 MG/ML IJ SOLN
1.0000 mg | INTRAMUSCULAR | Status: AC | PRN
  Administered 2017-12-06 (×3): 1 mg via INTRAVENOUS

## 2017-12-06 MED ORDER — METOCLOPRAMIDE HCL 5 MG PO TABS: 5.0000 mg | ORAL_TABLET | Freq: Three times a day (TID) | ORAL | Status: DC | PRN

## 2017-12-06 MED ORDER — HYDROMORPHONE HCL 1 MG/ML IJ SOLN
0.5000 mg | Freq: Once | INTRAMUSCULAR | Status: AC
  Administered 2017-12-06: 0.5 mg via INTRAVENOUS
  Filled 2017-12-06: qty 0.5

## 2017-12-06 MED ORDER — FENTANYL CITRATE (PF) 100 MCG/2ML IJ SOLN
INTRAMUSCULAR | Status: DC | PRN
  Administered 2017-12-06 (×3): 50 ug via INTRAVENOUS
  Administered 2017-12-06 (×2): 25 ug via INTRAVENOUS
  Administered 2017-12-06 (×4): 50 ug via INTRAVENOUS

## 2017-12-06 MED ORDER — 0.9 % SODIUM CHLORIDE (POUR BTL) OPTIME
TOPICAL | Status: DC | PRN
  Administered 2017-12-06: 1000 mL

## 2017-12-06 MED ORDER — CAMPHOR-MENTHOL 0.5-0.5 % EX LOTN
TOPICAL_LOTION | CUTANEOUS | Status: DC | PRN
  Administered 2017-12-06: 23:00:00 via TOPICAL
  Filled 2017-12-06 (×2): qty 222

## 2017-12-06 MED ORDER — DIPHENHYDRAMINE HCL 25 MG PO CAPS
25.0000 mg | ORAL_CAPSULE | Freq: Three times a day (TID) | ORAL | Status: AC | PRN
  Administered 2017-12-06 – 2017-12-07 (×2): 25 mg via ORAL
  Filled 2017-12-06 (×2): qty 1

## 2017-12-06 MED ORDER — DOXERCALCIFEROL 4 MCG/2ML IV SOLN
INTRAVENOUS | Status: AC
  Filled 2017-12-06: qty 4

## 2017-12-06 MED ORDER — INSULIN ASPART 100 UNIT/ML ~~LOC~~ SOLN
6.0000 [IU] | Freq: Once | SUBCUTANEOUS | Status: AC
  Administered 2017-12-06: 6 [IU] via INTRAVENOUS

## 2017-12-06 MED ORDER — OXYCODONE HCL 5 MG PO TABS
ORAL_TABLET | ORAL | Status: AC
  Filled 2017-12-06: qty 2

## 2017-12-06 MED ORDER — ACETAMINOPHEN 325 MG PO TABS
650.0000 mg | ORAL_TABLET | ORAL | Status: DC | PRN
  Administered 2017-12-07 – 2017-12-08 (×3): 650 mg via ORAL
  Filled 2017-12-06 (×3): qty 2

## 2017-12-06 MED ORDER — HYDROMORPHONE HCL 1 MG/ML IJ SOLN
INTRAMUSCULAR | Status: AC
  Administered 2017-12-06: 1 mg via INTRAVENOUS
  Filled 2017-12-06: qty 2

## 2017-12-06 MED ORDER — OXYCODONE HCL 5 MG PO TABS
5.0000 mg | ORAL_TABLET | ORAL | Status: DC | PRN
  Administered 2017-12-08: 5 mg via ORAL
  Filled 2017-12-06 (×2): qty 1

## 2017-12-06 MED ORDER — VANCOMYCIN HCL IN DEXTROSE 1-5 GM/200ML-% IV SOLN
INTRAVENOUS | Status: AC
  Filled 2017-12-06: qty 200

## 2017-12-06 MED ORDER — PROPOFOL 10 MG/ML IV BOLUS
INTRAVENOUS | Status: DC | PRN
  Administered 2017-12-06: 200 mg via INTRAVENOUS

## 2017-12-06 MED ORDER — INSULIN ASPART 100 UNIT/ML ~~LOC~~ SOLN: 6.0000 [IU] | Freq: Once | SUBCUTANEOUS | Status: DC

## 2017-12-06 MED ORDER — POLYETHYLENE GLYCOL 3350 17 G PO PACK: 17.0000 g | PACK | Freq: Every day | ORAL | Status: DC | PRN

## 2017-12-06 MED ORDER — BISACODYL 10 MG RE SUPP: 10.0000 mg | Freq: Every day | RECTAL | Status: DC | PRN

## 2017-12-06 MED ORDER — INSULIN ASPART 100 UNIT/ML ~~LOC~~ SOLN
SUBCUTANEOUS | Status: AC
  Administered 2017-12-06: 6 [IU] via INTRAVENOUS
  Filled 2017-12-06: qty 1

## 2017-12-06 MED ORDER — DOCUSATE SODIUM 100 MG PO CAPS
100.0000 mg | ORAL_CAPSULE | Freq: Two times a day (BID) | ORAL | Status: DC
  Administered 2017-12-06 – 2017-12-08 (×4): 100 mg via ORAL
  Filled 2017-12-06 (×5): qty 1

## 2017-12-06 MED ORDER — ONDANSETRON HCL 4 MG/2ML IJ SOLN
INTRAMUSCULAR | Status: DC | PRN
  Administered 2017-12-06: 4 mg via INTRAVENOUS

## 2017-12-06 MED ORDER — MAGNESIUM CITRATE PO SOLN: 1.0000 | Freq: Once | ORAL | Status: DC | PRN

## 2017-12-06 MED ORDER — FENTANYL CITRATE (PF) 250 MCG/5ML IJ SOLN
INTRAMUSCULAR | Status: AC
  Filled 2017-12-06: qty 5

## 2017-12-06 MED ORDER — ONDANSETRON HCL 4 MG/2ML IJ SOLN: 4.0000 mg | Freq: Four times a day (QID) | INTRAMUSCULAR | Status: DC | PRN

## 2017-12-06 MED ORDER — ACETAMINOPHEN 10 MG/ML IV SOLN
1000.0000 mg | Freq: Once | INTRAVENOUS | Status: AC
  Administered 2017-12-06: 1000 mg via INTRAVENOUS
  Filled 2017-12-06: qty 100

## 2017-12-06 MED ORDER — HYDROMORPHONE HCL 1 MG/ML IJ SOLN
0.2500 mg | INTRAMUSCULAR | Status: DC | PRN
  Administered 2017-12-06 (×3): 0.5 mg via INTRAVENOUS

## 2017-12-06 MED ORDER — HYDROMORPHONE HCL 1 MG/ML IJ SOLN: 1.0000 mg | INTRAMUSCULAR | Status: DC | PRN

## 2017-12-06 MED ORDER — DARBEPOETIN ALFA 200 MCG/0.4ML IJ SOSY
PREFILLED_SYRINGE | INTRAMUSCULAR | Status: AC
  Filled 2017-12-06: qty 0.4

## 2017-12-06 MED ORDER — LIDOCAINE HCL (CARDIAC) 20 MG/ML IV SOLN
INTRAVENOUS | Status: DC | PRN
  Administered 2017-12-06: 60 mg via INTRAVENOUS

## 2017-12-06 MED ORDER — ONDANSETRON HCL 4 MG PO TABS: 4.0000 mg | ORAL_TABLET | Freq: Four times a day (QID) | ORAL | Status: DC | PRN

## 2017-12-06 MED ORDER — ACETAMINOPHEN 10 MG/ML IV SOLN
INTRAVENOUS | Status: DC | PRN
  Administered 2017-12-06: 1000 mg via INTRAVENOUS

## 2017-12-06 MED ORDER — NALOXONE HCL 0.4 MG/ML IJ SOLN
0.4000 mg | INTRAMUSCULAR | Status: DC | PRN
  Administered 2017-12-06: 0.4 mg via INTRAVENOUS
  Filled 2017-12-06: qty 1

## 2017-12-06 MED ORDER — METOCLOPRAMIDE HCL 5 MG/ML IJ SOLN: 5.0000 mg | Freq: Three times a day (TID) | INTRAMUSCULAR | Status: DC | PRN

## 2017-12-06 MED ORDER — PROPOFOL 10 MG/ML IV BOLUS
INTRAVENOUS | Status: AC
  Filled 2017-12-06: qty 20

## 2017-12-06 MED ORDER — ACETAMINOPHEN 10 MG/ML IV SOLN
INTRAVENOUS | Status: AC
  Filled 2017-12-06: qty 100

## 2017-12-06 MED ORDER — OXYCODONE HCL 5 MG PO TABS
10.0000 mg | ORAL_TABLET | ORAL | Status: DC | PRN
  Administered 2017-12-06 – 2017-12-10 (×10): 10 mg via ORAL
  Filled 2017-12-06 (×8): qty 2

## 2017-12-06 MED ORDER — SODIUM CHLORIDE 0.9 % IV SOLN: INTRAVENOUS | Status: DC

## 2017-12-06 MED ORDER — BLISTEX MEDICATED EX OINT
TOPICAL_OINTMENT | CUTANEOUS | Status: DC | PRN
  Filled 2017-12-06: qty 6.3

## 2017-12-06 MED ORDER — WHITE PETROLATUM EX OINT
TOPICAL_OINTMENT | CUTANEOUS | Status: AC
  Administered 2017-12-06: 11:00:00
  Filled 2017-12-06: qty 28.35

## 2017-12-06 MED ORDER — PHENYLEPHRINE 40 MCG/ML (10ML) SYRINGE FOR IV PUSH (FOR BLOOD PRESSURE SUPPORT)
PREFILLED_SYRINGE | INTRAVENOUS | Status: AC
  Filled 2017-12-06: qty 10

## 2017-12-06 SURGICAL SUPPLY — 32 items
BLADE SAW RECIP 87.9 MT (BLADE) ×4 IMPLANT
BLADE SURG 21 STRL SS (BLADE) ×4 IMPLANT
BNDG COHESIVE 6X5 TAN STRL LF (GAUZE/BANDAGES/DRESSINGS) ×8 IMPLANT
BNDG GAUZE ELAST 4 BULKY (GAUZE/BANDAGES/DRESSINGS) ×8 IMPLANT
COVER SURGICAL LIGHT HANDLE (MISCELLANEOUS) ×4 IMPLANT
CUFF TOURNIQUET SINGLE 34IN LL (TOURNIQUET CUFF) IMPLANT
CUFF TOURNIQUET SINGLE 44IN (TOURNIQUET CUFF) IMPLANT
DRAPE INCISE IOBAN 66X45 STRL (DRAPES) IMPLANT
DRAPE U-SHAPE 47X51 STRL (DRAPES) ×4 IMPLANT
DRESSING PREVENA PLUS CUSTOM (GAUZE/BANDAGES/DRESSINGS) ×4 IMPLANT
DRSG PREVENA PLUS CUSTOM (GAUZE/BANDAGES/DRESSINGS) ×8
ELECT REM PT RETURN 9FT ADLT (ELECTROSURGICAL) ×4
ELECTRODE REM PT RTRN 9FT ADLT (ELECTROSURGICAL) ×2 IMPLANT
GLOVE BIOGEL PI IND STRL 9 (GLOVE) ×2 IMPLANT
GLOVE BIOGEL PI INDICATOR 9 (GLOVE) ×2
GLOVE SURG ORTHO 9.0 STRL STRW (GLOVE) ×4 IMPLANT
GOWN STRL REUS W/ TWL XL LVL3 (GOWN DISPOSABLE) ×4 IMPLANT
GOWN STRL REUS W/TWL XL LVL3 (GOWN DISPOSABLE) ×4
KIT BASIN OR (CUSTOM PROCEDURE TRAY) ×4 IMPLANT
KIT ROOM TURNOVER OR (KITS) ×4 IMPLANT
MANIFOLD NEPTUNE II (INSTRUMENTS) ×4 IMPLANT
NS IRRIG 1000ML POUR BTL (IV SOLUTION) ×4 IMPLANT
PACK ORTHO EXTREMITY (CUSTOM PROCEDURE TRAY) ×4 IMPLANT
PAD ARMBOARD 7.5X6 YLW CONV (MISCELLANEOUS) ×4 IMPLANT
SPONGE LAP 18X18 X RAY DECT (DISPOSABLE) IMPLANT
STAPLER VISISTAT 35W (STAPLE) IMPLANT
STOCKINETTE IMPERVIOUS LG (DRAPES) ×4 IMPLANT
SUT SILK 2 0 (SUTURE) ×2
SUT SILK 2-0 18XBRD TIE 12 (SUTURE) ×2 IMPLANT
SUT VIC AB 1 CTX 27 (SUTURE) IMPLANT
TOWEL OR 17X26 10 PK STRL BLUE (TOWEL DISPOSABLE) ×4 IMPLANT
WND VAC CANISTER 500ML (MISCELLANEOUS) ×4 IMPLANT

## 2017-12-06 NOTE — Progress Notes (Signed)
Patient returned from HD and is sweating profusely. CBG checked and within normal parameters. Patient is resting comfortably with no complaints. MD notified. MD stated that they would come by and see the patient. Will continue to monitor.

## 2017-12-06 NOTE — Progress Notes (Signed)
Patient continues to be in pain despite 0.5 mg IV dilaudid given to patient for one time order. MD notified. MD aware. Will continue to monitor.

## 2017-12-06 NOTE — Progress Notes (Signed)
Patient returned from HD and O2 sats were 56% on RA. Patient placed on 6L [place] and sats were 84%. Patient then placed on 10 L NRB. MD at bedside. MD ordered for a STAT EKG. EKG performed. Orders placed for Narcan. Narcan given per order. MD to place orders. Will continue to monitor.

## 2017-12-06 NOTE — Progress Notes (Signed)
Pt would not allow the assess of his wounds on his right heel and ankle nor would he allow any wound care to that extremity.

## 2017-12-06 NOTE — Progress Notes (Signed)
New Admission Note:  Arrival Method: Via bed from ED. Mental Orientation: Alert & Oriented x4 Telemetry: CCMD verified. Assessment: Completed Skin: refer to flowsheet IV: Left Forarm  Pain: 0/10 Safety Measures: Safety Fall Prevention Plan discussed with patient. Admission: Completed [facility] [place] Orientation: Patient has been orientated to the room, unit and the staff. Family: Wife at the bedside.   Orders have been reviewed and implemented. Will continue to monitor the patient. Call light has been placed within reach.   Vassie Moselle, RN  Phone Number: [phone]

## 2017-12-06 NOTE — Procedures (Signed)
   I was present at this dialysis session, have reviewed the session itself and made  appropriate changes [name] MD [place] pager [phone]   12/06/2017, 1:37 PM

## 2017-12-06 NOTE — Anesthesia Procedure Notes (Signed)
Procedure Name: LMA Insertion Date/Time: 12/06/2017 8:00 AM Performed by: Inda Coke, CRNA Pre-anesthesia Checklist: Patient identified, Emergency Drugs available, Suction available and Patient being monitored Patient Re-evaluated:Patient Re-evaluated prior to induction Oxygen Delivery Method: Circle System Utilized Preoxygenation: Pre-oxygenation with 100% oxygen Induction Type: IV induction Ventilation: Mask ventilation without difficulty LMA: LMA inserted LMA Size: 5.0 Number of attempts: 1 Airway Equipment and Method: Bite block Placement Confirmation: positive ETCO2 and breath sounds checked- equal and bilateral Tube secured with: Tape Dental Injury: Teeth and Oropharynx as per pre-operative assessment

## 2017-12-06 NOTE — Progress Notes (Signed)
[facility] Progress Note  Subjective: on HD, had R leg BKA surg this am.  Pain post op, no sob or other c/o.   Vitals:   12/06/17 0957 12/06/17 0958 12/06/17 1005 12/06/17 1008  BP:   111/69 108/68  Pulse:   (!) 117 (!) 123  Resp:    16  Temp:    98.6 F (37 C)  TempSrc:      SpO2: 95% 95% 94% 97%  Weight:      Height:        Inpatient medications: . HYDROmorphone      . amLODipine  5 mg Oral QPM  . darbepoetin (ARANESP) injection - DIALYSIS  200 mcg Intravenous Q Sat-HD  . docusate sodium  100 mg Oral BID  . doxercalciferol  5 mcg Intravenous Q T,Th,Sa-HD  . fosamprenavir  700 mg Oral BID  . insulin aspart  0-20 Units Subcutaneous TID WC  . insulin glargine  10 Units Subcutaneous QHS  . lisinopril  40 mg Oral Daily  . metoprolol tartrate  25 mg Oral BID  . raltegravir  400 mg Oral BID  . ritonavir  100 mg Oral Q breakfast   . sodium chloride    . sodium chloride    . sodium chloride    . piperacillin-tazobactam (ZOSYN)  IV Stopped (12/06/17 0327)  . vancomycin     sodium chloride, sodium chloride, acetaminophen **OR** acetaminophen, bisacodyl, diphenhydrAMINE, HYDROmorphone (DILAUDID) injection, HYDROmorphone (DILAUDID) injection, lidocaine (PF), lidocaine-prilocaine, lip balm, magnesium citrate, metoCLOPramide **OR** metoCLOPramide (REGLAN) injection, ondansetron **OR** ondansetron (ZOFRAN) IV, oxyCODONE, oxyCODONE, pentafluoroprop-tetrafluoroeth, polyethylene glycol, senna-docusate  Exam: GEN morbidly obese gentleman, intermittently grimacing in pain HEENT EOMI, PERRL NECK no JVD PULM clear bilaterally no c/w/r CV RRR no m/r/g ABD obese, NABS EXT new R BKA, vac in place; 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 R foot infection - now is sp R BKA yest  , 12/05/17 2. ESRD usual HD TTS 3. Vol - should lose 2-3kg after leg amp 4. HTN - resume home meds 5. Anemia of CKD - on esa and weekly venofer, holding Fe w infection 6. MBD - hect w HD  Plan - HD today   [name] MD [facility] pager [phone]   12/06/2017, 1:32 PM   Recent Labs  Lab 12/05/17 0953 12/06/17 0419 12/06/17 1204  NA 132* 132* 130*  K 3.9 4.1 3.7  CL 94* 92* 94*  CO2 24 25 22   GLUCOSE 315* 277* 229*  BUN 30* 41* 43*  CREATININE 6.71* 8.65* 9.00*  CALCIUM 8.6* 8.2* 7.9*  PHOS  --   --  2.9   Recent Labs  Lab 12/05/17 0953 12/06/17 1204  AST 15  --   ALT 14*  --   ALKPHOS 102  --   BILITOT 0.9  --   PROT 7.7  --   ALBUMIN 2.6* 2.3*   Recent Labs  Lab 12/05/17 0953 12/06/17 0419  WBC 14.0* 11.8*  NEUTROABS 11.1*  --   HGB 8.4* 7.6*  HCT 26.6* 23.8*  MCV 94.0 92.2  PLT 314 309   Iron/TIBC/Ferritin/ %Sat No results found for: IRON, TIBC, FERRITIN, IRONPCTSAT

## 2017-12-06 NOTE — Transfer of Care (Signed)
Immediate Anesthesia Transfer of Care Note  Patient: Thomas [name]  Procedure(s) Performed: AMPUTATION BELOW KNEE (Right Leg Upper)  Patient Location: PACU  Anesthesia Type:General  Level of Consciousness: awake and alert   Airway & Oxygen Therapy: Patient Spontanous Breathing and Patient connected to face mask oxygen  Post-op Assessment: Report given to RN and Post -op Vital signs reviewed and stable  Post vital signs: Reviewed and stable  Last Vitals:  Vitals:   12/05/17 2054 12/06/17 0519  BP: 121/68 118/66  Pulse: 94 98  Resp: 18 18  Temp: 37.2 C 36.9 C  SpO2: 97% 95%    Last Pain:  Vitals:   12/06/17 0519  TempSrc: Oral  PainSc:          Complications: No apparent anesthesia complications

## 2017-12-06 NOTE — Progress Notes (Addendum)
Paged to bedside for pt with continued diaphoresis that began in dialysis.  When I arrived nurse reports decreased O2 saturations with normal RR, normal BP/ temp and cbg of 220.  Patient received heavy doses of opiates for BKA procedure.   On evaluating pt he appeared diaphoretic with normal breath sounds and mild tachycardia, jvp of 6cm.  He was taking shallow breaths, was very somnolent and difficult to keep awake, he denied chest pain, he was able to answer where he was and his name but quickly fell back asleep.  Ordered a STAT EKG which demonstrated sinus tach with apcs and no ST changes.  A chest X-ray was ordered which showed no acute process.  His oxygenation improved with non rebreather but when attempts were made to put back on nasal canula pt continued to desat and continued to remain somnolent.  Administered 0.4mg  of narcan, pt became alert, began to breathe more deeply and increased oxygenation.  Patient was in severe pain, will titrate pain medicine back up carefully.

## 2017-12-06 NOTE — Anesthesia Postprocedure Evaluation (Signed)
Anesthesia Post Note  Patient: Thomas [name]  Procedure(s) Performed: AMPUTATION BELOW KNEE (Right Leg Upper) APPLICATION OF WOUND VAC (Leg Upper)     Patient location during evaluation: PACU Anesthesia Type: General Level of consciousness: awake and alert Pain management: pain level controlled Vital Signs Assessment: post-procedure vital signs reviewed and stable Respiratory status: spontaneous breathing, nonlabored ventilation, respiratory function stable and patient connected to nasal cannula oxygen Cardiovascular status: blood pressure returned to baseline and stable Postop Assessment: no apparent nausea or vomiting Anesthetic complications: no    Last Vitals:  Vitals:   12/06/17 1300 12/06/17 1330  BP: (P) 124/71 (P) 110/68  Pulse: (!) (P) 109 (P) 77  Resp:    Temp:    SpO2:      Last Pain:  Vitals:   12/06/17 1155  TempSrc: (P) Oral  PainSc:                  Tiajuana Amass

## 2017-12-06 NOTE — Progress Notes (Signed)
PT Cancellation Note  Patient Details Name: Thomas [name] MRN: [id number] DOB: 06-11-1966   Cancelled Treatment:    Reason Eval/Treat Not Completed: Patient at procedure or test/unavailable   Patient in OR this am and then to HD.  Will return tomorrow for PT evaluation.   Despina Pole 12/06/2017, 7:48 AM Carita Pian. Sanjuana Kava, [place] Pager [phone]

## 2017-12-06 NOTE — Interval H&P Note (Signed)
History and Physical Interval Note:  12/06/2017 7:22 AM  Thomas [name]  has presented today for surgery, with the diagnosis of abscess , osteo right foot  The various methods of treatment have been discussed with the patient and family. After consideration of risks, benefits and other options for treatment, the patient has consented to  Procedure(s): AMPUTATION BELOW KNEE (Right) as a surgical intervention .  The patient's history has been reviewed, patient examined, no change in status, stable for surgery.  I have reviewed the patient's chart and labs.  Questions were answered to the patient's satisfaction.     [name]

## 2017-12-06 NOTE — Progress Notes (Signed)
Patient admitted from cardiology clinic to medicine team for leg infection, currently managed by internal medicine ortho. Patient is s/p surgery this morning, and thus no preop cardiac assessment is required. Cards note mentions his prior cardiology records have been requested. At this time I don't see an active indication for inpatient cardiology to follow as inpatient, if needed please call us back to follow.    Carlyle Dolly MD

## 2017-12-06 NOTE — Op Note (Signed)
   Date of Surgery: 12/06/2017  INDICATIONS: [name] is a 51 y.o.-year-old male who presents with abscess osteomyelitis and ulceration right heel.  Marland Kitchen  PREOPERATIVE DIAGNOSIS: Abscess ulceration osteomyelitis right calcaneus chronic  POSTOPERATIVE DIAGNOSIS: Same.  PROCEDURE: Transtibial amputation Application of Prevena wound VAC  SURGEON: Sharol Given, M.D.  ANESTHESIA:  general  IV FLUIDS AND URINE: See anesthesia.  Findings: Severe calcification of the vessels at the level of the below knee amputation.  Patient had extensive hard plaque within the popliteal vessels.  COMPLICATIONS: None.  DESCRIPTION OF PROCEDURE: The patient was brought to the operating room and underwent a general anesthetic. After adequate levels of anesthesia were obtained patient's lower extremity was prepped using DuraPrep draped into a sterile field. A timeout was called. The foot was draped out of the sterile field with impervious stockinette. A transverse incision was made 11 cm distal to the tibial tubercle. This curved proximally and a large posterior flap was created. The tibia was transected 1 cm proximal to the skin incision. The fibula was transected just proximal to the tibial incision. The tibia was beveled anteriorly. A large posterior flap was created. The sciatic nerve was pulled cut and allowed to retract. The vascular bundles were suture ligated with 2-0 silk. The deep and superficial fascial layers were closed using #1 Vicryl. The skin was closed using staples and 2-0 nylon. The wound was covered with a Prevena wound VAC. There was a good suction fit. Patient was extubated taken to the PACU in stable condition.   DISCHARGE PLANNING:  Antibiotic duration: Continue IV antibiotics for 24 hours postoperatively then no further antibiotics required.  Weightbearing: Nonweightbearing on the right.  Pain medication: As needed.  Dressing care/ Wound VAC: Wound VAC to remain in place for 1 week.  Will have  [employer] provide a stump shrinker.  Discharge to: Anticipate discharge to skilled nursing facility.  Follow-up: In the office 1 week post operative.  Meridee Score, MD [place] 8:41 AM

## 2017-12-06 NOTE — Progress Notes (Signed)
Subjective: Patient post-op this morning and seen in the PACU on rounds. He is awake and alert and reports pain at the surgical site. Denies nausea.   Objective: Vital signs in last 24 hours: Vitals:   12/06/17 0519 12/06/17 0849 12/06/17 0900 12/06/17 0915  BP: 118/66 134/80 (!) 150/81 (!) 151/82  Pulse: 98 (!) 115 (!) 110 (!) 113  Resp: 18 20 16 16   Temp: 98.4 F (36.9 C) 98.6 F (37 C)    TempSrc: Oral     SpO2: 95% 96% 99% 94%  Weight:      Height:       Weight change:   Intake/Output Summary (Last 24 hours) at 12/06/2017 1013 Last data filed at 12/06/2017 0823 Gross per 24 hour  Intake 500 ml  Output 50 ml  Net 450 ml   General appearance: Sitting on PACU bed, visibly in pain. Heart: RRR Extremities: Wound vac in place on RLE  Medications: Scheduled Meds: . [MAR Hold] amLODipine  5 mg Oral QPM  . [MAR Hold] darbepoetin (ARANESP) injection - DIALYSIS  200 mcg Intravenous Q Sat-HD  . [MAR Hold] doxercalciferol  5 mcg Intravenous Q T,Th,Sa-HD  . [MAR Hold] fosamprenavir  700 mg Oral BID  . [MAR Hold] insulin aspart  0-20 Units Subcutaneous TID WC  . [MAR Hold] insulin glargine  10 Units Subcutaneous QHS  . [MAR Hold] lisinopril  40 mg Oral Daily  . [MAR Hold] metoprolol tartrate  25 mg Oral BID  . povidone-iodine  2 application Topical Once  . [MAR Hold] raltegravir  400 mg Oral BID  . [MAR Hold] ritonavir  100 mg Oral Q breakfast   Continuous Infusions: . [MAR Hold] sodium chloride    . [MAR Hold] sodium chloride    . [MAR Hold] piperacillin-tazobactam (ZOSYN)  IV Stopped (12/06/17 0327)  . [MAR Hold] vancomycin     PRN Meds:.[MAR Hold] sodium chloride, [MAR Hold] sodium chloride, [MAR Hold] acetaminophen **OR** [MAR Hold] acetaminophen, HYDROmorphone (DILAUDID) injection, [MAR Hold] lidocaine (PF), [MAR Hold] lidocaine-prilocaine, [MAR Hold] lip balm, [MAR Hold] pentafluoroprop-tetrafluoroeth, promethazine, [MAR Hold] senna-docusate   Assessment/Plan: Active  Problems:   Subacute osteomyelitis, right ankle and foot ([place])   Wound infection   Cutaneous abscess of right foot  Mr. Vane is a 51 year old male with PMH significant for ESRD, T2DM, HIV, CHF, and s/p left BKA who presented with worsening right foot ulcers that were consistent with osteomyelitis and tenosynovitis on imaging. He is now s/p transtibial amputation of RLE.   S/p transtibial amputation of RLE 2/2 osteomyelitis MRI with osteomyelitis of calcaneous and distal fibula with septic vs aseptic tenosynovitis. Underwent transtibial amputation with ortho on 12/2 with no complications. Plan to continue antibiotics for 24 hours post-op. Plan to discharge to SNF and follow up in one week with ortho.  - Wound vac in place for 1 week - Follow up blood cultures x 2 - PT/OT consult  ?Afib/Aflutter and elevated PT/INR 34.5/3.46 on admission and 35.3/3.56 on recheck this morning. Spoke with ortho overnight, recommended 2.5mg  Vitamin K. - Holding home warfarin - Recheck PT/INR - Continue on telemetry  ESRD Hemodialysis Tu/Th/Sat and will go today after he leaves the PACU. Creatinine 8.65 today. - BMP tomorrow  T2DM Per patient, BSG runs in the 300s at home. Has been in the 200-300s since admission.  - Lantus 10 units subcutaneous QHS - SSI and CBG  CHF, HTN Continue home medication regimen. - Lisinopril 40mg  PO QD - Amlodipine 5mg  PO QHS - Metoprolol  tartrate 25mg  PO BID  HIV Continue home medication regimen. - Ritonavir 100mg  QD - Raltegravir 400mg  BID - Fosamprenavir 700mg  BID  This is a Careers information officer Note.  The care of the patient was discussed with Dr. Ronalee Red and the assessment and plan formulated with their assistance.  Please see their attached note for official documentation of the daily encounter.   LOS: 1 day   Louann Liv, Medical Student 12/06/2017, 10:13 AM

## 2017-12-06 NOTE — Progress Notes (Signed)
   Subjective:  Thomas [name] was seen in the PACU after transtibial amputation of right lower extremity. He reports pain in his RLE but denies chest pain or shortness of breath.  Objective:  Vital signs in last 24 hours: Vitals:   12/06/17 0957 12/06/17 0958 12/06/17 1005 12/06/17 1008  BP:   111/69 108/68  Pulse:   (!) 117 (!) 123  Resp:    16  Temp:    98.6 F (37 C)  TempSrc:      SpO2: 95% 95% 94% 97%  Weight:      Height:       GEN: Obese male lying in bed. Intermittently wincing 2/2 pain. Alert. RESP: Clear to auscultation bilaterally. No wheezes, rales, or rhonchi. No increased work of breathing. CV: Tachycardic and regular rhythm. No murmurs, gallops, or rubs. No LE edema. ABD: Soft. Non-tender. Non-distended. Normoactive bowel sounds. EXT: No edema. Warm. S/p R BKA with wound vac in place as well as L BKA. NEURO: Cranial nerves II-XII grossly intact. Able to lift all four extremities against gravity. No apparent audiovisual hallucinations. Speech fluent and appropriate. PSYCH: Patient is calm and pleasant. Appropriate affect. Well-groomed; speech is appropriate and on-subject.  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  Osteomyelitis of right ankle and foot S/p transtibial amputation on 12/8 with Ortho. Has Wound VAC in place. Afebrile and WBC improved to 11.8. On Vanc/Zosyn. - Orthopedics following; appreciate their assistance - Will continue Vanc/Zosyn x24h post-op - Non-weight bearing on right - Wound VAC x1 week - Oxy IR 5-10mg  q3h PRN for pain control - F/u with Ortho outpt in 1 week - F/u BCx - CBC in AM -  PT/OT consult  ESRD with HD TuThSa Last dialysis session yesterday. BUN 41, Cr 8.65. K 4.1. - Nephrology following, appreciate their assistance - HD today - Renal function panel in AM  DM2 Home regimen includes 30u Lantus QHS and SSI. - CBG monitoring - Lantus 10u QHS and SSI  HTN, CHF, ?afib/flutter Home BP regimen includes amlodipine 5mg  qday, lisinopril 40mg  qday, and metoprolol 25mg  BID. On warfarin for anticoagulation. CHADS2-VASc score of at least 3, which gives him 4.6% annual risk of stroke. INR was 3.56 today. Given Vitamin K 2.5mg . - Telemetry - Continue home amlodipine, lisinopril, and metoprolol - Hold home warfarin - F/u INR  HIV Last viral load 30 in March 2018, CD4 count 310 in May 2018. - Continue home ritonavir, fosamprenavir, and raltegravir  Dispo: Anticipated discharge in approximately 2-3 day(s).   Thomas Ewing, MD 12/06/2017, 11:07 AM Pager: Mamie Nick [phone]

## 2017-12-06 NOTE — Progress Notes (Signed)
Patient returned from surgery. Patients vital signs were obtained and patient denies no pain. Patient complaining of itching. MD notified. Orders placed. Will continue to monitor.

## 2017-12-06 NOTE — Progress Notes (Signed)
Orthopedic Tech Progress Note Patient Details:  Thomas [name] [birth year] [id number]  Patient ID: Thomas [name], male   DOB: 1966/09/30, 51 y.o.   MRN: [id number]   Thomas [name] 12/06/2017, 11:01 AM Called in [facility] brace order; spoke with answering service

## 2017-12-07 ENCOUNTER — Encounter (HOSPITAL_COMMUNITY): Payer: Self-pay | Admitting: Orthopedic Surgery

## 2017-12-07 LAB — GLUCOSE, CAPILLARY
GLUCOSE-CAPILLARY: 201 mg/dL — AB (ref 65–99)
Glucose-Capillary: 205 mg/dL — ABNORMAL HIGH (ref 65–99)
Glucose-Capillary: 98 mg/dL (ref 65–99)
Glucose-Capillary: 148 mg/dL — ABNORMAL HIGH (ref 65–99)

## 2017-12-07 LAB — PROTIME-INR
INR: 1.74
Prothrombin Time: 20.2 seconds — ABNORMAL HIGH (ref 11.4–15.2)

## 2017-12-07 MED ORDER — INSULIN GLARGINE 100 UNIT/ML ~~LOC~~ SOLN
20.0000 [IU] | Freq: Every day | SUBCUTANEOUS | Status: DC
  Administered 2017-12-07 – 2017-12-08 (×2): 20 [IU] via SUBCUTANEOUS
  Filled 2017-12-07 (×2): qty 0.2

## 2017-12-07 MED ORDER — HYDROXYZINE HCL 10 MG PO TABS
10.0000 mg | ORAL_TABLET | Freq: Once | ORAL | Status: AC
Start: 2017-12-07 — End: 2017-12-07
  Administered 2017-12-07: 10 mg via ORAL
  Filled 2017-12-07: qty 1

## 2017-12-07 MED ORDER — WARFARIN SODIUM 7.5 MG PO TABS
7.5000 mg | ORAL_TABLET | Freq: Once | ORAL | Status: AC
  Administered 2017-12-07: 7.5 mg via ORAL
  Filled 2017-12-07: qty 1

## 2017-12-07 MED ORDER — HYDROMORPHONE HCL 1 MG/ML IJ SOLN: 1.0000 mg | INTRAMUSCULAR | Status: DC | PRN

## 2017-12-07 MED ORDER — HYDROMORPHONE HCL 1 MG/ML IJ SOLN
1.0000 mg | INTRAMUSCULAR | Status: DC | PRN
  Administered 2017-12-08: 1 mg via INTRAVENOUS
  Filled 2017-12-07: qty 1

## 2017-12-07 MED ORDER — PIPERACILLIN-TAZOBACTAM 3.375 G IVPB
3.3750 g | Freq: Two times a day (BID) | INTRAVENOUS | Status: AC
  Administered 2017-12-07 – 2017-12-08 (×2): 3.375 g via INTRAVENOUS
  Filled 2017-12-07 (×2): qty 50

## 2017-12-07 MED ORDER — INSULIN ASPART 100 UNIT/ML ~~LOC~~ SOLN
3.0000 [IU] | Freq: Three times a day (TID) | SUBCUTANEOUS | Status: DC
  Administered 2017-12-07 – 2017-12-08 (×5): 3 [IU] via SUBCUTANEOUS

## 2017-12-07 MED ORDER — WARFARIN - PHARMACIST DOSING INPATIENT
Freq: Every day | Status: DC
  Administered 2017-12-09 – 2017-12-11 (×2)

## 2017-12-07 NOTE — Progress Notes (Signed)
OT Cancellation Note  Patient Details Name: Thomas [name] MRN: [id number] DOB: 07/29/1966   Cancelled Treatment:    Reason Eval/Treat Not Completed: Pain limiting ability to participate. Pt reporting pain 10/10 RLE even after recent pain medication. Patient politely, but firmly refused. Plan to reattempt tomorrow as able.    Hortencia Pilar 12/07/2017, 11:10 AM

## 2017-12-07 NOTE — Progress Notes (Signed)
Pharmacy Antibiotic Note Thomas [name] is a 51 y.o. male admitted on 12/05/2017 with wound infection. Went for amputation with Dr. Sharol Given on 12/8- he had recommended 24h of antibiotic coverage post-op, IMTS wanted to cover for 48h.  Patient is ESRD on HD- last went on 12/8 (full session)- received vancomycin after HD appropriately. Afebrile. Cxs are ngtd.  Plan: Stop vancomycin as dose given on 12/8 will cover until next HD session on 12/11 and complete 48h duration of therapy post-op Zosyn 3.375 g IV q12h EI- stop after dose on 12/10 AM to finish 48h of therapy  Pharmacy to sign off from consults as no other changes required    Height: 6\' 6"  (198.1 cm) Weight: 249 lb 5.4 oz (113.1 kg) IBW/kg (Calculated) : 91.4  Temp (24hrs), Avg:98.2 F (36.8 C), Min:97.7 F (36.5 C), Max:99.2 F (37.3 C)  Recent Labs  Lab 12/05/17 0953 12/05/17 1147 12/05/17 1322 12/06/17 0419 12/06/17 1204  WBC 14.0*  --   --  11.8*  --   CREATININE 6.71*  --   --  8.65* 9.00*  LATICACIDVEN  --  1.21 1.33  --   --     Estimated Creatinine Clearance: 13.7 mL/min (A) (by C-G formula based on SCr of 9 mg/dL (H)).    No Known Allergies  Antimicrobials this admission: Vanc 12/7>>12/8 (last dose given) Zosyn 12/7>>12/10  Dose adjustments this admission: n/a  Microbiology results: 12/7 BCx x 2: ngtd \ Thomas [name], PharmD, BCPS Clinical Pharmacist Clinical Phone for 12/07/2017 until 3:30pm: [phone] If after 3:30pm, please call main pharmacy at [phone] 12/07/2017 1:53 PM

## 2017-12-07 NOTE — Progress Notes (Signed)
   Subjective:  Evaluated this morning.  Patient states that he is having itching all over his body because he is sweating.  He also reports pain at his amputation site.  He denies shortness of breath or chest pain.  Objective:  Vital signs in last 24 hours: Vitals:   12/06/17 1737 12/06/17 1806 12/06/17 2135 12/07/17 0443  BP:   138/77 137/75  Pulse:   (!) 111 (!) 110  Resp:   16 17  Temp:   97.9 F (36.6 C) 99.2 F (37.3 C)  TempSrc:      SpO2: 93% 98% 95% 92%  Weight:   249 lb 5.4 oz (113.1 kg)   Height:       GEN: Obese male lying in bed. No acute distress. Alert. RESP: Clear to auscultation bilaterally. No wheezes, rales, or rhonchi. No increased work of breathing. CV: Tachycardic and regular rhythm. No murmurs, gallops, or rubs. No LE edema. ABD: Soft. Non-tender. Non-distended. Normoactive bowel sounds. EXT: No edema. Warm. S/p R BKA with wound vac in place as well as L BKA. PSYCH: Patient is calm. Appropriate affect. Well-groomed; speech is appropriate and on-subject.  Assessment/Plan:  Active Problems:   Subacute osteomyelitis, right ankle and foot (HCC)   Wound infection   Cutaneous abscess of right foot  Mr. 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  Osteomyelitis of right ankle and foot S/p transtibial amputation on 12/8 with Ortho. Has Wound VAC in place. Afebrile. On Vanc/Zosyn.  BC NGTD  - Orthopedics following; appreciate their assistance - Will continue Vanc/Zosyn and consider dc 12/10 - Non-weight bearing on right - Wound VAC x1 week - Oxy IR 5-10mg  q3h PRN for pain control - IV dilaudid 1mg  Q2 PRN - F/u with Ortho outpt in 1 week - F/u BCx - CBC in AM - PT/OT consult  Pruritus Patient states his whole body itches.  No rashes noted on the  skin.  Will give benadryl as lotion has not helped.  -benadryl  ESRD with HD TuThSa Had HD 12/8 - Nephrology following, appreciate their assistance  DM2 Home regimen includes 30u Lantus QHS and SSI.  Blood glucose elevated, increasing insulin to home lantus dose and adding meal time coverage - CBG monitoring - Lantus 20u QHS, novolog 3 units w/meals and SSI  HTN, CHF, ?afib/flutter Home BP regimen includes amlodipine 5mg  qday, lisinopril 40mg  qday, and metoprolol 25mg  BID. On warfarin for anticoagulation. CHADS2-VASc score of at least 3, which gives him 4.6% annual risk of stroke. INR was 3.56 yesterday.  INR today pending. - Telemetry - Continue home amlodipine, lisinopril, and metoprolol - Hold home warfarin, can restart once therapeutic - F/u INR  HIV Last viral load 30 in March 2018, CD4 count 310 in May 2018. - Continue home ritonavir, fosamprenavir, and raltegravir  Dispo: Anticipated discharge in approximately 2-3 day(s).   Kalman Shan [place], DO 12/07/2017, 9:12 AM Pager: Mamie Nick [phone]

## 2017-12-07 NOTE — Progress Notes (Signed)
Patient continues to complain of itching despite current PRN order implementation. MD notified. MD made aware. Will continue to monitor.

## 2017-12-07 NOTE — Progress Notes (Signed)
PT Cancellation Note  Patient Details Name: Thomas [name] MRN: [id number] DOB: 1966/03/09   Cancelled Treatment:    Reason Eval/Treat Not Completed: Pain limiting ability to participate  Pt reporting pain 10/10 RLE even after recent pain medication. Patient politely, but firmly refused PT (even bed exercises). Educated on proper elevation to reduce edema and pt appreciative.  Will reattempt 12/08/17  Rexanne Mano, PT 12/07/2017, 11:07 AM

## 2017-12-07 NOTE — Progress Notes (Signed)
ANTICOAGULATION CONSULT NOTE - Initial Consult  Pharmacy Consult for warfarin Indication: atrial fibrillation  No Known Allergies  Patient Measurements: Height: 6\' 6"  (198.1 cm) Weight: 249 lb 5.4 oz (113.1 kg) IBW/kg (Calculated) : 91.4  Vital Signs: Temp: 99.2 F (37.3 C) (12/09 0443) BP: 130/76 (12/09 0943) Pulse Rate: 100 (12/09 0943)  Labs: Recent Labs    12/05/17 0953 12/05/17 1918 12/06/17 0419 12/06/17 1204 12/07/17 1518  HGB 8.4*  --  7.6*  --   --   HCT 26.6*  --  23.8*  --   --   PLT 314  --  309  --   --   LABPROT  --  34.5* 35.3*  --  20.2*  INR  --  3.46 3.56  --  1.74  CREATININE 6.71*  --  8.65* 9.00*  --     Estimated Creatinine Clearance: 13.7 mL/min (A) (by C-G formula based on SCr of 9 mg/dL (H)).   Assessment: 42 YOM with AFib who was admitted with a supratherapeutic INR. INR was 3.46 on 12/7- patient received vitamin K 2.5mg  PO and INR on 12/8 was 3.56.  INR this afternoon fell to 1.74. Hgb 7.6, plts 309 yesterday No bleeding noted.  Spoke with IMTS and they are ok with resuming warfarin.  Goal of Therapy:  INR 2-3 Monitor platelets by anticoagulation protocol: Yes   Plan:  Warfarin 7.5mg  po x1 tonight  Daily INR Follow s/s bleeding   Camdyn Beske D. Eldor Conaway, PharmD, BCPS Clinical Pharmacist Clinical Phone for 12/07/2017 until 3:30pm: [phone] If after 3:30pm, please call main pharmacy at [phone] 12/07/2017 4:10 PM

## 2017-12-07 NOTE — Plan of Care (Signed)
  Pain Managment: General experience of comfort will improve 12/07/2017 2057 - Progressing by Irish Lack, RN 12/07/2017 2056 - Progressing by Irish Lack, RN   Clinical Measurements: Ability to maintain clinical measurements within normal limits will improve 12/07/2017 2057 - Progressing by Irish Lack, RN

## 2017-12-07 NOTE — Progress Notes (Signed)
[facility] Progress Note  Subjective: lying flat in bed, no SOB or CP, pain under control  Vitals:   12/06/17 1806 12/06/17 2135 12/07/17 0443 12/07/17 0943  BP:  138/77 137/75 130/76  Pulse:  (!) 111 (!) 110 100  Resp:  16 17   Temp:  97.9 F (36.6 C) 99.2 F (37.3 C)   TempSrc:      SpO2: 98% 95% 92%   Weight:  113.1 kg (249 lb 5.4 oz)    Height:        Inpatient medications: . amLODipine  5 mg Oral QPM  . darbepoetin (ARANESP) injection - DIALYSIS  200 mcg Intravenous Q Sat-HD  . docusate sodium  100 mg Oral BID  . doxercalciferol  5 mcg Intravenous Q T,Th,Sa-HD  . fosamprenavir  700 mg Oral BID  . insulin aspart  0-20 Units Subcutaneous TID WC  . insulin aspart  3 Units Subcutaneous TID WC  . insulin glargine  20 Units Subcutaneous QHS  . lisinopril  40 mg Oral Daily  . metoprolol tartrate  25 mg Oral BID  . raltegravir  400 mg Oral BID  . ritonavir  100 mg Oral Q breakfast   . sodium chloride    . sodium chloride    . 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 ESRD usual HD TTS 3. Vol - should lose 2-3kg after leg amp 4. HTN - on home meds, BP's good, under dry wt 5. Anemia of CKD - on esa and weekly venofer, holding Fe w infection 6. MBD - hect w HD 7. HIV - on meds  Plan - HD tuesday   [name] MD [place] pager [phone]   12/07/2017, 1:52 PM   Recent Labs  Lab 12/05/17 0953 12/06/17 0419 12/06/17 1204  NA 132* 132* 130*  K 3.9 4.1 3.7  CL 94* 92* 94*  CO2 24 25 22   GLUCOSE 315* 277* 229*  BUN 30* 41* 43*  CREATININE 6.71* 8.65* 9.00*  CALCIUM 8.6* 8.2* 7.9*  PHOS  --   --  2.9   Recent Labs  Lab 12/05/17 0953 12/06/17 1204  AST 15  --   ALT 14*  --   ALKPHOS 102  --   BILITOT 0.9  --   PROT 7.7  --   ALBUMIN 2.6* 2.3*   Recent Labs  Lab 12/05/17 0953 12/06/17 0419  WBC 14.0* 11.8*  NEUTROABS 11.1*  --   HGB 8.4* 7.6*  HCT 26.6* 23.8*  MCV 94.0 92.2  PLT 314 309   Iron/TIBC/Ferritin/ %Sat No results found for: IRON, TIBC, FERRITIN, IRONPCTSAT

## 2017-12-08 DIAGNOSIS — E1152 Type 2 diabetes mellitus with diabetic peripheral angiopathy with gangrene: Secondary | ICD-10-CM

## 2017-12-08 DIAGNOSIS — M86179 Other acute osteomyelitis, unspecified ankle and foot: Secondary | ICD-10-CM

## 2017-12-08 LAB — BASIC METABOLIC PANEL
ANION GAP: 15 (ref 5–15)
BUN: 40 mg/dL — ABNORMAL HIGH (ref 6–20)
CALCIUM: 9.2 mg/dL (ref 8.9–10.3)
CO2: 24 mmol/L (ref 22–32)
Chloride: 97 mmol/L — ABNORMAL LOW (ref 101–111)
Creatinine, Ser: 9.51 mg/dL — ABNORMAL HIGH (ref 0.61–1.24)
GFR calc Af Amer: 6 mL/min — ABNORMAL LOW (ref >60)
GFR, EST NON AFRICAN AMERICAN: 6 mL/min — AB (ref >60)
GLUCOSE: 151 mg/dL — AB (ref 65–99)
Potassium: 4.7 mmol/L (ref 3.5–5.1)
Sodium: 136 mmol/L (ref 135–145)

## 2017-12-08 LAB — HEMOGLOBIN AND HEMATOCRIT, BLOOD
HEMATOCRIT: 23.3 % — AB (ref 39.0–52.0)
HEMOGLOBIN: 7.3 g/dL — AB (ref 13.0–17.0)

## 2017-12-08 LAB — CBC
HEMATOCRIT: 22.1 % — AB (ref 39.0–52.0)
Hemoglobin: 6.8 g/dL — CL (ref 13.0–17.0)
MCH: 28.8 pg (ref 26.0–34.0)
MCHC: 30.8 g/dL (ref 30.0–36.0)
MCV: 93.6 fL (ref 78.0–100.0)
PLATELETS: 316 10*3/uL (ref 150–400)
RBC: 2.36 MIL/uL — ABNORMAL LOW (ref 4.22–5.81)
RDW: 16.8 % — AB (ref 11.5–15.5)
WBC: 13.5 10*3/uL — AB (ref 4.0–10.5)

## 2017-12-08 LAB — GLUCOSE, CAPILLARY
GLUCOSE-CAPILLARY: 304 mg/dL — AB (ref 65–99)
GLUCOSE-CAPILLARY: 197 mg/dL — AB (ref 65–99)
Glucose-Capillary: 233 mg/dL — ABNORMAL HIGH (ref 65–99)
Glucose-Capillary: 198 mg/dL — ABNORMAL HIGH (ref 65–99)

## 2017-12-08 LAB — PROTIME-INR
INR: 1.84
Prothrombin Time: 21.1 seconds — ABNORMAL HIGH (ref 11.4–15.2)

## 2017-12-08 LAB — PREPARE RBC (CROSSMATCH)

## 2017-12-08 MED ORDER — SODIUM CHLORIDE 0.9 % IV SOLN
125.0000 mg | INTRAVENOUS | Status: DC
  Administered 2017-12-11: 125 mg via INTRAVENOUS
  Filled 2017-12-08: qty 10

## 2017-12-08 MED ORDER — WARFARIN SODIUM 7.5 MG PO TABS: 7.5000 mg | ORAL_TABLET | Freq: Once | ORAL | Status: DC

## 2017-12-08 MED ORDER — SODIUM CHLORIDE 0.9 % IV SOLN
Freq: Once | INTRAVENOUS | Status: AC
  Administered 2017-12-08: 08:00:00 via INTRAVENOUS

## 2017-12-08 MED ORDER — SORBITOL 70 % SOLN: 30.0000 mL | Freq: Every day | Status: DC | PRN

## 2017-12-08 MED ORDER — RENA-VITE PO TABS
1.0000 | ORAL_TABLET | Freq: Every day | ORAL | Status: DC
  Administered 2017-12-08 – 2017-12-10 (×3): 1 via ORAL
  Filled 2017-12-08 (×3): qty 1

## 2017-12-08 MED ORDER — NEPRO/CARBSTEADY PO LIQD
237.0000 mL | Freq: Two times a day (BID) | ORAL | Status: DC
  Administered 2017-12-08 – 2017-12-11 (×3): 237 mL via ORAL
  Filled 2017-12-08 (×9): qty 237

## 2017-12-08 NOTE — Progress Notes (Signed)
  Date: 12/08/2017  Patient name: Thomas [name]  Medical record number: [id number]  Date of birth: 03/18/1966   I have seen and evaluated this patient and I have discussed the plan of care with the house staff. Please see Dr. Allayne Gitelman note for complete details. I concur with her findings.    Agree with holding coumadin.  Unknown arrhythmia for which he was on coumadin, presumably Afib/flutter, but could be SVT per Cardiology notes.  Notes from previous physicians in [place] were apparently reviewed by Cardiology team, but I cannot see them currently (sent to medical records.).  Given possible bleeding will hold coumadin for now and re-assess Hgb after blood transfusion.    Sid Falcon, MD 12/08/2017, 1:20 PM

## 2017-12-08 NOTE — Progress Notes (Signed)
Rehab Admissions Coordinator Note:  Patient was screened by Cleatrice Burke for appropriateness for an Inpatient Acute Rehab Consult.  At this time, we are recommending Inpatient Rehab consult.  Cleatrice Burke 12/08/2017, 1:45 PM  I can be reached at [phone].

## 2017-12-08 NOTE — Evaluation (Signed)
Physical Therapy Evaluation Patient Details Name: Thomas [name] MRN: [id number] DOB: 1966-06-07 Today's Date: 12/08/2017   History of Present Illness  Thomas [name] is a 51 y.o. male who presents with purulent draining abscess from the right calcaneus.  Patient has diabetic insensate neuropathy end-stage renal disease on dialysis, HIV positive, LBKA. now s/p RBKA on 12/8. Has Wound VAC in place  Clinical Impression  Pt is tolerating some minimal efforts with PT now, able to wear LLE prosthesis and did some minor scooting on side of bed.  Between his very low Hgb and meds for pain, he is not feeling up to trying to stand with PT. Will progress this tomorrow as he is able, with 2 person assist to get there.  Follow acutely and recommend CIR to accelerate his progress toward safe mobility and beginning walking to prepare for prosthetic fitting on RLE.    Follow Up Recommendations CIR    Equipment Recommendations  None recommended by PT    Recommendations for Other Services Rehab consult     Precautions / Restrictions Precautions Precautions: Fall Precaution Comments: wound vac RLE Required Braces or Orthoses: Other Brace/Splint Other Brace/Splint: LLE prothestic Restrictions Weight Bearing Restrictions: Yes RLE Weight Bearing: Non weight bearing      Mobility  Bed Mobility Overal bed mobility: Needs Assistance Bed Mobility: Supine to Sit;Sit to Supine     Supine to sit: Mod assist Sit to supine: Mod assist   General bed mobility comments: help back to bed with RLE but assisted with LLE as well to ease pain with the effort  Transfers Overall transfer level: Needs assistance Equipment used: 1 person hand held assist Transfers: Lateral/Scoot Transfers          Lateral/Scoot Transfers: Min assist General transfer comment: declined to fully stand but can clear hips on bedside to scoot to [facility]  Ambulation/Gait             General Gait Details: not attempted due to pain  complaints  Stairs            Wheelchair Mobility    Modified Rankin (Stroke Patients Only)       Balance Overall balance assessment: Needs assistance Sitting-balance support: Single extremity supported;Bilateral upper extremity supported Sitting balance-Leahy Scale: Fair   Postural control: Posterior lean Standing balance support: Bilateral upper extremity supported   Standing balance comment: unable to assist himself due to pain                             Pertinent Vitals/Pain Pain Assessment: 0-10 Pain Score: 8  Pain Location: R leg with any movement Pain Descriptors / Indicators: Throbbing Pain Intervention(s): Limited activity within patient's tolerance;Monitored during session;Premedicated before session;Repositioned(groggy from meds)    Home Living   Living Arrangements: Spouse/significant other;Other relatives Available Help at Discharge: Family Type of Home: House Home Access: Ramped entrance     Home Layout: One level   Additional Comments: Pt is 6'6" tall, previously up with no AD    Prior Function Level of Independence: Independent with assistive device(s)         Comments: LLE prosthesis     Hand Dominance   Dominant Hand: Right    Extremity/Trunk Assessment   Upper Extremity Assessment Upper Extremity Assessment: Overall WFL for tasks assessed    Lower Extremity Assessment Lower Extremity Assessment: Generalized weakness    Cervical / Trunk Assessment Cervical / Trunk Assessment: Normal  Communication  Communication: No difficulties  Cognition Arousal/Alertness: Lethargic;Suspect due to medications Behavior During Therapy: Anxious(about causing pain on RLE) Overall Cognitive Status: Within Functional Limits for tasks assessed                                        General Comments      Exercises     Assessment/Plan    PT Assessment Patient needs continued PT services  PT Problem List  Decreased strength;Decreased range of motion;Decreased activity tolerance;Decreased balance;Decreased mobility;Decreased coordination;Decreased knowledge of use of DME;Decreased safety awareness;Decreased knowledge of precautions;Decreased skin integrity;Pain       PT Treatment Interventions DME instruction;Gait training;Functional mobility training;Therapeutic activities;Therapeutic exercise;Balance training;Neuromuscular re-education;Patient/family education    PT Goals (Current goals can be found in the Care Plan section)  Acute Rehab PT Goals Patient Stated Goal: to go to rehab and then home PT Goal Formulation: With patient Time For Goal Achievement: 12/22/17 Potential to Achieve Goals: Good    Frequency Min 3X/week   Barriers to discharge Other (comment) will need 2 person assist to transfer and does not have this at home    Co-evaluation               AM-PAC PT "6 Clicks" Daily Activity  Outcome Measure Difficulty turning over in bed (including adjusting bedclothes, sheets and blankets)?: A Lot Difficulty moving from lying on back to sitting on the side of the bed? : Unable Difficulty sitting down on and standing up from a chair with arms (e.g., wheelchair, bedside commode, etc,.)?: Unable Help needed moving to and from a bed to chair (including a wheelchair)?: A Lot Help needed walking in hospital room?: Total Help needed climbing 3-5 steps with a railing? : Total 6 Click Score: 8    End of Session   Activity Tolerance: Patient limited by pain;Patient limited by lethargy Patient left: in bed;with call bell/phone within reach Nurse Communication: Mobility status PT Visit Diagnosis: Unsteadiness on feet (R26.81);Muscle weakness (generalized) (M62.81);Pain;Difficulty in walking, not elsewhere classified (R26.2) Pain - Right/Left: Right Pain - part of body: Leg    Time: 1414-1435 PT Time Calculation (min) (ACUTE ONLY): 21 min   Charges:   PT Evaluation $PT Eval  Moderate Complexity: 1 Mod     PT G Codes:   PT G-Codes **NOT FOR INPATIENT CLASS** Functional Assessment Tool Used: AM-PAC 6 Clicks Basic Mobility    Ramond Dial 12/08/2017, 3:17 PM   Mee Hives, PT MS Acute Rehab Dept. Number: [place] and [place]

## 2017-12-08 NOTE — Care Management Note (Signed)
Case Management Note  Patient Details  Name: Thomas [name] MRN: [id number] Date of Birth: 27-Jul-1966  Subjective/Objective:      CM following for progression and d/c planning..               Action/Plan: 12/08/2017 Pt with significant pain. Noted CIR following  As recommended by PT eval. Hopefully CIR will be able to provide rehab for this pt .  Expected Discharge Date:                  Expected Discharge Plan:     In-House Referral:  Clinical Social Work  Discharge planning Services  CM Consult  Post Acute Care Choice:    Choice offered to:     DME Arranged:    DME Agency:     HH Arranged:    HH Agency:     Status of Service:  In process, will continue to follow  If discussed at Long Length of Stay Meetings, dates discussed:    Additional Comments:  Adron Bene, RN 12/08/2017, 2:38 PM

## 2017-12-08 NOTE — Progress Notes (Addendum)
ANTICOAGULATION CONSULT NOTE - Initial Consult  Pharmacy Consult for warfarin Indication: atrial fibrillation  No Known Allergies  Patient Measurements: Height: 6\' 6"  (198.1 cm) Weight: 249 lb 5.4 oz (113.1 kg) IBW/kg (Calculated) : 91.4  Vital Signs: Temp: 98.7 F (37.1 C) (12/10 0809) Temp Source: Oral (12/10 0809) BP: 126/71 (12/10 0809) Pulse Rate: 108 (12/10 0809)  Labs: Recent Labs    12/05/17 0953  12/06/17 0419 12/06/17 1204 12/07/17 1518 12/08/17 0531  HGB 8.4*  --  7.6*  --   --  6.8*  HCT 26.6*  --  23.8*  --   --  22.1*  PLT 314  --  309  --   --  316  LABPROT  --    < > 35.3*  --  20.2* 21.1*  INR  --    < > 3.56  --  1.74 1.84  CREATININE 6.71*  --  8.65* 9.00*  --  9.51*   < > = values in this interval not displayed.    Estimated Creatinine Clearance: 13 mL/min (A) (by C-G formula based on SCr of 9.51 mg/dL (H)).   Assessment: 80 YOM with AFib who was admitted with a supratherapeutic INR. INR was 3.46 on 12/7- patient received vitamin K 2.5mg  PO to reverse INR to <2 for surgery on 12/8. Warfarin as restarted on 12/9.  INR today remains SUBtherapeutic though trending up (INR 1.84 << 1.74, goal of 2-3). Hgb 6.8 << 7.6 - appears MD planning to give 1 unit PRBC today. No active bleeding noted per RN report - could be residual blood loss from recent amputation.  Discussed with IM Ronalee Red) who wants to hold warfarin dosing today until further evaluation for bleeding can be made.    Goal of Therapy:  INR 2-3 Monitor platelets by anticoagulation protocol: Yes   Plan:  1. Hold Warfarin dose today 2. Will continue to monitor for any signs/symptoms of bleeding and will follow up with PT/INR in the a.m.  Thank you for allowing pharmacy to be a part of this patient's care.  Alycia Rossetti, PharmD, BCPS Clinical Pharmacist Pager: [phone] Clinical phone for 12/08/2017 from 7a-3:30p: [phone] If after 3:30p, please call main pharmacy at: [phone] 12/08/2017  9:18 AM

## 2017-12-08 NOTE — Progress Notes (Signed)
Pt A&OX3, breathing equal and unlabored. This morning temp=101.4. BP=115/66. HR=113. POX=90% on 3L. Pt has right posterior base crackles. Pt was given tylenol 650mg  PO. Pt was educated and encouraged to use the incentive spirometry.  PT HGB=6.8. Dr. Johny Chess was made aware of above details. No new orders given.

## 2017-12-08 NOTE — Progress Notes (Signed)
CRITICAL VALUE ALERT  Critical Value:  HGB=6.8  Date & Time Notied:  12-08-17/ 0649  Provider Notified: Dr. Dessa Phi.  Orders Received/Actions taken: No new orders. Tylenol 650mg  PO given.

## 2017-12-08 NOTE — Progress Notes (Addendum)
1300 Bedside shift report from [place], Therapist, sports. Pt resting in bed, c/o pain but denies wanting pain meds at time. Blood done infusing, pt A&Ox4. WCTM.  1400 Pt assessed, see flowsheet. Pt refusing to let RN touch or removed dressing to right BKA amputation. Fall precautions in place. WCTM.   1715 Pt medicated for pain, had 2nd BM of day. Brother at bedside visiting. WCTM.   1850 Pt resting in bed visiting with family. NAD, no complaints. Awaiting RN for shift report.

## 2017-12-08 NOTE — Progress Notes (Signed)
Inpatient Diabetes Program Recommendations  AACE/ADA: New Consensus Statement on Inpatient Glycemic Control (2015)  Target Ranges:  Prepandial:   less than 140 mg/dL      Peak postprandial:   less than 180 mg/dL (1-2 hours)      Critically ill patients:  140 - 180 mg/dL   Lab Results  Component Value Date   GLUCAP 233 (H) 12/08/2017   HGBA1C 7.7 (H) 09/24/2017    Review of Glycemic Control Results for Thomas [name], Thomas [name] (MRN [id number]) as of 12/08/2017 13:23  Ref. Range 12/07/2017 12:05 12/07/2017 16:33 12/07/2017 21:21 12/08/2017 07:23 12/08/2017 11:55  Glucose-Capillary Latest Ref Range: 65 - 99 mg/dL 205 (H) 98 148 (H) 304 (H) 233 (H)   Diabetes history: DM2 Outpatient Diabetes medications: Lantus 30 units qd + Novolog correction Current orders for Inpatient glycemic control: Lantus 20 units qd + Novolog 3 units tid meal coverage + Novolog resistant correction  Inpatient Diabetes Program Recommendations:   -Increase Lantus to 25 units qd -Increase Novolog to 4 units tid if eats 50% -Decrease Novolog correction to moderate tid  Thank you, Nani Gasser. Carmelia Tiner, RN, MSN, CDE  Diabetes Coordinator Inpatient Glycemic Control Team Team Pager [phone] (8am-5pm) 12/08/2017 1:25 PM

## 2017-12-08 NOTE — Progress Notes (Signed)
Records received from [facility] and sent to Medical Records.  Rosaria Ferries, PA-C 12/08/2017 8:23 AM Beeper [phone]

## 2017-12-08 NOTE — Progress Notes (Signed)
PT Cancellation Note  Patient Details Name: Thomas [name] MRN: [id number] DOB: 1966/03/25   Cancelled Treatment:    Reason Eval/Treat Not Completed: Medical issues which prohibited therapy.  Receiving blood with very low Hgb prior to the transfusion.  Will wait to finish as pt is new BK with prosthetic on other leg, may be unsteady enough with current issues.  See later today.   Ramond Dial 12/08/2017, 11:14 AM   Mee Hives, PT MS Acute Rehab Dept. Number: [place] and [place]

## 2017-12-08 NOTE — Progress Notes (Addendum)
Subjective:  Thomas [name] was seen lying comfortably in bed this morning. He reports 5/10 pain in his RLE s/p amputation. Also reports phantom pain in his RLE. He states that the pain medication is helpful but it makes him very drowsy/sleepy. He denies chest pain, shortness of breath, cough, subjective fevers, changes in BMs, abdominal pain. He denies blood in his stool and states that he does not make urine. Unable to work with PT/OT yesterday 2/2 pain, however he states he is willing to try today.  Objective:  Vital signs in last 24 hours: Vitals:   12/07/17 1724 12/07/17 2122 12/08/17 0616 12/08/17 0809  BP: 133/74 131/80 115/66 126/71  Pulse: 97 (!) 110 (!) 113 (!) 108  Resp: 18 18 18 18   Temp: 98.4 F (36.9 C) 99.3 F (37.4 C) (!) 101.4 F (38.6 C) 98.7 F (37.1 C)  TempSrc: Oral Oral Oral Oral  SpO2: 95% 95% 90% 90%  Weight:  249 lb 5.4 oz (113.1 kg)    Height:       GEN: Obese, pleasant male lying in bed. Alert and oriented. RESP: Clear to auscultation bilaterally. No wheezes, rales, or rhonchi. No increased work of breathing. CV: Regular rate and regular rhythm. No murmurs, gallops, or rubs. No LE edema. ABD: Soft. Non-tender. Non-distended. Normoactive bowel sounds. EXT: Warm. S/p R BKA with wound vac and compressive sleeve in place as well as L BKA. NEURO: Cranial nerves II-XII grossly intact. Able to lift all four extremities against gravity. No apparent audiovisual hallucinations. Speech fluent and appropriate. PSYCH: Patient is calm and pleasant. Appropriate affect. Well-groomed; speech is appropriate and on-subject.  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  Osteomyelitis of right ankle and foot S/p transtibial amputation on 12/8 with Ortho. Has Wound VAC in place. On Vanc/Zosyn. Spiked fever to 101.4 this morning. Reports 5/10 pain in RLE that is improved with PO pain medication. - Orthopedics following; appreciate their assistance - Will continue Vanc/Zosyn x24h post-op - Non-weight bearing on right - Wound VAC x1 week - Oxy IR 5-10mg  q3h PRN for pain control - F/u with Ortho outpt in 1 week - F/u BCx - Continue to monitor fever curve - CBC in AM - PT/OT following; appreciate their assistance - Incentive spirometer  Fever to 101.4 This morning. Differential includes post-op infection at site, respiratory/urinary tract infection, vs medication-induced. Patient has been on Vanc/Zosyn pre- and post-op. Has not had dialysis since Saturday, so this should still be in his system per pharmacy. Patient denies infectious symptoms consistent with PNA, UTI, or abdominal infection. BCx NGTD x2d. Will continue to monitor and consider further evaluation if patient develops another fever. - Continue vanc/zosyn - Monitor fever curve - Will check CD4 count given hx of HIV  Acute blood loss anemia Hb 6.8 this morning. INR 1.84. Patient denies rectal bleeding and nurse tech did not note any blood in his BM this morning. - Hold warfarin in setting of post-op anemia - Transfuse 1u pRBC - Will also monitor for volume overload  ESRD with HD TuThSa HD tomorrow. BUN 40, Cr 9.51, K 4.7 - Nephrology following, appreciate their assistance -  HD tomorrow - Renal function panel in AM  DM2 Home regimen includes 30u Lantus QHS and SSI. - CBG monitoring - Lantus 10u QHS and SSI  HTN, CHF, ?afib/flutter Home BP regimen includes amlodipine 5mg  qday, lisinopril 40mg  qday, and metoprolol 25mg  BID. On warfarin for anticoagulation. CHADS2-VASc score of at least 3, which gives him 4.6% annual risk of stroke. INR 1.84  today, however patient also anemic to 6.8 - Telemetry - Continue home amlodipine, lisinopril, and metoprolol - Hold home warfarin given anemia - INR checks  HIV Last viral load 30 in March 2018, CD4 count 310 in May 2018. - Continue home ritonavir, fosamprenavir, and raltegravir - Check CD4 count  Dispo: Anticipated discharge in approximately 2-3 day(s).  Colbert Ewing, MD 12/08/2017, 9:17 AM Pager: Mamie Nick [phone]

## 2017-12-08 NOTE — Discharge Summary (Signed)
Name: Thomas [name] MRN: [id number] DOB: July 23, 1966 51 y.o. PCP: Patient, No Pcp Per  Date of Admission: 12/05/2017 10:34 AM Date of Discharge: 12/11/2017 Attending Physician: Sid Falcon, MD  Discharge Diagnosis: 1. Osteomyelitis of right ankle and foot  Active Problems:   ESRD on dialysis ([place])   Type 2 diabetes mellitus with complication (HCC)   HIV disease ([place])   Diabetic wet gangrene of the foot ([place] [place])   Subacute osteomyelitis, right ankle and foot ([place])   Wound infection   Cutaneous abscess of right foot   Hypoxia   History of supraventricular tachycardia   Chronic diastolic congestive heart failure (HCC)   Unilateral complete BKA, right, subsequent encounter ([place])   S/P bilateral BKA (below knee amputation) ([place])   Acute blood loss anemia   Anemia of chronic disease   Leukocytosis   Post-operative pain   Discharge Medications: Allergies as of 12/11/2017   No Known Allergies     Medication List    TAKE these medications   amLODipine 5 MG tablet Commonly known as:  NORVASC Take 1 tablet (5 mg total) by mouth daily. What changed:  when to take this   camphor-menthol lotion Commonly known as:  SARNA Apply topically as needed for itching.   cinacalcet 60 MG tablet Commonly known as:  SENSIPAR Take 60 mg by mouth daily.   clindamycin 150 MG capsule Commonly known as:  CLEOCIN Take 1 capsule (150 mg total) by mouth 3 (three) times daily.   doxercalciferol 4 MCG/2ML injection Commonly known as:  HECTOROL Inject 2.5 mLs (5 mcg total) into the vein Every Tuesday,Thursday,and Saturday with dialysis.   ferric gluconate 125 mg in sodium chloride 0.9 % 100 mL Inject 125 mg into the vein Every Tuesday,Thursday,and Saturday with dialysis.   fosamprenavir 700 MG tablet Commonly known as:  LEXIVA Take 700 mg by mouth 2 (two) times daily.   insulin aspart 100 UNIT/ML injection Commonly known as:  novoLOG CBG 70 - 120: 0 units CBG 121 - 150: 3 units CBG  151 - 200: 4 units CBG 201 - 250: 7 units CBG 251 - 300: 11 units CBG 301 - 350: 15 units CBG 351 - 400: 20 units What changed:    how much to take  how to take this  when to take this  additional instructions   insulin glargine 100 UNIT/ML injection Commonly known as:  LANTUS Inject 0.3 mLs (30 Units total) into the skin daily at 10 pm.   lisinopril 40 MG tablet Commonly known as:  PRINIVIL,ZESTRIL Take 40 mg by mouth daily.   metoprolol tartrate 25 MG tablet Commonly known as:  LOPRESSOR Take 1 tablet (25 mg total) by mouth 2 (two) times daily.   Oxycodone HCl 10 MG Tabs Take 1 tablet (10 mg total) by mouth every 3 (three) hours as needed ((score 7 to 10)).   oxyCODONE 5 MG immediate release tablet Commonly known as:  Oxy IR/ROXICODONE Take 1 tablet (5 mg total) by mouth every 3 (three) hours as needed for moderate pain ((score 4 to 6)).   raltegravir 400 MG tablet Commonly known as:  ISENTRESS Take 400 mg by mouth 2 (two) times daily.   ritonavir 100 MG capsule Commonly known as:  NORVIR Take 100 mg by mouth daily with breakfast.   Saline 0.9 % Soln Apply to gauze as part of saline wet to dry dressing   sevelamer carbonate 800 MG tablet Commonly known as:  RENVELA Take 2,400 mg by mouth  3 (three) times daily with meals.   TAB-A-VITE PO Take 1 tablet by mouth daily.   warfarin 10 MG tablet Commonly known as:  COUMADIN Take 10 mg by mouth See admin instructions. Take 10 mg by mouth daily Sunday, Monday, Wednesday and Friday   warfarin 7.5 MG tablet Commonly known as:  COUMADIN Take 7.5 mg by mouth See admin instructions. Take 7.5 mg by mouth daily on Tuesday, Thursday ans Saturday       Disposition and follow-up:   Thomas [name] was discharged from [facility] in Stable condition.  At the hospital follow up visit please address:  1.  - How is the rehab going? Able to get prosthesis? - Is his pain manageable in RLE?  2.  Labs /  imaging needed at time of follow-up: None  3.  Pending labs/ test needing follow-up: None  Follow-up Appointments: Follow-up Information    [name], MD Follow up in 1 week(s).   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 Osteomyelitis of right ankle and foot Patient presented with worsening chronic wound of right foot after outpatient management with Podiatry. MRI with evidence of osteomyelitis. Patient afebrile and VSS initially. Ortho consulted and transtibial amputation of right lower extremity performed on 12/06/2017. On Vanc/Zosyn prior to operation and for 24 hours post-op. Spiked fever to 101.4 after operation, but no fevers since that time. No infectious s/s and BCx negative. Evaluated by PT/OT who recommend CIR. Reported improvement in RLE pain and able to work with PT/OT while inpatient. Discharged to CIR for further rehab.  2. ESRD with HD TuThSa Patient received hemodialysis on usual outpatient schedule while inpatient.  3. Acute on chronic anemia Hb down to 6.8 on morning of 12/10. No rectal bleeding noted. Likely post-op anemia? Warfarin held initially, then restarted after Hb stabilized. Received 1u PRBC 12/10 and another unit with HD on 12/11. Hb stable and at baseline on  discharge.  4. ?Afib/flutter Warfarin held pre-op secondary to supratherapeutic INR and then post-op for 1 day due to acute anemia. CHADS2-VASc score of at least 3, which gives him 4.6% annual risk of stroke. Warfarin restarted after Hb stabilized.  Discharge Vitals:   BP 136/75 (BP Location: Left Arm)   Pulse 92   Temp 98.4 F (36.9 C) (Oral)   Resp 18   Ht 6\' 6"  (1.981 m)   Wt 257 lb 15 oz (117 kg)   SpO2 96%   BMI 29.81 kg/m   Pertinent Labs, Studies, and Procedures:  CBC Latest Ref Rng & Units 12/11/2017 12/10/2017 12/09/2017  WBC 4.0 - 10.5 K/uL 9.9 10.5 12.8(H)  Hemoglobin 13.0 - 17.0 g/dL 8.3(L) 8.4(L) 7.2(L)  Hematocrit 39.0 - 52.0 % 26.5(L) 27.5(L) 23.1(L)  Platelets 150 - 400 K/uL 362 314 289   CMP Latest Ref Rng & Units 12/09/2017 12/08/2017 12/06/2017  Glucose 65 - 99 mg/dL 233(H) 151(H) 229(H)  BUN 6 - 20 mg/dL 52(H) 40(H) 43(H)  Creatinine 0.61 - 1.24 mg/dL 11.41(H) 9.51(H) 9.00(H)  Sodium 135 - 145 mmol/L 134(L) 136 130(L)  Potassium 3.5 - 5.1 mmol/L 4.4 4.7 3.7  Chloride 101 - 111 mmol/L 96(L) 97(L) 94(L)  CO2 22 - 32 mmol/L 22 24 22   Calcium 8.9 - 10.3 mg/dL 8.8(L) 9.2 7.9(L)  Total Protein 6.5 - 8.1 g/dL - - -  Total Bilirubin 0.3 - 1.2 mg/dL - - -  Alkaline Phos 38 - 126 U/L - - -  AST 15 - 41 U/L - - -  ALT 17 - 63 U/L - - -   Lactic acid 1.33 INR 3.46 on admission BCx negative  CXR 12/06/2017 No active disease.  Discharge Instructions: Discharge Instructions    Call MD for:  difficulty breathing, headache or visual disturbances   Complete by:  As directed    Call MD for:  persistant dizziness or light-headedness   Complete by:  As directed    Call MD for:  persistant nausea and vomiting   Complete by:  As directed    Call MD for:  redness, tenderness, or signs of infection (pain, swelling, redness, odor or green/yellow discharge around incision site)   Complete by:  As directed    Call MD for:  severe uncontrolled pain   Complete by:  As  directed    Call MD for:  temperature >100.4   Complete by:  As directed    Diet - low sodium heart healthy   Complete by:  As directed    Increase activity slowly   Complete by:  As directed       Signed: Colbert Ewing, MD 12/11/2017, 11:09 AM   Pager: Mamie Nick [phone]

## 2017-12-08 NOTE — Progress Notes (Signed)
[facility] Progress Note   Dialysis Orders: TTS  [facility] 4h 23min  3k/2.25 bath   123kg    Hep none -mircera 225 q 2wks- last got 150 on 11/29 -hect 5 ug tiw -venofer 100 q wk  Assessment/Plan: 1. S/p right BKA 12/7 -wound VAC in place 2. ESRD - TTS - new edw for d/c HD Tuesday- o nno hpearin HD 3. Anemia -  hgb down to 6.8 - transfusing 1 unit PRBC today - plan another on HD Tuesday; on max ESA; resume weekly Fe 4. Secondary hyperparathyroidism - on hectorol; P low without binder 5. HTN/volume - multiple meds - volume seems good 6. Nutrition - add supplements/vits to diet 7. SVT vs aflutter - on chronic coumadin/MTP 8. HIV - on usual meds 9. DM - BS [facility] Beeper [phone] 12/08/2017,11:29 AM  LOS: 3 days   Pt seen, examined and agree w A/P as above.  [name] MD [employer] pager [phone]   12/08/2017, 1:37 PM    Subjective:   Feels like he missed a whole day yesterday due to oversedation - better today with less pain med. Still having phantom pain - doesn't recall having it with his left leg surgery. Wants to go to rehab here.  Objective Vitals:   12/08/17 0616 12/08/17 0809 12/08/17 0924 12/08/17 0951  BP: 115/66 126/71 133/74   Pulse: (!) 113 (!) 108 (!) 109 (!) 111  Resp: 18 18 18  (!) 24  Temp: (!) 101.4 F (38.6 C) 98.7 F (37.1 C) 99.1 F (37.3 C) 98.6 F (37 C)  TempSrc: Oral Oral Oral Oral  SpO2: 90% 90% 92% 94%  Weight:      Height:       Physical Exam General: NAD Heart: tachy - regular Lungs: crackles right base breathing easily Abdomen: soft NT Extremities: left bka - no edema; right BKA with sock in place Dialysis Access: right AVF + bruit   Additional Objective Labs: Basic Metabolic Panel: Recent Labs  Lab 12/06/17 0419 12/06/17 1204 12/08/17 0531  NA 132* 130* 136  K 4.1 3.7 4.7  CL 92* 94* 97*  CO2 25 22 24   GLUCOSE 277* 229* 151*  BUN 41*  43* 40*  CREATININE 8.65* 9.00* 9.51*  CALCIUM 8.2* 7.9* 9.2  PHOS  --  2.9  --    Liver Function Tests: Recent Labs  Lab 12/05/17 0953 12/06/17 1204  AST 15  --   ALT 14*  --   ALKPHOS 102  --   BILITOT 0.9  --   PROT 7.7  --   ALBUMIN 2.6* 2.3*   No results for input(s): LIPASE, AMYLASE in the last 168 hours. CBC: Recent Labs  Lab 12/05/17 0953 12/06/17 0419 12/08/17 0531  WBC 14.0* 11.8* 13.5*  NEUTROABS 11.1*  --   --   HGB 8.4* 7.6* 6.8*  HCT 26.6* 23.8* 22.1*  MCV 94.0 92.2 93.6  PLT 314 309 316   Blood Culture    Component Value Date/Time   SDES BLOOD LEFT FOREARM 12/05/2017 1313   SPECREQUEST  12/05/2017 1313    BOTTLES DRAWN AEROBIC AND ANAEROBIC Blood Culture adequate volume   CULT NO GROWTH 2 DAYS 12/05/2017 1313   REPTSTATUS PENDING 12/05/2017 1313    Cardiac Enzymes: No results for input(s): CKTOTAL, CKMB, CKMBINDEX, TROPONINI in the last 168 hours. CBG: Recent Labs  Lab 12/07/17 0804 12/07/17 1205 12/07/17 1633 12/07/17 2121 12/08/17 0723  GLUCAP 201*  205* 98 148* 304*   Iron Studies: No results for input(s): IRON, TIBC, TRANSFERRIN, FERRITIN in the last 72 hours. Lab Results  Component Value Date   INR 1.84 12/08/2017   INR 1.74 12/07/2017   INR 3.56 12/06/2017   Studies/Results: Dg Chest Port 1 View  Result Date: 12/06/2017 CLINICAL DATA:  Hypoxia EXAM: PORTABLE CHEST 1 VIEW COMPARISON:  12/05/2017 FINDINGS: Cardiac shadow is mildly enlarged but stable. The lungs are somewhat hypoaerated. No focal infiltrate or sizable effusion is seen. No acute bony abnormality is noted. IMPRESSION: No active disease. Electronically Signed   By: Inez Catalina M.D.   On: 12/06/2017 18:08   Medications: . sodium chloride    . sodium chloride    . sodium chloride    . piperacillin-tazobactam (ZOSYN)  IV Stopped (12/08/17 0227)   . amLODipine  5 mg Oral QPM  . darbepoetin (ARANESP) injection - DIALYSIS  200 mcg Intravenous Q Sat-HD  . docusate  sodium  100 mg Oral BID  . doxercalciferol  5 mcg Intravenous Q T,Th,Sa-HD  . fosamprenavir  700 mg Oral BID  . insulin aspart  0-20 Units Subcutaneous TID WC  . insulin aspart  3 Units Subcutaneous TID WC  . insulin glargine  20 Units Subcutaneous QHS  . lisinopril  40 mg Oral Daily  . metoprolol tartrate  25 mg Oral BID  . raltegravir  400 mg Oral BID  . ritonavir  100 mg Oral Q breakfast  . Warfarin - Pharmacist Dosing Inpatient   Does not apply [phone]

## 2017-12-08 NOTE — Evaluation (Signed)
Occupational Therapy Evaluation Patient Details Name: Thomas [name] MRN: [id number] DOB: Apr 30, 1966 Today's Date: 12/08/2017    History of Present Illness Thomas [name] is a 51 y.o. male who presents with purulent draining abscess from the right calcaneus.  Patient has diabetic insensate neuropathy end-stage renal disease on dialysis, HIV positive, LBKA. now s/p RBKA on 12/8. Has Wound VAC in place   Clinical Impression   This 51 yo male admitted for above presents to acute OT with big limiting factor being pain thus causing decreased balance and thus affecting his safety and independence with basic ADLs. Pta he was independent to Mod I with basic ADLs. He will benefit from acute OT with follow up OT on CIR to get to a Mod I level.     Follow Up Recommendations  CIR    Equipment Recommendations  Other (comment)(TBD at next venue)    Recommendations for Other Services Rehab consult     Precautions / Restrictions Precautions Precautions: Fall Precaution Comments: wound vac RLE Required Braces or Orthoses: Other Brace/Splint Other Brace/Splint: LLE prothestic Restrictions Weight Bearing Restrictions: Yes RLE Weight Bearing: Non weight bearing      Mobility Bed Mobility Overal bed mobility: Needs Assistance Bed Mobility: Supine to Sit;Sit to Supine     Supine to sit: Mod assist(A for RLE and to elevate trunk) Sit to supine: Min assist(for RLE)      Transfers                 General transfer comment: Pt stated he was not ready to try and stand today    Balance Overall balance assessment: Needs assistance Sitting-balance support: No upper extremity supported;Feet supported Sitting balance-Leahy Scale: Fair                                     ADL either performed or assessed with clinical judgement   ADL Overall ADL's : Needs assistance/impaired Eating/Feeding: Independent;Bed level   Grooming: Set up;Bed level   Upper Body Bathing: Set  up;Bed level   Lower Body Bathing: Maximal assistance;Bed level   Upper Body Dressing : Moderate assistance;Bed level   Lower Body Dressing: Total assistance;Bed level                 General ADL Comments: Pt limited in his basic ADLs in unsupported sitting due to pain with transitional movements and throbbing when residual limb is in dependent position     Vision Patient Visual Report: No change from baseline              Pertinent Vitals/Pain Pain Assessment: 0-10 Pain Score: 5 (up to a 10 with movement) Pain Descriptors / Indicators: Throbbing Pain Intervention(s): Limited activity within patient's tolerance;Monitored during session;Repositioned     Hand Dominance Right   Extremity/Trunk Assessment Upper Extremity Assessment Upper Extremity Assessment: Overall WFL for tasks assessed           Communication Communication Communication: No difficulties   Cognition Arousal/Alertness: Awake/alert Behavior During Therapy: WFL for tasks assessed/performed Overall Cognitive Status: Within Functional Limits for tasks assessed                                                Home Living   Living Arrangements: Spouse/significant other;Other relatives(mother in law) Available Help  at Discharge: Family Type of Home: House Home Access: Ramped entrance     [place]: One level     Bathroom Shower/Tub: Tub/shower unit;Walk-in shower   Bathroom Toilet: Standard                Prior Functioning/Environment Level of Independence: Independent                 OT Problem List: Decreased strength;Decreased range of motion;Decreased activity tolerance;Impaired balance (sitting and/or standing);Pain      OT Treatment/Interventions: Self-care/ADL training;Balance training;Therapeutic activities;DME and/or AE instruction;Patient/family education    OT Goals(Current goals can be found in the care plan section) Acute Rehab OT  Goals Patient Stated Goal: to go to rehab and then home OT Goal Formulation: With patient Time For Goal Achievement: 12/22/17 Potential to Achieve Goals: Good  OT Frequency: Min 3X/week              AM-PAC PT "6 Clicks" Daily Activity     Outcome Measure Help from another person eating meals?: None Help from another person taking care of personal grooming?: A Little Help from another person toileting, which includes using toliet, bedpan, or urinal?: A Lot Help from another person bathing (including washing, rinsing, drying)?: A Lot Help from another person to put on and taking off regular upper body clothing?: A Little Help from another person to put on and taking off regular lower body clothing?: Total 6 Click Score: 15   End of Session    Activity Tolerance: Patient limited by pain Patient left: in bed;with call bell/phone within reach  OT Visit Diagnosis: Other abnormalities of gait and mobility (R26.89);Pain Pain - Right/Left: Right Pain - part of body: Leg                Time: 1217-1300 OT Time Calculation (min): 43 min Charges:  OT General Charges $OT Visit: 1 Visit OT Evaluation $OT Eval Moderate Complexity: 1 Mod OT Treatments $Self Care/Home Management : 23-37 mins [place], OTR/L [phone] 12/08/2017

## 2017-12-09 DIAGNOSIS — I5032 Chronic diastolic (congestive) heart failure: Secondary | ICD-10-CM

## 2017-12-09 DIAGNOSIS — D62 Acute posthemorrhagic anemia: Secondary | ICD-10-CM

## 2017-12-09 DIAGNOSIS — D638 Anemia in other chronic diseases classified elsewhere: Secondary | ICD-10-CM

## 2017-12-09 DIAGNOSIS — Z89511 Acquired absence of right leg below knee: Secondary | ICD-10-CM

## 2017-12-09 DIAGNOSIS — S88111D Complete traumatic amputation at level between knee and ankle, right lower leg, subsequent encounter: Secondary | ICD-10-CM

## 2017-12-09 DIAGNOSIS — G8918 Other acute postprocedural pain: Secondary | ICD-10-CM

## 2017-12-09 DIAGNOSIS — B20 Human immunodeficiency virus [HIV] disease: Secondary | ICD-10-CM

## 2017-12-09 DIAGNOSIS — Z8679 Personal history of other diseases of the circulatory system: Secondary | ICD-10-CM

## 2017-12-09 DIAGNOSIS — Z89512 Acquired absence of left leg below knee: Secondary | ICD-10-CM

## 2017-12-09 DIAGNOSIS — D72829 Elevated white blood cell count, unspecified: Secondary | ICD-10-CM

## 2017-12-09 DIAGNOSIS — Z794 Long term (current) use of insulin: Secondary | ICD-10-CM

## 2017-12-09 DIAGNOSIS — Z992 Dependence on renal dialysis: Secondary | ICD-10-CM

## 2017-12-09 DIAGNOSIS — R0902 Hypoxemia: Secondary | ICD-10-CM

## 2017-12-09 DIAGNOSIS — N186 End stage renal disease: Secondary | ICD-10-CM

## 2017-12-09 DIAGNOSIS — E118 Type 2 diabetes mellitus with unspecified complications: Secondary | ICD-10-CM

## 2017-12-09 LAB — CBC
HCT: 23.1 % — ABNORMAL LOW (ref 39.0–52.0)
Hemoglobin: 7.2 g/dL — ABNORMAL LOW (ref 13.0–17.0)
MCH: 28.6 pg (ref 26.0–34.0)
MCHC: 31.2 g/dL (ref 30.0–36.0)
MCV: 91.7 fL (ref 78.0–100.0)
PLATELETS: 289 10*3/uL (ref 150–400)
RBC: 2.52 MIL/uL — AB (ref 4.22–5.81)
RDW: 17.7 % — AB (ref 11.5–15.5)
WBC: 12.8 10*3/uL — AB (ref 4.0–10.5)

## 2017-12-09 LAB — RENAL FUNCTION PANEL
Albumin: 2.1 g/dL — ABNORMAL LOW (ref 3.5–5.0)
Anion gap: 16 — ABNORMAL HIGH (ref 5–15)
BUN: 52 mg/dL — ABNORMAL HIGH (ref 6–20)
CO2: 22 mmol/L (ref 22–32)
Calcium: 8.8 mg/dL — ABNORMAL LOW (ref 8.9–10.3)
Chloride: 96 mmol/L — ABNORMAL LOW (ref 101–111)
Creatinine, Ser: 11.41 mg/dL — ABNORMAL HIGH (ref 0.61–1.24)
GFR calc Af Amer: 5 mL/min — ABNORMAL LOW (ref >60)
GFR calc non Af Amer: 4 mL/min — ABNORMAL LOW (ref >60)
Glucose, Bld: 233 mg/dL — ABNORMAL HIGH (ref 65–99)
Phosphorus: 6.8 mg/dL — ABNORMAL HIGH (ref 2.5–4.6)
Potassium: 4.4 mmol/L (ref 3.5–5.1)
Sodium: 134 mmol/L — ABNORMAL LOW (ref 135–145)

## 2017-12-09 LAB — GLUCOSE, CAPILLARY
GLUCOSE-CAPILLARY: 145 mg/dL — AB (ref 65–99)
Glucose-Capillary: 308 mg/dL — ABNORMAL HIGH (ref 65–99)
Glucose-Capillary: 185 mg/dL — ABNORMAL HIGH (ref 65–99)

## 2017-12-09 LAB — PREPARE RBC (CROSSMATCH)

## 2017-12-09 LAB — PROTIME-INR
INR: 2.11
Prothrombin Time: 23.4 seconds — ABNORMAL HIGH (ref 11.4–15.2)

## 2017-12-09 MED ORDER — DOXERCALCIFEROL 4 MCG/2ML IV SOLN
INTRAVENOUS | Status: AC
Start: 2017-12-09 — End: 2017-12-09
  Administered 2017-12-09: 5 ug via INTRAVENOUS
  Filled 2017-12-09: qty 4

## 2017-12-09 MED ORDER — SODIUM CHLORIDE 0.9 % IV SOLN: Freq: Once | INTRAVENOUS | Status: DC

## 2017-12-09 MED ORDER — LIDOCAINE HCL (PF) 1 % IJ SOLN: 5.0000 mL | INTRAMUSCULAR | Status: DC | PRN

## 2017-12-09 MED ORDER — INSULIN GLARGINE 100 UNIT/ML ~~LOC~~ SOLN
25.0000 [IU] | Freq: Every day | SUBCUTANEOUS | Status: DC
Start: 2017-12-09 — End: 2017-12-11
  Administered 2017-12-09 – 2017-12-10 (×2): 25 [IU] via SUBCUTANEOUS
  Filled 2017-12-09 (×3): qty 0.25

## 2017-12-09 MED ORDER — PENTAFLUOROPROP-TETRAFLUOROETH EX AERO: 1.0000 "application " | INHALATION_SPRAY | CUTANEOUS | Status: DC | PRN

## 2017-12-09 MED ORDER — INSULIN ASPART 100 UNIT/ML ~~LOC~~ SOLN
4.0000 [IU] | Freq: Three times a day (TID) | SUBCUTANEOUS | Status: DC
  Administered 2017-12-09 – 2017-12-11 (×7): 4 [IU] via SUBCUTANEOUS

## 2017-12-09 MED ORDER — OXYCODONE HCL 5 MG PO TABS
ORAL_TABLET | ORAL | Status: AC
  Filled 2017-12-09: qty 2

## 2017-12-09 MED ORDER — LIDOCAINE-PRILOCAINE 2.5-2.5 % EX CREA: 1.0000 "application " | TOPICAL_CREAM | CUTANEOUS | Status: DC | PRN

## 2017-12-09 MED ORDER — WARFARIN SODIUM 2.5 MG PO TABS
2.5000 mg | ORAL_TABLET | Freq: Once | ORAL | Status: AC
  Administered 2017-12-09: 2.5 mg via ORAL
  Filled 2017-12-09 (×2): qty 1

## 2017-12-09 MED ORDER — ALTEPLASE 2 MG IJ SOLR: 2.0000 mg | Freq: Once | INTRAMUSCULAR | Status: DC | PRN

## 2017-12-09 MED ORDER — SODIUM CHLORIDE 0.9 % IV SOLN: 100.0000 mL | INTRAVENOUS | Status: DC | PRN

## 2017-12-09 MED ORDER — OXYCODONE HCL 5 MG PO TABS
ORAL_TABLET | ORAL | Status: AC
  Administered 2017-12-09: 10 mg via ORAL
  Filled 2017-12-09: qty 2

## 2017-12-09 NOTE — Progress Notes (Signed)
  Date: 12/09/2017  Patient name: Thomas [name]  Medical record number: [id number]  Date of birth: September 23, 1966   I have seen and evaluated this patient and I have discussed the plan of care with the house staff. Please see Dr. Allayne Gitelman note for complete details. I concur with her findings.    Sid Falcon, MD 12/09/2017, 2:04 PM

## 2017-12-09 NOTE — PMR Pre-admission (Signed)
PMR Admission Coordinator Pre-Admission Assessment  Patient: Thomas [name] is an 51 y.o., male MRN: [id number] DOB: 09-03-1966 Height: 6\' 6"  (198.1 cm) Weight: 117 kg (257 lb 15 oz)              Insurance Information HMO:     PPO:      PCP:      IPA:      80/20: yes     OTHER: no HMO PRIMARY: Medicare a and b      Policy#: [id number]      Subscriber: pt Benefits:  Phone #: passport one online 12/09/2017 Eff. 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 Used a left prosthesis provided by [employer] prior to admission. Presented 12/05/2017 with nonhealing right foot wound. X-rays and imaging of right foot showed multifocal marrow edema in the calcaneus and in the distal 5 cm of the fibula consistent with osteomyelitis. Large skin ulceration along the lateral aspect of the ankle extending almost to the peroneal tendons. 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 Sharol Given and application of wound VAC. Hospital course pain management. Hemodialysis ongoing as per renal services. Noted acute on chronic anemia  6.8 he was transfused. Chronic Coumadin has been resumed.   Past Medical History  Past Medical History:  Diagnosis Date  . Anemia   . Chronic combined systolic and diastolic CHF (congestive heart failure) ([place])   . Diabetes ([place])   . ESRD (end stage renal disease) [facility])    [facility] [address] T, Th, Sat  . HIV disease ([place])   . Hyperparathyroidism, secondary ([place])   . Hypertension   . Hypertensive heart disease with end stage renal disease on dialysis ([place]) 03/13/2017  . SVT (supraventricular tachycardia) ([place])    ? afib or atrial flutter s/p TEE/DCCV with subsequent ablation due to reoccurrence in [place]  . Type 2 diabetes mellitus ([facility])     Family History  family history includes Diabetes in his mother; Heart disease in his father and mother.  Prior Rehab/Hospitalizations:  Has the patient had major surgery during 100 days prior to admission? No  Current Medications   Current Facility-Administered Medications:  .  0.9 %  sodium chloride infusion, 100 mL, Intravenous, PRN, Madelon Lips, MD .  0.9 %  sodium chloride infusion, 100 mL, Intravenous, PRN, Madelon Lips, MD .  0.9 %  sodium chloride infusion, , Intravenous, Continuous,  [name], MD .  0.9 %  sodium chloride infusion, , Intravenous, Once, Alric Seton, PA-C .  acetaminophen (TYLENOL) tablet 650 mg, 650 mg, Oral, Q4H PRN, 650 mg at 12/08/17 1718 **OR** acetaminophen (TYLENOL) suppository 650 mg, 650 mg, Rectal, Q4H PRN, [name], MD .  amLODipine (NORVASC) tablet 5 mg, 5 mg, Oral, QPM, Hoffman, Jessica Ratliff, DO, 5 mg at 12/10/17 1834 .  camphor-menthol (SARNA) lotion, , Topical, PRN, Neva Seat, MD .  Darbepoetin Alfa (ARANESP) injection 200 mcg, 200 mcg, Intravenous, Q Sat-HD, Madelon Lips, MD, 200 mcg at 12/06/17 1504 .  diphenhydrAMINE (BENADRYL) capsule 25 mg, 25 mg, Oral, Once, Harbrecht, Lawrence, MD .  diphenhydrAMINE (BENADRYL) capsule 25 mg, 25 mg, Oral, Q8H PRN, Alric Seton,  PA-C .  doxercalciferol (HECTOROL) injection 5 mcg, 5 mcg, Intravenous, Q T,Th,Sa-HD, Madelon Lips, MD, 5 mcg at 12/09/17 1113 .  feeding supplement (NEPRO CARB STEADY) liquid 237 mL, 237 mL, Oral, BID BM, Alric Seton, PA-C, 237 mL at 12/11/17 0915 .  ferric gluconate (NULECIT) 125 mg in sodium chloride 0.9 % 100 mL IVPB, 125 mg, Intravenous, Q Thu-HD, Bergman, Martha, PA-C .  fosamprenavir (LEXIVA) tablet 700 mg, 700 mg, Oral, BID, Kalman Shan Ratliff, DO, 700 mg at 12/11/17 0916 .  HYDROmorphone (DILAUDID) injection 1 mg, 1 mg, Intravenous, Q2H PRN, Hoffman, Jessica Ratliff, DO, 1 mg at 12/08/17 1718 .  insulin aspart (novoLOG) injection 0-20 Units, 0-20 Units, Subcutaneous, TID WC, Hoffman, Jessica Ratliff, DO, 3 Units at 12/11/17 0915 .  insulin aspart (novoLOG) injection 4 Units, 4 Units, Subcutaneous, TID WC, Colbert Ewing, MD, 4 Units at 12/11/17 0915 .  insulin glargine (LANTUS) injection 25 Units, 25 Units, Subcutaneous, QHS, Colbert Ewing, MD, 25 Units at 12/10/17 2235 .  lip balm (BLISTEX) ointment, , Topical, PRN, Lucious Groves, DO .  lisinopril (PRINIVIL,ZESTRIL) tablet 40 mg, 40 mg, Oral, Daily, Hoffman, Jessica Ratliff, DO, 40 mg at 12/10/17 0955 .  metoCLOPramide (REGLAN) tablet 5-10 mg, 5-10 mg, Oral, Q8H PRN **OR** metoCLOPramide (REGLAN) injection 5-10 mg, 5-10 mg, Intravenous, Q8H PRN, [name], MD .  metoprolol tartrate (LOPRESSOR) tablet 25 mg, 25 mg, Oral, BID, Hoffman, Jessica Ratliff, DO, 25 mg at 12/10/17 2234 .  multivitamin (RENA-VIT) tablet 1 tablet, 1 tablet, Oral, QHS, Alric Seton, PA-C, 1 tablet at 12/10/17 2235 .  naloxone [facility]) injection 0.4 mg, 0.4 mg, Intravenous, PRN, Alphonzo Grieve, MD, 0.4 mg at 12/06/17 1758 .  ondansetron (ZOFRAN) tablet 4 mg, 4 mg, Oral, Q6H PRN **OR** ondansetron (ZOFRAN) injection 4 mg, 4 mg, Intravenous, Q6H PRN, [name], MD .  oxyCODONE (Oxy IR/ROXICODONE) immediate release tablet 10 mg, 10 mg,  Oral, Q3H PRN, [name], MD, 10 mg at 12/10/17 2234 .  oxyCODONE (Oxy IR/ROXICODONE) immediate release tablet 5 mg, 5 mg, Oral, Q3H PRN, [name], MD, 5 mg at 12/08/17 1053 .  raltegravir (ISENTRESS) tablet 400 mg, 400 mg, Oral, BID, Kalman Shan Ratliff, DO, 400 mg at 12/11/17 0916 .  ritonavir (NORVIR) capsule 100 mg, 100 mg, Oral, Q breakfast, Hoffman, Jessica Ratliff, DO, 100 mg at 12/11/17 0916 .  Warfarin - Pharmacist Dosing Inpatient, , Does not apply, q1800, Bajbus, Lauren D, RPH  Patients Current Diet: Diet renal with fluid restriction Fluid restriction: 1200 mL Fluid; Room service appropriate? Yes; Fluid consistency: Thin  Precautions / Restrictions Precautions Precautions: Fall Precaution Comments: wound vac RLE Other Brace/Splint: LLE prothestic Restrictions Weight Bearing Restrictions: Yes RLE Weight Bearing: Non  weight bearing   Has the patient had 2 or more falls or a fall with injury in the past year?No  Prior Activity Level Community (5-7x/wk): Mod I iwth cane and prosthetic leg pta; did not drive; wife drove to hemodialysis  Home Assistive Devices / [place] Devices/Equipment: Cane (specify quad or straight), Wheelchair(Prostethic)  Prior Device Use: Indicate devices/aids used by the patient prior to current illness, exacerbation or injury? Orthotics/Prosthetics. Pt states he received his prosthetic leg 2 to 3 years ago in [place]  Prior Functional Level Prior Function Level of Independence: Independent with assistive device(s) Comments: LLE prosthesis  Self Care: Did the patient need help bathing, dressing, using the toilet or eating?  Independent  Indoor Mobility: Did the patient need assistance with walking from room to room (with or without device)? Independent  Stairs: Did the patient need assistance with internal or external stairs (with or without device)? Independent  Functional Cognition: Did the patient need help planning  regular tasks such as shopping or remembering to take medications? Needed some help  Current Functional Level Cognition  Overall Cognitive Status: Within Functional Limits for tasks assessed Orientation Level: Oriented X4    Extremity Assessment (includes Sensation/Coordination)  Upper Extremity Assessment: Overall WFL for tasks assessed  Lower Extremity Assessment: Generalized weakness    ADLs  Overall ADL's : Needs assistance/impaired Eating/Feeding: Independent, Bed level Grooming: Set up, Sitting, Wash/dry hands, Wash/dry face Grooming Details (indicate cue type and reason): in recliner Upper Body Bathing: Set up, Bed level Lower Body Bathing: Maximal assistance, Bed level Upper Body Dressing : Moderate assistance, Bed level Lower Body Dressing: Total assistance, Bed level Toilet Transfer: Moderate assistance, +2 for physical assistance, +2 for safety/equipment, Anterior/posterior Toilet Transfer Details (indicate cue type and reason): simulated through recliner transfer, use of bed pads to assist  General ADL Comments: Pt limited in his basic ADLs in unsupported sitting due to pain with transitional movements and throbbing when residual limb is in dependent position    Mobility  Overal bed mobility: Needs Assistance Bed Mobility: Supine to Sit, Sit to Supine Supine to sit: Mod assist Sit to supine: Mod assist General bed mobility comments: pt sitting upright with bilateral residual limbs supported in a long sitting position upon arrival    Transfers  Overall transfer level: Needs assistance Equipment used: (bed pads) Transfers: Government social research officer transfers: Mod assist, +2 physical assistance  Lateral/Scoot Transfers: Min assist General transfer comment: assist with use of bed pads to perform A-P transfers from bed to recliner    Ambulation / Gait / Stairs / Wheelchair Mobility  Ambulation/Gait General Gait Details: not attempted due to pain  complaints    Posture / Balance Dynamic Sitting Balance Sitting balance - Comments: reliant on at least one UE support to maintain upright sitting Balance Overall balance assessment: Needs assistance Sitting-balance support: Single extremity supported Sitting balance-Leahy Scale: Poor Sitting balance - Comments: reliant on at least one UE support to maintain upright sitting Postural control: Posterior lean Standing balance support: Bilateral upper extremity supported Standing balance comment: unable to assist himself due to pain    Special needs/care consideration BiPAP/CPAP  N/a CPM  N/a Continuous Drip IV n/a Dialysis Tues, Thurs, Sat [address] Life Vest  N/a Oxygen  N/a Special Bed  N/a Trach Size  N/a Wound Vac yes R BKA surgical site for planned 7 days Skin surgical incision to stump Bowel mgmt: continent LBM 12/13. Loose BMS so pt requested to have stool softeners  d/c'd Bladder mgmt:anuric Diabetic mgmt yes pta   Previous Home Environment Living Arrangements: (and mother in law)  Lives With: Spouse(and mother in Sports coach) Available Help at Discharge: Family, Available 24 hours/day Type of Home: House Home Layout: 1/2 bath on main level, Bed/bath upstairs, Two level Home Access: Ramped entrance Bathroom Shower/Tub: (upstairs) Bathroom Toilet: Standard Bathroom Accessibility: Yes How Accessible: Accessible via walker Spray: No Additional Comments: pt requesting information on stair lifts he wants to install to reach upstairs; also about any avialble funding if there is any  Pt reports he was Mod I with cane and prosthetic leg pta. Did not drive  Discharge Living Setting Plans for Discharge Living Setting: Patient's home, Lives with (comment)(wife and mother in law) Type of Home at Discharge: House Discharge Home Layout: Two level, Bed/bath upstairs, 1/2 bath on main level Discharge Home Access: [place] entrance Discharge Bathroom Shower/Tub:  Tub/shower unit, Walk-in shower Discharge Bathroom Toilet: Standard Discharge Bathroom Accessibility: Yes How Accessible: Accessible via walker Does the patient have any problems obtaining your medications?: No  Social/Family/Support Systems Patient Roles: Spouse, Parent Contact Information: [name] , wife Anticipated Caregiver: wife Anticipated Ambulance person Information: see above Ability/Limitations of Caregiver: wife retired also; 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[place]; 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 Caregiver Availability: 24/7 Discharge Plan Discussed with Primary Caregiver: Yes Is Caregiver In Agreement with Plan?: Yes Does Caregiver/Family have Issues with Lodging/Transportation while Pt is in Rehab?: No  Goals/Additional Needs Patient/Family Goal for Rehab: Mod I to supervision with PT and OT at a wheelchair level Expected length of stay: ELOS 7 to 12 days Special Service Needs: Hemodialysis 3 times per week(wife transports pt to hemodialysis) Pt/Family Agrees to Admission and willing to participate: Yes Program Orientation Provided & Reviewed with Pt/Caregiver Including Roles  & Responsibilities: Yes  Barriers to Discharge: (bed and bathroom upstairs; 1/2 bath downstairs; pt looking i)  Decrease burden of Care through IP rehab admission: n/a  Possible need for SNF placement upon discharge:not anticipated  Patient Condition: This patient's medical and functional status has changed since the consult dated: 12/08/2017 in which the Rehabilitation Physician determined and documented that the patient's condition is appropriate for intensive rehabilitative care in an inpatient rehabilitation facility. See "History of Present Illness" (above) for medical update. Functional changes are: mod assist with transfers to chair. Patient's medical and functional status update has been discussed with the Rehabilitation physician and patient remains appropriate for inpatient rehabilitation. Will admit to  inpatient rehab today.  Preadmission Screen Completed By:  Cleatrice Burke, 12/11/2017 10:27 AM ______________________________________________________________________   Discussed status with Dr. Letta Pate on 12/11/2017 at  1029 and received telephone approval for admission today.  Admission Coordinator:  Cleatrice Burke, time 6962 Date 12/11/2017

## 2017-12-09 NOTE — Progress Notes (Signed)
ANTICOAGULATION CONSULT NOTE - Follow-Up  Pharmacy Consult for warfarin Indication: atrial fibrillation  No Known Allergies  Patient Measurements: Height: 6\' 6"  (198.1 cm) Weight: 249 lb 5.4 oz (113.1 kg) IBW/kg (Calculated) : 91.4  Vital Signs: Temp: 98.4 F (36.9 C) (12/11 0748) Temp Source: Oral (12/11 0748) BP: 112/79 (12/11 1000) Pulse Rate: 82 (12/11 1000)  Labs: Recent Labs    12/06/17 1204 12/07/17 1518  12/08/17 0531 12/08/17 1345 12/09/17 0537 12/09/17 0700  HGB  --   --    < > 6.8* 7.3* 7.2*  --   HCT  --   --   --  22.1* 23.3* 23.1*  --   PLT  --   --   --  316  --  289  --   LABPROT  --  20.2*  --  21.1*  --  23.4*  --   INR  --  1.74  --  1.84  --  2.11  --   CREATININE 9.00*  --   --  9.51*  --   --  11.41*   < > = values in this interval not displayed.    Estimated Creatinine Clearance: 10.8 mL/min (A) (by C-G formula based on SCr of 11.41 mg/dL (H)).   Assessment: 89 YOM with AFib who was admitted with a supratherapeutic INR. INR was 3.46 on 12/7- patient received vitamin K 2.5mg  PO to reverse INR to <2 for surgery on 12/8. Warfarin as restarted on 12/9.  INR today is therapeutic despite holding the dose yesterday (INR 2.11 << 1.84, goal of 2-3). Hgb 7.2 << 6.8 after 1 unit PRBC. No active bleeding noted per RN report - could be residual blood loss from recent amputation.   Discussed with IMTS Ronalee Red) and okayed warfarin restart today. Given the patient's INR trends - will reduce the dose and monitor.   Goal of Therapy:  INR 2-3 Monitor platelets by anticoagulation protocol: Yes   Plan:  1. Warfarin 2.5 mg x 1 dose at 1800 today 2. Will continue to monitor for any signs/symptoms of bleeding and will follow up with PT/INR in the a.m.  Thank you for allowing pharmacy to be a part of this patient's care.  Alycia Rossetti, PharmD, BCPS Clinical Pharmacist Pager: [phone] Clinical phone for 12/09/2017 from 7a-3:30p: [phone] If after 3:30p,  please call main pharmacy at: [phone] 12/09/2017 10:42 AM

## 2017-12-09 NOTE — Consult Note (Signed)
Physical Medicine and Rehabilitation Consult Reason for Consult: Decreased functional mobility Referring Physician: Internal medicine   HPI: Thomas [name] is a 51 y.o. right handed male with history of diabetes mellitus, end-stage renal disease with hemodialysis, SVT and maintained on chronic Coumadin, HIV positive, diastolic congestive heart failure, left BKA 3 years ago in [place]. Per chart review and patient, patient lives with wife and mother-in-law. Used a left prosthesis provided by [employer] prior to admission. Family assistance provided as needed. Presented 12/05/2017 with nonhealing right foot wound. X-rays and imaging of right foot showed multifocal marrow edema in the calcaneus and in the distal 5 cm of the fibula consistent with osteomyelitis. Large skin ulceration along the lateral aspect of the ankle extending almost to the peroneal tendons. 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 Sharol Given and application of wound VAC. Hospital course pain management. Hemodialysis ongoing as per renal services. Noted acute on chronic anemia 6.8 he was transfused. Chronic Coumadin has been resumed. Physical therapy evaluation completed 12/08/2017 with recommendations of physical medicine rehabilitation consult.   Review of Systems  Constitutional: Negative for chills.  HENT: Negative for hearing loss.   Eyes: Negative for blurred vision and double vision.  Respiratory: Positive for shortness of breath. Negative for cough.   Gastrointestinal: Positive for constipation. Negative for nausea and vomiting.  Genitourinary: Negative for flank pain and hematuria.  Musculoskeletal: Positive for joint pain and myalgias.  Skin: Negative for rash.  All other systems reviewed and are negative.  Past Medical History:  Diagnosis Date  . Anemia   . Chronic combined systolic and diastolic CHF (congestive heart failure) ([place])   . Diabetes ([place])   .  ESRD (end stage renal disease) [facility])    [facility] [address] T, Th, Sat  . HIV disease ([place])   . Hyperparathyroidism, secondary ([place])   . Hypertension   . Hypertensive heart disease with end stage renal disease on dialysis ([place]) 03/13/2017  . SVT (supraventricular tachycardia) ([place])    ? afib or atrial flutter s/p TEE/DCCV with subsequent ablation due to reoccurrence in [place]  . Type 2 diabetes mellitus ([place])    Past Surgical History:  Procedure Laterality Date  . AMPUTATION Left    foot  . AMPUTATION Right 12/06/2017   Procedure: AMPUTATION BELOW KNEE;  Surgeon: [name], MD;  Location: [place];  Service: Orthopedics;  Laterality: Right;  . APPLICATION OF WOUND VAC Right 09/04/2017   Procedure: APPLICATION OF WOUND VAC;  Surgeon: [name], DPM;  Location: [place];  Service: Podiatry;  Laterality: Right;  . APPLICATION OF WOUND VAC  12/06/2017   Procedure: APPLICATION OF WOUND VAC;  Surgeon: [name], MD;  Location: [place];  Service: Orthopedics;;  . COLONOSCOPY WITH PROPOFOL N/A 07/30/2017   Procedure: COLONOSCOPY WITH PROPOFOL;  Surgeon: [name], MD;  Location: [place];  Service: Endoscopy;  Laterality: N/A;  . ESOPHAGOGASTRODUODENOSCOPY (EGD) WITH PROPOFOL N/A 07/28/2017   Procedure: ESOPHAGOGASTRODUODENOSCOPY (EGD) WITH PROPOFOL;  Surgeon: [name], MD;  Location: [place];  Service: Endoscopy;  Laterality: N/A;  . FLEXIBLE SIGMOIDOSCOPY N/A 07/27/2017   Procedure: FLEXIBLE SIGMOIDOSCOPY;  Surgeon: [name], MD;  Location: [facility] ENDOSCOPY;  Service: Endoscopy;  Laterality: N/A;  . GRAFT APPLICATION Right [id number]/83/6629   Procedure: SKIN GRAFT APPLICATION RIGHT FOOT;  Surgeon: [name], DPM;  Location: [place];  Service: Podiatry;  Laterality: Right;  . I&D EXTREMITY  Right 09/04/2017   Procedure: IRRIGATION AND DEBRIDEMENT EXTREMITY;  Surgeon: [name], DPM;  Location: [place];  Service: Podiatry;  Laterality: Right;  . I&D EXTREMITY Right  11/12/2017   Procedure: IRRIGATION AND DEBRIDEMENT ULCER RIGHT FOOT;  Surgeon: [name], DPM;  Location: [place];  Service: Podiatry;  Laterality: Right;  . RIGHT AV FISTULA PLACEMENT  10/19/2012  . WOUND DEBRIDEMENT N/A 09/24/2017   Procedure: DEBRIDEMENT WOUND;  Surgeon: [name], DPM;  Location: [place];  Service: Podiatry;  Laterality: N/A;   Family History  Problem Relation Age of Onset  . Heart disease Mother   . Diabetes Mother   . Heart disease Father   . Cancer Neg Hx    Social History:  reports that  has never smoked. he has never used smokeless tobacco. He reports that he does not drink alcohol or use drugs. Allergies: No Known Allergies Medications Prior to Admission  Medication Sig Dispense Refill  . amLODipine (NORVASC) 5 MG tablet Take 1 tablet (5 mg total) by mouth daily. (Patient taking differently: Take 5 mg by mouth every evening. ) 30 tablet 0  . cinacalcet (SENSIPAR) 60 MG tablet Take 60 mg by mouth daily.    . clindamycin (CLEOCIN) 150 MG capsule Take 1 capsule (150 mg total) by mouth 3 (three) times daily. 21 capsule 0  . doxercalciferol (HECTOROL) 4 MCG/2ML injection Inject 2.5 mLs (5 mcg total) into the vein Every Tuesday,Thursday,and Saturday with dialysis. 2 mL   . ferric gluconate 125 mg in sodium chloride 0.9 % 100 mL Inject 125 mg into the vein Every Tuesday,Thursday,and Saturday with dialysis.    . fosamprenavir (LEXIVA) 700 MG tablet Take 700 mg by mouth 2 (two) times daily.     . insulin aspart (NOVOLOG) 100 UNIT/ML injection CBG 70 - 120: 0 units CBG 121 - 150: 3 units CBG 151 - 200: 4 units CBG 201 - 250: 7 units CBG 251 - 300: 11 units CBG 301 - 350: 15 units CBG 351 - 400: 20 units (Patient taking differently: Inject 0-20 Units into the skin 3 (three) times daily with meals. CBG 70 - 120: 0 units CBG 121 - 150: 3 units CBG 151 - 200: 4 units CBG 201 - 250: 7 units CBG 251 - 300: 11 units CBG 301 - 350: 15 units CBG 351 - 400: 20  units) 10 mL 11  . insulin glargine (LANTUS) 100 UNIT/ML injection Inject 0.3 mLs (30 Units total) into the skin daily at 10 pm. 10 mL 11  . lisinopril (PRINIVIL,ZESTRIL) 40 MG tablet Take 40 mg by mouth daily.    . metoprolol tartrate (LOPRESSOR) 25 MG tablet Take 1 tablet (25 mg total) by mouth 2 (two) times daily. 60 tablet 0  . Multiple Vitamin (TAB-A-VITE PO) Take 1 tablet by mouth daily.    . raltegravir (ISENTRESS) 400 MG tablet Take 400 mg by mouth 2 (two) times daily.    . ritonavir (NORVIR) 100 MG capsule Take 100 mg by mouth daily with breakfast.    . sevelamer carbonate (RENVELA) 800 MG tablet Take 2,400 mg by mouth 3 (three) times daily with meals.     . warfarin (COUMADIN) 10 MG tablet Take 10 mg by mouth See admin instructions. Take 10 mg by mouth daily Sunday, Monday, Wednesday and Friday    . warfarin (COUMADIN) 7.5 MG tablet Take 7.5 mg by mouth See admin instructions. Take 7.5 mg by mouth daily on Tuesday, Thursday ans Saturday    .  Soft Lens Products (SALINE) 0.9 % SOLN Apply to gauze as part of saline wet to dry dressing 1 Bottle 0    Home: Home Living Family/patient expects to be discharged to:: Private residence Living Arrangements: Spouse/significant other, Other relatives Available Help at Discharge: Family Type of Home: House Home Access: Ramped entrance Home Layout: One level Bathroom Shower/Tub: Tub/shower unit, Multimedia programmer: Standard Additional Comments: Pt is 6'6" tall, previously up with no AD  Functional History: Prior Function Level of Independence: Independent with assistive device(s) Comments: LLE prosthesis Functional Status:  Mobility: Bed Mobility Overal bed mobility: Needs Assistance Bed Mobility: Supine to Sit, Sit to Supine Supine to sit: Mod assist Sit to supine: Mod assist General bed mobility comments: help back to bed with RLE but assisted with LLE as well to ease pain with the effort Transfers Overall transfer level:  Needs assistance Equipment used: 1 person hand held assist Transfers: Lateral/Scoot Transfers  Lateral/Scoot Transfers: Min assist General transfer comment: declined to fully stand but can clear hips on bedside to scoot to [facility] Ambulation/Gait General Gait Details: not attempted due to pain complaints    ADL: ADL Overall ADL's : Needs assistance/impaired Eating/Feeding: Independent, Bed level Grooming: Set up, Bed level Upper Body Bathing: Set up, Bed level Lower Body Bathing: Maximal assistance, Bed level Upper Body Dressing : Moderate assistance, Bed level Lower Body Dressing: Total assistance, Bed level General ADL Comments: Pt limited in his basic ADLs in unsupported sitting due to pain with transitional movements and throbbing when residual limb is in dependent position  Cognition: Cognition Overall Cognitive Status: Within Functional Limits for tasks assessed Orientation Level: Oriented X4 Cognition Arousal/Alertness: Lethargic, Suspect due to medications Behavior During Therapy: Anxious(about causing pain on RLE) Overall Cognitive Status: Within Functional Limits for tasks assessed  Blood pressure 132/74, pulse (!) 102, temperature 98.9 F (37.2 C), temperature source Oral, resp. rate 20, height 6\' 6"  (1.981 m), weight 113.1 kg (249 lb 5.4 oz), SpO2 93 %. Physical Exam  Vitals reviewed. Constitutional: He is oriented to person, place, and time. He appears well-developed and well-nourished.  HENT:  Head: Normocephalic and atraumatic.  Eyes: EOM are normal. Right eye exhibits no discharge. Left eye exhibits no discharge.  Neck: Normal range of motion. Neck supple. No thyromegaly present.  Cardiovascular: Regular rhythm.  +Tachycardia  Respiratory: Effort normal and breath sounds normal. No respiratory distress.  GI: Soft. Bowel sounds are normal. He exhibits no distension.  Musculoskeletal:  RLE tenderness  Neurological: He is alert and oriented to person, place, and  time.  Motor: B/l UE, LLE HF, KE 5/5 LLE: Not willing to move due to pain  Skin:  Left BKA is well healed. Right BKA is dressed with wound VAC  Psychiatric: He has a normal mood and affect. His behavior is normal.    Results for orders placed or performed during the hospital encounter of 12/05/17 (from the past 24 hour(s))  Prepare RBC     Status: None   Collection Time: 12/08/17  6:57 AM  Result Value Ref Range   Order Confirmation ORDER PROCESSED BY BLOOD BANK   Glucose, capillary     Status: Abnormal   Collection Time: 12/08/17  7:23 AM  Result Value Ref Range   Glucose-Capillary 304 (H) 65 - 99 mg/dL  Glucose, capillary     Status: Abnormal   Collection Time: 12/08/17 11:55 AM  Result Value Ref Range   Glucose-Capillary 233 (H) 65 - 99 mg/dL  Hemoglobin and hematocrit,  blood     Status: Abnormal   Collection Time: 12/08/17  1:45 PM  Result Value Ref Range   Hemoglobin 7.3 (L) 13.0 - 17.0 g/dL   HCT 23.3 (L) 39.0 - 52.0 %  Glucose, capillary     Status: Abnormal   Collection Time: 12/08/17  5:06 PM  Result Value Ref Range   Glucose-Capillary 197 (H) 65 - 99 mg/dL  Glucose, capillary     Status: Abnormal   Collection Time: 12/08/17 10:20 PM  Result Value Ref Range   Glucose-Capillary 198 (H) 65 - 99 mg/dL  CBC     Status: Abnormal   Collection Time: 12/09/17  5:37 AM  Result Value Ref Range   WBC 12.8 (H) 4.0 - 10.5 K/uL   RBC 2.52 (L) 4.22 - 5.81 MIL/uL   Hemoglobin 7.2 (L) 13.0 - 17.0 g/dL   HCT 23.1 (L) 39.0 - 52.0 %   MCV 91.7 78.0 - 100.0 fL   MCH 28.6 26.0 - 34.0 pg   MCHC 31.2 30.0 - 36.0 g/dL   RDW 17.7 (H) 11.5 - 15.5 %   Platelets 289 150 - 400 K/uL   No results found.  Assessment/Plan: Diagnosis: Right BKA with history of left BKA Labs and images independently reviewed.  Records reviewed and summated above. Clean amputation daily with soap and water Monitor incision site for signs of infection or impending skin breakdown. Staples to remain in place  for 3-4 weeks Stump shrinker, for edema control  Scar mobilization massaging to prevent soft tissue adherence Stump protector during therapies Prevent flexion contractures by implementing the following:   Encourage prone lying for 20-30 mins per day BID to avoid hip flexion  Contractures if medically appropriate;  Avoid pillow under knees when patient is lying in bed in order to prevent both  knee and hip flexion contractures;  Avoid prolonged sitting Post surgical pain control with oral medication Phantom limb pain control with physical modalities including desensitization techniques (gentle self massage to the residual stump,hot packs if sensation intact, [place]) and mirror therapy, TENS. If ineffective, consider pharmacological treatment for neuropathic pain (e.g gabapentin, pregabalin, amytriptalyine, duloxetine).  When using wheelchair, patient should have knee on amputated side fully extended with board under the seat cushion.  1. Does the need for close, 24 hr/day medical supervision in concert with the patient's rehab needs make it unreasonable for this patient to be served in a less intensive setting? Yes  2. Co-Morbidities requiring supervision/potential complications: diabetes mellitus (Monitor in accordance with exercise and adjust meds as necessary), end-stage renal disease with hemodialysis (recs per Nephro), SVT (cont meds, monitor with increased mobility), HIV positive, diastolic congestive heart failure (monitor for signs/symptoms of fluid overload), left BKA, post-op pain management (Biofeedback training with therapies to help reduce reliance on opiate pain medications, particularly IV dilaudid, monitor pain control during therapies, and sedation at rest and titrate to maximum efficacy to ensure participation and gains in therapies), ABLA on anemia of chronic disease (transfuse again if necessary to ensure appropriate perfusion for increased activity tolerance), leukocytosis (cont to  monitor for signs and symptoms of infection, further workup if indicated), supplemental oxygen dependent 3. Due to safety, skin/wound care, disease management, pain management and patient education, does the patient require 24 hr/day rehab nursing? Yes 4. Does the patient require coordinated care of a physician, rehab nurse, PT (1-2 hrs/day, 5 days/week) and OT (1-2 hrs/day, 5 days/week) to address physical and functional deficits in the context of the above medical diagnosis(es)?  Yes Addressing deficits in the following areas: balance, endurance, locomotion, strength, transferring, bathing, dressing, toileting and psychosocial support 5. Can the patient actively participate in an intensive therapy program of at least 3 hrs of therapy per day at least 5 days per week? Yes 6. The potential for patient to make measurable gains while on inpatient rehab is excellent 7. Anticipated functional outcomes upon discharge from inpatient rehab are Supervision/Mod I at wheelchair level  with PT, Supervision/Mod I at wheelchair level with OT, n/a with SLP. 8. Estimated rehab length of stay to reach the above functional goals is: 7-12 days. 9. Anticipated D/C setting: Home 10. Anticipated post D/C treatments: HH therapy and Home excercise program 11. Overall Rehab/Functional Prognosis: good  RECOMMENDATIONS: This patient's condition is appropriate for continued rehabilitative care in the following setting: CIR once able to tolerate 3 hours of therapy/day Patient has agreed to participate in recommended program. Yes Note that insurance prior authorization may be required for reimbursement for recommended care.  Comment: Rehab Admissions Coordinator to follow up.  Delice Lesch, MD, ABPMR Lavon Paganini Angiulli, PA-C 12/09/2017

## 2017-12-09 NOTE — Care Management Note (Signed)
Case Management Note  Patient Details  Name: Thomas [name] MRN: [id number] Date of Birth: Mar 13, 1966  Subjective/Objective:    CM following for progression and d/c planning.                 Action/Plan: 12/09/2017 Pt wife with request for Advanced Directive paperwork, this CM provided this information. Now requesting assist with housing that does not have both bedrooms upstairs as pt is now bilateral amputee. Per wife and pt they have recently moved to [place] and purchased a home with all bedrooms upstairs.  This CM is unable to assist with this issue however advised pt and wife to discuss with CIR social worker upon admission to that department as their focus is on rehab and d/c to home and they may be able to provide info re this.    Expected Discharge Date:                  Expected Discharge Plan:  [place]  In-House Referral:  Clinical Social Work  Discharge planning Services  CM Consult  Post Acute Care Choice:  NA Choice offered to:  NA  DME Arranged:  N/A DME Agency:  NA  HH Arranged:  NA HH Agency:  NA  Status of Service:  In process, will continue to follow  If discussed at Long Length of Stay Meetings, dates discussed:    Additional Comments:  Adron Bene, RN 12/09/2017, 4:09 PM

## 2017-12-09 NOTE — Progress Notes (Signed)
[facility] Progress Note   Dialysis Orders: TTS [facility] 4h 37min 3k/2.25 bath 123kg Hep none -mircera 225 q 2wks- last got 150 on 11/29 -hect 5 ug tiw -venofer 100 q wk  Assessment/Plan: 1. S/p right BKA 12/7 -wound VAC in place - for transfer to rehab 2. ESRD - TTS - new edw for d/c- no heparin HD; on HD K 4.4 -next HD Thursday 3. Anemia -  hgb down to 6.8 - transfused 1 unit PRBC 12/10  Up to 7.2 -- plan another on HD today on HD; on max ESA; resume weekly Fe 4. Secondary hyperparathyroidism - on hectorol; P low without binder now up today - if next P is up - will add binder 5. HTN/volume - multiple meds - volume seems good 6. Nutrition - add supplements/vits to diet 7. SVT vs aflutter - on chronic coumadin/MTP- ^^ ectopy on HD avoid low K 8. HIV - on usual meds 9. DM - BS 200s 10. ^ BMs - lax stopped per patient request - advised we can resume slowly if he needs in the future.  [name], PA-C [place] 12/09/2017,9:32 AM  LOS: 4 days   Pt seen, examined and agree w A/P as above.  [name] MD [facility] pager [phone]   12/09/2017, 2:06 PM    Subjective:   Having BMs - wants all stools softeners or laxatives d/c - going to inpatient rehab  Objective Vitals:   12/09/17 0526 12/09/17 0748 12/09/17 0800 12/09/17 0830  BP: 132/74 120/76 120/72 120/73  Pulse: (!) 102 96 95 96  Resp: 20 (!) 21    Temp: 98.9 F (37.2 C) 98.4 F (36.9 C)    TempSrc: Oral Oral    SpO2:  96%    Weight:      Height:       Physical Exam General: NAD Heart: irreg with ectopy on HD Lungs: no rales Abdomen: soft NT Extremities: left BKA right BKA with sock/VAC Dialysis Access: right AVF   Additional Objective Labs: Basic Metabolic Panel: Recent Labs  Lab 12/06/17 1204 12/08/17 0531 12/09/17 0700  NA 130* 136 134*  K 3.7 4.7 4.4  CL 94* 97* 96*  CO2 22 24 22   GLUCOSE 229* 151* 233*  BUN 43*  40* 52*  CREATININE 9.00* 9.51* 11.41*  CALCIUM 7.9* 9.2 8.8*  PHOS 2.9  --  6.8*   Liver Function Tests: Recent Labs  Lab 12/05/17 0953 12/06/17 1204 12/09/17 0700  AST 15  --   --   ALT 14*  --   --   ALKPHOS 102  --   --   BILITOT 0.9  --   --   PROT 7.7  --   --   ALBUMIN 2.6* 2.3* 2.1*   No results for input(s): LIPASE, AMYLASE in the last 168 hours. CBC: Recent Labs  Lab 12/05/17 0953 12/06/17 0419 12/08/17 0531 12/08/17 1345 12/09/17 0537  WBC 14.0* 11.8* 13.5*  --  12.8*  NEUTROABS 11.1*  --   --   --   --   HGB 8.4* 7.6* 6.8* 7.3* 7.2*  HCT 26.6* 23.8* 22.1* 23.3* 23.1*  MCV 94.0 92.2 93.6  --  91.7  PLT 314 309 316  --  289   Blood Culture    Component Value Date/Time   SDES BLOOD LEFT FOREARM 12/05/2017 1313   SPECREQUEST  12/05/2017 1313    BOTTLES DRAWN AEROBIC AND ANAEROBIC Blood Culture adequate volume  CULT NO GROWTH 3 DAYS 12/05/2017 1313   REPTSTATUS PENDING 12/05/2017 1313    Cardiac Enzymes: No results for input(s): CKTOTAL, CKMB, CKMBINDEX, TROPONINI in the last 168 hours. CBG: Recent Labs  Lab 12/07/17 2121 12/08/17 0723 12/08/17 1155 12/08/17 1706 12/08/17 2220  GLUCAP 148* 304* 233* 197* 198*   Iron Studies: No results for input(s): IRON, TIBC, TRANSFERRIN, FERRITIN in the last 72 hours. Lab Results  Component Value Date   INR 2.11 12/09/2017   INR 1.84 12/08/2017   INR 1.74 12/07/2017   Studies/Results: No results found. Medications: . sodium chloride    . sodium chloride    . sodium chloride    . sodium chloride    . sodium chloride    . [START ON 12/11/2017] ferric gluconate (FERRLECIT/NULECIT) IV     . amLODipine  5 mg Oral QPM  . darbepoetin (ARANESP) injection - DIALYSIS  200 mcg Intravenous Q Sat-HD  . docusate sodium  100 mg Oral BID  . doxercalciferol  5 mcg Intravenous Q T,Th,Sa-HD  . feeding supplement (NEPRO CARB STEADY)  237 mL Oral BID BM  . fosamprenavir  700 mg Oral BID  . insulin aspart  0-20  Units Subcutaneous TID WC  . insulin aspart  3 Units Subcutaneous TID WC  . insulin glargine  20 Units Subcutaneous QHS  . lisinopril  40 mg Oral Daily  . metoprolol tartrate  25 mg Oral BID  . multivitamin  1 tablet Oral QHS  . raltegravir  400 mg Oral BID  . ritonavir  100 mg Oral Q breakfast  . Warfarin - Pharmacist Dosing Inpatient   Does not apply [phone]

## 2017-12-09 NOTE — Progress Notes (Signed)
I met with pt at bedside to discuss rehab goals and expectations of an inpt acute rehab program. He is in agreement and prefers inpt rehab than other venues. I will clarify bed availability over the next few days and let the team know tomorrow. [phone]

## 2017-12-09 NOTE — Progress Notes (Signed)
Subjective:  Thomas [name] was seen in the HD unit this morning. Reports that he still has some RLE pain, but it is improved. Reports relief with pain medication he is receiving. Denies chest pain or shortness of breath. Eager to continue working with therapy, and amenable to discharge to inpatient rehab.  Objective:  Vital signs in last 24 hours: Vitals:   12/09/17 0526 12/09/17 0748 12/09/17 0800 12/09/17 0830  BP: 132/74 120/76 120/72 120/73  Pulse: (!) 102 96 95 96  Resp: 20 (!) 21    Temp: 98.9 F (37.2 C) 98.4 F (36.9 C)    TempSrc: Oral Oral    SpO2:  96%    Weight:      Height:       GEN: Obese, pleasant male lying in bed. Alert and oriented. RESP: Clear to auscultation bilaterally. No wheezes, rales, or rhonchi. No increased work of breathing. CV: Mildly tachycardic and regular rhythm. No murmurs, gallops, or rubs. No LE edema. ABD: Soft. Non-tender. Non-distended. Normoactive bowel sounds. EXT: Warm. S/p R BKA with wound vac and compressive sleeve in place as well as L BKA. NEURO: Cranial nerves II-XII grossly intact. Able to lift all four extremities against gravity. No apparent audiovisual hallucinations. Speech fluent and appropriate. PSYCH: Patient is calm and pleasant. Appropriate affect. Well-groomed; speech is appropriate and on-subject.  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  Osteomyelitis of right ankle and foot S/p transtibial amputation on 12/8 with Ortho. Has  Wound VAC in place. On Vanc/Zosyn. BCx NGTD. Spiked fever to 101.4 yesterday, afebrile since. Patient amenable to discharge to [facility] following; appreciate their assistance - Can discontinue Vanc/Zosyn after dialysis today - Non-weight bearing on right - Wound VAC x1 week - Incentive spirometer - Oxy IR 5-10mg  q3h PRN for pain control - F/u with Ortho outpt 1 week post-op - Continue to monitor fever curve - PT/OT eval, recommending CIR - CIR consult  Fever to 101.4 Yesterday, afebrile since. Patient denies infectious symptoms consistent with PNA, UTI, or abdominal infection. BCx NGTD x3d. Will continue to monitor and consider further evaluation/treatment if patient develops another fever. - Can discontinue Vanc/Zosyn after dialysis today - Monitor fever curve - F/u CD4 count  Acute blood loss anemia Hb 7.2 this morning after 1u pRBC yesterday. INR 2.11. No signs of GI bleed. - Can resume warfarin - CBC in AM  ESRD with HD TuThSa HD today. K 4.4, BUN 52, Cr 11.41 - Nephrology following, appreciate their assistance - HD today - Renal function panel in AM  DM2 Home regimen includes 30u Lantus QHS and SSI. - CBG monitoring - Lantus 25u QHS + Novolog 4u TID + SSI  HTN, CHF, ?afib/flutter Home BP regimen includes amlodipine 5mg  qday, lisinopril 40mg  qday, and metoprolol 25mg  BID. On warfarin for anticoagulation. CHADS2-VASc score of at least 3, which gives him 4.6% annual risk of stroke. INR 2.11 today. - Telemetry - Continue home amlodipine, lisinopril, and metoprolol -  Can resume warfarin - INR checks  HIV Last viral load 30 in March 2018, CD4 count 310 in May 2018. - Continue home ritonavir, fosamprenavir, and raltegravir - F/u CD4 count  Dispo: Anticipated discharge in approximately 1-2 day(s).  Colbert Ewing, MD 12/09/2017, 8:50 AM Pager: Mamie Nick [phone]

## 2017-12-10 LAB — TYPE AND SCREEN
ABO/RH(D): B POS
Antibody Screen: NEGATIVE
UNIT DIVISION: 0
Unit division: 0

## 2017-12-10 LAB — CBC
HCT: 27.5 % — ABNORMAL LOW (ref 39.0–52.0)
HEMOGLOBIN: 8.4 g/dL — AB (ref 13.0–17.0)
MCH: 27.9 pg (ref 26.0–34.0)
MCHC: 30.5 g/dL (ref 30.0–36.0)
MCV: 91.4 fL (ref 78.0–100.0)
Platelets: 314 10*3/uL (ref 150–400)
RBC: 3.01 MIL/uL — ABNORMAL LOW (ref 4.22–5.81)
RDW: 20.3 % — AB (ref 11.5–15.5)
WBC: 10.5 10*3/uL (ref 4.0–10.5)

## 2017-12-10 LAB — BPAM RBC
BLOOD PRODUCT EXPIRATION DATE: 201812172359
Blood Product Expiration Date: 201812312359
ISSUE DATE / TIME: 201812100924
ISSUE DATE / TIME: 201812111028
UNIT TYPE AND RH: 7300
Unit Type and Rh: 5100

## 2017-12-10 LAB — PROTIME-INR
INR: 1.72
PROTHROMBIN TIME: 20 s — AB (ref 11.4–15.2)

## 2017-12-10 LAB — T-HELPER CELLS (CD4) COUNT (NOT AT ARMC)
CD4 % Helper T Cell: 33 % (ref 33–55)
CD4 T Cell Abs: 490 /uL (ref 400–2700)

## 2017-12-10 LAB — CULTURE, BLOOD (ROUTINE X 2)
CULTURE: NO GROWTH
Culture: NO GROWTH
SPECIAL REQUESTS: ADEQUATE
Special Requests: ADEQUATE

## 2017-12-10 LAB — GLUCOSE, CAPILLARY
GLUCOSE-CAPILLARY: 172 mg/dL — AB (ref 65–99)
GLUCOSE-CAPILLARY: 165 mg/dL — AB (ref 65–99)
GLUCOSE-CAPILLARY: 205 mg/dL — AB (ref 65–99)
GLUCOSE-CAPILLARY: 207 mg/dL — AB (ref 65–99)

## 2017-12-10 MED ORDER — WARFARIN SODIUM 7.5 MG PO TABS
7.5000 mg | ORAL_TABLET | Freq: Once | ORAL | Status: DC
  Administered 2017-12-10: 7.5 mg via ORAL
  Filled 2017-12-10: qty 1

## 2017-12-10 NOTE — Progress Notes (Signed)
Physical Therapy Treatment Patient Details Name: Thomas [name] MRN: [id number] DOB: [birth year] Today's Date: 12/10/2017    History of Present Illness Thomas [name] is a 51 y.o. male who presents with purulent draining abscess from the right calcaneus.  Patient has diabetic insensate neuropathy end-stage renal disease on dialysis, HIV positive, LBKA. now s/p RBKA on 12/8. Has Wound VAC in place    PT Comments    Pt seen for mobility progression and increased tolerance to therapeutic intervention. Pt performed A-P transfer from bed to recliner chair with mod A x2. Pt is very motivated and eager to improve with therapy. Pt stated that he has a bed for tomorrow in CIR. PT will continue to follow acutely and progress mobility as tolerated.    Follow Up Recommendations  CIR     Equipment Recommendations  None recommended by PT    Recommendations for Other Services       Precautions / Restrictions Precautions Precautions: Fall Precaution Comments: wound vac RLE Required Braces or Orthoses: Other Brace/Splint Other Brace/Splint: LLE prothestic Restrictions Weight Bearing Restrictions: Yes RLE Weight Bearing: Non weight bearing    Mobility  Bed Mobility               General bed mobility comments: pt sitting upright with bilateral residual limbs supported in a long sitting position upon arrival  Transfers Overall transfer level: Needs assistance Equipment used: (bed pads) Transfers: Comptroller transfers: Mod assist;+2 physical assistance   General transfer comment: assist with use of bed pads to perform A-P transfers from bed to recliner; pt refusing to perform sit<>stand with L prosthesis this session  Ambulation/Gait                 Stairs            Wheelchair Mobility    Modified Rankin (Stroke Patients Only)       Balance Overall balance assessment: Needs assistance Sitting-balance support: Single  extremity supported Sitting balance-Leahy Scale: Poor Sitting balance - Comments: reliant on at least one UE support to maintain upright sitting Postural control: Posterior lean                                  Cognition Arousal/Alertness: Awake/alert Behavior During Therapy: WFL for tasks assessed/performed Overall Cognitive Status: Within Functional Limits for tasks assessed                                        Exercises Amputee Exercises Quad Sets: AROM;Strengthening;5 reps;Seated;Right Hip ABduction/ADduction: AAROM;Strengthening;Right;10 reps;Seated Straight Leg Raises: AAROM;Strengthening;Right;5 reps;Seated    General Comments        Pertinent Vitals/Pain Pain Assessment: Faces Faces Pain Scale: Hurts little more Pain Location: R leg with any movement Pain Descriptors / Indicators: Throbbing Pain Intervention(s): Monitored during session;Repositioned    Home Living                      Prior Function            PT Goals (current goals can now be found in the care plan section) Acute Rehab PT Goals PT Goal Formulation: With patient Time For Goal Achievement: 12/22/17 Potential to Achieve Goals: Good Progress towards PT goals: Progressing toward goals    Frequency    Min  3X/week      PT Plan Current plan remains appropriate    Co-evaluation PT/OT/SLP Co-Evaluation/Treatment: Yes Reason for Co-Treatment: For patient/therapist safety;To address functional/ADL transfers PT goals addressed during session: Mobility/safety with mobility;Balance;Strengthening/ROM        AM-PAC PT "6 Clicks" Daily Activity  Outcome Measure  Difficulty turning over in bed (including adjusting bedclothes, sheets and blankets)?: A Little Difficulty moving from lying on back to sitting on the side of the bed? : A Little Difficulty sitting down on and standing up from a chair with arms (e.g., wheelchair, bedside commode, etc,.)?:  Unable Help needed moving to and from a bed to chair (including a wheelchair)?: A Lot Help needed walking in hospital room?: Total Help needed climbing 3-5 steps with a railing? : Total 6 Click Score: 11    End of Session   Activity Tolerance: Patient limited by pain;Patient limited by fatigue Patient left: in chair;with call bell/phone within reach;with chair alarm set Nurse Communication: Mobility status PT Visit Diagnosis: Unsteadiness on feet (R26.81);Muscle weakness (generalized) (M62.81);Pain;Difficulty in walking, not elsewhere classified (R26.2) Pain - Right/Left: Right Pain - part of body: Leg     Time: 1357-1436 PT Time Calculation (min) (ACUTE ONLY): 39 min  Charges:  $Therapeutic Activity: 23-37 mins                    G Codes:       [place], PT, DPT [place] 12/10/2017, 4:04 PM

## 2017-12-10 NOTE — Progress Notes (Signed)
  Date: 12/10/2017  Patient name: Thomas [name]  Medical record number: [id number]  Date of birth: 07/23/1966   I have seen and evaluated this patient and I have discussed the plan of care with the house staff. Please see Dr. Allayne Gitelman note for complete details. I concur with her findings.  Planning for CIR transfer when patient able to tolerate PT for 3 hours per day.  Likely this week.    Sid Falcon, MD 12/10/2017, 1:33 PM

## 2017-12-10 NOTE — Progress Notes (Signed)
I have informed pt, RN CM and SW that I will have an inpt rehab bed to admit pt to tomorrow. [phone]

## 2017-12-10 NOTE — Care Management Important Message (Signed)
Important Message  Patient Details  Name: Thomas [name] MRN: [id number] Date of Birth: 1966-04-07   Medicare Important Message Given:  Yes    Can Lucci Montine Circle 12/10/2017, 1:53 PM

## 2017-12-10 NOTE — Progress Notes (Signed)
Subjective:  Reports RLE pain is improving. Denies chest pain or shortness of breath. Eager to continue working with therapy. States he does have some difficulty sitting up on the side of the bed due to pain in his RLE, but has no problem sitting up in the bed.  Does note episodes of diaphoresis and had multiple episodes of NB diarrhea yesterday. Denies abdominal pain or N/V. No BMs yet this morning.  Objective:  Vital signs in last 24 hours: Vitals:   12/09/17 1225 12/09/17 1809 12/09/17 2045 12/10/17 0536  BP:  118/75 110/70 118/66  Pulse:  88 92 81  Resp:  20 16 16   Temp:  98.2 F (36.8 C) 98.9 F (37.2 C) 97.6 F (36.4 C)  TempSrc:  Oral    SpO2:  93% 93% 96%  Weight: 257 lb 15 oz (117 kg)  257 lb 15 oz (117 kg)   Height:       GEN: Obese, pleasant male lying in bed. Alert and oriented. RESP: Clear to auscultation bilaterally. No wheezes, rales, or rhonchi. No increased work of breathing. CV: Normal rate and regular rhythm. No murmurs, gallops, or rubs. No LE edema. ABD: Soft. Non-tender. Non-distended. Normoactive bowel sounds. EXT: Warm. S/p R BKA with wound vac and compressive sleeve in place as well as L BKA. NEURO: Cranial nerves II-XII grossly intact. Able to lift all four extremities against gravity. No apparent audiovisual hallucinations. Speech fluent and appropriate. PSYCH: Patient is calm and pleasant. Appropriate affect. Well-groomed; speech is appropriate and on-subject.  CBC Latest Ref Rng & Units 12/10/2017 12/09/2017 12/08/2017  WBC 4.0 - 10.5 K/uL 10.5 12.8(H) -  Hemoglobin 13.0 - 17.0 g/dL 8.4(L) 7.2(L) 7.3(L)  Hematocrit 39.0 - 52.0 % 27.5(L) 23.1(L) 23.3(L)  Platelets 150 - 400 K/uL 314 289 -   INR 1.72 BCx NGTD x4d  Assessment/Plan:  Active Problems:   ESRD on dialysis ([place])   Type 2 diabetes mellitus with complication (HCC)   HIV disease (HCC)   Diabetic wet gangrene of the foot (HCC)   Subacute osteomyelitis, right ankle and foot (HCC)  Wound infection   Cutaneous abscess of right foot   Hypoxia   History of supraventricular tachycardia   Chronic diastolic congestive heart failure (HCC)   Unilateral complete BKA, right, subsequent encounter ([place])   S/P bilateral BKA (below knee amputation) ([place])   Acute blood loss anemia   Anemia of chronic disease   Leukocytosis   Post-operative pain  Mr. 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  Diaphoresis and multiple episodes of NB diarrhea Patient afebrile with stable vital signs. New as of yesterday. No abdominal pain or N/V. No BMs yet this morning. - Continue to monitor - May need C. Diff testing if this persists given patient's recent antibiotics  Osteomyelitis of right ankle and foot S/p transtibial amputation on 12/8 with Ortho. Vanc/zosyn now off. Has Wound VAC in place. BCx NGTD x4d. Afebrile. Patient amenable to discharge to [facility] following; appreciate their assistance - Can discontinue Vanc/Zosyn after dialysis today - Non-weight bearing on right - Wound VAC x1 week - Oxy IR 5-10mg  q3h PRN for pain control - F/u with Ortho outpt 1 week post-op - Continue to monitor fever curve - PT/OT eval, recommending CIR - Discharge to CIR when  able - Monitor fever curve - F/u CD4 count  Acute blood loss anemia Hb 8.4 this morning after 1u pRBC yesterday. Received another unit in HD 12/11. INR 1.72. No signs of GI bleed. - Home warfarin resumed  ESRD with HD TuThSa HD today. K 4.4, BUN 52, Cr 11.41 - Nephrology following, appreciate their assistance - HD tomorrow  DM2 Home regimen includes 30u Lantus QHS and SSI. - CBG monitoring - Lantus 25u QHS + Novolog 4u TID + SSI  HTN, CHF, ?afib/flutter Home BP regimen includes amlodipine 5mg  qday,  lisinopril 40mg  qday, and metoprolol 25mg  BID. On warfarin for anticoagulation. CHADS2-VASc score of at least 3, which gives him 4.6% annual risk of stroke. INR 1.72 today. - Telemetry - Continue home amlodipine, lisinopril, and metoprolol - Home warfarin resumed. - INR checks  HIV Last viral load 30 in March 2018, CD4 count 310 in May 2018. - Continue home ritonavir, fosamprenavir, and raltegravir - F/u CD4 count  Dispo: Anticipated discharge in approximately 0-1 day(s).  Colbert Ewing, MD 12/10/2017, 8:45 AM Pager: Mamie Nick [phone]

## 2017-12-10 NOTE — Progress Notes (Signed)
Thomas Thomas Progress Note   Dialysis Orders: TTS [facility] right AVF 4h 26min 3k/2.25 bath 123kg Hep none -mircera 225 q 2wks- last got 150 on 11/29 -hect 5 ug tiw -venofer 100 q wk  Assessment: 1.S/p right BKA 12/7-wound VAC in place - for transfer to rehab when bed is available 2. ESRD- TTS - new edw for d/c- no heparin HD; next HD Thursday 3. Anemia-  hgb up to 8.4 after 1 unit PRBC 12/10  And 1 unit 12/11 on max ESA; resume weekly Fe 4. Secondary hyperparathyroidism- on hectorol;P low without binder now up today - if next P is up - will add binder 5.HTN/volume- multiple meds - volume seems good net UF 2 L Tuesday post wt 117 6. Nutrition- add supplements/vits to diet 7. SVT vs aflutter -on chronic coumadin/MTP- ^^ ectopy on HD avoid low K 8.HIV - on usual meds 9. DM -BS 200s 10. ^ BMs - lax stopped per patient request - advised we can resume slowly if he needs in the future.   Thomas Jacobson, PA-C [facility] Thomas Thomas [phone] 12/10/2017,10:27 AM  LOS: 5 days   Pt seen, examined and agree w A/P as above.  Thomas Splinter MD [employer] pager [phone]   12/10/2017, 2:19 PM    Subjective:   No prob with HD yesterday. No more lose stools  Objective Vitals:   12/09/17 1809 12/09/17 2045 12/10/17 0536 12/10/17 0904  BP: 118/75 110/70 118/66 121/76  Pulse: 88 92 81 94  Resp: 20 16 16 18   Temp: 98.2 F (36.8 C) 98.9 F (37.2 C) 97.6 F (36.4 C) 98.6 F (37 C)  TempSrc: Oral   Oral  SpO2: 93% 93% 96% 94%  Weight:  117 kg (257 lb 15 oz)    Height:       Physical Exam General: NAD Heart: RRR Lungs: no rales Abdomen: soft NT Extremities: right BKA with sock/VAC - no sig edema left BKA no sig edeam Dialysis Access: right AVF + bruit   Additional Objective Labs: Basic Metabolic Panel: Recent Labs  Lab 12/06/17 1204 12/08/17 0531 12/09/17 0700  NA 130* 136 134*  K 3.7 4.7 4.4  CL 94* 97* 96*   CO2 22 24 22   GLUCOSE 229* 151* 233*  BUN 43* 40* 52*  CREATININE 9.00* 9.51* 11.41*  CALCIUM 7.9* 9.2 8.8*  PHOS 2.9  --  6.8*   Liver Function Tests: Recent Labs  Lab 12/05/17 0953 12/06/17 1204 12/09/17 0700  AST 15  --   --   ALT 14*  --   --   ALKPHOS 102  --   --   BILITOT 0.9  --   --   PROT 7.7  --   --   ALBUMIN 2.6* 2.3* 2.1*   No results for input(s): LIPASE, AMYLASE in the last 168 hours. CBC: Recent Labs  Lab 12/05/17 0953 12/06/17 0419 12/08/17 0531 12/08/17 1345 12/09/17 0537 12/10/17 0644  WBC 14.0* 11.8* 13.5*  --  12.8* 10.5  NEUTROABS 11.1*  --   --   --   --   --   HGB 8.4* 7.6* 6.8* 7.3* 7.2* 8.4*  HCT 26.6* 23.8* 22.1* 23.3* 23.1* 27.5*  MCV 94.0 92.2 93.6  --  91.7 91.4  PLT 314 309 316  --  289 314   Blood Culture    Component Value Date/Time   SDES BLOOD LEFT FOREARM 12/05/2017 1313   SPECREQUEST  12/05/2017 1313    BOTTLES  DRAWN AEROBIC AND ANAEROBIC Blood Culture adequate volume   CULT NO GROWTH 4 DAYS 12/05/2017 1313   REPTSTATUS PENDING 12/05/2017 1313    Cardiac Enzymes: No results for input(s): CKTOTAL, CKMB, CKMBINDEX, TROPONINI in the last 168 hours. CBG: Recent Labs  Lab 12/08/17 2220 12/09/17 1240 12/09/17 1707 12/09/17 2043 12/10/17 0749  GLUCAP 198* 145* 308* 185* 172*   Iron Studies: No results for input(s): IRON, TIBC, TRANSFERRIN, FERRITIN in the last 72 hours. Lab Results  Component Value Date   INR 1.72 12/10/2017   INR 2.11 12/09/2017   INR 1.84 12/08/2017   Studies/Results: No results found. Medications: . sodium chloride    . sodium chloride    . sodium chloride    . sodium chloride    . [START ON 12/11/2017] ferric gluconate (FERRLECIT/NULECIT) IV     . amLODipine  5 mg Oral QPM  . darbepoetin (ARANESP) injection - DIALYSIS  200 mcg Intravenous Q Sat-HD  . doxercalciferol  5 mcg Intravenous Q T,Th,Sa-HD  . feeding supplement (NEPRO CARB STEADY)  237 mL Oral BID BM  . fosamprenavir  700 mg  Oral BID  . insulin aspart  0-20 Units Subcutaneous TID WC  . insulin aspart  4 Units Subcutaneous TID WC  . insulin glargine  25 Units Subcutaneous QHS  . lisinopril  40 mg Oral Daily  . metoprolol tartrate  25 mg Oral BID  . multivitamin  1 tablet Oral QHS  . raltegravir  400 mg Oral BID  . ritonavir  100 mg Oral Q breakfast  . warfarin  7.5 mg Oral ONCE-1800  . Warfarin - Pharmacist Dosing Inpatient   Does not apply [phone]

## 2017-12-10 NOTE — Progress Notes (Signed)
Occupational Therapy Treatment Patient Details Name: Thomas [name] MRN: [id number] DOB: 1966-08-15 Today's Date: 12/10/2017    History of present illness Thomas [name] is a 51 y.o. male who presents with purulent draining abscess from the right calcaneus.  Patient has diabetic insensate neuropathy end-stage renal disease on dialysis, HIV positive, LBKA. now s/p RBKA on 12/8. Has Wound VAC in place   OT comments  Pt progressing towards OT goals this session, set up for UB ADL andf AP transfer to recliner with mod A +2. Pt is very motivated and eager to improve with therapy. Pt likes to talk a lot about overcoming his mental blocks for recovery, motivated by family and his church. Pt stated that he has a bed for tomorrow in CIR. OT will continue to follow acutely.  Follow Up Recommendations  CIR    Equipment Recommendations  Other (comment)(defer to next venue)    Recommendations for Other Services Rehab consult    Precautions / Restrictions Precautions Precautions: Fall Precaution Comments: wound vac RLE Required Braces or Orthoses: Other Brace/Splint Other Brace/Splint: LLE prothestic Restrictions Weight Bearing Restrictions: Yes RLE Weight Bearing: Non weight bearing       Mobility Bed Mobility               General bed mobility comments: pt sitting upright with bilateral residual limbs supported in a long sitting position upon arrival  Transfers Overall transfer level: Needs assistance Equipment used: (bed pads) Transfers: Comptroller transfers: Mod assist;+2 physical assistance   General transfer comment: assist with use of bed pads to perform A-P transfers from bed to recliner    Balance Overall balance assessment: Needs assistance Sitting-balance support: Single extremity supported Sitting balance-Leahy Scale: Poor Sitting balance - Comments: reliant on at least one UE support to maintain upright sitting Postural  control: Posterior lean                                 ADL either performed or assessed with clinical judgement   ADL Overall ADL's : Needs assistance/impaired     Grooming: Set up;Sitting;Wash/dry hands;Wash/dry face Grooming Details (indicate cue type and reason): in recliner                 Toilet Transfer: Moderate assistance;+2 for physical assistance;+2 for safety/equipment;Anterior/posterior Armed forces technical officer Details (indicate cue type and reason): simulated through recliner transfer, use of bed pads to assist                  Vision       Perception     Praxis      Cognition Arousal/Alertness: Awake/alert Behavior During Therapy: WFL for tasks assessed/performed Overall Cognitive Status: Within Functional Limits for tasks assessed                                          Exercises Exercises: Amputee Amputee Exercises Quad Sets: AROM;Strengthening;5 reps;Seated;Right Hip ABduction/ADduction: AAROM;Strengthening;Right;10 reps;Seated Straight Leg Raises: AAROM;Strengthening;Right;5 reps;Seated Chair Push Up: AROM;Both;Other (comment)(during functional activity - encouraged while sitting in rec)   Shoulder Instructions       General Comments      Pertinent Vitals/ Pain       Pain Assessment: Faces Faces Pain Scale: Hurts little more Pain Location: R leg with any movement Pain Descriptors /  Indicators: Throbbing Pain Intervention(s): Monitored during session;Repositioned  Home Living                                          Prior Functioning/Environment              Frequency  Min 3X/week        Progress Toward Goals  OT Goals(current goals can now be found in the care plan section)  Progress towards OT goals: Progressing toward goals  Acute Rehab OT Goals Patient Stated Goal: long term: swimmig for health, and some simple basketball OT Goal Formulation: With patient Time For Goal  Achievement: 12/22/17 Potential to Achieve Goals: Good  Plan Discharge plan remains appropriate;Frequency remains appropriate    Co-evaluation    PT/OT/SLP Co-Evaluation/Treatment: Yes Reason for Co-Treatment: For patient/therapist safety;To address functional/ADL transfers PT goals addressed during session: Mobility/safety with mobility OT goals addressed during session: ADL's and self-care;Strengthening/ROM      AM-PAC PT "6 Clicks" Daily Activity     Outcome Measure   Help from another person eating meals?: None Help from another person taking care of personal grooming?: A Little Help from another person toileting, which includes using toliet, bedpan, or urinal?: A Lot Help from another person bathing (including washing, rinsing, drying)?: A Lot Help from another person to put on and taking off regular upper body clothing?: A Little Help from another person to put on and taking off regular lower body clothing?: Total 6 Click Score: 15    End of Session    OT Visit Diagnosis: Other abnormalities of gait and mobility (R26.89);Pain Pain - Right/Left: Right Pain - part of body: Leg   Activity Tolerance Patient tolerated treatment well   Patient Left in chair;with chair alarm set;with call bell/phone within reach   Nurse Communication Mobility status        Time: 3094-0768 OT Time Calculation (min): 38 min  Charges: OT General Charges $OT Visit: 1 Visit OT Treatments $Self Care/Home Management : 8-22 mins  Hulda Humphrey OTR/L [place] 12/10/2017, 4:17 PM

## 2017-12-10 NOTE — Progress Notes (Signed)
ANTICOAGULATION CONSULT NOTE - Follow-Up  Pharmacy Consult for warfarin Indication: atrial fibrillation  No Known Allergies  Patient Measurements: Height: 6\' 6"  (198.1 cm) Weight: 257 lb 15 oz (117 kg) IBW/kg (Calculated) : 91.4  Vital Signs: Temp: 98.6 F (37 C) (12/12 0904) Temp Source: Oral (12/12 0904) BP: 121/76 (12/12 0904) Pulse Rate: 94 (12/12 0904)  Labs: Recent Labs    12/08/17 0531 12/08/17 1345 12/09/17 0537 12/09/17 0700 12/10/17 0644  HGB 6.8* 7.3* 7.2*  --  8.4*  HCT 22.1* 23.3* 23.1*  --  27.5*  PLT 316  --  289  --  314  LABPROT 21.1*  --  23.4*  --  20.0*  INR 1.84  --  2.11  --  1.72  CREATININE 9.51*  --   --  11.41*  --     Estimated Creatinine Clearance: 11 mL/min (A) (by C-G formula based on SCr of 11.41 mg/dL (H)).   Assessment: 60 YOM with AFib who was admitted with a supratherapeutic INR. INR was 3.46 on 12/7- patient received vitamin K 2.5mg  PO to reverse INR to <2 for surgery on 12/8. Warfarin as restarted on 12/9.  INR today is SUBtherapeutic after restarting dosing yesterday (INR 1.72 << 2.11, goal of 2-3). Hgb up to 8/4 << 7.2 after a total of 3 units PRBC over 12/10012/11. No active bleeding noted per RN report.  The patient was re-educated on warfarin today.  Goal of Therapy:  INR 2-3 Monitor platelets by anticoagulation protocol: Yes   Plan:  1. Warfarin 7.5 mg x 1 dose at 1800 today 2. Will continue to monitor for any signs/symptoms of bleeding and will follow up with PT/INR in the a.m.  Thank you for allowing pharmacy to be a part of this patient's care.  Alycia Rossetti, PharmD, BCPS Clinical Pharmacist Pager: [phone] Clinical phone for 12/10/2017 from 7a-3:30p: [phone] If after 3:30p, please call main pharmacy at: [phone] 12/10/2017 10:17 AM

## 2017-12-11 ENCOUNTER — Inpatient Hospital Stay (HOSPITAL_COMMUNITY)
Admission: RE | Admit: 2017-12-11 | Discharge: 2017-12-20 | DRG: 559 | Disposition: A | Discharge status: Home Health | Payer: Medicare Other | Source: Intra-hospital | Attending: Physical Medicine & Rehabilitation | Admitting: Physical Medicine & Rehabilitation

## 2017-12-11 DIAGNOSIS — I5042 Chronic combined systolic (congestive) and diastolic (congestive) heart failure: Secondary | ICD-10-CM | POA: Diagnosis present

## 2017-12-11 DIAGNOSIS — S88111D Complete traumatic amputation at level between knee and ankle, right lower leg, subsequent encounter: Secondary | ICD-10-CM | POA: Diagnosis not present

## 2017-12-11 DIAGNOSIS — Z4781 Encounter for orthopedic aftercare following surgical amputation: Principal | ICD-10-CM

## 2017-12-11 DIAGNOSIS — I10 Essential (primary) hypertension: Secondary | ICD-10-CM

## 2017-12-11 DIAGNOSIS — I132 Hypertensive heart and chronic kidney disease with heart failure and with stage 5 chronic kidney disease, or end stage renal disease: Secondary | ICD-10-CM | POA: Diagnosis present

## 2017-12-11 DIAGNOSIS — E1122 Type 2 diabetes mellitus with diabetic chronic kidney disease: Secondary | ICD-10-CM | POA: Diagnosis present

## 2017-12-11 DIAGNOSIS — I471 Supraventricular tachycardia: Secondary | ICD-10-CM | POA: Diagnosis present

## 2017-12-11 DIAGNOSIS — Z7901 Long term (current) use of anticoagulants: Secondary | ICD-10-CM | POA: Diagnosis not present

## 2017-12-11 DIAGNOSIS — Z794 Long term (current) use of insulin: Secondary | ICD-10-CM

## 2017-12-11 DIAGNOSIS — D631 Anemia in chronic kidney disease: Secondary | ICD-10-CM | POA: Diagnosis present

## 2017-12-11 DIAGNOSIS — N186 End stage renal disease: Secondary | ICD-10-CM | POA: Diagnosis present

## 2017-12-11 DIAGNOSIS — E118 Type 2 diabetes mellitus with unspecified complications: Secondary | ICD-10-CM | POA: Diagnosis not present

## 2017-12-11 DIAGNOSIS — R195 Other fecal abnormalities: Secondary | ICD-10-CM | POA: Diagnosis not present

## 2017-12-11 DIAGNOSIS — Z79899 Other long term (current) drug therapy: Secondary | ICD-10-CM | POA: Diagnosis not present

## 2017-12-11 DIAGNOSIS — Z89512 Acquired absence of left leg below knee: Secondary | ICD-10-CM

## 2017-12-11 DIAGNOSIS — E1142 Type 2 diabetes mellitus with diabetic polyneuropathy: Secondary | ICD-10-CM | POA: Diagnosis present

## 2017-12-11 DIAGNOSIS — R791 Abnormal coagulation profile: Secondary | ICD-10-CM | POA: Diagnosis not present

## 2017-12-11 DIAGNOSIS — B20 Human immunodeficiency virus [HIV] disease: Secondary | ICD-10-CM | POA: Diagnosis present

## 2017-12-11 DIAGNOSIS — Z992 Dependence on renal dialysis: Secondary | ICD-10-CM

## 2017-12-11 DIAGNOSIS — W19XXXA Unspecified fall, initial encounter: Secondary | ICD-10-CM

## 2017-12-11 DIAGNOSIS — Z89511 Acquired absence of right leg below knee: Secondary | ICD-10-CM

## 2017-12-11 DIAGNOSIS — N2581 Secondary hyperparathyroidism of renal origin: Secondary | ICD-10-CM | POA: Diagnosis present

## 2017-12-11 LAB — CBC
HEMATOCRIT: 26.5 % — AB (ref 39.0–52.0)
HEMOGLOBIN: 8.3 g/dL — AB (ref 13.0–17.0)
MCH: 28.1 pg (ref 26.0–34.0)
MCHC: 31.3 g/dL (ref 30.0–36.0)
MCV: 89.8 fL (ref 78.0–100.0)
Platelets: 362 10*3/uL (ref 150–400)
RBC: 2.95 MIL/uL — ABNORMAL LOW (ref 4.22–5.81)
RDW: 19.4 % — ABNORMAL HIGH (ref 11.5–15.5)
WBC: 9.9 10*3/uL (ref 4.0–10.5)

## 2017-12-11 LAB — GLUCOSE, CAPILLARY
GLUCOSE-CAPILLARY: 150 mg/dL — AB (ref 65–99)
GLUCOSE-CAPILLARY: 202 mg/dL — AB (ref 65–99)
Glucose-Capillary: 154 mg/dL — ABNORMAL HIGH (ref 65–99)
Glucose-Capillary: 144 mg/dL — ABNORMAL HIGH (ref 65–99)

## 2017-12-11 LAB — RENAL FUNCTION PANEL
Albumin: 2.2 g/dL — ABNORMAL LOW (ref 3.5–5.0)
Anion gap: 16 — ABNORMAL HIGH (ref 5–15)
BUN: 49 mg/dL — ABNORMAL HIGH (ref 6–20)
CO2: 25 mmol/L (ref 22–32)
Calcium: 9.8 mg/dL (ref 8.9–10.3)
Chloride: 95 mmol/L — ABNORMAL LOW (ref 101–111)
Creatinine, Ser: 9.66 mg/dL — ABNORMAL HIGH (ref 0.61–1.24)
GFR calc Af Amer: 6 mL/min — ABNORMAL LOW (ref >60)
GFR calc non Af Amer: 5 mL/min — ABNORMAL LOW (ref >60)
Glucose, Bld: 177 mg/dL — ABNORMAL HIGH (ref 65–99)
Phosphorus: 7.9 mg/dL — ABNORMAL HIGH (ref 2.5–4.6)
Potassium: 3.4 mmol/L — ABNORMAL LOW (ref 3.5–5.1)
Sodium: 136 mmol/L (ref 135–145)

## 2017-12-11 LAB — PROTIME-INR
INR: 1.44
PROTHROMBIN TIME: 17.4 s — AB (ref 11.4–15.2)

## 2017-12-11 MED ORDER — INSULIN ASPART 100 UNIT/ML ~~LOC~~ SOLN
0.0000 [IU] | Freq: Three times a day (TID) | SUBCUTANEOUS | Status: DC
  Administered 2017-12-12 (×2): 4 [IU] via SUBCUTANEOUS
  Administered 2017-12-12: 3 [IU] via SUBCUTANEOUS
  Administered 2017-12-13 (×3): 7 [IU] via SUBCUTANEOUS
  Administered 2017-12-14: 4 [IU] via SUBCUTANEOUS
  Administered 2017-12-14: 15 [IU] via SUBCUTANEOUS
  Administered 2017-12-15 – 2017-12-17 (×4): 3 [IU] via SUBCUTANEOUS
  Administered 2017-12-17: 7 [IU] via SUBCUTANEOUS
  Administered 2017-12-18: 3 [IU] via SUBCUTANEOUS
  Administered 2017-12-19: 4 [IU] via SUBCUTANEOUS
  Administered 2017-12-20: 3 [IU] via SUBCUTANEOUS

## 2017-12-11 MED ORDER — OXYCODONE HCL 5 MG PO TABS
10.0000 mg | ORAL_TABLET | ORAL | Status: DC | PRN
  Administered 2017-12-11 – 2017-12-20 (×14): 10 mg via ORAL
  Filled 2017-12-11 (×13): qty 2

## 2017-12-11 MED ORDER — RITONAVIR 100 MG PO CAPS
100.0000 mg | ORAL_CAPSULE | Freq: Every day | ORAL | Status: DC
  Administered 2017-12-12 – 2017-12-20 (×9): 100 mg via ORAL
  Filled 2017-12-11 (×9): qty 1

## 2017-12-11 MED ORDER — FOSAMPRENAVIR CALCIUM 700 MG PO TABS
700.0000 mg | ORAL_TABLET | Freq: Two times a day (BID) | ORAL | Status: DC
  Administered 2017-12-11 – 2017-12-20 (×18): 700 mg via ORAL
  Filled 2017-12-11 (×19): qty 1

## 2017-12-11 MED ORDER — WARFARIN - PHARMACIST DOSING INPATIENT
Freq: Every day | Status: DC
  Administered 2017-12-16 – 2017-12-17 (×2)

## 2017-12-11 MED ORDER — DARBEPOETIN ALFA 200 MCG/0.4ML IJ SOSY
200.0000 ug | PREFILLED_SYRINGE | INTRAMUSCULAR | Status: DC
  Administered 2017-12-13 – 2017-12-20 (×2): 200 ug via INTRAVENOUS
  Filled 2017-12-11 (×2): qty 0.4

## 2017-12-11 MED ORDER — HEPARIN SODIUM (PORCINE) 1000 UNIT/ML DIALYSIS: 1000.0000 [IU] | INTRAMUSCULAR | Status: DC | PRN

## 2017-12-11 MED ORDER — OXYCODONE HCL 5 MG PO TABS: 5.0000 mg | ORAL_TABLET | ORAL | 0 refills | Status: DC | PRN

## 2017-12-11 MED ORDER — RALTEGRAVIR POTASSIUM 400 MG PO TABS
400.0000 mg | ORAL_TABLET | Freq: Two times a day (BID) | ORAL | Status: DC
  Administered 2017-12-11 – 2017-12-20 (×18): 400 mg via ORAL
  Filled 2017-12-11 (×18): qty 1

## 2017-12-11 MED ORDER — NEPRO/CARBSTEADY PO LIQD
237.0000 mL | Freq: Two times a day (BID) | ORAL | Status: DC
  Administered 2017-12-12 – 2017-12-20 (×17): 237 mL via ORAL

## 2017-12-11 MED ORDER — BLISTEX MEDICATED EX OINT
TOPICAL_OINTMENT | CUTANEOUS | Status: DC | PRN
Start: 2017-12-11 — End: 2017-12-20
  Filled 2017-12-11: qty 6.3

## 2017-12-11 MED ORDER — ONDANSETRON HCL 4 MG/2ML IJ SOLN: 4.0000 mg | Freq: Four times a day (QID) | INTRAMUSCULAR | Status: DC | PRN

## 2017-12-11 MED ORDER — LIDOCAINE-PRILOCAINE 2.5-2.5 % EX CREA: 1.0000 "application " | TOPICAL_CREAM | CUTANEOUS | Status: DC | PRN

## 2017-12-11 MED ORDER — INSULIN ASPART 100 UNIT/ML ~~LOC~~ SOLN
4.0000 [IU] | Freq: Three times a day (TID) | SUBCUTANEOUS | Status: DC
  Administered 2017-12-12 – 2017-12-14 (×6): 4 [IU] via SUBCUTANEOUS

## 2017-12-11 MED ORDER — WARFARIN SODIUM 7.5 MG PO TABS
7.5000 mg | ORAL_TABLET | Freq: Once | ORAL | Status: AC
  Administered 2017-12-12: 7.5 mg via ORAL
  Filled 2017-12-11: qty 1

## 2017-12-11 MED ORDER — SODIUM CHLORIDE 0.9 % IV SOLN: 100.0000 mL | INTRAVENOUS | Status: DC | PRN

## 2017-12-11 MED ORDER — PENTAFLUOROPROP-TETRAFLUOROETH EX AERO: 1.0000 "application " | INHALATION_SPRAY | CUTANEOUS | Status: DC | PRN

## 2017-12-11 MED ORDER — DOXERCALCIFEROL 4 MCG/2ML IV SOLN
5.0000 ug | INTRAVENOUS | Status: DC
  Administered 2017-12-13 – 2017-12-18 (×3): 5 ug via INTRAVENOUS
  Filled 2017-12-11 (×4): qty 4

## 2017-12-11 MED ORDER — OXYCODONE HCL 10 MG PO TABS: 10.0000 mg | ORAL_TABLET | ORAL | 0 refills | Status: DC | PRN

## 2017-12-11 MED ORDER — CAMPHOR-MENTHOL 0.5-0.5 % EX LOTN
TOPICAL_LOTION | CUTANEOUS | Status: DC | PRN
  Filled 2017-12-11 (×2): qty 222

## 2017-12-11 MED ORDER — LISINOPRIL 20 MG PO TABS
40.0000 mg | ORAL_TABLET | Freq: Every day | ORAL | Status: DC
  Administered 2017-12-12 – 2017-12-20 (×8): 40 mg via ORAL
  Filled 2017-12-11 (×10): qty 2

## 2017-12-11 MED ORDER — SORBITOL 70 % SOLN: 30.0000 mL | Freq: Every day | Status: DC | PRN

## 2017-12-11 MED ORDER — INSULIN GLARGINE 100 UNIT/ML ~~LOC~~ SOLN
25.0000 [IU] | Freq: Every day | SUBCUTANEOUS | Status: DC
  Administered 2017-12-11 – 2017-12-12 (×2): 25 [IU] via SUBCUTANEOUS
  Filled 2017-12-11 (×2): qty 0.25

## 2017-12-11 MED ORDER — AMLODIPINE BESYLATE 5 MG PO TABS
5.0000 mg | ORAL_TABLET | Freq: Every evening | ORAL | Status: DC
  Administered 2017-12-12 – 2017-12-19 (×7): 5 mg via ORAL
  Filled 2017-12-11 (×8): qty 1

## 2017-12-11 MED ORDER — LIDOCAINE HCL (PF) 1 % IJ SOLN: 5.0000 mL | INTRAMUSCULAR | Status: DC | PRN

## 2017-12-11 MED ORDER — WARFARIN SODIUM 7.5 MG PO TABS
7.5000 mg | ORAL_TABLET | Freq: Once | ORAL | Status: AC
  Administered 2017-12-11: 7.5 mg via ORAL
  Filled 2017-12-11 (×2): qty 1

## 2017-12-11 MED ORDER — ALTEPLASE 2 MG IJ SOLR: 2.0000 mg | Freq: Once | INTRAMUSCULAR | Status: DC | PRN

## 2017-12-11 MED ORDER — DIPHENHYDRAMINE HCL 25 MG PO CAPS
25.0000 mg | ORAL_CAPSULE | Freq: Once | ORAL | Status: AC
  Administered 2017-12-11: 25 mg via ORAL
  Filled 2017-12-11: qty 1

## 2017-12-11 MED ORDER — DIPHENHYDRAMINE HCL 25 MG PO CAPS: 25.0000 mg | ORAL_CAPSULE | Freq: Three times a day (TID) | ORAL | Status: DC | PRN

## 2017-12-11 MED ORDER — ACETAMINOPHEN 325 MG PO TABS
650.0000 mg | ORAL_TABLET | ORAL | Status: DC | PRN
  Administered 2017-12-13 (×2): 650 mg via ORAL
  Filled 2017-12-11: qty 2

## 2017-12-11 MED ORDER — ONDANSETRON HCL 4 MG PO TABS: 4.0000 mg | ORAL_TABLET | Freq: Four times a day (QID) | ORAL | Status: DC | PRN

## 2017-12-11 MED ORDER — CAMPHOR-MENTHOL 0.5-0.5 % EX LOTN: TOPICAL_LOTION | CUTANEOUS | 0 refills | Status: DC | PRN

## 2017-12-11 MED ORDER — DOXERCALCIFEROL 4 MCG/2ML IV SOLN
INTRAVENOUS | Status: AC
  Administered 2017-12-11: 5 ug via INTRAVENOUS
  Filled 2017-12-11: qty 4

## 2017-12-11 MED ORDER — METOPROLOL TARTRATE 25 MG PO TABS
25.0000 mg | ORAL_TABLET | Freq: Two times a day (BID) | ORAL | Status: DC
  Administered 2017-12-11 – 2017-12-19 (×15): 25 mg via ORAL
  Filled 2017-12-11 (×19): qty 1

## 2017-12-11 MED ORDER — ACETAMINOPHEN 650 MG RE SUPP
650.0000 mg | RECTAL | Status: DC | PRN
Start: 2017-12-11 — End: 2017-12-20

## 2017-12-11 MED ORDER — SODIUM CHLORIDE 0.9 % IV SOLN
125.0000 mg | INTRAVENOUS | Status: DC
  Administered 2017-12-18: 125 mg via INTRAVENOUS
  Filled 2017-12-11 (×2): qty 10

## 2017-12-11 MED ORDER — RENA-VITE PO TABS
1.0000 | ORAL_TABLET | Freq: Every day | ORAL | Status: DC
  Administered 2017-12-11 – 2017-12-19 (×9): 1 via ORAL
  Filled 2017-12-11 (×10): qty 1

## 2017-12-11 NOTE — Progress Notes (Signed)
Physical Medicine and Rehabilitation Consult Reason for Consult: Decreased functional mobility Referring Physician: Internal medicine   HPI: Thomas [name] is a 51 y.o. right handed male with history of diabetes mellitus, end-stage renal disease with hemodialysis, SVT and maintained on chronic Coumadin, HIV positive, diastolic congestive heart failure, left BKA 3 years ago in [place]. Per chart review and patient, patient lives with wife and mother-in-law. Used a left prosthesis provided by [employer] prior to admission. Family assistance provided as needed. Presented 12/05/2017 with nonhealing right foot wound. X-rays and imaging of right foot showed multifocal marrow edema in the calcaneus and in the distal 5 cm of the fibula consistent with osteomyelitis. Large skin ulceration along the lateral aspect of the ankle extending almost to the peroneal tendons. 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 Sharol Given and application of wound VAC. Hospital course pain management. Hemodialysis ongoing as per renal services. Noted acute on chronic anemia 6.8 he was transfused. Chronic Coumadin has been resumed. Physical therapy evaluation completed 12/08/2017 with recommendations of physical medicine rehabilitation consult.   Review of Systems  Constitutional: Negative for chills.  HENT: Negative for hearing loss.   Eyes: Negative for blurred vision and double vision.  Respiratory: Positive for shortness of breath. Negative for cough.   Gastrointestinal: Positive for constipation. Negative for nausea and vomiting.  Genitourinary: Negative for flank pain and hematuria.  Musculoskeletal: Positive for joint pain and myalgias.  Skin: Negative for rash.  All other systems reviewed and are negative.      Past Medical History:  Diagnosis Date  . Anemia   . Chronic combined systolic and diastolic CHF (congestive heart failure) ([place])   . Diabetes ([place])    . ESRD (end stage renal disease) [facility])    [facility] [address] T, Th, Sat  . HIV disease ([place])   . Hyperparathyroidism, secondary ([place])   . Hypertension   . Hypertensive heart disease with end stage renal disease on dialysis ([place]) 03/13/2017  . SVT (supraventricular tachycardia) ([place])    ? afib or atrial flutter s/p TEE/DCCV with subsequent ablation due to reoccurrence in [place]  . Type 2 diabetes mellitus ([place])         Past Surgical History:  Procedure Laterality Date  . AMPUTATION Left    foot  . AMPUTATION Right 12/06/2017   Procedure: AMPUTATION BELOW KNEE;  Surgeon: [name], MD;  Location: [place];  Service: Orthopedics;  Laterality: Right;  . APPLICATION OF WOUND VAC Right 09/04/2017   Procedure: APPLICATION OF WOUND VAC;  Surgeon: [name], DPM;  Location: [place];  Service: Podiatry;  Laterality: Right;  . APPLICATION OF WOUND VAC  12/06/2017   Procedure: APPLICATION OF WOUND VAC;  Surgeon: [name], MD;  Location: [place];  Service: Orthopedics;;  . COLONOSCOPY WITH PROPOFOL N/A 07/30/2017   Procedure: COLONOSCOPY WITH PROPOFOL;  Surgeon: [name], MD;  Location: [place];  Service: Endoscopy;  Laterality: N/A;  . ESOPHAGOGASTRODUODENOSCOPY (EGD) WITH PROPOFOL N/A 07/28/2017   Procedure: ESOPHAGOGASTRODUODENOSCOPY (EGD) WITH PROPOFOL;  Surgeon: [name], MD;  Location: [place];  Service: Endoscopy;  Laterality: N/A;  . FLEXIBLE SIGMOIDOSCOPY N/A 07/27/2017   Procedure: FLEXIBLE SIGMOIDOSCOPY;  Surgeon: [name], MD;  Location[facility] ENDOSCOPY;  Service: Endoscopy;  Laterality: N/A;  . GRAFT APPLICATION Right 93/23/5573   Procedure: SKIN GRAFT APPLICATION RIGHT FOOT;  Surgeon: [name], DPM;  Location: [place];  Service: Podiatry;  Laterality:  Right;  . I&D EXTREMITY Right 09/04/2017   Procedure: IRRIGATION AND DEBRIDEMENT EXTREMITY;  Surgeon: [name], DPM;  Location: [place];  Service: Podiatry;  Laterality: Right;   . I&D EXTREMITY Right 11/12/2017   Procedure: IRRIGATION AND DEBRIDEMENT ULCER RIGHT FOOT;  Surgeon: [name], DPM;  Location: [place];  Service: Podiatry;  Laterality: Right;  . RIGHT AV FISTULA PLACEMENT  10/19/2012  . WOUND DEBRIDEMENT N/A 09/24/2017   Procedure: DEBRIDEMENT WOUND;  Surgeon: [name], DPM;  Location: [place];  Service: Podiatry;  Laterality: N/A;        Family History  Problem Relation Age of Onset  . Heart disease Mother   . Diabetes Mother   . Heart disease Father   . Cancer Neg Hx    Social History:  reports that  has never smoked. he has never used smokeless tobacco. He reports that he does not drink alcohol or use drugs. Allergies: No Known Allergies       Medications Prior to Admission  Medication Sig Dispense Refill  . amLODipine (NORVASC) 5 MG tablet Take 1 tablet (5 mg total) by mouth daily. (Patient taking differently: Take 5 mg by mouth every evening. ) 30 tablet 0  . cinacalcet (SENSIPAR) 60 MG tablet Take 60 mg by mouth daily.    . clindamycin (CLEOCIN) 150 MG capsule Take 1 capsule (150 mg total) by mouth 3 (three) times daily. 21 capsule 0  . doxercalciferol (HECTOROL) 4 MCG/2ML injection Inject 2.5 mLs (5 mcg total) into the vein Every Tuesday,Thursday,and Saturday with dialysis. 2 mL   . ferric gluconate 125 mg in sodium chloride 0.9 % 100 mL Inject 125 mg into the vein Every Tuesday,Thursday,and Saturday with dialysis.    . fosamprenavir (LEXIVA) 700 MG tablet Take 700 mg by mouth 2 (two) times daily.     . insulin aspart (NOVOLOG) 100 UNIT/ML injection CBG 70 - 120: 0 units CBG 121 - 150: 3 units CBG 151 - 200: 4 units CBG 201 - 250: 7 units CBG 251 - 300: 11 units CBG 301 - 350: 15 units CBG 351 - 400: 20 units (Patient taking differently: Inject 0-20 Units into the skin 3 (three) times daily with meals. CBG 70 - 120: 0 units CBG 121 - 150: 3 units CBG 151 - 200: 4 units CBG 201 - 250: 7 units CBG 251 - 300: 11  units CBG 301 - 350: 15 units CBG 351 - 400: 20 units) 10 mL 11  . insulin glargine (LANTUS) 100 UNIT/ML injection Inject 0.3 mLs (30 Units total) into the skin daily at 10 pm. 10 mL 11  . lisinopril (PRINIVIL,ZESTRIL) 40 MG tablet Take 40 mg by mouth daily.    . metoprolol tartrate (LOPRESSOR) 25 MG tablet Take 1 tablet (25 mg total) by mouth 2 (two) times daily. 60 tablet 0  . Multiple Vitamin (TAB-A-VITE PO) Take 1 tablet by mouth daily.    . raltegravir (ISENTRESS) 400 MG tablet Take 400 mg by mouth 2 (two) times daily.    . ritonavir (NORVIR) 100 MG capsule Take 100 mg by mouth daily with breakfast.    . sevelamer carbonate (RENVELA) 800 MG tablet Take 2,400 mg by mouth 3 (three) times daily with meals.     . warfarin (COUMADIN) 10 MG tablet Take 10 mg by mouth See admin instructions. Take 10 mg by mouth daily Sunday, Monday, Wednesday and Friday    . warfarin (COUMADIN) 7.5 MG tablet Take 7.5 mg  by mouth See admin instructions. Take 7.5 mg by mouth daily on Tuesday, Thursday ans Saturday    . Soft Lens Products (SALINE) 0.9 % SOLN Apply to gauze as part of saline wet to dry dressing 1 Bottle 0    Home: Home Living Family/patient expects to be discharged to:: Private residence Living Arrangements: Spouse/significant other, Other relatives Available Help at Discharge: Family Type of Home: House Home Access: Ramped entrance Home Layout: One level Bathroom Shower/Tub: Tub/shower unit, Multimedia programmer: Standard Additional Comments: Pt is 6'6" tall, previously up with no AD  Functional History: Prior Function Level of Independence: Independent with assistive device(s) Comments: LLE prosthesis Functional Status:  Mobility: Bed Mobility Overal bed mobility: Needs Assistance Bed Mobility: Supine to Sit, Sit to Supine Supine to sit: Mod assist Sit to supine: Mod assist General bed mobility comments: help back to bed with RLE but assisted with LLE as  well to ease pain with the effort Transfers Overall transfer level: Needs assistance Equipment used: 1 person hand held assist Transfers: Lateral/Scoot Transfers  Lateral/Scoot Transfers: Min assist General transfer comment: declined to fully stand but can clear hips on bedside to scoot to [facility] Ambulation/Gait General Gait Details: not attempted due to pain complaints  ADL: ADL Overall ADL's : Needs assistance/impaired Eating/Feeding: Independent, Bed level Grooming: Set up, Bed level Upper Body Bathing: Set up, Bed level Lower Body Bathing: Maximal assistance, Bed level Upper Body Dressing : Moderate assistance, Bed level Lower Body Dressing: Total assistance, Bed level General ADL Comments: Pt limited in his basic ADLs in unsupported sitting due to pain with transitional movements and throbbing when residual limb is in dependent position  Cognition: Cognition Overall Cognitive Status: Within Functional Limits for tasks assessed Orientation Level: Oriented X4 Cognition Arousal/Alertness: Lethargic, Suspect due to medications Behavior During Therapy: Anxious(about causing pain on RLE) Overall Cognitive Status: Within Functional Limits for tasks assessed  Blood pressure 132/74, pulse (!) 102, temperature 98.9 F (37.2 C), temperature source Oral, resp. rate 20, height 6\' 6"  (1.981 m), weight 113.1 kg (249 lb 5.4 oz), SpO2 93 %. Physical Exam  Vitals reviewed. Constitutional: He is oriented to person, place, and time. He appears well-developed and well-nourished.  HENT:  Head: Normocephalic and atraumatic.  Eyes: EOM are normal. Right eye exhibits no discharge. Left eye exhibits no discharge.  Neck: Normal range of motion. Neck supple. No thyromegaly present.  Cardiovascular: Regular rhythm.  +Tachycardia  Respiratory: Effort normal and breath sounds normal. No respiratory distress.  GI: Soft. Bowel sounds are normal. He exhibits no distension.  Musculoskeletal:  RLE  tenderness  Neurological: He is alert and oriented to person, place, and time.  Motor: B/l UE, LLE HF, KE 5/5 LLE: Not willing to move due to pain  Skin:  Left BKA is well healed. Right BKA is dressed with wound VAC  Psychiatric: He has a normal mood and affect. His behavior is normal.    LabResultsLast24Hours       Results for orders placed or performed during the hospital encounter of 12/05/17 (from the past 24 hour(s))  Prepare RBC     Status: None   Collection Time: 12/08/17  6:57 AM  Result Value Ref Range   Order Confirmation ORDER PROCESSED BY BLOOD BANK   Glucose, capillary     Status: Abnormal   Collection Time: 12/08/17  7:23 AM  Result Value Ref Range   Glucose-Capillary 304 (H) 65 - 99 mg/dL  Glucose, capillary     Status: Abnormal  Collection Time: 12/08/17 11:55 AM  Result Value Ref Range   Glucose-Capillary 233 (H) 65 - 99 mg/dL  Hemoglobin and hematocrit, blood     Status: Abnormal   Collection Time: 12/08/17  1:45 PM  Result Value Ref Range   Hemoglobin 7.3 (L) 13.0 - 17.0 g/dL   HCT 23.3 (L) 39.0 - 52.0 %  Glucose, capillary     Status: Abnormal   Collection Time: 12/08/17  5:06 PM  Result Value Ref Range   Glucose-Capillary 197 (H) 65 - 99 mg/dL  Glucose, capillary     Status: Abnormal   Collection Time: 12/08/17 10:20 PM  Result Value Ref Range   Glucose-Capillary 198 (H) 65 - 99 mg/dL  CBC     Status: Abnormal   Collection Time: 12/09/17  5:37 AM  Result Value Ref Range   WBC 12.8 (H) 4.0 - 10.5 K/uL   RBC 2.52 (L) 4.22 - 5.81 MIL/uL   Hemoglobin 7.2 (L) 13.0 - 17.0 g/dL   HCT 23.1 (L) 39.0 - 52.0 %   MCV 91.7 78.0 - 100.0 fL   MCH 28.6 26.0 - 34.0 pg   MCHC 31.2 30.0 - 36.0 g/dL   RDW 17.7 (H) 11.5 - 15.5 %   Platelets 289 150 - 400 K/uL     ImagingResults(Last48hours)  No results found.    Assessment/Plan: Diagnosis: Right BKA with history of left BKA Labs and images independently reviewed.  Records  reviewed and summated above. Clean amputation daily with soap and water Monitor incision site for signs of infection or impending skin breakdown. Staples to remain in place for 3-4 weeks Stump shrinker, for edema control  Scar mobilization massaging to prevent soft tissue adherence Stump protector during therapies Prevent flexion contractures by implementing the following:              Encourage prone lying for 20-30 mins per day BID to avoid hip flexion       Contractures if medically appropriate;             Avoid pillow under knees when patient is lying in bed in order to prevent both        knee and hip flexion contractures;             Avoid prolonged sitting Post surgical pain control with oral medication Phantom limb pain control with physical modalities including desensitization techniques (gentle self massage to the residual stump,hot packs if sensation intact, [place]) and mirror therapy, TENS. If ineffective, consider pharmacological treatment for neuropathic pain (e.g gabapentin, pregabalin, amytriptalyine, duloxetine).  When using wheelchair, patient should have knee on amputated side fully extended with board under the seat cushion.  1. Does the need for close, 24 hr/day medical supervision in concert with the patient's rehab needs make it unreasonable for this patient to be served in a less intensive setting? Yes  2. Co-Morbidities requiring supervision/potential complications: diabetes mellitus (Monitor in accordance with exercise and adjust meds as necessary), end-stage renal disease with hemodialysis (recs per Nephro), SVT (cont meds, monitor with increased mobility), HIV positive, diastolic congestive heart failure (monitor for signs/symptoms of fluid overload), left BKA, post-op pain management (Biofeedback training with therapies to help reduce reliance on opiate pain medications, particularly IV dilaudid, monitor pain control during therapies, and sedation at rest and titrate to  maximum efficacy to ensure participation and gains in therapies), ABLA on anemia of chronic disease (transfuse again if necessary to ensure appropriate perfusion for increased activity tolerance), leukocytosis (cont to  monitor for signs and symptoms of infection, further workup if indicated), supplemental oxygen dependent 3. Due to safety, skin/wound care, disease management, pain management and patient education, does the patient require 24 hr/day rehab nursing? Yes 4. Does the patient require coordinated care of a physician, rehab nurse, PT (1-2 hrs/day, 5 days/week) and OT (1-2 hrs/day, 5 days/week) to address physical and functional deficits in the context of the above medical diagnosis(es)? Yes Addressing deficits in the following areas: balance, endurance, locomotion, strength, transferring, bathing, dressing, toileting and psychosocial support 5. Can the patient actively participate in an intensive therapy program of at least 3 hrs of therapy per day at least 5 days per week? Yes 6. The potential for patient to make measurable gains while on inpatient rehab is excellent 7. Anticipated functional outcomes upon discharge from inpatient rehab are Supervision/Mod I at wheelchair level  with PT, Supervision/Mod I at wheelchair level with OT, n/a with SLP. 8. Estimated rehab length of stay to reach the above functional goals is: 7-12 days. 9. Anticipated D/C setting: Home 10. Anticipated post D/C treatments: HH therapy and Home excercise program 11. Overall Rehab/Functional Prognosis: good  RECOMMENDATIONS: This patient's condition is appropriate for continued rehabilitative care in the following setting: CIR once able to tolerate 3 hours of therapy/day Patient has agreed to participate in recommended program. Yes Note that insurance prior authorization may be required for reimbursement for recommended care.  Comment: Rehab Admissions Coordinator to follow up.  Delice Lesch, MD, ABPMR Lavon Paganini  Angiulli, PA-C 12/09/2017          Revision History

## 2017-12-11 NOTE — Progress Notes (Signed)
Subjective:  Reports RLE pain is improved. Denies chest pain or shortness of breath. Had 1 episode of NB diarrhea this morning, but this is improved from yesterday. Denies abdominal pain or fevers.  Objective:  Vital signs in last 24 hours: Vitals:   12/10/17 1805 12/10/17 2113 12/11/17 0232 12/11/17 0530  BP: 116/71 130/69 110/63 126/75  Pulse: 83 94 83 86  Resp: 18 18  18   Temp: 98.9 F (37.2 C) 99.1 F (37.3 C)  99 F (37.2 C)  TempSrc: Oral Oral  Oral  SpO2: 92% 94%  96%  Weight:  257 lb 15 oz (117 kg)    Height:       GEN: Obese, pleasant male lying in bed. Alert and oriented. RESP: Clear to auscultation bilaterally. No wheezes, rales, or rhonchi. No increased work of breathing. CV: Normal rate and regular rhythm. No murmurs, gallops, or rubs. No LE edema. ABD: Soft. Non-tender. Non-distended. Normoactive bowel sounds. EXT: Warm. S/p R BKA with wound vac and compressive sleeve in place as well as L BKA. NEURO: Cranial nerves II-XII grossly intact. Able to lift all four extremities against gravity. No apparent audiovisual hallucinations. Speech fluent and appropriate. PSYCH: Patient is calm and pleasant. Appropriate affect. Well-groomed; speech is appropriate and on-subject.  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 Midgley is a 51yo male with PMH significant for ESRD on HD TuThSa, HIV, DM2, ?afib/flutter s/p ablation (on warfarin), left BKA secondary to gangrene, and CHF who presents with osteomyelitis of right ankle and foot, now s/p transtibial amputation on 12/8.  NB diarrhea Improved. Patient afebrile with stable vital signs. No abdominal pain or N/V. Tolerating PO intake. 1 loose BM this morning. - Continue to monitor  Osteomyelitis of right ankle and foot S/p transtibial amputation on 12/8 with Ortho. Vanc/zosyn continued for 24h post-op, now off. Has Wound VAC in place. BCx negative. Afebrile. CD4 count 490. Patient amenable to discharge to [facility] following; appreciate their assistance - Non-weight bearing on right - Wound VAC x1 week - Oxy IR 5-10mg  q3h PRN for pain control - F/u with Ortho outpt 1 week post-op - Continue to monitor fever curve - PT/OT eval, recommending CIR - Discharge to CIR today  Acute blood loss anemia Hb stable. No signs of GI bleed. - Home warfarin resumed  ESRD with HD TuThSa - Nephrology following, appreciate their assistance - Discharge to CIR with plan for typical HD schedule - HD today  DM2 Home regimen includes 30u Lantus QHS and SSI. - CBG monitoring - Lantus 25u QHS + Novolog 4u TID + SSI  HTN, CHF, ?afib/flutter Home  BP regimen includes amlodipine 5mg  qday, lisinopril 40mg  qday, and metoprolol 25mg  BID. On warfarin for anticoagulation. CHADS2-VASc score of at least 3, which gives him 4.6% annual risk of stroke. - Telemetry - Continue home amlodipine, lisinopril, and metoprolol - Home warfarin resumed - INR checks  HIV CD4 count 490 - Continue home ritonavir, fosamprenavir, and raltegravir  Dispo: Anticipated discharge today  [place]bert Ewing, MD 12/11/2017, 8:48 AM Pager: Mamie Nick [phone]

## 2017-12-11 NOTE — H&P (Signed)
Physical Medicine and Rehabilitation Admission H&P        Chief Complaint  Patient presents with  . Wound Infection  : HPI: Thomas [name] is a 51 y.o. right handed male with history of diabetes mellitus, end-stage renal disease with hemodialysis, SVT and maintained on chronic Coumadin, HIV positive, diastolic congestive heart failure, left BKA 3 years ago in [place]. Per chart review and patient, patient lives with wife and mother-in-law. Used a left prosthesis provided by [employer] prior to admission. Family assistance provided as needed. Presented 12/05/2017 with nonhealing right foot wound. X-rays and imaging of right foot showed multifocal marrow edema in the calcaneus and in the distal 5 cm of the fibula consistent with osteomyelitis. Large skin ulceration along the lateral aspect of the ankle extending almost to the peroneal tendons. 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 Sharol Given and application of wound VAC. Hospital course pain management. Hemodialysis ongoing as per renal services. Noted acute on chronic anemia 6.8 he was transfused. Chronic Coumadin has been resumed. Physical and Occupational therapy evaluations completed 12/08/2017 with recommendations of physical medicine rehabilitation consult. Patient was admitted for a comprehensive rehabilitation program       Review of Systems  Constitutional: Negative for chills and fever.  HENT: Negative for hearing loss.   Eyes: Negative for blurred vision and double vision.  Respiratory: Positive for shortness of breath.   Cardiovascular: Negative for chest pain.  Gastrointestinal: Positive for constipation. Negative for nausea and vomiting.  Genitourinary: Negative for flank pain and hematuria.  Musculoskeletal: Positive for joint pain.  Skin: Negative for rash.  Neurological: Negative for seizures.  All other systems reviewed and are negative.       Past Medical History:   Diagnosis Date  . Anemia    . Chronic combined systolic and diastolic CHF (congestive heart failure) ([place])    . Diabetes ([place])    . ESRD (end stage renal disease) [facility])      [facility] [address] T, Th, Sat  . HIV disease ([place])    . Hyperparathyroidism, secondary ([place])    . Hypertension    . Hypertensive heart disease with end stage renal disease on dialysis ([place]) 03/13/2017  . SVT (supraventricular tachycardia) ([place])      ? afib or atrial flutter s/p TEE/DCCV with subsequent ablation due to reoccurrence in [place]  . Type 2 diabetes mellitus ([place])           Past Surgical History:  Procedure Laterality Date  . AMPUTATION Left      foot  . AMPUTATION Right 12/06/2017    Procedure: AMPUTATION BELOW KNEE;  Surgeon: [name], MD;  Location: [place];  Service: Orthopedics;  Laterality: Right;  . APPLICATION OF WOUND VAC Right 09/04/2017    Procedure: APPLICATION OF WOUND VAC;  Surgeon: [name], DPM;  Location: [place];  Service: Podiatry;  Laterality: Right;  . APPLICATION OF WOUND VAC   12/06/2017    Procedure: APPLICATION OF WOUND VAC;  Surgeon: [name], MD;  Location: [place];  Service: Orthopedics;;  . COLONOSCOPY WITH PROPOFOL N/A 07/30/2017    Procedure: COLONOSCOPY WITH PROPOFOL;  Surgeon: [name], MD;  Location: [place];  Service: Endoscopy;  Laterality: N/A;  . ESOPHAGOGASTRODUODENOSCOPY (EGD) WITH PROPOFOL N/A 07/28/2017    Procedure: ESOPHAGOGASTRODUODENOSCOPY (EGD) WITH PROPOFOL;  Surgeon: [name], MD;  Location: [place];  Service: Endoscopy;  Laterality: N/A;  . FLEXIBLE SIGMOIDOSCOPY  N/A 07/27/2017    Procedure: FLEXIBLE SIGMOIDOSCOPY;  Surgeon: [name], MD;  Location[facility] ENDOSCOPY;  Service: Endoscopy;  Laterality: N/A;  . GRAFT APPLICATION Right [id number]    Procedure: SKIN GRAFT APPLICATION RIGHT FOOT;  Surgeon: [name], DPM;  Location: [place];  Service: Podiatry;  Laterality: Right;  . I&D EXTREMITY Right 09/04/2017     Procedure: IRRIGATION AND DEBRIDEMENT EXTREMITY;  Surgeon: [name], DPM;  Location: [place];  Service: Podiatry;  Laterality: Right;  . I&D EXTREMITY Right 11/12/2017    Procedure: IRRIGATION AND DEBRIDEMENT ULCER RIGHT FOOT;  Surgeon: [name], DPM;  Location: [place];  Service: Podiatry;  Laterality: Right;  . RIGHT AV FISTULA PLACEMENT   10/19/2012  . WOUND DEBRIDEMENT N/A 09/24/2017    Procedure: DEBRIDEMENT WOUND;  Surgeon: [name], DPM;  Location: [place];  Service: Podiatry;  Laterality: N/A;         Family History  Problem Relation Age of Onset  . Heart disease Mother    . Diabetes Mother    . Heart disease Father    . Cancer Neg Hx      Social History:  reports that  has never smoked. he has never used smokeless tobacco. He reports that he does not drink alcohol or use drugs. Allergies: No Known Allergies Medications Prior to Admission  Medication Sig Dispense Refill  . amLODipine (NORVASC) 5 MG tablet Take 1 tablet (5 mg total) by mouth daily. (Patient taking differently: Take 5 mg by mouth every evening. ) 30 tablet 0  . cinacalcet (SENSIPAR) 60 MG tablet Take 60 mg by mouth daily.      . clindamycin (CLEOCIN) 150 MG capsule Take 1 capsule (150 mg total) by mouth 3 (three) times daily. 21 capsule 0  . doxercalciferol (HECTOROL) 4 MCG/2ML injection Inject 2.5 mLs (5 mcg total) into the vein Every Tuesday,Thursday,and Saturday with dialysis. 2 mL    . ferric gluconate 125 mg in sodium chloride 0.9 % 100 mL Inject 125 mg into the vein Every Tuesday,Thursday,and Saturday with dialysis.      . fosamprenavir (LEXIVA) 700 MG tablet Take 700 mg by mouth 2 (two) times daily.       . insulin aspart (NOVOLOG) 100 UNIT/ML injection CBG 70 - 120: 0 units CBG 121 - 150: 3 units CBG 151 - 200: 4 units CBG 201 - 250: 7 units CBG 251 - 300: 11 units CBG 301 - 350: 15 units CBG 351 - 400: 20 units (Patient taking differently: Inject 0-20 Units into the skin 3 (three) times  daily with meals. CBG 70 - 120: 0 units CBG 121 - 150: 3 units CBG 151 - 200: 4 units CBG 201 - 250: 7 units CBG 251 - 300: 11 units CBG 301 - 350: 15 units CBG 351 - 400: 20 units) 10 mL 11  . insulin glargine (LANTUS) 100 UNIT/ML injection Inject 0.3 mLs (30 Units total) into the skin daily at 10 pm. 10 mL 11  . lisinopril (PRINIVIL,ZESTRIL) 40 MG tablet Take 40 mg by mouth daily.      . metoprolol tartrate (LOPRESSOR) 25 MG tablet Take 1 tablet (25 mg total) by mouth 2 (two) times daily. 60 tablet 0  . Multiple Vitamin (TAB-A-VITE PO) Take 1 tablet by mouth daily.      . raltegravir (ISENTRESS) 400 MG tablet Take 400 mg by mouth 2 (two) times daily.      . ritonavir (NORVIR) 100 MG capsule  Take 100 mg by mouth daily with breakfast.      . sevelamer carbonate (RENVELA) 800 MG tablet Take 2,400 mg by mouth 3 (three) times daily with meals.       . warfarin (COUMADIN) 10 MG tablet Take 10 mg by mouth See admin instructions. Take 10 mg by mouth daily Sunday, Monday, Wednesday and Friday      . warfarin (COUMADIN) 7.5 MG tablet Take 7.5 mg by mouth See admin instructions. Take 7.5 mg by mouth daily on Tuesday, Thursday ans Saturday      . Soft Lens Products (SALINE) 0.9 % SOLN Apply to gauze as part of saline wet to dry dressing 1 Bottle 0      Drug Regimen Review Drug regimen was reviewed and remains appropriate with no significant issues identified   Home: Home Living Family/patient expects to be discharged to:: Private residence Living Arrangements: Spouse/significant other, Other relatives Available Help at Discharge: Family Type of Home: House Home Access: Ramped entrance Home Layout: One level Bathroom Shower/Tub: Tub/shower unit, Multimedia programmer: Standard Additional Comments: Pt is 6'6" tall, previously up with no AD   Functional History: Prior Function Level of Independence: Independent with assistive device(s) Comments: LLE prosthesis   Functional Status:   Mobility: Bed Mobility Overal bed mobility: Needs Assistance Bed Mobility: Supine to Sit, Sit to Supine Supine to sit: Mod assist Sit to supine: Mod assist General bed mobility comments: help back to bed with RLE but assisted with LLE as well to ease pain with the effort Transfers Overall transfer level: Needs assistance Equipment used: 1 person hand held assist Transfers: Lateral/Scoot Transfers  Lateral/Scoot Transfers: Min assist General transfer comment: declined to fully stand but can clear hips on bedside to scoot to [facility] Ambulation/Gait General Gait Details: not attempted due to pain complaints   ADL: ADL Overall ADL's : Needs assistance/impaired Eating/Feeding: Independent, Bed level Grooming: Set up, Bed level Upper Body Bathing: Set up, Bed level Lower Body Bathing: Maximal assistance, Bed level Upper Body Dressing : Moderate assistance, Bed level Lower Body Dressing: Total assistance, Bed level General ADL Comments: Pt limited in his basic ADLs in unsupported sitting due to pain with transitional movements and throbbing when residual limb is in dependent position   Cognition: Cognition Overall Cognitive Status: Within Functional Limits for tasks assessed Orientation Level: Oriented X4 Cognition Arousal/Alertness: Lethargic, Suspect due to medications Behavior During Therapy: Anxious(about causing pain on RLE) Overall Cognitive Status: Within Functional Limits for tasks assessed   Physical Exam: Blood pressure 115/80, pulse 88, temperature 98.1 F (36.7 C), temperature source Oral, resp. rate 19, height _0  (1.981 m), weight 117 kg (257 lb 15 oz), SpO2 100 %. Physical Exam  Vitals reviewed. Constitutional: He appears well-developed.  HENT:  Head: Normocephalic.  Eyes: EOM are normal.  Neck: Normal range of motion. Neck supple. No thyromegaly present.  Cardiovascular:  Rate controlled  Respiratory: Effort normal and breath sounds normal. No respiratory  distress.  GI: Soft. Bowel sounds are normal. He exhibits no distension.  Skin. Amputation site is dressed appropriately tender. Wound VAC in place to right BKA. Old left BKA is well healed Neurological: He is alert and oriented to person, place, and time.  Motor: 5/5 in BUE, 5/5 Left HF, 3/5 R HF limited by pain.  Hand intrinsic atrophy bilaterally         Lab Results Last 48 Hours        Results for orders placed or performed during the  hospital encounter of 12/05/17 (from the past 48 hour(s))  Protime-INR     Status: Abnormal    Collection Time: 12/07/17  3:18 PM  Result Value Ref Range    Prothrombin Time 20.2 (H) 11.4 - 15.2 seconds    INR 1.74    Glucose, capillary     Status: None    Collection Time: 12/07/17  4:33 PM  Result Value Ref Range    Glucose-Capillary 98 65 - 99 mg/dL  Glucose, capillary     Status: Abnormal    Collection Time: 12/07/17  9:21 PM  Result Value Ref Range    Glucose-Capillary 148 (H) 65 - 99 mg/dL  Basic metabolic panel     Status: Abnormal    Collection Time: 12/08/17  5:31 AM  Result Value Ref Range    Sodium 136 135 - 145 mmol/L    Potassium 4.7 3.5 - 5.1 mmol/L      Comment: DELTA CHECK NOTED    Chloride 97 (L) 101 - 111 mmol/L    CO2 24 22 - 32 mmol/L    Glucose, Bld 151 (H) 65 - 99 mg/dL    BUN 40 (H) 6 - 20 mg/dL    Creatinine, Ser 9.51 (H) 0.61 - 1.24 mg/dL    Calcium 9.2 8.9 - 10.3 mg/dL    GFR calc non Af Amer 6 (L) >60 mL/min    GFR calc Af Amer 6 (L) >60 mL/min      Comment: (NOTE) The eGFR has been calculated using the CKD EPI equation. This calculation has not been validated in all clinical situations. eGFR's persistently <60 mL/min signify possible Chronic Kidney Disease.      Anion gap 15 5 - 15  Protime-INR     Status: Abnormal    Collection Time: 12/08/17  5:31 AM  Result Value Ref Range    Prothrombin Time 21.1 (H) 11.4 - 15.2 seconds    INR 1.84    CBC     Status: Abnormal    Collection Time: 12/08/17  5:31 AM   Result Value Ref Range    WBC 13.5 (H) 4.0 - 10.5 K/uL      Comment: REPEATED TO VERIFY    RBC 2.36 (L) 4.22 - 5.81 MIL/uL    Hemoglobin 6.8 (LL) 13.0 - 17.0 g/dL      Comment: REPEATED TO VERIFY CRITICAL RESULT CALLED TO, READ BACK BY AND VERIFIED WITH: R. TURGOTT, RN 12/08/2017 BY MACEDA,J. CALLED [id number] 12/08/2017      HCT 22.1 (L) 39.0 - 52.0 %    MCV 93.6 78.0 - 100.0 fL    MCH 28.8 26.0 - 34.0 pg    MCHC 30.8 30.0 - 36.0 g/dL    RDW 16.8 (H) 11.5 - 15.5 %    Platelets 316 150 - 400 K/uL      Comment: REPEATED TO VERIFY  Prepare RBC     Status: None    Collection Time: 12/08/17  6:57 AM  Result Value Ref Range    Order Confirmation ORDER PROCESSED BY BLOOD BANK    Glucose, capillary     Status: Abnormal    Collection Time: 12/08/17  7:23 AM  Result Value Ref Range    Glucose-Capillary 304 (H) 65 - 99 mg/dL  Glucose, capillary     Status: Abnormal    Collection Time: 12/08/17 11:55 AM  Result Value Ref Range    Glucose-Capillary 233 (H) 65 - 99 mg/dL  Hemoglobin and hematocrit, blood  Status: Abnormal    Collection Time: 12/08/17  1:45 PM  Result Value Ref Range    Hemoglobin 7.3 (L) 13.0 - 17.0 g/dL    HCT 23.3 (L) 39.0 - 52.0 %  Glucose, capillary     Status: Abnormal    Collection Time: 12/08/17  5:06 PM  Result Value Ref Range    Glucose-Capillary 197 (H) 65 - 99 mg/dL  Glucose, capillary     Status: Abnormal    Collection Time: 12/08/17 10:20 PM  Result Value Ref Range    Glucose-Capillary 198 (H) 65 - 99 mg/dL  Protime-INR     Status: Abnormal    Collection Time: 12/09/17  5:37 AM  Result Value Ref Range    Prothrombin Time 23.4 (H) 11.4 - 15.2 seconds    INR 2.11    CBC     Status: Abnormal    Collection Time: 12/09/17  5:37 AM  Result Value Ref Range    WBC 12.8 (H) 4.0 - 10.5 K/uL    RBC 2.52 (L) 4.22 - 5.81 MIL/uL    Hemoglobin 7.2 (L) 13.0 - 17.0 g/dL    HCT 23.1 (L) 39.0 - 52.0 %    MCV 91.7 78.0 - 100.0 fL    MCH 28.6 26.0 - 34.0 pg    MCHC  31.2 30.0 - 36.0 g/dL    RDW 17.7 (H) 11.5 - 15.5 %    Platelets 289 150 - 400 K/uL  Renal function panel     Status: Abnormal    Collection Time: 12/09/17  7:00 AM  Result Value Ref Range    Sodium 134 (L) 135 - 145 mmol/L    Potassium 4.4 3.5 - 5.1 mmol/L    Chloride 96 (L) 101 - 111 mmol/L    CO2 22 22 - 32 mmol/L    Glucose, Bld 233 (H) 65 - 99 mg/dL    BUN 52 (H) 6 - 20 mg/dL    Creatinine, Ser 11.41 (H) 0.61 - 1.24 mg/dL    Calcium 8.8 (L) 8.9 - 10.3 mg/dL    Phosphorus 6.8 (H) 2.5 - 4.6 mg/dL    Albumin 2.1 (L) 3.5 - 5.0 g/dL    GFR calc non Af Amer 4 (L) >60 mL/min    GFR calc Af Amer 5 (L) >60 mL/min      Comment: (NOTE) The eGFR has been calculated using the CKD EPI equation. This calculation has not been validated in all clinical situations. eGFR's persistently <60 mL/min signify possible Chronic Kidney Disease.      Anion gap 16 (H) 5 - 15  Prepare RBC     Status: None    Collection Time: 12/09/17 10:06 AM  Result Value Ref Range    Order Confirmation ORDER PROCESSED BY BLOOD BANK    Glucose, capillary     Status: Abnormal    Collection Time: 12/09/17 12:40 PM  Result Value Ref Range    Glucose-Capillary 145 (H) 65 - 99 mg/dL      Imaging Results (Last 48 hours)  No results found.           Medical Problem List and Plan: 1.  Decreased functional mobility secondary to right BKA 12/01/2017 as well as history of left BKA 3 years ago. Patient does have a left prosthesis 2.  DVT Prophylaxis/Anticoagulation: Chronic Coumadin for history of SVT. Monitor for any bleeding episodes 3. Pain Management: Oxycodone as needed 4. Mood: Provide emotional support 5. Neuropsych: This patient is capable of making  decisions on his own behalf. 6. Skin/Wound Care: Routine skin checks 7. Fluids/Electrolytes/Nutrition: Routine I&O's with follow-up chemistries 8. ESRD. Continue hemodialysis as per renal services 9. Acute on chronic anemia.Aranesp 10.HIV. Continue current  retroviral therapy 11.Diabetes mellitus with peripheral neuropathy. Hemoglobin A1c 7.7.lantus 25 unit QHS. Check blood sugars before meals and QHS 12.HTN/SVT.Norvasc 85m daily, lisinopril 40 mg daily, Lopressor 25 mg twice a day. Monitor with increased mobility 13. Congestive heart. Monitor for any signs of fluid overload   Post Admission Physician Evaluation: 1. Functional deficits secondary  to new Right BKA 12/06/17 and prior Left BKA in 2015. 2. Patient is admitted to receive collaborative, interdisciplinary care between the physiatrist, rehab nursing staff, and therapy team. 3. Patient's level of medical complexity and substantial therapy needs in context of that medical necessity cannot be provided at a lesser intensity of care such as a SNF. 4. Patient has experienced substantial functional loss from his/her baseline which was documented above under the "Functional History" and "Functional Status" headings.  Judging by the patient's diagnosis, physical exam, and functional history, the patient has potential for functional progress which will result in measurable gains while on inpatient rehab.  These gains will be of substantial and practical use upon discharge  in facilitating mobility and self-care at the household level. 5. Physiatrist will provide 24 hour management of medical needs as well as oversight of the therapy plan/treatment and provide guidance as appropriate regarding the interaction of the two. 6. The Preadmission Screening has been reviewed and patient status is unchanged unless otherwise stated above. 7. 24 hour rehab nursing will assist with bowel management, safety, skin/wound care, disease management, medication administration, pain management and patient education  and help integrate therapy concepts, techniques,education, etc. 8. PT will assess and treat for/with: pre gait, gait training, endurance , safety, equipment, neuromuscular re education.   Goals are: Mod I. 9. OT  will assess and treat for/with: ADLs, Cognitive perceptual skills, Neuromuscular re education, safety, endurance, equipment.   Goals are: Mod I. Therapy may proceed with showering this patient. 10. SLP will assess and treat for/with: NA.  Goals are: NA. 11. Case Management and Social Worker will assess and treat for psychological issues and discharge planning. 12. Team conference will be held weekly to assess progress toward goals and to determine barriers to discharge. 13. Patient will receive at least 3 hours of therapy per day at least 5 days per week. 14. ELOS: 7-10d       15. Prognosis:  good         ACharlett BlakeM.D. CLuk[facility]ukaiGroup FAAPM&R (Sports Med, Neuromuscular Med) Diplomate Am Board of Electrodiagnostic Med  DElizabeth Sauer12/10/2017

## 2017-12-11 NOTE — Progress Notes (Signed)
I have an inpt rehab bed to admit pt to today. Pt in agreement. I have notified Attending team, RN CM and SW. I will make the arrangements to admit today. [phone]

## 2017-12-11 NOTE — Discharge Instructions (Signed)
Please follow up with Orthopedic Surgery (Dr. Sharol Given) in 1 week. You can call their office to make an appointment.

## 2017-12-11 NOTE — Procedures (Signed)
Patient seen and examined on Hemodialysis. QB 400 mL/ min via R RC AVF UF goal 2.6L  Doing well.  Hoping to go to CIR.  Treatment adjusted as needed.  Madelon Lips MD [facility] pgr [phone]  2:57 PM

## 2017-12-11 NOTE — Progress Notes (Signed)
  Date: 12/11/2017  Patient name: Thomas [name]  Medical record number: [id number]  Date of birth: [birth year]   I have seen and evaluated this patient and I have discussed the plan of care with the house staff. Please see Dr .Allayne Gitelman note for complete details. I concur with her findings with the following additions/corrections:   Patient to be discharged to CIR today for rehab.  He is amenable and excited to get his mobility back after surgery.    Sid Falcon, MD 12/11/2017, 1:54 PM

## 2017-12-11 NOTE — Progress Notes (Signed)
ANTICOAGULATION CONSULT NOTE - Follow-Up  Pharmacy Consult for warfarin Indication: atrial fibrillation  No Known Allergies  Patient Measurements: Height: 6\' 6"  (198.1 cm) Weight: 264 lb 1.8 oz (119.8 kg) IBW/kg (Calculated) : 91.4  Vital Signs: Temp: 98.4 F (36.9 C) (12/13 1120) Temp Source: Oral (12/13 1120) BP: 113/80 (12/13 1130) Pulse Rate: 92 (12/13 1130)  Labs: Recent Labs    12/09/17 0537 12/09/17 0700 12/10/17 0644 12/11/17 0853  HGB 7.2*  --  8.4* 8.3*  HCT 23.1*  --  27.5* 26.5*  PLT 289  --  314 362  LABPROT 23.4*  --  20.0* 17.4*  INR 2.11  --  1.72 1.44  CREATININE  --  11.41*  --   --     Estimated Creatinine Clearance: 11.1 mL/min (A) (by C-G formula based on SCr of 11.41 mg/dL (H)).   Assessment: 26 YOM with AFib who was admitted with a supratherapeutic INR. INR was 3.46 on 12/7- patient received vitamin K 2.5mg  PO to reverse INR to <2 for surgery on 12/8. Warfarin was restarted on 12/9.  INR today is SUBtherapeutic after restarting dosing on 12/11  (INR 1.44<1.72 < 2.11, goal of 2-3). Hgb low/stable at 8.3 < 8.4 < 7.2<6.8 after a total of 3 units PRBC over 12/10-12/11. No active bleeding noted per chart review or per RN report this morning. Patient is to discharge today ,to admit to inpatient rehab at [facility].  The patient was re-educated on warfarin o n12/12/18. PTA Home dose of Warfarin was 10mg  daily except 7.5mg  on TTSat  Goal of Therapy:  INR 2-3 Monitor platelets by anticoagulation protocol: Yes   Plan:  1. Warfarin 7.5 mg x 1 dose at 1800 today 2. Will continue to monitor for any signs/symptoms of bleeding and will follow up with PT/INR in the a.m.  Thank you for allowing pharmacy to be a part of this patient's care. Nicole Cella, RPh Clinical Pharmacist Clinical phone for 12/11/2017 from 7a-3:30p: [phone] If after 3:30p, please call main pharmacy at: [phone] 12/11/2017 12:02 PM

## 2017-12-11 NOTE — Progress Notes (Signed)
Patient transferred to inpatient rehab, report called to Elana RN. Patient and family aware of the plan to transfer.  Adrain Nesbit, Tivis Ringer, RN

## 2017-12-11 NOTE — IPOC Note (Signed)
Overall Plan of Care [facility]) Patient Details Name: Thomas [name] MRN: [id number] DOB: Jul 06, 1966  Admitting Diagnosis: Unilateral complete BKA, right, subsequent encounter [facility])  Hospital Problems: Principal Problem:   Unilateral complete BKA, right, subsequent encounter ([place]) Active Problems:   Type 2 diabetes mellitus with complication ([place])   History of left below knee amputation ([place])   S/P bilateral below knee amputation ([place])   Subtherapeutic international normalized ratio (INR)     Functional Problem List: Nursing Edema, Endurance, Medication Management, Motor, Nutrition, Pain, Safety, Sensory, Skin Integrity  PT Balance, Behavior, Edema, Endurance, Motor, Pain, Skin Integrity  OT Balance, Safety, Sensory, Edema, Skin Integrity, Endurance, Motor  SLP    TR         Basic ADL's: OT Grooming, Bathing, Dressing, Toileting     Advanced  ADL's: OT       Transfers: PT Bed Mobility, Bed to Chair, Furniture, Teacher, early years/pre, Metallurgist: PT Ambulation, Emergency planning/management officer, Stairs     Additional Impairments: OT    SLP        TR      Anticipated Outcomes Item Anticipated Outcome  Self Feeding n/a  Swallowing      Basic self-care  supervision  Toileting  supervision   Bathroom Transfers supervision   Bowel/Bladder  min assist  Transfers  supervision  Locomotion  supervision   Communication     Cognition     Pain  less<3  Safety/Judgment  min assist   Therapy Plan: PT Intensity: Minimum of 1-2 x/day ,45 to 90 minutes PT Frequency: 5 out of 7 days PT Duration Estimated Length of Stay: 10-12 OT Intensity: Minimum of 1-2 x/day, 45 to 90 minutes OT Frequency: 5 out of 7 days OT Duration/Estimated Length of Stay: 10-12 days      Team Interventions: Nursing Interventions Patient/Family Education, Pain Management, Skin Care/Wound Management, Psychosocial Support, Disease Management/Prevention, Medication Management, Discharge Planning   PT interventions Ambulation/gait training, Pain management, Stair training, Training and development officer, DME/adaptive equipment instruction, Patient/family education, Therapeutic Activities, Wheelchair propulsion/positioning, Psychosocial support, Therapeutic Exercise, Community reintegration, Functional mobility training, Skin care/wound management, UE/LE Strength taining/ROM, Discharge planning, Splinting/orthotics, UE/LE Coordination activities, Neuromuscular re-education, Disease management/prevention  OT Interventions Balance/vestibular training, Discharge planning, Pain management, Self Care/advanced ADL retraining, Therapeutic Activities, UE/LE Coordination activities, Disease mangement/prevention, Functional mobility training, Patient/family education, Skin care/wound managment, Therapeutic Exercise, UE/LE Strength taining/ROM, DME/adaptive equipment instruction, Community reintegration, Psychosocial support  SLP Interventions    TR Interventions    SW/CM Interventions Discharge Planning, Psychosocial Support, Patient/Family Education   Barriers to Discharge MD  Medical stability and Wound care  Nursing      PT Home environment access/layout bed/bath on second floor  OT      SLP      SW       Team Discharge Planning: Destination: PT-Home ,OT- Home , SLP-  Projected Follow-up: PT-Home health PT, OT-  Home health OT, SLP-  Projected Equipment Needs: PT-To be determined, OT- To be determined, SLP-  Equipment Details: PT- , OT-  Patient/family involved in discharge planning: PT- Patient,  OT-Patient, SLP-   MD ELOS: 10-12 days. Medical Rehab Prognosis:  Good Assessment: 51 y.o.right handed malewith history of diabetes mellitus, end-stage renal disease with hemodialysis, SVT and maintained on chronic Coumadin, HIV positive, diastolic congestive heart failure, left BKA3 years ago in [place].Used a left prosthesis provided by [employer] prior to admission. Presented 12/05/2017 with  nonhealing right foot wound. X-rays and imaging of  right foot showed multifocal marrow edema in the calcaneus and in the distal 5 cm of the fibula consistent with osteomyelitis. Large skin ulceration along the lateral aspect of the ankle extending almost to the peroneal tendons. 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 Sharol Given and application of wound VAC. Hospital course pain management. Hemodialysis ongoing as per renal services. Noted acute on chronic anemia 6.8 he was transfused. Chronic Coumadin has been resumed. Patient with resulting functional deficits with mobility, transfers, safety, ADLs.  Will set goals for Supervision/Min A for most tasks with PT/OT.  See Team Conference Notes for weekly updates to the plan of care

## 2017-12-11 NOTE — Progress Notes (Signed)
Patient and family were informed about rehab process,including patient safety plan ,and rehab booklet.

## 2017-12-11 NOTE — Progress Notes (Signed)
Thomas Gong, RN  Rehab Admission Coordinator  Physical Medicine and Rehabilitation  PMR Pre-admission  Signed  Date of Service:  12/09/2017 2:02 PM       Related encounter: ED to Hosp-Admission (Current) from 12/05/2017 in [place]           [] Hide copied text  [] Hover for details   PMR Admission Coordinator Pre-Admission Assessment  Patient: Thomas [name] is an 51 y.o., male MRN: [id number] DOB: 1966-04-04 Height: 6\' 6"  (198.1 cm) Weight: 117 kg (257 lb 15 oz)                                                                                                                                                  Insurance Information HMO:     PPO:      PCP:      IPA:      80/20: yes     OTHER: no HMO PRIMARY: Medicare a and b      Policy#: [id number]      Subscriber: pt Benefits:  Phone #: passport one online 12/09/2017 Eff. Date: 01/30/2013     Deduct: $1340      Out of Pocket Max: none      Life Max: none CIR: 100%      SNF: 20 full days Outpatient: 80%     Co-Pay: 20% Home Health: 100%      Co-Pay: none DME: 80%     Co-Pay: 20% Providers: pt choice  SECONDARY: Synetta Fail      Policy#: [id number]      Subscriber: pt  Medicaid Application Date:       Case Manager:  Disability Application Date:       Case Worker:   Emergency Contact Information        Contact Information    Name Relation Home Work Mobile   Thomas [name] Spouse [phone]       Current Medical History  Patient Admitting Diagnosis: right BKA with history of left BKA  History of Present Illness: :Thomas [name] a 51 y.o.right handed malewith history of diabetes mellitus, end-stage renal disease with hemodialysis, SVT and maintained on chronic Coumadin, HIV positive, diastolic congestive heart failure, left BKA3 years ago in [place].. Used a left prosthesis provided by [employer] prior to admission. Presented 12/05/2017 with nonhealing  right foot wound. X-rays and imaging of right foot showed multifocal marrow edema in the calcaneus and in the distal 5 cm of the fibula consistent with osteomyelitis. Large skin ulceration along the lateral aspect of the ankle extending almost to the peroneal tendons. 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 Sharol Given and application of wound VAC. Hospital course pain management. Hemodialysis ongoing as per renal services. Noted acute on chronic anemia 6.8  he was transfused. Chronic Coumadin has been resumed.   Past Medical History      Past Medical History:  Diagnosis Date  . Anemia   . Chronic combined systolic and diastolic CHF (congestive heart failure) ([place])   . Diabetes ([place])   . ESRD (end stage renal disease) [facility])    [address] T, Th, Sat  . HIV disease ([place])   . Hyperparathyroidism, secondary ([place])   . Hypertension   . Hypertensive heart disease with end stage renal disease on dialysis ([place]) 03/13/2017  . SVT (supraventricular tachycardia) ([place])    ? afib or atrial flutter s/p TEE/DCCV with subsequent ablation due to reoccurrence in [place]  . Type 2 diabetes mellitus ([facility])     Family History  family history includes Diabetes in his mother; Heart disease in his father and mother.  Prior Rehab/Hospitalizations:  Has the patient had major surgery during 100 days prior to admission? No  Current Medications   Current Facility-Administered Medications:  .  0.9 %  sodium chloride infusion, 100 mL, Intravenous, PRN, Madelon Lips, MD .  0.9 %  sodium chloride infusion, 100 mL, Intravenous, PRN, Madelon Lips, MD .  0.9 %  sodium chloride infusion, , Intravenous, Continuous, [name], MD .  0.9 %  sodium chloride infusion, , Intravenous, Once, Alric Seton, PA-C .  acetaminophen (TYLENOL) tablet 650 mg, 650 mg, Oral, Q4H PRN, 650 mg at 12/08/17 1718 **OR** acetaminophen (TYLENOL) suppository  650 mg, 650 mg, Rectal, Q4H PRN, [name], MD .  amLODipine (NORVASC) tablet 5 mg, 5 mg, Oral, QPM, Hoffman, Jessica Ratliff, DO, 5 mg at 12/10/17 1834 .  camphor-menthol (SARNA) lotion, , Topical, PRN, Neva Seat, MD .  Darbepoetin Alfa (ARANESP) injection 200 mcg, 200 mcg, Intravenous, Q Sat-HD, Madelon Lips, MD, 200 mcg at 12/06/17 1504 .  diphenhydrAMINE (BENADRYL) capsule 25 mg, 25 mg, Oral, Once, Harbrecht, Lawrence, MD .  diphenhydrAMINE (BENADRYL) capsule 25 mg, 25 mg, Oral, Q8H PRN, Alric Seton, PA-C .  doxercalciferol (HECTOROL) injection 5 mcg, 5 mcg, Intravenous, Q T,Th,Sa-HD, Madelon Lips, MD, 5 mcg at 12/09/17 1113 .  feeding supplement (NEPRO CARB STEADY) liquid 237 mL, 237 mL, Oral, BID BM, Alric Seton, PA-C, 237 mL at 12/11/17 0915 .  ferric gluconate (NULECIT) 125 mg in sodium chloride 0.9 % 100 mL IVPB, 125 mg, Intravenous, Q Thu-HD, Bergman, Martha, PA-C .  fosamprenavir (LEXIVA) tablet 700 mg, 700 mg, Oral, BID, Kalman Shan Ratliff, DO, 700 mg at 12/11/17 0916 .  HYDROmorphone (DILAUDID) injection 1 mg, 1 mg, Intravenous, Q2H PRN, Hoffman, Jessica Ratliff, DO, 1 mg at 12/08/17 1718 .  insulin aspart (novoLOG) injection 0-20 Units, 0-20 Units, Subcutaneous, TID WC, Hoffman, Jessica Ratliff, DO, 3 Units at 12/11/17 0915 .  insulin aspart (novoLOG) injection 4 Units, 4 Units, Subcutaneous, TID WC, Colbert Ewing, MD, 4 Units at 12/11/17 0915 .  insulin glargine (LANTUS) injection 25 Units, 25 Units, Subcutaneous, QHS, Colbert Ewing, MD, 25 Units at 12/10/17 2235 .  lip balm (BLISTEX) ointment, , Topical, PRN, Lucious Groves, DO .  lisinopril (PRINIVIL,ZESTRIL) tablet 40 mg, 40 mg, Oral, Daily, Hoffman, Jessica Ratliff, DO, 40 mg at 12/10/17 0955 .  metoCLOPramide (REGLAN) tablet 5-10 mg, 5-10 mg, Oral, Q8H PRN **OR** metoCLOPramide (REGLAN) injection 5-10 mg, 5-10 mg, Intravenous, Q8H PRN, [name], MD .  metoprolol tartrate (LOPRESSOR)  tablet 25 mg, 25 mg, Oral, BID, Hoffman, Jessica Ratliff, DO, 25 mg at 12/10/17 2234 .  multivitamin (  RENA-VIT) tablet 1 tablet, 1 tablet, Oral, QHS, Alric Seton, PA-C, 1 tablet at 12/10/17 2235 .  naloxone [facility]) injection 0.4 mg, 0.4 mg, Intravenous, PRN, Alphonzo Grieve, MD, 0.4 mg at 12/06/17 1758 .  ondansetron (ZOFRAN) tablet 4 mg, 4 mg, Oral, Q6H PRN **OR** ondansetron (ZOFRAN) injection 4 mg, 4 mg, Intravenous, Q6H PRN, [name], MD .  oxyCODONE (Oxy IR/ROXICODONE) immediate release tablet 10 mg, 10 mg, Oral, Q3H PRN, [name], MD, 10 mg at 12/10/17 2234 .  oxyCODONE (Oxy IR/ROXICODONE) immediate release tablet 5 mg, 5 mg, Oral, Q3H PRN, [name], MD, 5 mg at 12/08/17 1053 .  raltegravir (ISENTRESS) tablet 400 mg, 400 mg, Oral, BID, Kalman Shan Ratliff, DO, 400 mg at 12/11/17 0916 .  ritonavir (NORVIR) capsule 100 mg, 100 mg, Oral, Q breakfast, Hoffman, Jessica Ratliff, DO, 100 mg at 12/11/17 0916 .  Warfarin - Pharmacist Dosing Inpatient, , Does not apply, q1800, Bajbus, Lauren D, RPH  Patients Current Diet: Diet renal with fluid restriction Fluid restriction: 1200 mL Fluid; Room service appropriate? Yes; Fluid consistency: Thin  Precautions / Restrictions Precautions Precautions: Fall Precaution Comments: wound vac RLE Other Brace/Splint: LLE prothestic Restrictions Weight Bearing Restrictions: Yes RLE Weight Bearing: Non weight bearing   Has the patient had 2 or more falls or a fall with injury in the past year?No  Prior Activity Level Community (5-7x/wk): Mod I iwth cane and prosthetic leg pta; did not drive; wife drove to hemodialysis  Home Assistive Devices / [place] Devices/Equipment: Cane (specify quad or straight), Wheelchair(Prostethic)  Prior Device Use: Indicate devices/aids used by the patient prior to current illness, exacerbation or injury? Orthotics/Prosthetics. Pt states he received his prosthetic leg 2 to 3  years ago in [place]  Prior Functional Level Prior Function Level of Independence: Independent with assistive device(s) Comments: LLE prosthesis  Self Care: Did the patient need help bathing, dressing, using the toilet or eating?  Independent  Indoor Mobility: Did the patient need assistance with walking from room to room (with or without device)? Independent  Stairs: Did the patient need assistance with internal or external stairs (with or without device)? Independent  Functional Cognition: Did the patient need help planning regular tasks such as shopping or remembering to take medications? Needed some help  Current Functional Level Cognition  Overall Cognitive Status: Within Functional Limits for tasks assessed Orientation Level: Oriented X4    Extremity Assessment (includes Sensation/Coordination)  Upper Extremity Assessment: Overall WFL for tasks assessed  Lower Extremity Assessment: Generalized weakness    ADLs  Overall ADL's : Needs assistance/impaired Eating/Feeding: Independent, Bed level Grooming: Set up, Sitting, Wash/dry hands, Wash/dry face Grooming Details (indicate cue type and reason): in recliner Upper Body Bathing: Set up, Bed level Lower Body Bathing: Maximal assistance, Bed level Upper Body Dressing : Moderate assistance, Bed level Lower Body Dressing: Total assistance, Bed level Toilet Transfer: Moderate assistance, +2 for physical assistance, +2 for safety/equipment, Anterior/posterior Toilet Transfer Details (indicate cue type and reason): simulated through recliner transfer, use of bed pads to assist  General ADL Comments: Pt limited in his basic ADLs in unsupported sitting due to pain with transitional movements and throbbing when residual limb is in dependent position    Mobility  Overal bed mobility: Needs Assistance Bed Mobility: Supine to Sit, Sit to Supine Supine to sit: Mod assist Sit to supine: Mod assist General bed mobility comments:  pt sitting upright with bilateral residual limbs supported in a long sitting position upon  arrival    Transfers  Overall transfer level: Needs assistance Equipment used: (bed pads) Transfers: Government social research officer transfers: Mod assist, +2 physical assistance  Lateral/Scoot Transfers: Min assist General transfer comment: assist with use of bed pads to perform A-P transfers from bed to recliner    Ambulation / Gait / Stairs / Wheelchair Mobility  Ambulation/Gait General Gait Details: not attempted due to pain complaints    Posture / Balance Dynamic Sitting Balance Sitting balance - Comments: reliant on at least one UE support to maintain upright sitting Balance Overall balance assessment: Needs assistance Sitting-balance support: Single extremity supported Sitting balance-Leahy Scale: Poor Sitting balance - Comments: reliant on at least one UE support to maintain upright sitting Postural control: Posterior lean Standing balance support: Bilateral upper extremity supported Standing balance comment: unable to assist himself due to pain    Special needs/care consideration BiPAP/CPAP  N/a CPM  N/a Continuous Drip IV n/a Dialysis Tues, Thurs, Sat [address] Life Vest  N/a Oxygen  N/a Special Bed  N/a Trach Size  N/a Wound Vac yes R BKA surgical site for planned 7 days Skin surgical incision to stump Bowel mgmt: continent LBM 12/13. Loose BMS so pt requested to have stool softeners d/c'd Bladder mgmt:anuric Diabetic mgmt yes pta   Previous Home Environment Living Arrangements: (and mother in law)  Lives With: Spouse(and mother in Sports coach) Available Help at Discharge: Family, Available 24 hours/day Type of Home: [place]: 1/2 bath on main level, Bed/bath upstairs, Two level Home Access: Ramped entrance Bathroom Shower/Tub: (upstairs) Bathroom Toilet: Standard Bathroom Accessibility: Yes How Accessible: Accessible via walker [place]: No Additional Comments: pt requesting information on stair lifts he wants to install to reach upstairs; also about any avialble funding if there is any  Pt reports he was Mod I with cane and prosthetic leg pta. Did not drive  Discharge Living Setting Plans for Discharge Living Setting: Patient's home, Lives with (comment)(wife and mother in law) Type of Home at Discharge: House Discharge Home Layout: Two level, Bed/bath upstairs, 1/2 bath on main level Discharge Home Access: [place] entrance Discharge Bathroom Shower/Tub: Tub/shower unit, Walk-in shower Discharge Bathroom Toilet: Standard Discharge Bathroom Accessibility: Yes How Accessible: Accessible via walker Does the patient have any problems obtaining your medications?: No  Social/Family/Support Systems Patient Roles: Spouse, Parent Contact Information: [name] , wife Anticipated Caregiver: wife Anticipated Ambulance person Information: see above Ability/Limitations of Caregiver: wife retired also; 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[place]; 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 Caregiver Availability: 24/7 Discharge Plan Discussed with Primary Caregiver: Yes Is Caregiver In Agreement with Plan?: Yes Does Caregiver/Family have Issues with Lodging/Transportation while Pt is in Rehab?: No  Goals/Additional Needs Patient/Family Goal for Rehab: Mod I to supervision with PT and OT at a wheelchair level Expected length of stay: ELOS 7 to 12 days Special Service Needs: Hemodialysis 3 times per week(wife transports pt to hemodialysis) Pt/Family Agrees to Admission and willing to participate: Yes Program Orientation Provided & Reviewed with Pt/Caregiver Including Roles  & Responsibilities: Yes  Barriers to Discharge: (bed and bathroom upstairs; 1/2 bath downstairs; pt looking i)  Decrease burden of Care through IP rehab admission: n/a  Possible need for SNF placement upon discharge:not anticipated  Patient Condition: This patient's  medical and functional status has changed since the consult dated: 12/08/2017 in which the Rehabilitation Physician determined and documented that the patient's condition is appropriate for intensive rehabilitative care in an inpatient rehabilitation facility. See "History  of Present Illness" (above) for medical update. Functional changes are: mod assist with transfers to chair. Patient's medical and functional status update has been discussed with the Rehabilitation physician and patient remains appropriate for inpatient rehabilitation. Will admit to inpatient rehab today.  Preadmission Screen Completed By:  Cleatrice Burke, 12/11/2017 10:27 AM ______________________________________________________________________   Discussed status with Dr. Letta Pate on 12/11/2017 at  1029 and received telephone approval for admission today.  Admission Coordinator:  Cleatrice Burke, time 0165 Date 12/11/2017             Cosigned by: Charlett Blake, MD at 12/11/2017 10:41 AM  Revision History

## 2017-12-11 NOTE — Progress Notes (Signed)
[facility] Progress Note   Dialysis Orders: TTS [facility] right AVF 4h 9min 3k/2.25 bath 123kg Hep none -mircera 225 q 2wks- last got 150 on 11/29 -hect 5 ug tiw -venofer 100 q wk  Assessment: 1.S/p right BKA 12/7-wound VAC in place- for transfer to rehab when bed is available 2. ESRD- TTS - new edw for d/c- no heparin HD; next HD today 3. Anemia-  hgb up to 8.3  Stable after 1 unit PRBC 12/10  And 1 unit 12/11 on max ESA; resume weekly Fe; continue ESA 4. Secondary hyperparathyroidism- on hectorol;P low without bindernow up today - if next P is up - will add binder 5.HTN/volume- multiple meds - volume seems good net UF 2 L Tuesday post wt 117 6. Nutrition- add supplements/vits to diet 7. SVT vs aflutter -on chronic coumadin/MTP- ^^ ectopy on HD avoid low K 8.HIV - on usual meds 9. DM -BS 150 - 200 10.^ BMs -lax stopped per patient request - advised we can resume slowly if he needs in the future.   [name], PA-C [facility] Beeper [phone] 12/11/2017,9:21 AM  LOS: 6 days   Pt seen, examined and agree w A/P as above.  [name] MD [employer] pager [phone]   12/11/2017, 12:19 PM    Subjective:   Requesting benadryl for itching. No other issues. For transfer to rehab  Objective Vitals:   12/10/17 2113 12/11/17 0232 12/11/17 0530 12/11/17 0858  BP: 130/69 110/63 126/75 136/75  Pulse: 94 83 86 92  Resp: 18  18 18   Temp: 99.1 F (37.3 C)  99 F (37.2 C) 98.4 F (36.9 C)  TempSrc: Oral  Oral Oral  SpO2: 94%  96% 96%  Weight: 117 kg (257 lb 15 oz)     Height:       Physical Exam General: NAD  Heart: RRR Lungs: rare crackle I base Abdomen: soft NT Extremities: left BKA no edema and right BKA in sock/VAC no sig edema Dialysis Access: right lower AVF + bruit   Additional Objective Labs: Basic Metabolic Panel: Recent Labs  Lab 12/06/17 1204 12/08/17 0531 12/09/17 0700  NA  130* 136 134*  K 3.7 4.7 4.4  CL 94* 97* 96*  CO2 22 24 22   GLUCOSE 229* 151* 233*  BUN 43* 40* 52*  CREATININE 9.00* 9.51* 11.41*  CALCIUM 7.9* 9.2 8.8*  PHOS 2.9  --  6.8*   Liver Function Tests: Recent Labs  Lab 12/05/17 0953 12/06/17 1204 12/09/17 0700  AST 15  --   --   ALT 14*  --   --   ALKPHOS 102  --   --   BILITOT 0.9  --   --   PROT 7.7  --   --   ALBUMIN 2.6* 2.3* 2.1*   No results for input(s): LIPASE, AMYLASE in the last 168 hours. CBC: Recent Labs  Lab 12/05/17 0953 12/06/17 0419 12/08/17 0531 12/08/17 1345 12/09/17 0537 12/10/17 0644  WBC 14.0* 11.8* 13.5*  --  12.8* 10.5  NEUTROABS 11.1*  --   --   --   --   --   HGB 8.4* 7.6* 6.8* 7.3* 7.2* 8.4*  HCT 26.6* 23.8* 22.1* 23.3* 23.1* 27.5*  MCV 94.0 92.2 93.6  --  91.7 91.4  PLT 314 309 316  --  289 314   Blood Culture    Component Value Date/Time   SDES BLOOD LEFT FOREARM 12/05/2017 1313   SPECREQUEST  12/05/2017 1313  BOTTLES DRAWN AEROBIC AND ANAEROBIC Blood Culture adequate volume   CULT NO GROWTH 5 DAYS 12/05/2017 1313   REPTSTATUS 12/10/2017 FINAL 12/05/2017 1313    Cardiac Enzymes: No results for input(s): CKTOTAL, CKMB, CKMBINDEX, TROPONINI in the last 168 hours. CBG: Recent Labs  Lab 12/10/17 1215 12/10/17 1637 12/10/17 2111 12/11/17 0524 12/11/17 0749  GLUCAP 165* 205* 207* 154* 150*   Iron Studies: No results for input(s): IRON, TIBC, TRANSFERRIN, FERRITIN in the last 72 hours. Lab Results  Component Value Date   INR 1.72 12/10/2017   INR 2.11 12/09/2017   INR 1.84 12/08/2017   Studies/Results: No results found. Medications: . sodium chloride    . sodium chloride    . sodium chloride    . sodium chloride    . ferric gluconate (FERRLECIT/NULECIT) IV     . amLODipine  5 mg Oral QPM  . darbepoetin (ARANESP) injection - DIALYSIS  200 mcg Intravenous Q Sat-HD  . doxercalciferol  5 mcg Intravenous Q T,Th,Sa-HD  . feeding supplement (NEPRO CARB STEADY)  237 mL Oral  BID BM  . fosamprenavir  700 mg Oral BID  . insulin aspart  0-20 Units Subcutaneous TID WC  . insulin aspart  4 Units Subcutaneous TID WC  . insulin glargine  25 Units Subcutaneous QHS  . lisinopril  40 mg Oral Daily  . metoprolol tartrate  25 mg Oral BID  . multivitamin  1 tablet Oral QHS  . raltegravir  400 mg Oral BID  . ritonavir  100 mg Oral Q breakfast  . Warfarin - Pharmacist Dosing Inpatient   Does not apply [phone]

## 2017-12-12 ENCOUNTER — Inpatient Hospital Stay (HOSPITAL_COMMUNITY): Payer: Medicare Other | Admitting: Occupational Therapy

## 2017-12-12 ENCOUNTER — Inpatient Hospital Stay (HOSPITAL_COMMUNITY): Payer: Medicare Other | Admitting: Physical Therapy

## 2017-12-12 ENCOUNTER — Inpatient Hospital Stay (HOSPITAL_COMMUNITY): Payer: Medicare Other

## 2017-12-12 ENCOUNTER — Other Ambulatory Visit: Payer: Self-pay

## 2017-12-12 DIAGNOSIS — I10 Essential (primary) hypertension: Secondary | ICD-10-CM

## 2017-12-12 DIAGNOSIS — R791 Abnormal coagulation profile: Secondary | ICD-10-CM

## 2017-12-12 DIAGNOSIS — D638 Anemia in other chronic diseases classified elsewhere: Secondary | ICD-10-CM

## 2017-12-12 DIAGNOSIS — G8918 Other acute postprocedural pain: Secondary | ICD-10-CM

## 2017-12-12 DIAGNOSIS — B20 Human immunodeficiency virus [HIV] disease: Secondary | ICD-10-CM

## 2017-12-12 DIAGNOSIS — D62 Acute posthemorrhagic anemia: Secondary | ICD-10-CM

## 2017-12-12 DIAGNOSIS — Z8679 Personal history of other diseases of the circulatory system: Secondary | ICD-10-CM

## 2017-12-12 DIAGNOSIS — Z89511 Acquired absence of right leg below knee: Secondary | ICD-10-CM

## 2017-12-12 LAB — GLUCOSE, CAPILLARY
Glucose-Capillary: 147 mg/dL — ABNORMAL HIGH (ref 65–99)
Glucose-Capillary: 189 mg/dL — ABNORMAL HIGH (ref 65–99)
Glucose-Capillary: 175 mg/dL — ABNORMAL HIGH (ref 65–99)
Glucose-Capillary: 287 mg/dL — ABNORMAL HIGH (ref 65–99)

## 2017-12-12 LAB — PROTIME-INR
INR: 1.45
PROTHROMBIN TIME: 17.5 s — AB (ref 11.4–15.2)

## 2017-12-12 MED ORDER — PANCRELIPASE (LIP-PROT-AMYL) 12000-38000 UNITS PO CPEP
12000.0000 [IU] | ORAL_CAPSULE | Freq: Three times a day (TID) | ORAL | Status: DC
  Administered 2017-12-12 – 2017-12-20 (×23): 12000 [IU] via ORAL
  Filled 2017-12-12 (×25): qty 1

## 2017-12-12 MED ORDER — PRO-STAT SUGAR FREE PO LIQD
30.0000 mL | Freq: Two times a day (BID) | ORAL | Status: DC
  Administered 2017-12-12 – 2017-12-15 (×3): 30 mL via ORAL
  Filled 2017-12-12 (×10): qty 30

## 2017-12-12 NOTE — Progress Notes (Signed)
[facility] Progress Note   Subjective:   Overall doing well, continues to have loose stools, pain 6/10. No other complaints.   Objective Vitals:   12/11/17 1855 12/12/17 0523  BP: 128/76 120/81  Pulse: 91 84  Resp: 18 17  Temp: 98.3 F (36.8 C) 97.7 F (36.5 C)  TempSrc: Oral Oral  SpO2:  97%   Physical Exam General:NAD, WDWN, sitting in wheelchair  Heart:RRR, no murmur rub or gallop Lungs:CTAB, no wheeze, rales or rhonchi Abdomen:soft, NT, ND Extremities: b/l BKA, left prosthesis in place, R in sock, no significant edema  Dialysis Access: right AVF + bruit  Additional Objective Labs: Basic Metabolic Panel: Recent Labs  Lab 12/06/17 1204 12/08/17 0531 12/09/17 0700 12/11/17 1100  NA 130* 136 134* 136  K 3.7 4.7 4.4 3.4*  CL 94* 97* 96* 95*  CO2 22 24 22 25   GLUCOSE 229* 151* 233* 177*  BUN 43* 40* 52* 49*  CREATININE 9.00* 9.51* 11.41* 9.66*  CALCIUM 7.9* 9.2 8.8* 9.8  PHOS 2.9  --  6.8* 7.9*   Liver Function Tests: Recent Labs  Lab 12/06/17 1204 12/09/17 0700 12/11/17 1100  ALBUMIN 2.3* 2.1* 2.2*   CBC: Recent Labs  Lab 12/06/17 0419 12/08/17 0531  12/09/17 0537 12/10/17 0644 12/11/17 0853  WBC 11.8* 13.5*  --  12.8* 10.5 9.9  HGB 7.6* 6.8*   < > 7.2* 8.4* 8.3*  HCT 23.8* 22.1*   < > 23.1* 27.5* 26.5*  MCV 92.2 93.6  --  91.7 91.4 89.8  PLT 309 316  --  289 314 362   < > = values in this interval not displayed.   Blood Culture    Component Value Date/Time   SDES BLOOD LEFT FOREARM 12/05/2017 1313   SPECREQUEST  12/05/2017 1313    BOTTLES DRAWN AEROBIC AND ANAEROBIC Blood Culture adequate volume   CULT NO GROWTH 5 DAYS 12/05/2017 1313   REPTSTATUS 12/10/2017 FINAL 12/05/2017 1313    CBG: Recent Labs  Lab 12/11/17 0749 12/11/17 1627 12/11/17 2202 12/12/17 0704 12/12/17 1211  GLUCAP 150* 144* 202* 147* 189*    Lab Results  Component Value Date   INR 1.45 12/12/2017   INR 1.44 12/11/2017   INR 1.72 12/10/2017    Medications: . [START ON 12/18/2017] ferric gluconate (FERRLECIT/NULECIT) IV     . amLODipine  5 mg Oral QPM  . [START ON 12/13/2017] darbepoetin (ARANESP) injection - DIALYSIS  200 mcg Intravenous Q Sat-HD  . [START ON 12/13/2017] doxercalciferol  5 mcg Intravenous Q T,Th,Sa-HD  . feeding supplement (NEPRO CARB STEADY)  237 mL Oral BID BM  . fosamprenavir  700 mg Oral BID  . insulin aspart  0-20 Units Subcutaneous TID WC  . insulin aspart  4 Units Subcutaneous TID WC  . insulin glargine  25 Units Subcutaneous QHS  . lipase/protease/amylase  12,000 Units Oral TID AC  . lisinopril  40 mg Oral Daily  . metoprolol tartrate  25 mg Oral BID  . multivitamin  1 tablet Oral QHS  . raltegravir  400 mg Oral BID  . ritonavir  100 mg Oral Q breakfast  . warfarin  7.5 mg Oral ONCE-1800  . Warfarin - Pharmacist Dosing Inpatient   Does not apply q1800    Dialysis Orders: TTS [facility] right AVF 4h 53min 3k/2.25 bath EDW123kg Hep none -mircera 225 q 2wks- last got 150 on 11/29 -hect 5 ug tiw -venofer 100 q wk   Assessment: 1. S/p R BKA - now  transferred to rehab. 2. ESRD - TTS, new EDW at d/c.  NO heparin. Orders written for tomorrow. 3. Anemia of CKD- Hgb 8.3, s/p 2 Units pRBC, 1 unit on 12/10 & 1 unit on 12/11, on 244mcg aranesp starting 12/15 and restarting weekly IV iron.  4. Secondary hyperparathyroidism - Ca 9.8, on hectorol, P 7.9, P low previously, recheck and start binder if still high.  5. HTN/volume - Volume appears ok, will need new EDW at d/c. Post wt yesterday 116.7kg. BP well controlled on current meds.  Will remove volume as tolerated.  6. Nutrition - Alb 2.2, renal/carb modified diet. Nepro TID.  7. SVT vs A flutter - on coumadin - per primary 8. HIV - per primary 9. DM - per primary   Jen Mow, PA-C [facility] Pager: [phone] 12/12/2017,3:03 PM  LOS: 1 day   Pt seen, examined and agree w A/P as above.  [name] MD [employer] pager [phone]   12/13/2017, 8:45 AM

## 2017-12-12 NOTE — Progress Notes (Signed)
ANTICOAGULATION CONSULT NOTE - Follow-Up  Pharmacy Consult for warfarin Indication: atrial fibrillation  No Known Allergies  Vital Signs: Temp: 97.7 F (36.5 C) (12/14 0523) Temp Source: Oral (12/14 0523) BP: 120/81 (12/14 0523) Pulse Rate: 84 (12/14 0523)  Labs: Recent Labs    12/10/17 0644 12/11/17 0853 12/11/17 1100 12/12/17 0519  HGB 8.4* 8.3*  --   --   HCT 27.5* 26.5*  --   --   PLT 314 362  --   --   LABPROT 20.0* 17.4*  --  17.5*  INR 1.72 1.44  --  1.45  CREATININE  --   --  9.66*  --     Estimated Creatinine Clearance: 13 mL/min (A) (by C-G formula based on SCr of 9.66 mg/dL (H)).   Assessment: 89 YOM with AFib who was admitted with a supratherapeutic INR. INR was 3.46 on 12/7- patient received vitamin K 2.5mg  PO to reverse INR to <2 for surgery on 12/8. Warfarin was restarted on 12/9.  INR remains SUBtherapeutic after restarting dosing on 12/11. Hgb low/stable s/p 3 units PRBC over 12/10-12/11. No active bleeding noted per chart review.  The patient was re-educated on warfarin on 12/10/17. PTA Home dose of Warfarin was 10mg  daily except 7.5mg  on TTSat  Goal of Therapy:  INR 2-3 Monitor platelets by anticoagulation protocol: Yes   Plan:  Warfarin 7.5 mg x 1 dose Monitor daily INR, CBC, clinical course, s/sx of bleed, PO intake, DDI   Thank you for allowing [place] to participate in this patients care.  Jens Som, PharmD Clinical phone for 12/12/2017 from 7a-3:30p: [phone] If after 3:30p, please call main pharmacy at: [phone] 12/12/2017 12:27 PM

## 2017-12-12 NOTE — Progress Notes (Signed)
Patient information reviewed and entered into eRehab system by Gabreille Dardis, RN, CRRN, PPS Coordinator.  Information including medical coding and functional independence measure will be reviewed and updated through discharge.     Per nursing patient was given "Data Collection Information Summary for Patients in Inpatient Rehabilitation Facilities with attached "Privacy Act Statement-Health Care Records" upon admission.  

## 2017-12-12 NOTE — Evaluation (Addendum)
Occupational Therapy Assessment and Plan  Patient Details  Name: Thomas [name] MRN: [id number] Date of Birth: 1966-06-17  OT Diagnosis: acute pain and muscle weakness (generalized) Rehab Potential: Rehab Potential (ACUTE ONLY): Good ELOS: 10-12 days   Today's Date: 12/12/2017 OT Individual Time: 0900-1000 OT Individual Time Calculation (min): 60 min     Problem List:  Patient Active Problem List   Diagnosis Date Noted  . Subtherapeutic international normalized ratio (INR)   . History of left below knee amputation ([place]) 12/11/2017  . S/P bilateral below knee amputation ([place]) 12/11/2017  . Hypoxia   . History of supraventricular tachycardia   . Chronic diastolic congestive heart failure ([place])   . Unilateral complete BKA, right, subsequent encounter ([place])   . S/P bilateral BKA (below knee amputation) ([place])   . Acute blood loss anemia   . Anemia of chronic disease   . Leukocytosis   . Post-operative pain   . Non-ischemic cardiomyopathy ([place])   . Subacute osteomyelitis, right ankle and foot ([place])   . Wound infection   . Cutaneous abscess of right foot   . Hypokalemia 09/04/2017  . Diabetic wet gangrene of the foot ([place]) 09/04/2017  . Essential hypertension 09/03/2017  . GERD (gastroesophageal reflux disease) 09/03/2017  . Diabetic foot ulcer ([place]) 09/03/2017  . GIB (gastrointestinal bleeding) 07/26/2017  . Anemia due to end stage renal disease ([place]) 07/26/2017  . PAF (paroxysmal atrial fibrillation) ([place]) 07/26/2017  . Symptomatic anemia 07/26/2017  . Fluid overload 05/05/2017  . Elevated troponin 05/05/2017  . Pulmonary edema 05/05/2017  . ESRD (end stage renal disease) ([place]) 05/05/2017  . HIV disease ([place]) 03/25/2017  . Hypertensive heart disease with end stage renal disease on dialysis ([place]) 03/13/2017  . ESRD on dialysis ([place]) 03/13/2017  . Type 2 diabetes mellitus with complication ([place]) 76/72/0947  . SVT (supraventricular tachycardia) ([facility])     Past Medical  History:  Past Medical History:  Diagnosis Date  . Anemia   . Chronic combined systolic and diastolic CHF (congestive heart failure) ([place])   . Diabetes ([place])   . ESRD (end stage renal disease) [facility])    [facility] [address] T, Th, Sat  . HIV disease ([place])   . Hyperparathyroidism, secondary ([place])   . Hypertension   . Hypertensive heart disease with end stage renal disease on dialysis ([place]) 03/13/2017  . SVT (supraventricular tachycardia) ([place])    ? afib or atrial flutter s/p TEE/DCCV with subsequent ablation due to reoccurrence in [place]  . Type 2 diabetes mellitus ([place])    Past Surgical History:  Past Surgical History:  Procedure Laterality Date  . AMPUTATION Left    foot  . AMPUTATION Right 12/06/2017   Procedure: AMPUTATION BELOW KNEE;  Surgeon: [name], MD;  Location: [facility];  Service: Orthopedics;  Laterality: Right;  . APPLICATION OF WOUND VAC Right 09/04/2017   Procedure: APPLICATION OF WOUND VAC;  Surgeon: [name], DPM;  Location: [place] [facility];  Service: Podiatry;  Laterality: Right;  . APPLICATION OF WOUND VAC  12/06/2017   Procedure: APPLICATION OF WOUND VAC;  Surgeon: [name], MD;  Location: [place];  Service: Orthopedics;;  . COLONOSCOPY WITH PROPOFOL N/A 07/30/2017   Procedure: COLONOSCOPY WITH PROPOFOL;  Surgeon: [name], MD;  Location: [facility];  Service: Endoscopy;  Laterality: N/A;  . ESOPHAGOGASTRODUODENOSCOPY (EGD) WITH PROPOFOL N/A 07/28/2017   Procedure: ESOPHAGOGASTRODUODENOSCOPY (EGD) WITH PROPOFOL;  Surgeon: [name], MD;  Location: [facility];  Service: Endoscopy;  Laterality: N/A;  .  FLEXIBLE SIGMOIDOSCOPY N/A 07/27/2017   Procedure: FLEXIBLE SIGMOIDOSCOPY;  Surgeon: Buccini, Robert, MD;  Location: [facility];  Service: Endoscopy;  Laterality: N/A;  . GRAFT APPLICATION Right 11/12/2017   Procedure: SKIN GRAFT APPLICATION RIGHT FOOT;  Surgeon: Price, Michael J, DPM;  Location: [facility];  Service: Podiatry;  Laterality: Right;  . I&D  EXTREMITY Right 09/04/2017   Procedure: IRRIGATION AND DEBRIDEMENT EXTREMITY;  Surgeon: Price, Michael J, DPM;  Location[facility];  Service: Podiatry;  Laterality: Right;  . I&D EXTREMITY Right 11/12/2017   Procedure: IRRIGATION AND DEBRIDEMENT ULCER RIGHT FOOT;  Surgeon: Price, Michael J, DPM;  Location[facility];  Service: Podiatry;  Laterality: Right;  . RIGHT AV FISTULA PLACEMENT  10/19/2012  . WOUND DEBRIDEMENT N/A 09/24/2017   Procedure: DEBRIDEMENT WOUND;  Surgeon: Price, Michael J, DPM;  [facility];  Service: Podiatry;  Laterality: N/A;    Assessment & Plan Clinical Impression: Patient is a 51 y.o. year old male right handed malewith history of diabetes mellitus, end-stage renal disease with hemodialysis, SVT and maintained on chronic Coumadin, HIV positive, diastolic congestive heart failure, left BKA3 years ago in [place].Per chart review and patient,patient lives with wife and mother-in-law. Used a left prosthesis provided by [employer] prior to admission. Family assistance provided as needed.Presented 12/05/2017 with nonhealing right foot wound. X-rays and imaging of right foot showed multifocal marrow edema in the calcaneus and in the distal 5 cm of the fibula consistent with osteomyelitis. Large skin ulceration along the lateral aspect of the ankle extending almost to the peroneal tendons. 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 Duda and application of wound VAC. Hospital course pain management. Hemodialysis ongoing as per renal services. Noted acute on chronic anemia 6.8 he was transfused. Chronic Coumadin has been resumed.    Patient transferred to CIR on 12/11/2017 .    Patient currently requires mod with basic self-care skills and basic mobility  secondary to muscle weakness and inability to don prosthesis, decreased cardiorespiratoy endurance and decreased standing balance.  Prior to hospitalization, patient could complete  ADL with modified independent .  Patient will benefit from skilled intervention to decrease level of assist with basic self-care skills and increase independence with basic self-care skills prior to discharge home with care partner.  Anticipate patient will require 24 hour supervision and intermittent supervision and follow up home health.  OT - End of Session Activity Tolerance: Tolerates 30+ min activity with multiple rests Endurance Deficit: Yes OT Assessment Rehab Potential (ACUTE ONLY): Good OT Patient demonstrates impairments in the following area(s): Balance;Safety;Sensory;Edema;Skin Integrity;Endurance;Motor OT Basic ADL's Functional Problem(s): Grooming;Bathing;Dressing;Toileting OT Transfers Functional Problem(s): Toilet;Tub/Shower OT Plan OT Intensity: Minimum of 1-2 x/day, 45 to 90 minutes OT Frequency: 5 out of 7 days OT Duration/Estimated Length of Stay: 10-12 days OT Treatment/Interventions: Balance/vestibular training;Discharge planning;Pain management;Self Care/advanced ADL retraining;Therapeutic Activities;UE/LE Coordination activities;Disease mangement/prevention;Functional mobility training;Patient/family education;Skin care/wound managment;Therapeutic Exercise;UE/LE Strength taining/ROM;DME/adaptive equipment instruction;Community reintegration;Psychosocial support OT Self Feeding Anticipated Outcome(s): n/a OT Basic Self-Care Anticipated Outcome(s): supervision OT Toileting Anticipated Outcome(s): supervision OT Bathroom Transfers Anticipated Outcome(s): supervision  OT Recommendation Patient destination: Home Follow Up Recommendations: Home health OT Equipment Recommended: To be determined   Skilled Therapeutic Intervention Ot eval initiated with Ot purpose, role and goals discussed. Pt with no clothes on eval. Participated in bathing at sink level with lateral leans for peri area total A. Performed lateral leans into recliner built up with pillows next to him. Pt    reported he was unable to don prosthesis this morning and required the PT to don. Introduced wide drop arm BSC to perform toilet transfers with latearl scoot with min A with extra time and A for setup of w/c and instructional cues for hand placement. Pt reports having accidents with bowels- brief donned with lateral leans. Pt able to propel w/c mod I around unit.  Educated and problem solved about tub shower transfers with tub bench when able to access the upstairs- plans on getting a stair lift.  OT Evaluation Precautions/Restrictions  Precautions Precautions: Fall Precaution Comments: wound vac RLE Other Brace/Splint: LLE prothestic Restrictions Weight Bearing Restrictions: Yes RLE Weight Bearing: Non weight bearing General Chart Reviewed: Yes Family/Caregiver Present: No    Pain  soreness on residual limb  Home Living/Prior Functioning Home Living Available Help at Discharge: Family, Available 24 hours/day Type of Home: House Home Access: Ramped entrance Home Layout: 1/2 bath on main level, Bed/bath upstairs, Two level Alternate Level Stairs-Number of Steps: 14 steps to second floor Bathroom Shower/Tub: Tub/shower unit(upstairs)  Lives With: Spouse Prior Function Level of Independence: Independent with basic ADLs, Independent with transfers, Independent with gait(was using a cane)  Able to Take Stairs?: Yes(using railings, L LE prosthetic) Driving: No Vocation: Retired Comments: LLE prosthesis, dialysis 3x per week ADL   Vision Baseline Vision/History: No visual deficits Patient Visual Report: No change from baseline Vision Assessment?: No apparent visual deficits Perception  Perception: Within Functional Limits Praxis Praxis: Intact Cognition Overall Cognitive Status: Within Functional Limits for tasks assessed Arousal/Alertness: Awake/alert Orientation Level: Person;Place;Situation Person: Oriented Place: Oriented Situation: Oriented Year: 2018 Month:  December Memory: Appears intact Attention: Sustained Sustained Attention: Appears intact Awareness: Appears intact Safety/Judgment: Appears intact Sensation Sensation Light Touch: Appears Intact Proprioception: Appears Intact Additional Comments: some numbness R distal limb Coordination Gross Motor Movements are Fluid and Coordinated: No Fine Motor Movements are Fluid and Coordinated: Yes Coordination and Movement Description: uncoordinated B LEs secondary to B BKAs Motor  Motor Motor - Skilled Clinical Observations: generalized weakness Mobility    mod A for scoot pivot  Trunk/Postural Assessment  Cervical Assessment Cervical Assessment: Within Functional Limits Thoracic Assessment Thoracic Assessment: Within Functional Limits Lumbar Assessment Lumbar Assessment: Within Functional Limits Postural Control Postural Control: Within Functional Limits  Balance Balance Balance Assessed: Yes Static Sitting Balance Static Sitting - Level of Assistance: 5: Stand by assistance Dynamic Sitting Balance Dynamic Sitting - Level of Assistance: 5: Stand by assistance Static Standing Balance Static Standing - Level of Assistance: 4: Min assist Extremity/Trunk Assessment RUE Assessment RUE Assessment: Within Functional Limits LUE Assessment LUE Assessment: Within Functional Limits   See Function Navigator for Current Functional Status.   Refer to Care Plan for Long Term Goals  Recommendations for other services: None    Discharge Criteria: Patient will be discharged from OT if patient refuses treatment 3 consecutive times without medical reason, if treatment goals not met, if there is a change in medical status, if patient makes no progress towards goals or if patient is discharged from hospital.  The above assessment, treatment plan, treatment alternatives and goals were discussed and mutually agreed upon: by patient  Nicoletta Ba 12/12/2017, 10:41 AM

## 2017-12-12 NOTE — Progress Notes (Signed)
Physical Therapy Session Note  Patient Details  Name: Thomas [name] MRN: [id number] Date of Birth: 08/29/1966  Today's Date: 12/12/2017 PT Individual Time: 5361-4431 PT Individual Time Calculation (min): 55 min   Skilled Therapeutic Interventions/Progress Updates:   Pt in w/c and agreeable to therapy, no c/o pain. Total assist w/c transport to gym 2/2 pt's stated UE fatigue. Practiced sit<>stands x3 in parallel bars w/ mod assist to boost into standing, min guard to maintain standing w/ bilateral UE support. Pt stands w/ increased anterior lean when in standing 2/2 feeling most stable on L prosthetic leg when leaning forward, no LOB noted. Pt w/ increased R residual limb discomfort in standing 2/2 dependent position - resolves w/ seated rest. Performed arm ergometer 10 min @ L5 to work on UE strengthening, moderate increase in work of breathing. Pt w/ good understanding of typical progression of rehab s/p BKA from previous L BKA, able to verbalize importance of UE strength w/o prompting. Issued green TB bands for pt to perform UE strengthening exercises in between therapies including tricep pull downs, chest rows, low rows, and bicep curls in sets of 10. Emphasis on neutral scapular alignment to facilitate strengthening of right muscles. Pt able to return demonstration of exercises safely. Ended session sitting up in w/c, call bell within reach and all needs met.   Therapy Documentation Precautions:  Precautions Precautions: Fall Precaution Comments: wound vac RLE Other Brace/Splint: LLE prothestic Restrictions Weight Bearing Restrictions: Yes RLE Weight Bearing: Non weight bearing  See Function Navigator for Current Functional Status.   Therapy/Group: Individual Therapy  [name] 12/12/2017, 7:41 PM

## 2017-12-12 NOTE — Care Management Note (Signed)
Inpatient Rehabilitation Center Individual Statement of Services  Patient Name:  Thomas [name]  Date:  12/12/2017  Welcome to the [facility].  Our goal is to provide you with an individualized program based on your diagnosis and situation, designed to meet your specific needs.  With this comprehensive rehabilitation program, you will be expected to participate in at least 3 hours of rehabilitation therapies Monday-Friday, with modified therapy programming on the weekends.  Your rehabilitation program will include the following services:  Physical Therapy (PT), Occupational Therapy (OT), 24 hour per day rehabilitation nursing, Neuropsychology, Case Management (Social Worker), Rehabilitation Medicine, Nutrition Services and Pharmacy Services  Weekly team conferences will be held on Wednesday to discuss your progress.  Your Social Worker will talk with you frequently to get your input and to update you on team discussions.  Team conferences with you and your family in attendance may also be held.  Expected length of stay: 10-12 days  Overall anticipated outcome: supervision level  Depending on your progress and recovery, your program may change. Your Social Worker will coordinate services and will keep you informed of any changes. Your Social Worker's name and contact numbers are listed  below.  The following services may also be recommended but are not provided by the [facility]:    [place] will be made to provide these services after discharge if needed.  Arrangements include referral to agencies that provide these services.  Your insurance has been verified to be:  [employer] Your primary doctor is:    Pertinent information will be shared with your doctor and your insurance company.  Social Worker:  Ovidio Kin, [place] or ([phone]  Information  discussed with and copy given to patient by: Elease Hashimoto, 12/12/2017, 10:33 AM

## 2017-12-12 NOTE — Evaluation (Addendum)
Physical Therapy Assessment and Plan  Patient Details  Name: Thomas [name] MRN: [id number] Date of Birth: [birth year]  PT Diagnosis: Muscle weakness and Pain in R distal LE Rehab Potential: Good ELOS: 10-12   Today's Date: 12/12/2017 PT Individual Time: 0800-0900, 1130-1200 PT Individual Time Calculation (min): 60 min  , 30 min  Problem List:  Patient Active Problem List   Diagnosis Date Noted  . Subtherapeutic international normalized ratio (INR)   . History of left below knee amputation ([place]) 12/11/2017  . S/P bilateral below knee amputation ([place]) 12/11/2017  . Hypoxia   . History of supraventricular tachycardia   . Chronic diastolic congestive heart failure ([place])   . Unilateral complete BKA, right, subsequent encounter ([place])   . S/P bilateral BKA (below knee amputation) ([place])   . Acute blood loss anemia   . Anemia of chronic disease   . Leukocytosis   . Post-operative pain   . Non-ischemic cardiomyopathy ([place])   . Subacute osteomyelitis, right ankle and foot ([place])   . Wound infection   . Cutaneous abscess of right foot   . Hypokalemia 09/04/2017  . Diabetic wet gangrene of the foot ([place]) 09/04/2017  . Essential hypertension 09/03/2017  . GERD (gastroesophageal reflux disease) 09/03/2017  . Diabetic foot ulcer ([place]) 09/03/2017  . GIB (gastrointestinal bleeding) 07/26/2017  . Anemia due to end stage renal disease ([place]) 07/26/2017  . PAF (paroxysmal atrial fibrillation) ([place]) 07/26/2017  . Symptomatic anemia 07/26/2017  . Fluid overload 05/05/2017  . Elevated troponin 05/05/2017  . Pulmonary edema 05/05/2017  . ESRD (end stage renal disease) ([place]) 05/05/2017  . HIV disease ([place]) 03/25/2017  . Hypertensive heart disease with end stage renal disease on dialysis ([place]) 03/13/2017  . ESRD on dialysis ([place]) 03/13/2017  . Type 2 diabetes mellitus with complication ([place]) 57/26/2035  . SVT (supraventricular tachycardia) (H[facility])     Past Medical History:  Past Medical History:   Diagnosis Date  . Anemia   . Chronic combined systolic and diastolic CHF (congestive heart failure) ([place])   . Diabetes ([place])   . ESRD (end stage renal disease) [facility])    [facility] [address] T, Th, Sat  . HIV disease ([place])   . Hyperparathyroidism, secondary ([place])   . Hypertension   . Hypertensive heart disease with end stage renal disease on dialysis ([place]) 03/13/2017  . SVT (supraventricular tachycardia) ([place])    ? afib or atrial flutter s/p TEE/DCCV with subsequent ablation due to reoccurrence in [place]  . Type 2 diabetes mellitus ([place])    Past Surgical History:  Past Surgical History:  Procedure Laterality Date  . AMPUTATION Left    foot  . AMPUTATION Right 12/06/2017   Procedure: AMPUTATION BELOW KNEE;  Surgeon: [name], MD;  Location: [place];  Service: Orthopedics;  Laterality: Right;  . APPLICATION OF WOUND VAC Right 09/04/2017   Procedure: APPLICATION OF WOUND VAC;  Surgeon: [name], DPM;  Location: [place];  Service: Podiatry;  Laterality: Right;  . APPLICATION OF WOUND VAC  12/06/2017   Procedure: APPLICATION OF WOUND VAC;  Surgeon: [name], MD;  Location: [place];  Service: Orthopedics;;  . COLONOSCOPY WITH PROPOFOL N/A 07/30/2017   Procedure: COLONOSCOPY WITH PROPOFOL;  Surgeon: [name], MD;  Location: [place];  Service: Endoscopy;  Laterality: N/A;  . ESOPHAGOGASTRODUODENOSCOPY (EGD) WITH PROPOFOL N/A 07/28/2017   Procedure: ESOPHAGOGASTRODUODENOSCOPY (EGD) WITH PROPOFOL;  Surgeon: [name], MD;  Location: [place];  Service: Endoscopy;  Laterality: N/A;  .  FLEXIBLE SIGMOIDOSCOPY N/A 07/27/2017   Procedure: FLEXIBLE SIGMOIDOSCOPY;  Surgeon: [name], MD;  Location: [facility] ENDOSCOPY;  Service: Endoscopy;  Laterality: N/A;  . GRAFT APPLICATION Right 69/62/9528   Procedure: SKIN GRAFT APPLICATION RIGHT FOOT;  Surgeon: [name], DPM;  Location: [place];  Service: Podiatry;  Laterality: Right;  . I&D EXTREMITY Right 09/04/2017   Procedure:  IRRIGATION AND DEBRIDEMENT EXTREMITY;  Surgeon: [name], DPM;  Location: [place];  Service: Podiatry;  Laterality: Right;  . I&D EXTREMITY Right 11/12/2017   Procedure: IRRIGATION AND DEBRIDEMENT ULCER RIGHT FOOT;  Surgeon: [name], DPM;  Location: [place];  Service: Podiatry;  Laterality: Right;  . RIGHT AV FISTULA PLACEMENT  10/19/2012  . WOUND DEBRIDEMENT N/A 09/24/2017   Procedure: DEBRIDEMENT WOUND;  Surgeon: [name], DPM;  Location: [place];  Service: Podiatry;  Laterality: N/A;    Assessment & Plan Clinical Impression: Patient is a 51 y.o. year old male with history of diabetes mellitus, end-stage renal disease with hemodialysis, SVT and maintained on chronic Coumadin, HIV positive, diastolic congestive heart failure, left BKA3 years ago in [place].Per chart review and patient,patient lives with wife and mother-in-law. Used a left prosthesis provided by [employer] prior to admission. Family assistance provided as needed.Presented 12/05/2017 with nonhealing right foot wound. X-rays and imaging of right foot showed multifocal marrow edema in the calcaneus and in the distal 5 cm of the fibula consistent with osteomyelitis. Large skin ulceration along the lateral aspect of the ankle extending almost to the peroneal tendons. 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 Sharol Given and application of wound VAC. Hospital course pain management. Hemodialysis ongoing as per renal services. Noted acute on chronic anemia 6.8 he was transfused. Chronic Coumadin has been resumed. Physicaland Occupationaltherapy evaluationscompleted 12/08/2017 with recommendations of physical medicine rehabilitation consult.Patient was admitted for a comprehensive rehabilitation program  Patient transferred to CIR on 12/11/2017 .   Patient currently requires min with mobility secondary to muscle weakness, decreased cardiorespiratoy endurance and  decreased standing balance and difficulty maintaining precautions.  Prior to hospitalization, patient was modified independent  with mobility and lived with Spouse in a House home.  Home access is  Ramped entrance.  Patient will benefit from skilled PT intervention to maximize safe functional mobility, minimize fall risk and decrease caregiver burden for planned discharge intermittent supervision for safety.  Anticipate patient will benefit from follow up [place] at discharge.  PT - End of Session Activity Tolerance: Tolerates 30+ min activity with multiple rests;Decreased this session Endurance Deficit: Yes PT Assessment Rehab Potential (ACUTE/IP ONLY): Good PT Barriers to Discharge: Home environment access/layout PT Barriers to Discharge Comments: bed/bath on second floor PT Patient demonstrates impairments in the following area(s): Balance;Behavior;Edema;Endurance;Motor;Pain;Skin Integrity PT Transfers Functional Problem(s): Bed Mobility;Bed to Chair;Furniture;Car PT Locomotion Functional Problem(s): Ambulation;Wheelchair Mobility;Stairs PT Plan PT Intensity: Minimum of 1-2 x/day ,45 to 90 minutes PT Frequency: 5 out of 7 days PT Duration Estimated Length of Stay: 10-12 PT Treatment/Interventions: Ambulation/gait training;Pain management;Stair training;Balance/vestibular training;DME/adaptive equipment instruction;Patient/family education;Therapeutic Activities;Wheelchair propulsion/positioning;Psychosocial support;Therapeutic Exercise;Community reintegration;Functional mobility training;Skin care/wound management;UE/LE Strength taining/ROM;Discharge planning;Splinting/orthotics;UE/LE Coordination activities;Neuromuscular re-education;Disease management/prevention PT Transfers Anticipated Outcome(s): supervision PT Locomotion Anticipated Outcome(s): supervision  PT Recommendation Recommendations for Other Services: Neuropsych consult Follow Up Recommendations: Home health PT Patient  destination: Home Equipment Recommended: To be determined  Skilled Therapeutic Intervention Session 1: Evaluation completed (see details above and below) with education on PT POC and goals and  individual treatment initiated with focus on functional transfers, w/c propulsion and education. Pt supine upon PT arrival, agreeable to therapy tx and reports 6/10 pain in R distal LE. Pt transferred from supine>sitting EOB with min assist. Pt donned L LE prosthesis with supervision working on seated balance. Pt performed sit<>stand transfer from elevated bed using RW and L LE prosthetic for support, requiring max assist to boost up into standing, able to maintain static standing balance about 10-15 sec with min assist. Pt transferred from bed>w/c squat pivot transfer with min assist, verbal cues for techniques. Pt performed w/c propulsion using B UEs x 150 ft with supervision. Pt performed squat pivot transfer from w/c<>car with min assist. Pt educated on w/c parts management and proper set up for w/c for transfers. Pt left seated in w/c at end of session with needs in reach.   Session 2: Pt seated in bathroom upon PT arrival, attempting to transfer to toilet without assist. When therapist asked why he did not call for help he stated "I thought I could do it by myself." Therapist reiterated the need to call for staff when getting out of w/c. Pt transferred from w/c<>bedside commode with min assist and performed all toileting with supervision. Pt transferred back to w/c and propelled to sink to wash hands. Pt worked on w/c propulsion and propelled w/c throughout unit >200 ft with supervision and verbal cues for techniques. Pt left seated in w/c at end of session with needs in reach.     PT Evaluation Precautions/Restrictions Precautions Precautions: Fall Precaution Comments: wound vac RLE Other Brace/Splint: LLE prothestic Restrictions Weight Bearing Restrictions: Yes RLE Weight Bearing: Non weight  bearing General   Vital Signs  Home Living/Prior Functioning Home Living Available Help at Discharge: Family;Available 24 hours/day Type of Home: House Home Access: Ramped entrance Home Layout: 1/2 bath on main level;Bed/bath upstairs;Two level Alternate Level Stairs-Number of Steps: 14 steps to second floor Bathroom Shower/Tub: Tub/shower unit(upstairs)  Lives With: Spouse Prior Function Level of Independence: Independent with basic ADLs;Independent with transfers;Independent with gait(was using a cane)  Able to Take Stairs?: Yes(using railings, L LE prosthetic) Driving: No Vocation: Retired Comments: LLE prosthesis, dialysis 3x per week Vision/Perception  Perception Perception: Within Functional Limits Praxis Praxis: Intact  Cognition Overall Cognitive Status: Within Functional Limits for tasks assessed Arousal/Alertness: Awake/alert Orientation Level: Oriented X4 Attention: Sustained Sustained Attention: Appears intact Memory: Appears intact Awareness: Appears intact Safety/Judgment: Appears intact Sensation Sensation Light Touch: Appears Intact Proprioception: Appears Intact Additional Comments: some numbness R distal limb Coordination Gross Motor Movements are Fluid and Coordinated: No Fine Motor Movements are Fluid and Coordinated: Yes Coordination and Movement Description: uncoordinated B LEs secondary to B BKAs Motor  Motor Motor - Skilled Clinical Observations: generalized weakness  Trunk/Postural Assessment  Cervical Assessment Cervical Assessment: Within Functional Limits Thoracic Assessment Thoracic Assessment: Within Functional Limits Lumbar Assessment Lumbar Assessment: Within Functional Limits Postural Control Postural Control: Within Functional Limits  Balance Balance Balance Assessed: Yes Static Sitting Balance Static Sitting - Level of Assistance: 5: Stand by assistance Dynamic Sitting Balance Dynamic Sitting - Level of Assistance: 5: Stand  by assistance Static Standing Balance Static Standing - Level of Assistance: 4: Min assist Extremity Assessment  RLE Assessment RLE Assessment: Exceptions to WFL(limited knee flexion/extension ROM secondary to pain and tightness, strength grossly 4/5 at hip and knee) LLE Assessment LLE Assessment: (strength grossly 4/5 at hip and knee, below knee prosthetic)   See Function Navigator for Current Functional Status.   Refer  to Care Plan for Long Term Goals  Recommendations for other services: Neuropsych  Discharge Criteria: Patient will be discharged from PT if patient refuses treatment 3 consecutive times without medical reason, if treatment goals not met, if there is a change in medical status, if patient makes no progress towards goals or if patient is discharged from hospital.  The above assessment, treatment plan, treatment alternatives and goals were discussed and mutually agreed upon: by patient  Netta Corrigan, PT, DPT 12/12/2017, 9:35 AM

## 2017-12-12 NOTE — Progress Notes (Signed)
Social Work Assessment and Plan Social Work Assessment and Plan  Patient Details  Name: Thomas [name] MRN: [id number] Date of Birth: 20-Sep-1966  Today's Date: 12/12/2017  Problem List:  Patient Active Problem List   Diagnosis Date Noted  . Subtherapeutic international normalized ratio (INR)   . History of left below knee amputation ([place]) 12/11/2017  . S/P bilateral below knee amputation ([place]) 12/11/2017  . Hypoxia   . History of supraventricular tachycardia   . Chronic diastolic congestive heart failure ([place])   . Unilateral complete BKA, right, subsequent encounter ([place])   . S/P bilateral BKA (below knee amputation) ([place])   . Acute blood loss anemia   . Anemia of chronic disease   . Leukocytosis   . Post-operative pain   . Non-ischemic cardiomyopathy ([place])   . Subacute osteomyelitis, right ankle and foot ([place])   . Wound infection   . Cutaneous abscess of right foot   . Hypokalemia 09/04/2017  . Diabetic wet gangrene of the foot ([place]) 09/04/2017  . Essential hypertension 09/03/2017  . GERD (gastroesophageal reflux disease) 09/03/2017  . Diabetic foot ulcer ([place]) 09/03/2017  . GIB (gastrointestinal bleeding) 07/26/2017  . Anemia due to end stage renal disease ([place]) 07/26/2017  . PAF (paroxysmal atrial fibrillation) ([place]) 07/26/2017  . Symptomatic anemia 07/26/2017  . Fluid overload 05/05/2017  . Elevated troponin 05/05/2017  . Pulmonary edema 05/05/2017  . ESRD (end stage renal disease) ([place]) 05/05/2017  . HIV disease ([place]) 03/25/2017  . Hypertensive heart disease with end stage renal disease on dialysis ([place]) 03/13/2017  . ESRD on dialysis ([place]) 03/13/2017  . Type 2 diabetes mellitus with complication ([place]) 96/22/2979  . SVT (supraventricular tachycardia) ([facility])    Past Medical History:  Past Medical History:  Diagnosis Date  . Anemia   . Chronic combined systolic and diastolic CHF (congestive heart failure) ([place])   . Diabetes ([place])   . ESRD (end stage renal disease)  [facility])    [facility] [address] T, Th, Sat  . HIV disease ([place])   . Hyperparathyroidism, secondary ([place])   . Hypertension   . Hypertensive heart disease with end stage renal disease on dialysis ([place]) 03/13/2017  . SVT (supraventricular tachycardia) ([place])    ? afib or atrial flutter s/p TEE/DCCV with subsequent ablation due to reoccurrence in [place]  . Type 2 diabetes mellitus ([place])    Past Surgical History:  Past Surgical History:  Procedure Laterality Date  . AMPUTATION Left    foot  . AMPUTATION Right 12/06/2017   Procedure: AMPUTATION BELOW KNEE;  Surgeon: [name], MD;  Location: [place];  Service: Orthopedics;  Laterality: Right;  . APPLICATION OF WOUND VAC Right 09/04/2017   Procedure: APPLICATION OF WOUND VAC;  Surgeon: [name], DPM;  Location: [place];  Service: Podiatry;  Laterality: Right;  . APPLICATION OF WOUND VAC  12/06/2017   Procedure: APPLICATION OF WOUND VAC;  Surgeon: [name], MD;  Location: [place];  Service: Orthopedics;;  . COLONOSCOPY WITH PROPOFOL N/A 07/30/2017   Procedure: COLONOSCOPY WITH PROPOFOL;  Surgeon: [name], MD;  Location: [place];  Service: Endoscopy;  Laterality: N/A;  . ESOPHAGOGASTRODUODENOSCOPY (EGD) WITH PROPOFOL N/A 07/28/2017   Procedure: ESOPHAGOGASTRODUODENOSCOPY (EGD) WITH PROPOFOL;  Surgeon: [name], MD;  Location: [place];  Service: Endoscopy;  Laterality: N/A;  . FLEXIBLE SIGMOIDOSCOPY N/A 07/27/2017   Procedure: FLEXIBLE SIGMOIDOSCOPY;  Surgeon: [name], MD;  Location: [facility] ENDOSCOPY;  Service: Endoscopy;  Laterality: N/A;  . GRAFT APPLICATION  Right 11/12/2017   Procedure: SKIN GRAFT APPLICATION RIGHT FOOT;  Surgeon: [name], DPM;  Location: [place];  Service: Podiatry;  Laterality: Right;  . I&D EXTREMITY Right 09/04/2017   Procedure: IRRIGATION AND DEBRIDEMENT EXTREMITY;  Surgeon: [name], DPM;  Location: [place];  Service: Podiatry;  Laterality: Right;  . I&D EXTREMITY Right 11/12/2017    Procedure: IRRIGATION AND DEBRIDEMENT ULCER RIGHT FOOT;  Surgeon: [name], DPM;  Location: [place];  Service: Podiatry;  Laterality: Right;  . RIGHT AV FISTULA PLACEMENT  10/19/2012  . WOUND DEBRIDEMENT N/A 09/24/2017   Procedure: DEBRIDEMENT WOUND;  Surgeon: [name], DPM;  Location: [place];  Service: Podiatry;  Laterality: N/A;   Social History:  reports that  has never smoked. he has never used smokeless tobacco. He reports that he does not drink alcohol or use drugs.  Family / Support Systems Marital Status: Married Patient Roles: Spouse, Parent Spouse/Significant Other: [name] [phone] Other Supports: Mother in-law Anticipated Caregiver: wife Ability/Limitations of Caregiver: Wife retired and able to assist, was prior to admission. Mother in-law available also if needed Caregiver Availability: 24/7 Family Dynamics: Close knit family moved down here a year ago and are still transitioning to the [place] from [place]. Moved closer to mother in-law who is here. Pt voiced he is having pain and trying to move forward in this.  Social History Preferred language: English Religion: None Cultural Background: No issues Education: High School Read: Yes Write: Yes Employment Status: Disabled Freight forwarder Issues: No issues Guardian/Conservator: None-according to MD pt is capable of making his own decisions while here   Abuse/Neglect Abuse/Neglect Assessment Can Be Completed: Yes Physical Abuse: Denies Verbal Abuse: Denies Sexual Abuse: Denies Exploitation of patient/patient's resources: Denies Self-Neglect: Denies  Emotional Status Pt's affect, behavior adn adjustment status: Pt is motivated to do well and get home as soon as possible. He does want to be able to transfer and ambulate short distances before leaves here. He will have his wife assist wiht self care she did prior to admission. He says he wants to be independent but his actions say something  else. Recent Psychosocial Issues: other health issues and still getting used to the move Pyschiatric History: No history deferred depression screen due to still adjusting to rehab and the therapies up here. He would benefit from seeing neuro-psych while here due to now bilateral BKA's. Will get input from team members. Substance Abuse History: No issues  Patient / Family Perceptions, Expectations & Goals Pt/Family understanding of illness & functional limitations: Pt and wife can explain his amputee and treatment plan going forward. He is hopeful he will heal and get another prothesis and begin the training for this. He realizes this process wil be harder due to now bilateral but is determined to try. Premorbid pt/family roles/activities: Husband, father, retiree, son, etc Anticipated changes in roles/activities/participation: resume Pt/family expectations/goals: Pt states: " I hope to be doing for myself I am a big man and need to be transferring and ambulating some."  Wife states: " I will assist I have with the last one and it can't be much different."  [employer]: Other (Comment)([facility]-T, TH & Sat HD) Premorbid Home Care/DME Agencies: Other (Comment)(has equipment from last surgery) Transportation available at discharge: Wife drives Resource referrals recommended: Neuropsychology, Support group (specify)  Discharge Planning Living Arrangements: Spouse/significant other Support Systems: Spouse/significant other, Parent, Other relatives, Friends/neighbors Type of Residence: Private residence Insurance Resources: [employer], Multimedia programmer (  specify)(BCBS) Financial Resources: SSD, Family Support Financial Screen Referred: No Living Expenses: Own Money Management: Patient, Spouse Does the patient have any problems obtaining your medications?: No Home Management: Wife Patient/Family Preliminary Plans: Return home with wife who is able to assist him  and was prior to admission. She plans to be here daily and observe him in therapies. Discussed team evaluating him and setting goals. Pt may need a new wheelchair since one he has is old. Social Work Anticipated Follow Up Needs: HH/OP, Support Group  Clinical Impression Pleasant gentleman who is motivated and has been through this before with his other amputation. His wife is very involved and has been assisting him prior to this surgery. Will work with both on discharge needs and make referral for neuro-psych to see while here.  Elease Hashimoto 12/12/2017, 12:53 PM

## 2017-12-12 NOTE — Progress Notes (Signed)
[place] PHYSICAL MEDICINE & REHABILITATION     PROGRESS NOTE  Subjective/Complaints:  Patient seen lying in bed this morning. He states he slept well overnight. He is ready to begin therapies today.  ROS: Denies CP, SOB, nausea, vomiting, diarrhea.  Objective: Vital Signs: Blood pressure 120/81, pulse 84, temperature 97.7 F (36.5 C), temperature source Oral, resp. rate 17, SpO2 97 %. No results found. Recent Labs    12/10/17 0644 12/11/17 0853  WBC 10.5 9.9  HGB 8.4* 8.3*  HCT 27.5* 26.5*  PLT 314 362   Recent Labs    12/11/17 1100  NA 136  K 3.4*  CL 95*  GLUCOSE 177*  BUN 49*  CREATININE 9.66*  CALCIUM 9.8   CBG (last 3)  Recent Labs    12/11/17 1627 12/11/17 2202 12/12/17 0704  GLUCAP 144* 202* 147*    Wt Readings from Last 3 Encounters:  12/11/17 116.7 kg (257 lb 4.4 oz)  12/05/17 129.3 kg (285 lb)  11/12/17 129.3 kg (285 lb)    Physical Exam:  BP 120/81 (BP Location: Left Arm)   Pulse 84   Temp 97.7 F (36.5 C) (Oral)   Resp 17   SpO2 97%  Constitutional: He appearswell-developed. NAD HENT: Normocephalic.  Atraumatic Eyes:EOMare normal. No discharge Cardiovascular:Rate controlled. No JVD Respiratory:Effort normaland breath sounds normal.  HU:DJSHF sounds are normal. He exhibitsno distension. Neurological: He isalertand oriented. Motor: 5/5 in BUE, 5/5 Left HF RLE: HF: 3+/5 (pain inhibition).   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 Physiatrist is providing close team supervision and 24 hour management of active medical problems listed below. Physiatrist and rehab team continue to assess barriers to discharge/monitor patient progress toward functional and medical goals.  Function:  Bathing Bathing position       Bathing parts      Bathing assist        Upper Body Dressing/Undressing Upper body dressing                    Upper body assist        Lower Body Dressing/Undressing Lower body dressing                                  Lower body assist        Toileting Toileting          Toileting assist     Transfers Chair/bed transfer             Locomotion Ambulation           Wheelchair          Cognition Comprehension Comprehension assist level: Follows basic conversation/direction with extra time/assistive device  Expression Expression assist level: Expresses basic needs/ideas: With extra time/assistive device  Social Interaction    Problem Solving    Memory      Medical Problem List and Plan: 1.Decreased functional mobilitysecondary to 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 DVT Prophylaxis/Anticoagulation: Chronic Coumadin for history of SVT.Monitor for any bleeding episodes   INR subtherapeutic on 12/14 3. Pain Management:Oxycodone as needed 4. Mood:Provide emotional support 5. Neuropsych: This patientiscapable of making decisions  on hisown behalf. 6. Skin/Wound Care:Routine skin checks 7. Fluids/Electrolytes/Nutrition:Routine I&O's  8.ESRD. Continue hemodialysis as per renal services 9.Acute on chronic anemia. Cont Aranesp   Hemoglobin 8.3 on 12/13 10.HIV.Continue current retroviral therapy 11.Diabetes mellitus with peripheralneuropathy. Hemoglobin A1c 7.7.   Lantus 25 unit QHS.   Check blood sugars before meals and QHS   Monitor with increased mobility 12.HTN/SVT.   Norvasc 5mg  daily, lisinopril 40 mg daily, Lopressor 25 mg twice a day.   Monitor with increased mobility 13.Congestive heart failure: Monitor for any signs of fluid overload There were no vitals filed for this visit.  LOS (Days) 1 A FACE TO FACE EVALUATION WAS  PERFORMED  Ankit [name] 12/12/2017 9:14 AM

## 2017-12-13 LAB — CBC
HCT: 25.3 % — ABNORMAL LOW (ref 39.0–52.0)
Hemoglobin: 8.1 g/dL — ABNORMAL LOW (ref 13.0–17.0)
MCH: 28.4 pg (ref 26.0–34.0)
MCHC: 32 g/dL (ref 30.0–36.0)
MCV: 88.8 fL (ref 78.0–100.0)
Platelets: 322 10*3/uL (ref 150–400)
RBC: 2.85 MIL/uL — ABNORMAL LOW (ref 4.22–5.81)
RDW: 18.8 % — ABNORMAL HIGH (ref 11.5–15.5)
WBC: 9.3 10*3/uL (ref 4.0–10.5)

## 2017-12-13 LAB — RENAL FUNCTION PANEL
Albumin: 2.3 g/dL — ABNORMAL LOW (ref 3.5–5.0)
Anion gap: 13 (ref 5–15)
BUN: 50 mg/dL — ABNORMAL HIGH (ref 6–20)
CALCIUM: 9.8 mg/dL (ref 8.9–10.3)
CO2: 25 mmol/L (ref 22–32)
CREATININE: 8.83 mg/dL — AB (ref 0.61–1.24)
Chloride: 95 mmol/L — ABNORMAL LOW (ref 101–111)
GFR calc Af Amer: 7 mL/min — ABNORMAL LOW (ref >60)
GFR calc non Af Amer: 6 mL/min — ABNORMAL LOW (ref >60)
GLUCOSE: 214 mg/dL — AB (ref 65–99)
Phosphorus: 7.9 mg/dL — ABNORMAL HIGH (ref 2.5–4.6)
Potassium: 3.6 mmol/L (ref 3.5–5.1)
SODIUM: 133 mmol/L — AB (ref 135–145)

## 2017-12-13 LAB — PROTIME-INR
INR: 1.56
Prothrombin Time: 18.6 seconds — ABNORMAL HIGH (ref 11.4–15.2)

## 2017-12-13 LAB — GLUCOSE, CAPILLARY
GLUCOSE-CAPILLARY: 206 mg/dL — AB (ref 65–99)
GLUCOSE-CAPILLARY: 261 mg/dL — AB (ref 65–99)
Glucose-Capillary: 216 mg/dL — ABNORMAL HIGH (ref 65–99)

## 2017-12-13 MED ORDER — OXYCODONE HCL 5 MG PO TABS
ORAL_TABLET | ORAL | Status: AC
  Administered 2017-12-13: 10 mg via ORAL
  Filled 2017-12-13: qty 2

## 2017-12-13 MED ORDER — DIPHENHYDRAMINE HCL 25 MG PO CAPS
25.0000 mg | ORAL_CAPSULE | Freq: Once | ORAL | Status: AC
  Administered 2017-12-13: 25 mg via ORAL

## 2017-12-13 MED ORDER — DIPHENHYDRAMINE HCL 25 MG PO CAPS
ORAL_CAPSULE | ORAL | Status: AC
  Administered 2017-12-13: 25 mg via ORAL
  Filled 2017-12-13: qty 1

## 2017-12-13 MED ORDER — INSULIN GLARGINE 100 UNIT/ML ~~LOC~~ SOLN
30.0000 [IU] | Freq: Every day | SUBCUTANEOUS | Status: DC
  Administered 2017-12-13 – 2017-12-19 (×7): 30 [IU] via SUBCUTANEOUS
  Filled 2017-12-13 (×7): qty 0.3

## 2017-12-13 MED ORDER — DARBEPOETIN ALFA 200 MCG/0.4ML IJ SOSY
PREFILLED_SYRINGE | INTRAMUSCULAR | Status: AC
  Administered 2017-12-13: 200 ug via INTRAVENOUS
  Filled 2017-12-13: qty 0.4

## 2017-12-13 MED ORDER — DOXERCALCIFEROL 4 MCG/2ML IV SOLN
INTRAVENOUS | Status: AC
  Administered 2017-12-13: 5 ug via INTRAVENOUS
  Filled 2017-12-13: qty 4

## 2017-12-13 MED ORDER — WARFARIN SODIUM 7.5 MG PO TABS
7.5000 mg | ORAL_TABLET | Freq: Once | ORAL | Status: AC
  Administered 2017-12-13: 7.5 mg via ORAL
  Filled 2017-12-13: qty 1

## 2017-12-13 MED ORDER — ACETAMINOPHEN 325 MG PO TABS
ORAL_TABLET | ORAL | Status: AC
  Administered 2017-12-13: 650 mg via ORAL
  Filled 2017-12-13: qty 2

## 2017-12-13 NOTE — Progress Notes (Signed)
Dialysis treatment completed.  2500 mL ultrafiltrated and net fluid removal 2000 mL.    Patient status unchanged. Lung sounds diminished to ausculation in all fields. Generalized non pitting edema. Cardiac: NSR.  Disconnected lines and removed needles.  Pressure held for 10 minutes and band aid/gauze dressing applied.  Report given to bedside RN, Wells Guiles.

## 2017-12-13 NOTE — Progress Notes (Signed)
ANTICOAGULATION CONSULT NOTE - Follow-Up  Pharmacy Consult for warfarin Indication: atrial fibrillation  No Known Allergies  Vital Signs: Temp: 97.7 F (36.5 C) (12/15 0548) Temp Source: Oral (12/15 0548) BP: 130/78 (12/15 0548) Pulse Rate: 85 (12/15 0548)  Labs: Recent Labs    12/11/17 0853 12/11/17 1100 12/12/17 0519 12/13/17 0540  HGB 8.3*  --   --   --   HCT 26.5*  --   --   --   PLT 362  --   --   --   LABPROT 17.4*  --  17.5* 18.6*  INR 1.44  --  1.45 1.56  CREATININE  --  9.66*  --   --     Estimated Creatinine Clearance: 13 mL/min (A) (by C-G formula based on SCr of 9.66 mg/dL (H)).   Assessment: 63 YOM with AFib who was admitted with a supratherapeutic INR. INR was 3.46 on 12/7- patient received vitamin K 2.5mg  PO to reverse INR to <2 for surgery on 12/8. Warfarin was restarted on 12/9, then held 12/10 d/t CBC drop and restarted on the 11th. The patient was re-educated on warfarin on 12/10/17. PTA Home dose of Warfarin was 10mg  daily except 7.5mg  on TTSat  INR subtherapeutic today at 1.56.  No CBC since 13th (low but stable) and no bleeding noted at this time.   Goal of Therapy:  INR 2-3 Monitor platelets by anticoagulation protocol: Yes   Plan:  Repeat warfarin 7.5mg  x 1 dose Monitor daily INR, CBC, clinical course, s/sx of bleed, PO intake, DDI   Bertis Ruddy, PharmD Pharmacy Resident Pager #: [phone] 12/13/2017 10:17 AM

## 2017-12-13 NOTE — Procedures (Signed)
Pt is sp R BKA, now on CIR.  Down 1kg by wts today, but was down more than that earlier this hosp stay.  Plan for UF goal 3 L today.    I was present at this dialysis session, have reviewed the session itself and made  appropriate changes [name] MD [place] pager [phone]   12/13/2017, 3:15 PM

## 2017-12-13 NOTE — Progress Notes (Signed)
[place] PHYSICAL MEDICINE & REHABILITATION     PROGRESS NOTE  Subjective/Complaints:  Patient sitting in bed without new complaints.  Pain under control  ROS: pt denies nausea, vomiting, diarrhea, cough, shortness of breath or chest pain   Objective: Vital Signs: Blood pressure 130/78, pulse 85, temperature 97.7 F (36.5 C), temperature source Oral, resp. rate 18, SpO2 98 %. No results found. Recent Labs    12/11/17 0853  WBC 9.9  HGB 8.3*  HCT 26.5*  PLT 362   Recent Labs    12/11/17 1100  NA 136  K 3.4*  CL 95*  GLUCOSE 177*  BUN 49*  CREATININE 9.66*  CALCIUM 9.8   CBG (last 3)  Recent Labs    12/12/17 1608 12/12/17 2134 12/13/17 0659  GLUCAP 175* 287* 206*    Wt Readings from Last 3 Encounters:  12/11/17 116.7 kg (257 lb 4.4 oz)  12/05/17 129.3 kg (285 lb)  11/12/17 129.3 kg (285 lb)    Physical Exam:  BP 130/78 (BP Location: Left Arm)   Pulse 85   Temp 97.7 F (36.5 C) (Oral)   Resp 18   SpO2 98%  Constitutional: He appearswell-developed. NAD HENT: Normocephalic.  Atraumatic Eyes:EOMare normal. No discharge Cardiovascular:Rate controlled without JVD Respiratory:CTA Bilaterally without wheezes or rales. Normal effort .  RS:WNIOE sounds are normal. He exhibitsno distension. Neurological: He isalertand oriented. Motor: 5/5 in BUE, 5/5 Left HF RLE: HF: 3+/5 (continued pain in the vision).   Skin: Right BKA dressed and covered with Ace wrap. old left BKA is well healed  Assessment/Plan: 1. 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 Physiatrist is providing close team supervision and 24 hour management of active medical problems listed below. Physiatrist and rehab team continue to assess barriers to discharge/monitor patient progress toward functional and medical goals.  Function:  Bathing Bathing  position   Position: Wheelchair/chair at sink  Bathing parts Body parts bathed by patient: Right arm, Left arm, Chest, Abdomen, Front perineal area, Right upper leg, Left upper leg Body parts bathed by helper: Buttocks  Bathing assist Assist Level: Supervision or verbal cues      Upper Body Dressing/Undressing Upper body dressing   What is the patient wearing?: Hospital gown, Orthosis             Orthosis activity level: Performed by helper  Upper body assist Assist Level: Touching or steadying assistance(Pt > 75%)      Lower Body Dressing/Undressing Lower body dressing   What is the patient wearing?: Hospital Gown                              Lower body assist        Toileting Toileting Toileting activity did not occur: No continent bowel/bladder event        Toileting assist     Transfers Chair/bed transfer   Chair/bed transfer method: Squat pivot Chair/bed transfer assist level: Touching or steadying assistance (Pt > 75%) Chair/bed transfer assistive device: Armrests     Locomotion Ambulation Ambulation activity did not occur: Safety/medical concerns(B BKA, poor standing with L prosthesis)         Wheelchair   Type: Manual Max wheelchair distance: 250' Assist Level: Supervision or verbal cues  Cognition Comprehension Comprehension assist level: Follows complex conversation/direction with no assist  Expression Expression assist  level: Expresses complex ideas: With extra time/assistive device  Social Interaction Social Interaction assist level: Interacts appropriately with others - No medications needed.  Problem Solving Problem solving assist level: Solves complex problems: Recognizes & self-corrects  Memory Memory assist level: Complete Independence: No helper    Medical Problem List and Plan: 1.Decreased functional mobilitysecondary to right BKA 12/01/2017 as well as history of left BKA 3 years ago.Patient does have a left prosthesis    Begin CIR 2. DVT Prophylaxis/Anticoagulation: Chronic Coumadin for history of SVT.Monitor for any bleeding episodes   INR subtherapeutic on 12/15 3. Pain Management:Oxycodone as needed 4. Mood:Provide emotional support 5. Neuropsych: This patientiscapable of making decisions on hisown behalf. 6. Skin/Wound Care:Routine skin checks 7. Fluids/Electrolytes/Nutrition:Routine I&O's  8.ESRD. Continue hemodialysis as per renal services 9.Acute on chronic anemia. Cont Aranesp   Hemoglobin 8.3 on 12/13 10.HIV.Continue current retroviral therapy 11.Diabetes mellitus with peripheralneuropathy. Hemoglobin A1c 7.7.   -Sugar remains poorly controlled.    Marland Kitchen   He might be someone who benefits from twice daily Lantus insulin to better control his p.m. numbers although today's morning sugar was elevated also   -Increase Lantus tonight to 30 units  12.HTN/SVT.   Norvasc 5mg  daily, lisinopril 40 mg daily, Lopressor 25 mg twice a day.   Monitor with increased mobility 13.Congestive heart failure: Monitor for any signs of fluid overload.  Regular weights per nephrology    LOS (Days) 2 A FACE TO FACE EVALUATION WAS PERFORMED  Vinny Taranto T 12/13/2017 8:42 AM

## 2017-12-13 NOTE — Progress Notes (Signed)
Patient arrived to unit per bed.  Reviewed treatment plan and this RN agrees.  Report received from bedside RN, Wells Guiles.  Consent verified.  Patient A & O X 4. Lung sounds diminished to ausculation in all fields. Generalized non pitting edema. Cardiac: NSR.  Prepped RLAVF with alcohol and cannulated with two 15 gauge needles.  Pulsation of blood noted.  Flushed access well with saline per protocol.  Connected and secured lines and initiated tx at 1410.  UF goal of 3500 mL and net fluid removal of 3000 mL.  Will continue to monitor.

## 2017-12-14 ENCOUNTER — Inpatient Hospital Stay (HOSPITAL_COMMUNITY): Payer: Medicare Other

## 2017-12-14 LAB — CBC
HCT: 26.9 % — ABNORMAL LOW (ref 39.0–52.0)
HEMOGLOBIN: 8.3 g/dL — AB (ref 13.0–17.0)
MCH: 27.9 pg (ref 26.0–34.0)
MCHC: 30.9 g/dL (ref 30.0–36.0)
MCV: 90.6 fL (ref 78.0–100.0)
Platelets: 293 10*3/uL (ref 150–400)
RBC: 2.97 MIL/uL — AB (ref 4.22–5.81)
RDW: 19.2 % — ABNORMAL HIGH (ref 11.5–15.5)
WBC: 7.4 10*3/uL (ref 4.0–10.5)

## 2017-12-14 LAB — GLUCOSE, CAPILLARY
GLUCOSE-CAPILLARY: 147 mg/dL — AB (ref 65–99)
Glucose-Capillary: 332 mg/dL — ABNORMAL HIGH (ref 65–99)
Glucose-Capillary: 89 mg/dL (ref 65–99)
Glucose-Capillary: 165 mg/dL — ABNORMAL HIGH (ref 65–99)

## 2017-12-14 LAB — PROTIME-INR
INR: 1.57
Prothrombin Time: 18.7 seconds — ABNORMAL HIGH (ref 11.4–15.2)

## 2017-12-14 MED ORDER — INSULIN ASPART 100 UNIT/ML ~~LOC~~ SOLN
6.0000 [IU] | Freq: Three times a day (TID) | SUBCUTANEOUS | Status: DC
  Administered 2017-12-14 – 2017-12-20 (×16): 6 [IU] via SUBCUTANEOUS

## 2017-12-14 MED ORDER — WARFARIN SODIUM 5 MG PO TABS
10.0000 mg | ORAL_TABLET | Freq: Once | ORAL | Status: AC
  Administered 2017-12-14: 10 mg via ORAL
  Filled 2017-12-14: qty 2

## 2017-12-14 NOTE — Progress Notes (Signed)
ANTICOAGULATION CONSULT NOTE - Follow-Up  Pharmacy Consult for warfarin Indication: atrial fibrillation  No Known Allergies  Vital Signs: Temp: 98.3 F (36.8 C) (12/16 0506) Temp Source: Oral (12/16 0506) BP: 133/72 (12/16 0506) Pulse Rate: 81 (12/16 0506)  Labs: Recent Labs    12/11/17 0853 12/11/17 1100 12/12/17 0519 12/13/17 0540 12/13/17 1349 12/13/17 1423 12/14/17 0624  HGB 8.3*  --   --   --  8.1*  --  8.3*  HCT 26.5*  --   --   --  25.3*  --  26.9*  PLT 362  --   --   --  322  --  293  LABPROT 17.4*  --  17.5* 18.6*  --   --  18.7*  INR 1.44  --  1.45 1.56  --   --  1.57  CREATININE  --  9.66*  --   --   --  8.83*  --     Estimated Creatinine Clearance: 14.4 mL/min (A) (by C-G formula based on SCr of 8.83 mg/dL (H)).   Assessment: 13 YOM with AFib who was admitted with a supratherapeutic INR. INR was 3.46 on 12/7- patient received vitamin K 2.5mg  PO to reverse INR to <2 for surgery on 12/8. Warfarin was restarted on 12/9, then held 12/10 d/t CBC drop and restarted on the 11th. The patient was re-educated on warfarin on 12/10/17. PTA Home dose of Warfarin was 10mg  daily except 7.5mg  on TTSat  INR subtherapeutic today at 1.57.  CBC low but stable and no bleeding noted at this time.   Goal of Therapy:  INR 2-3 Monitor platelets by anticoagulation protocol: Yes   Plan:  Warfarin 10mg  x 1 dose today Monitor daily INR, CBC, clinical course, s/sx of bleed, PO intake, DDI   Bertis Ruddy, PharmD Pharmacy Resident Pager #: [phone] 12/14/2017 8:02 AM

## 2017-12-14 NOTE — Progress Notes (Signed)
Occupational Therapy Session Note  Patient Details  Name: Thomas [name] MRN: [id number] Date of Birth: 23-May-1966  Today's Date: 12/14/2017 OT Individual Time: 1300-1415 OT Individual Time Calculation (min): 75 min    Short Term Goals: Week 1:  OT Short Term Goal 1 (Week 1): STG=LTg  Skilled Therapeutic Interventions/Progress Updates:    1:1. Pt with no c/o pain. Pt report urgency to void bowel. Pt squat pivot to [facility] with touching A and completes tricpe push up for OT to complete clothing management. Pt voids bowel seated on toilet, and completes hygiene. Pt transfers to w/c and EOM as stated above. Pt directed therapy with focus on BUE strength and sit to stand. Pt propels w/c to/from all tx destinations with supervision. Sitting EOM to improve sitting balance/BUE strength/endurance, Pt isometric hold of BUE at 80 degrees shoulder flexion while passing (chest, bounce, and overhead) basketball 3x30 with prolonged rest breaks in between sets. Pt sit to stand 4x in parallel bars with min A and VC for trunk flexion. Pt completes 8 min total (4 forward/4backward) of preferred arm bike exercise to improve BUE strength and endurance to improve BADL/functional mobility. Exited session with pt seated in w.c with call light in reach and all needs met.   Therapy Documentation Precautions:  Precautions Precautions: Fall Precaution Comments: wound vac RLE Required Braces or Orthoses: Other Brace/Splint Other Brace/Splint: LLE prothestic Restrictions Weight Bearing Restrictions: Yes RLE Weight Bearing: Non weight bearing LLE Weight Bearing: Weight bearing as tolerated  See Function Navigator for Current Functional Status.   Therapy/Group: Individual Therapy  [name] 12/14/2017, 1:57 PM

## 2017-12-14 NOTE — Plan of Care (Signed)
  Not Progressing RH SKIN INTEGRITY RH STG ABLE TO PERFORM INCISION/WOUND CARE W/ASSISTANCE Description STG Able To Perform Incision/Wound Care With mod Assistance.  Max/total assistance with dressing changes 12/14/2017 0311 - Not Progressing by Cornell Barman, RN

## 2017-12-14 NOTE — Progress Notes (Signed)
[place] PHYSICAL MEDICINE & REHABILITATION     PROGRESS NOTE  Subjective/Complaints:  No new complaints today.  Pain remains under reasonable control.  Slept well overnight  ROS: pt denies nausea, vomiting, diarrhea, cough, shortness of breath or chest pain   Objective: Vital Signs: Blood pressure 133/72, pulse 81, temperature 98.3 F (36.8 C), temperature source Oral, resp. rate 18, weight 120 kg (264 lb 8.8 oz), SpO2 98 %. No results found. Recent Labs    12/13/17 1349 12/14/17 0624  WBC 9.3 7.4  HGB 8.1* 8.3*  HCT 25.3* 26.9*  PLT 322 293   Recent Labs    12/11/17 1100 12/13/17 1423  NA 136 133*  K 3.4* 3.6  CL 95* 95*  GLUCOSE 177* 214*  BUN 49* 50*  CREATININE 9.66* 8.83*  CALCIUM 9.8 9.8   CBG (last 3)  Recent Labs    12/13/17 1212 12/13/17 2059 12/14/17 0659  GLUCAP 216* 261* 332*    Wt Readings from Last 3 Encounters:  12/14/17 120 kg (264 lb 8.8 oz)  12/11/17 116.7 kg (257 lb 4.4 oz)  12/05/17 129.3 kg (285 lb)    Physical Exam:  BP 133/72 (BP Location: Left Arm)   Pulse 81   Temp 98.3 F (36.8 C) (Oral)   Resp 18   Wt 120 kg (264 lb 8.8 oz)   SpO2 98%   BMI 30.57 kg/m  Constitutional: He appearswell-developed. NAD HENT: Normocephalic.  Atraumatic Eyes:EOMare normal. No discharge Cardiovascular:RRR without murmur. No JVD  Respiratory: CTA Bilaterally without wheezes or rales. Normal effort   GQ:QPYPP sounds are normal. He exhibitsno distension. Neurological: He isalertand oriented. Motor: 5/5 in BUE, 5/5 Left HF RLE: HF: 3+/5 (continued pain inhibition).   Skin: Right BKA dressed and covered with Ace wrap. old left BKA is well healed  Assessment/Plan: 1. 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 Physiatrist is providing close team supervision and 24 hour management of active medical problems  listed below. Physiatrist and rehab team continue to assess barriers to discharge/monitor patient progress toward functional and medical goals.  Function:  Bathing Bathing position   Position: Wheelchair/chair at sink  Bathing parts Body parts bathed by patient: Right arm, Left arm, Chest, Abdomen, Front perineal area, Right upper leg, Left upper leg Body parts bathed by helper: Buttocks  Bathing assist Assist Level: Supervision or verbal cues      Upper Body Dressing/Undressing Upper body dressing   What is the patient wearing?: Hospital gown, Orthosis             Orthosis activity level: Performed by helper  Upper body assist Assist Level: Touching or steadying assistance(Pt > 75%)      Lower Body Dressing/Undressing Lower body dressing   What is the patient wearing?: Hospital Gown                              Lower body assist        Toileting Toileting Toileting activity did not occur: No continent bowel/bladder event        Toileting assist     Transfers Chair/bed transfer   Chair/bed transfer method: Squat pivot Chair/bed transfer assist level: Touching or steadying assistance (Pt > 75%) Chair/bed transfer assistive device: Armrests     Locomotion Ambulation Ambulation activity did not occur: Safety/medical concerns(B BKA, poor standing  with L prosthesis)         Wheelchair   Type: Manual Max wheelchair distance: 250' Assist Level: Supervision or verbal cues  Cognition Comprehension Comprehension assist level: Follows complex conversation/direction with no assist  Expression Expression assist level: Expresses complex ideas: With extra time/assistive device  Social Interaction Social Interaction assist level: Interacts appropriately with others - No medications needed.  Problem Solving Problem solving assist level: Solves complex problems: Recognizes & self-corrects  Memory Memory assist level: Complete Independence: No helper     Medical Problem List and Plan: 1.Decreased functional mobilitysecondary to right BKA 12/01/2017 as well as history of left BKA 3 years ago.Patient does have a left prosthesis   Continue PT and OT 2. DVT Prophylaxis/Anticoagulation: Chronic Coumadin for history of SVT.Monitor for any bleeding episodes   INR subtherapeutic on 12/16 3. Pain Management:Oxycodone as needed 4. Mood:Provide emotional support 5. Neuropsych: This patientiscapable of making decisions on hisown behalf. 6. Skin/Wound Care:Routine skin checks 7. Fluids/Electrolytes/Nutrition:Routine I&O's  8.ESRD. Continue hemodialysis as per renal services 9.Acute on chronic anemia. Cont Aranesp   Hemoglobin 8.3 on 12/16 10.HIV.Continue current retroviral therapy 11.Diabetes mellitus with peripheralneuropathy. Hemoglobin A1c 7.7.   -Sugar remains poorly controlled.     CBG (last 3)  Recent Labs    12/13/17 1212 12/13/17 2059 12/14/17 0659  GLUCAP 216* 261* 332*     He might be someone who benefits from twice daily Lantus insulin to better control his p.m. numbers although am cbg's elevated also    -Increased Lantus tonight to 30 units on 12/15   -increase mealtime covg to 6u tid today   12.HTN/SVT.   Norvasc 5mg  daily, lisinopril 40 mg daily, Lopressor 25 mg twice a day.   Monitor with increased mobility 13.Congestive heart failure: Monitor for any signs of fluid overload.  Regular weights per nephrology    LOS (Days) 3 A FACE TO FACE EVALUATION WAS PERFORMED  Alesandra Smart T 12/14/2017 8:23 AM

## 2017-12-14 NOTE — Progress Notes (Signed)
Physical Therapy Session Note  Patient Details  Name: Thomas [name] MRN: [id number] Date of Birth: August 19, 1966  Today's Date: 12/14/2017 PT Individual Time: 1100-1200 PT Individual Time Calculation (min): 60 min   Short Term Goals: Week 1:  PT Short Term Goal 1 (Week 1): =LTG due to estimated LOS  Skilled Therapeutic Interventions/Progress Updates: Pt received seated in w/c, c/o pain 8/10 in RLE residual limb and agreeable to treatment. W/c propulsion to gym BUE with S and increased time for focus on strengthening and aerobic endurance. Sit <>stand in parallel bars x3 reps with min guard. Squat pivot transfer w/c >mat table with min guard. Sit <>stand from mat table elevated to 24" with minA and RW; LUE placed back on mat table, RUE on walker. Mat progressively lowered with one sit <>stand at each height (23">22">21">20") and two trials performed at 20" with modA. Sit >supine>prone with S. Prone lying x3 min; educated on importance of maintaining hip/knee ROM in RLE to prepare for prosthetic. Performed 3-way hip 2x10 reps each BLE with min cues for technique. Squat pivot to return to w/c with close S. W/c propulsion to return to room. Remained seated in w/c, all needs in reach at completion of session.      Therapy Documentation Precautions:  Precautions Precautions: Fall Precaution Comments: wound vac RLE Required Braces or Orthoses: Other Brace/Splint Other Brace/Splint: LLE prothestic Restrictions Weight Bearing Restrictions: Yes RLE Weight Bearing: Non weight bearing LLE Weight Bearing: Weight bearing as tolerated Pain: Pain Assessment Pain Assessment: 0-10 Pain Score: 8 Pain Type: Acute pain Pain Location: Leg Pain Orientation: Right Pain Descriptors / Indicators: Aching Pain Frequency: Constant Pain Onset: On-going Pain Intervention(s): Medication (See eMAR)  See Function Navigator for Current Functional Status.   Therapy/Group: Individual Therapy  Luberta Mutter 12/14/2017, 11:58 AM

## 2017-12-14 NOTE — Progress Notes (Signed)
Occupational Therapy Session Note  Patient Details  Name: Thomas [name] MRN: [id number] Date of Birth: November 03, 1966  Today's Date: 12/14/2017 OT Individual Time: 7076-1518 OT Individual Time Calculation (min): 57 min    Short Term Goals: Week 1:  OT Short Term Goal 1 (Week 1): STG=LTg  Skilled Therapeutic Interventions/Progress Updates:    1;1. Pt reporting 6/10 pain increasing to 7/10 pain towards end of session in distal RLE. Pt declines medication until end of session and RN alerted accordingly. Pt dons L prosthesis  EOB for improved sitting balance with supervision and transfers with min A squat pivot to L EOB>w/c with Vc for hand placement. Pt bathes at sink leaning laterally into recliner/standard chair to wash buttocks and advance pants past hips with VC for technique. Pt becomes frustrated saying "this is not realistic at home, when I could just have my wife pull my pants up for me." Discussed importance of having many strategies to complete ADLs to improve independence and decrease burden of care on wife. Also discussed using same strategy EOB and patient seemed more agreeable to that idea. Pt propels w/c to/from tx gym to engage in 1x15 BUE therex with 3# dumbbells and 5# dowel rod in all planes of motion. Exited session with pt seated n w/c with call light in reach and all needs met.   Therapy Documentation Precautions:  Precautions Precautions: Fall Precaution Comments: wound vac RLE Other Brace/Splint: LLE prothestic Restrictions Weight Bearing Restrictions: Yes RLE Weight Bearing: Non weight bearing LLE Weight Bearing: Non weight bearing General:    See Function Navigator for Current Functional Status.   Therapy/Group: Individual Therapy  [name] 12/14/2017, 9:08 AM

## 2017-12-15 ENCOUNTER — Inpatient Hospital Stay (HOSPITAL_COMMUNITY): Payer: Medicare Other

## 2017-12-15 ENCOUNTER — Inpatient Hospital Stay (HOSPITAL_COMMUNITY): Payer: Medicare Other | Admitting: Physical Therapy

## 2017-12-15 ENCOUNTER — Ambulatory Visit: Payer: Medicare Other | Admitting: Physician Assistant

## 2017-12-15 ENCOUNTER — Inpatient Hospital Stay (HOSPITAL_COMMUNITY): Payer: Medicare Other | Admitting: Occupational Therapy

## 2017-12-15 DIAGNOSIS — I10 Essential (primary) hypertension: Secondary | ICD-10-CM

## 2017-12-15 DIAGNOSIS — R195 Other fecal abnormalities: Secondary | ICD-10-CM

## 2017-12-15 LAB — GLUCOSE, CAPILLARY
GLUCOSE-CAPILLARY: 135 mg/dL — AB (ref 65–99)
GLUCOSE-CAPILLARY: 137 mg/dL — AB (ref 65–99)
GLUCOSE-CAPILLARY: 189 mg/dL — AB (ref 65–99)
Glucose-Capillary: 150 mg/dL — ABNORMAL HIGH (ref 65–99)

## 2017-12-15 LAB — PROTIME-INR
INR: 1.67
PROTHROMBIN TIME: 19.6 s — AB (ref 11.4–15.2)

## 2017-12-15 LAB — CBC
HEMATOCRIT: 26.6 % — AB (ref 39.0–52.0)
HEMOGLOBIN: 8.4 g/dL — AB (ref 13.0–17.0)
MCH: 28.6 pg (ref 26.0–34.0)
MCHC: 31.6 g/dL (ref 30.0–36.0)
MCV: 90.5 fL (ref 78.0–100.0)
Platelets: 292 10*3/uL (ref 150–400)
RBC: 2.94 MIL/uL — AB (ref 4.22–5.81)
RDW: 19.2 % — ABNORMAL HIGH (ref 11.5–15.5)
WBC: 9.3 10*3/uL (ref 4.0–10.5)

## 2017-12-15 MED ORDER — CALCIUM POLYCARBOPHIL 625 MG PO TABS
625.0000 mg | ORAL_TABLET | Freq: Every day | ORAL | Status: DC
  Administered 2017-12-15 – 2017-12-20 (×6): 625 mg via ORAL
  Filled 2017-12-15 (×6): qty 1

## 2017-12-15 MED ORDER — WARFARIN SODIUM 5 MG PO TABS
10.0000 mg | ORAL_TABLET | Freq: Once | ORAL | Status: AC
  Administered 2017-12-15: 10 mg via ORAL
  Filled 2017-12-15: qty 2

## 2017-12-15 NOTE — Progress Notes (Signed)
ANTICOAGULATION CONSULT NOTE - Follow-Up  Pharmacy Consult for warfarin Indication: atrial fibrillation  No Known Allergies  Vital Signs: Temp: 98.4 F (36.9 C) (12/17 0533) Temp Source: Oral (12/17 0533) BP: 123/79 (12/17 0533) Pulse Rate: 85 (12/17 0533)  Labs: Recent Labs    12/13/17 0540  12/13/17 1349 12/13/17 1423 12/14/17 0624 12/15/17 0534  HGB  --    < > 8.1*  --  8.3* 8.4*  HCT  --   --  25.3*  --  26.9* 26.6*  PLT  --   --  322  --  293 292  LABPROT 18.6*  --   --   --  18.7* 19.6*  INR 1.56  --   --   --  1.57 1.67  CREATININE  --   --   --  8.83*  --   --    < > = values in this interval not displayed.    Estimated Creatinine Clearance: 14.4 mL/min (A) (by C-G formula based on SCr of 8.83 mg/dL (H)).   Assessment: 89 YOM with AFib who was admitted with a supratherapeutic INR. INR was 3.46 on 12/7- patient received vitamin K 2.5mg  PO to reverse INR to <2 for surgery on 12/8. Warfarin was restarted on 12/9, then held 12/10 d/t CBC drop and restarted on the 11th. The patient was re-educated on warfarin on 12/10/17. PTA Home dose of Warfarin was 10mg  daily except 7.5mg  on TTSat  INR subtherapeutic today at 1.67, trending up slowly. CBC low but stable and no bleeding noted at this time.   Goal of Therapy:  INR 2-3 Monitor platelets by anticoagulation protocol: Yes   Plan:  Warfarin 10mg  x 1 dose today Monitor daily INR, CBC, clinical course, s/sx of bleed, PO intake, DDI   Maryanna Shape, PharmD, BCPS  Clinical Pharmacist  Pager: [phone]   12/15/2017 11:26 AM

## 2017-12-15 NOTE — Progress Notes (Signed)
Occupational Therapy Session Note  Patient Details  Name: Thomas [name] MRN: [id number] Date of Birth: 1966/05/16  Today's Date: 12/15/2017 OT Individual Time: 0932-6712 OT Individual Time Calculation (min): 60 min    Skilled Therapeutic Interventions/Progress Updates: Patient stated he was too fatigued to work on functional transfer or sit to stand, but he concerned to work on endurance traininging and strengthening w/c level to help increase his function for those tasks and for self care tasks.     He completed w/c push up, tri cep work (4 pounds each arm), and overall endurance.   At the end of the session, he was left seated in his wc with call bell and phone within reach.     Therapy Documentation Precautions:  Precautions Precautions: Fall Precaution Comments: wound vac RLE Required Braces or Orthoses: Other Brace/Splint Other Brace/Splint: LLE prothestic Restrictions Weight Bearing Restrictions: Yes RLE Weight Bearing: Non weight bearing LLE Weight Bearing: Weight bearing as tolerated Pain:denied    Therapy/Group: Individual Therapy  [facility] 12/15/2017, 2:57 PM

## 2017-12-15 NOTE — Progress Notes (Signed)
Occupational Therapy Session Note  Patient Details  Name: Thomas [name] MRN: [id number] Date of Birth: Jan 24, 1966  Today's Date: 12/15/2017 OT Individual Time: 2229-7989 OT Individual Time Calculation (min): 48 min    Short Term Goals: Week 1:  OT Short Term Goal 1 (Week 1): STG=LTg  Skilled Therapeutic Interventions/Progress Updates:    Pt presents seated in w/c with no c/o pain, ready for OT tx session. Transported Pt via w/c for energy conservation/time management to dayroom where Pt engaged in arm bike on level 5 for 39min; alternating between propelling forward/backward. Transported Pt to therapy gym, Pt completing squat pivot w/c<>mattable with close minguard. Seated on mat table Pt engages in bil UE stretching and exercise using 5lb dowel rod with additional weight added for increased challenge.Pt completes exercises across all UE planes for 15-20 reps each and incorporating lateral leans and trunk flexion throughout. Pt self propels w/c back to room, Pt left sated in w/c, needs within reach.   Therapy Documentation Precautions:  Precautions Precautions: Fall Precaution Comments: wound vac RLE Required Braces or Orthoses: Other Brace/Splint Other Brace/Splint: LLE prothestic Restrictions Weight Bearing Restrictions: Yes RLE Weight Bearing: Non weight bearing LLE Weight Bearing: Weight bearing as tolerated    See Function Navigator for Current Functional Status.   Therapy/Group: Individual Therapy  Raymondo Band 12/15/2017, 4:30 PM

## 2017-12-15 NOTE — Progress Notes (Signed)
[facility]ILITATION     PROGRESS NOTE  Subjective/Complaints:  Pt seen sitting up in bed this AM, working with therapies.   He slept well overnight.  He notes some improvement in bowel movements after meals.    ROS: Denies nausea, vomiting, diarrhea, shortness of breath or chest pain   Objective: Vital Signs: Blood pressure 123/79, pulse 85, temperature 98.4 F (36.9 C), temperature source Oral, resp. rate 18, weight 120 kg (264 lb 8.8 oz), SpO2 95 %. No results found. Recent Labs    12/14/17 0624 12/15/17 0534  WBC 7.4 9.3  HGB 8.3* 8.4*  HCT 26.9* 26.6*  PLT 293 292   Recent Labs    12/13/17 1423  NA 133*  K 3.6  CL 95*  GLUCOSE 214*  BUN 50*  CREATININE 8.83*  CALCIUM 9.8   CBG (last 3)  Recent Labs    12/14/17 1634 12/14/17 2027 12/15/17 0658  GLUCAP 165* 147* 135*    Wt Readings from Last 3 Encounters:  12/14/17 120 kg (264 lb 8.8 oz)  12/11/17 116.7 kg (257 lb 4.4 oz)  12/05/17 129.3 kg (285 lb)    Physical Exam:  BP 123/79 (BP Location: Left Arm)   Pulse 85   Temp 98.4 F (36.9 C) (Oral)   Resp 18   Wt 120 kg (264 lb 8.8 oz)   SpO2 95%   BMI 30.57 kg/m  Constitutional: He appearswell-developed. NAD HENT: Normocephalic.  Atraumatic Eyes:EOMare normal. No discharge Cardiovascular:RRR. No JVD  Respiratory: CTA Bilaterally. Normal effort   SE:GBTDV sounds are normal. He exhibitsno distension. Neurological: He isalertand oriented. Motor: 5/5 in BUE, 5/5 Left HF RLE: HF: 4/5 (pain inhibition).   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  Assessment/Plan: 1. 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 Physiatrist is providing close team supervision and 24 hour management of active medical problems listed below. Physiatrist and rehab team  continue to assess barriers to discharge/monitor patient progress toward functional and medical goals.  Function:  Bathing Bathing position   Position: Wheelchair/chair at sink  Bathing parts Body parts bathed by patient: Right arm, Left arm, Chest, Abdomen, Front perineal area, Right upper leg, Left upper leg Body parts bathed by helper: Buttocks  Bathing assist Assist Level: Supervision or verbal cues      Upper Body Dressing/Undressing Upper body dressing   What is the patient wearing?: Hospital gown, Orthosis             Orthosis activity level: Performed by helper  Upper body assist Assist Level: Touching or steadying assistance(Pt > 75%)      Lower Body Dressing/Undressing Lower body dressing   What is the patient wearing?: Hospital Gown                              Lower body assist        Toileting Toileting Toileting activity did not occur: (P) No continent bowel/bladder event        Toileting assist     Transfers Chair/bed transfer   Chair/bed transfer method: Squat pivot Chair/bed transfer assist level: Supervision or verbal cues Chair/bed transfer assistive device: Armrests     Locomotion Ambulation Ambulation activity did not occur: Safety/medical concerns(B BKA, poor standing with L prosthesis)  Wheelchair   Type: Manual Max wheelchair distance: 150 Assist Level: Supervision or verbal cues  Cognition Comprehension Comprehension assist level: Follows complex conversation/direction with no assist  Expression Expression assist level: Expresses complex ideas: With extra time/assistive device  Social Interaction Social Interaction assist level: Interacts appropriately with others - No medications needed.  Problem Solving Problem solving assist level: Solves complex problems: Recognizes & self-corrects  Memory Memory assist level: Complete Independence: No helper    Medical Problem List and Plan: 1.Decreased functional  mobilitysecondary to right BKA 12/01/2017 as well as history of left BKA 3 years ago.Patient does have a left prosthesis   Continue CIR 2. DVT Prophylaxis/Anticoagulation: Chronic Coumadin for history of SVT.Monitor for any bleeding episodes   INR subtherapeutic on 12/17 3. Pain Management:Oxycodone as needed 4. Mood:Provide emotional support 5. Neuropsych: This patientiscapable of making decisions on hisown behalf. 6. Skin/Wound Care:Routine skin checks 7. Fluids/Electrolytes/Nutrition:Routine I&O's  8.ESRD. Continue hemodialysis as per renal services 9.Acute on chronic anemia. Cont Aranesp   Hemoglobin 8.4 on 12/17 10.HIV.Continue current retroviral therapy 11.Diabetes mellitus with peripheralneuropathy. Hemoglobin A1c 7.7.    CBG (last 3)  Recent Labs    12/14/17 1634 12/14/17 2027 12/15/17 0658  GLUCAP 165* 147* 135*     Increased Lantus 30 units on 12/15   Increase mealtime covg to 6u tid 12/16   Improving  12.HTN/SVT.   Norvasc 5mg  daily, lisinopril 40 mg daily, Lopressor 25 mg twice a day.   Controlled on 12/17 13.Congestive heart failure: Monitor for any signs of fluid overload.  Regular weights per nephrology Filed Weights   12/13/17 1400 12/13/17 1710 12/14/17 0506  Weight: 122.3 kg (269 lb 10 oz) 121.3 kg (267 lb 6.7 oz) 120 kg (264 lb 8.8 oz)  14. Loose stools  Fibercon started on 12/17  LOS (Days) 4 A FACE TO FACE EVALUATION WAS PERFORMED  Ankit [name] 12/15/2017 9:48 AM

## 2017-12-15 NOTE — Progress Notes (Signed)
Physical Therapy Session Note  Patient Details  Name: Thomas [name] MRN: [id number] Date of Birth: January 05, 1966  Today's Date: 12/15/2017 PT Individual Time: 1000-1055 PT Individual Time Calculation (min): 55 min   Short Term Goals: Week 1:  PT Short Term Goal 1 (Week 1): =LTG due to estimated LOS  Skilled Therapeutic Interventions/Progress Updates:    Pt seated in w/c in room, agreeable to participate in therapy session. Pt reports 7/10 pain in R LE in standing but no pain at rest. Attempt sit to stand to RW, pt becomes anxious and is unable to perform transfer. Sit to stand in // bars with Min-Mod Assist, focus on UE placement during transfer and weight shift forwards, x 10 reps. Squat pivot transfer bed to/from mat with SBA, v/c for safety. Prone hip flexor stretch x 10 min with education for limb loss positioning. Prone R LE therex x 10 reps for strengthening. Pt engages in motivational peer conversation with fellow amputee and is able to provide insight into limb loss recovery process as well as prosthetic training. Pt demos good insight into recovery process and is able to provide good emotional support to fellow amputee. Manual w/c propulsion 2 x 150 ft, focus on setting w/c up to safely perform transfers. Pt left seated in w/c in room with needs in reach.  Therapy Documentation Precautions:  Precautions Precautions: Fall Precaution Comments: wound vac RLE Required Braces or Orthoses: Other Brace/Splint Other Brace/Splint: LLE prothestic Restrictions Weight Bearing Restrictions: Yes RLE Weight Bearing: Non weight bearing LLE Weight Bearing: Weight bearing as tolerated  See Function Navigator for Current Functional Status.   Therapy/Group: Individual Therapy  Excell Seltzer, PT, DPT  12/15/2017, 12:13 PM

## 2017-12-15 NOTE — Progress Notes (Signed)
Pt refused changing of stump dressing this am. Pt states that MD told him it would not need to be changed until tomorrow. Dressing appears intact and no drainage is present on ace bandage.

## 2017-12-15 NOTE — Progress Notes (Signed)
[facility] ROUNDING NOTE   Subjective:   ESRD TTS dialysis  Diabetes and HIV  S/p BKA 12/3  and currently on rehab  Has also atrial fibrillation and is was being anticoagulated with supratherapeutic INR      Interval History: TTS [facility] right AVF 4h 67min 3k/2.25 bath [id number] Hep none -mircera 225 q 2wks- last got 150 on 11/29 -hect 5 ug tiw -venofer 100 q wk    Objective:  Vital signs in last 24 hours:  Temp:  [98.1 F (36.7 C)-98.4 F (36.9 C)] 98.4 F (36.9 C) (12/17 0533) Pulse Rate:  [85-87] 85 (12/17 0533) Resp:  [18] 18 (12/17 0533) BP: (122-123)/(69-79) 123/79 (12/17 0533) SpO2:  [93 %-95 %] 95 % (12/17 0533)  Weight change:  Filed Weights   12/13/17 1400 12/13/17 1710 12/14/17 0506  Weight: 269 lb 10 oz (122.3 kg) 267 lb 6.7 oz (121.3 kg) 264 lb 8.8 oz (120 kg)    Intake/Output: I/O last 3 completed shifts: In: 580 [P.O.:580] Out: -    Intake/Output this shift:  No intake/output data recorded.  General:NAD, WDWN, sitting in wheelchair  Heart:RRR, no murmur rub or gallop Lungs:CTAB, no wheeze, rales or rhonchi Abdomen:soft, NT, ND Extremities: b/l BKA, left prosthesis in place, R in sock, no significant edema  Dialysis Access: right AVF + bruit     Basic Metabolic Panel: Recent Labs  Lab 12/09/17 0700 12/11/17 1100 12/13/17 1423  NA 134* 136 133*  K 4.4 3.4* 3.6  CL 96* 95* 95*  CO2 22 25 25   GLUCOSE 233* 177* 214*  BUN 52* 49* 50*  CREATININE 11.41* 9.66* 8.83*  CALCIUM 8.8* 9.8 9.8  PHOS 6.8* 7.9* 7.9*    Liver Function Tests: Recent Labs  Lab 12/09/17 0700 12/11/17 1100 12/13/17 1423  ALBUMIN 2.1* 2.2* 2.3*   No results for input(s): LIPASE, AMYLASE in the last 168 hours. No results for input(s): AMMONIA in the last 168 hours.  CBC: Recent Labs  Lab 12/10/17 0644 12/11/17 0853 12/13/17 1349 12/14/17 0624 12/15/17 0534  WBC 10.5 9.9 9.3 7.4 9.3  HGB 8.4* 8.3* 8.1* 8.3* 8.4*  HCT 27.5* 26.5* 25.3*  26.9* 26.6*  MCV 91.4 89.8 88.8 90.6 90.5  PLT 314 362 322 293 292    Cardiac Enzymes: No results for input(s): CKTOTAL, CKMB, CKMBINDEX, TROPONINI in the last 168 hours.  BNP: Invalid input(s): POCBNP  CBG: Recent Labs  Lab 12/14/17 0659 12/14/17 1156 12/14/17 1634 12/14/17 2027 12/15/17 0658  GLUCAP 332* 89 165* 147* 135*    Microbiology: Results for orders placed or performed during the hospital encounter of 12/05/17  Culture, blood (Routine X 2) w Reflex to ID Panel     Status: None   Collection Time: 12/05/17 11:40 AM  Result Value Ref Range Status   Specimen Description BLOOD LEFT FOREARM  Final   Special Requests   Final    BOTTLES DRAWN AEROBIC AND ANAEROBIC Blood Culture adequate volume   Culture NO GROWTH 5 DAYS  Final   Report Status 12/10/2017 FINAL  Final  Culture, blood (Routine X 2) w Reflex to ID Panel     Status: None   Collection Time: 12/05/17  1:13 PM  Result Value Ref Range Status   Specimen Description BLOOD LEFT FOREARM  Final   Special Requests   Final    BOTTLES DRAWN AEROBIC AND ANAEROBIC Blood Culture adequate volume   Culture NO GROWTH 5 DAYS  Final   Report Status 12/10/2017 FINAL  Final  Surgical pcr screen     Status: None   Collection Time: 12/05/17 10:29 PM  Result Value Ref Range Status   MRSA, PCR NEGATIVE NEGATIVE Final   Staphylococcus aureus NEGATIVE NEGATIVE Final    Comment: (NOTE) The Xpert SA Assay (FDA approved for NASAL specimens in patients 47 years of age and older), is one component of a comprehensive surveillance program. It is not intended to diagnose infection nor to guide or monitor treatment.     Coagulation Studies: Recent Labs    12/13/17 0540 12/14/17 0624 12/15/17 0534  LABPROT 18.6* 18.7* 19.6*  INR 1.56 1.57 1.67    Urinalysis: No results for input(s): COLORURINE, LABSPEC, PHURINE, GLUCOSEU, HGBUR, BILIRUBINUR, KETONESUR, PROTEINUR, UROBILINOGEN, NITRITE, LEUKOCYTESUR in the last 72  hours.  Invalid input(s): APPERANCEUR    Imaging: No results found.   Medications:   . [START ON 12/18/2017] ferric gluconate (FERRLECIT/NULECIT) IV     . amLODipine  5 mg Oral QPM  . darbepoetin (ARANESP) injection - DIALYSIS  200 mcg Intravenous Q Sat-HD  . doxercalciferol  5 mcg Intravenous Q T,Th,Sa-HD  . feeding supplement (NEPRO CARB STEADY)  237 mL Oral BID BM  . feeding supplement (PRO-STAT SUGAR FREE 64)  30 mL Oral BID  . fosamprenavir  700 mg Oral BID  . insulin aspart  0-20 Units Subcutaneous TID WC  . insulin aspart  6 Units Subcutaneous TID WC  . insulin glargine  30 Units Subcutaneous QHS  . lipase/protease/amylase  12,000 Units Oral TID AC  . lisinopril  40 mg Oral Daily  . metoprolol tartrate  25 mg Oral BID  . multivitamin  1 tablet Oral QHS  . polycarbophil  625 mg Oral Daily  . raltegravir  400 mg Oral BID  . ritonavir  100 mg Oral Q breakfast  . Warfarin - Pharmacist Dosing Inpatient   Does not apply q1800   acetaminophen **OR** acetaminophen, camphor-menthol, lip balm, ondansetron **OR** ondansetron (ZOFRAN) IV, oxyCODONE, sorbitol  Assessment/ Plan:  1. S/p R BKA - now transferred to rehab. 2. ESRD - TTS, no heparin  3. Anemia of CKD- Hgb 8.3, s/p 2 Units pRBC, 1 unit on 12/10 & 1 unit on 12/11, on 289mcg aranesp starting 12/15 and restarting weekly IV iron.  4. Secondary hyperparathyroidism - Ca 9.8, on hectorol, P 7.9,   5. HTN/volume - Volume appears ok, will need new EDW at d/c. Post wt yesterday 116.7kg. BP well controlled on current meds.  Will remove volume as tolerated.  6. Nutrition - Alb 2.2, renal/carb modified diet. Nepro TID.  7. SVT vs A flutter - on coumadin - per primary 8. HIV - per primary 9. DM - per primary         LOS: 4 Jacqueline Spofford W @TODAY @10 :45 AM

## 2017-12-15 NOTE — Progress Notes (Signed)
Physical Therapy Session Note  Patient Details  Name: Thomas [name] MRN: [id number] Date of Birth: 1966-07-16  Today's Date: 12/15/2017 PT Individual Time: 0802-0900 PT Individual Time Calculation (min): 58 min   Short Term Goals: Week 1:  PT Short Term Goal 1 (Week 1): =LTG due to estimated LOS  Skilled Therapeutic Interventions/Progress Updates:    Pt supine in bed upon PT arrival, agreeable to therapy tx and denies pain. Pt transferred supine>sitting EOB with supervision. Pt donned shorts and L LE prosthesis working on seated balance. Pt transferred from bed>w/c squat pivot with supervision. Pt worked on dynamic seated balance in order to wash face/body at the sink and to brush teeth. Pt propelled w/c from room>gym with supervision. Pt transferred w/c<>mat squat pivot with supervision. Pt performed LE therex: 2 x 10 SLR in supine, 2 x 10 hip abduction and 2 x 10 hip extension in sidelying. Pt performed x 2 sit<>stands using RW from elevated mat with mod assist. Pt transferred back to w/c and propelled back to room. Left seated in w/c with needs in reach.   Therapy Documentation Precautions:  Precautions Precautions: Fall Precaution Comments: wound vac RLE Required Braces or Orthoses: Other Brace/Splint Other Brace/Splint: LLE prothestic Restrictions Weight Bearing Restrictions: Yes RLE Weight Bearing: Non weight bearing LLE Weight Bearing: Weight bearing as tolerated   See Function Navigator for Current Functional Status.   Therapy/Group: Individual Therapy  Netta Corrigan, PT, DPT 12/15/2017, 7:59 AM

## 2017-12-16 ENCOUNTER — Encounter: Payer: Self-pay | Admitting: Physician Assistant

## 2017-12-16 ENCOUNTER — Ambulatory Visit: Payer: Medicare Other | Admitting: Physician Assistant

## 2017-12-16 ENCOUNTER — Inpatient Hospital Stay (HOSPITAL_COMMUNITY): Payer: Medicare Other | Admitting: Occupational Therapy

## 2017-12-16 ENCOUNTER — Inpatient Hospital Stay (HOSPITAL_COMMUNITY): Payer: Medicare Other | Admitting: Physical Therapy

## 2017-12-16 ENCOUNTER — Inpatient Hospital Stay (HOSPITAL_COMMUNITY): Payer: Medicare Other

## 2017-12-16 DIAGNOSIS — N186 End stage renal disease: Secondary | ICD-10-CM

## 2017-12-16 DIAGNOSIS — Z992 Dependence on renal dialysis: Secondary | ICD-10-CM

## 2017-12-16 LAB — CBC
HCT: 25.1 % — ABNORMAL LOW (ref 39.0–52.0)
HEMATOCRIT: 24.4 % — AB (ref 39.0–52.0)
HEMOGLOBIN: 7.8 g/dL — AB (ref 13.0–17.0)
Hemoglobin: 7.9 g/dL — ABNORMAL LOW (ref 13.0–17.0)
MCH: 28 pg (ref 26.0–34.0)
MCH: 28.8 pg (ref 26.0–34.0)
MCHC: 31.1 g/dL (ref 30.0–36.0)
MCHC: 32.4 g/dL (ref 30.0–36.0)
MCV: 90 fL (ref 78.0–100.0)
MCV: 89.1 fL (ref 78.0–100.0)
PLATELETS: 286 10*3/uL (ref 150–400)
PLATELETS: 314 10*3/uL (ref 150–400)
RBC: 2.79 MIL/uL — ABNORMAL LOW (ref 4.22–5.81)
RBC: 2.74 MIL/uL — ABNORMAL LOW (ref 4.22–5.81)
RDW: 19.2 % — ABNORMAL HIGH (ref 11.5–15.5)
RDW: 19.6 % — AB (ref 11.5–15.5)
WBC: 9 10*3/uL (ref 4.0–10.5)
WBC: 9 10*3/uL (ref 4.0–10.5)

## 2017-12-16 LAB — RENAL FUNCTION PANEL
Albumin: 2.6 g/dL — ABNORMAL LOW (ref 3.5–5.0)
Anion gap: 17 — ABNORMAL HIGH (ref 5–15)
BUN: 81 mg/dL — AB (ref 6–20)
CHLORIDE: 95 mmol/L — AB (ref 101–111)
CO2: 24 mmol/L (ref 22–32)
CREATININE: 12.41 mg/dL — AB (ref 0.61–1.24)
Calcium: 9.9 mg/dL (ref 8.9–10.3)
GFR calc Af Amer: 5 mL/min — ABNORMAL LOW (ref >60)
GFR calc non Af Amer: 4 mL/min — ABNORMAL LOW (ref >60)
GLUCOSE: 145 mg/dL — AB (ref 65–99)
POTASSIUM: 3.5 mmol/L (ref 3.5–5.1)
Phosphorus: 10.6 mg/dL — ABNORMAL HIGH (ref 2.5–4.6)
Sodium: 136 mmol/L (ref 135–145)

## 2017-12-16 LAB — GLUCOSE, CAPILLARY
GLUCOSE-CAPILLARY: 106 mg/dL — AB (ref 65–99)
GLUCOSE-CAPILLARY: 117 mg/dL — AB (ref 65–99)
Glucose-Capillary: 100 mg/dL — ABNORMAL HIGH (ref 65–99)
Glucose-Capillary: 98 mg/dL (ref 65–99)

## 2017-12-16 LAB — PROTIME-INR
INR: 1.98
Prothrombin Time: 22.3 seconds — ABNORMAL HIGH (ref 11.4–15.2)

## 2017-12-16 MED ORDER — LIDOCAINE-PRILOCAINE 2.5-2.5 % EX CREA
1.0000 "application " | TOPICAL_CREAM | CUTANEOUS | Status: DC | PRN
  Filled 2017-12-16: qty 5

## 2017-12-16 MED ORDER — WARFARIN SODIUM 7.5 MG PO TABS
7.5000 mg | ORAL_TABLET | Freq: Once | ORAL | Status: AC
  Administered 2017-12-16: 7.5 mg via ORAL
  Filled 2017-12-16: qty 1

## 2017-12-16 MED ORDER — PENTAFLUOROPROP-TETRAFLUOROETH EX AERO: 1.0000 "application " | INHALATION_SPRAY | CUTANEOUS | Status: DC | PRN

## 2017-12-16 MED ORDER — HEPARIN SODIUM (PORCINE) 1000 UNIT/ML DIALYSIS
1000.0000 [IU] | INTRAMUSCULAR | Status: DC | PRN
  Filled 2017-12-16: qty 1

## 2017-12-16 MED ORDER — DOXERCALCIFEROL 4 MCG/2ML IV SOLN
INTRAVENOUS | Status: AC
  Administered 2017-12-16: 5 ug via INTRAVENOUS
  Filled 2017-12-16: qty 4

## 2017-12-16 MED ORDER — LIDOCAINE HCL (PF) 1 % IJ SOLN: 5.0000 mL | INTRAMUSCULAR | Status: DC | PRN

## 2017-12-16 MED ORDER — SODIUM CHLORIDE 0.9 % IV SOLN: 100.0000 mL | INTRAVENOUS | Status: DC | PRN

## 2017-12-16 MED ORDER — ALTEPLASE 2 MG IJ SOLR
2.0000 mg | Freq: Once | INTRAMUSCULAR | Status: DC | PRN
  Filled 2017-12-16: qty 2

## 2017-12-16 NOTE — Progress Notes (Signed)
Occupational Therapy Session Note  Patient Details  Name: Thomas [name] MRN: [id number] Date of Birth: [birth year]  Today's Date: 12/16/2017 OT Individual Time: 0962-8366 OT Individual Time Calculation (min): 56 min    Short Term Goals: Week 1:  OT Short Term Goal 1 (Week 1): STG=LTg  Skilled Therapeutic Interventions/Progress Updates:    Pt completed toilet transfer to and from the drop arm commode with min guard assist during session.  He was able to state that he has a toilet similar to the one in his room at home.  Discussed the fact that he will not be able to get his wheelchair in his bathroom at this time so will need to place the 3:1 out next to the bed or in the bedroom for use.  Next had him propel his wheelchair down to the therapy gym where he transferred to the therapy mat at the same min guard assist level.  He worked on Airline pilot in sitting, with lateral leans side to side to bring theraband over his hips, all with supervision.  Finished session with work on BUE strengthening exercises with use of green medium resistance theraband.  He completed 1 set of shoulder horizontal abduction, shoulder flexion, shoulder row, and triceps extension for each UE.  Repetitions for at 10 for all exercises except triceps extension, which he completed 20 of.  Returned to room at end of session with pt left up in the wheelchair with call button and phone in reach.    Therapy Documentation Precautions:  Precautions Precautions: Fall Precaution Comments: wound vac RLE Required Braces or Orthoses: Other Brace/Splint Other Brace/Splint: LLE prothestic Restrictions Weight Bearing Restrictions: Yes RLE Weight Bearing: Non weight bearing LLE Weight Bearing: Weight bearing as tolerated  Pain: Pain Assessment Pain Assessment: 0-10 Pain Score: 6  Pain Type: Surgical pain Pain Location: Leg Pain Orientation: Right Pain Descriptors / Indicators: Discomfort Pain Intervention(s):  Repositioned ADL: See Function Navigator for Current Functional Status.   Therapy/Group: Individual Therapy  Jagger Beahm OTR/L 12/16/2017, 3:47 PM

## 2017-12-16 NOTE — Progress Notes (Signed)
Physical Therapy Session Note  Patient Details  Name: Thomas [name] MRN: [id number] Date of Birth: 1966/01/06  Today's Date: 12/16/2017 PT Individual Time: 1030-1150 PT Individual Time Calculation (min): 80 min   Short Term Goals: Week 1:  PT Short Term Goal 1 (Week 1): =LTG due to estimated LOS  Skilled Therapeutic Interventions/Progress Updates:   Pt in w/c upon arrival and agreeable to therapy, no c/o pain at rest. Pt self-propelled w/c to/from therapy gym, ~200' each way, w/ supervision using BUEs to work on endurance w/ functional mobility. Practiced uneven squat pivot transfers in gym from w/c<>mat in both L and R directions to simulate home environment. Pt has high bed height at home and needs to get in and out of truck as this is his only mode to get to/from dialysis upon discharge. Pt performed transfers towards R side w/ supervision and towards L side w/ min assist for safety 2/2 positioning of LLE prosthesis. Worked on global strengthening second half of session as listed below. Brief rests in between sets 2/2 fatigue, pt performed passive R knee flexion and extension stretching during this time. Performed UE ergometer 5 min @ L5 for warm-up and transferred to/from edge of mat for exercises and performed all sit<>supine and rolling mobility on mat w/ supervision. Returned to room in w/c and ended session in w/c, call bell within reach and all needs met. Pt w/ increased residual limb pain in dependent positioning throughout session, however resolves w/ elevation and extending knee.   Global strengthening exercises: -supine UE push ups 3x15 5# dowel rod -supine UE flexion 3x15 5# dowel rod  -supine tricep pulls 2x15 green TB -supine R hip flexion 2x15 -supine R SLR 2x15  -sidelying R hip abduction 2x20  -prone R hip extension 2x20   Therapy Documentation Precautions:  Precautions Precautions: Fall Precaution Comments: wound vac RLE Required Braces or Orthoses: Other  Brace/Splint Other Brace/Splint: LLE prothestic Restrictions Weight Bearing Restrictions: Yes RLE Weight Bearing: Non weight bearing LLE Weight Bearing: Weight bearing as tolerated  See Function Navigator for Current Functional Status.   Therapy/Group: Individual Therapy  Bryony Kaman K Arnette 12/16/2017, 11:52 AM

## 2017-12-16 NOTE — Progress Notes (Signed)
Physical Therapy Session Note  Patient Details  Name: Thomas [name] MRN: [id number] Date of Birth: [birth year]  Today's Date: 12/16/2017 PT Individual Time: 0802-0900 PT Individual Time Calculation (min): 58 min   Short Term Goals: Week 1:  PT Short Term Goal 1 (Week 1): =LTG due to estimated LOS  Skilled Therapeutic Interventions/Progress Updates:    Pt seated in w/c upon PT arrival, agreeable to therapy tx and reports pain 6/10. Pt upset that his limb was not re-wrapped directly after the doctor had taken it off. Therapist provided assist to apply dressing and wrap limb. Pt propelled w/c from room throughout unit with supervision, still requiring cues for proper transfer set up and w/c part management. Pt practiced unlevel surface transfers from w/c<>rehab apartment bed with supervision and verbal cues. Pt declined practicing recliner transfer and stated he will mainly stay in his w/c. Therapist discussed and demonstrated A-P transfer as an option to get OOB when he does not have his prosthesis on. Pt transferred w/c>bed squat pivot supervision and completed A-P transfer from mat>w/c with supervision and verbal cues for technique. Pt performed x 2 sit<>stands in parallel bars, unable to push through UEs to off weight prosthesis/LE. Pt propelled back to room and left seated in w/c, needs in reach.   Therapy Documentation Precautions:  Precautions Precautions: Fall Precaution Comments: wound vac RLE Required Braces or Orthoses: Other Brace/Splint Other Brace/Splint: LLE prothestic Restrictions Weight Bearing Restrictions: Yes RLE Weight Bearing: Non weight bearing LLE Weight Bearing: Weight bearing as tolerated   See Function Navigator for Current Functional Status.   Therapy/Group: Individual Therapy  Netta Corrigan, PT, DPT 12/16/2017, 7:47 AM

## 2017-12-16 NOTE — Progress Notes (Signed)
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  and currently on rehab  Has also atrial fibrillation and is was being anticoagulated with supratherapeutic INR     No complaints this morning   Interval History: TTS [facility] right AVF 4h 21min 3k/2.25 bath [id number] Hep none -mircera 225 q 2wks- last got 150 on 11/29 -hect 5 ug tiw -venofer 100 q wk    Objective:  Vital signs in last 24 hours:  Temp:  [97.6 F (36.4 C)-98.9 F (37.2 C)] 98.9 F (37.2 C) (12/18 0555) Pulse Rate:  [83-86] 86 (12/18 0555) Resp:  [16-18] 16 (12/18 0555) BP: (122-133)/(69-74) 133/74 (12/18 0555) SpO2:  [92 %-93 %] 93 % (12/18 0555)  Weight change:  Filed Weights   12/13/17 1400 12/13/17 1710 12/14/17 0506  Weight: 269 lb 10 oz (122.3 kg) 267 lb 6.7 oz (121.3 kg) 264 lb 8.8 oz (120 kg)    Intake/Output: I/O last 3 completed shifts: In: 15 [P.O.:720] Out: -    Intake/Output this shift:  Total I/O In: 240 [P.O.:240] Out: -   General:NAD, WDWN, sitting in wheelchair Heart:RRR, no murmur rub or gallop Lungs:CTAB, no wheeze, rales or rhonchi Abdomen:soft, NT, ND Extremities:b/l BKA, left prosthesis in place, R in sock, no significant edema Dialysis Access:right AVF + bruit    Basic Metabolic Panel: Recent Labs  Lab 12/11/17 1100 12/13/17 1423  NA 136 133*  K 3.4* 3.6  CL 95* 95*  CO2 25 25  GLUCOSE 177* 214*  BUN 49* 50*  CREATININE 9.66* 8.83*  CALCIUM 9.8 9.8  PHOS 7.9* 7.9*    Liver Function Tests: Recent Labs  Lab 12/11/17 1100 12/13/17 1423  ALBUMIN 2.2* 2.3*   No results for input(s): LIPASE, AMYLASE in the last 168 hours. No results for input(s): AMMONIA in the last 168 hours.  CBC: Recent Labs  Lab 12/11/17 0853 12/13/17 1349 12/14/17 0624 12/15/17 0534 12/16/17 0445  WBC 9.9 9.3 7.4 9.3 9.0  HGB 8.3* 8.1* 8.3* 8.4* 7.8*  HCT 26.5* 25.3* 26.9* 26.6* 25.1*  MCV 89.8 88.8 90.6 90.5  90.0  PLT 362 322 293 292 286    Cardiac Enzymes: No results for input(s): CKTOTAL, CKMB, CKMBINDEX, TROPONINI in the last 168 hours.  BNP: Invalid input(s): POCBNP  CBG: Recent Labs  Lab 12/15/17 0658 12/15/17 1148 12/15/17 1642 12/15/17 2110 12/16/17 0627  GLUCAP 135* 150* 137* 189* 106*    Microbiology: Results for orders placed or performed during the hospital encounter of 12/05/17  Culture, blood (Routine X 2) w Reflex to ID Panel     Status: None   Collection Time: 12/05/17 11:40 AM  Result Value Ref Range Status   Specimen Description BLOOD LEFT FOREARM  Final   Special Requests   Final    BOTTLES DRAWN AEROBIC AND ANAEROBIC Blood Culture adequate volume   Culture NO GROWTH 5 DAYS  Final   Report Status 12/10/2017 FINAL  Final  Culture, blood (Routine X 2) w Reflex to ID Panel     Status: None   Collection Time: 12/05/17  1:13 PM  Result Value Ref Range Status   Specimen Description BLOOD LEFT FOREARM  Final   Special Requests   Final    BOTTLES DRAWN AEROBIC AND ANAEROBIC Blood Culture adequate volume   Culture NO GROWTH 5 DAYS  Final   Report Status 12/10/2017 FINAL  Final  Surgical pcr screen     Status: None  Collection Time: 12/05/17 10:29 PM  Result Value Ref Range Status   MRSA, PCR NEGATIVE NEGATIVE Final   Staphylococcus aureus NEGATIVE NEGATIVE Final    Comment: (NOTE) The Xpert SA Assay (FDA approved for NASAL specimens in patients 30 years of age and older), is one component of a comprehensive surveillance program. It is not intended to diagnose infection nor to guide or monitor treatment.     Coagulation Studies: Recent Labs    12/14/17 0624 12/15/17 0534 12/16/17 0445  LABPROT 18.7* 19.6* 22.3*  INR 1.57 1.67 1.98    Urinalysis: No results for input(s): COLORURINE, LABSPEC, PHURINE, GLUCOSEU, HGBUR, BILIRUBINUR, KETONESUR, PROTEINUR, UROBILINOGEN, NITRITE, LEUKOCYTESUR in the last 72 hours.  Invalid input(s): APPERANCEUR     Imaging: No results found.   Medications:   . [START ON 12/18/2017] ferric gluconate (FERRLECIT/NULECIT) IV     . amLODipine  5 mg Oral QPM  . darbepoetin (ARANESP) injection - DIALYSIS  200 mcg Intravenous Q Sat-HD  . doxercalciferol  5 mcg Intravenous Q T,Th,Sa-HD  . feeding supplement (NEPRO CARB STEADY)  237 mL Oral BID BM  . feeding supplement (PRO-STAT SUGAR FREE 64)  30 mL Oral BID  . fosamprenavir  700 mg Oral BID  . insulin aspart  0-20 Units Subcutaneous TID WC  . insulin aspart  6 Units Subcutaneous TID WC  . insulin glargine  30 Units Subcutaneous QHS  . lipase/protease/amylase  12,000 Units Oral TID AC  . lisinopril  40 mg Oral Daily  . metoprolol tartrate  25 mg Oral BID  . multivitamin  1 tablet Oral QHS  . polycarbophil  625 mg Oral Daily  . raltegravir  400 mg Oral BID  . ritonavir  100 mg Oral Q breakfast  . Warfarin - Pharmacist Dosing Inpatient   Does not apply q1800   acetaminophen **OR** acetaminophen, camphor-menthol, lip balm, ondansetron **OR** ondansetron (ZOFRAN) IV, oxyCODONE, sorbitol  Assessment/ Plan:  1.S/p R BKA - now transferred to rehab. 2. ESRD- TTS, no heparin  3. Anemiaof CKD-Hgb 8.3, s/p 2 Units pRBC, 1 unit on 12/10 & 1 unit on 12/11, on 271mcg aranesp starting 12/15 and restarting weekly IV iron.   Hb 7.8 4. Secondary hyperparathyroidism- Ca 9.8, on hectorol, P 7.9,   5.HTN/volume- Volume appears ok, will need new EDW at d/c. Post wt yesterday 116.7kg. BP well controlled on current meds. Will remove volume as tolerated.  6. Nutrition- Alb 2.2, renal/carb modified diet. Nepro TID.  7. SVT vs A flutter - on coumadin - per primary 8. HIV - per primary 9. DM - per primary     LOS: 5 Thomas [name] W @TODAY @11 :33 AM

## 2017-12-16 NOTE — Progress Notes (Signed)
HD tx initiated via 15Gx2 w/o problem, pull/push/flush equally w/o problem, VSS, will cont to monitor while on HD tx 

## 2017-12-16 NOTE — Progress Notes (Signed)
ANTICOAGULATION CONSULT NOTE - Follow-Up  Pharmacy Consult for warfarin Indication: atrial fibrillation  No Known Allergies  Vital Signs: Temp: 98.9 F (37.2 C) (12/18 0555) Temp Source: Oral (12/18 0555) BP: 133/74 (12/18 0555) Pulse Rate: 86 (12/18 0555)  Labs: Recent Labs    12/13/17 1423 12/14/17 0624 12/15/17 0534 12/16/17 0445  HGB  --  8.3* 8.4* 7.8*  HCT  --  26.9* 26.6* 25.1*  PLT  --  293 292 286  LABPROT  --  18.7* 19.6* 22.3*  INR  --  1.57 1.67 1.98  CREATININE 8.83*  --   --   --     Estimated Creatinine Clearance: 14.4 mL/min (A) (by C-G formula based on SCr of 8.83 mg/dL (H)).   Assessment: 66 YOM with AFib who was admitted with a supratherapeutic INR. INR was 3.46 on 12/7- patient received vitamin K 2.5mg  PO to reverse INR to <2 for surgery on 12/8. Warfarin was restarted on 12/9, then held 12/10 d/t CBC drop and restarted on the 11th. INR subtherapeutic today at 1.67 > 1.98, trending up. CBC low but stable and no bleeding noted at this time.   PTA Home dose of Warfarin was 10mg  daily except 7.5mg  on TTSat  Goal of Therapy:  INR 2-3 Monitor platelets by anticoagulation protocol: Yes   Plan:  Warfarin 7.5 mg x 1 dose today Monitor daily INR, CBC, clinical course, s/sx of bleed, PO intake, DDI   Maryanna Shape, PharmD, BCPS  Clinical Pharmacist  Pager: [phone]   12/16/2017 12:50 PM

## 2017-12-16 NOTE — Progress Notes (Signed)
PHYSICAL MEDICINE & REHABILITATION     PROGRESS NOTE  Subjective/Complaints:  Patient seen sitting up in his chair this morning. He states he slept well overnight. He is agitated and demanding. He is upset that I would like to take down his dressing and evaluate his stump. Stump issues discussed with nursing.  ROS: Denies nausea, vomiting, diarrhea, shortness of breath or chest pain   Objective: Vital Signs: Blood pressure 133/74, pulse 86, temperature 98.9 F (37.2 C), temperature source Oral, resp. rate 16, weight 120 kg (264 lb 8.8 oz), SpO2 93 %. No results found. Recent Labs    12/15/17 0534 12/16/17 0445  WBC 9.3 9.0  HGB 8.4* 7.8*  HCT 26.6* 25.1*  PLT 292 286   Recent Labs    12/13/17 1423  NA 133*  K 3.6  CL 95*  GLUCOSE 214*  BUN 50*  CREATININE 8.83*  CALCIUM 9.8   CBG (last 3)  Recent Labs    12/15/17 1642 12/15/17 2110 12/16/17 0627  GLUCAP 137* 189* 106*    Wt Readings from Last 3 Encounters:  12/14/17 120 kg (264 lb 8.8 oz)  12/11/17 116.7 kg (257 lb 4.4 oz)  12/05/17 129.3 kg (285 lb)    Physical Exam:  BP 133/74 (BP Location: Left Arm)   Pulse 86   Temp 98.9 F (37.2 C) (Oral)   Resp 16   Wt 120 kg (264 lb 8.8 oz)   SpO2 93%   BMI 30.57 kg/m  Constitutional: He appearswell-developed. NAD HENT: Normocephalic.  Atraumatic Eyes:EOMare normal. No discharge Cardiovascular:RRR. No JVD  Respiratory: CTA Bilaterally. Normal effort   UU:VOZDG sounds are normal. He exhibitsno distension. Neurological: He isalertand oriented. Motor: 5/5 in BUE, 5/5 Left HF RLE: HF: 4/5 (pain inhibition, stable).   Skin: Right BKA with open blister along gluteal posterior flap, no drainage Old left BKA is well healed Psych: Agitated.  Assessment/Plan: 1. 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 Physiatrist  is providing close team supervision and 24 hour management of active medical problems listed below. Physiatrist and rehab team continue to assess barriers to discharge/monitor patient progress toward functional and medical goals.  Function:  Bathing Bathing position   Position: Wheelchair/chair at sink  Bathing parts Body parts bathed by patient: Right arm, Left arm, Chest, Abdomen, Front perineal area, Right upper leg, Left upper leg Body parts bathed by helper: Buttocks  Bathing assist Assist Level: Supervision or verbal cues      Upper Body Dressing/Undressing Upper body dressing   What is the patient wearing?: Hospital gown, Orthosis             Orthosis activity level: Performed by helper  Upper body assist Assist Level: Touching or steadying assistance(Pt > 75%)      Lower Body Dressing/Undressing Lower body dressing   What is the patient wearing?: Hospital Gown                              Lower body assist        Toileting Toileting Toileting activity did not occur: No continent bowel/bladder event   Toileting steps completed by helper: Adjust clothing prior to toileting, Performs perineal hygiene, Adjust clothing after toileting(per Linden Dolin, NT)    Toileting assist     Transfers Chair/bed transfer   Chair/bed transfer method: Squat  pivot Chair/bed transfer assist level: Supervision or verbal cues Chair/bed transfer assistive device: Armrests     Locomotion Ambulation Ambulation activity did not occur: Safety/medical concerns(B BKA, poor standing with L prosthesis)         Wheelchair   Type: Manual Max wheelchair distance: 150 Assist Level: Supervision or verbal cues  Cognition Comprehension Comprehension assist level: Follows complex conversation/direction with no assist  Expression Expression assist level: Expresses complex ideas: With extra time/assistive device  Social Interaction Social Interaction assist level: Interacts  appropriately with others - No medications needed.  Problem Solving Problem solving assist level: Solves complex problems: Recognizes & self-corrects  Memory Memory assist level: Complete Independence: No helper    Medical Problem List and Plan: 1.Decreased functional mobilitysecondary to right BKA 12/01/2017 as well as history of left BKA 3 years ago.Patient does have a left prosthesis   Continue CIR 2. DVT Prophylaxis/Anticoagulation: Chronic Coumadin for history of SVT.Monitor for any bleeding episodes   INR subtherapeutic on 12/18 3. Pain Management:Oxycodone as needed 4. Mood:Provide emotional support 5. Neuropsych: This patientiscapable of making decisions on hisown behalf. 6. Skin/Wound Care:Routine skin checks 7. Fluids/Electrolytes/Nutrition:Routine I&O's  8.ESRD. Continue hemodialysis as per renal services 9.Acute on chronic anemia. Cont Aranesp   Hemoglobin 7.8 on 12/18 10.HIV.Continue current retroviral therapy 11.Diabetes mellitus with peripheralneuropathy. Hemoglobin A1c 7.7.    CBG (last 3)  Recent Labs    12/15/17 1642 12/15/17 2110 12/16/17 0627  GLUCAP 137* 189* 106*     Increased Lantus 30 units on 12/15   Increase mealtime covg to 6u tid 12/16   Improving control on 12/18  12.HTN/SVT.   Norvasc 5mg  daily, lisinopril 40 mg daily, Lopressor 25 mg twice a day.   Controlled on 12/18 13.Congestive heart failure: Monitor for any signs of fluid overload.  Regular weights per nephrology Filed Weights   12/13/17 1400 12/13/17 1710 12/14/17 0506  Weight: 122.3 kg (269 lb 10 oz) 121.3 kg (267 lb 6.7 oz) 120 kg (264 lb 8.8 oz)  14. Loose stools  Fibercon started on 12/17   Improving  LOS (Days) 5 A FACE TO FACE EVALUATION WAS PERFORMED  Shirl Weir [name] 12/16/2017 8:59 AM

## 2017-12-17 ENCOUNTER — Inpatient Hospital Stay (HOSPITAL_COMMUNITY): Payer: Medicare Other

## 2017-12-17 ENCOUNTER — Inpatient Hospital Stay (HOSPITAL_COMMUNITY): Payer: Medicare Other | Admitting: Occupational Therapy

## 2017-12-17 LAB — PROTIME-INR
INR: 1.99
PROTHROMBIN TIME: 22.4 s — AB (ref 11.4–15.2)

## 2017-12-17 LAB — GLUCOSE, CAPILLARY
GLUCOSE-CAPILLARY: 120 mg/dL — AB (ref 65–99)
GLUCOSE-CAPILLARY: 105 mg/dL — AB (ref 65–99)
Glucose-Capillary: 208 mg/dL — ABNORMAL HIGH (ref 65–99)
Glucose-Capillary: 127 mg/dL — ABNORMAL HIGH (ref 65–99)
Glucose-Capillary: 101 mg/dL — ABNORMAL HIGH (ref 65–99)

## 2017-12-17 LAB — CBC
HCT: 27.1 % — ABNORMAL LOW (ref 39.0–52.0)
HEMOGLOBIN: 8.3 g/dL — AB (ref 13.0–17.0)
MCH: 28.1 pg (ref 26.0–34.0)
MCHC: 30.6 g/dL (ref 30.0–36.0)
MCV: 91.9 fL (ref 78.0–100.0)
Platelets: 299 10*3/uL (ref 150–400)
RBC: 2.95 MIL/uL — AB (ref 4.22–5.81)
RDW: 19.9 % — ABNORMAL HIGH (ref 11.5–15.5)
WBC: 7.4 10*3/uL (ref 4.0–10.5)

## 2017-12-17 MED ORDER — DIPHENHYDRAMINE HCL 25 MG PO CAPS
25.0000 mg | ORAL_CAPSULE | Freq: Four times a day (QID) | ORAL | Status: DC | PRN
  Administered 2017-12-17: 25 mg via ORAL
  Filled 2017-12-17: qty 1

## 2017-12-17 MED ORDER — OXYCODONE HCL 5 MG PO TABS
5.0000 mg | ORAL_TABLET | Freq: Once | ORAL | Status: AC
  Administered 2017-12-17: 5 mg via ORAL
  Filled 2017-12-17: qty 1

## 2017-12-17 MED ORDER — WARFARIN SODIUM 5 MG PO TABS
10.0000 mg | ORAL_TABLET | Freq: Once | ORAL | Status: AC
  Administered 2017-12-17: 10 mg via ORAL
  Filled 2017-12-17: qty 2

## 2017-12-17 NOTE — Progress Notes (Signed)
Social Work Patient ID: Thomas [name], male   DOB: Jul 01, 1966, 51 y.o.   MRN: [id number]  Met with pt and wife who was here to discuss team conference goals mod/i-supervision level and target discharge 12/22. He feels she will be ready and will be glad to go home. He is aware he will go to HD prior to discharge home on Sat. Still practicing car transfers since this is the hardest transfer for him. Work toward discharge Sat.

## 2017-12-17 NOTE — Consult Note (Signed)
Thomas [name] is a 51 year old male with prior BKA and just had second leg with BKA after developing significant infection.  The patient has been working on coping and adjusting to now bilateral BKA.  His mood was initially distressed but has been showing progressive improvements and he is now coping well and working hard with rehab protocols .  He reports that he is not dealing with anxiety/depression like he had initially.  Will continue to monitor and be available for further consult if needed.  Worked on coping and adjustment skills today.

## 2017-12-17 NOTE — Progress Notes (Signed)
Social Work   Rilyn Upshaw, Eliezer Champagne  Social Worker  Physical Medicine and Rehabilitation  Patient Care Conference  Signed  Date of Service:  12/17/2017  1:26 PM          Signed          [] Hide copied text  [] Hover for details   Inpatient RehabilitationTeam Conference and Plan of Care Update Date: 12/17/2017   Time: 10:10 AM      Patient Name: Thomas [name]      Medical Record Number: [id number]  Date of Birth: 1965-12-31 Sex: Male         Room/Bed: 4M09C/4M09C-01 Payor Info: Payor: MEDICARE / Plan: MEDICARE PART A AND B / Product Type: *No Product type* /     Admitting Diagnosis: Bil BKA  Admit Date/Time:  12/11/2017  6:28 PM Admission Comments: No comment available    Primary Diagnosis:  Unilateral complete BKA, right, subsequent encounter ([place]) Principal Problem: Unilateral complete BKA, right, subsequent encounter [facility])       Patient Active Problem List    Diagnosis Date Noted  . Loose stools    . Benign essential HTN    . Subtherapeutic international normalized ratio (INR)    . History of left below knee amputation ([place]) 12/11/2017  . S/P bilateral below knee amputation ([place]) 12/11/2017  . Hypoxia    . History of supraventricular tachycardia    . Chronic diastolic congestive heart failure ([place])    . Unilateral complete BKA, right, subsequent encounter ([place])    . S/P bilateral BKA (below knee amputation) ([place])    . Acute blood loss anemia    . Anemia of chronic disease    . Leukocytosis    . Post-operative pain    . Non-ischemic cardiomyopathy ([place])    . Subacute osteomyelitis, right ankle and foot ([place])    . Wound infection    . Cutaneous abscess of right foot    . Hypokalemia 09/04/2017  . 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 Essential hypertension 09/03/2017  . GERD (gastroesophageal reflux disease) 09/03/2017  . Diabetic foot ulcer ([place]) 09/03/2017  . GIB (gastrointestinal bleeding) 07/26/2017  . Anemia due to end stage renal  disease ([place]) 07/26/2017  . PAF (paroxysmal atrial fibrillation) ([place]) 07/26/2017  . Symptomatic anemia 07/26/2017  . Fluid overload 05/05/2017  . Elevated troponin 05/05/2017  . Pulmonary edema 05/05/2017  . ESRD (end stage renal disease) ([place]) 05/05/2017  . HIV disease ([place]) 03/25/2017  . Hypertensive heart disease with end stage renal disease on dialysis ([place]) 03/13/2017  . ESRD on dialysis ([place]) 03/13/2017  . Type 2 diabetes mellitus with complication ([place]) 76/22/6333  . SVT (supraventricular tachycardia) [facility])        Expected Discharge Date: Expected Discharge Date: 12/20/17   Team Members Present: Physician leading conference: Dr. Delice Lesch Social Worker Present: Ovidio Kin, LCSW PT Present: Michaelene Song, PT OT Present: Clyda Greener, OT SLP Present: Charolett Bumpers, SLP PPS Coordinator present : Daiva Nakayama, RN, CRRN       Current Status/Progress Goal Weekly Team Focus  Medical     Decreased functional mobility secondary to right BKA 12/01/2017 as well as history of left BKA 3 years ago.   Improve pain, mobility, transfers, safety, ABLA, DM, loose stools  See above   Bowel/Bladder     continent of bowel and bladder LBM 12-15-17 olguric  remain continent of bowel and bladder   Assist with toileting needs prn   Swallow/Nutrition/  Hydration               ADL's     Min guard for scoot transfers to the drop arm commode, supervision for clothing management in sitting with lateral leans side to side.  Supervision for all bathing and dressing in sitting with lateral leans as well.   supervision to modified independent  selfcare retraining, balance retraining, transfer training, therapeutic exercise, pt/family education   Mobility     supervision bed mobility and even squat pivot transfers, min-mod sit<>stand RW/parallel bars, supervision w/c, have not initiated gait or stairs 2/2 pt anxiety w/ hops  mod I to supervision  uneven squat pivot transfers or stand pivot  transfers w/ RW, initiate gait short distances   Communication               Safety/Cognition/ Behavioral Observations             Pain     pain manage with oxycodone 10mg   q3 prn  pain > or = 3  Assess pain q shift and prn   Skin     right BKA staples blister noted  ace wrap  no skin issues   assess skin q shift and prn     *See Care Plan and progress notes for long and short-term goals.      Barriers to Discharge   Current Status/Progress Possible Resolutions Date Resolved   Physician     Medical stability;Wound Care;Weight     See above  Therapies, follow labs, optimize DM meds, stools improving      Nursing                 PT  Home environment access/layout  bed/bath on second floor, needs to ambulate into bathroom for bathing, stand pivot transfers to get to truck to get to/from dialysis              OT                 SLP            SW              Discharge Planning/Teaching Needs:  Home with wife who can assist, having a stair lift installed in home, concern about accessing the bathroom. Wife to measure doorway and work with PT on plan.      Team Conference: Goals supervision-mod/i level. Working on steps and car transfers. MD adjusting insulin and BP stable. Loose stools better. Wife here for education today. Stair lift installed in home yesterday.  Revisions to Treatment Plan:  DC 12/22    Continued Need for Acute Rehabilitation Level of Care: The patient requires daily medical management by a physician with specialized training in physical medicine and rehabilitation for the following conditions: Daily direction of a multidisciplinary physical rehabilitation program to ensure safe treatment while eliciting the highest outcome that is of practical value to the patient.: Yes Daily medical management of patient stability for increased activity during participation in an intensive rehabilitation regime.: Yes Daily analysis of laboratory values and/or radiology reports  with any subsequent need for medication adjustment of medical intervention for : Renal problems;Post surgical problems;Wound care problems;Diabetes problems   Elease Hashimoto 12/17/2017, 1:26 PM                 Patient ID: Taesean Reth, male   DOB: 09-30-1966, 51 y.o.   MRN: [id number]

## 2017-12-17 NOTE — Progress Notes (Signed)
Occupational Therapy Session Note  Patient Details  Name: Thomas [name] MRN: [id number] Date of Birth: 08/11/1966  Today's Date: 12/17/2017 OT Individual Time: 7939-6886 OT Individual Time Calculation (min): 27 min    Short Term Goals: Week 1:  OT Short Term Goal 1 (Week 1): STG=LTg  Skilled Therapeutic Interventions/Progress Updates:    Pt completed wheelchair mobility to the therapy gym with modified independence and increased time for rest breaks.  He was able to use the UE ergonometer for 3 sets of 4 mins each.  Resistance on first set peddling forward set at level 13.  He maintained RPMs at 25-30.  Second and third sets were completed peddling backwards with resistance set on level 11 and RPMs maintained the same.  Rest breaks of 1-2 mins between each set.  Finished session with therapist rolling pt back to the room with pt left up in wheelchair with family present.  Pt's spouse is going to measure the height of the [place] seat to determine if this is better to practice in.  She is going to bring the vehicle on Friday for actual practice.    Therapy Documentation Precautions:  Precautions Precautions: Fall Precaution Comments: wound vac RLE Required Braces or Orthoses: Other Brace/Splint Other Brace/Splint: LLE prothestic Restrictions Weight Bearing Restrictions: Yes RLE Weight Bearing: Non weight bearing LLE Weight Bearing: Weight bearing as tolerated  Pain: Pain Assessment Pain Assessment: No/denies pain ADL: See Function Navigator for Current Functional Status.   Therapy/Group: Individual Therapy  Sanchez Hemmer OTR/L 12/17/2017, 4:03 PM

## 2017-12-17 NOTE — Progress Notes (Signed)
Physical Therapy Session Note  Patient Details  Name: Thomas [name] MRN: [id number] Date of Birth: 01-26-1966  Today's Date: 12/17/2017 PT Individual Time: 1045-1130 PT Individual Time Calculation (min): 45 min   Short Term Goals: Week 1:  PT Short Term Goal 1 (Week 1): =LTG due to estimated LOS  Skilled Therapeutic Interventions/Progress Updates:    Patient in bed reports fell in shower this morning.  Ice on posterior aspect R residual limb.  Patient reports in pain as below, but eager to participate anyway.  Donned underwear and shorts in supine S, then up to sit with S, transferred to w/c scooting with S.  Boosted in w/c and assist to pull up pants and underwear.  Patient had donned prosthesis also with S at EOB  Patient propelled w/c to and from therapy gym x 150' each way with S and increased time.  In gym performed sit <> stand x 5 in parallel bars.  Discussed plans for d/c and concern for transfer into truck.  Patient encouraged despite fall this am.  Left in hallway due to NT in to change linens in bed and will assure pt in room safely when finished.   Therapy Documentation Precautions:  Precautions Precautions: Fall Precaution Comments: wound vac RLE Required Braces or Orthoses: Other Brace/Splint Other Brace/Splint: LLE prothestic Restrictions Weight Bearing Restrictions: Yes RLE Weight Bearing: Non weight bearing LLE Weight Bearing: Weight bearing as tolerated Pain: Pain Assessment Pain Assessment: No/denies pain Pain Score: 8  Pain Type: Acute pain Pain Location: Leg Pain Orientation: Right Pain Descriptors / Indicators: Sore(phantom) Pain Onset: On-going Pain Intervention(s): Distraction   See Function Navigator for Current Functional Status.   Therapy/Group: Individual Therapy  Reginia Naas 12/17/2017, 6:07 PM

## 2017-12-17 NOTE — Progress Notes (Signed)
Occupational Therapy Session Note  Patient Details  Name: Thomas [name] MRN: [id number] Date of Birth: [birth year]  Today's Date: 12/17/2017 OT Individual Time: 0905-1006 OT Individual Time Calculation (min): 61 min    Short Term Goals: Week 1:  OT Short Term Goal 1 (Week 1): STG=LTg  Skilled Therapeutic Interventions/Progress Updates:    Pt transferred from bed to wheelchair with supervision using his prosthesis on the LLE and completing squat pivot transfer.  He was then able to transfer from the wheelchair to the tub bench with supervision as well.  Before undressing he stated he had to toilet, so therapist brought drop arm commode next to the shower and he was able to scoot over with supervision as well.  All toileting tasks completed at supervision as well as scooting transfer back to the tub bench.  Pt maintained prosthesis on the LLE with all transfers until the final transfer into the shower where he removed it and handed all components to the therapist.  He completed all bathing in sitting with supervision and lateral leans for washing and drying peri area.  He re-donned prosthesis and then attempted scoot transfer out to the wheelchair from the tub bench.  In doing so his left foot with prosthesis slipped on the wet floor of the shower and he went down in front of the wheelchair to the floor.  Pt was assisted with transition to supine and nursing was called to get lift in order to get him up.  He complained of increased pain in the right residual limb, which nursing and PA examined after completion of session.  Maxi move lift utilized for moving pt from the floor to the bed to complete session.  Nursing in room to administer care.     Therapy Documentation Precautions:  Precautions Precautions: Fall Precaution Comments: wound vac RLE Required Braces or Orthoses: Other Brace/Splint Other Brace/Splint: LLE prothestic Restrictions Weight Bearing Restrictions: Yes RLE Weight Bearing: Non  weight bearing LLE Weight Bearing: Weight bearing as tolerated  Pain: Pain Assessment Pain Assessment: 0-10 Pain Score: 10-Worst pain ever ADL: See Function Navigator for Current Functional Status.   Therapy/Group: Individual Therapy  Nikolette Reindl OTR/L 12/17/2017, 12:38 PM

## 2017-12-17 NOTE — Progress Notes (Signed)
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 Has also atrial fibrillation and is was being anticoagulated with supratherapeutic INR   No complaints this morning   Interval History:TTS [facility] right AVF 4h 32min 3k/2.25 bath [id number] Hep none -mircera 225 q 2wks- last got 150 on 11/29 -hect 5 ug tiw -venofer 100 q wk    Objective:  Vital signs in last 24 hours:  Temp:  [98.3 F (36.8 C)-99 F (37.2 C)] 98.9 F (37.2 C) (12/19 0159) Pulse Rate:  [85-100] 98 (12/19 0159) Resp:  [12-28] 16 (12/19 0159) BP: (114-143)/(61-81) 127/61 (12/19 0159) SpO2:  [95 %-100 %] 96 % (12/19 0159) Weight:  [262 lb 5.6 oz (119 kg)-270 lb 1 oz (122.5 kg)] 262 lb 5.6 oz (119 kg) (12/19 0159)  Weight change:  Filed Weights   12/16/17 2040 12/17/17 0109 12/17/17 0159  Weight: 270 lb 1 oz (122.5 kg) 263 lb 7.2 oz (119.5 kg) 262 lb 5.6 oz (119 kg)    Intake/Output: I/O last 3 completed shifts: In: 720 [P.O.:720] Out: 3001 [Other:3001]   Intake/Output this shift:  No intake/output data recorded.  General:NAD, WDWN, sitting in wheelchair Heart:RRR, no murmur rub or gallop Lungs:CTAB, no wheeze, rales or rhonchi Abdomen:soft, NT, ND Extremities:b/l BKA, left prosthesis in place, R in sock, no significant edema Dialysis Access:right AVF + bruit     Basic Metabolic Panel: Recent Labs  Lab 12/11/17 1100 12/13/17 1423 12/16/17 2045  NA 136 133* 136  K 3.4* 3.6 3.5  CL 95* 95* 95*  CO2 25 25 24   GLUCOSE 177* 214* 145*  BUN 49* 50* 81*  CREATININE 9.66* 8.83* 12.41*  CALCIUM 9.8 9.8 9.9  PHOS 7.9* 7.9* 10.6*    Liver Function Tests: Recent Labs  Lab 12/11/17 1100 12/13/17 1423 12/16/17 2045  ALBUMIN 2.2* 2.3* 2.6*   No results for input(s): LIPASE, AMYLASE in the last 168 hours. No results for input(s): AMMONIA in the last 168 hours.  CBC: Recent Labs  Lab 12/14/17 0624  12/15/17 0534 12/16/17 0445 12/16/17 2045 12/17/17 0519  WBC 7.4 9.3 9.0 9.0 7.4  HGB 8.3* 8.4* 7.8* 7.9* 8.3*  HCT 26.9* 26.6* 25.1* 24.4* 27.1*  MCV 90.6 90.5 90.0 89.1 91.9  PLT 293 292 286 314 299    Cardiac Enzymes: No results for input(s): CKTOTAL, CKMB, CKMBINDEX, TROPONINI in the last 168 hours.  BNP: Invalid input(s): POCBNP  CBG: Recent Labs  Lab 12/16/17 1155 12/16/17 1522 12/16/17 1639 12/17/17 0136 12/17/17 0631  GLUCAP 117* 100* 98 120* 18*    Microbiology: Results for orders placed or performed during the hospital encounter of 12/05/17  Culture, blood (Routine X 2) w Reflex to ID Panel     Status: None   Collection Time: 12/05/17 11:40 AM  Result Value Ref Range Status   Specimen Description BLOOD LEFT FOREARM  Final   Special Requests   Final    BOTTLES DRAWN AEROBIC AND ANAEROBIC Blood Culture adequate volume   Culture NO GROWTH 5 DAYS  Final   Report Status 12/10/2017 FINAL  Final  Culture, blood (Routine X 2) w Reflex to ID Panel     Status: None   Collection Time: 12/05/17  1:13 PM  Result Value Ref Range Status   Specimen Description BLOOD LEFT FOREARM  Final   Special Requests   Final    BOTTLES DRAWN AEROBIC AND ANAEROBIC Blood Culture adequate volume  Culture NO GROWTH 5 DAYS  Final   Report Status 12/10/2017 FINAL  Final  Surgical pcr screen     Status: None   Collection Time: 12/05/17 10:29 PM  Result Value Ref Range Status   MRSA, PCR NEGATIVE NEGATIVE Final   Staphylococcus aureus NEGATIVE NEGATIVE Final    Comment: (NOTE) The Xpert SA Assay (FDA approved for NASAL specimens in patients 53 years of age and older), is one component of a comprehensive surveillance program. It is not intended to diagnose infection nor to guide or monitor treatment.     Coagulation Studies: Recent Labs    12/15/17 0534 12/16/17 0445 12/17/17 0519  LABPROT 19.6* 22.3* 22.4*  INR 1.67 1.98 1.99    Urinalysis: No results for input(s):  COLORURINE, LABSPEC, PHURINE, GLUCOSEU, HGBUR, BILIRUBINUR, KETONESUR, PROTEINUR, UROBILINOGEN, NITRITE, LEUKOCYTESUR in the last 72 hours.  Invalid input(s): APPERANCEUR    Imaging: No results found.   Medications:   . sodium chloride    . sodium chloride    . [START ON 12/18/2017] ferric gluconate (FERRLECIT/NULECIT) IV     . amLODipine  5 mg Oral QPM  . darbepoetin (ARANESP) injection - DIALYSIS  200 mcg Intravenous Q Sat-HD  . doxercalciferol  5 mcg Intravenous Q T,Th,Sa-HD  . feeding supplement (NEPRO CARB STEADY)  237 mL Oral BID BM  . feeding supplement (PRO-STAT SUGAR FREE 64)  30 mL Oral BID  . fosamprenavir  700 mg Oral BID  . insulin aspart  0-20 Units Subcutaneous TID WC  . insulin aspart  6 Units Subcutaneous TID WC  . insulin glargine  30 Units Subcutaneous QHS  . lipase/protease/amylase  12,000 Units Oral TID AC  . lisinopril  40 mg Oral Daily  . metoprolol tartrate  25 mg Oral BID  . multivitamin  1 tablet Oral QHS  . polycarbophil  625 mg Oral Daily  . raltegravir  400 mg Oral BID  . ritonavir  100 mg Oral Q breakfast  . Warfarin - Pharmacist Dosing Inpatient   Does not apply q1800   sodium chloride, sodium chloride, acetaminophen **OR** acetaminophen, alteplase, camphor-menthol, diphenhydrAMINE, heparin, lidocaine (PF), lidocaine-prilocaine, lip balm, ondansetron **OR** ondansetron (ZOFRAN) IV, oxyCODONE, pentafluoroprop-tetrafluoroeth, sorbitol  Assessment/ Plan:  1.S/p R BKA - now transferred to rehab. 2. ESRD- TTS,no heparin 3. Anemiaof CKD-Hgb 8.3, s/p 2 Units pRBC, 1 unit on 12/10 & 1 unit on 12/11, on 221mcg aranesp starting 12/15 and restarting weekly IV iron.   Hb 7.9 4. Secondary hyperparathyroidism- Ca 9.8, on hectorol, P 7.9,  5.HTN/volume- Volume appears ok, will need new EDW at d/c. Post wt yesterday 116.7kg. BP well controlled on current meds. Will remove volume as tolerated.  6. Nutrition- Alb 2.2, renal/carb modified diet. Nepro  TID.  7. SVT vs A flutter - on coumadin - per primary 8. HIV - per primary 9. DM - per primary     LOS: 6 Tish Begin W @TODAY @10 :32 AM

## 2017-12-17 NOTE — Progress Notes (Signed)
[place] PHYSICAL MEDICINE & REHABILITATION     PROGRESS NOTE  Subjective/Complaints:  Patient seen lying in bed this morning. He states he slept well after getting back from hemodialysis 2 AM. He states he is not going to do therapies this morning's result.  ROS: Denies nausea, vomiting, diarrhea, shortness of breath or chest pain   Objective: Vital Signs: Blood pressure 127/61, pulse 98, temperature 98.9 F (37.2 C), temperature source Oral, resp. rate 16, weight 119 kg (262 lb 5.6 oz), SpO2 96 %. No results found. Recent Labs    12/16/17 2045 12/17/17 0519  WBC 9.0 7.4  HGB 7.9* 8.3*  HCT 24.4* 27.1*  PLT 314 299   Recent Labs    12/16/17 2045  NA 136  K 3.5  CL 95*  GLUCOSE 145*  BUN 81*  CREATININE 12.41*  CALCIUM 9.9   CBG (last 3)  Recent Labs    12/16/17 1639 12/17/17 0136 12/17/17 0631  GLUCAP 98 120* 208*    Wt Readings from Last 3 Encounters:  12/17/17 119 kg (262 lb 5.6 oz)  12/11/17 116.7 kg (257 lb 4.4 oz)  12/05/17 129.3 kg (285 lb)    Physical Exam:  BP 127/61 (BP Location: Left Arm)   Pulse 98   Temp 98.9 F (37.2 C) (Oral)   Resp 16   Wt 119 kg (262 lb 5.6 oz)   SpO2 96%   BMI 30.32 kg/m  Constitutional: He appearswell-developed. NAD HENT: Normocephalic.  Atraumatic Eyes:EOMare normal. No discharge Cardiovascular:RRR. No JVD  Respiratory: CTA Bilaterally. Normal effort   GU:YQIHK sounds are normal. He exhibitsno distension. Neurological: He isalertand oriented. Motor: 5/5 in BUE, 5/5 Left HF RLE: HF: 4/5 (pain inhibition, unchanged).   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  Assessment/Plan: 1. 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 Physiatrist is providing close team supervision and 24 hour management of active medical problems listed  below. Physiatrist and rehab team continue to assess barriers to discharge/monitor patient progress toward functional and medical goals.  Function:  Bathing Bathing position   Position: Wheelchair/chair at sink  Bathing parts Body parts bathed by patient: Right arm, Left arm, Chest, Abdomen, Front perineal area, Right upper leg, Left upper leg Body parts bathed by helper: Buttocks  Bathing assist Assist Level: Supervision or verbal cues      Upper Body Dressing/Undressing Upper body dressing   What is the patient wearing?: Hospital gown, Orthosis             Orthosis activity level: Performed by helper  Upper body assist Assist Level: Touching or steadying assistance(Pt > 75%)      Lower Body Dressing/Undressing Lower body dressing   What is the patient wearing?: Hospital Gown                              Lower body assist        Toileting Toileting Toileting activity did not occur: No continent bowel/bladder event   Toileting steps completed by helper: Adjust clothing prior to toileting, Performs perineal hygiene, Adjust clothing after toileting(per Linden Dolin, NT)    Toileting assist     Transfers Chair/bed transfer   Chair/bed transfer method: Lateral scoot Chair/bed transfer assist level: Supervision or verbal cues Chair/bed transfer assistive device: Armrests  Locomotion Ambulation Ambulation activity did not occur: Safety/medical concerns(B BKA, poor standing with L prosthesis)         Wheelchair   Type: Manual Max wheelchair distance: 200 Assist Level: Supervision or verbal cues  Cognition Comprehension Comprehension assist level: Follows complex conversation/direction with no assist  Expression Expression assist level: Expresses complex ideas: With extra time/assistive device  Social Interaction Social Interaction assist level: Interacts appropriately with others - No medications needed.  Problem Solving Problem solving  assist level: Solves complex problems: Recognizes & self-corrects  Memory Memory assist level: Complete Independence: No helper    Medical Problem List and Plan: 1.Decreased functional mobilitysecondary to right BKA 12/01/2017 as well as history of left BKA 3 years ago.Patient does have a left prosthesis   Continue CIR 2. DVT Prophylaxis/Anticoagulation: Chronic Coumadin for history of SVT.Monitor for any bleeding episodes   INR subtherapeutic on 12/19 3. Pain Management:Oxycodone as needed 4. Mood:Provide emotional support 5. Neuropsych: This patientiscapable of making decisions on hisown behalf. 6. Skin/Wound Care:Routine skin checks 7. Fluids/Electrolytes/Nutrition:Routine I&O's  8.ESRD. Continue hemodialysis as per renal services 9.Acute on chronic anemia. Cont Aranesp   Hemoglobin 8.3 on 12/19 10.HIV.Continue current retroviral therapy 11.Diabetes mellitus with peripheralneuropathy. Hemoglobin A1c 7.7.    CBG (last 3)  Recent Labs    12/16/17 1639 12/17/17 0136 12/17/17 0631  GLUCAP 98 120* 208*     Increased Lantus 30 units on 12/15   Increase mealtime to 6u tid 12/16   Slightly labile this AM, likely due to sleep HD session  12.HTN/SVT.   Norvasc 5mg  daily, lisinopril 40 mg daily, Lopressor 25 mg twice a day.   Controlled on 12/19 13.Congestive heart failure: Monitor for any signs of fluid overload.  Regular weights per nephrology Filed Weights   12/16/17 2040 12/17/17 0109 12/17/17 0159  Weight: 122.5 kg (270 lb 1 oz) 119.5 kg (263 lb 7.2 oz) 119 kg (262 lb 5.6 oz)  14. Loose stools  Fibercon started on 12/17   Improving  LOS (Days) 6 A FACE TO FACE EVALUATION WAS PERFORMED  Thomas [name] Thomas [name] 12/17/2017 8:50 AM

## 2017-12-17 NOTE — Progress Notes (Signed)
Physical Therapy Session Note  Patient Details  Name: Thomas [name] MRN: [id number] Date of Birth: [birth year]  Today's Date: 12/17/2017 PT Individual Time: 1615-1700 PT Individual Time Calculation (min): 45 min   Short Term Goals: Week 1:  PT Short Term Goal 1 (Week 1): =LTG due to estimated LOS  Skilled Therapeutic Interventions/Progress Updates:    Pt seated in w/c upon PT arrival, agreeable to therapy tx and denies pain. Pt propelled w/c within the unit with supervision >200 ft. Pt performed car transfer with supervision using a slideboard. Pt and wife agree that having a slideboard at home would be beneficial for safety, especially with traveling to dialysis so often during the week. Pt and wife educated on the importance of HEP and skin care. Pt propelled w/c from gym>rehab apartment with supervision. Pt performed squat pivot transfer from w/c>bed with supervision. Pt performed A-P transfer from bed>w/c without prosthesis with supervision, this transfer as another option for the pt should he need it. Pt propelled w/c back to room and left seated in w/c with needs in reach.   Therapy Documentation Precautions:  Precautions Precautions: Fall Precaution Comments: wound vac RLE Required Braces or Orthoses: Other Brace/Splint Other Brace/Splint: LLE prothestic Restrictions Weight Bearing Restrictions: Yes RLE Weight Bearing: Non weight bearing LLE Weight Bearing: Weight bearing as tolerated   See Function Navigator for Current Functional Status.   Therapy/Group: Individual Therapy  Netta Corrigan, PT, DPT 12/17/2017, 7:51 AM

## 2017-12-17 NOTE — Progress Notes (Signed)
HD tx completed @ 0040 w/o problem, UF goal met, blood rinsed back, report called to Etheleen Nicks, RN

## 2017-12-17 NOTE — Patient Care Conference (Signed)
Inpatient RehabilitationTeam Conference and Plan of Care Update Date: 12/17/2017   Time: 10:10 AM    Patient Name: Thomas [name]      Medical Record Number: [id number]  Date of Birth: 06/07/1966 Sex: Male         Room/Bed: 4M09C/4M09C-01 Payor Info: Payor: MEDICARE / Plan: MEDICARE PART A AND B / Product Type: *No Product type* /    Admitting Diagnosis: Bil BKA  Admit Date/Time:  12/11/2017  6:28 PM Admission Comments: No comment available   Primary Diagnosis:  Unilateral complete BKA, right, subsequent encounter ([place]) Principal Problem: Unilateral complete BKA, right, subsequent encounter [facility])  Patient Active Problem List   Diagnosis Date Noted  . Loose stools   . Benign essential HTN   . Subtherapeutic international normalized ratio (INR)   . History of left below knee amputation ([place]) 12/11/2017  . S/P bilateral below knee amputation ([place]) 12/11/2017  . Hypoxia   . History of supraventricular tachycardia   . Chronic diastolic congestive heart failure ([place])   . Unilateral complete BKA, right, subsequent encounter ([place])   . S/P bilateral BKA (below knee amputation) ([place])   . Acute blood loss anemia   . Anemia of chronic disease   . Leukocytosis   . Post-operative pain   . Non-ischemic cardiomyopathy ([place])   . Subacute osteomyelitis, right ankle and foot ([place])   . Wound infection   . Cutaneous abscess of right foot   . Hypokalemia 09/04/2017  . Diabetic wet gangrene of the foot ([place]) 09/04/2017  . Essential hypertension 09/03/2017  . GERD (gastroesophageal reflux disease) 09/03/2017  . Diabetic foot ulcer ([place]) 09/03/2017  . GIB (gastrointestinal bleeding) 07/26/2017  . Anemia due to end stage renal disease ([place]) 07/26/2017  . PAF (paroxysmal atrial fibrillation) ([place]) 07/26/2017  . Symptomatic anemia 07/26/2017  . Fluid overload 05/05/2017  . Elevated troponin 05/05/2017  . Pulmonary edema 05/05/2017  . ESRD (end stage renal disease) ([place]) 05/05/2017  . HIV disease  ([place]) 03/25/2017  . Hypertensive heart disease with end stage renal disease on dialysis ([place]) 03/13/2017  . ESRD on dialysis ([place]) 03/13/2017  . Type 2 diabetes mellitus with complication ([place]) 74/16/3845  . SVT (supraventricular tachycardia) [facility])     Expected Discharge Date: Expected Discharge Date: 12/20/17  Team Members Present: Physician leading conference: Dr. Delice Lesch Social Worker Present: Ovidio Kin, LCSW PT Present: Michaelene Song, PT OT Present: Clyda Greener, OT SLP Present: Charolett Bumpers, SLP PPS Coordinator present : Daiva Nakayama, RN, CRRN     Current Status/Progress Goal Weekly Team Focus  Medical   Decreased functional mobility secondary to right BKA 12/01/2017 as well as history of left BKA 3 years ago.   Improve pain, mobility, transfers, safety, ABLA, DM, loose stools  See above   Bowel/Bladder   continent of bowel and bladder LBM 12-15-17 olguric  remain continent of bowel and bladder   Assist with toileting needs prn   Swallow/Nutrition/ Hydration             ADL's   Min guard for scoot transfers to the drop arm commode, supervision for clothing management in sitting with lateral leans side to side.  Supervision for all bathing and dressing in sitting with lateral leans as well.   supervision to modified independent  selfcare retraining, balance retraining, transfer training, therapeutic exercise, pt/family education   Mobility   supervision bed mobility and even squat pivot transfers, min-mod sit<>stand RW/parallel bars, supervision w/c, have not initiated gait or stairs 2/2  pt anxiety w/ hops  mod I to supervision  uneven squat pivot transfers or stand pivot transfers w/ RW, initiate gait short distances   Communication             Safety/Cognition/ Behavioral Observations            Pain   pain manage with oxycodone 10mg   q3 prn  pain > or = 3  Assess pain q shift and prn   Skin   right BKA staples blister noted  ace wrap  no skin issues    assess skin q shift and prn      *See Care Plan and progress notes for long and short-term goals.     Barriers to Discharge  Current Status/Progress Possible Resolutions Date Resolved   Physician    Medical stability;Wound Care;Weight     See above  Therapies, follow labs, optimize DM meds, stools improving      Nursing                  PT  Home environment access/layout  bed/bath on second floor, needs to ambulate into bathroom for bathing, stand pivot transfers to get to truck to get to/from dialysis              OT                  SLP                SW                Discharge Planning/Teaching Needs:  Home with wife who can assist, having a stair lift installed in home, concern about accessing the bathroom. Wife to measure doorway and work with PT on plan.      Team Conference: Goals supervision-mod/i level. Working on steps and car transfers. MD adjusting insulin and BP stable. Loose stools better. Wife here for education today. Stair lift installed in home yesterday.  Revisions to Treatment Plan:  DC 12/22    Continued Need for Acute Rehabilitation Level of Care: The patient requires daily medical management by a physician with specialized training in physical medicine and rehabilitation for the following conditions: Daily direction of a multidisciplinary physical rehabilitation program to ensure safe treatment while eliciting the highest outcome that is of practical value to the patient.: Yes Daily medical management of patient stability for increased activity during participation in an intensive rehabilitation regime.: Yes Daily analysis of laboratory values and/or radiology reports with any subsequent need for medication adjustment of medical intervention for : Renal problems;Post surgical problems;Wound care problems;Diabetes problems  Elease Hashimoto 12/17/2017, 1:26 PM

## 2017-12-17 NOTE — Progress Notes (Signed)
ANTICOAGULATION CONSULT NOTE - Follow-Up  Pharmacy Consult for warfarin Indication: atrial fibrillation  No Known Allergies  Vital Signs: Temp: 98.9 F (37.2 C) (12/19 1015) Temp Source: Oral (12/19 1015) BP: 129/74 (12/19 1015) Pulse Rate: 98 (12/19 1015)  Labs: Recent Labs    12/15/17 0534 12/16/17 0445 12/16/17 2045 12/17/17 0519  HGB 8.4* 7.8* 7.9* 8.3*  HCT 26.6* 25.1* 24.4* 27.1*  PLT 292 286 314 299  LABPROT 19.6* 22.3*  --  22.4*  INR 1.67 1.98  --  1.99  CREATININE  --   --  12.41*  --     Estimated Creatinine Clearance: 10.2 mL/min (A) (by C-G formula based on SCr of 12.41 mg/dL (H)).   Assessment: 80 YOM with AFib who was admitted with a supratherapeutic INR. INR was 3.46 on 12/7- patient received vitamin K 2.5mg  PO to reverse INR to <2 for surgery on 12/8. Warfarin was restarted on 12/9, then held 12/10 d/t CBC drop and restarted on the 11th. INR is 1.99 today, trending up. CBC low but stable and no bleeding noted at this time.   PTA Home dose of Warfarin was 10mg  daily except 7.5mg  on TTSat  Goal of Therapy:  INR 2-3 Monitor platelets by anticoagulation protocol: Yes   Plan:  Warfarin 10 mg x 1 dose today Monitor daily INR, CBC, clinical course, s/sx of bleed, PO intake, DDI   Maryanna Shape, PharmD, BCPS  Clinical Pharmacist  Pager: [phone]   12/17/2017 1:35 PM

## 2017-12-18 ENCOUNTER — Inpatient Hospital Stay (HOSPITAL_COMMUNITY): Payer: Medicare Other | Admitting: Physical Therapy

## 2017-12-18 ENCOUNTER — Inpatient Hospital Stay (HOSPITAL_COMMUNITY): Payer: Medicare Other | Admitting: Occupational Therapy

## 2017-12-18 ENCOUNTER — Encounter: Payer: Self-pay | Admitting: Physician Assistant

## 2017-12-18 DIAGNOSIS — W19XXXA Unspecified fall, initial encounter: Secondary | ICD-10-CM

## 2017-12-18 LAB — CBC
HCT: 25.4 % — ABNORMAL LOW (ref 39.0–52.0)
Hemoglobin: 8 g/dL — ABNORMAL LOW (ref 13.0–17.0)
MCH: 29 pg (ref 26.0–34.0)
MCHC: 31.5 g/dL (ref 30.0–36.0)
MCV: 92 fL (ref 78.0–100.0)
PLATELETS: 308 10*3/uL (ref 150–400)
RBC: 2.76 MIL/uL — AB (ref 4.22–5.81)
RDW: 20.2 % — AB (ref 11.5–15.5)
WBC: 7.7 10*3/uL (ref 4.0–10.5)

## 2017-12-18 LAB — GLUCOSE, CAPILLARY
GLUCOSE-CAPILLARY: 143 mg/dL — AB (ref 65–99)
GLUCOSE-CAPILLARY: 136 mg/dL — AB (ref 65–99)
Glucose-Capillary: 65 mg/dL (ref 65–99)
Glucose-Capillary: 95 mg/dL (ref 65–99)

## 2017-12-18 LAB — RENAL FUNCTION PANEL
ALBUMIN: 2.9 g/dL — AB (ref 3.5–5.0)
Anion gap: 13 (ref 5–15)
BUN: 58 mg/dL — AB (ref 6–20)
CALCIUM: 10.2 mg/dL (ref 8.9–10.3)
CHLORIDE: 101 mmol/L (ref 101–111)
CO2: 24 mmol/L (ref 22–32)
CREATININE: 10.03 mg/dL — AB (ref 0.61–1.24)
GFR, EST AFRICAN AMERICAN: 6 mL/min — AB (ref >60)
GFR, EST NON AFRICAN AMERICAN: 5 mL/min — AB (ref >60)
Glucose, Bld: 76 mg/dL (ref 65–99)
PHOSPHORUS: 9.2 mg/dL — AB (ref 2.5–4.6)
Potassium: 4.2 mmol/L (ref 3.5–5.1)
Sodium: 138 mmol/L (ref 135–145)

## 2017-12-18 LAB — PROTIME-INR
INR: 1.98
Prothrombin Time: 22.4 seconds — ABNORMAL HIGH (ref 11.4–15.2)

## 2017-12-18 MED ORDER — FERRIC CITRATE 1 GM 210 MG(FE) PO TABS
420.0000 mg | ORAL_TABLET | Freq: Three times a day (TID) | ORAL | Status: DC
  Administered 2017-12-18 – 2017-12-20 (×5): 420 mg via ORAL
  Filled 2017-12-18 (×9): qty 2

## 2017-12-18 MED ORDER — OXYCODONE HCL 5 MG PO TABS
ORAL_TABLET | ORAL | Status: AC
  Administered 2017-12-18: 10 mg via ORAL
  Filled 2017-12-18: qty 2

## 2017-12-18 MED ORDER — PENTAFLUOROPROP-TETRAFLUOROETH EX AERO: 1.0000 "application " | INHALATION_SPRAY | CUTANEOUS | Status: DC | PRN

## 2017-12-18 MED ORDER — DOXERCALCIFEROL 4 MCG/2ML IV SOLN
INTRAVENOUS | Status: AC
  Administered 2017-12-18: 5 ug via INTRAVENOUS
  Filled 2017-12-18: qty 4

## 2017-12-18 MED ORDER — LIDOCAINE HCL (PF) 1 % IJ SOLN: 5.0000 mL | INTRAMUSCULAR | Status: DC | PRN

## 2017-12-18 MED ORDER — LIDOCAINE-PRILOCAINE 2.5-2.5 % EX CREA
1.0000 "application " | TOPICAL_CREAM | CUTANEOUS | Status: DC | PRN
  Filled 2017-12-18: qty 5

## 2017-12-18 MED ORDER — SODIUM CHLORIDE 0.9 % IV SOLN: 100.0000 mL | INTRAVENOUS | Status: DC | PRN

## 2017-12-18 MED ORDER — WARFARIN SODIUM 5 MG PO TABS
10.0000 mg | ORAL_TABLET | Freq: Once | ORAL | Status: AC
  Administered 2017-12-18: 10 mg via ORAL
  Filled 2017-12-18: qty 2

## 2017-12-18 MED ORDER — HEPARIN SODIUM (PORCINE) 1000 UNIT/ML DIALYSIS
1000.0000 [IU] | INTRAMUSCULAR | Status: DC | PRN
  Filled 2017-12-18: qty 1

## 2017-12-18 MED ORDER — ALTEPLASE 2 MG IJ SOLR
2.0000 mg | Freq: Once | INTRAMUSCULAR | Status: DC | PRN
  Filled 2017-12-18: qty 2

## 2017-12-18 NOTE — Progress Notes (Signed)
Admit: 12/11/2017 LOS: 7  40M ESRD s/p R BKA 12/3 in CIR  Subjective:  In good spirits Doesn't like HD so late in the day when on CIR   12/19 0701 - 12/20 0700 In: 960 [P.O.:960] Out: -   Filed Weights   12/16/17 2040 12/17/17 0109 12/17/17 0159  Weight: 122.5 kg (270 lb 1 oz) 119.5 kg (263 lb 7.2 oz) 119 kg (262 lb 5.6 oz)    Scheduled Meds: . amLODipine  5 mg Oral QPM  . darbepoetin (ARANESP) injection - DIALYSIS  200 mcg Intravenous Q Sat-HD  . doxercalciferol  5 mcg Intravenous Q T,Th,Sa-HD  . feeding supplement (NEPRO CARB STEADY)  237 mL Oral BID BM  . feeding supplement (PRO-STAT SUGAR FREE 64)  30 mL Oral BID  . fosamprenavir  700 mg Oral BID  . insulin aspart  0-20 Units Subcutaneous TID WC  . insulin aspart  6 Units Subcutaneous TID WC  . insulin glargine  30 Units Subcutaneous QHS  . lipase/protease/amylase  12,000 Units Oral TID AC  . lisinopril  40 mg Oral Daily  . metoprolol tartrate  25 mg Oral BID  . multivitamin  1 tablet Oral QHS  . polycarbophil  625 mg Oral Daily  . raltegravir  400 mg Oral BID  . ritonavir  100 mg Oral Q breakfast  . Warfarin - Pharmacist Dosing Inpatient   Does not apply q1800   Continuous Infusions: . sodium chloride    . sodium chloride    . ferric gluconate (FERRLECIT/NULECIT) IV     PRN Meds:.sodium chloride, sodium chloride, acetaminophen **OR** acetaminophen, alteplase, camphor-menthol, diphenhydrAMINE, heparin, lidocaine (PF), lidocaine-prilocaine, lip balm, ondansetron **OR** ondansetron (ZOFRAN) IV, oxyCODONE, pentafluoroprop-tetrafluoroeth, sorbitol  Current Labs: reviewed    Physical Exam:  Blood pressure 123/78, pulse 87, temperature 98.3 F (36.8 C), temperature source Oral, resp. rate 18, weight 119 kg (262 lb 5.6 oz), SpO2 95 %. B/l BKAs NAD RRR CTAB +B/T of AVF  Dialysis Orders: TTS [facility] right AVF 4h 10min 3k/2.25 bath 123kg Hep none -mircera 225 q 2wks- last got 150 on 11/29 -hect 5 ug  tiw -venofer 100 q wk   A 1. ESRD THS [facility] 2. S/p R BKA 12/3 in CIR 3. HIV on ARVs 4. 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 Anemia of CKD 6. HTN/Vol  P 1. Cont on THS Schedule: AVF, 3K bath, probe for new EDW 3-4L UF tomorrog goal; no heparin; 400/800; 2.25 Ca 2. Start aurxyia for high [employer] MD 12/18/2017, 10:33 AM  Recent Labs  Lab 12/11/17 1100 12/13/17 1423 12/16/17 2045  NA 136 133* 136  K 3.4* 3.6 3.5  CL 95* 95* 95*  CO2 25 25 24   GLUCOSE 177* 214* 145*  BUN 49* 50* 81*  CREATININE 9.66* 8.83* 12.41*  CALCIUM 9.8 9.8 9.9  PHOS 7.9* 7.9* 10.6*   Recent Labs  Lab 12/16/17 0445 12/16/17 2045 12/17/17 0519  WBC 9.0 9.0 7.4  HGB 7.8* 7.9* 8.3*  HCT 25.1* 24.4* 27.1*  MCV 90.0 89.1 91.9  PLT 286 314 299

## 2017-12-18 NOTE — Progress Notes (Signed)
Physical Therapy Session Note  Patient Details  Name: Thomas [name] MRN: [id number] Date of Birth: [birth year]  Today's Date: 12/18/2017 PT Individual Time: 0905-1015 PT Individual Time Calculation (min): 70 min   Short Term Goals: Week 1:  PT Short Term Goal 1 (Week 1): =LTG due to estimated LOS  Skilled Therapeutic Interventions/Progress Updates:   Pt received sitting in WC and agreeable to PT  PT instructed pt in WC mobility in simulated community environment through hall of hospital and through gift shop 2 seated rest breaks. PT transported Pt to ortho gym. Lateral scoot transfer to mat table. Seated BLE therex LAQ. AROM BLE x 2x10 ( Prosthetic on the LLE). HS curls AROM on the R and Level 2 tband on the L completed 2 x 12. Hip abduction/adduction x 12 BLE with manual resistance. Press ups with 6 inch lifts 2x 8. Throughout therex, pt required multiple rest breaks due to pain in the L residual limb. He reports tightness and tenderness due to blood pooling in the distal residual limb. Following therex, Patient returned to room and left sitting in [employer] with call bell in reach and all needs met.        Therapy Documentation Precautions:  Precautions Precautions: Fall Precaution Comments: wound vac RLE Required Braces or Orthoses: Other Brace/Splint Other Brace/Splint: LLE prothestic Restrictions Weight Bearing Restrictions: Yes RLE Weight Bearing: Non weight bearing LLE Weight Bearing: Weight bearing as tolerated    Pain: Pain Assessment Pain Assessment: 0-10 Pain Score: 8  Pain Type: Acute pain  See Function Navigator for Current Functional Status.   Therapy/Group: Individual Therapy  [name] 12/18/2017, 10:17 AM

## 2017-12-18 NOTE — Progress Notes (Signed)
Occupational Therapy Session Note  Patient Details  Name: Thomas [name] MRN: [id number] Date of Birth: Aug 27, 1966  Today's Date: 12/18/2017 OT Individual Time: 5521-7471 OT Individual Time Calculation (min): 28 min    Short Term Goals: Week 1:  OT Short Term Goal 1 (Week 1): STG=LTg  Skilled Therapeutic Interventions/Progress Updates:    Pt presents supine in bed, RN completing wrapping of residual limb. Pt reporting increased pain in RLE with RN administering pain meds during session. Pt completes LB dressing at bed level, requesting privacy during task completion with OT providing distant supervision from doorway. Pt completes UB dressing with setup. Completes squat pivot EOB> w/c with minGuard assist, Pt reporting urgency to toilet, completing squat pivot w/c>BSC with MinGuard and performing lateral leans for clothing management and pericare. Pt with MinA for return to w/c, completing grooming ADLs seated in w/c at sink. Pt required mod-maxA for advancing shorts towards hips while seated in w/c, using pushup method from w/c and lateral leans. Pt requesting to use parallel bars in gym to fully advance over hips however due to time constraints unable to fulfill Pt's requests. Pt declines returning to EOB to complete task, stating he is comfortable and will wait until next therapy to fully adjust LB clothing. Pt left seated in w/c, needs within reach.   Therapy Documentation Precautions:  Precautions Precautions: Fall Precaution Comments: wound vac RLE Required Braces or Orthoses: Other Brace/Splint Other Brace/Splint: LLE prothestic Restrictions Weight Bearing Restrictions: Yes RLE Weight Bearing: Non weight bearing LLE Weight Bearing: Weight bearing as tolerated    See Function Navigator for Current Functional Status.   Therapy/Group: Individual Therapy  Raymondo Band 12/18/2017, 12:28 PM

## 2017-12-18 NOTE — Progress Notes (Signed)
Hypoglycemic Event  CBG:65  Treatment: 15 GM carbohydrate snack  Symptoms: None  Follow-up CBG: Time:0710 CBG Result:95  Possible Reasons for Event: Unknown  Comments/MD notified:Dan A PA    Etheleen Nicks

## 2017-12-18 NOTE — Progress Notes (Signed)
Patient arrived to unit per bed.  Reviewed treatment plan and this RN agrees.  Report received from bedside RN, Vikki Ports.  Consent verified.  Patient A & O X 4. Lung sounds diminished and clear to ausculation in all fields. RLE generalized non pitting edema. Cardiac: NSR.  Prepped RLAVF with alcohol and cannulated with two 15 gauge needles.  Pulsation of blood noted.  Flushed access well with saline per protocol.  Connected and secured lines and initiated tx at 1530.  UF goal of 3500 mL and net fluid removal of 3000 mL.  Will continue to monitor.

## 2017-12-18 NOTE — Discharge Summary (Signed)
Discharge summary job # [id number]

## 2017-12-18 NOTE — Progress Notes (Signed)
[facility] PHYSICAL MEDICINE & REHABILITATION     PROGRESS NOTE  Subjective/Complaints:  Pt seen laying in bed this AM.  He slept well overnight.  He had a fall yesterday afternoon on his stump and notes some increase in pain.   ROS: +Right stump pain. Denies nausea, vomiting, diarrhea, shortness of breath or chest pain   Objective: Vital Signs: Blood pressure 123/78, pulse 87, temperature 98.3 F (36.8 C), temperature source Oral, resp. rate 18, weight 119 kg (262 lb 5.6 oz), SpO2 95 %. No results found. Recent Labs    12/16/17 2045 12/17/17 0519  WBC 9.0 7.4  HGB 7.9* 8.3*  HCT 24.4* 27.1*  PLT 314 299   Recent Labs    12/16/17 2045  NA 136  K 3.5  CL 95*  GLUCOSE 145*  BUN 81*  CREATININE 12.41*  CALCIUM 9.9   CBG (last 3)  Recent Labs    12/17/17 2109 12/18/17 0627 12/18/17 0709  GLUCAP 101* 65 95    Wt Readings from Last 3 Encounters:  12/17/17 119 kg (262 lb 5.6 oz)  12/11/17 116.7 kg (257 lb 4.4 oz)  12/05/17 129.3 kg (285 lb)    Physical Exam:  BP 123/78 (BP Location: Left Arm)   Pulse 87   Temp 98.3 F (36.8 C) (Oral)   Resp 18   Wt 119 kg (262 lb 5.6 oz)   SpO2 95%   BMI 30.32 kg/m  Constitutional: He appearswell-developed. NAD HENT: Normocephalic.  Atraumatic Eyes:EOMare normal. No discharge Cardiovascular: RRR. No JVD  Respiratory: CTA Bilaterally. Normal effort   TI:RWERX sounds are normal. He exhibitsno distension. Neurological: He isalertand oriented. Motor: 5/5 in BUE, 5/5 Left HF RLE: HF: 4/5 (pain inhibition, stan;e.   Skin: Right BKA with staples dried blood along staples, otherwise c/d/i Old left BKA is well healed Psych: Agitated.  Assessment/Plan: 1. 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 Physiatrist is providing close team supervision and 24 hour management of active medical  problems listed below. Physiatrist and rehab team continue to assess barriers to discharge/monitor patient progress toward functional and medical goals.  Function:  Bathing Bathing position   Position: Shower  Bathing parts Body parts bathed by patient: Right arm, Left arm, Chest, Abdomen, Front perineal area, Right upper leg, Left upper leg, Buttocks, Back Body parts bathed by helper: Buttocks  Bathing assist Assist Level: Supervision or verbal cues      Upper Body Dressing/Undressing Upper body dressing   What is the patient wearing?: Pull over shirt/dress     Pull over shirt/dress - Perfomed by patient: Thread/unthread right sleeve, Thread/unthread left sleeve, Put head through opening, Pull shirt over trunk       Orthosis activity level: Performed by helper  Upper body assist Assist Level: Set up      Lower Body Dressing/Undressing Lower body dressing   What is the patient wearing?: Underwear, Pants(L LE prosthesis) Underwear - Performed by patient: Thread/unthread right underwear leg, Thread/unthread left underwear leg Underwear - Performed by helper: Pull underwear up/down Pants- Performed by patient: Thread/unthread right pants leg, Thread/unthread left pants leg Pants- Performed by helper: Pull pants up/down                      Lower body assist        Toileting Toileting Toileting activity did not occur: No continent bowel/bladder event  Toileting steps completed by patient: Adjust clothing prior to toileting, Performs perineal hygiene, Adjust clothing after toileting Toileting steps completed by helper: Adjust clothing prior to toileting, Performs perineal hygiene, Adjust clothing after toileting(per Linden Dolin, NT)    Toileting assist Assist level: Supervision or verbal cues   Transfers Chair/bed transfer   Chair/bed transfer method: Squat pivot Chair/bed transfer assist level: Supervision or verbal cues Chair/bed transfer assistive device:  Armrests     Locomotion Ambulation Ambulation activity did not occur: Safety/medical concerns(B BKA, poor standing with L prosthesis)         Wheelchair   Type: Manual Max wheelchair distance: 150 Assist Level: Supervision or verbal cues  Cognition Comprehension Comprehension assist level: Follows complex conversation/direction with no assist  Expression Expression assist level: Expresses complex ideas: With extra time/assistive device  Social Interaction Social Interaction assist level: Interacts appropriately with others - No medications needed.  Problem Solving Problem solving assist level: Solves complex problems: Recognizes & self-corrects  Memory Memory assist level: Complete Independence: No helper    Medical Problem List and Plan: 1.Decreased functional mobilitysecondary to right BKA 12/01/2017 as well as history of left BKA 3 years ago.Patient does have a left prosthesis   Continue CIR 2. DVT Prophylaxis/Anticoagulation: Chronic Coumadin for history of SVT.Monitor for any bleeding episodes   INR subtherapeutic on 12/20 3. Pain Management:Oxycodone as needed 4. Mood:Provide emotional support 5. Neuropsych: This patientiscapable of making decisions on hisown behalf. 6. Skin/Wound Care:Routine skin checks 7. Fluids/Electrolytes/Nutrition:Routine I&O's  8.ESRD. Continue hemodialysis as per renal services 9.Acute on chronic anemia. Cont Aranesp   Hemoglobin 8.3 on 12/19 10.HIV.Continue current retroviral therapy 11.Diabetes mellitus with peripheralneuropathy. Hemoglobin A1c 7.7.    CBG (last 3)  Recent Labs    12/17/17 2109 12/18/17 0627 12/18/17 0709  GLUCAP 101* 65 95     Increased Lantus 30 units on 12/15   Increase mealtime to 6u tid 12/16   Relatively controlled on 12/20  12.HTN/SVT.   Norvasc 5mg  daily, lisinopril 40 mg daily, Lopressor 25 mg twice a day.   Controlled on 12/20 13.Congestive heart failure: Monitor for any signs of fluid  overload.  Regular weights per nephrology Filed Weights   12/16/17 2040 12/17/17 0109 12/17/17 0159  Weight: 122.5 kg (270 lb 1 oz) 119.5 kg (263 lb 7.2 oz) 119 kg (262 lb 5.6 oz)  14. Loose stools  Fibercon started on 12/17   Improving 15. Fall on stump  Cont to monitor for trauma  LOS (Days) 7 A FACE TO FACE EVALUATION WAS PERFORMED  Thomas [name] 12/18/2017 9:31 AM

## 2017-12-18 NOTE — Progress Notes (Signed)
Occupational Therapy Session Note  Patient Details  Name: Thomas [name] MRN: [id number] Date of Birth: 07/20/1966  Today's Date: 12/18/2017 OT Individual Time: 1119-1200 OT Individual Time Calculation (min): 41 min    Short Term Goals: Week 1:  OT Short Term Goal 1 (Week 1): STG=LTg  Skilled Therapeutic Interventions/Progress Updates:    Pt completed wheelchair mobility to the therapy gym with modified independence and 1-2 rest breaks.  Once in the gym he completed scoot pivot transfer to the [facility] with supervision.  Had pt utilize orange and green therapy band.  He completed one set each for shoulder flexion, internal/external rotation, shoulder abduction, triceps extension, and shoulder row.  Repetitions for shoulder movements were at 10 repetitions while they were increased to 20 repetitions for rows and for triceps extension.  Pt with 1-2 minute rest breaks in-between sessions.  Finished session with transfer back to the wheelchair and back in the room.  Call button and phone in reach.    Therapy Documentation Precautions:  Precautions Precautions: Fall Precaution Comments: wound vac RLE Required Braces or Orthoses: Other Brace/Splint Other Brace/Splint: LLE prothestic Restrictions Weight Bearing Restrictions: Yes RLE Weight Bearing: Non weight bearing LLE Weight Bearing: Weight bearing as tolerated  Pain: Pain Assessment Pain Assessment: Faces Faces Pain Scale: Hurts little more Pain Type: Surgical pain Pain Location: Leg Pain Orientation: Left Pain Intervention(s): Repositioned ADL: See Function Navigator for Current Functional Status.   Therapy/Group: Individual Therapy  Joice Nazario OTR/L 12/18/2017, 12:42 PM

## 2017-12-18 NOTE — Procedures (Signed)
I have personally attended this patient's dialysis session.   [facility] TTS) 3K bath (K 3.5) Hb 8 Next Aranesp due Saturday  4 hours 2-3L goal R AVF no issues Will have new EDW at discharge (Saturday projected d/c date)  Jamal Maes, MD [place] Pager 12/18/2017, 4:24 PM

## 2017-12-18 NOTE — Progress Notes (Signed)
Occupational Therapy Session Note  Patient Details  Name: Thomas [name] MRN: [id number] Date of Birth: 1966/03/17  Today's Date: 12/18/2017 OT Individual Time: 6213-0865 OT Individual Time Calculation (min): 41 min    Short Term Goals: Week 1:  OT Short Term Goal 1 (Week 1): STG=LTg  Skilled Therapeutic Interventions/Progress Updates:    Pt completed transfers to the drop arm commode and the bed in the ADL apartment with supervision and min instructional cueing for setup.  He demonstrates greater independence and confidence with transferring up to the higher bed toward the right side.  He was able to push his wheelchair around the unit with modified independence.  Last part of session had pt complete 3 sets on the UE ergonometer.  First set was completed in 3 mins peddling forward with intensity at level 15 and RPMs maintained at 25-30.  Next set was with intensity at the same level peddling backwards but pt was only able to complete it for 1 min and 15 seconds.  Final set was for 2 mins with intensity at level 15 again peddling backwards.  Finished session with therapist pushing pt back to the room via wheelchair.  Call button and phone in reach.    Therapy Documentation Precautions:  Precautions Precautions: Fall Precaution Comments: wound vac RLE Required Braces or Orthoses: Other Brace/Splint Other Brace/Splint: LLE prothestic Restrictions Weight Bearing Restrictions: Yes RLE Weight Bearing: Non weight bearing LLE Weight Bearing: Weight bearing as tolerated  Pain: Pain Assessment Pain Assessment: Faces Faces Pain Scale: Hurts little more Pain Type: Surgical pain Pain Location: Leg Pain Orientation: Left Pain Intervention(s): Repositioned ADL: See Function Navigator for Current Functional Status.   Therapy/Group: Individual Therapy  Donyel Castagnola OTR/L 12/18/2017, 4:01 PM

## 2017-12-18 NOTE — Progress Notes (Signed)
Dialysis treatment completed.  3500 mL ultrafiltrated and net fluid removal 3000 mL.    Patient status unchanged. Lung sounds diminished to ausculation in all fields. Generalized edema. Cardiac: NSR.  Disconnected lines and removed needles.  Pressure held for 10 minutes and band aid/gauze dressing applied.  Report given to bedside RN, Vikki Ports.

## 2017-12-18 NOTE — Progress Notes (Signed)
ANTICOAGULATION CONSULT NOTE - Follow-Up  Pharmacy Consult for warfarin Indication: atrial fibrillation  No Known Allergies  Vital Signs: Temp: 98.3 F (36.8 C) (12/20 0500) Temp Source: Oral (12/20 0500) BP: 123/78 (12/20 0500) Pulse Rate: 87 (12/20 0500)  Labs: Recent Labs    12/16/17 0445 12/16/17 2045 12/17/17 0519 12/18/17 0628  HGB 7.8* 7.9* 8.3*  --   HCT 25.1* 24.4* 27.1*  --   PLT 286 314 299  --   LABPROT 22.3*  --  22.4* 22.4*  INR 1.98  --  1.99 1.98  CREATININE  --  12.41*  --   --     Estimated Creatinine Clearance: 10.2 mL/min (A) (by C-G formula based on SCr of 12.41 mg/dL (H)).   Assessment: 76 YOM continue on warfarin for AFib. INR is 1.98 today. CBC low but stable and no bleeding noted at this time.   PTA Home dose of Warfarin was 10mg  daily except 7.5mg  on TTSat  Goal of Therapy:  INR 2-3 Monitor platelets by anticoagulation protocol: Yes   Plan:  Warfarin 10 mg x 1 dose today Monitor daily INR, CBC, clinical course, s/sx of bleed, PO intake, DDI   Maryanna Shape, PharmD, BCPS  Clinical Pharmacist  Pager: [phone]   12/18/2017 1:09 PM

## 2017-12-19 ENCOUNTER — Inpatient Hospital Stay (HOSPITAL_COMMUNITY): Payer: Medicare Other | Admitting: Occupational Therapy

## 2017-12-19 ENCOUNTER — Inpatient Hospital Stay (HOSPITAL_COMMUNITY): Payer: Medicare Other

## 2017-12-19 ENCOUNTER — Inpatient Hospital Stay (HOSPITAL_COMMUNITY): Payer: Medicare Other | Admitting: Physical Therapy

## 2017-12-19 LAB — GLUCOSE, CAPILLARY
Glucose-Capillary: 104 mg/dL — ABNORMAL HIGH (ref 65–99)
Glucose-Capillary: 185 mg/dL — ABNORMAL HIGH (ref 65–99)
Glucose-Capillary: 99 mg/dL (ref 65–99)
Glucose-Capillary: 169 mg/dL — ABNORMAL HIGH (ref 65–99)

## 2017-12-19 LAB — PROTIME-INR
INR: 1.77
Prothrombin Time: 20.5 seconds — ABNORMAL HIGH (ref 11.4–15.2)

## 2017-12-19 MED ORDER — CALCIUM POLYCARBOPHIL 625 MG PO TABS: 625.0000 mg | ORAL_TABLET | Freq: Every day | ORAL | 0 refills | Status: DC

## 2017-12-19 MED ORDER — AMLODIPINE BESYLATE 5 MG PO TABS: 5.0000 mg | ORAL_TABLET | Freq: Every day | ORAL | 0 refills | Status: DC

## 2017-12-19 MED ORDER — LISINOPRIL 40 MG PO TABS: 40.0000 mg | ORAL_TABLET | Freq: Every day | ORAL | 0 refills | Status: DC

## 2017-12-19 MED ORDER — INSULIN GLARGINE 100 UNITS/ML SOLOSTAR PEN
30.0000 [IU] | PEN_INJECTOR | Freq: Every day | SUBCUTANEOUS | 11 refills | Status: DC
Start: 2017-12-19 — End: 2019-09-24

## 2017-12-19 MED ORDER — PANCRELIPASE (LIP-PROT-AMYL) 12000-38000 UNITS PO CPEP: 12000.0000 [IU] | ORAL_CAPSULE | Freq: Three times a day (TID) | ORAL | 0 refills | Status: DC

## 2017-12-19 MED ORDER — FERRIC CITRATE 1 GM 210 MG(FE) PO TABS: 420.0000 mg | ORAL_TABLET | Freq: Three times a day (TID) | ORAL | 0 refills | Status: DC

## 2017-12-19 MED ORDER — OXYCODONE HCL 10 MG PO TABS: 10.0000 mg | ORAL_TABLET | ORAL | 0 refills | Status: DC | PRN

## 2017-12-19 MED ORDER — METOPROLOL TARTRATE 25 MG PO TABS: 25.0000 mg | ORAL_TABLET | Freq: Two times a day (BID) | ORAL | 0 refills | Status: DC

## 2017-12-19 MED ORDER — RENA-VITE PO TABS: 1.0000 | ORAL_TABLET | Freq: Every day | ORAL | 0 refills | Status: DC

## 2017-12-19 MED ORDER — WARFARIN SODIUM 7.5 MG PO TABS
12.5000 mg | ORAL_TABLET | Freq: Once | ORAL | Status: AC
  Administered 2017-12-19: 18:00:00 12.5 mg via ORAL
  Filled 2017-12-19: qty 1

## 2017-12-19 NOTE — Progress Notes (Signed)
Social Work  Discharge Note  The overall goal for the admission was met for: Sat discharge 12/22  Discharge location: Yes-HOME WITH WIFE WHO CAN PROVIDE 24 HR SUPERVISION LEVEL  Length of Stay: Yes-9 DAYS  Discharge activity level: Yes-SUPERVISION LEVEL  Home/community participation: Yes  Services provided included: MD, RD, PT, OT, RN, CM, Pharmacy, Neuropsych and SW  Financial Services: Medicare and Private Insurance: [employer]  Follow-up services arranged: Home Health: [facility], DME: [place], [place] and Patient/Family has no preference for HH/DME agencies  Comments (or additional information):[name] WAS [place]. [place] T, TH SAT. ON FIRST RUN FOR HD SAT PRIOR TO DISCHARGE HOME.  Patient/Family verbalized understanding of follow-up arrangements: Yes  Individual responsible for coordination of the follow-up plan: [place]  Confirmed correct DME delivered: Elease Hashimoto 12/19/2017    Elease Hashimoto

## 2017-12-19 NOTE — Progress Notes (Signed)
Admit: 12/11/2017 LOS: 8  38M ESRD s/p R BKA 12/3 in CIR  Subjective:  HD yesterday: post weight 119.8kg, 3L UF; tolerated well; full Treatment No co/ feels well For DC 12/22  12/20 0701 - 12/21 0700 In: 970 [P.O.:970] Out: 3000   Filed Weights   12/17/17 0159 12/18/17 1523 12/18/17 1930  Weight: 119 kg (262 lb 5.6 oz) 122.8 kg (270 lb 11.6 oz) 119.8 kg (264 lb 1.8 oz)    Scheduled Meds: . amLODipine  5 mg Oral QPM  . darbepoetin (ARANESP) injection - DIALYSIS  200 mcg Intravenous Q Sat-HD  . doxercalciferol  5 mcg Intravenous Q T,Th,Sa-HD  . feeding supplement (NEPRO CARB STEADY)  237 mL Oral BID BM  . feeding supplement (PRO-STAT SUGAR FREE 64)  30 mL Oral BID  . ferric citrate  420 mg Oral TID WC  . fosamprenavir  700 mg Oral BID  . insulin aspart  0-20 Units Subcutaneous TID WC  . insulin aspart  6 Units Subcutaneous TID WC  . insulin glargine  30 Units Subcutaneous QHS  . lipase/protease/amylase  12,000 Units Oral TID AC  . lisinopril  40 mg Oral Daily  . metoprolol tartrate  25 mg Oral BID  . multivitamin  1 tablet Oral QHS  . polycarbophil  625 mg Oral Daily  . raltegravir  400 mg Oral BID  . ritonavir  100 mg Oral Q breakfast  . Warfarin - Pharmacist Dosing Inpatient   Does not apply q1800   Continuous Infusions: . ferric gluconate (FERRLECIT/NULECIT) IV Stopped (12/18/17 1857)   PRN Meds:.acetaminophen **OR** acetaminophen, camphor-menthol, diphenhydrAMINE, lip balm, ondansetron **OR** ondansetron (ZOFRAN) IV, oxyCODONE, sorbitol  Current Labs: reviewed    Physical Exam:  Blood pressure 112/76, pulse (!) 125, temperature 98.7 F (37.1 C), temperature source Oral, resp. rate 18, weight 119.8 kg (264 lb 1.8 oz), SpO2 99 %. B/l BKAs NAD RRR CTAB +B/T of AVF  Dialysis Orders: TTS [facility] right AVF 4h 70min 3k/2.25 bath 123kg Hep none -mircera 225 q 2wks- last got 150 on 11/29 -hect 5 ug tiw -venofer 100 q wk   A 1. ESRD THS [facility] 2. S/p R BKA  12/3 in CIR, DC 12/22 3. HIV on ARVs 4. 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 Anemia of CKD 6. HTN/Vol  P 1. Cont on THS Schedule: AVF, 3K bath, probe for new EDW 3-4L UF tomorrow goal; no heparin; 400/800; 2.25 Ca 2. First shift tomorrow AM given discharge   Pearson Grippe MD 12/19/2017, 10:07 AM  Recent Labs  Lab 12/13/17 1423 12/16/17 2045 12/18/17 1530  NA 133* 136 138  K 3.6 3.5 4.2  CL 95* 95* 101  CO2 25 24 24   GLUCOSE 214* 145* 76  BUN 50* 81* 58*  CREATININE 8.83* 12.41* 10.03*  CALCIUM 9.8 9.9 10.2  PHOS 7.9* 10.6* 9.2*   Recent Labs  Lab 12/16/17 2045 12/17/17 0519 12/18/17 1530  WBC 9.0 7.4 7.7  HGB 7.9* 8.3* 8.0*  HCT 24.4* 27.1* 25.4*  MCV 89.1 91.9 92.0  PLT 314 299 308

## 2017-12-19 NOTE — Progress Notes (Signed)
Occupational Therapy Session Note  Patient Details  Name: Thomas [name] MRN: [id number] Date of Birth: 06/09/1966  Today's Date: 12/19/2017 OT Individual Time: 1130-1200 OT Individual Time Calculation (min): 30 min    Short Term Goals: Week 1:  OT Short Term Goal 1 (Week 1): STG=LTg  Skilled Therapeutic Interventions/Progress Updates:    1:1. No c/o pain. Pt requesting to work on UE exercise. Pt propels w/c to/from tx gym with MOD I and increased time for rest breaks. Pt completes 1x15 towel glides up wall against gravity for improved BUE strength/endurance required for functional transfers. Pt reporting excited to go home tomorrow. Exited session with pt seated in w/c with call light in reach and all needs met  Therapy Documentation Precautions:  Precautions Precautions: Fall Precaution Comments: wound vac RLE Required Braces or Orthoses: Other Brace/Splint Other Brace/Splint: LLE prothestic Restrictions Weight Bearing Restrictions: Yes RLE Weight Bearing: Non weight bearing LLE Weight Bearing: Weight bearing as tolerated General:    See Function Navigator for Current Functional Status.   Therapy/Group: Individual Therapy  [name] 12/19/2017, 12:14 PM

## 2017-12-19 NOTE — Progress Notes (Signed)
Occupational Therapy Session Note  Patient Details  Name: Kwan Shellhammer MRN: [id number] Date of Birth: 1966/11/07  Today's Date: 12/19/2017 OT Individual Time: 1004-1100 OT Individual Time Calculation (min): 56 min    Short Term Goals: Week 1:  OT Short Term Goal 1 (Week 1): STG=LTg  Skilled Therapeutic Interventions/Progress Updates:    Pt completed all bathing and dressing prior to visit at modified independent level for UB selfcare per report and supervision for LB, sitting on the bed with lateral leans for washing peri area and pulling pants over hips.  Next hand pt work on UE strengthening by propelling his wheelchair around the unit and down to the gift shop with modified independence and increased time secondary to needed rest breaks.  Pt with increased fatigue when pushing wheelchair.  Discussed donning and doffing of shrinker in the RLE at home vs ace wrapping.  Pt states his wife will do, and that he won't have to.  Will check with wife to be sure she feels OK with this during pm session.  Pt left in room with call button and phone in reach.    Therapy Documentation Precautions:  Precautions Precautions: Fall Precaution Comments: wound vac RLE Required Braces or Orthoses: Other Brace/Splint Other Brace/Splint: LLE prothestic Restrictions Weight Bearing Restrictions: Yes RLE Weight Bearing: Non weight bearing LLE Weight Bearing: Weight bearing as tolerated  Pain: Pain Assessment Pain Assessment: Faces Pain Score: 3  Faces Pain Scale: Hurts a little bit Pain Type: Surgical pain Pain Location: Leg Pain Orientation: Left Pain Descriptors / Indicators: Discomfort Pain Intervention(s): Repositioned ADL: See Function Navigator for Current Functional Status.   Therapy/Group: Individual Therapy  Columbia Pandey OTR/L 12/19/2017, 12:48 PM

## 2017-12-19 NOTE — Progress Notes (Signed)
Occupational Therapy Discharge Summary  Patient Details  Name: Thomas [name] MRN: [id number] Date of Birth: [birth year]  Today's Date: 12/19/2017 OT Individual Time: 1401-1445 OT Individual Time Calculation (min): 44 min   Session Note:  Pt worked on BUE strengthening exercises during session.  He completed 3 set of 5 repetitions for wheelchair pushups.  He also performed 2 sets of biceps curls for 15 reps using 10 lb dumbells.  Shoulder flexion was completed for 8 reps with 6 lb dumbbells bilaterally and shoulder press for 8 reps with min assist from therapist using 10 lb dumbbells.  Medium resistance green therapy band was used for triceps extension 20 reps and for shoulder extension/row for 1 set of 15 repetitions.  Finished session with pt return to the room with call button and phone in reach.   Patient has met 6 of 7 long term goals due to improved activity tolerance, improved balance and ability to compensate for deficits.  Patient to discharge at overall Supervision level.  Patient's care partner is independent to provide the necessary physical assistance at discharge.    Reasons goals not met: Pt cannot stand with min assist at this time during ADLs.  Currently performing selfcare tasks in sitting with lateral leans.    Recommendation:  Patient will benefit from ongoing skilled OT services in home health setting to continue to advance functional skills in the area of BADL.  Pt continues to demonstrate the need for supervision for functional transfers to the toilet and other surfaces.  He also demonstrates limited muscular endurance for selfcare tasks and wheelchair mobility.  Will benefit from initial [place] eval for safety and for increased strengthening and ADL independence work.    Equipment: drop arm commode  Reasons for discharge: treatment goals met and discharge from hospital  Patient/family agrees with progress made and goals achieved: Yes  OT Discharge Precautions/Restrictions   Precautions Precautions: Fall Other Brace/Splint: LLE prothestic Restrictions Weight Bearing Restrictions: Yes RLE Weight Bearing: Non weight bearing   Pain Pain Assessment Pain Assessment: Faces Faces Pain Scale: Hurts a little bit Pain Type: Surgical pain Pain Location: Leg Pain Orientation: Right Pain Descriptors / Indicators: Discomfort Pain Onset: On-going Pain Intervention(s): Repositioned ADL  See Function Section of chart for details  Vision Baseline Vision/History: No visual deficits Patient Visual Report: No change from baseline Vision Assessment?: No apparent visual deficits Perception  Perception: Within Functional Limits Praxis Praxis: Intact Cognition Overall Cognitive Status: Within Functional Limits for tasks assessed Arousal/Alertness: Awake/alert Orientation Level: Oriented X4 Attention: Sustained Sustained Attention: Appears intact Memory: Appears intact Awareness: Appears intact Safety/Judgment: Appears intact Sensation Sensation Light Touch: Appears Intact Stereognosis: Not tested Hot/Cold: Not tested Proprioception: Appears Intact Coordination Gross Motor Movements are Fluid and Coordinated: Yes Fine Motor Movements are Fluid and Coordinated: Yes Coordination and Movement Description: UE coordination [place] bilaterally Motor  Motor Motor: Within Functional Limits Mobility    See Function Section of chart for details  Trunk/Postural Assessment  Cervical Assessment Cervical Assessment: Within Functional Limits Thoracic Assessment Thoracic Assessment: Within Functional Limits Lumbar Assessment Lumbar Assessment: Exceptions to WFL(sits in posterior pelvic tilt )  Balance Balance Balance Assessed: Yes Static Sitting Balance Static Sitting - Level of Assistance: 6: Modified independent (Device/Increase time) Dynamic Sitting Balance Dynamic Sitting - Balance Support: During functional activity Dynamic Sitting - Level of Assistance: 5:  Stand by assistance Extremity/Trunk Assessment RUE Assessment RUE Assessment: Exceptions to [facility] WFLs, strength 4/5 throughout.  Decreased muscular endurance is noted.) LUE Assessment LUE Assessment: Exceptions  to [facility] WFLs, strength 4/5 throughout.  Decreased muscular endurance is noted)   See Function Navigator for Current Functional Status.  Malerie Eakins OTR/L 12/19/2017, 4:13 PM

## 2017-12-19 NOTE — Discharge Summary (Signed)
Thomas [name], Thomas [name]                  ACCOUNT NO.:  [id number]  MEDICAL RECORD NO.:  [id number]  LOCATION:                                 FACILITY:  PHYSICIAN:  Thomas Lesch, [name]        DATE OF BIRTH:  1966/06/16  DATE OF ADMISSION:  12/11/2017 DATE OF DISCHARGE:  12/20/2017                              DISCHARGE SUMMARY   DISCHARGE DIAGNOSES: 1. 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 2. Chronic Coumadin for history of supraventricular tachycardia. 3. Pain management. 4. End-stage renal disease, on hemodialysis. 5. Acute on chronic anemia. 6. Human immunodeficiency virus positive. 7. Diabetes mellitus with peripheral neuropathy. 8. Hypertension. 9. Diastolic congestive heart failure.  This is a 51 year old right-handed male with history of diabetes mellitus, end-stage renal disease, SVT (maintained on Coumadin), HIV positive, diastolic congestive heart failure, left below-knee amputation 3 years ago in [place].  He lives with his wife and mother-in-law. Used a left prosthesis prior to admission.  Presented on December 05, 2017, with nonhealing right foot wound.  X-rays and imaging of right foot showed multifocal marrow edema in the calcaneus and the distal 5 cm of the fibula, consistent with osteomyelitis.  Large skin ulceration along the lateral aspect.  Wound was not felt to be salvageable and underwent right BKA on December 06, 2017, per Dr. Sharol [name].  Application of wound VAC.  Hospital course, pain management, hemodialysis ongoing. Chronic anemia, 6.8, he was transfused.  His chronic Coumadin had since been resumed.  The patient was admitted for comprehensive rehab program.  PAST MEDICAL HISTORY:  See discharge diagnoses.  SOCIAL HISTORY:  Lives with spouse and mother-in-law.  Used a left prosthesis prior to admission.  FUNCTIONAL STATUS UPON ADMISSION TO REHAB SERVICES:  Minimal assist lateral scoot  transfers, moderate assist supine to sit, mod to max assist activities of daily living.  PHYSICAL EXAMINATION:  VITAL SIGNS:  Blood pressure 115/80, pulse 88, temperature 98, and respirations 19. GENERAL:  Alert male, in no acute distress. HEENT:  EOMs intact. NECK:  Supple.  Nontender.  No JVD. CARDIAC:  Rate controlled. ABDOMEN:  Soft, nontender.  Good bowel sounds. LUNGS:  Clear to auscultation. EXTREMITIES:  Initially with wound VAC in place to right BKA.  Old left BKA well healed.  Bilateral upper extremity strength 5/5.  REHABILITATION HOSPITAL COURSE:  The patient was admitted to Inpatient Rehab Services with therapies initiated on a 3-hour daily basis, consisting of physical therapy, occupational therapy, and rehabilitation nursing.  The following issues were addressed during the patient's rehabilitation stay.  Pertaining to Thomas [name]' right BKA on December 01, 2017, surgical site healing nicely, staples intact.  He would follow up with Orthopedic Services.  It was noted that during his rehabilitation stay he did have a fall that was assisted by Occupational Therapy.  He did bump his BKA site sustaining no damage to surgical site.  Left BKA site was well healed.  He did have a prosthesis.  He remained on chronic Coumadin for history of SVT.  No bleeding episodes.  Pain management,  use of oxycodone.  End-stage renal disease, hemodialysis as per Renal Services.  Acute on chronic anemia, latest hemoglobin 8.3, he remained on Aranesp.  He did have a history of HIV, continued on retroviral therapy.  Diabetes mellitus, peripheral neuropathy, hemoglobin A1c of 7.7.  His insulin was adjusted with full diabetic teaching.  Blood pressures remained controlled with Norvasc, lisinopril, and Lopressor. He exhibited no other signs of fluid overload.  The patient received weekly collaborative interdisciplinary team conferences to discuss estimated length of stay, family teaching, any  barriers to his discharge.  The patient could put on his pants supine supervision and setup supervision, transferred wheelchair scooting with supervision, boosted in wheelchair and assistance to pull up his pants.  He could put on his own prosthesis, propel his wheelchair with supervision. Activities of daily living and homemaking.  Completed transfers to drop- arm commode in the bed and activities daily living in apartment with supervision, minimal instructions.  He demonstrates a greater independence and confidence with transferring up to the higher bed toward the right side.  Full family teaching was completed and plan discharge to home.  DISCHARGE MEDICATIONS: 1. Norvasc 5 mg p.o. daily. 2. Aranesp weekly with dialysis. 3. Auryxia 420 mg p.o. t.i.d. 4. Lexiva 700 mg p.o. b.i.d. 5. Lantus insulin 30 units at bedtime. 6. Creon 12,000 units p.o. t.i.d. 7. Lisinopril 40 mg p.o. daily. 8. Lopressor 25 mg p.o. b.i.d. 9. Multivitamin daily. 10.FiberCon daily. 11.Isentress 400 mg p.o. b.i.d. 12.Norvir 100 mg p.o. daily. 13.Coumadin latest dose of 10 mg. Patient resume home dosing 10 mg Sunday Monday Wednesday Friday 7.5 mg other days 14.Oxycodone 10 mg p.o. every 3 hours as needed pain.  DIET:  His diet was a renal diet.  FOLLOWUP:  The patient would follow up with Thomas [name] at the Outpatient Rehab Service office as advised; Thomas [name] in 2 weeks, call for appointment; Thomas [name]. Thomas [name] cardiology services  SPECIAL INSTRUCTIONS:  Continue dialysis as advised.  The patient should receive weekly prothrombin times while maintained on chronic Coumadin with results to [place] [facility] [phone] fax [phone]. Patient's labs to be drawn while in dialysis     Thomas [name], P.A.   ______________________________ Thomas Lesch, [name]    DA/MEDQ  D:  12/18/2017  T:  12/18/2017  Job:  [id number]  cc:   Thomas [name] Thomas Lesch, [name] Thomas [name]

## 2017-12-19 NOTE — Discharge Instructions (Signed)
Inpatient Rehab Discharge Instructions  Thomas [name] Discharge date and time: No discharge date for patient encounter.   Activities/Precautions/ Functional Status: Activity: activity as tolerated Diet: renal diet Wound Care: keep wound clean and dry Functional status:  ___ No restrictions     ___ Walk up steps independently ___ 24/7 supervision/assistance   ___ Walk up steps with assistance ___ Intermittent supervision/assistance  ___ Bathe/dress independently ___ Walk with walker     __x_ Bathe/dress with assistance ___ Walk Independently    ___ Shower independently ___ Walk with assistance    ___ Shower with assistance ___ No alcohol     ___ Return to work/school ________  Special Instructions: Continue hemodialysis as directed  Patient should have weekly prothrombin times drawn while in dialysis with results to [facility] [phone] fax number [phone]   COMMUNITY REFERRALS UPON DISCHARGE:    Home Health:   [facility]   Date of last service:12/20/2017  Medical Equipment/Items Ordered:WHEELCHAIR, [place]   [phone]   GENERAL COMMUNITY RESOURCES FOR PATIENT/FAMILY: Support Groups:[facility] SUPPORT GROUP FIRST Tuesday @ 7:00 AT [place] [phone]  My questions have been answered and I understand these instructions. I will adhere to these goals and the provided educational materials after my discharge from the hospital.  Patient/Caregiver Signature _______________________________ Date __________  Clinician Signature _______________________________________ Date __________  Please bring this form and your medication list with you to all your follow-up doctor's appointments.

## 2017-12-19 NOTE — Progress Notes (Signed)
ANTICOAGULATION CONSULT NOTE - Follow-Up  Pharmacy Consult for warfarin Indication: atrial fibrillation  No Known Allergies  Vital Signs: Temp: 98.7 F (37.1 C) (12/21 0500) Temp Source: Oral (12/21 0500) BP: 112/76 (12/21 0500) Pulse Rate: 125 (12/21 0500)  Labs: Recent Labs    12/16/17 2045 12/17/17 0519 12/18/17 0628 12/18/17 1530 12/19/17 0509  HGB 7.9* 8.3*  --  8.0*  --   HCT 24.4* 27.1*  --  25.4*  --   PLT 314 299  --  308  --   LABPROT  --  22.4* 22.4*  --  20.5*  INR  --  1.99 1.98  --  1.77  CREATININE 12.41*  --   --  10.03*  --     Estimated Creatinine Clearance: 12.7 mL/min (A) (by C-G formula based on SCr of 10.03 mg/dL (H)).   Assessment: 72 YOM continue on warfarin for AFib. INR is down to 1.77 today, although has been on close to home dose recently. CBC low but stable and no bleeding noted at this time. Plan for discharge tomorrow.  PTA Home dose of Warfarin was 10mg  daily except 7.5mg  on TTSat  Goal of Therapy:  INR 2-3 Monitor platelets by anticoagulation protocol: Yes   Plan:  Warfarin 12.5 mg x 1 dose today Recommend continue home dose after discharge tomorrow. Monitor daily INR, CBC, clinical course, s/sx of bleed, PO intake, DDI   Maryanna Shape, PharmD, BCPS  Clinical Pharmacist  Pager: [phone]   12/19/2017 11:48 AM

## 2017-12-19 NOTE — Progress Notes (Signed)
[place] PHYSICAL MEDICINE & REHABILITATION     PROGRESS NOTE  Subjective/Complaints:  Patient seen lying in bed this morning. He states he slept well overnight. He is looking for to going home tomorrow.  ROS: Denies nausea, vomiting, diarrhea, shortness of breath or chest pain   Objective: Vital Signs: Blood pressure 112/76, pulse (!) 125, temperature 98.7 F (37.1 C), temperature source Oral, resp. rate 18, weight 119.8 kg (264 lb 1.8 oz), SpO2 99 %. No results found. Recent Labs    12/17/17 0519 12/18/17 1530  WBC 7.4 7.7  HGB 8.3* 8.0*  HCT 27.1* 25.4*  PLT 299 308   Recent Labs    12/16/17 2045 12/18/17 1530  NA 136 138  K 3.5 4.2  CL 95* 101  GLUCOSE 145* 76  BUN 81* 58*  CREATININE 12.41* 10.03*  CALCIUM 9.9 10.2   CBG (last 3)  Recent Labs    12/18/17 2003 12/19/17 0650 12/19/17 1118  GLUCAP 136* 104* 185*    Wt Readings from Last 3 Encounters:  12/18/17 119.8 kg (264 lb 1.8 oz)  12/11/17 116.7 kg (257 lb 4.4 oz)  12/05/17 129.3 kg (285 lb)    Physical Exam:  BP 112/76 (BP Location: Left Arm)   Pulse (!) 125 Comment: Pt was hot refused AC and door being open  Temp 98.7 F (37.1 C) (Oral)   Resp 18   Wt 119.8 kg (264 lb 1.8 oz)   SpO2 99%   BMI 30.52 kg/m  Constitutional: He appearswell-developed. NAD HENT: Normocephalic.  Atraumatic Eyes:EOMare normal. No discharge Cardiovascular: RRR. No JVD  Respiratory: CTA Bilaterally. Normal effort   CB:ULAGT sounds are normal. He exhibitsno distension. Neurological: He isalertand oriented. Motor: 5/5 in BUE, 5/5 Left HF RLE: HF: 4/5 (pain inhibition, stable).   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  Assessment/Plan: 1. 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 Physiatrist is providing close team supervision and 24  hour management of active medical problems listed below. Physiatrist and rehab team continue to assess barriers to discharge/monitor patient progress toward functional and medical goals.  Function:  Bathing Bathing position   Position: Shower  Bathing parts Body parts bathed by patient: Right arm, Left arm, Chest, Abdomen, Front perineal area, Right upper leg, Left upper leg, Buttocks, Back Body parts bathed by helper: Buttocks  Bathing assist Assist Level: Supervision or verbal cues      Upper Body Dressing/Undressing Upper body dressing   What is the patient wearing?: Pull over shirt/dress     Pull over shirt/dress - Perfomed by patient: Thread/unthread right sleeve, Thread/unthread left sleeve, Put head through opening, Pull shirt over trunk       Orthosis activity level: Performed by helper  Upper body assist Assist Level: Set up      Lower Body Dressing/Undressing Lower body dressing   What is the patient wearing?: Underwear, Pants(L LE prosthesis) Underwear - Performed by patient: Thread/unthread right underwear leg, Thread/unthread left underwear leg Underwear - Performed by helper: Pull underwear up/down Pants- Performed by patient: Thread/unthread right pants leg, Thread/unthread left pants leg Pants- Performed by helper: Pull pants up/down                      Lower body assist        Toileting Toileting Toileting activity did not occur:  No continent bowel/bladder event Toileting steps completed by patient: Adjust clothing prior to toileting, Performs perineal hygiene, Adjust clothing after toileting Toileting steps completed by helper: Adjust clothing prior to toileting, Performs perineal hygiene, Adjust clothing after toileting(per Linden Dolin, NT)    Toileting assist Assist level: Supervision or verbal cues   Transfers Chair/bed transfer   Chair/bed transfer method: Squat pivot Chair/bed transfer assist level: Supervision or verbal  cues Chair/bed transfer assistive device: Armrests     Locomotion Ambulation Ambulation activity did not occur: Safety/medical concerns(B BKA, poor standing with L prosthesis)         Wheelchair   Type: Manual Max wheelchair distance: 150 Assist Level: Supervision or verbal cues  Cognition Comprehension Comprehension assist level: Follows complex conversation/direction with no assist  Expression Expression assist level: Expresses complex ideas: With extra time/assistive device  Social Interaction Social Interaction assist level: Interacts appropriately with others - No medications needed.  Problem Solving Problem solving assist level: Solves complex problems: Recognizes & self-corrects  Memory Memory assist level: Complete Independence: No helper    Medical Problem List and Plan: 1.Decreased functional mobilitysecondary to right BKA 12/01/2017 as well as history of left BKA 3 years ago.Patient does have a left prosthesis   Continue CIR, plan for DC tomorrow 2. DVT Prophylaxis/Anticoagulation: Chronic Coumadin for history of SVT.Monitor for any bleeding episodes   INR subtherapeutic on 12/21 3. Pain Management:Oxycodone as needed 4. Mood:Provide emotional support 5. Neuropsych: This patientiscapable of making decisions on hisown behalf. 6. Skin/Wound Care:Routine skin checks 7. Fluids/Electrolytes/Nutrition:Routine I&O's  8.ESRD. Continue hemodialysis as per renal services 9.Acute on chronic anemia. Cont Aranesp   Hemoglobin 8.0 on 12/20 10.HIV.Continue current retroviral therapy 11.Diabetes mellitus with peripheralneuropathy. Hemoglobin A1c 7.7.    CBG (last 3)  Recent Labs    12/18/17 2003 12/19/17 0650 12/19/17 1118  GLUCAP 136* 104* 185*     Increased Lantus 30 units on 12/15   Increased mealtime to 6u tid 12/16   Elevated this a.m., otherwise relatively controlled on 12/21  12.HTN/SVT.   Norvasc 5mg  daily, lisinopril 40 mg daily, Lopressor 25 mg  twice a day.   Controlled on 12/21 13.Congestive heart failure: Monitor for any signs of fluid overload.  Regular weights per nephrology Filed Weights   12/17/17 0159 12/18/17 1523 12/18/17 1930  Weight: 119 kg (262 lb 5.6 oz) 122.8 kg (270 lb 11.6 oz) 119.8 kg (264 lb 1.8 oz)  14. Loose stools  Fibercon started on 12/17   Improving 15. Fall on stump  Cont to monitor for trauma 16. Morbid obesity.  Body mass index is 30.52 kg/m.  Diet and exercise education  Will cont to encourage weight loss to increase endurance and promote overall health   LOS (Days) 8 A FACE TO FACE EVALUATION WAS PERFORMED  Brytni Dray [name] 12/19/2017 12:19 PM

## 2017-12-19 NOTE — Progress Notes (Signed)
Physical Therapy Discharge Summary  Patient Details  Name: Thomas [name] MRN: [id number] Date of Birth: 01-Jun-1966  Today's Date: 12/19/2017 PT Individual Time: 1300-1400 PT Individual Time Calculation (min): 60 min   Skilled Therapeutic Interventions: Car transfer with sliding board and Supervision, v/c for safety. Squat pivot transfer to elevated mat to/from w/c Mod I to simulate home environment. Commode transfer Mod I. Manual w/c propulsion x 150 ft Mod I. Supine, side-lying, and prone RLE therex x 10-15 reps. Prone hip flexor stretch x 10 min. Pt reports no pain this PM.   Patient has met 4 of 6 long term goals due to improved activity tolerance, improved balance, improved postural control, increased strength, increased range of motion, decreased pain and improved coordination.  Patient to discharge at a wheelchair level Modified Independent.   Patient's care partner is independent to provide the necessary physical assistance at discharge.   Reasons goals not met: Pt did not meet dynamic standing balance goal as he needs Min Assist to maintain standing balance. Pt did not meet ambulation goal as he is not steady enough in LLE prosthesis to ambulate at this time.   Recommendation:  Patient will benefit from ongoing skilled PT services in home health setting to continue to advance safe functional mobility, address ongoing impairments in LE strength and standing balance, and minimize fall risk.  Equipment: Wheelchair, Sliding Board  Reasons for discharge: treatment goals met and discharge from hospital  Patient/family agrees with progress made and goals achieved: Yes  PT Discharge Precautions/Restrictions Precautions Precautions: Fall Restrictions Weight Bearing Restrictions: Yes RLE Weight Bearing: Non weight bearing LLE Weight Bearing: Weight bearing as tolerated Vision/Perception  Perception Perception: Within Functional Limits Praxis Praxis: Intact  Cognition Overall  Cognitive Status: Within Functional Limits for tasks assessed Arousal/Alertness: Awake/alert Orientation Level: Oriented X4 Attention: Sustained Sustained Attention: Appears intact Memory: Appears intact Awareness: Appears intact Safety/Judgment: Appears intact Sensation Sensation Light Touch: Appears Intact Stereognosis: Not tested Hot/Cold: Not tested Proprioception: Appears Intact Coordination Gross Motor Movements are Fluid and Coordinated: Yes Fine Motor Movements are Fluid and Coordinated: Yes Coordination and Movement Description: UE coordination [place] bilaterally Motor  Motor Motor: Within Functional Limits     Trunk/Postural Assessment  Cervical Assessment Cervical Assessment: Within Functional Limits Thoracic Assessment Thoracic Assessment: Within Functional Limits Lumbar Assessment Lumbar Assessment: Exceptions to WFL(sits in posterior pelvic tilt )  Balance Balance Balance Assessed: Yes Static Sitting Balance Static Sitting - Level of Assistance: 6: Modified independent (Device/Increase time) Dynamic Sitting Balance Dynamic Sitting - Balance Support: During functional activity Dynamic Sitting - Level of Assistance: 6: Modified independent (Device/Increase time) Static Standing Balance Static Standing - Level of Assistance: 4: Min assist Extremity Assessment  RUE Assessment RUE Assessment: Exceptions to [facility] WFLs, strength 4/5 throughout.  Decreased muscular endurance is noted.) LUE Assessment LUE Assessment: Exceptions to [facility] WFLs, strength 4/5 throughout.  Decreased muscular endurance is noted) RLE Assessment RLE Assessment: Exceptions to WFL(R BKA, full AROM of limb, 4/5 MMT) LLE Assessment LLE Assessment: Exceptions to WFL(L BKA, full AROM of limb, 4/5 MMT)   See Function Navigator for Current Functional Status.  Excell Seltzer, PT, DPT  12/19/2017, 5:07 PM

## 2017-12-20 LAB — RENAL FUNCTION PANEL
ANION GAP: 12 (ref 5–15)
Albumin: 2.8 g/dL — ABNORMAL LOW (ref 3.5–5.0)
BUN: 45 mg/dL — ABNORMAL HIGH (ref 6–20)
CHLORIDE: 98 mmol/L — AB (ref 101–111)
CO2: 26 mmol/L (ref 22–32)
Calcium: 10.6 mg/dL — ABNORMAL HIGH (ref 8.9–10.3)
Creatinine, Ser: 8.72 mg/dL — ABNORMAL HIGH (ref 0.61–1.24)
GFR calc non Af Amer: 6 mL/min — ABNORMAL LOW (ref >60)
GFR, EST AFRICAN AMERICAN: 7 mL/min — AB (ref >60)
GLUCOSE: 125 mg/dL — AB (ref 65–99)
Phosphorus: 7.2 mg/dL — ABNORMAL HIGH (ref 2.5–4.6)
Potassium: 4 mmol/L (ref 3.5–5.1)
Sodium: 136 mmol/L (ref 135–145)

## 2017-12-20 LAB — CBC
HCT: 27.1 % — ABNORMAL LOW (ref 39.0–52.0)
HEMOGLOBIN: 8.4 g/dL — AB (ref 13.0–17.0)
MCH: 28.4 pg (ref 26.0–34.0)
MCHC: 31 g/dL (ref 30.0–36.0)
MCV: 91.6 fL (ref 78.0–100.0)
PLATELETS: 282 10*3/uL (ref 150–400)
RBC: 2.96 MIL/uL — AB (ref 4.22–5.81)
RDW: 19.9 % — ABNORMAL HIGH (ref 11.5–15.5)
WBC: 7.9 10*3/uL (ref 4.0–10.5)

## 2017-12-20 LAB — GLUCOSE, CAPILLARY
Glucose-Capillary: 123 mg/dL — ABNORMAL HIGH (ref 65–99)
Glucose-Capillary: 133 mg/dL — ABNORMAL HIGH (ref 65–99)

## 2017-12-20 LAB — PROTIME-INR
INR: 1.99
PROTHROMBIN TIME: 22.4 s — AB (ref 11.4–15.2)

## 2017-12-20 MED ORDER — WARFARIN SODIUM 7.5 MG PO TABS: 7.5000 mg | ORAL_TABLET | Freq: Once | ORAL | Status: DC

## 2017-12-20 MED ORDER — DARBEPOETIN ALFA 200 MCG/0.4ML IJ SOSY
PREFILLED_SYRINGE | INTRAMUSCULAR | Status: AC
  Administered 2017-12-20: 200 ug via INTRAVENOUS
  Filled 2017-12-20: qty 0.4

## 2017-12-20 MED ORDER — WARFARIN SODIUM 5 MG PO TABS: 10.0000 mg | ORAL_TABLET | Freq: Once | ORAL | Status: DC

## 2017-12-20 MED ORDER — OXYCODONE HCL 5 MG PO TABS
ORAL_TABLET | ORAL | Status: AC
  Filled 2017-12-20: qty 2

## 2017-12-20 NOTE — Progress Notes (Signed)
PA D. [place] reviewed discharge instructions with pt and wife previously. Pt and wife with no further questions or concerns at this time. Pt escorted in wheelchair to the ground floor by NT Alycia @1330  for discharge.

## 2017-12-20 NOTE — Progress Notes (Signed)
[place] PHYSICAL MEDICINE & REHABILITATION     PROGRESS NOTE  Subjective/Complaints:  Patient seen lying in bed this morning. He states he slept well overnight. He is ready for discharge.  ROS: Denies nausea, vomiting, diarrhea, shortness of breath or chest pain   Objective: Vital Signs: Blood pressure 118/76, pulse 86, temperature 98.3 F (36.8 C), temperature source Oral, resp. rate 18, weight 118.4 kg (261 lb 0.4 oz), SpO2 100 %. No results found. Recent Labs    12/18/17 1530 12/20/17 0734  WBC 7.7 7.9  HGB 8.0* 8.4*  HCT 25.4* 27.1*  PLT 308 282   Recent Labs    12/18/17 1530 12/20/17 0734  NA 138 136  K 4.2 4.0  CL 101 98*  GLUCOSE 76 125*  BUN 58* 45*  CREATININE 10.03* 8.72*  CALCIUM 10.2 10.6*   CBG (last 3)  Recent Labs    12/19/17 1627 12/19/17 2053 12/20/17 0641  GLUCAP 99 169* 123*    Wt Readings from Last 3 Encounters:  12/20/17 118.4 kg (261 lb 0.4 oz)  12/11/17 116.7 kg (257 lb 4.4 oz)  12/05/17 129.3 kg (285 lb)    Physical Exam:  BP 118/76   Pulse 86   Temp 98.3 F (36.8 C) (Oral)   Resp 18   Wt 118.4 kg (261 lb 0.4 oz)   SpO2 100%   BMI 30.16 kg/m  Constitutional: He appearswell-developed. NAD HENT: Normocephalic.  Atraumatic Eyes:EOMare normal. No discharge Cardiovascular: RRR. No JVD  Respiratory: CTA Bilaterally. Normal effort   KV:QQVZD sounds are normal. He exhibitsno distension. Neurological: He isalertand oriented. Motor: 5/5 in BUE, 5/5 Left HF RLE: HF: 4/5 (pain inhibition, slightly improved).   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  Assessment/Plan: 1. 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 Physiatrist is providing close team supervision and 24 hour management of active medical problems listed below. Physiatrist and rehab team  continue to assess barriers to discharge/monitor patient progress toward functional and medical goals.  Function:  Bathing Bathing position   Position: Sitting EOB  Bathing parts Body parts bathed by patient: Right arm, Left arm, Chest, Abdomen, Front perineal area, Right upper leg, Left upper leg, Buttocks, Back Body parts bathed by helper: Buttocks  Bathing assist Assist Level: Supervision or verbal cues      Upper Body Dressing/Undressing Upper body dressing   What is the patient wearing?: Pull over shirt/dress     Pull over shirt/dress - Perfomed by patient: Thread/unthread right sleeve, Thread/unthread left sleeve, Put head through opening, Pull shirt over trunk       Orthosis activity level: Performed by helper  Upper body assist Assist Level: More than reasonable time      Lower Body Dressing/Undressing Lower body dressing   What is the patient wearing?: Underwear, Pants, Shoes Underwear - Performed by patient: Thread/unthread right underwear leg, Thread/unthread left underwear leg, Pull underwear up/down Underwear - Performed by helper: Pull underwear up/down Pants- Performed by patient: Thread/unthread right pants leg, Thread/unthread left pants leg, Pull pants up/down Pants- Performed by helper: Pull pants up/down         Shoes - Performed by patient: Don/doff left shoe(with prosthesis)            Lower body assist Assist for lower body dressing: Supervision or verbal cues      Toileting Toileting  Toileting activity did not occur: No continent bowel/bladder event Toileting steps completed by patient: Adjust clothing prior to toileting, Performs perineal hygiene, Adjust clothing after toileting Toileting steps completed by helper: Adjust clothing prior to toileting, Performs perineal hygiene, Adjust clothing after toileting(per Linden Dolin, NT)    Toileting assist Assist level: Supervision or verbal cues   Transfers Chair/bed transfer   Chair/bed  transfer method: Squat pivot Chair/bed transfer assist level: No Help, no cues, assistive device, takes more than a reasonable amount of time Chair/bed transfer assistive device: Armrests     Locomotion Ambulation Ambulation activity did not occur: Safety/medical concerns(B BKA, poor standing tolerance on L prosthesis)         Wheelchair   Type: Manual Max wheelchair distance: 150 ft Assist Level: No help, No cues, assistive device, takes more than reasonable amount of time  Cognition Comprehension Comprehension assist level: Follows complex conversation/direction with no assist  Expression Expression assist level: Expresses complex ideas: With extra time/assistive device  Social Interaction Social Interaction assist level: Interacts appropriately with others - No medications needed.  Problem Solving Problem solving assist level: Solves complex problems: Recognizes & self-corrects  Memory Memory assist level: Complete Independence: No helper    Medical Problem List and Plan: 1.Decreased functional mobilitysecondary to right BKA 12/01/2017 as well as history of left BKA 3 years ago.Patient does have a left prosthesis   D/c today  Will see patient for transitional care management in 1-2 weeks 2. DVT Prophylaxis/Anticoagulation: Chronic Coumadin for history of SVT.Monitor for any bleeding episodes   INR subtherapeutic on 12/22 3. Pain Management:Oxycodone as needed 4. Mood:Provide emotional support 5. Neuropsych: This patientiscapable of making decisions on hisown behalf. 6. Skin/Wound Care:Routine skin checks 7. Fluids/Electrolytes/Nutrition:Routine I&O's  8.ESRD. Continue hemodialysis as per renal services 9.Acute on chronic anemia. Cont Aranesp   Hemoglobin 8.4 on 12/22 10.HIV.Continue current retroviral therapy 11.Diabetes mellitus with peripheralneuropathy. Hemoglobin A1c 7.7.    CBG (last 3)  Recent Labs    12/19/17 1627 12/19/17 2053 12/20/17 0641   GLUCAP 99 169* 123*     Increased Lantus 30 units on 12/15   Increased mealtime to 6u tid 12/16   Relatively controlled on 12/22  12.HTN/SVT.   Norvasc 5mg  daily, lisinopril 40 mg daily, Lopressor 25 mg twice a day.   Controlled on 12/22 13.Congestive heart failure: Monitor for any signs of fluid overload.  Regular weights per nephrology Filed Weights   12/18/17 1930 12/20/17 0513 12/20/17 0716  Weight: 119.8 kg (264 lb 1.8 oz) 119.3 kg (263 lb 0.1 oz) 118.4 kg (261 lb 0.4 oz)  14. Loose stools  Fibercon started on 12/17   Improving 15. Fall on stump  Cont to monitor for trauma 16. Morbid obesity.  Body mass index is 30.16 kg/m.  Diet and exercise education  Will cont to encourage weight loss to increase endurance and promote overall health   LOS (Days) 9 A FACE TO FACE EVALUATION WAS PERFORMED  Ankit [name] 12/20/2017 8:39 AM

## 2017-12-20 NOTE — Procedures (Signed)
I was present at this dialysis session. I have reviewed the session itself and made appropriate changes.   For DC today after HD.  3K bath, UF goal 3L. AVF.  No issues. He is aware of HD schedule for Christmas, next HD 12/24, then 12/27.   Filed Weights   12/18/17 1523 12/18/17 1930 12/20/17 0513  Weight: 122.8 kg (270 lb 11.6 oz) 119.8 kg (264 lb 1.8 oz) 119.3 kg (263 lb 0.1 oz)    Recent Labs  Lab 12/20/17 0734  NA 136  K 4.0  CL 98*  CO2 26  GLUCOSE 125*  BUN 45*  CREATININE 8.72*  CALCIUM 10.6*  PHOS 7.2*    Recent Labs  Lab 12/17/17 0519 12/18/17 1530 12/20/17 0734  WBC 7.4 7.7 7.9  HGB 8.3* 8.0* 8.4*  HCT 27.1* 25.4* 27.1*  MCV 91.9 92.0 91.6  PLT 299 308 282    Scheduled Meds: . oxyCODONE      . amLODipine  5 mg Oral QPM  . darbepoetin (ARANESP) injection - DIALYSIS  200 mcg Intravenous Q Sat-HD  . doxercalciferol  5 mcg Intravenous Q T,Th,Sa-HD  . feeding supplement (NEPRO CARB STEADY)  237 mL Oral BID BM  . feeding supplement (PRO-STAT SUGAR FREE 64)  30 mL Oral BID  . ferric citrate  420 mg Oral TID WC  . fosamprenavir  700 mg Oral BID  . insulin aspart  0-20 Units Subcutaneous TID WC  . insulin aspart  6 Units Subcutaneous TID WC  . insulin glargine  30 Units Subcutaneous QHS  . lipase/protease/amylase  12,000 Units Oral TID AC  . lisinopril  40 mg Oral Daily  . metoprolol tartrate  25 mg Oral BID  . multivitamin  1 tablet Oral QHS  . polycarbophil  625 mg Oral Daily  . raltegravir  400 mg Oral BID  . ritonavir  100 mg Oral Q breakfast  . warfarin  7.5 mg Oral ONCE-1800  . Warfarin - Pharmacist Dosing Inpatient   Does not apply q1800   Continuous Infusions: . ferric gluconate (FERRLECIT/NULECIT) IV Stopped (12/18/17 1857)   PRN Meds:.acetaminophen **OR** acetaminophen, camphor-menthol, diphenhydrAMINE, lip balm, ondansetron **OR** ondansetron (ZOFRAN) IV, oxyCODONE, sorbitol   Thomas Grippe  MD 12/20/2017, 8:26 AM

## 2017-12-20 NOTE — Progress Notes (Addendum)
ANTICOAGULATION CONSULT NOTE - Follow-Up  Pharmacy Consult for warfarin Indication: atrial fibrillation  No Known Allergies  Vital Signs: Temp: 98.5 F (36.9 C) (12/22 0513) Temp Source: Oral (12/22 0513) BP: 135/81 (12/22 0513) Pulse Rate: 90 (12/22 0513)  Labs: Recent Labs    12/18/17 0628 12/18/17 1530 12/19/17 0509 12/20/17 0607  HGB  --  8.0*  --   --   HCT  --  25.4*  --   --   PLT  --  308  --   --   LABPROT 22.4*  --  20.5* 22.4*  INR 1.98  --  1.77 1.99  CREATININE  --  10.03*  --   --     Estimated Creatinine Clearance: 12.6 mL/min (A) (by C-G formula based on SCr of 10.03 mg/dL (H)).   Assessment: 50 YOM on warfarin for AFib. INR is 1.99 today. 12/20 CBC low but stable and no bleeding noted at this time. Plan for discharge today.  PTA Home dose of Warfarin is 10mg  daily except 7.5mg  on TTSat  Goal of Therapy:  INR 2-3 Monitor platelets by anticoagulation protocol: Yes   Plan:  Depending on time of discharge, coumadin 7.5mg  x1 today  Recommend continue home dose after discharge  Monitor daily INR, CBC, clinical course, s/sx of bleed as needed   Diana L. Kyung Rudd, PharmD, [place] PGY1 Pharmacy Resident Pager: [phone]

## 2018-01-02 ENCOUNTER — Ambulatory Visit (INDEPENDENT_AMBULATORY_CARE_PROVIDER_SITE_OTHER): Payer: Medicare Other | Admitting: Family

## 2018-01-02 ENCOUNTER — Encounter (INDEPENDENT_AMBULATORY_CARE_PROVIDER_SITE_OTHER): Payer: Self-pay | Admitting: Family

## 2018-01-02 DIAGNOSIS — E1152 Type 2 diabetes mellitus with diabetic peripheral angiopathy with gangrene: Secondary | ICD-10-CM

## 2018-01-02 DIAGNOSIS — Z89512 Acquired absence of left leg below knee: Secondary | ICD-10-CM

## 2018-01-02 DIAGNOSIS — Z89511 Acquired absence of right leg below knee: Secondary | ICD-10-CM

## 2018-01-02 MED ORDER — SILVER SULFADIAZINE 1 % EX CREA: 1.0000 "application " | TOPICAL_CREAM | Freq: Every day | CUTANEOUS | 0 refills | Status: DC

## 2018-01-02 NOTE — Progress Notes (Signed)
Post-Op Visit Note   Patient: Thomas [name]           Date of Birth: 17-Apr-1966           MRN: [id number] Visit Date: 01/02/2018 PCP: Patient, No Pcp Per  Chief Complaint:  Chief Complaint  Patient presents with  . Right Leg - Routine Post Op    12/06/17 R BKA    HPI:  HPI The patient is a 52 year old gentleman seen status post right BKA on 12/06/17. Is s/p L BKA 3 years prior. States has had a fall resulting in ulceration to right BKA. Ortho Exam Eschar along incision. Some blistering to tip. No dehiscence. No drainage or erythema. Staples in place. No sign of infection.   Visit Diagnoses:  1. Diabetic wet gangrene of the foot ([place])   2. S/P bilateral BKA (below knee amputation) ([place])     Plan: daily incisional cleansing and silvadene dressing changes. Given order for prosthesis set up to [place]. Begin wearing shrinker once obtained.   Follow-Up Instructions: Return in about 2 weeks (around 01/16/2018).   Imaging: No results found.  Orders:  No orders of the defined types were placed in this encounter.  Meds ordered this encounter  Medications  . silver sulfADIAZINE (SILVADENE) 1 % cream    Sig: Apply 1 application topically daily.    Dispense:  50 g    Refill:  0     PMFS History: Patient Active Problem List   Diagnosis Date Noted  . Morbid obesity ([place])   . Fall   . Loose stools   . Benign essential HTN   . Subtherapeutic international normalized ratio (INR)   . History of left below knee amputation ([place]) 12/11/2017  . Hypoxia   . History of supraventricular tachycardia   . Chronic diastolic congestive heart failure ([place])   . S/P bilateral BKA (below knee amputation) ([place])   . Acute blood loss anemia   . Anemia of chronic disease   . Leukocytosis   . Post-operative pain   . Non-ischemic cardiomyopathy ([place])   . Cutaneous abscess of right foot   . Hypokalemia 09/04/2017  . Essential hypertension 09/03/2017  . GERD (gastroesophageal reflux disease)  09/03/2017  . Diabetic foot ulcer ([place]) 09/03/2017  . GIB (gastrointestinal bleeding) 07/26/2017  . Anemia due to end stage renal disease ([place]) 07/26/2017  . PAF (paroxysmal atrial fibrillation) ([place]) 07/26/2017  . Symptomatic anemia 07/26/2017  . Fluid overload 05/05/2017  . Elevated troponin 05/05/2017  . Pulmonary edema 05/05/2017  . ESRD (end stage renal disease) ([place]) 05/05/2017  . HIV disease ([place]) 03/25/2017  . Hypertensive heart disease with end stage renal disease on dialysis ([place]) 03/13/2017  . ESRD on dialysis ([place]) 03/13/2017  . Type 2 diabetes mellitus with complication ([place]) 24/23/5361  . SVT (supraventricular tachycardia) (HCC)    Past Medical History:  Diagnosis Date  . Anemia   . Chronic combined systolic and diastolic CHF (congestive heart failure) ([place])   . Diabetes ([place])   . Diabetic wet gangrene of the foot ([place]) 09/04/2017  . ESRD (end stage renal disease) [facility])    H[facility] [address] T, Th, Sat  . HIV disease ([place])   . Hyperparathyroidism, secondary ([place])   . Hypertension   . Hypertensive heart disease with end stage renal disease on dialysis ([place]) 03/13/2017  . Subacute osteomyelitis, right ankle and foot ([place])   . SVT (supraventricular tachycardia) ([place])    ? afib or atrial flutter s/p  TEE/DCCV with subsequent ablation due to reoccurrence in [place]  . Type 2 diabetes mellitus (HCC)     Family History  Problem Relation Age of Onset  . Heart disease Mother   . Diabetes Mother   . Heart disease Father   . Cancer Neg Hx     Past Surgical History:  Procedure Laterality Date  . AMPUTATION Left    foot  . AMPUTATION Right 12/06/2017   Procedure: AMPUTATION BELOW KNEE;  Surgeon: [name], MD;  Location: [place];  Service: Orthopedics;  Laterality: Right;  . APPLICATION OF WOUND VAC Right 09/04/2017   Procedure: APPLICATION OF WOUND VAC;  Surgeon: [name], DPM;  Location: [place];  Service: Podiatry;  Laterality: Right;  . APPLICATION OF WOUND VAC   12/06/2017   Procedure: APPLICATION OF WOUND VAC;  Surgeon: [name], MD;  Location: [place];  Service: Orthopedics;;  . COLONOSCOPY WITH PROPOFOL N/A 07/30/2017   Procedure: COLONOSCOPY WITH PROPOFOL;  Surgeon: [name], MD;  Location: [place];  Service: Endoscopy;  Laterality: N/A;  . ESOPHAGOGASTRODUODENOSCOPY (EGD) WITH PROPOFOL N/A 07/28/2017   Procedure: ESOPHAGOGASTRODUODENOSCOPY (EGD) WITH PROPOFOL;  Surgeon: [name], MD;  Location: [place];  Service: Endoscopy;  Laterality: N/A;  . FLEXIBLE SIGMOIDOSCOPY N/A 07/27/2017   Procedure: FLEXIBLE SIGMOIDOSCOPY;  Surgeon: [name], MD;  Location[facility] END[facility]COPY;  Service: Endoscopy;  Laterality: N/A;  . GRAFT APPLICATION Right 48/12/6551   Procedure: SKIN GRAFT APPLICATION RIGHT FOOT;  Surgeon: [name], DPM;  Location: [place];  Service: Podiatry;  Laterality: Right;  . I&D EXTREMITY Right 09/04/2017   Procedure: IRRIGATION AND DEBRIDEMENT EXTREMITY;  Surgeon: [name], DPM;  Location: [place];  Service: Podiatry;  Laterality: Right;  . I&D EXTREMITY Right 11/12/2017   Procedure: IRRIGATION AND DEBRIDEMENT ULCER RIGHT FOOT;  Surgeon: [name], DPM;  Location: [place];  Service: Podiatry;  Laterality: Right;  . RIGHT AV FISTULA PLACEMENT  10/19/2012  . WOUND DEBRIDEMENT N/A 09/24/2017   Procedure: DEBRIDEMENT WOUND;  Surgeon: [name], DPM;  Location: [place];  Service: Podiatry;  Laterality: N/A;   Social History   Occupational History  . Occupation: disabled  Tobacco Use  . Smoking status: Never Smoker  . Smokeless tobacco: Never Used  Substance and Sexual Activity  . Alcohol use: No  . Drug use: No  . Sexual activity: Not on file

## 2018-01-09 ENCOUNTER — Encounter: Payer: Self-pay | Admitting: Physical Medicine & Rehabilitation

## 2018-01-09 ENCOUNTER — Encounter: Payer: Medicare Other | Attending: Physical Medicine & Rehabilitation | Admitting: Physical Medicine & Rehabilitation

## 2018-01-09 VITALS — BP 128/79 | HR 74

## 2018-01-09 DIAGNOSIS — E1142 Type 2 diabetes mellitus with diabetic polyneuropathy: Secondary | ICD-10-CM | POA: Insufficient documentation

## 2018-01-09 DIAGNOSIS — I471 Supraventricular tachycardia: Secondary | ICD-10-CM | POA: Insufficient documentation

## 2018-01-09 DIAGNOSIS — Z89511 Acquired absence of right leg below knee: Secondary | ICD-10-CM | POA: Insufficient documentation

## 2018-01-09 DIAGNOSIS — N186 End stage renal disease: Secondary | ICD-10-CM | POA: Diagnosis not present

## 2018-01-09 DIAGNOSIS — I132 Hypertensive heart and chronic kidney disease with heart failure and with stage 5 chronic kidney disease, or end stage renal disease: Secondary | ICD-10-CM | POA: Insufficient documentation

## 2018-01-09 DIAGNOSIS — Z8679 Personal history of other diseases of the circulatory system: Secondary | ICD-10-CM

## 2018-01-09 DIAGNOSIS — E1122 Type 2 diabetes mellitus with diabetic chronic kidney disease: Secondary | ICD-10-CM | POA: Diagnosis not present

## 2018-01-09 DIAGNOSIS — N2581 Secondary hyperparathyroidism of renal origin: Secondary | ICD-10-CM | POA: Insufficient documentation

## 2018-01-09 DIAGNOSIS — E119 Type 2 diabetes mellitus without complications: Secondary | ICD-10-CM | POA: Diagnosis present

## 2018-01-09 DIAGNOSIS — E118 Type 2 diabetes mellitus with unspecified complications: Secondary | ICD-10-CM

## 2018-01-09 DIAGNOSIS — Z794 Long term (current) use of insulin: Secondary | ICD-10-CM | POA: Diagnosis not present

## 2018-01-09 DIAGNOSIS — R269 Unspecified abnormalities of gait and mobility: Secondary | ICD-10-CM | POA: Insufficient documentation

## 2018-01-09 DIAGNOSIS — Z79899 Other long term (current) drug therapy: Secondary | ICD-10-CM | POA: Insufficient documentation

## 2018-01-09 DIAGNOSIS — Z8249 Family history of ischemic heart disease and other diseases of the circulatory system: Secondary | ICD-10-CM | POA: Diagnosis not present

## 2018-01-09 DIAGNOSIS — Z89512 Acquired absence of left leg below knee: Secondary | ICD-10-CM | POA: Diagnosis not present

## 2018-01-09 DIAGNOSIS — Z7901 Long term (current) use of anticoagulants: Secondary | ICD-10-CM | POA: Insufficient documentation

## 2018-01-09 DIAGNOSIS — Z992 Dependence on renal dialysis: Secondary | ICD-10-CM

## 2018-01-09 DIAGNOSIS — I10 Essential (primary) hypertension: Secondary | ICD-10-CM | POA: Diagnosis not present

## 2018-01-09 DIAGNOSIS — Z21 Asymptomatic human immunodeficiency virus [HIV] infection status: Secondary | ICD-10-CM | POA: Insufficient documentation

## 2018-01-09 DIAGNOSIS — G8918 Other acute postprocedural pain: Secondary | ICD-10-CM

## 2018-01-09 DIAGNOSIS — I5042 Chronic combined systolic (congestive) and diastolic (congestive) heart failure: Secondary | ICD-10-CM | POA: Insufficient documentation

## 2018-01-09 DIAGNOSIS — B20 Human immunodeficiency virus [HIV] disease: Secondary | ICD-10-CM | POA: Diagnosis not present

## 2018-01-09 DIAGNOSIS — Z833 Family history of diabetes mellitus: Secondary | ICD-10-CM | POA: Diagnosis not present

## 2018-01-09 MED ORDER — GABAPENTIN 100 MG PO CAPS: 100.0000 mg | ORAL_CAPSULE | ORAL | 1 refills | Status: AC | PRN

## 2018-01-09 NOTE — Progress Notes (Signed)
Swab Drug Screen cancelled by provider, states will not be prescribing opioid medication to this patient.

## 2018-01-09 NOTE — Progress Notes (Signed)
Subjective:    Patient ID: Thomas [name], male    DOB: [birth year], 52 y.o.   MRN: [id number]  HPI 52 year old right-handed male with history of diabetes mellitus, end-stage renal disease, SVT (maintained on Coumadin), HIV positive, diastolic congestive heart failure, left below-knee amputation presents for hospital follow up after receiving CIR for left BKA.  DATE OF ADMISSION:  12/11/2017 DATE OF DISCHARGE:  12/20/2017  At discharge he was instructed to follow up with Ortho, whom he saw.  He goes back later this month. He sees Cards this month.  He has not seen his PCP. Pt states INR is WNL. Pain is intermittent, worse at the end of the day.  He states his CBGs have been in the 200s.  His BP is controlled. Denies falls.  DME: Bedside commode, sliding board. Mobility: Wheelchair at all times.  Therapies: Pt states he has not had follow up.  Pain Inventory Average Pain 6 Pain Right Now 0 My pain is stabbing  In the last 24 hours, has pain interfered with the following? General activity 4 Relation with others 0 Enjoyment of life 5 What TIME of day is your pain at its worst? night Sleep (in general) Poor  Pain is worse with: some activites Pain improves with: medication Relief from Meds: 6  Mobility ability to climb steps?  no do you drive?  no use a wheelchair needs help with transfers  Function disabled: date disabled . I need assistance with the following:  bathing, toileting, meal prep, household duties and shopping  Neuro/Psych trouble walking  Prior Studies Any changes since last visit?  no  Physicians involved in your care Any changes since last visit?  no   Family History  Problem Relation Age of Onset  . Heart disease Mother   . Diabetes Mother   . Heart disease Father   . Cancer Neg Hx    Social History   Socioeconomic History  . Marital status: Married    Spouse name: Thomas [name]  . Number of children: 3  . Years of education: college  . Highest  education level: None  Social Needs  . Financial resource strain: None  . Food insecurity - worry: None  . Food insecurity - inability: None  . Transportation needs - medical: None  . Transportation needs - non-medical: None  Occupational History  . Occupation: disabled  Tobacco Use  . Smoking status: Never Smoker  . Smokeless tobacco: Never Used  Substance and Sexual Activity  . Alcohol use: No  . Drug use: No  . Sexual activity: None  Other Topics Concern  . None  Social History Narrative  . None   Past Surgical History:  Procedure Laterality Date  . AMPUTATION Left    foot  . AMPUTATION Right 12/06/2017   Procedure: AMPUTATION BELOW KNEE;  Surgeon: [name], MD;  Location: [place];  Service: Orthopedics;  Laterality: Right;  . APPLICATION OF WOUND VAC Right 09/04/2017   Procedure: APPLICATION OF WOUND VAC;  Surgeon: [name], DPM;  Location: [place];  Service: Podiatry;  Laterality: Right;  . APPLICATION OF WOUND VAC  12/06/2017   Procedure: APPLICATION OF WOUND VAC;  Surgeon: [name], MD;  Location: [place];  Service: Orthopedics;;  . COLONOSCOPY WITH PROPOFOL N/A 07/30/2017   Procedure: COLONOSCOPY WITH PROPOFOL;  Surgeon: [name], MD;  Location: [place];  Service: Endoscopy;  Laterality: N/A;  . ESOPHAGOGASTRODUODENOSCOPY (EGD) WITH PROPOFOL N/A 07/28/2017   Procedure: ESOPHAGOGASTRODUODENOSCOPY (EGD) WITH PROPOFOL;  Surgeon: [name], MD;  Location: [facility] ENDOSCOPY;  Service: Endoscopy;  Laterality: N/A;  . FLEXIBLE SIGMOIDOSCOPY N/A 07/27/2017   Procedure: FLEXIBLE SIGMOIDOSCOPY;  Surgeon: [name], MD;  Location: [facility] ENDOSCOPY;  Service: Endoscopy;  Laterality: N/A;  . GRAFT APPLICATION Right 60/09/9322   Procedure: SKIN GRAFT APPLICATION RIGHT FOOT;  Surgeon: [name], DPM;  Location: [place];  Service: Podiatry;  Laterality: Right;  . I&D EXTREMITY Right 09/04/2017   Procedure: IRRIGATION AND DEBRIDEMENT EXTREMITY;  Surgeon: [name], DPM;  Location: [place];  Service: Podiatry;  Laterality: Right;  . I&D EXTREMITY Right 11/12/2017   Procedure: IRRIGATION AND DEBRIDEMENT ULCER RIGHT FOOT;  Surgeon: [name], DPM;  Location: [place];  Service: Podiatry;  Laterality: Right;  . RIGHT AV FISTULA PLACEMENT  10/19/2012  . WOUND DEBRIDEMENT N/A 09/24/2017   Procedure: DEBRIDEMENT WOUND;  Surgeon: [name], DPM;  Location: [place];  Service: Podiatry;  Laterality: N/A;   Past Medical History:  Diagnosis Date  . Anemia   . Chronic combined systolic and diastolic CHF (congestive heart failure) ([place])   . Diabetes ([place])   . Diabetic wet gangrene of the foot ([place]) 09/04/2017  . ESRD (end stage renal disease) [facility])    [place] [address] T, Th, Sat  . HIV disease ([place])   . Hyperparathyroidism, secondary ([place])   . Hypertension   . Hypertensive heart disease with end stage renal disease on dialysis ([place]) 03/13/2017  . Subacute osteomyelitis, right ankle and foot ([place])   . SVT (supraventricular tachycardia) ([place])    ? afib or atrial flutter s/p TEE/DCCV with subsequent ablation due to reoccurrence in [place]  . Type 2 diabetes mellitus (HCC)    BP 128/79   Pulse 74   SpO2 98%   Opioid Risk Score:   Fall Risk Score:  `1  Depression screen PHQ 2/9  Depression screen [facility] 2/9 01/09/2018 03/24/2017  Decreased Interest 0 0  Down, Depressed, Hopeless 0 0  PHQ - 2 Score 0 0     Review of Systems  Constitutional: Positive for diaphoresis.  HENT: Negative.   Eyes: Negative.   Respiratory: Negative.   Cardiovascular: Negative.   Gastrointestinal: Negative.   Endocrine: Negative.   Genitourinary: Negative.   Musculoskeletal: Positive for arthralgias, gait problem and myalgias.  Skin: Negative.   Allergic/Immunologic: Negative.   Hematological: Bruises/bleeds easily.  Psychiatric/Behavioral: Negative.   All other systems reviewed and are negative.      Objective:   Physical Exam Constitutional: He appears  well-developed. NAD HENT: Normocephalic.  Atraumatic Eyes: EOM are normal. No discharge Cardiovascular:  RRR. No JVD  Respiratory: CTA Bilaterally. Normal  effort   GI: Bowel sounds are normal. He exhibits no distension.  Neurological: He is alert and oriented.  Motor: 5/5 in BUE, 5/5 Left HF RLE: HF: 4+/5.   Skin: Right BKA large dry areas of eschar. Old left BKA is well healed Psych: Agitated.    Assessment & Plan:  52 year old right-handed male with history of diabetes mellitus, end-stage renal disease, SVT (maintained on Coumadin), HIV positive, diastolic congestive heart failure, left below-knee amputation presents for hospital follow up after receiving CIR for left BKA.  1. Decreased functional mobility secondary to right BKA 12/01/2017 as well as history of left BKA    Pt to follow up regarding therapies  Cont follow up with Ortho  2.  DVT Prophylaxis/Anticoagulation:    Cont Coumadin for history of SVT.   3. Pain  Management/Phantom limb pain:   Gabapentin 100 after HD ordered  Wean Oxycodone  4. ESRD.    Recs per [facility]  5. HIV.    Continue current retroviral therapy  6. Diabetes mellitus with peripheral neuropathy.    CBGs elevated  Encouraged follow up with PCP  7. Gait abnormality  Pt to follow up about therapies  Cont wheelchair for safety

## 2018-01-13 ENCOUNTER — Emergency Department (HOSPITAL_COMMUNITY): Payer: Medicare Other

## 2018-01-13 ENCOUNTER — Encounter (HOSPITAL_COMMUNITY): Payer: Self-pay | Admitting: Emergency Medicine

## 2018-01-13 ENCOUNTER — Inpatient Hospital Stay (HOSPITAL_COMMUNITY)
Admission: EM | Admit: 2018-01-13 | Discharge: 2018-01-16 | DRG: 368 | Disposition: A | Discharge status: Home / Self Care | Payer: Medicare Other | Attending: Student in an Organized Health Care Education/Training Program | Admitting: Student in an Organized Health Care Education/Training Program

## 2018-01-13 DIAGNOSIS — B20 Human immunodeficiency virus [HIV] disease: Secondary | ICD-10-CM | POA: Diagnosis not present

## 2018-01-13 DIAGNOSIS — R253 Fasciculation: Secondary | ICD-10-CM | POA: Diagnosis present

## 2018-01-13 DIAGNOSIS — Z885 Allergy status to narcotic agent status: Secondary | ICD-10-CM | POA: Diagnosis not present

## 2018-01-13 DIAGNOSIS — N186 End stage renal disease: Secondary | ICD-10-CM | POA: Diagnosis present

## 2018-01-13 DIAGNOSIS — R079 Chest pain, unspecified: Secondary | ICD-10-CM

## 2018-01-13 DIAGNOSIS — Z79899 Other long term (current) drug therapy: Secondary | ICD-10-CM | POA: Diagnosis not present

## 2018-01-13 DIAGNOSIS — R791 Abnormal coagulation profile: Secondary | ICD-10-CM | POA: Diagnosis present

## 2018-01-13 DIAGNOSIS — I132 Hypertensive heart and chronic kidney disease with heart failure and with stage 5 chronic kidney disease, or end stage renal disease: Secondary | ICD-10-CM | POA: Diagnosis present

## 2018-01-13 DIAGNOSIS — I120 Hypertensive chronic kidney disease with stage 5 chronic kidney disease or end stage renal disease: Secondary | ICD-10-CM

## 2018-01-13 DIAGNOSIS — Z8719 Personal history of other diseases of the digestive system: Secondary | ICD-10-CM

## 2018-01-13 DIAGNOSIS — Z7901 Long term (current) use of anticoagulants: Secondary | ICD-10-CM

## 2018-01-13 DIAGNOSIS — Z992 Dependence on renal dialysis: Secondary | ICD-10-CM

## 2018-01-13 DIAGNOSIS — D649 Anemia, unspecified: Secondary | ICD-10-CM

## 2018-01-13 DIAGNOSIS — I4891 Unspecified atrial fibrillation: Secondary | ICD-10-CM | POA: Diagnosis not present

## 2018-01-13 DIAGNOSIS — I480 Paroxysmal atrial fibrillation: Secondary | ICD-10-CM | POA: Diagnosis present

## 2018-01-13 DIAGNOSIS — R441 Visual hallucinations: Secondary | ICD-10-CM

## 2018-01-13 DIAGNOSIS — E1122 Type 2 diabetes mellitus with diabetic chronic kidney disease: Secondary | ICD-10-CM | POA: Diagnosis present

## 2018-01-13 DIAGNOSIS — I5042 Chronic combined systolic (congestive) and diastolic (congestive) heart failure: Secondary | ICD-10-CM | POA: Diagnosis present

## 2018-01-13 DIAGNOSIS — Z6829 Body mass index (BMI) 29.0-29.9, adult: Secondary | ICD-10-CM

## 2018-01-13 DIAGNOSIS — G546 Phantom limb syndrome with pain: Secondary | ICD-10-CM | POA: Diagnosis present

## 2018-01-13 DIAGNOSIS — Z9889 Other specified postprocedural states: Secondary | ICD-10-CM

## 2018-01-13 DIAGNOSIS — E669 Obesity, unspecified: Secondary | ICD-10-CM | POA: Diagnosis present

## 2018-01-13 DIAGNOSIS — K226 Gastro-esophageal laceration-hemorrhage syndrome: Principal | ICD-10-CM | POA: Diagnosis present

## 2018-01-13 DIAGNOSIS — E874 Mixed disorder of acid-base balance: Secondary | ICD-10-CM | POA: Diagnosis present

## 2018-01-13 DIAGNOSIS — G4700 Insomnia, unspecified: Secondary | ICD-10-CM | POA: Diagnosis present

## 2018-01-13 DIAGNOSIS — Z8249 Family history of ischemic heart disease and other diseases of the circulatory system: Secondary | ICD-10-CM | POA: Diagnosis not present

## 2018-01-13 DIAGNOSIS — D62 Acute posthemorrhagic anemia: Secondary | ICD-10-CM | POA: Diagnosis present

## 2018-01-13 DIAGNOSIS — R7989 Other specified abnormal findings of blood chemistry: Secondary | ICD-10-CM

## 2018-01-13 DIAGNOSIS — Z833 Family history of diabetes mellitus: Secondary | ICD-10-CM | POA: Diagnosis not present

## 2018-01-13 DIAGNOSIS — Z89512 Acquired absence of left leg below knee: Secondary | ICD-10-CM

## 2018-01-13 DIAGNOSIS — Z21 Asymptomatic human immunodeficiency virus [HIV] infection status: Secondary | ICD-10-CM | POA: Diagnosis present

## 2018-01-13 DIAGNOSIS — Z794 Long term (current) use of insulin: Secondary | ICD-10-CM | POA: Diagnosis not present

## 2018-01-13 DIAGNOSIS — N2581 Secondary hyperparathyroidism of renal origin: Secondary | ICD-10-CM | POA: Diagnosis present

## 2018-01-13 DIAGNOSIS — Z89511 Acquired absence of right leg below knee: Secondary | ICD-10-CM

## 2018-01-13 DIAGNOSIS — K219 Gastro-esophageal reflux disease without esophagitis: Secondary | ICD-10-CM | POA: Diagnosis present

## 2018-01-13 DIAGNOSIS — R440 Auditory hallucinations: Secondary | ICD-10-CM

## 2018-01-13 DIAGNOSIS — R000 Tachycardia, unspecified: Secondary | ICD-10-CM | POA: Diagnosis not present

## 2018-01-13 DIAGNOSIS — K921 Melena: Secondary | ICD-10-CM | POA: Diagnosis present

## 2018-01-13 LAB — POC OCCULT BLOOD, ED: FECAL OCCULT BLD: POSITIVE — AB

## 2018-01-13 LAB — BASIC METABOLIC PANEL
Anion gap: 23 — ABNORMAL HIGH (ref 5–15)
BUN: 163 mg/dL — AB (ref 6–20)
CHLORIDE: 96 mmol/L — AB (ref 101–111)
CO2: 18 mmol/L — AB (ref 22–32)
CREATININE: 9.98 mg/dL — AB (ref 0.61–1.24)
Calcium: 7.8 mg/dL — ABNORMAL LOW (ref 8.9–10.3)
GFR calc Af Amer: 6 mL/min — ABNORMAL LOW (ref >60)
GFR calc non Af Amer: 5 mL/min — ABNORMAL LOW (ref >60)
GLUCOSE: 209 mg/dL — AB (ref 65–99)
POTASSIUM: 4.7 mmol/L (ref 3.5–5.1)
Sodium: 137 mmol/L (ref 135–145)

## 2018-01-13 LAB — CBC
HEMATOCRIT: 15.6 % — AB (ref 39.0–52.0)
Hemoglobin: 5.1 g/dL — CL (ref 13.0–17.0)
MCH: 31.1 pg (ref 26.0–34.0)
MCHC: 32.7 g/dL (ref 30.0–36.0)
MCV: 95.1 fL (ref 78.0–100.0)
PLATELETS: 205 10*3/uL (ref 150–400)
RBC: 1.64 MIL/uL — ABNORMAL LOW (ref 4.22–5.81)
RDW: 23.8 % — AB (ref 11.5–15.5)
WBC: 10.7 10*3/uL — ABNORMAL HIGH (ref 4.0–10.5)

## 2018-01-13 LAB — PREPARE RBC (CROSSMATCH)

## 2018-01-13 LAB — I-STAT TROPONIN, ED: Troponin i, poc: 0.12 ng/mL (ref 0.00–0.08)

## 2018-01-13 LAB — PROTIME-INR
INR: 3.13
Prothrombin Time: 32 seconds — ABNORMAL HIGH (ref 11.4–15.2)

## 2018-01-13 MED ORDER — RENA-VITE PO TABS
1.0000 | ORAL_TABLET | Freq: Every day | ORAL | Status: DC
  Administered 2018-01-14 – 2018-01-15 (×2): 1 via ORAL
  Filled 2018-01-13 (×3): qty 1

## 2018-01-13 MED ORDER — PHYTONADIONE 5 MG PO TABS
10.0000 mg | ORAL_TABLET | Freq: Once | ORAL | Status: AC
  Administered 2018-01-13: 10 mg via ORAL
  Filled 2018-01-13: qty 2

## 2018-01-13 MED ORDER — DARBEPOETIN ALFA 200 MCG/0.4ML IJ SOSY: 200.0000 ug | PREFILLED_SYRINGE | INTRAMUSCULAR | Status: DC

## 2018-01-13 MED ORDER — SODIUM CHLORIDE 0.9 % IV SOLN
Freq: Once | INTRAVENOUS | Status: AC
  Administered 2018-01-13: 15:00:00 via INTRAVENOUS

## 2018-01-13 MED ORDER — SODIUM CHLORIDE 0.9 % IV SOLN
80.0000 mg | Freq: Two times a day (BID) | INTRAVENOUS | Status: DC
  Administered 2018-01-13 – 2018-01-15 (×5): 80 mg via INTRAVENOUS
  Filled 2018-01-13 (×7): qty 80

## 2018-01-13 MED ORDER — GABAPENTIN 100 MG PO CAPS
100.0000 mg | ORAL_CAPSULE | ORAL | Status: DC
  Administered 2018-01-15: 100 mg via ORAL
  Filled 2018-01-13: qty 1

## 2018-01-13 MED ORDER — TRAMADOL HCL 50 MG PO TABS
50.0000 mg | ORAL_TABLET | Freq: Once | ORAL | Status: AC | PRN
  Administered 2018-01-13: 50 mg via ORAL
  Filled 2018-01-13: qty 1

## 2018-01-13 MED ORDER — CINACALCET HCL 30 MG PO TABS: 90.0000 mg | ORAL_TABLET | Freq: Every day | ORAL | Status: DC

## 2018-01-13 MED ORDER — DOXERCALCIFEROL 4 MCG/2ML IV SOLN
4.0000 ug | INTRAVENOUS | Status: DC
  Filled 2018-01-13: qty 2

## 2018-01-13 MED ORDER — HEPARIN SODIUM (PORCINE) 5000 UNIT/ML IJ SOLN: 5000.0000 [IU] | Freq: Three times a day (TID) | INTRAMUSCULAR | Status: DC

## 2018-01-13 MED ORDER — RALTEGRAVIR POTASSIUM 400 MG PO TABS
400.0000 mg | ORAL_TABLET | Freq: Two times a day (BID) | ORAL | Status: DC
  Administered 2018-01-14 – 2018-01-16 (×3): 400 mg via ORAL
  Filled 2018-01-13 (×7): qty 1

## 2018-01-13 MED ORDER — SEVELAMER CARBONATE 800 MG PO TABS
4000.0000 mg | ORAL_TABLET | Freq: Three times a day (TID) | ORAL | Status: DC
  Administered 2018-01-14 – 2018-01-16 (×3): 4000 mg via ORAL
  Filled 2018-01-13 (×5): qty 5

## 2018-01-13 MED ORDER — FOSAMPRENAVIR CALCIUM 700 MG PO TABS
700.0000 mg | ORAL_TABLET | Freq: Two times a day (BID) | ORAL | Status: DC
  Filled 2018-01-13 (×3): qty 1

## 2018-01-13 MED ORDER — PANCRELIPASE (LIP-PROT-AMYL) 12000-38000 UNITS PO CPEP
12000.0000 [IU] | ORAL_CAPSULE | Freq: Three times a day (TID) | ORAL | Status: DC
  Administered 2018-01-14 – 2018-01-16 (×3): 12000 [IU] via ORAL
  Filled 2018-01-13 (×5): qty 1

## 2018-01-13 MED ORDER — RITONAVIR 100 MG PO CAPS
100.0000 mg | ORAL_CAPSULE | Freq: Every day | ORAL | Status: DC
  Administered 2018-01-16: 100 mg via ORAL
  Filled 2018-01-13 (×3): qty 1

## 2018-01-13 NOTE — ED Notes (Signed)
EDP aware of troponin 

## 2018-01-13 NOTE — ED Triage Notes (Addendum)
Per [facility] patient complaining of upper mid chest pain and dizziness x 3 days. Patient was at dialysis when he called ems. Initial bp 70/40. Last one with ems 134/70. EKG unremarkable. Only 17 min of treatment completed. Patient Tuesday, Thursday, Saturday. Last dialysis Saturday. Patient reports recently beginning upper body workout. Denies radiation of pain. Denies SOB.Patient alert and oriented x 4. Received 324 mg aspirin with ems. Bilateral bka.

## 2018-01-13 NOTE — ED Notes (Signed)
Spoke with Dr. Danford Bad who will be coming to see pt and further explain

## 2018-01-13 NOTE — ED Notes (Signed)
Phlebotomy at bedside.

## 2018-01-13 NOTE — Consult Note (Signed)
Subjective:   HPI The patient is a 52 year old male with end-stage renal disease secondary to uncontrolled diabetes mellitus who is on hemodialysis. He also has a history of HIV. He also has a history of hypertension and atrial fibrillation. He is on Coumadin. He has been having intermittent chest pain for the past 2 days. It is a tightness feeling and he is also been short of breath. He was in dialysis today but they had to quit because he became short of breath and dizzy.  Patient also states he has been experiencing some blood in his bowel movements over the past 2 days. He describes it as darkish red in color. In July of last year he had an endoscopy showing a Mallory-Weiss tear which was not bleeding. In August of last year he had a colonoscopy showing segmental ischemic colitis in the proximal ascending colon.  Most recent INR is over 3. Hemoglobin and hematocrit noted.   Review of Systems   Past Medical History:  Diagnosis Date  . Anemia   . Chronic combined systolic and diastolic CHF (congestive heart failure) ([place])   . Diabetes ([place])   . Diabetic wet gangrene of the foot ([place]) 09/04/2017  . ESRD (end stage renal disease) [facility])    [place] [address] T, Th, Sat  . HIV disease ([place])   . Hyperparathyroidism, secondary ([place])   . Hypertension   . Hypertensive heart disease with end stage renal disease on dialysis ([place]) 03/13/2017  . Subacute osteomyelitis, right ankle and foot ([place])   . SVT (supraventricular tachycardia) ([place])    ? afib or atrial flutter s/p TEE/DCCV with subsequent ablation due to reoccurrence in [place]  . Type 2 diabetes mellitus ([place])    Past Surgical History:  Procedure Laterality Date  . AMPUTATION Left    foot  . AMPUTATION Right 12/06/2017   Procedure: AMPUTATION BELOW KNEE;  Surgeon: [name], MD;  Location: [place];  Service: Orthopedics;  Laterality: Right;  . APPLICATION OF WOUND VAC Right 09/04/2017   Procedure: APPLICATION OF WOUND VAC;  Surgeon:  [name], DPM;  Location: [place];  Service: Podiatry;  Laterality: Right;  . APPLICATION OF WOUND VAC  12/06/2017   Procedure: APPLICATION OF WOUND VAC;  Surgeon: [name], MD;  Location: [place];  Service: Orthopedics;;  . COLONOSCOPY WITH PROPOFOL N/A 07/30/2017   Procedure: COLONOSCOPY WITH PROPOFOL;  Surgeon: [name], MD;  Location: [place];  Service: Endoscopy;  Laterality: N/A;  . ESOPHAGOGASTRODUODENOSCOPY (EGD) WITH PROPOFOL N/A 07/28/2017   Procedure: ESOPHAGOGASTRODUODENOSCOPY (EGD) WITH PROPOFOL;  Surgeon: [name], MD;  Location: [place];  Service: Endoscopy;  Laterality: N/A;  . FLEXIBLE SIGMOIDOSCOPY N/A 07/27/2017   Procedure: FLEXIBLE SIGMOIDOSCOPY;  Surgeon: [name], MD;  [facility] END[facility];  Service: Endoscopy;  Laterality: N/A;  . GRAFT APPLICATION Right 55/97/4163   Procedure: SKIN GRAFT APPLICATION RIGHT FOOT;  Surgeon: [name], DPM;  Location: [place];  Service: Podiatry;  Laterality: Right;  . I&D EXTREMITY Right 09/04/2017   Procedure: IRRIGATION AND DEBRIDEMENT EXTREMITY;  Surgeon: [name], DPM;  Location: [place];  Service: Podiatry;  Laterality: Right;  . I&D EXTREMITY Right 11/12/2017   Procedure: IRRIGATION AND DEBRIDEMENT ULCER RIGHT FOOT;  Surgeon: [name], DPM;  Location: [place];  Service: Podiatry;  Laterality: Right;  . RIGHT AV FISTULA PLACEMENT  10/19/2012  . WOUND DEBRIDEMENT N/A 09/24/2017   Procedure: DEBRIDEMENT WOUND;  Surgeon: [name], DPM;  Location:  [place] [facility];  Service: Podiatry;  Laterality: N/A;   Social History   Socioeconomic History  . Marital status: Married    Spouse name: kim  . Number of children: 3  . Years of education: college  . Highest education level: Not on file  Social Needs  . Financial resource strain: Not on file  . Food insecurity - worry: Not on file  . Food insecurity - inability: Not on file  . Transportation needs - medical: Not on file  .  Transportation needs - non-medical: Not on file  Occupational History  . Occupation: disabled  Tobacco Use  . Smoking status: Never Smoker  . Smokeless tobacco: Never Used  Substance and Sexual Activity  . Alcohol use: No  . Drug use: No  . Sexual activity: Not on file  Other Topics Concern  . Not on file  Social History Narrative  . Not on file   family history includes Diabetes in his mother; Heart disease in his father and mother.  Current Facility-Administered Medications:  .  cinacalcet (SENSIPAR) tablet 90 mg, 90 mg, Oral, Q supper, Penninger, [place], [place] .  Darbepoetin Alfa (ARANESP) injection 200 mcg, 200 mcg, Intravenous, Q Tue-HD, Penninger, Ria Comment, PA .  doxercalciferol (HECTOROL) injection 4 mcg, 4 mcg, Intravenous, Q T,Th,Sa-HD, Penninger, [place], PA, Stopped at 01/13/18 1512 .  multivitamin (RENA-VIT) tablet 1 tablet, 1 tablet, Oral, QHS, Penninger, Ria Comment, PA .  sevelamer carbonate (RENVELA) tablet 4,000 mg, 4,000 mg, Oral, TID WC, Penninger, Ria Comment, PA  Current Outpatient Medications:  .  amLODipine (NORVASC) 5 MG tablet, Take 1 tablet (5 mg total) by mouth daily., Disp: 30 tablet, Rfl: 0 .  camphor-menthol (SARNA) lotion, Apply topically as needed for itching., Disp: 222 mL, Rfl: 0 .  ferric gluconate 125 mg in sodium chloride 0.9 % 100 mL, Inject 125 mg into the vein Every Tuesday,Thursday,and Saturday with dialysis., Disp: , Rfl:  .  fosamprenavir (LEXIVA) 700 MG tablet, Take 700 mg by mouth 2 (two) times daily. , Disp: , Rfl:  .  gabapentin (NEURONTIN) 100 MG capsule, Take 1 capsule (100 mg total) by mouth every dialysis., Disp: 15 capsule, Rfl: 1 .  insulin glargine (LANTUS) 100 unit/mL SOPN, Inject 0.3 mLs (30 Units total) into the skin at bedtime., Disp: 15 mL, Rfl: 11 .  lipase/protease/amylase (CREON) 12000 units CPEP capsule, Take 1 capsule (12,000 Units total) by mouth 3 (three) times daily before meals., Disp: 270 capsule, Rfl: 0 .  lisinopril  (PRINIVIL,ZESTRIL) 40 MG tablet, Take 1 tablet (40 mg total) by mouth daily., Disp: 30 tablet, Rfl: 0 .  metoprolol tartrate (LOPRESSOR) 25 MG tablet, Take 1 tablet (25 mg total) by mouth 2 (two) times daily., Disp: 60 tablet, Rfl: 0 .  multivitamin (RENA-VIT) TABS tablet, Take 1 tablet by mouth at bedtime., Disp: 30 tablet, Rfl: 0 .  naproxen sodium (ALEVE) 220 MG tablet, Take 440 mg by mouth as needed (pain)., Disp: , Rfl:  .  polycarbophil (FIBERCON) 625 MG tablet, Take 1 tablet (625 mg total) by mouth daily., Disp: 30 tablet, Rfl: 0 .  raltegravir (ISENTRESS) 400 MG tablet, Take 400 mg by mouth 2 (two) times daily., Disp: , Rfl:  .  ritonavir (NORVIR) 100 MG capsule, Take 100 mg by mouth daily with breakfast., Disp: , Rfl:  .  sevelamer carbonate (RENVELA) 800 MG tablet, Take 800 mg by mouth daily., Disp: , Rfl:  .  silver sulfADIAZINE (SILVADENE) 1 % cream, Apply 1 application topically daily., Disp: 50  g, Rfl: 0 .  warfarin (COUMADIN) 10 MG tablet, Take 10 mg by mouth See admin instructions. Take 10 mg by mouth daily Sunday, Monday, Wednesday and Friday, Disp: , Rfl:  .  warfarin (COUMADIN) 7.5 MG tablet, Take 7.5 mg by mouth See admin instructions. Take 7.5 mg by mouth daily on Tuesday, Thursday ans Saturday, Disp: , Rfl:  Allergies  Allergen Reactions  . Oxycodone-Acetaminophen     Hallucinations/delirum     Objective:     BP 101/66   Pulse (!) 102   Temp 98.7 F (37.1 C) (Oral)   Resp 15   Ht 6\' 6"  (1.981 m)   Wt 108.9 kg (240 lb)   SpO2 100%   BMI 27.73 kg/m   No acute distress  Heart irregular rhythm  Lungs clear  Abdomen soft and nontender  Laboratory No components found for: D1    Assessment:     #1 anemia with a hemoglobin of 5.1  #2. Rectal bleeding  #3. 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 History of Mallory-Weiss tear last year with no evidence of hematemesis at this time  #5. History of ischemic colitis in the proximal ascending colon last  year  #6. Multiple medical problems as mentioned above      Plan:     I would recommend supportive care for now with correction of his coagulopathy. Transfuse blood as needed. I am not sure he will need further endoscopic evaluation at this time if the bleeding stops and he stabilizes. We'll follow clinically. Lab Results  Component Value Date   HGB 5.1 (LL) 01/13/2018   HGB 8.4 (L) 12/20/2017   HGB 8.0 (L) 12/18/2017   HCT 15.6 (L) 01/13/2018   HCT 27.1 (L) 12/20/2017   HCT 25.4 (L) 12/18/2017   ALKPHOS 102 12/05/2017   ALKPHOS 92 09/03/2017   ALKPHOS 103 07/25/2017   AST 15 12/05/2017   AST 16 09/03/2017   AST 19 07/25/2017   ALT 14 (L) 12/05/2017   ALT 14 (L) 09/03/2017   ALT 16 (L) 07/25/2017

## 2018-01-13 NOTE — ED Notes (Signed)
Got patient undress into a gown on the monitor did ekg shown to Dr Eulis Foster patient is resting with call bell in reach

## 2018-01-13 NOTE — ED Notes (Signed)
Patient transported to X-ray 

## 2018-01-13 NOTE — ED Notes (Signed)
Admitting paged for pain medicine request

## 2018-01-13 NOTE — ED Notes (Signed)
Pt signed electronic consent for blood 

## 2018-01-13 NOTE — Consult Note (Signed)
[facility] Renal Consultation Note    Indication for Consultation:  Management of ESRD/hemodialysis; anemia, hypertension/volume and secondary hyperparathyroidism  TIR:[id number], No Pcp Per  HPI: Thomas [name] is a 52 y.o. male. 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  Thomas [name] presented to the ED with complaints of intermittent chest pain, and dark stools for the last 3-4 days.  About 15 min into dialysis today the CP started again and was associated with SOB, nausea and hypotension so he signed off and came in.  Admits to dizziness, weakness, itching, dry cough, and dysphagia, states he feels like his binders were getting stuck when he swallowed the last 2 days.  Denies fever, chills and n/v/d/c. On a side note he describes difficulty sleeping, insomnia, fidgeting and hallucinations since having his R BKA.  Pertinent findings in the ED include Hgb 5.1, BUN 163, +FOBT, and unremarkable CXR.  He is being admitted for symptomatic anemia  As far as dialysis is concerned, patient has been compliant with prescribed treatment regimen. He has been reaching his dry weight and tolerating dialysis well using his RUE AVF.  Past Medical History:  Diagnosis Date  . Anemia   . Chronic combined systolic and diastolic CHF (congestive heart failure) ([place])   . Diabetes ([place])   . Diabetic wet gangrene of the foot ([place]) 09/04/2017  . ESRD (end stage renal disease) [facility])    [facility] [address] T, Th, Sat  . HIV disease ([place])   . Hyperparathyroidism, secondary ([place])   . Hypertension   . Hypertensive heart  disease with end stage renal disease on dialysis ([place]) 03/13/2017  . Subacute osteomyelitis, right ankle and foot ([place])   . SVT (supraventricular tachycardia) ([place])    ? afib or atrial flutter s/p TEE/DCCV with subsequent ablation due to reoccurrence in [place]  . Type 2 diabetes mellitus ([place])    Past Surgical History:  Procedure Laterality Date  . AMPUTATION Left    foot  . AMPUTATION Right 12/06/2017   Procedure: AMPUTATION BELOW KNEE;  Surgeon: [name], MD;  Location: [place];  Service: Orthopedics;  Laterality: Right;  . APPLICATION OF WOUND VAC Right 09/04/2017   Procedure: APPLICATION OF WOUND VAC;  Surgeon: [name], DPM;  Location: [place];  Service: Podiatry;  Laterality: Right;  . APPLICATION OF WOUND VAC  12/06/2017   Procedure: APPLICATION OF WOUND VAC;  Surgeon: [name], MD;  Location: [place];  Service: Orthopedics;;  . COLONOSCOPY WITH PROPOFOL N/A 07/30/2017   Procedure: COLONOSCOPY WITH PROPOFOL;  Surgeon: [name], MD;  Location: [place];  Service: Endoscopy;  Laterality: N/A;  . ESOPHAGOGASTRODUODENOSCOPY (EGD) WITH PROPOFOL N/A 07/28/2017   Procedure: ESOPHAGOGASTRODUODENOSCOPY (EGD) WITH PROPOFOL;  Surgeon: [name], MD;  Location: [place];  Service: Endoscopy;  Laterality: N/A;  . FLEXIBLE SIGMOIDOSCOPY N/A 07/27/2017   Procedure: FLEXIBLE SIGMOIDOSCOPY;  Surgeon: [name], MD;  Location: [facility];  Service: Endoscopy;  Laterality: N/A;  . GRAFT APPLICATION Right 08/67/6195   Procedure: SKIN GRAFT APPLICATION RIGHT FOOT;  Surgeon: [name], DPM;  Location: [facility]  OR;  Service: Podiatry;  Laterality: Right;  . I&D EXTREMITY Right 09/04/2017   Procedure: IRRIGATION AND DEBRIDEMENT EXTREMITY;  Surgeon: [name], DPM;  Location: [place];  Service: Podiatry;  Laterality: Right;  . I&D EXTREMITY Right 11/12/2017   Procedure: IRRIGATION AND DEBRIDEMENT ULCER RIGHT FOOT;  Surgeon: [name], DPM;  Location: [place];  Service:  Podiatry;  Laterality: Right;  . RIGHT AV FISTULA PLACEMENT  10/19/2012  . WOUND DEBRIDEMENT N/A 09/24/2017   Procedure: DEBRIDEMENT WOUND;  Surgeon: [name], DPM;  Location: [place];  Service: Podiatry;  Laterality: N/A;   Family History  Problem Relation Age of Onset  . Heart disease Mother   . Diabetes Mother   . Heart disease Father   . Cancer Neg Hx    Social History:  reports that  has never smoked. he has never used smokeless tobacco. He reports that he does not drink alcohol or use drugs. No Known Allergies Prior to Admission medications   Medication Sig Start Date End Date Taking? Authorizing Provider  amLODipine (NORVASC) 5 MG tablet Take 1 tablet (5 mg total) by mouth daily. 12/19/17  Yes Angiulli, Lavon Paganini, PA-C  camphor-menthol [facility]) lotion Apply topically as needed for itching. 12/11/17  Yes Colbert Ewing, MD  ferric gluconate 125 mg in sodium chloride 0.9 % 100 mL Inject 125 mg into the vein Every Tuesday,Thursday,and Saturday with dialysis. 05/10/17  Yes Hosie Poisson, MD  fosamprenavir (LEXIVA) 700 MG tablet Take 700 mg by mouth 2 (two) times daily.    Yes [provider]  gabapentin (NEURONTIN) 100 MG capsule Take 1 capsule (100 mg total) by mouth every dialysis. 01/09/18  Yes Jamse Arn, MD  insulin glargine (LANTUS) 100 unit/mL SOPN Inject 0.3 mLs (30 Units total) into the skin at bedtime. 12/19/17  Yes Angiulli, Lavon Paganini, PA-C  lipase/protease/amylase (CREON) 12000 units CPEP capsule Take 1 capsule (12,000 Units total) by mouth 3 (three) times daily before meals. 12/19/17  Yes Angiulli, Lavon Paganini, PA-C  lisinopril (PRINIVIL,ZESTRIL) 40 MG tablet Take 1 tablet (40 mg total) by mouth daily. 12/19/17  Yes Angiulli, Lavon Paganini, PA-C  metoprolol tartrate (LOPRESSOR) 25 MG tablet Take 1 tablet (25 mg total) by mouth 2 (two) times daily. 12/19/17  Yes Angiulli, Lavon Paganini, PA-C  multivitamin (RENA-VIT) TABS tablet Take 1 tablet by mouth at bedtime. 12/19/17   Yes Angiulli, Lavon Paganini, PA-C  naproxen sodium (ALEVE) 220 MG tablet Take 440 mg by mouth as needed (pain).   Yes [provider]  Oxycodone HCl 10 MG TABS Take 1 tablet (10 mg total) by mouth every 3 (three) hours as needed ((score 7 to 10)). 12/19/17  Yes Angiulli, Lavon Paganini, PA-C  polycarbophil (FIBERCON) 625 MG tablet Take 1 tablet (625 mg total) by mouth daily. 12/19/17  Yes Angiulli, Lavon Paganini, PA-C  raltegravir (ISENTRESS) 400 MG tablet Take 400 mg by mouth 2 (two) times daily.   Yes [provider]  ritonavir (NORVIR) 100 MG capsule Take 100 mg by mouth daily with breakfast.   Yes [provider]  sevelamer carbonate (RENVELA) 800 MG tablet Take 800 mg by mouth daily. 01/10/18  Yes [provider]  silver sulfADIAZINE (SILVADENE) 1 % cream Apply 1 application topically daily. 01/02/18  Yes Dondra Prader R, NP  warfarin (COUMADIN) 10 MG tablet Take 10 mg by mouth See admin instructions. Take 10 mg by mouth daily Sunday, Monday, Wednesday and Friday   Yes [provider]  warfarin (COUMADIN) 7.5 MG tablet Take 7.5 mg by mouth See admin instructions. Take 7.5 mg by mouth daily on Tuesday, Thursday ans Saturday   Yes [provider]   Current Facility-Administered Medications  Medication Dose Route Frequency Provider Last Rate Last Dose  . 0.9 %  sodium chloride infusion   Intravenous Once Jillyn Ledger, PA-C       Current Outpatient Medications  Medication Sig Dispense Refill  . amLODipine (NORVASC) 5 MG tablet Take 1 tablet (5 mg total) by mouth daily. 30 tablet 0  . camphor-menthol (SARNA) lotion Apply topically as needed for itching. 222 mL 0  . ferric gluconate 125 mg in sodium chloride 0.9 % 100 mL Inject 125 mg into the vein Every Tuesday,Thursday,and Saturday with dialysis.    . fosamprenavir (LEXIVA) 700 MG tablet Take 700 mg by mouth 2 (two) times daily.     Marland Kitchen gabapentin (NEURONTIN) 100 MG capsule Take 1 capsule (100 mg total) by  mouth every dialysis. 15 capsule 1  . insulin glargine (LANTUS) 100 unit/mL SOPN Inject 0.3 mLs (30 Units total) into the skin at bedtime. 15 mL 11  . lipase/protease/amylase (CREON) 12000 units CPEP capsule Take 1 capsule (12,000 Units total) by mouth 3 (three) times daily before meals. 270 capsule 0  . lisinopril (PRINIVIL,ZESTRIL) 40 MG tablet Take 1 tablet (40 mg total) by mouth daily. 30 tablet 0  . metoprolol tartrate (LOPRESSOR) 25 MG tablet Take 1 tablet (25 mg total) by mouth 2 (two) times daily. 60 tablet 0  . multivitamin (RENA-VIT) TABS tablet Take 1 tablet by mouth at bedtime. 30 tablet 0  . naproxen sodium (ALEVE) 220 MG tablet Take 440 mg by mouth as needed (pain).    . Oxycodone HCl 10 MG TABS Take 1 tablet (10 mg total) by mouth every 3 (three) hours as needed ((score 7 to 10)). 30 tablet 0  . polycarbophil (FIBERCON) 625 MG tablet Take 1 tablet (625 mg total) by mouth daily. 30 tablet 0  . raltegravir (ISENTRESS) 400 MG tablet Take 400 mg by mouth 2 (two) times daily.    . ritonavir (NORVIR) 100 MG capsule Take 100 mg by mouth daily with breakfast.    . sevelamer carbonate (RENVELA) 800 MG tablet Take 800 mg by mouth daily.    . silver sulfADIAZINE (SILVADENE) 1 % cream Apply 1 application topically daily. 50 g 0  . warfarin (COUMADIN) 10 MG tablet Take 10 mg by mouth See admin instructions. Take 10 mg by mouth daily Sunday, Monday, Wednesday and Friday    . warfarin (COUMADIN) 7.5 MG tablet Take 7.5 mg by mouth See admin instructions. Take 7.5 mg by mouth daily on Tuesday, Thursday ans Saturday     Labs: Basic Metabolic Panel: Recent Labs  Lab 01/13/18 1053  NA 137  K 4.7  CL 96*  CO2 18*  GLUCOSE 209*  BUN 163*  CREATININE 9.98*  CALCIUM 7.8*   CBC: Recent Labs  Lab 01/13/18 1053  WBC 10.7*  HGB 5.1*  HCT 15.6*  MCV 95.1  PLT 205   Cardiac Enzymes: No results for input(s): CKTOTAL, CKMB, CKMBINDEX, TROPONINI in the last 168 hours. CBG: No results for  input(s): GLUCAP in the last 168 hours. Iron Studies: No results for input(s): IRON, TIBC, TRANSFERRIN, FERRITIN in the last 72 hours. Studies/Results: Dg Chest 2 View  Result Date: 01/13/2018 CLINICAL DATA:  52 year old male complaining of upper mid chest pain and dizziness for the past 3 days. EXAM: CHEST  2 VIEW COMPARISON:  Chest x-ray 12/06/2017. FINDINGS: Lung volumes are normal. No consolidative airspace disease. No pleural effusions. No pneumothorax. No pulmonary nodule or mass noted. Pulmonary vasculature and the cardiomediastinal silhouette are within normal limits. IMPRESSION: No radiographic evidence of acute cardiopulmonary disease. Electronically Signed   By: Vinnie Langton M.D.   On: 01/13/2018 11:30    ROS: All others negative except those listed in HPI.   Physical Exam: Vitals:   01/13/18 1230 01/13/18 1245 01/13/18 1300 01/13/18 1315  BP: (!) 89/55 110/64 97/61 122/69  Pulse: 99 99 99 99  Resp: 19 (!) 23 16 15   Temp:      TempSrc:      SpO2: 100% 100% 100% 100%  Weight:      Height:         General: WDWN, NAD tired looking male obese,  Head: NCAT, sclera not icteric, MMM fundi with some scarring,  Neck: Supple. PCL,  Large area post of thickened icthyotic skin Lungs: mostly CTAB, bibasilar rales in bases, increased WOB Heart: RRR. No  rubs or gallops. Gr 2/6 M Abdomen: obese, soft, nontender, +BS, no guarding, no rebound tenderness Liver down 6 cm Lower extremities: b/l BKA, +tenderness on R, no edema, both in sock Neuro: A&Ox3. Moves all extremities spontaneously. Psych:  Responds to questions appropriately with a normal affect. Dialysis Access: RU AVF +t  Dialysis Orders:  MWF  - NW  4.25 hrs, BFR 500, DFR  800,  EDW 115.5kg, 3K/ 2.25Ca   Access: RU AVF  Heparin None mircera 218mcg IV q 2wks - last 01/01/18 hectorol 78mcg IV qHD  Last Labs: 01/08/18 Hgb 10.1, Ca 8.4, P 6.3, PTH 1056, Albumin 3.7, 12/27:TSAT 39%  Assessment/Plan: 1.  Symptomatic anemia  2/2 GIB- h/o mallory weis tear- +FOBT, Hgb drop from 10.1 last week as outpatient to 5.1 today.  2Units PRBC ordered in ED, can give 1 Unit in ED and 1 Unit with HD. On ESA as outpatient last dosed 01/01/18, 248mcg aranesp ordered with HD today.  Per primary 2. Chest pain/SOB - likely due to anemia/GIB.  EKG unchanged, CXR unremarkable. Per primary. ? Esophogeal,  3. ESRD -  Dialysis shortened. Orders written for HD today. Transfuse 1Unit w/HD. K4.7, using 2K bath today, on 3k as outpatient, monitor. NO HEPARIN. 4.  Hypertension/volume  - If bed weight correct significantly under EDW. BP soft. Does not appear volume overloaded on exam, titrate down volume as tolerated. Will need reduced EDW at d/c. 5.  Secondary Hyperparathyroidism - Cor Ca 8.0, P 7.2. Continue VDRA, Renvela 5AC TID, and sensipar.  6.  Nutrition - renal diet with fluid restriction, renavite. 7. DM - per primary 8. B/l BKA - per primary 9. Sleep disturbance - possibly med related 10. HIV - per primary/ID 11. A fib - on warfarin - per primary  Jen Mow, PA-C [facility] Pager: [phone] 01/13/2018, 1:47 PM I have seen and examined this patient and agree with the plan of care seen, examined, eval, counseled. Discussed with extender. Jeneen Rinks Argus Caraher 01/13/2018, 3:47 PM

## 2018-01-13 NOTE — ED Notes (Signed)
Meal tray delivered.

## 2018-01-13 NOTE — H&P (Signed)
Date: 01/13/2018               Patient Name:  Thomas [name] MRN: [id number]  DOB: 1966-04-09 Age / Sex: 52 y.o., male   PCP: Patient, No Pcp Per         Medical Service: Internal Medicine Teaching Service         Attending Physician: Dr. Daleen Bo, MD    First Contact: Dr. Tarri Abernethy Pager: [phone]  Second Contact: Dr. Danford Bad Pager: [phone]       After Hours (After 5p/  First Contact Pager: [phone]  weekends / holidays): Second Contact Pager: [phone]   Chief Complaint: Chest Pain   History of Present Illness: Thomas [name] is a 52 y.o male with ESRD secondary to uncontrolled diabetes mellitus on HD (TRS), HIV (last CD4 34), hypertension, A. fib status post ablation currently on Coumadin who presented to the ED for episodic chest pain that started on 1/12. Patient describes the chest pain is achy across his upper chest. Describes it as a tightness/shortness of breath. It does not radiate. It is associated with nausea and diaphoresis. The episodes tend to last around 15 minutes and occur both with rest and movement. He states that the pain/pressure is positional and relieved when he is sitting up and worse when lying down. It is not worse with exertion and does not change with deep inspiration. He did have recurrence of the chest pain/pressure during hemodialysis today. He denies a history of CAD.  Patient also endorses bloody bowel movements for the past 1-2 days. States that he has left lower quadrant abdominal pain that is relieved with bowel movements. The blood in his stool is mixed. He is unsure if it is dark or bright red, but wife at bedside says that it is primarily bright red. He states that since talking with all the providers today he feels that his chest pain and bloody bowel movements are all related to a previous tear like he had while hospitalized in August. Per chart review the patient was hospitalized in August 2018 with a Mallory-Weiss tear and ischemic colitis. He does  take his Coumadin consistently every night at 10 PM. He denies excessive use of NSAIDs, alcohol, tobacco. He denies fevers, chills, emesis, abdominal pain other than the pain when he needs to have a bowel movement, myalgias, arthralgias, dysuria, hematuria, pain with eating.  Patient is concerned with getting narcotics while in the hospital. States that he has adverse reactions to oxycodone. Adverse reactions include visual and auditory hallucinations. Requesting that he does not get oxycodone while he is here. Also voices concerns about insomnia.  States that he and his wife watch TV until 2-3AM and then is unable to get to sleep. Does nap throughout the day and has excessive daytime sleepiness. Wife denies excessive snoring. Does state that at night he seems to be experiencing auditory and visual hallucinations. He has been prescribed Ambien and taking that intermittently for his insomnia.  Meds:  Current Meds  Medication Sig  . amLODipine (NORVASC) 5 MG tablet Take 1 tablet (5 mg total) by mouth daily.  . camphor-menthol (SARNA) lotion Apply topically as needed for itching.  . ferric gluconate 125 mg in sodium chloride 0.9 % 100 mL Inject 125 mg into the vein Every Tuesday,Thursday,and Saturday with dialysis.  . fosamprenavir (LEXIVA) 700 MG tablet Take 700 mg by mouth 2 (two) times daily.   Marland Kitchen gabapentin (NEURONTIN) 100 MG capsule Take 1 capsule (100 mg total) by  mouth every dialysis.  Marland Kitchen insulin glargine (LANTUS) 100 unit/mL SOPN Inject 0.3 mLs (30 Units total) into the skin at bedtime.  . lipase/protease/amylase (CREON) 12000 units CPEP capsule Take 1 capsule (12,000 Units total) by mouth 3 (three) times daily before meals.  Marland Kitchen lisinopril (PRINIVIL,ZESTRIL) 40 MG tablet Take 1 tablet (40 mg total) by mouth daily.  . metoprolol tartrate (LOPRESSOR) 25 MG tablet Take 1 tablet (25 mg total) by mouth 2 (two) times daily.  . multivitamin (RENA-VIT) TABS tablet Take 1 tablet by mouth at bedtime.  .  naproxen sodium (ALEVE) 220 MG tablet Take 440 mg by mouth as needed (pain).  . polycarbophil (FIBERCON) 625 MG tablet Take 1 tablet (625 mg total) by mouth daily.  . raltegravir (ISENTRESS) 400 MG tablet Take 400 mg by mouth 2 (two) times daily.  . ritonavir (NORVIR) 100 MG capsule Take 100 mg by mouth daily with breakfast.  . sevelamer carbonate (RENVELA) 800 MG tablet Take 800 mg by mouth daily.  . silver sulfADIAZINE (SILVADENE) 1 % cream Apply 1 application topically daily.  Marland Kitchen warfarin (COUMADIN) 10 MG tablet Take 10 mg by mouth See admin instructions. Take 10 mg by mouth daily Sunday, Monday, Wednesday and Friday  . warfarin (COUMADIN) 7.5 MG tablet Take 7.5 mg by mouth See admin instructions. Take 7.5 mg by mouth daily on Tuesday, Thursday ans Saturday  . [DISCONTINUED] Oxycodone HCl 10 MG TABS Take 1 tablet (10 mg total) by mouth every 3 (three) hours as needed ((score 7 to 10)).   Allergies: Allergies as of 01/13/2018 - Review Complete 01/13/2018  Allergen Reaction Noted  . Oxycodone-acetaminophen  01/13/2018   Past Medical History:  Diagnosis Date  . Anemia   . Chronic combined systolic and diastolic CHF (congestive heart failure) ([place])   . Diabetes ([place])   . Diabetic wet gangrene of the foot ([place]) 09/04/2017  . ESRD (end stage renal disease) [facility])    [facility] [address] T, Th, Sat  . HIV disease ([place])   . Hyperparathyroidism, secondary ([place])   . Hypertension   . Hypertensive heart disease with end stage renal disease on dialysis ([place]) 03/13/2017  . Subacute osteomyelitis, right ankle and foot ([place])   . SVT (supraventricular tachycardia) ([place])    ? afib or atrial flutter s/p TEE/DCCV with subsequent ablation due to reoccurrence in [place]  . Type 2 diabetes mellitus ([place])    Family History:  Mother: + CAD, DM Father: + CAD  Social History:  Lives with his wife. Denies current or former use of tobacco, alcohol, illicit substances  Review of Systems: A complete ROS was  negative except as per HPI.   Physical Exam: Blood pressure (!) 103/56, pulse (!) 101, temperature 98.6 F (37 C), temperature source Oral, resp. rate 19, height 6\' 6"  (1.981 m), weight 240 lb (108.9 kg), SpO2 100 %.  General: Obese male in no acute distress HENT: Normocephalic, atraumatic, moist mucous membranes Pulm: Good air movement, no wheezing or crackles. CV: Regular rate and rhythm, no murmurs, no rubs Abdomen: Active bowel sounds, soft, nondistended, no tenderness to palpation, umbilical hernia noted Extremities: Bilateral below the knee amputation Skin: Warm and dry Neuro: Alert and oriented x3  EKG: personally reviewed: my interpretation is sinus rhythm with normal axis. PR prolongation. No ST elevation, PR interval depression, T wave inversion.  CXR: personally reviewed: my interpretation is good rotation, penetration, and inspiration. No bony or soft tissue abnormalities. No cardiomegaly. No infiltrates or opacities. No pleural effusions.  Assessment & Plan by Problem: Active Problems:   ESRD on dialysis ([place])   PAF (paroxysmal atrial fibrillation) ([facility])   Acute blood loss anemia  Symptomatic Anemia 2/2 ? Upper GBI.  Thomas [name] is a 52 year old male with a history of ESRD on HD, A. fib status post ablation on Coumadin, and previous hospitalization in August 2018 for Mallory-Weiss tear and ischemic colitis who presented to the ED with symptomatic anemia. Patient's hemoglobin found to be 5.1 down from a baseline of approximately 8. Patient denies abdominal and chest pain currently. Several lab abnormalities including high anion gap acidosis with overlying metabolic alkalosis, troponinemia, and elevated BUN; however, patient was not able to complete HD today likely explaining the high anion gap acidosis. The elevated BUN while it is likely related to his end-stage renal disease that could signify upper GI bleed. Patient denies excessive use of NSAIDs, alcohol, tobacco, emesis  making PUD, gastritis, or recurrent Mallory-Weiss tear less likely. No signs of cirrhosis to lead me to believe this is secondary to esophageal varices. ESRD has been associated with angiodysplasia of the stomach or duodenum and therefore this is on the differential. Ischemic colitis is less likely given that the patient does not have abdominal pain however it is still in the differential given the patient's high anion gap acidosis. We have consulted gastroenterology. - Hold warfarin - 2 large bore IVs - 2 units packed red blood cells - IV PPI 80 mg twice daily - No need for antibiotics at this point - Continue to monitor INR, will give vitamin K 10 mg PO - Gastroenterology consulted, appreciate the recommendations  Troponinemia - EKG unremarkable  - Likely elevated in the setting of anemia and ESRD - Continue to trend   Insomnia  - Avoid Ambien - We will add as needed medication  A. fib status post ablation - Currently in sinus rhythm - Hold warfarin - Hold metoprolol  ESRD on hemodialysis - Several lab abnormalities on BMP including metabolic acidosis. - Does not appear volume overload    - Nephrology consulted  Hypertension - Home medications include amlodipine, lisinopril, and metoprolol. Will hold all in the setting of acute GI bleed.  HIV - Continue fosamprenavir, raltegravir, and ritonavir  Diabetes mellitus - Continue Lantus 20 units nightly + sliding scale with meals once patient able to eat  Diet: N.p.o. until GI has evaluated the patient DVT prophylaxis: SCD CODE STATUS: Full code  Dispo: Admit patient to Inpatient with expected length of stay greater than 2 midnights.  Signed: [name], MD 01/13/2018, 2:49 PM  My Pager: [phone]

## 2018-01-13 NOTE — ED Provider Notes (Addendum)
[facility] EMERGENCY DEPARTMENT Provider Note   CSN: [id number] Arrival date & time: 01/13/18  1010     History   Chief Complaint Chief Complaint  Patient presents with  . Chest Pain    HPI Thomas [name] is a 52 y.o. male with a history of ESRD on hemodialysis ([facility] [place]; T/Th/Sat), HIV (last CD4 - 39), HTN, DM, Afib/flutter s/p ablation currently on coumadin who presents to the ED today for chest pain. Patient notes since Saturday he has been having intermittent episodes of chest pain that he describes as an achy/dull pain in the upper, center of his chest with associated nausea, shortness of breath and diaphoresis. There is no of the pain to the neck, jaw, back or either shoulders/arms.  The pain typically lasts 15 minutes and occurs while the patient is at rest. The pain is non-positional, non-exertional, and non-pleuritic. It has occurred 4 times since start. Last episode during dialysis today ~ 17 minutes into tx. patient notes that he believes he has been dehydrated recently.  He notes that when he checked him to have dialysis today he was at his baseline weight that he usually is after dialysis.  He also notes that his blood pressure was soft (EMS reports 70/40) when he was first seen by EMS.  Patient received 324 mg aspirin in route.  No current chest pain, shortness breath, nausea. Patient does note that he recently started upper body workout with light dumbbells.  He is unsure if this is associated with this.  Patient denies any fever, chills, cough, hemoptysis, emesis, hematemesis, orthopnea.  He is a never smoker.  Family history of heart disease including mother.  No alcohol or drug use.  Patient also notes episodes of dizziness that commonly occur after dialysis.  He notes this typically lasts for approximately 1-2 hours.  Denies vertigo-like symptoms. Over the last 2 days he has become dizzy with positional changes. He reports that since onset he has been  having melanous stools/diarrhea on 5-6 occassions. Patient reports that today he was straining and noticed some bloody hard stool as well.   HPI  Past Medical History:  Diagnosis Date  . Anemia   . Chronic combined systolic and diastolic CHF (congestive heart failure) ([place])   . Diabetes ([place])   . Diabetic wet gangrene of the foot ([place]) 09/04/2017  . ESRD (end stage renal disease) [facility])    [facility] [address] T, Th, Sat  . HIV disease ([place])   . Hyperparathyroidism, secondary ([place])   . Hypertension   . Hypertensive heart disease with end stage renal disease on dialysis ([place]) 03/13/2017  . Subacute osteomyelitis, right ankle and foot ([place])   . SVT (supraventricular tachycardia) ([place])    ? afib or atrial flutter s/p TEE/DCCV with subsequent ablation due to reoccurrence in [place]  . Type 2 diabetes mellitus [facility])     Patient Active Problem List   Diagnosis Date Noted  . Morbid obesity ([place])   . Fall   . Loose stools   . Benign essential HTN   . Subtherapeutic international normalized ratio (INR)   . History of left below knee amputation ([place]) 12/11/2017  . Hypoxia   . History of supraventricular tachycardia   . Chronic diastolic congestive heart failure ([place])   . S/P bilateral BKA (below knee amputation) ([place])   . Acute blood loss anemia   . Anemia of chronic disease   . Leukocytosis   . Post-operative pain   .  Non-ischemic cardiomyopathy ([place])   . Cutaneous abscess of right foot   . Hypokalemia 09/04/2017  . Essential hypertension 09/03/2017  . GERD (gastroesophageal reflux disease) 09/03/2017  . Diabetic foot ulcer ([place]) 09/03/2017  . GIB (gastrointestinal bleeding) 07/26/2017  . Anemia due to end stage renal disease ([place]) 07/26/2017  . PAF (paroxysmal atrial fibrillation) ([place]) 07/26/2017  . Symptomatic anemia 07/26/2017  . Fluid overload 05/05/2017  . Elevated troponin 05/05/2017  . Pulmonary edema 05/05/2017  . ESRD (end stage renal disease) ([place]) 05/05/2017  . HIV  disease ([place]) 03/25/2017  . Hypertensive heart disease with end stage renal disease on dialysis ([place]) 03/13/2017  . ESRD on dialysis ([place]) 03/13/2017  . Type 2 diabetes mellitus with complication ([place]) 32/99/2426  . SVT (supraventricular tachycardia) ([facility])     Past Surgical History:  Procedure Laterality Date  . AMPUTATION Left    foot  . AMPUTATION Right 12/06/2017   Procedure: AMPUTATION BELOW KNEE;  Surgeon: [name], MD;  Location: [facility];  Service: Orthopedics;  Laterality: Right;  . APPLICATION OF WOUND VAC Right 09/04/2017   Procedure: APPLICATION OF WOUND VAC;  Surgeon: [name], DPM;  Location: [facility];  Service: Podiatry;  Laterality: Right;  . APPLICATION OF WOUND VAC  12/06/2017   Procedure: APPLICATION OF WOUND VAC;  Surgeon: [name], MD;  Location: [facility];  Service: Orthopedics;;  . COLONOSCOPY WITH PROPOFOL N/A 07/30/2017   Procedure: COLONOSCOPY WITH PROPOFOL;  Surgeon: [name], MD;  Location: [facility];  Service: Endoscopy;  Laterality: N/A;  . ESOPHAGOGASTRODUODENOSCOPY (EGD) WITH PROPOFOL N/A 07/28/2017   Procedure: ESOPHAGOGASTRODUODENOSCOPY (EGD) WITH PROPOFOL;  Surgeon: [name], MD;  Location: [facility];  Service: Endoscopy;  Laterality: N/A;  . FLEXIBLE SIGMOIDOSCOPY N/A 07/27/2017   Procedure: FLEXIBLE SIGMOIDOSCOPY;  Surgeon: [name], MD;  Location: [facility] ENDOSCOPY;  Service: Endoscopy;  Laterality: N/A;  . GRAFT APPLICATION Right 83/41/9622   Procedure: SKIN GRAFT APPLICATION RIGHT FOOT;  Surgeon: [name], DPM;  Location: [facility] [place];  Service: Podiatry;  Laterality: Right;  . I&D EXTREMITY Right 09/04/2017   Procedure: IRRIGATION AND DEBRIDEMENT EXTREMITY;  Surgeon: [name], DPM;  Location: [place];  Service: Podiatry;  Laterality: Right;  . I&D EXTREMITY Right 11/12/2017   Procedure: IRRIGATION AND DEBRIDEMENT ULCER RIGHT FOOT;  Surgeon: [name], DPM;  Location: [facility];  Service: Podiatry;  Laterality: Right;    . RIGHT AV FISTULA PLACEMENT  10/19/2012  . WOUND DEBRIDEMENT N/A 09/24/2017   Procedure: DEBRIDEMENT WOUND;  Surgeon: [name], DPM;  Location: [facility];  Service: Podiatry;  Laterality: N/A;       Home Medications    Prior to Admission medications   Medication Sig Start Date End Date Taking? Authorizing Provider  amLODipine (NORVASC) 5 MG tablet Take 1 tablet (5 mg total) by mouth daily. 12/19/17   Angiulli, Lavon Paganini, PA-C  camphor[facility]) lotion Apply topically as needed for itching. 12/11/17   Colbert Ewing, MD  ferric gluconate 125 mg in sodium chloride 0.9 % 100 mL Inject 125 mg into the vein Every Tuesday,Thursday,and Saturday with dialysis. 05/10/17   Hosie Poisson, MD  fosamprenavir (LEXIVA) 700 MG tablet Take 700 mg by mouth 2 (two) times daily.     [provider]  gabapentin (NEURONTIN) 100 MG capsule Take 1 capsule (100 mg total) by mouth every dialysis. 01/09/18   Jamse Arn, MD  insulin glargine (LANTUS) 100 unit/mL SOPN Inject 0.3 mLs (30 Units total)  into the skin at bedtime. 12/19/17   Angiulli, Lavon Paganini, PA-C  lipase/protease/amylase (CREON) 12000 units CPEP capsule Take 1 capsule (12,000 Units total) by mouth 3 (three) times daily before meals. 12/19/17   Angiulli, Lavon Paganini, PA-C  lisinopril (PRINIVIL,ZESTRIL) 40 MG tablet Take 1 tablet (40 mg total) by mouth daily. 12/19/17   Angiulli, Lavon Paganini, PA-C  metoprolol tartrate (LOPRESSOR) 25 MG tablet Take 1 tablet (25 mg total) by mouth 2 (two) times daily. 12/19/17   Angiulli, Lavon Paganini, PA-C  multivitamin (RENA-VIT) TABS tablet Take 1 tablet by mouth at bedtime. 12/19/17   Angiulli, Lavon Paganini, PA-C  Oxycodone HCl 10 MG TABS Take 1 tablet (10 mg total) by mouth every 3 (three) hours as needed ((score 7 to 10)). 12/19/17   Angiulli, Lavon Paganini, PA-C  polycarbophil (FIBERCON) 625 MG tablet Take 1 tablet (625 mg total) by mouth daily. 12/19/17   Angiulli, Lavon Paganini, PA-C  raltegravir (ISENTRESS) 400 MG  tablet Take 400 mg by mouth 2 (two) times daily.    [provider]  ritonavir (NORVIR) 100 MG capsule Take 100 mg by mouth daily with breakfast.    [provider]  silver sulfADIAZINE (SILVADENE) 1 % cream Apply 1 application topically daily. 01/02/18   Suzan Slick, NP  warfarin (COUMADIN) 10 MG tablet Take 10 mg by mouth See admin instructions. Take 10 mg by mouth daily Sunday, Monday, Wednesday and Friday    [provider]  warfarin (COUMADIN) 7.5 MG tablet Take 7.5 mg by mouth See admin instructions. Take 7.5 mg by mouth daily on Tuesday, Thursday ans Saturday    [provider]    Family History Family History  Problem Relation Age of Onset  . Heart disease Mother   . Diabetes Mother   . Heart disease Father   . Cancer Neg Hx     Social History Social History   Tobacco Use  . Smoking status: Never Smoker  . Smokeless tobacco: Never Used  Substance Use Topics  . Alcohol use: No  . Drug use: No     Allergies   Patient has no known allergies.   Review of Systems Review of Systems  All other systems reviewed and are negative.    Physical Exam Updated Vital Signs BP 116/82   Pulse 95   Temp 98.6 F (37 C) (Oral)   Resp 14   Ht 6\' 6"  (1.981 m)   Wt 108.9 kg (240 lb)   SpO2 100%   BMI 27.73 kg/m   Physical Exam  Constitutional: He appears well-developed and well-nourished.  HENT:  Head: Normocephalic and atraumatic.  Right Ear: External ear normal.  Left Ear: External ear normal.  Nose: Nose normal.  Mouth/Throat: Uvula is midline, oropharynx is clear and moist and mucous membranes are normal. No tonsillar exudate.  Eyes: Pupils are equal, round, and reactive to light. Right eye exhibits no discharge. Left eye exhibits no discharge. No scleral icterus.  Neck: Trachea normal. Neck supple. No JVD present. No spinous process tenderness present. Carotid bruit is not present. No neck rigidity. Normal range of motion present.    Cardiovascular: Normal rate, regular rhythm and intact distal pulses.  No murmur heard. Pulses:      Radial pulses are 2+ on the right side, and 2+ on the left side.  Pulmonary/Chest: Effort normal and breath sounds normal. He exhibits no tenderness.  No increased work of breathing. No accessory muscle use. Patient is sitting upright, speaking in full  sentences without difficulty   Abdominal: Soft. Bowel sounds are normal. He exhibits no distension. There is no tenderness. There is no rigidity, no rebound, no guarding and no CVA tenderness. A hernia (reducible umbillical hernia) is present.  Genitourinary:  Genitourinary Comments: Chaperone was present.  Patient without pain around the rectal area. Perianal sensory intact. No external fissure's palpated or examined. No external hemorrhoids noted. No induration of the skin or swelling. Digital Rectal Exam reveals sphincter with good tone. No masses palpated. Stool color is melanous.  Musculoskeletal: He exhibits no edema.  Bilateral BKA. No edema or swelling.   Lymphadenopathy:    He has no cervical adenopathy.  Neurological: He is alert.  Speech clear. Follows commands. No facial droop. PERRLA. EOM grossly intact. CN III-XII grossly intact. Grossly moves all extremities 4 without ataxia. Able and appropriate strength for age to upper and lower extremities bilaterally including grip strength and hip flexion/extension. Normal finger to nose.   Skin: Skin is warm and dry. No rash noted. He is not diaphoretic.  Psychiatric: He has a normal mood and affect.  Nursing note and vitals reviewed.    ED Treatments / Results  Labs (all labs ordered are listed, but only abnormal results are displayed) Labs Reviewed  BASIC METABOLIC PANEL - Abnormal; Notable for the following components:      Result Value   Chloride 96 (*)    CO2 18 (*)    Glucose, Bld 209 (*)    BUN 163 (*)    Creatinine, Ser 9.98 (*)    Calcium 7.8 (*)    GFR calc non Af Amer  5 (*)    GFR calc Af Amer 6 (*)    Anion gap 23 (*)    All other components within normal limits  CBC - Abnormal; Notable for the following components:   WBC 10.7 (*)    RBC 1.64 (*)    Hemoglobin 5.1 (*)    HCT 15.6 (*)    RDW 23.8 (*)    All other components within normal limits  PROTIME-INR - Abnormal; Notable for the following components:   Prothrombin Time 32.0 (*)    All other components within normal limits  I-STAT TROPONIN, ED - Abnormal; Notable for the following components:   Troponin i, poc 0.12 (*)    All other components within normal limits  POC OCCULT BLOOD, ED - Abnormal; Notable for the following components:   Fecal Occult Bld POSITIVE (*)    All other components within normal limits  TROPONIN I  TROPONIN I  TROPONIN I  TYPE AND SCREEN  PREPARE RBC (CROSSMATCH)    EKG  EKG Interpretation  Date/Time:  Tuesday January 13 2018 10:18:21 EST Ventricular Rate:  96 PR Interval:    QRS Duration: 102 QT Interval:  396 QTC Calculation: 501 R Axis:   50 Text Interpretation:  Sinus rhythm Borderline prolonged PR interval ST elev, probable normal early repol pattern Prolonged QT interval Since last tracing QT has lengthened Confirmed by Daleen Bo [phone]) on 01/13/2018 10:30:21 AM       Radiology Dg Chest 2 View  Result Date: 01/13/2018 CLINICAL DATA:  52 year old male complaining of upper mid chest pain and dizziness for the past 3 days. EXAM: CHEST  2 VIEW COMPARISON:  Chest x-ray 12/06/2017. FINDINGS: Lung volumes are normal. No consolidative airspace disease. No pleural effusions. No pneumothorax. No pulmonary nodule or mass noted. Pulmonary vasculature and the cardiomediastinal silhouette are within normal limits. IMPRESSION: No radiographic evidence  of acute cardiopulmonary disease. Electronically Signed   By: Vinnie Langton M.D.   On: 01/13/2018 11:30    Procedures Procedures (including critical care time) CRITICAL CARE Performed by: Jillyn Ledger   Total critical care time: 45 minutes  Critical care time was exclusive of separately billable procedures and treating other patients.  Critical care was necessary to treat or prevent imminent or life-threatening deterioration.  Critical care was time spent personally by me on the following activities: development of treatment plan with patient and/or surrogate as well as nursing, discussions with consultants, evaluation of patient's response to treatment, examination of patient, obtaining history from patient or surrogate, ordering and performing treatments and interventions, ordering and review of laboratory studies, ordering and review of radiographic studies, pulse oximetry and re-evaluation of patient's condition.  Medications Ordered in ED Medications  sevelamer carbonate (RENVELA) tablet 4,000 mg (not administered)  cinacalcet (SENSIPAR) tablet 90 mg (not administered)  multivitamin (RENA-VIT) tablet 1 tablet (not administered)  phytonadione (VITAMIN K) tablet 10 mg (not administered)  pantoprazole (PROTONIX) 80 mg in sodium chloride 0.9 % 100 mL IVPB (not administered)  fosamprenavir (LEXIVA) tablet 700 mg (not administered)  gabapentin (NEURONTIN) capsule 100 mg (not administered)  lipase/protease/amylase (CREON) capsule 12,000 Units (not administered)  raltegravir (ISENTRESS) tablet 400 mg (not administered)  ritonavir (NORVIR) capsule 100 mg (not administered)  heparin injection 5,000 Units (not administered)  0.9 %  sodium chloride infusion ( Intravenous New Bag/Given 01/13/18 1453)     Initial Impression / Assessment and Plan / ED Course  I have reviewed the triage vital signs and the nursing notes.  Pertinent labs & imaging results that were available during my care of the patient were reviewed by me and considered in my medical decision making (see chart for details).     Thomas [name] is a 52 year old male with end-stage renal disease presenting with episodic  chest pain that started on 1/12 with most recent episode approximately 17 minutes into dialysis this morning.  The pain is non-positional, nonexertional, nonpleuritic.  There is associated tightness/shortness of breath, nausea and diaphoresis.  Patient also endorses dizziness with associated melena stools over the last 2 days.  No NSAID, alcohol or tobacco use.  Patient did receive 324 mg.  On arrival the patient is chest pain-free.  He is nondiaphoretic and in no acute distress.  Patient's heart rate is slightly tachycardic and his blood pressure is soft.  Vital signs otherwise reassuring.  He is noted to have bilateral below the knee amputations without any swelling.  He is without any focal neurologic deficits.  Patient with clear lungs bilaterally.  Heart regular rate and rhythm.  Abdomen is soft, nontender and nondistended.  Digital rectal exam reveals gross melena.  Labs reveal Tn at baseline from prior 0.12. Suspect this is likely related to renal disease. EKG without significant change. No evidence of ischemia. BUN elevated. Cr near baseline. PT-INR 3.13. Patient says he has been taking as directed. Anion gap noted of 23. No report ASA or Tylenol ingestion.  Hgb 5.1. This is down from 8.4 on 12/22. Fecal occult positive. Will administer blood transfusion. Risk vs benefits discussed. Patient will be admitted for symptomatic anemia and further workup.   Dr. Glennon Mac to admit the patient to internal medicine service. Dr. Jimmy Footman of nephrology to consult on the patient. Patient appears safe for admission. Patient case seen and discussed with Dr. Eulis Foster who is in agreement with plan.   Final Clinical Impressions(s) / ED Diagnoses  Final diagnoses:  Symptomatic anemia  End stage renal disease [facility])  Chest pain, unspecified type    ED Discharge Orders    None       Lorelle Gibbs 01/13/18 1558    Daleen Bo, MD 01/13/18 1618    Jillyn Ledger, PA-C 01/23/18 1914     Daleen Bo, MD 01/24/18 1205

## 2018-01-13 NOTE — ED Notes (Signed)
IV team at bedside assessing existing IV

## 2018-01-13 NOTE — ED Notes (Signed)
Date and time results received: 01/13/18 1208  Test: Hgb Critical Value: 5.1  Name of Provider Notified: Eulis Foster  Orders Received? Or Actions Taken?:

## 2018-01-13 NOTE — ED Notes (Signed)
Admitting at bedside 

## 2018-01-13 NOTE — ED Notes (Signed)
Updated pt that he is NPO at this point, pt yelling at this RN, "No, No I am god-damn not, I want to see the doctor, I want to see the doctor NOW" Asked Dr. Rodolph Bong about reason for order Dr Rodolph Bong stated he wasn't aware.   Dr. Danford Bad paged.

## 2018-01-13 NOTE — ED Provider Notes (Signed)
  Face-to-face evaluation   History: He presents for evaluation of intermittent chest pain since yesterday.  He has also noticed some blood in his stool.  Physical exam: Alert, calm and cooperative.  No respiratory distress.  No dysarthria or aphasia.   Medical screening examination/treatment/procedure(s) were conducted as a shared visit with non-physician practitioner(s) and myself.  I personally evaluated the patient during the encounter    Daleen Bo, MD 01/13/18 [phone]

## 2018-01-14 ENCOUNTER — Inpatient Hospital Stay (HOSPITAL_COMMUNITY): Payer: Medicare Other

## 2018-01-14 DIAGNOSIS — Z87898 Personal history of other specified conditions: Secondary | ICD-10-CM

## 2018-01-14 DIAGNOSIS — R791 Abnormal coagulation profile: Secondary | ICD-10-CM

## 2018-01-14 DIAGNOSIS — I4891 Unspecified atrial fibrillation: Secondary | ICD-10-CM

## 2018-01-14 DIAGNOSIS — R253 Fasciculation: Secondary | ICD-10-CM

## 2018-01-14 DIAGNOSIS — R000 Tachycardia, unspecified: Secondary | ICD-10-CM

## 2018-01-14 LAB — COMPREHENSIVE METABOLIC PANEL
ALK PHOS: 61 U/L (ref 38–126)
ALT: 14 U/L — ABNORMAL LOW (ref 17–63)
ANION GAP: 23 — AB (ref 5–15)
AST: 17 U/L (ref 15–41)
Albumin: 2.7 g/dL — ABNORMAL LOW (ref 3.5–5.0)
BILIRUBIN TOTAL: 1.4 mg/dL — AB (ref 0.3–1.2)
BUN: 189 mg/dL — ABNORMAL HIGH (ref 6–20)
CALCIUM: 7.8 mg/dL — AB (ref 8.9–10.3)
CO2: 17 mmol/L — ABNORMAL LOW (ref 22–32)
Chloride: 94 mmol/L — ABNORMAL LOW (ref 101–111)
Creatinine, Ser: 11.32 mg/dL — ABNORMAL HIGH (ref 0.61–1.24)
GFR calc non Af Amer: 5 mL/min — ABNORMAL LOW (ref >60)
GFR, EST AFRICAN AMERICAN: 5 mL/min — AB (ref >60)
Glucose, Bld: 200 mg/dL — ABNORMAL HIGH (ref 65–99)
Potassium: 5.1 mmol/L (ref 3.5–5.1)
SODIUM: 134 mmol/L — AB (ref 135–145)
TOTAL PROTEIN: 5.8 g/dL — AB (ref 6.5–8.1)

## 2018-01-14 LAB — BASIC METABOLIC PANEL
ANION GAP: 16 — AB (ref 5–15)
BUN: 75 mg/dL — AB (ref 6–20)
CO2: 25 mmol/L (ref 22–32)
Calcium: 8 mg/dL — ABNORMAL LOW (ref 8.9–10.3)
Chloride: 98 mmol/L — ABNORMAL LOW (ref 101–111)
Creatinine, Ser: 5.54 mg/dL — ABNORMAL HIGH (ref 0.61–1.24)
GFR, EST AFRICAN AMERICAN: 12 mL/min — AB (ref >60)
GFR, EST NON AFRICAN AMERICAN: 11 mL/min — AB (ref >60)
Glucose, Bld: 184 mg/dL — ABNORMAL HIGH (ref 65–99)
POTASSIUM: 3.5 mmol/L (ref 3.5–5.1)
SODIUM: 139 mmol/L (ref 135–145)

## 2018-01-14 LAB — CBC
HCT: 21.5 % — ABNORMAL LOW (ref 39.0–52.0)
HEMATOCRIT: 15.9 % — AB (ref 39.0–52.0)
HEMOGLOBIN: 5.4 g/dL — AB (ref 13.0–17.0)
HEMOGLOBIN: 7.4 g/dL — AB (ref 13.0–17.0)
MCH: 30.5 pg (ref 26.0–34.0)
MCH: 30.8 pg (ref 26.0–34.0)
MCHC: 34 g/dL (ref 30.0–36.0)
MCHC: 34.4 g/dL (ref 30.0–36.0)
MCV: 89.8 fL (ref 78.0–100.0)
MCV: 89.6 fL (ref 78.0–100.0)
Platelets: 166 10*3/uL (ref 150–400)
Platelets: 148 10*3/uL — ABNORMAL LOW (ref 150–400)
RBC: 1.77 MIL/uL — ABNORMAL LOW (ref 4.22–5.81)
RBC: 2.4 MIL/uL — AB (ref 4.22–5.81)
RDW: 23.9 % — ABNORMAL HIGH (ref 11.5–15.5)
RDW: 22.6 % — ABNORMAL HIGH (ref 11.5–15.5)
WBC: 10.3 10*3/uL (ref 4.0–10.5)
WBC: 8.4 10*3/uL (ref 4.0–10.5)

## 2018-01-14 LAB — TROPONIN I
TROPONIN I: 0.08 ng/mL — AB (ref <0.03)
TROPONIN I: 0.06 ng/mL — AB (ref <0.03)

## 2018-01-14 LAB — PREPARE RBC (CROSSMATCH)

## 2018-01-14 LAB — PROTIME-INR
INR: 1.68
Prothrombin Time: 19.6 seconds — ABNORMAL HIGH (ref 11.4–15.2)

## 2018-01-14 LAB — GLUCOSE, CAPILLARY
GLUCOSE-CAPILLARY: 153 mg/dL — AB (ref 65–99)
GLUCOSE-CAPILLARY: 155 mg/dL — AB (ref 65–99)
Glucose-Capillary: 242 mg/dL — ABNORMAL HIGH (ref 65–99)

## 2018-01-14 LAB — MRSA PCR SCREENING: MRSA BY PCR: NEGATIVE

## 2018-01-14 LAB — LACTATE DEHYDROGENASE: LDH: 142 U/L (ref 98–192)

## 2018-01-14 LAB — PHOSPHORUS: Phosphorus: 5.5 mg/dL — ABNORMAL HIGH (ref 2.5–4.6)

## 2018-01-14 LAB — HEMOGLOBIN AND HEMATOCRIT, BLOOD
HCT: 22.4 % — ABNORMAL LOW (ref 39.0–52.0)
Hemoglobin: 7.6 g/dL — ABNORMAL LOW (ref 13.0–17.0)

## 2018-01-14 LAB — APTT: APTT: 34 s (ref 24–36)

## 2018-01-14 MED ORDER — FOSAMPRENAVIR CALCIUM 700 MG PO TABS
700.0000 mg | ORAL_TABLET | Freq: Two times a day (BID) | ORAL | Status: DC
  Administered 2018-01-15 – 2018-01-16 (×2): 700 mg via ORAL
  Filled 2018-01-14 (×3): qty 1

## 2018-01-14 MED ORDER — ALBUMIN HUMAN 25 % IV SOLN
INTRAVENOUS | Status: AC
  Administered 2018-01-14: 25 g via INTRAVENOUS
  Filled 2018-01-14: qty 100

## 2018-01-14 MED ORDER — SODIUM CHLORIDE 0.9 % IV SOLN: 100.0000 mL | INTRAVENOUS | Status: DC | PRN

## 2018-01-14 MED ORDER — LORAZEPAM 2 MG/ML IJ SOLN
0.5000 mg | Freq: Once | INTRAMUSCULAR | Status: AC
  Administered 2018-01-14: 0.5 mg via INTRAVENOUS
  Filled 2018-01-14: qty 1

## 2018-01-14 MED ORDER — INSULIN GLARGINE 100 UNIT/ML ~~LOC~~ SOLN
20.0000 [IU] | Freq: Every day | SUBCUTANEOUS | Status: DC
  Administered 2018-01-14 – 2018-01-15 (×2): 20 [IU] via SUBCUTANEOUS
  Filled 2018-01-14 (×3): qty 0.2

## 2018-01-14 MED ORDER — HYDROXYZINE HCL 25 MG PO TABS
12.5000 mg | ORAL_TABLET | Freq: Once | ORAL | Status: AC | PRN
  Administered 2018-01-15: 12.5 mg via ORAL
  Filled 2018-01-14: qty 1

## 2018-01-14 MED ORDER — SODIUM CHLORIDE 0.9 % IV SOLN
Freq: Once | INTRAVENOUS | Status: AC
  Administered 2018-01-15: 11:00:00 via INTRAVENOUS

## 2018-01-14 MED ORDER — ALBUMIN HUMAN 25 % IV SOLN
25.0000 g | Freq: Once | INTRAVENOUS | Status: AC
Start: 2018-01-14 — End: 2018-01-14
  Administered 2018-01-14: 25 g via INTRAVENOUS
  Filled 2018-01-14: qty 100

## 2018-01-14 MED ORDER — LIDOCAINE-PRILOCAINE 2.5-2.5 % EX CREA
1.0000 "application " | TOPICAL_CREAM | CUTANEOUS | Status: DC | PRN
  Filled 2018-01-14: qty 5

## 2018-01-14 MED ORDER — PENTAFLUOROPROP-TETRAFLUOROETH EX AERO
1.0000 "application " | INHALATION_SPRAY | CUTANEOUS | Status: DC | PRN
  Filled 2018-01-14: qty 30

## 2018-01-14 MED ORDER — LIDOCAINE HCL (PF) 1 % IJ SOLN: 5.0000 mL | INTRAMUSCULAR | Status: DC | PRN

## 2018-01-14 MED ORDER — INSULIN ASPART 100 UNIT/ML ~~LOC~~ SOLN
0.0000 [IU] | Freq: Three times a day (TID) | SUBCUTANEOUS | Status: DC
  Administered 2018-01-14: 3 [IU] via SUBCUTANEOUS
  Administered 2018-01-15: 2 [IU] via SUBCUTANEOUS
  Administered 2018-01-16: 5 [IU] via SUBCUTANEOUS
  Administered 2018-01-16: 2 [IU] via SUBCUTANEOUS

## 2018-01-14 MED ORDER — ACETAMINOPHEN 500 MG PO TABS
1000.0000 mg | ORAL_TABLET | Freq: Once | ORAL | Status: AC | PRN
  Administered 2018-01-14: 1000 mg via ORAL
  Filled 2018-01-14: qty 2

## 2018-01-14 NOTE — Procedures (Signed)
ELECTROENCEPHALOGRAM REPORT  Date of Study: 01/14/2018  Patient's Name: Thomas [name] MRN: [id number] Date of Birth: 11/12/1966  Referring Provider: Dr. [name]  Clinical History: This is a 52 year old man with full body twitching episodes  Medications: Neurontin Insulin Ritonavir Sevelamer Lexiva Isentress  Technical Summary: A multichannel digital EEG recording measured by the international 10-20 system with electrodes applied with paste and impedances below 5000 ohms performed as portable with EKG monitoring in an awake and restless patient.  Hyperventilation and photic stimulation were not performed.  The digital EEG was referentially recorded, reformatted, and digitally filtered in a variety of bipolar and referential montages for optimal display.   Description: The patient is awake and restless during the recording.  During maximal wakefulness, there is a symmetric, medium voltage 7 Hz posterior dominant rhythm that attenuates with eye opening. This is admixed with a moderate amount of diffuse 4-5 Hz theta and occasional diffuse 2-3 Hz delta slowing of the waking background.  Sleep is not captured. Hyperventilation and photic stimulation were not performed. There were several events captured with patient noted to have body twitching, irregular jerking of right arm, back arching/stiffening of upper body, with no associated epileptiform correlate.  There were no epileptiform discharges or electrographic seizures seen.    EKG lead showed sinus tachycardia.  Impression: This awake EEG is abnormal due to moderate diffuse slowing of the waking background.  Clinical Correlation of the above findings indicates diffuse cerebral dysfunction that is non-specific in etiology and can be seen with hypoxic/ischemic injury, toxic/metabolic encephalopathies, neurodegenerative disorders, or medication effect. Episodes of body twitching, right arm jerking, stiffening, did not show epileptiform  correlate. The absence of epileptiform discharges does not rule out a clinical diagnosis of epilepsy.  Clinical correlation is advised.   Ellouise Newer, M.D.

## 2018-01-14 NOTE — Progress Notes (Signed)
Paged by nursing that patient and family wanted to discuss EEG results. Went to discuss EEG results and explained to patient and family that the EEG did not show seizure activity correlated with muscle twitching, so likely not a seizure. Patient states his symptoms are improving. Family seems concerned that this may be a medication reaction or reaction to transfusions. Told family that it is unclear what is causing his symptoms at this time but it is reassuring that they are improving, unlikely med or transfusion reaction. Reviewed labs with family and discussed that electrolytes were WNL and we would continue to monitor closely. Patient and significant other were satisfied with discussion.   Arvil Chaco, MD Internal Medicine PGY1

## 2018-01-14 NOTE — Progress Notes (Signed)
[facility] Progress Note   Subjective:   Seen while on HD. Major complaint is difficultly sleeping, "feels like he jerks awake with teeth chattering". Denies chills, CP, SOB, n/v/d, and dizziness.  Objective Vitals:   01/14/18 0530 01/14/18 0600 01/14/18 0630 01/14/18 0730  BP: (!) 151/135 107/73 115/67 (!) 88/58  Pulse: 71 92 (!) 124 (!) 121  Resp: 11 17 (!) 21 14  Temp:      TempSrc:      SpO2: 100% 100%    Weight:      Height:       Physical Exam General:NAD, tired  Heart: irregular rate and rhythm, 2/6 MR, no rub or gallop appreciated Lungs: CTAB, nml WOB decreased bx Abdomen: obese, soft, NTND, no masses, +BS liver down 6 cm Extremities:b/l BKA, no edema, right LE in sock Dialysis Access: RU AVF, cannulated   Filed Weights   01/13/18 1020 01/14/18 0420  Weight: 108.9 kg (240 lb) 119 kg (262 lb 5.6 oz)    Intake/Output Summary (Last 24 hours) at 01/14/2018 0755 Last data filed at 01/13/2018 2244 Gross per 24 hour  Intake 808.5 ml  Output -  Net 808.5 ml    Additional Objective Labs: Basic Metabolic Panel: Recent Labs  Lab 01/13/18 1053 01/14/18 0332  NA 137 134*  K 4.7 5.1  CL 96* 94*  CO2 18* 17*  GLUCOSE 209* 200*  BUN 163* 189*  CREATININE 9.98* 11.32*  CALCIUM 7.8* 7.8*  PHOS  --  5.5*   Liver Function Tests: Recent Labs  Lab 01/14/18 0332  AST 17  ALT 14*  ALKPHOS 61  BILITOT 1.4*  PROT 5.8*  ALBUMIN 2.7*   CBC: Recent Labs  Lab 01/13/18 1053 01/14/18 0332  WBC 10.7* 10.3  HGB 5.1* 5.4*  HCT 15.6* 15.9*  MCV 95.1 89.8  PLT 205 166   Cardiac Enzymes: Recent Labs  Lab 01/14/18 0123  TROPONINI 0.08*   Lab Results  Component Value Date   INR 3.13 01/13/2018   INR 1.99 12/20/2017   INR 1.77 12/19/2017   Studies/Results: Dg Chest 2 View  Result Date: 01/13/2018 CLINICAL DATA:  52 year old male complaining of upper mid chest pain and dizziness for the past 3 days. EXAM: CHEST  2 VIEW COMPARISON:  Chest x-ray  12/06/2017. FINDINGS: Lung volumes are normal. No consolidative airspace disease. No pleural effusions. No pneumothorax. No pulmonary nodule or mass noted. Pulmonary vasculature and the cardiomediastinal silhouette are within normal limits. IMPRESSION: No radiographic evidence of acute cardiopulmonary disease. Electronically Signed   By: Vinnie Langton M.D.   On: 01/13/2018 11:30    Medications: . sodium chloride    . sodium chloride    . sodium chloride    . pantoprazole (PROTONIX) IV Stopped (01/13/18 2220)   . cinacalcet  90 mg Oral Q supper  . fosamprenavir  700 mg Oral BID  . [START ON 01/15/2018] gabapentin  100 mg Oral Q T,Th,Sa-HD  . lipase/protease/amylase  12,000 Units Oral TID AC  . multivitamin  1 tablet Oral QHS  . raltegravir  400 mg Oral BID  . ritonavir  100 mg Oral Q breakfast  . sevelamer carbonate  4,000 mg Oral TID WC    Dialysis Orders: TTS  - NW  4.25 hrs, BFR 500, DFR  800,  EDW 115.5kg, 3K/ 2.25Ca   Access: RU AVF  Heparin None mircera 262mcg IV q 2wks - last 01/01/18 hectorol 49mcg IV qHD  Last Labs: 01/08/18 Hgb 10.1, Ca 8.4, P  6.3, PTH 1056, Albumin 3.7, 12/27:TSAT 39%   Assessment/Plan: 1. Symptomatic anemia 2/2 GIB/Rectal bleeding - h/o Mallory Weis Tear/ischemic colitis - +FOBT, Hgb 5.1>5.4 s/p 2Unit [facility] on 1/15, receiving 2Unit today during HD, and 233mcg Aranesp qwk 1st dose today. GI following.  Etio not clear, suspect esophagus 2. Chest pain/SOB - improved. Follow. 3. ESRD - Receiving HD this AM. Resume regular TTS schedule tomorrow. K 5.1. NO HEPARIN. 4. Secondary hyperparathyroidism - Cor Ca 8.9. P 5.5. Continue VDRA, binders, and sensipar.  5. Hypotension/volume - BP soft, given Albumin 25% 25G due to hypotension, repeat q1hr while on HD if needed. 4kg over EDW, titrate down volume as tolerated.  6. Nutrition - Renal diet with fluid restriction. ALb 2.7, Renavite. Nepro TID. 7. DM - per primary 8. B/l BKA - per primary 9. Sleep disturbance  - possibly med related, Ambien stopped, per primary. 10. HIV - per primary/ID 11. A fib - Warfarin held d/t possible GIB, metoprolol held d/t hypotension. In A fib, rate stable around 120s.     Jen Mow, PA-C [facility] Pager: [phone] 01/14/2018,7:55 AM  LOS: 1 day  I have seen and examined this patient and agree with the plan of care seen, eval, examined, discussed with primary, extender.  Jeneen Rinks Celestina Gironda 01/14/2018, 2:01 PM

## 2018-01-14 NOTE — Progress Notes (Signed)
Upon arrival to the unit, around 1000, the pt. Was experiencing moderate to severe "jerking" movements. IMTS paged and came up to 57m to look at the patient. Orders for multiple labs to be drawn as well as an EEG.

## 2018-01-14 NOTE — Progress Notes (Signed)
PT Cancellation Note  Patient Details Name: Thomas [name] MRN: [id number] DOB: 07/02/1966   Cancelled Treatment:    Reason Eval/Treat Not Completed: Patient at procedure or test/unavailable. Pt currently in HD. Will check back as schedule allows to attempt PT evaluation.   Thelma Comp 01/14/2018, 7:15 AM   Rolinda Roan, PT, DPT Acute Rehabilitation Services Pager: [phone]

## 2018-01-14 NOTE — Progress Notes (Signed)
Internal Medicine Attending:   I saw and examined the patient. I reviewed the resident's note and I agree with the resident's findings and plan as documented in the resident's note.  52 year old man admitted with acute blood loss anemia that may be due to upper gi bleeding, ischemic colitis, or other and was worsened by slightly supertherapeutic INR. He seems to have stopped bleeding with no further stool output overnight. His Hgb is 7.6g this morning and INR 1.6 after vitamin K. We are still holding coumadin and treating with IV PPI bid. GI recommends medical management only for now.

## 2018-01-14 NOTE — Progress Notes (Signed)
Clinically it appears that the patient has stopped bleeding. I discussed his case earlier today with the primary team. Because of his ongoing need for anticoagulation they have requested EGD just to make sure we are not dealing with anything significant at this time. I discussed this with the patient and we will set him up for tomorrow.

## 2018-01-14 NOTE — Progress Notes (Signed)
EEG complete - results pending 

## 2018-01-14 NOTE — Procedures (Signed)
I was present at this session.  I have reviewed the session itself and made appropriate changes.  HD  Delayed with staff issues.  Unfortunate stuck with 2 needles up despite orders, so recirc. Bp 90s. Thomas [name] 1/16/20198:15 AM

## 2018-01-14 NOTE — Progress Notes (Signed)
HD tx initiated via 15Gx2, both sites were still oozing blood from previous stick sites when I removed the bandages, both sites ooze through dsg of new stick sites as well, they pull/push/flushe well, VSS but w/ soft bp, obtained verbal order from Dr. Moshe Cipro for Albumin 25% 25G admin and may repeat Q1hr on HD tx if needed for hypotension and help to keep bp up in order to remove fluid, will cont to monitor until I hand off pt to dayshift HD nurse,

## 2018-01-14 NOTE — Progress Notes (Signed)
   Subjective: Patient states that he continues to feel fatigued and experienced chest pain today. He had one episode of melena last night but has had no further melena. We discussed we are continuing to monitor his hemoglobin. He is also experiencing right facial twitching that he cannot control. Spoke with nephrology who noticed this on admission. Discussed the case with GI as outlined below. Discussed plan with patient. All questions and concerns addressed.  Objective: Vital signs in last 24 hours: Vitals:   01/14/18 0330 01/14/18 0420 01/14/18 0435 01/14/18 0500  BP: 128/67 (!) 81/62 (!) 94/59 109/74  Pulse: (!) 109 86 (!) 105 (!) 107  Resp: 16 17 16 16   Temp:  98 F (36.7 C)    TempSrc:  Oral    SpO2: 99% 100% 100% 100%  Weight:  262 lb 5.6 oz (119 kg)    Height:       General: Obese male with noticeable right facial twitching Pulm: Good air movement with no wheezing or crackles CV: Tachycardic, irregular, no murmurs or rubs Abdomen: Active bowel sounds, soft, nondistended, no tenderness palpation Extremities: Bilateral below the knee amputation Neuro: Alert and oriented x3. Intermittent right facial twitching. Patient is alert and conversant during these episodes. Cranial nerves intact bilaterally. Gross strength 5 out of 5 in upper extremities. Sensation to light touch intact.  Assessment/Plan:  Thomas [name] is a 52 year old male with ESRD, hypertension, atrial fibrillation status post ablation currently on Coumadin, and history of Mallory-Weiss tear/ischemic colitis who presented to the ED with symptomatic anemia. He continues to be symptomatic this morning with fatigue and shortness of breath.  Symptomatic anemia secondary to possible upper GI bleed. - Patient presented with chest pain, shortness of breath, and dizziness.  Endorses a 2-day history of melena. - Hemoglobin initially 5.1 down from a baseline of approximately 8.  Initially received 2 units of packed red blood cells  but hemoglobin only minimally improved to 5.4.  Patient subsequently received 2 more units of packed red blood cells during hemodialysis this morning. - Vitamin K p.o. yesterday - Continue IV PPI 80 mg twice daily, will transition to p.o. starting tomorrow - INR and hemoglobin pending  - Gastroenterology consulted. Appreciate recommendations. Per GI will consider upper endoscopy once INR is normalized.  Right facial twitching. - Patient denies a history of right facial twitching in the past. 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 - Checking EEG and ionized calcium. Pending EEG may need to get neurology involved  Troponinemia - EKG unremarkable - Troponin trend:  0.12 -> 0.08 - Likely elevated in the setting of anemia and ESRD  Insomnia  - Avoid Ambien - Trial of hydroxyzine or trazodone if needed.   A. fib status post ablation - Appears to be in A. fib. Repeat EKG - Hold warfarin - Hold metoprolol  ESRD on hemodialysis - Went for HD yesterday  - Nephrology consulted  Hypertension - Home medications include amlodipine, lisinopril, and metoprolol. Will hold all in the setting of acute GI bleed.  HIV - Continue fosamprenavir, raltegravir, and ritonavir  Diabetes mellitus - Continue Lantus 20 units nightly + sliding scale with meals once patient able to eat  Dispo: Anticipated discharge pending clinical improvement.   [name], MD 01/14/2018, 5:32 AM My Pager: [phone]

## 2018-01-14 NOTE — Progress Notes (Signed)
Inpatient Diabetes Program Recommendations  AACE/ADA: New Consensus Statement on Inpatient Glycemic Control (2015)  Target Ranges:  Prepandial:   less than 140 mg/dL      Peak postprandial:   less than 180 mg/dL (1-2 hours)      Critically ill patients:  140 - 180 mg/dL   Lab Results  Component Value Date   GLUCAP 153 (H) 01/14/2018   HGBA1C 7.7 (H) 09/24/2017    Review of Glycemic ControlResults for STIRLING, ORTON (MRN [id number]) as of 01/14/2018 11:29  Ref. Range 01/14/2018 10:59  Glucose-Capillary Latest Ref Range: 65 - 99 mg/dL 153 (H)   Diabetes history: Type 2 DM- Outpatient Diabetes medications:  Lantus 30 units q HS Current orders for Inpatient glycemic control:  None Inpatient Diabetes Program Recommendations:   Paged MD.  Patient needs orders for insulin/CBG checks while in the hospital.   Thanks,  Adah Perl, RN, BC-ADM Inpatient Diabetes Coordinator Pager [phone] (8a-5p)

## 2018-01-15 ENCOUNTER — Inpatient Hospital Stay (HOSPITAL_COMMUNITY): Payer: Medicare Other | Admitting: Certified Registered Nurse Anesthetist

## 2018-01-15 ENCOUNTER — Encounter (HOSPITAL_COMMUNITY)
Admission: EM | Disposition: A | Discharge status: Home / Self Care | Payer: Self-pay | Source: Home / Self Care | Attending: Student in an Organized Health Care Education/Training Program

## 2018-01-15 DIAGNOSIS — G546 Phantom limb syndrome with pain: Secondary | ICD-10-CM

## 2018-01-15 HISTORY — PX: ESOPHAGOGASTRODUODENOSCOPY (EGD) WITH PROPOFOL: SHX5813

## 2018-01-15 LAB — CBC
HEMATOCRIT: 19.9 % — AB (ref 39.0–52.0)
HEMOGLOBIN: 6.7 g/dL — AB (ref 13.0–17.0)
MCH: 30.5 pg (ref 26.0–34.0)
MCHC: 33.7 g/dL (ref 30.0–36.0)
MCV: 90.5 fL (ref 78.0–100.0)
Platelets: 170 10*3/uL (ref 150–400)
RBC: 2.2 MIL/uL — AB (ref 4.22–5.81)
RDW: 22.9 % — AB (ref 11.5–15.5)
WBC: 8.7 10*3/uL (ref 4.0–10.5)

## 2018-01-15 LAB — GLUCOSE, CAPILLARY
GLUCOSE-CAPILLARY: 144 mg/dL — AB (ref 65–99)
GLUCOSE-CAPILLARY: 149 mg/dL — AB (ref 65–99)
GLUCOSE-CAPILLARY: 210 mg/dL — AB (ref 65–99)

## 2018-01-15 LAB — IRON AND TIBC
Iron: 47 ug/dL (ref 45–182)
Saturation Ratios: 25 % (ref 17.9–39.5)
TIBC: 192 ug/dL — ABNORMAL LOW (ref 250–450)
UIBC: 145 ug/dL

## 2018-01-15 LAB — BASIC METABOLIC PANEL
ANION GAP: 14 (ref 5–15)
BUN: 102 mg/dL — AB (ref 6–20)
CHLORIDE: 100 mmol/L — AB (ref 101–111)
CO2: 25 mmol/L (ref 22–32)
Calcium: 8.6 mg/dL — ABNORMAL LOW (ref 8.9–10.3)
Creatinine, Ser: 7.88 mg/dL — ABNORMAL HIGH (ref 0.61–1.24)
GFR, EST AFRICAN AMERICAN: 8 mL/min — AB (ref >60)
GFR, EST NON AFRICAN AMERICAN: 7 mL/min — AB (ref >60)
Glucose, Bld: 162 mg/dL — ABNORMAL HIGH (ref 65–99)
POTASSIUM: 4.1 mmol/L (ref 3.5–5.1)
Sodium: 139 mmol/L (ref 135–145)

## 2018-01-15 LAB — MAGNESIUM: MAGNESIUM: 2.2 mg/dL (ref 1.7–2.4)

## 2018-01-15 LAB — PROTIME-INR
INR: 1.25
Prothrombin Time: 15.6 seconds — ABNORMAL HIGH (ref 11.4–15.2)

## 2018-01-15 LAB — CALCIUM, IONIZED: CALCIUM, IONIZED, SERUM: 4.4 mg/dL — AB (ref 4.5–5.6)

## 2018-01-15 LAB — HAPTOGLOBIN: HAPTOGLOBIN: 86 mg/dL (ref 34–200)

## 2018-01-15 LAB — PREPARE RBC (CROSSMATCH)

## 2018-01-15 LAB — HEMOGLOBIN AND HEMATOCRIT, BLOOD
HCT: 23.7 % — ABNORMAL LOW (ref 39.0–52.0)
Hemoglobin: 8 g/dL — ABNORMAL LOW (ref 13.0–17.0)

## 2018-01-15 LAB — PARATHYROID HORMONE, INTACT (NO CA): PTH: 750 pg/mL — AB (ref 15–65)

## 2018-01-15 SURGERY — ESOPHAGOGASTRODUODENOSCOPY (EGD) WITH PROPOFOL
Anesthesia: Monitor Anesthesia Care

## 2018-01-15 MED ORDER — LOPERAMIDE HCL 2 MG PO CAPS: 2.0000 mg | ORAL_CAPSULE | Freq: Once | ORAL | Status: DC

## 2018-01-15 MED ORDER — LIDOCAINE-PRILOCAINE 2.5-2.5 % EX CREA: 1.0000 "application " | TOPICAL_CREAM | CUTANEOUS | Status: DC | PRN

## 2018-01-15 MED ORDER — HEPARIN SODIUM (PORCINE) 1000 UNIT/ML DIALYSIS
1000.0000 [IU] | INTRAMUSCULAR | Status: DC | PRN
  Filled 2018-01-15: qty 1

## 2018-01-15 MED ORDER — SODIUM CHLORIDE 0.9 % IV SOLN: Freq: Once | INTRAVENOUS | Status: DC

## 2018-01-15 MED ORDER — PENTAFLUOROPROP-TETRAFLUOROETH EX AERO: 1.0000 "application " | INHALATION_SPRAY | CUTANEOUS | Status: DC | PRN

## 2018-01-15 MED ORDER — SODIUM CHLORIDE 0.9 % IV SOLN: 100.0000 mL | INTRAVENOUS | Status: DC | PRN

## 2018-01-15 MED ORDER — SODIUM CHLORIDE 0.9 % IV SOLN: INTRAVENOUS | Status: DC

## 2018-01-15 MED ORDER — IBUPROFEN 200 MG PO TABS
400.0000 mg | ORAL_TABLET | Freq: Two times a day (BID) | ORAL | Status: DC | PRN
  Administered 2018-01-15 – 2018-01-16 (×2): 400 mg via ORAL
  Filled 2018-01-15: qty 1
  Filled 2018-01-15: qty 2
  Filled 2018-01-15 (×2): qty 1

## 2018-01-15 MED ORDER — LIDOCAINE 2% (20 MG/ML) 5 ML SYRINGE
INTRAMUSCULAR | Status: DC | PRN
  Administered 2018-01-15: 40 mg via INTRAVENOUS

## 2018-01-15 MED ORDER — ALTEPLASE 2 MG IJ SOLR: 2.0000 mg | Freq: Once | INTRAMUSCULAR | Status: DC | PRN

## 2018-01-15 MED ORDER — HEPARIN SODIUM (PORCINE) 5000 UNIT/ML IJ SOLN: 5000.0000 [IU] | Freq: Three times a day (TID) | INTRAMUSCULAR | Status: DC

## 2018-01-15 MED ORDER — TRAZODONE HCL 50 MG PO TABS: 50.0000 mg | ORAL_TABLET | Freq: Once | ORAL | Status: DC | PRN

## 2018-01-15 MED ORDER — PROPOFOL 500 MG/50ML IV EMUL
INTRAVENOUS | Status: DC | PRN
Start: 2018-01-15 — End: 2018-01-15
  Administered 2018-01-15: 150 ug/kg/min via INTRAVENOUS

## 2018-01-15 MED ORDER — CALCITRIOL 0.5 MCG PO CAPS
3.0000 ug | ORAL_CAPSULE | ORAL | Status: DC
  Administered 2018-01-15: 3 ug via ORAL
  Filled 2018-01-15: qty 6

## 2018-01-15 MED ORDER — TRAMADOL HCL 50 MG PO TABS
50.0000 mg | ORAL_TABLET | Freq: Once | ORAL | Status: AC
  Administered 2018-01-15: 50 mg via ORAL
  Filled 2018-01-15: qty 1

## 2018-01-15 MED ORDER — METOPROLOL TARTRATE 25 MG PO TABS
25.0000 mg | ORAL_TABLET | Freq: Two times a day (BID) | ORAL | Status: DC
  Administered 2018-01-15 – 2018-01-16 (×2): 25 mg via ORAL
  Filled 2018-01-15: qty 2
  Filled 2018-01-15: qty 1

## 2018-01-15 MED ORDER — LIDOCAINE HCL (PF) 1 % IJ SOLN: 5.0000 mL | INTRAMUSCULAR | Status: DC | PRN

## 2018-01-15 MED ORDER — CALCITRIOL 0.5 MCG PO CAPS
ORAL_CAPSULE | ORAL | Status: AC
  Administered 2018-01-15: 3 ug via ORAL
  Filled 2018-01-15: qty 6

## 2018-01-15 SURGICAL SUPPLY — 14 items

## 2018-01-15 NOTE — H&P (Signed)
The patient is a 52 year old male with multiple medical problems as described on his hospital H&P and my consult note previously. He is here in endoscopy today to have an EGD to determine if there is a significant abnormality in the upper GI tract which caused his gastrointestinal bleeding and would keep firm from being placed back on Coumadin.  Physical no distress  Heart regular rhythm  Lungs clear  Abdomen soft and nontender  Impression: Gastrointestinal bleeding  Plan: EGD

## 2018-01-15 NOTE — Progress Notes (Signed)
Patient arrived to unit per bed.  Reviewed treatment plan and this RN agrees.  Report received from bedside RN,Caitlin Foecking.  Consent verified.  Patient A & O X 4. Lung sounds diminished to ausculation in all fields. Generalized edema. Cardiac: ST.  Prepped RLAVF with alcohol and cannulated with two 15 gauge needles.  Pulsation of blood noted.  Flushed access well with saline per protocol.  Connected and secured lines and initiated tx at 1448.  UF goal of 3900 mL and net fluid removal of 3400 mL.  Will continue to monitor.

## 2018-01-15 NOTE — Evaluation (Signed)
Occupational Therapy Evaluation and Discharge Patient Details Name: Thomas [name] MRN: [id number] DOB: [birth year] Today's Date: 01/15/2018    History of Present Illness Thomas [name] is an 52 y.o. male with end-stage renal disease on hemodialysis and anticoagulation for his A. fib.  Patient came to the ED initially for shortness of breath and chest pain.  He was noted to have acute blood loss anemia with a hemoglobin of 5.4 on admission and an INR of 3.1. 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    Clinical Impression   PTA Pt mod A for ADL (bathing) independent with DME for all other ADL and transfers with prosthesis. Pt has wife home 24 hours a day for assist as needed. Pt at baseline for ADL and interested in assisting with Prosthetics support group and also connections on groups that adapt bathrooms for wheelchairs/accessibility. No further OT concerns, OT will sign off thank you for the opportunity to serve this patient.     Follow Up Recommendations  No OT follow up    Equipment Recommendations  None recommended by OT    Recommendations for Other Services       Precautions / Restrictions Precautions Precautions: Fall Required Braces or Orthoses: Other Brace/Splint Other Brace/Splint: Left LE prosthesis Restrictions Weight Bearing Restrictions: No      Mobility Bed Mobility Overal bed mobility: Independent                Transfers Overall transfer level: Needs assistance   Transfers: Squat Pivot Transfers     Squat pivot transfers: Supervision     General transfer comment: Pt able to transfer bed to recliner with left LE prosthesis on without physical assist just close stand by.     Balance Overall balance assessment: Needs assistance Sitting-balance support: No upper extremity supported;Feet supported Sitting balance-Leahy Scale: Good                                     ADL either performed or assessed with clinical  judgement   ADL Overall ADL's : At baseline                                       General ADL Comments: Pt reports assist from his wife for bathing while he is waiting on his bathroom renovation to be accessible by wheelchair. Pt was able to don prothesis independently, and has absolutely no questions or concerns for ADL     Vision         Perception     Praxis      Pertinent Vitals/Pain Pain Assessment: No/denies pain     Hand Dominance Right   Extremity/Trunk Assessment Upper Extremity Assessment Upper Extremity Assessment: Overall WFL for tasks assessed   Lower Extremity Assessment Lower Extremity Assessment: Defer to PT evaluation RLE Deficits / Details: BKA full ROM 4-/5 strength LLE Deficits / Details: End of residual limb with 2 areas of black eschar - pt states MD is aware.  He has a black sock over the limb.  Grossly 4-/5 strength and ROM WFL.   Cervical / Trunk Assessment Cervical / Trunk Assessment: Normal   Communication Communication Communication: No difficulties   Cognition Arousal/Alertness: Awake/alert Behavior During Therapy: WFL for tasks assessed/performed Overall Cognitive Status: Within Functional Limits for tasks assessed  General Comments       Exercises     Shoulder Instructions      Home Living Family/patient expects to be discharged to:: Private residence Living Arrangements: Spouse/significant other Available Help at Discharge: Family;Available 24 hours/day Type of Home: House Home Access: Ramped entrance     Home Layout: 1/2 bath on main level;Bed/bath upstairs;Two level Alternate Level Stairs-Number of Steps: 14 steps to second floor Alternate Level Stairs-Rails: (stair lift) Bathroom Shower/Tub: Tub/shower unit(upstairs)   Bathroom Toilet: Standard Bathroom Accessibility: No   Home Equipment: Wheelchair - manual;Bedside commode;Other (comment)(has a  wheelchair upstairs and one downstairs, sliding board)   Additional Comments: Pt is getting ready to renovate his bathroom.  Looking for contractors.  Also wants to know if there is an amputee support group in the area.       Prior Functioning/Environment Level of Independence: Independent with assistive device(s)        Comments: LLE prosthesis, dialysis 3x per week,Modif I with wheelchair, wife sponge bathes pt but he dresses himself. He can transfer himself but he states he calls his wife as well to stand by.        OT Problem List:        OT Treatment/Interventions:      OT Goals(Current goals can be found in the care plan section)    OT Frequency:     Barriers to D/C:            Co-evaluation PT/OT/SLP Co-Evaluation/Treatment: Yes Reason for Co-Treatment: Necessary to address cognition/behavior during functional activity;For patient/therapist safety;To address functional/ADL transfers PT goals addressed during session: Mobility/safety with mobility OT goals addressed during session: ADL's and self-care;Proper use of Adaptive equipment and DME      AM-PAC PT "6 Clicks" Daily Activity     Outcome Measure Help from another person eating meals?: None Help from another person taking care of personal grooming?: A Little Help from another person toileting, which includes using toliet, bedpan, or urinal?: None Help from another person bathing (including washing, rinsing, drying)?: A Lot Help from another person to put on and taking off regular upper body clothing?: None Help from another person to put on and taking off regular lower body clothing?: None 6 Click Score: 21   End of Session Equipment Utilized During Treatment: Gait belt;Other (comment)(LLE prothesis) Nurse Communication: Mobility status  Activity Tolerance: Patient tolerated treatment well Patient left: in chair;with call bell/phone within reach;with chair alarm set                   Time: 8242-3536 OT  Time Calculation (min): 29 min Charges:  OT General Charges $OT Visit: 1 Visit OT Evaluation $OT Eval Moderate Complexity: 1 Mod G-Codes:     Hulda Humphrey OTR/L [place] 01/15/2018, 5:15 PM

## 2018-01-15 NOTE — Care Management Note (Addendum)
Case Management Note  Patient Details  Name: Thomas [name] MRN: [id number] Date of Birth: 1966-03-31  Subjective/Objective:  From home with wife presents with ESRD, hypertension, atrial fibrillation status post ablation currently on Coumadin, and history of Mallory-Weiss tear/ischemic colitis who presented to the ED with symptomatic anemia.  1/18 [place], BSN - per pt/ot eval no f/u needed.                     Action/Plan: DC home today. No needs.  Expected Discharge Date:                  Expected Discharge Plan:     In-House Referral:     Discharge planning Services  CM Consult  Post Acute Care Choice:    Choice offered to:     DME Arranged:    DME Agency:     HH Arranged:    HH Agency:     Status of Service:  In process, will continue to follow  If discussed at Long Length of Stay Meetings, dates discussed:    Additional Comments:  Zenon Mayo, RN 01/15/2018, 2:52 PM

## 2018-01-15 NOTE — Anesthesia Postprocedure Evaluation (Signed)
Anesthesia Post Note  Patient: Thomas [name]  Procedure(s) Performed: ESOPHAGOGASTRODUODENOSCOPY (EGD) WITH PROPOFOL (N/A )     Patient location during evaluation: Endoscopy Anesthesia Type: MAC Level of consciousness: awake Pain management: pain level controlled Vital Signs Assessment: post-procedure vital signs reviewed and stable Respiratory status: spontaneous breathing Cardiovascular status: stable Anesthetic complications: no    Last Vitals:  Vitals:   01/15/18 1254 01/15/18 1300  BP: 124/84 (!) 127/94  Pulse: (!) 116 (!) 117  Resp: 17 16  Temp: 36.5 C   SpO2: 100% 100%    Last Pain:  Vitals:   01/15/18 1254  TempSrc: Oral  PainSc:                  Yoshio Seliga

## 2018-01-15 NOTE — Progress Notes (Addendum)
Paged that pt HR were sustaining 120-130s. Reviewed tele. Rate was irregular with PVCs. Per chart review - HD nursing documented the patient was in afib with RVR after HD. Heart rates last night were 100-115.   Plan: -Started metoprolol 25 mg BID -will continue to monitor

## 2018-01-15 NOTE — Progress Notes (Signed)
Internal Medicine Attending:   I saw and examined the patient. I reviewed the resident's note and I agree with the resident's findings and plan as documented in the resident's note.  One BM yesterday which was dark. No other obvious active bleeding seen. Hgb down again below 7, will transfuse another unit with HD today. EGD to eval for upper GI source of bleeding, and hopefully intervention. Still holding anticoagulation, but I would like to be able to restart it before discharge.

## 2018-01-15 NOTE — Op Note (Signed)
[facility] Patient Name: Thomas [name] Procedure Date : 01/15/2018 MRN: [id number] Attending MD: [name] , MD Date of Birth: 03/07/1966 CSN: [id number] Age: 52 Admit Type: Inpatient Procedure:                Upper GI endoscopy Indications:              Hematemesis, Melena Providers:                [name], MD, Cleda Daub, RN, William Dalton, Technician Referring MD:              Medicines:                Propofol per Anesthesia Complications:            No immediate complications. Estimated Blood Loss:     Estimated blood loss: none. Procedure:                Pre-Anesthesia Assessment:                           - Prior to the procedure, a History and Physical                            was performed, and patient medications and                            allergies were reviewed. The patient's tolerance of                            previous anesthesia was also reviewed. The risks                            and benefits of the procedure and the sedation                            options and risks were discussed with the patient.                            All questions were answered, and informed consent                            was obtained. Prior Anticoagulants: The patient has                            taken Coumadin (warfarin), last dose was 4 days                            prior to procedure. ASA Grade Assessment: III - A                            patient with severe systemic disease. After  reviewing the risks and benefits, the patient was                            deemed in satisfactory condition to undergo the                            procedure.                           After obtaining informed consent, the endoscope was                            passed under direct vision. Throughout the                            procedure, the patient's blood pressure, pulse, and             oxygen saturations were monitored continuously. The                            EG-[id number] ([id number]) scope was introduced through the                            mouth, and advanced to the second part of duodenum.                            The upper GI endoscopy was accomplished without                            difficulty. The patient tolerated the procedure                            well. Scope In: Scope Out: Findings:      A medium size deep non-bleeding Mallory-Weiss tear with stigmata of       recent bleeding was found.      No gross lesions were noted in the stomach. Some retained medication in       stomach.      The examined duodenum was normal. Impression:               - Mallory-Weiss tear.                           - No gross lesions in the stomach.                           - Normal examined duodenum.                           - No specimens collected. Moderate Sedation:      . Recommendation:           - Advance diet as tolerated.                           - Continue present medications.                           -  Would not resume Coumadin at this time given                            presence of Mallory weiss tear. Continue PPI. Procedure Code(s):        --- Professional ---                           [phone], Esophagogastroduodenoscopy, flexible,                            transoral; diagnostic, including collection of                            specimen(s) by brushing or washing, when performed                            (separate procedure) Diagnosis Code(s):        --- Professional ---                           K22.6, Gastro-esophageal laceration-hemorrhage                            syndrome                           K92.0, Hematemesis                           K92.1, Melena (includes Hematochezia) CPT copyright 2016 [employer]. All rights reserved. The codes documented in this report are preliminary and upon coder review may  be revised  to meet current compliance requirements. [name], MD 01/15/2018 12:46:21 PM This report has been signed electronically. Number of Addenda: 0

## 2018-01-15 NOTE — Progress Notes (Signed)
CRITICAL VALUE ALERT  Critical Value:  6.7 HGB  Date & Time Notied:  01/15/18 @ 3094  Provider Notified: Dr. Tarri Abernethy  Orders Received/Actions taken: Dr. Tarri Abernethy called back. No new orders received at this time. Will continue to monitor patient.

## 2018-01-15 NOTE — Anesthesia Preprocedure Evaluation (Addendum)
Anesthesia Evaluation  Patient identified by MRN, date of birth, ID band Patient awake    Reviewed: Allergy & Precautions, NPO status , Patient's Chart, lab work & pertinent test results  Airway Mallampati: II  TM Distance: >3 FB     Dental   Pulmonary    breath sounds clear to auscultation       Cardiovascular hypertension, +CHF   Rhythm:Regular Rate:Normal     Neuro/Psych    GI/Hepatic Neg liver ROS, GERD  ,  Endo/Other  diabetes  Renal/GU Renal disease     Musculoskeletal   Abdominal   Peds  Hematology  (+) anemia ,   Anesthesia Other Findings   Reproductive/Obstetrics                             Anesthesia Physical Anesthesia Plan  ASA: III  Anesthesia Plan: MAC   Post-op Pain Management:    Induction: Intravenous  PONV Risk Score and Plan: 2 and Treatment may vary due to age or medical condition  Airway Management Planned: Mask and Simple Face Mask  Additional Equipment:   Intra-op Plan:   Post-operative Plan:   Informed Consent: I have reviewed the patients History and Physical, chart, labs and discussed the procedure including the risks, benefits and alternatives for the proposed anesthesia with the patient or authorized representative who has indicated his/her understanding and acceptance.   Dental advisory given  Plan Discussed with: CRNA and Anesthesiologist  Anesthesia Plan Comments:         Anesthesia Quick Evaluation

## 2018-01-15 NOTE — Progress Notes (Signed)
Subjective: Interval History: has complaints still not resting.  Objective: Vital signs in last 24 hours: Temp:  [98.2 F (36.8 C)-98.8 F (37.1 C)] 98.4 F (36.9 C) (01/17 0804) Pulse Rate:  [47-127] 127 (01/17 0804) Resp:  [15-25] 25 (01/17 0804) BP: (111-133)/(52-101) 132/74 (01/17 0804) SpO2:  [98 %-100 %] 99 % (01/17 0426) Weight change: 8.037 kg (17 lb 11.5 oz)  Intake/Output from previous day: 01/16 0701 - 01/17 0700 In: 1286 [P.O.:240; Blood:946; IV Piggyback:100] Out: 2939  Intake/Output this shift: No intake/output data recorded.  General appearance: alert, cooperative, moderately obese and mild aggitation Resp: diminished breath sounds bilaterally Cardio: irregularly irregular rhythm and Rate 120s GI: obese, pos bs, soft Extremities: AVF R arm. pos b&t  Lab Results: Recent Labs    01/14/18 1525 01/15/18 0729  WBC 8.4 8.7  HGB 7.4* 6.7*  HCT 21.5* 19.9*  PLT 148* 170   BMET:  Recent Labs    01/14/18 1116 01/15/18 0729  NA 139 139  K 3.5 4.1  CL 98* 100*  CO2 25 25  GLUCOSE 184* 162*  BUN 75* 102*  CREATININE 5.54* 7.88*  CALCIUM 8.0* 8.6*   Recent Labs    01/14/18 1116  PTH 750*   Iron Studies: No results for input(s): IRON, TIBC, TRANSFERRIN, FERRITIN in the last 72 hours.  Studies/Results: Dg Chest 2 View  Result Date: 01/13/2018 CLINICAL DATA:  52 year old male complaining of upper mid chest pain and dizziness for the past 3 days. EXAM: CHEST  2 VIEW COMPARISON:  Chest x-ray 12/06/2017. FINDINGS: Lung volumes are normal. No consolidative airspace disease. No pleural effusions. No pneumothorax. No pulmonary nodule or mass noted. Pulmonary vasculature and the cardiomediastinal silhouette are within normal limits. IMPRESSION: No radiographic evidence of acute cardiopulmonary disease. Electronically Signed   By: Vinnie Langton M.D.   On: 01/13/2018 11:30    I have reviewed the patient's current medications.  Assessment/Plan: 1 ESRD HD  tomorrow, no hep 2 GIB per GI and primary, Hb stable, need eval as anticoag 3 DM controlled 4 anemia esa 5 HPTH. 6 Obesity 7 afib needs rate control, anticoag 8 PVD post amps 9 Aggitation ?? etio P Hd, esa, endo, rate control.,     LOS: 2 days   Thomas [name] Thomas [name] 01/15/2018,9:25 AM

## 2018-01-15 NOTE — Progress Notes (Signed)
Order noted to apply SCDs. Patient is a bilateral BKA. MD Helberg notified. Will continue to monitor.

## 2018-01-15 NOTE — Progress Notes (Signed)
Spoke with Dr. Genia Del who ordered 1 unit PRBC. Order received for patient to receive PRBC in dialysis later today since patient will be off unit this morning for EGD, and in dialysis this afternoon.

## 2018-01-15 NOTE — Evaluation (Signed)
Physical Therapy Evaluation Patient Details Name: Thomas [name] MRN: [id number] DOB: 1966-05-24 Today's Date: 01/15/2018   History of Present Illness  Thomas [name] is an 52 y.o. male with end-stage renal disease on hemodialysis and anticoagulation for his A- fib. Patient came to the ED initially for shortness of breath and chest pain. He was noted to have acute blood loss anemia with a hemoglobin of 5.4 on admission and an INR of 3.1. HD 3 x week.  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   Clinical Impression  Pt admitted with above diagnosis. Pt currently with functional limitations due to the deficits listed below (see PT Problem List). Pt functional mobility pretty good with pt able to be Modif I-Supervision from wheelchair at homeper pt.  He states his wife helps him a lot.  Will follow acutely to ensure mobility as well as assist pt with resources.  MD:  Noted 2 areas of black eschar on end of right residual limb - just wanted to make MD aware.  Pt wants right prosthesis but with the residual limb unhealed areas this may not be feasible.  May need HHRN? Please address.  Pt will benefit from skilled PT to increase their independence and safety with mobility to allow discharge to the venue listed below.      Follow Up Recommendations No PT follow up;Supervision/Assistance - 24 hour    Equipment Recommendations  None recommended by PT    Recommendations for Other Services       Precautions / Restrictions Precautions Precautions: Fall Required Braces or Orthoses: Other Brace/Splint Other Brace/Splint: Left LE prosthesis Restrictions Weight Bearing Restrictions: No      Mobility  Bed Mobility Overal bed mobility: Independent                Transfers Overall transfer level: Needs assistance   Transfers: Squat Pivot Transfers     Squat pivot transfers: Supervision     General transfer comment: Pt able to transfer bed to recliner with left LE  prosthesis on without physical assist just close stand by.   Ambulation/Gait                Stairs            Wheelchair Mobility    Modified Rankin (Stroke Patients Only)       Balance Overall balance assessment: Needs assistance Sitting-balance support: No upper extremity supported;Feet supported Sitting balance-Leahy Scale: Good                                       Pertinent Vitals/Pain Pain Assessment: No/denies pain    Home Living Family/patient expects to be discharged to:: Private residence Living Arrangements: Spouse/significant other Available Help at Discharge: Family;Available 24 hours/day Type of Home: House Home Access: Ramped entrance     Home Layout: 1/2 bath on main level;Bed/bath upstairs;Two level Home Equipment: Wheelchair - manual;Bedside commode;Other (comment)(has a wheelchair upstairs and one downstairs, sliding board) Additional Comments: Pt is getting ready to renovate his bathroom.  Looking for contractors.  Also wants to know if there is an amputee support group in the area.     Prior Function Level of Independence: Independent with assistive device(s)         Comments: LLE prosthesis, dialysis 3x per week,Modif I with wheelchair, wife sponge bathes pt but he dresses himself. He can transfer himself but he  states he calls his wife as well to stand by.     Hand Dominance   Dominant Hand: Right    Extremity/Trunk Assessment   Upper Extremity Assessment Upper Extremity Assessment: Defer to OT evaluation    Lower Extremity Assessment Lower Extremity Assessment: RLE deficits/detail;LLE deficits/detail RLE Deficits / Details: BKA full ROM 4-/5 strength LLE Deficits / Details: End of residual limb with 2 areas of black eschar - pt states MD is aware.  He has a black sock over the limb.  Grossly 4-/5 strength and ROM WFL.    Cervical / Trunk Assessment Cervical / Trunk Assessment: Normal  Communication    Communication: No difficulties  Cognition Arousal/Alertness: Awake/alert Behavior During Therapy: WFL for tasks assessed/performed Overall Cognitive Status: Within Functional Limits for tasks assessed                                        General Comments General comments (skin integrity, edema, etc.): right residual limb with black eschar areas    Exercises Amputee Exercises Quad Sets: AROM;Both;5 reps;Supine Knee Flexion: Both;10 reps;AROM;Seated Knee Extension: AROM;Both;10 reps;Seated   Assessment/Plan    PT Assessment Patient needs continued PT services  PT Problem List Decreased activity tolerance;Decreased mobility;Decreased knowledge of use of DME;Decreased safety awareness       PT Treatment Interventions DME instruction;Functional mobility training;Therapeutic activities;Therapeutic exercise;Balance training;Patient/family education;Wheelchair mobility training    PT Goals (Current goals can be found in the Care Plan section)  Acute Rehab PT Goals Patient Stated Goal: to go home PT Goal Formulation: With patient Time For Goal Achievement: 01/29/18 Potential to Achieve Goals: Good    Frequency Min 3X/week   Barriers to discharge        Co-evaluation PT/OT/SLP Co-Evaluation/Treatment: Yes Reason for Co-Treatment: For patient/therapist safety PT goals addressed during session: Mobility/safety with mobility         AM-PAC PT "6 Clicks" Daily Activity  Outcome Measure Difficulty turning over in bed (including adjusting bedclothes, sheets and blankets)?: None Difficulty moving from lying on back to sitting on the side of the bed? : None Difficulty sitting down on and standing up from a chair with arms (e.g., wheelchair, bedside commode, etc,.)?: None Help needed moving to and from a bed to chair (including a wheelchair)?: None Help needed walking in hospital room?: Total Help needed climbing 3-5 steps with a railing? : Total 6 Click Score:  18    End of Session Equipment Utilized During Treatment: Gait belt Activity Tolerance: Patient tolerated treatment well Patient left: in chair;with call bell/phone within reach;with chair alarm set Nurse Communication: Mobility status PT Visit Diagnosis: Muscle weakness (generalized) (M62.81)    Time: 9381-8299 PT Time Calculation (min) (ACUTE ONLY): 24 min   Charges:   PT Evaluation $PT Eval Moderate Complexity: 1 Mod     PT G Codes:        Ola Raap,PT Acute Rehabilitation [phone] (pager)   Denice Paradise 01/15/2018, 12:03 PM

## 2018-01-15 NOTE — Progress Notes (Signed)
   Subjective: Patient had a fairly uneventful night, but was unable to sleep. States that the hydroxyzine did not help him get to sleep. His primary issue is getting to sleep, staying asleep. He is no longer having shortness of breath, chest pressure/chest pain, or dizziness. He is having some phantom limb pain this morning that he typically treats with Aleve. He does take gabapentin 100 mg with HD. His right facial twitching is no longer occurring. He states that he was told these were not seizures. He states that he is going for an EGD today. He is concerned with his hemoglobin levels as he received 4 units and it only increased 2 points. We discussed that the plan is to go for EGD today and pending those results we should have more information. He voices understanding. All questions and concerns addressed.  Objective: Vital signs in last 24 hours: Vitals:   01/14/18 1804 01/14/18 1900 01/14/18 2300 01/15/18 0426  BP:  130/83 (!) 121/52 127/83  Pulse: 78 (!) 121 (!) 118 (!) 121  Resp: 20 (!) 23 18 18   Temp: 98.2 F (36.8 C)  98.2 F (36.8 C) 98.6 F (37 C)  TempSrc: Oral  Oral Oral  SpO2: 98% 98% 100% 99%  Weight:      Height:       General: Obese male in no acute distress Pulm: Good air movement with no wheezing or crackles CV: Tachycardic, no murmurs or rubs Abdomen: Active bowel sounds, soft, nondistended, no tenderness to palpation  Assessment/Plan:  Thomas [name] is a 52 year old male with ESRD, hypertension, atrial fibrillation status post ablation currently on Coumadin, and history of Mallory-Weiss tear/ischemic colitis who presented to the ED with symptomatic anemia. He continues to be symptomatic this morning with fatigue and shortness of breath; however, it appears that the patient is no longer bleeding.  Symptomatic anemia secondary to possible upper GI bleed. - Patient presented with chest pain, shortness of breath, and dizziness.  Endorses a 2-day history of melena. - No  evidence of hemolysis at this point even though bilirubin elevated to 1.5 (LDH 142, haptoglobin 86). - Hemoglobin 6.7 this AM, Will give 1 unit pRBCs - INR 1.25  - Continue IV PPI 80 mg twice daily, will transition to p.o. starting tonight - Gastroenterology consulted. Appreciate recommendations. Plan for EGD today.  Right facial twitching. - Right facial twitching, sleep disturbance with visual and auditory hallucinations. - EEG is abnormal with diffuse background slowing, no epileptic discharge during these twitching episodes. - It is possible that the patient is still having focal seizures, will consult neurology today.   Phantom Limb Pain  - Can treat with Aleve PRN  Insomnia  -Avoid Ambien -Trial of hydroxyzine or trazodone if needed.   A. fib status post ablation - Tachycardic but appears to be in sinus rhythm. We will restart his home metoprolol pending EGD results. -Hold warfarin. Will resume pending EGD results  ESRD on hemodialysis - Went for HD yesterday  -Nephrology consulted  Hypertension -Home medications include amlodipine,lisinopril,and metoprolol. - Will restart pending EGD results  HIV -Continuefosamprenavir,raltegravir, andritonavir  Diabetes mellitus -Continue Lantus 20 units nightly+SSI  Troponinemia. Likely demand in the setting of Anemia and ESRD  Dispo: Anticipated discharge in approximately 1-2 day(s).   [name], MD 01/15/2018, 5:36 AM My Pager: [phone]

## 2018-01-15 NOTE — Transfer of Care (Signed)
Immediate Anesthesia Transfer of Care Note  Patient: Thomas [name]  Procedure(s) Performed: ESOPHAGOGASTRODUODENOSCOPY (EGD) WITH PROPOFOL (N/A )  Patient Location: Endoscopy Unit  Anesthesia Type:MAC  Level of Consciousness: awake, alert  and oriented  Airway & Oxygen Therapy: Patient Spontanous Breathing and Patient connected to nasal cannula oxygen  Post-op Assessment: Report given to RN, Post -op Vital signs reviewed and stable and Patient moving all extremities  Post vital signs: Reviewed and stable  Last Vitals:  Vitals:   01/15/18 1042 01/15/18 1254  BP: (!) 151/96 124/84  Pulse: (!) 114 (!) 116  Resp: 17 17  Temp: 36.9 C 36.5 C  SpO2: 96% 100%    Last Pain:  Vitals:   01/15/18 1254  TempSrc: Oral  PainSc:          Complications: No apparent anesthesia complications

## 2018-01-15 NOTE — Progress Notes (Signed)
Dialysis treatment terminated AMA per patient with 15 minutes remaining d/t unrelenting pain/discomfort.  AMA form signed and placed in patient chart.  3650 mL ultrafiltrated and net fluid removal 2650 mL.    Patient status unchanged. Lung sounds diminished to ausculation in all fields. Generalized edema. Cardiac: Afib, RVR.  Disconnected lines and removed needles.  Pressure held for 10 minutes and band aid/gauze dressing applied.  Report given to bedside RN, Urban Gibson.

## 2018-01-16 ENCOUNTER — Telehealth: Payer: Self-pay

## 2018-01-16 ENCOUNTER — Ambulatory Visit: Payer: Medicare Other | Admitting: Cardiology

## 2018-01-16 DIAGNOSIS — K226 Gastro-esophageal laceration-hemorrhage syndrome: Secondary | ICD-10-CM

## 2018-01-16 LAB — CBC
HEMATOCRIT: 23.8 % — AB (ref 39.0–52.0)
Hemoglobin: 7.9 g/dL — ABNORMAL LOW (ref 13.0–17.0)
MCH: 30.5 pg (ref 26.0–34.0)
MCHC: 33.2 g/dL (ref 30.0–36.0)
MCV: 91.9 fL (ref 78.0–100.0)
Platelets: 173 10*3/uL (ref 150–400)
RBC: 2.59 MIL/uL — ABNORMAL LOW (ref 4.22–5.81)
RDW: 21.5 % — ABNORMAL HIGH (ref 11.5–15.5)
WBC: 8.9 10*3/uL (ref 4.0–10.5)

## 2018-01-16 LAB — BPAM RBC
BLOOD PRODUCT EXPIRATION DATE: 201902012359
BLOOD PRODUCT EXPIRATION DATE: 201902022359
Blood Product Expiration Date: 201901182359
Blood Product Expiration Date: 201902012359
Blood Product Expiration Date: 201902022359
ISSUE DATE / TIME: 201901151453
ISSUE DATE / TIME: 201901160715
ISSUE DATE / TIME: 201901171513
ISSUE DATE / TIME: 201901151731
ISSUE DATE / TIME: 201901160715
Unit Type and Rh: 7300
Unit Type and Rh: 7300
Unit Type and Rh: 7300
Unit Type and Rh: 7300
Unit Type and Rh: 9500

## 2018-01-16 LAB — RENAL FUNCTION PANEL
Albumin: 3.2 g/dL — ABNORMAL LOW (ref 3.5–5.0)
Anion gap: 14 (ref 5–15)
BUN: 41 mg/dL — AB (ref 6–20)
CHLORIDE: 98 mmol/L — AB (ref 101–111)
CO2: 27 mmol/L (ref 22–32)
Calcium: 8.8 mg/dL — ABNORMAL LOW (ref 8.9–10.3)
Creatinine, Ser: 5.57 mg/dL — ABNORMAL HIGH (ref 0.61–1.24)
GFR calc Af Amer: 12 mL/min — ABNORMAL LOW (ref >60)
GFR calc non Af Amer: 11 mL/min — ABNORMAL LOW (ref >60)
GLUCOSE: 174 mg/dL — AB (ref 65–99)
PHOSPHORUS: 5.8 mg/dL — AB (ref 2.5–4.6)
POTASSIUM: 3.9 mmol/L (ref 3.5–5.1)
Sodium: 139 mmol/L (ref 135–145)

## 2018-01-16 LAB — TYPE AND SCREEN
ABO/RH(D): B POS
Antibody Screen: NEGATIVE
UNIT DIVISION: 0
UNIT DIVISION: 0
UNIT DIVISION: 0
Unit division: 0
Unit division: 0

## 2018-01-16 LAB — GLUCOSE, CAPILLARY
GLUCOSE-CAPILLARY: 215 mg/dL — AB (ref 65–99)
Glucose-Capillary: 149 mg/dL — ABNORMAL HIGH (ref 65–99)

## 2018-01-16 MED ORDER — LISINOPRIL 20 MG PO TABS: 40.0000 mg | ORAL_TABLET | Freq: Every day | ORAL | Status: DC

## 2018-01-16 MED ORDER — PANTOPRAZOLE SODIUM 40 MG PO TBEC
80.0000 mg | DELAYED_RELEASE_TABLET | Freq: Two times a day (BID) | ORAL | Status: DC
  Administered 2018-01-16: 80 mg via ORAL
  Filled 2018-01-16: qty 2

## 2018-01-16 MED ORDER — WARFARIN SODIUM 10 MG PO TABS: 10.0000 mg | ORAL_TABLET | ORAL | 0 refills | Status: DC

## 2018-01-16 MED ORDER — WARFARIN SODIUM 7.5 MG PO TABS: 7.5000 mg | ORAL_TABLET | ORAL | 0 refills | Status: DC

## 2018-01-16 MED ORDER — PANTOPRAZOLE SODIUM 40 MG PO TBEC: 80.0000 mg | DELAYED_RELEASE_TABLET | Freq: Two times a day (BID) | ORAL | 0 refills | Status: DC

## 2018-01-16 MED ORDER — LOPERAMIDE HCL 2 MG PO CAPS
2.0000 mg | ORAL_CAPSULE | Freq: Once | ORAL | Status: AC
Start: 2018-01-16 — End: 2018-01-16
  Administered 2018-01-16: 2 mg via ORAL
  Filled 2018-01-16: qty 1

## 2018-01-16 NOTE — Care Management Important Message (Signed)
Important Message  Patient Details  Name: Thomas [name] MRN: [id number] Date of Birth: [birth year]   Medicare Important Message Given:  Yes    Adyen Bifulco 01/16/2018, 1:51 PM

## 2018-01-16 NOTE — Progress Notes (Signed)
Subjective: Interval History: has no complaint of wants to go home, sleeping better.  Objective: Vital signs in last 24 hours: Temp:  [97.7 F (36.5 C)-98.9 F (37.2 C)] 98.7 F (37.1 C) (01/18 0749) Pulse Rate:  [100-140] 110 (01/18 0749) Resp:  [10-20] 13 (01/18 0749) BP: (92-151)/(44-96) 115/85 (01/18 0749) SpO2:  [96 %-100 %] 97 % (01/18 0749) Weight:  [115.8 kg (255 lb 4.7 oz)-118.4 kg (261 lb 0.4 oz)] 115.8 kg (255 lb 4.7 oz) (01/17 1850) Weight change: -0.325 kg (-11.5 oz)  Intake/Output from previous day: 01/17 0701 - 01/18 0700 In: 840 [P.O.:240; I.V.:400; IV Piggyback:200] Out: 2650  Intake/Output this shift: No intake/output data recorded.  General appearance: alert, cooperative, no distress and moderately obese Resp: clear to auscultation bilaterally Cardio: irregularly irregular rhythm and systolic murmur: holosystolic 2/6, blowing at apex GI: obese, nontender Extremities: bilat BKAs, avf RLA  Lab Results: Recent Labs    01/15/18 0729 01/15/18 2007 01/16/18 0459  WBC 8.7  --  8.9  HGB 6.7* 8.0* 7.9*  HCT 19.9* 23.7* 23.8*  PLT 170  --  173   BMET:  Recent Labs    01/15/18 0729 01/16/18 0459  NA 139 139  K 4.1 3.9  CL 100* 98*  CO2 25 27  GLUCOSE 162* 174*  BUN 102* 41*  CREATININE 7.88* 5.57*  CALCIUM 8.6* 8.8*   Recent Labs    01/14/18 1116  PTH 750*   Iron Studies:  Recent Labs    01/15/18 1508  IRON 47  TIBC 192*    Studies/Results: No results found.  I have reviewed the patient's current medications.  Assessment/Plan: 1 ESRD HD TTS  2 Anemia stable, 3 GIB MWT 4 DM controlled 5 HPTH vit D 6 Obesity 7 PVD 8 AFib 9 HIV 10 Sleep disorder P HD TTS, ? D/c follow Hb, give max esa/Fe. ccont PPI  LOS: 3 days   Jeneen Rinks Koron Godeaux 01/16/2018,9:17 AM

## 2018-01-16 NOTE — Telephone Encounter (Signed)
No answer

## 2018-01-16 NOTE — Progress Notes (Signed)
   Subjective: Patient is doing well today. States that he is hungry and would like to eat real food. He is happy to have an answer to why he was having dark stools and his hemoglobin dropped. Reflecting back, the patient does endorse some retching behaviors prior to the start of his melania. We discussed how to decrease the risk of future tears and the need for acid blocking medications. He voices understanding. He would like to go home today. We discussed holding his anticoagulation for approximately 1 week before restarting it. He agrees and understands the risk/benefits. All questions and concerns addressed.   Objective: Vital signs in last 24 hours: Vitals:   01/15/18 1850 01/15/18 1853 01/15/18 2117 01/16/18 0033  BP: (!) 103/51 (!) 117/59 107/89 124/72  Pulse: (!) 140 (!) 140 (!) 135 100  Resp: 14   20  Temp: 97.8 F (36.6 C)  98.9 F (37.2 C) 98.8 F (37.1 C)  TempSrc:   Oral Oral  SpO2:    96%  Weight: 255 lb 4.7 oz (115.8 kg)     Height:       General: Obese male in no acute distress Pulm: Good air movement with no wheezing or crackles  CV: Tachycardia, no murmurs, no rubs  Abdomen: Active bowel sounds, soft, non-distended, no tenderness to palpation   Assessment/Plan:  Thomas [name] is a 52 year old male with ESRD, hypertension, atrial fibrillation status post ablation currently on Coumadin, and history of Mallory-Weiss tear/ischemic colitis who presented to the ED with symptomatic anemia.He is now asymptomatic and feeling well.   Symptomatic anemia secondary to possible upper GI bleed. -Patient presented with chest pain, shortness of breath, and dizziness. Endorses a 2-day history of melena. -Hemoglobin 8.0 this AM, has received 5 units pRBCs total  -Transitioned to oral PPI BID - Will advance diet as tolerated - EGD preformed yesterday illustrating Mallory-Weiss tear with stigmata of recent bleeding. GI recommending against anticoagulation at this point.   Right  facial twitching. - Right facial twitching, sleep disturbance with visual and auditory hallucinations. - EEG is abnormal with diffuse background slowing, no epileptic discharge during these twitching episodes. - Spoke with neurology yesterday, feel it is unlikely to be focal aware seizures. Given that the patient is having auditory hallucinations they feel this is more psych in nature. Recommended avoid opiates and Ambien   Phantom Limb Pain  - Can treat with Aleve PRN  Insomnia  -Avoid Ambien  A. fib status post ablation - Tachycardic but appears to be in sinus rhythm.  - Restarted home metoprolol 25 mg BID - Currently holding warfarin due to recent Mallory-Weiss tear. Will resume in 7 days  ESRD on hemodialysis -Went for HD yesterday -Nephrology consulted  Hypertension -Home medications include amlodipine,lisinopril,and metoprolol. - Restart lisinopril and metoprolol today  - Restart amlodipine tomorrow   HIV -Continueraltegravir, andritonavir  Diabetes mellitus -Continue Lantus 20 units nightly+SSI  Troponinemia. Likely demand in the setting of Anemia and ESRD  Dispo: Anticipated discharge today if patient tolerates full diet.   Thomas Homes, MD 01/16/2018, 5:33 AM My Pager: [phone]

## 2018-01-16 NOTE — Telephone Encounter (Signed)
Hospital TOC per Dr Tarri Abernethy, discharge 01/16/2018, appt 01/23/2018.

## 2018-01-16 NOTE — Progress Notes (Addendum)
Discharged patient home with wife without any complications. Patient vertebralis understanding to pick up medications at the pharmacy and reviewed the new doses.Patient given asth home med Lexiva upon discharge.

## 2018-01-16 NOTE — Discharge Summary (Signed)
Name: Thomas [name] MRN: [id number] DOB: [birth year] 52 y.o. PCP: Patient, No Pcp Per  Date of Admission: 01/13/2018 10:10 AM Date of Discharge:  Attending Physician: Axel Filler, *  Discharge Diagnosis: 1. Mallory-Weiss Tear  2. Atrial Fibrillation s/p ablation   Principal Problem:   Mallory-Weiss tear Active Problems:   ESRD on dialysis (HCC)   PAF (paroxysmal atrial fibrillation) (HCC)   Symptomatic anemia   Acute blood loss anemia  Discharge Medications: Allergies as of 01/16/2018      Reactions   Oxycodone-acetaminophen    Hallucinations/delirum      Medication List    TAKE these medications   amLODipine 5 MG tablet Commonly known as:  NORVASC Take 1 tablet (5 mg total) by mouth daily.   camphor-menthol lotion Commonly known as:  SARNA Apply topically as needed for itching.   ferric gluconate 125 mg in sodium chloride 0.9 % 100 mL Inject 125 mg into the vein Every Tuesday,Thursday,and Saturday with dialysis.   fosamprenavir 700 MG tablet Commonly known as:  LEXIVA Take 700 mg by mouth 2 (two) times daily.   gabapentin 100 MG capsule Commonly known as:  NEURONTIN Take 1 capsule (100 mg total) by mouth every dialysis.   insulin glargine 100 unit/mL Sopn Commonly known as:  LANTUS Inject 0.3 mLs (30 Units total) into the skin at bedtime.   lipase/protease/amylase 12000 units Cpep capsule Commonly known as:  CREON Take 1 capsule (12,000 Units total) by mouth 3 (three) times daily before meals.   lisinopril 40 MG tablet Commonly known as:  PRINIVIL,ZESTRIL Take 1 tablet (40 mg total) by mouth daily.   metoprolol tartrate 25 MG tablet Commonly known as:  LOPRESSOR Take 1 tablet (25 mg total) by mouth 2 (two) times daily.   multivitamin Tabs tablet Take 1 tablet by mouth at bedtime.   naproxen sodium 220 MG tablet Commonly known as:  ALEVE Take 440 mg by mouth as needed (pain).   pantoprazole 40 MG tablet Commonly known as:   PROTONIX Take 2 tablets (80 mg total) by mouth 2 (two) times daily.   polycarbophil 625 MG tablet Commonly known as:  FIBERCON Take 1 tablet (625 mg total) by mouth daily.   raltegravir 400 MG tablet Commonly known as:  ISENTRESS Take 400 mg by mouth 2 (two) times daily.   ritonavir 100 MG capsule Commonly known as:  NORVIR Take 100 mg by mouth daily with breakfast.   sevelamer carbonate 800 MG tablet Commonly known as:  RENVELA Take 800 mg by mouth daily.   silver sulfADIAZINE 1 % cream Commonly known as:  SILVADENE Apply 1 application topically daily.   warfarin 10 MG tablet Commonly known as:  COUMADIN Take 1 tablet (10 mg total) by mouth See admin instructions. Take 10 mg by mouth daily Sunday, Monday, Wednesday and Friday Start taking on:  01/23/2018 What changed:  These instructions start on 01/23/2018. If you are unsure what to do until then, ask your doctor or other care provider.   warfarin 7.5 MG tablet Commonly known as:  COUMADIN Take 1 tablet (7.5 mg total) by mouth See admin instructions. Take 7.5 mg by mouth daily on Tuesday, Thursday ans Saturday Start taking on:  01/24/2018 What changed:  These instructions start on 01/24/2018. If you are unsure what to do until then, ask your doctor or other care provider.     Disposition and follow-up:   [name] was discharged from [facility] in Stable condition.  At  the hospital follow up visit please address:  1.  Mallory-Weiss tear. Please discuss with the patient his PPI therapy. We recommend a PPI twice daily for approximately 4 weeks, then transitioning to once daily PPI therapy. Atrial fibrillation. Please remind the patient to restart his warfarin therapy.  2.  Labs / imaging needed at time of follow-up: None  3.  Pending labs/ test needing follow-up: None  Follow-up Appointments: Follow-up [place] Follow up on 01/23/2018.   Why:  @ 10:15  AM Contact information: [address] [place] [facility] Course by problem list: Principal Problem:   Mallory-Weiss tear Active Problems:   ESRD on dialysis (HCC)   PAF (paroxysmal atrial fibrillation) (HCC)   Symptomatic anemia   Acute blood loss anemia   Mallory-Weiss Tear. Thomas [name] is a 52 year old male with a history of ESRD on HD, A. fib status post ablation on Coumadin, and previous hospitalization in August 2018 for Mallory-Weiss tear and ischemic colitis who presented to the ED with symptomatic anemia. Patient's hemoglobin found to be 5.1 down from a baseline of approximately 8. Over the course of the patient's hospitalization he received 5 units of packed red blood cells before his hemoglobin stabilized at approximately 8. EGD was performed that illustrated Mallory-Weiss tear with stigmata of recent bleed. The patient was treated medically with a PPI twice daily. His anticoagulation for his atrial fibrillation was held during this acute GI bleed. His diet was advanced and the patient was subsequently stable for discharge. He was given instructions to restart his warfarin in approximately 7 days, as outlined below. He will continue twice daily PPI therapy for approximately 1 month, then transition to PPI therapy once daily. He will follow-up with internal medicine residency clinic on 1/25 at 10:15 AM.  Atrial Fibrillation s/p ablation. Patient presented with a history of atrial fibrillation status post ablation on warfarin therapy. His warfarin was held due to acute GI bleed as discussed above. Patient was asked to hold his warfarin therapy for approximately 7 days after discharge at which point he was given instructions to restart anticoagulation. Patient expresses understanding of the risks and benefits. He will follow-up with internal medicine residency clinic on 1/25 at 10:15 AM.  Discharge Vitals:   BP 118/67 (BP Location: Left Arm)    Pulse 99   Temp 98.6 F (37 C) (Oral)   Resp 15   Ht 6\' 6"  (1.981 m)   Wt 255 lb 4.7 oz (115.8 kg)   SpO2 96%   BMI 29.50 kg/m   Pertinent Labs, Studies, and Procedures:   EGD - Mallory-Weiss tear. - No gross lesions in the stomach. - Normal examined duodenum. - No specimens collected.  Discharge Instructions: Discharge Instructions    Diet - low sodium heart healthy   Complete by:  As directed    Discharge instructions   Complete by:  As directed    Please return if you experience any of the following: - You have persistent dizziness, light-headedness, or fainting. - Your vomiting returns, or you have vomit that is bright red or looks like black coffee grounds. - You have bloody or black, tarry-looking stools. - You have chest pain. - You cannot eat or drink.   Increase activity slowly   Complete by:  As directed      Signed: [name], MD 01/16/2018, 12:39 PM   My Pager: [phone]

## 2018-01-16 NOTE — Discharge Instructions (Signed)
Thank you for allowing [place] to provide your care. We have made the following changes to your medicines.   - START taking Protonix 80 mg in the morning and in the evening (30 minutes prior to meals) for 4 weeks. After 4 weeks you will transition to Protonix 40 mg once daily.   - RESTART your Coumadin on 01/23/18  - Please avoid opiate medications (oxy, norco, ect.) and STOP taking the Ambien   - We have schedule a hospital follow-up with the Internal Medicine Residency Program (located in the basement of [facility]) on 01/23/18 at 10:15 AM.   - Continue all other medications as prescribed.  Mallory-Weiss Syndrome Mallory-Weiss syndrome refers to bleeding from tears in the lining of the tube that connects your throat to your stomach (esophagus). The tears occur at the entrance to your stomach. Usually the bleeding stops by itself after 24-48 hours. Surgery may be needed. This condition is not usually fatal. What are the causes? The tears that cause bleeding are often caused by severe or lasting vomiting or coughing. What increases the risk?  Abusing or drinking too much alcohol.  Having certain eating disorders, such as bulimia. What are the signs or symptoms?  Vomiting of bright red or black coffee-ground-like material.  Having black, tarry stools.  Fainting or experiencing loss of consciousness. How is this diagnosed? The procedure often used to diagnose Mallory-Weiss syndrome is an esophagogastroduodenoscopy (EGD). During an EGD procedure, a small, flexible, tube-like telescope (endoscope) is put into your mouth, passed through your esophagus into your stomach, and then into your small bowel. An EGD will show your health care provider where the tear is. How is this treated? It is necessary to stop the bleeding as soon as possible. The possible treatments to stop the bleeding include injecting medicine into bleeding areas to block (clot) the blood vessels. If bleeding is significant, it  may be necessary to replace the blood lost with a blood transfusion. Follow these instructions at home: Treat any conditions that may be causing the lasting or recurrent vomiting or coughing. This may include getting help for alcoholism or an eating disorder, if this applies. Contact a health care provider if: You have nausea or vomiting. Get help right away if:  You have persistent dizziness, light-headedness, or fainting.  Your vomiting returns, or you have vomit that is bright red or looks like black coffee grounds.  You have bloody or black, tarry-looking stools.  You have chest pain.  You cannot eat or drink. This information is not intended to replace advice given to you by your health care provider. Make sure you discuss any questions you have with your health care provider. Document Released: 05/05/2006 Document Revised: 05/23/2016 Document Reviewed: 02/09/2014 Elsevier Interactive Patient Education  [employer].

## 2018-01-18 NOTE — Progress Notes (Signed)
  Subjective:  Patient ID: Thomas [name], male    DOB: December 10, 1966,  MRN: [id number]  Chief Complaint  Patient presents with  . Wound Check    right heel and ankle   52 y.o. male returns for wound care.  Patient's wife has been continuing to perform dressing changes. Denies N/V/F/Ch.  Oral temp 99.0 today  Objective:   Vitals:   12/03/17 1519  Temp: 99 F (37.2 C)   General AA&O x3. Normal mood and affect.  Vascular Foot warm to touch.  Neurologic Sensation grossly diminished.  Dermatologic (Wound) Wound Location: R heel Wound Measurement: 5 cm x 8 cm no probe to bone but with deep tunneling laterally. Wound Base: Mixed fibrotic/necrotic Peri-wound: Normal Exudate: Scant/small amount Serosanguinous exudate.   No purulence no erythema no ascending cellulitis  Wound progress: Decreased since last check.  Orthopedic: No pain to palpation either foot.   Assessment & Plan:  Patient was evaluated and treated and all questions answered.  Ulcer right heel -Debridement as below.  -Dressed with betadine, DSD. -Radiographs taken no subcutaneous emphysema or acute changes.  -Discussed with patient about the recent worsening of the wound.  Discussed high likelihood of below-knee amputation given new changes to the wound with concern for osseous erosion of the calcaneus. Educated on signs of infection advised should he notice this to present to the ER for evaluation -Rx Clindamycin  Procedure: Selective Debridement of Wound Rationale: Removal of devitalized tissue from the wound to promote healing.  Pre-Debridement Wound Measurements: 5 cm x 8 cm x 0.3 cm  Post-Debridement Wound Measurements: same as pre-debridement. Type of Debridement: Selective Tissue Removed: Devitalized soft-tissue Instrumentation: Tissue nipper Dressing: Dry, sterile, compression dressing. Disposition: Patient tolerated procedure well. Patient to return in 1 week for follow-up.  No Follow-up on file.

## 2018-01-18 NOTE — Progress Notes (Signed)
  Subjective:  Patient ID: Thomas [name], male    DOB: October 13, 1966,  MRN: [id number]  Chief Complaint  Patient presents with  . Foot Problem    my right heel has been bleeding    52 y.o. male returns for wound care. Believes the wound to be about the same. States the heel has been bleeding. Denies N/V/F/Ch.  Objective:  There were no vitals filed for this visit. General AA&O x3. Normal mood and affect.  Vascular Foot warm to touch.  Neurologic Sensation grossly diminished.  Dermatologic (Wound) Wound Location: R heel Wound Measurement: 7x 5 x 0.1 Wound Base: Granular/Healthy Peri-wound: Normal Exudate: Scant/small amount Bloody exudate  Wound progress: No Change since last check.  Orthopedic: No pain to palpation either foot.   Assessment & Plan:  Patient was evaluated and treated and all questions answered.  Ulcer R Heel -No debridement -Dressed with betadine, DSD.  Return in about 1 week (around 11/12/2017) for Wound Care.

## 2018-01-18 NOTE — Progress Notes (Signed)
  Subjective:  Patient ID: Thomas [name], male    DOB: 1966/04/01,  MRN: [id number]  Chief Complaint  Patient presents with  . Routine Post Op    POV #1 - DOS 11-12-17  Wound debridement wtih irrigation and graft application right   "Doing alright"   52 y.o. male returns for wound care. Underwent debridement and VAC application [id number]. Denies N/V/F/Ch.  Objective:   Vitals:   11/14/17 1344  BP: (!) 109/52  Pulse: 86  Resp: 16  Temp: 98.5 F (36.9 C)   General AA&O x3. Normal mood and affect.  Vascular Foot warm to touch.  Neurologic Sensation grossly diminished.  Dermatologic (Wound) Wound Location: R heel Wound Measurement: 8 x 6 Wound Base: UTA due to graft. Peri-wound: Normal Exudate: Scant/small amount Serosanguinous exudate   Orthopedic: No pain to palpation either foot.   Assessment & Plan:  Patient was evaluated and treated and all questions answered.  Ulcer R heel -Graft left intact today. Staples not removed. -Dry dressing applied. -Will remove graft at next visit.  No Follow-up on file.

## 2018-01-19 ENCOUNTER — Encounter (HOSPITAL_COMMUNITY): Payer: Self-pay | Admitting: Gastroenterology

## 2018-01-19 ENCOUNTER — Ambulatory Visit (INDEPENDENT_AMBULATORY_CARE_PROVIDER_SITE_OTHER): Payer: Medicare Other | Admitting: Family

## 2018-01-19 DIAGNOSIS — IMO0002 Reserved for concepts with insufficient information to code with codable children: Secondary | ICD-10-CM

## 2018-01-19 DIAGNOSIS — Z89511 Acquired absence of right leg below knee: Secondary | ICD-10-CM

## 2018-01-19 NOTE — Progress Notes (Signed)
Post-Op Visit Note   Patient: Thomas [name]           Date of Birth: 18-Dec-1966           MRN: [id number] Visit Date: 01/19/2018 PCP: Patient, No Pcp Per  Chief Complaint:  Chief Complaint  Patient presents with  . Right Leg - Follow-up, Wound Check, Routine Post Op    HPI:  HPI The patient is a 52 year old gentleman seen status post right BKA on 12/06/17. Large ulcerative area to residual limb has been slow to heal. Currently doing silvadene dressing changes.  Is s/p L BKA 3 years prior.    Ortho Exam Incision is well healed. There is large ulcer covered with eschar, is 7 cm x 2 cm. No depth. No drainage. No erythema. No sign of infection.   Visit Diagnoses:  1. Below knee amputation status, right ([place])     Plan: Daily wound cleansing and silvadene dressing changes. Given iodosorb tube to apply. Chris accompanies visit. Continue wearing shrinker once obtained. Follow up in 2 weeks for repeat debridement.   Follow-Up Instructions: Return in about 2 weeks (around 02/02/2018).   Imaging: No results found.  Orders:  No orders of the defined types were placed in this encounter.  No orders of the defined types were placed in this encounter.    PMFS History: Patient Active Problem List   Diagnosis Date Noted  . Mallory-Weiss tear 01/16/2018  . Morbid obesity ([place])   . Fall   . Loose stools   . Benign essential HTN   . Subtherapeutic international normalized ratio (INR)   . History of left below knee amputation ([place]) 12/11/2017  . Hypoxia   . History of supraventricular tachycardia   . Chronic diastolic congestive heart failure ([place])   . S/P bilateral BKA (below knee amputation) ([place])   . Acute blood loss anemia   . Anemia of chronic disease   . Leukocytosis   . Post-operative pain   . Non-ischemic cardiomyopathy ([place])   . Hypokalemia 09/04/2017  . Essential hypertension 09/03/2017  . GERD (gastroesophageal reflux disease) 09/03/2017  . Diabetic foot ulcer ([place])  09/03/2017  . GIB (gastrointestinal bleeding) 07/26/2017  . Anemia due to end stage renal disease ([place]) 07/26/2017  . PAF (paroxysmal atrial fibrillation) ([place]) 07/26/2017  . Symptomatic anemia 07/26/2017  . Fluid overload 05/05/2017  . Elevated troponin 05/05/2017  . Pulmonary edema 05/05/2017  . ESRD (end stage renal disease) ([place]) 05/05/2017  . HIV disease ([place]) 03/25/2017  . Hypertensive heart disease with end stage renal disease on dialysis ([place]) 03/13/2017  . ESRD on dialysis ([place]) 03/13/2017  . Type 2 diabetes mellitus with complication ([place]) 71/05/2693  . SVT (supraventricular tachycardia) (HCC)    Past Medical History:  Diagnosis Date  . Anemia   . Chronic combined systolic and diastolic CHF (congestive heart failure) ([place])   . Diabetes ([place])   . 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 ESRD (end stage renal disease) [facility])    [facility] [address] T, Th, Sat  . HIV disease ([place])   . Hyperparathyroidism, secondary ([place])   . Hypertension   . Hypertensive heart disease with end stage renal disease on dialysis ([place]) 03/13/2017  . Subacute osteomyelitis, right ankle and foot ([place])   . SVT (supraventricular tachycardia) ([place])    ? afib or atrial flutter s/p TEE/DCCV with subsequent ablation due to reoccurrence in [place]  . Type 2 diabetes mellitus ([place])  Family History  Problem Relation Age of Onset  . Heart disease Mother   . Diabetes Mother   . Heart disease Father   . Cancer Neg Hx     Past Surgical History:  Procedure Laterality Date  . AMPUTATION Left    foot  . AMPUTATION Right 12/06/2017   Procedure: AMPUTATION BELOW KNEE;  Surgeon: [name], MD;  Location: [place];  Service: Orthopedics;  Laterality: Right;  . APPLICATION OF WOUND VAC Right 09/04/2017   Procedure: APPLICATION OF WOUND VAC;  Surgeon: [name], DPM;  Location: [place];  Service: Podiatry;  Laterality: Right;  . APPLICATION OF WOUND VAC  12/06/2017   Procedure: APPLICATION OF  WOUND VAC;  Surgeon: [name], MD;  Location: [place];  Service: Orthopedics;;  . COLONOSCOPY WITH PROPOFOL N/A 07/30/2017   Procedure: COLONOSCOPY WITH PROPOFOL;  Surgeon: [name], MD;  Location: [place];  Service: Endoscopy;  Laterality: N/A;  . ESOPHAGOGASTRODUODENOSCOPY (EGD) WITH PROPOFOL N/A 07/28/2017   Procedure: ESOPHAGOGASTRODUODENOSCOPY (EGD) WITH PROPOFOL;  Surgeon: [name], MD;  Location: [place];  Service: Endoscopy;  Laterality: N/A;  . ESOPHAGOGASTRODUODENOSCOPY (EGD) WITH PROPOFOL N/A 01/15/2018   Procedure: ESOPHAGOGASTRODUODENOSCOPY (EGD) WITH PROPOFOL;  Surgeon: [name], MD;  Location: [facility] ENDOSCOPY;  Service: Endoscopy;  Laterality: N/A;  . FLEXIBLE SIGMOIDOSCOPY N/A 07/27/2017   Procedure: FLEXIBLE SIGMOIDOSCOPY;  Surgeon: [name], MD;  Location: [facility] ENDOSCOPY;  Service: Endoscopy;  Laterality: N/A;  . GRAFT APPLICATION Right [id number]/41/2878   Procedure: SKIN GRAFT APPLICATION RIGHT FOOT;  Surgeon: [name], DPM;  Location: [place];  Service: Podiatry;  Laterality: Right;  . I&D EXTREMITY Right 09/04/2017   Procedure: IRRIGATION AND DEBRIDEMENT EXTREMITY;  Surgeon: [name], DPM;  Location: [place];  Service: Podiatry;  Laterality: Right;  . I&D EXTREMITY Right 11/12/2017   Procedure: IRRIGATION AND DEBRIDEMENT ULCER RIGHT FOOT;  Surgeon: [name], DPM;  Location: [place];  Service: Podiatry;  Laterality: Right;  . RIGHT AV FISTULA PLACEMENT  10/19/2012  . WOUND DEBRIDEMENT N/A 09/24/2017   Procedure: DEBRIDEMENT WOUND;  Surgeon: [name], DPM;  Location: [place];  Service: Podiatry;  Laterality: N/A;   Social History   Occupational History  . Occupation: disabled  Tobacco Use  . Smoking status: Never Smoker  . Smokeless tobacco: Never Used  Substance and Sexual Activity  . Alcohol use: No  . Drug use: No  . Sexual activity: Not on file

## 2018-01-20 NOTE — Telephone Encounter (Signed)
Transition Care Management Follow-up Telephone Call   Date discharged? 01/16/18   How have you been since you were released from the hospital? Spoke to patient's wife, patient is doing fine   Do you understand why you were in the hospital? yes   Do you understand the discharge instructions? yes   Where were you discharged to? home   Items Reviewed:  Medications reviewed: yes  Allergies reviewed: yes  Dietary changes reviewed: yes  Referrals reviewed: n/a   Functional Questionnaire:   Activities of Daily Living (ADLs):   He states they are independent in the following: adls States they require assistance with the following:    Any transportation issues/concerns?: no   Any patient concerns? Wife voiced no concerns   Confirmed importance and date/time of follow-up visits scheduled yes  Provider Appointment booked with [facility] 01/23/18 @ 10:15a  Confirmed with patient if condition begins to worsen call PCP or go to the ER.  Patient was given the office number and encouraged to call back with question or concerns.  : yes

## 2018-01-23 ENCOUNTER — Ambulatory Visit (INDEPENDENT_AMBULATORY_CARE_PROVIDER_SITE_OTHER): Payer: BLUE CROSS/BLUE SHIELD | Admitting: Internal Medicine

## 2018-01-23 ENCOUNTER — Encounter: Payer: Self-pay | Admitting: Internal Medicine

## 2018-01-23 VITALS — BP 126/76 | HR 75 | Temp 98.5°F | Ht 78.0 in

## 2018-01-23 DIAGNOSIS — D62 Acute posthemorrhagic anemia: Secondary | ICD-10-CM

## 2018-01-23 DIAGNOSIS — Z89512 Acquired absence of left leg below knee: Secondary | ICD-10-CM | POA: Diagnosis not present

## 2018-01-23 DIAGNOSIS — B20 Human immunodeficiency virus [HIV] disease: Secondary | ICD-10-CM | POA: Diagnosis not present

## 2018-01-23 DIAGNOSIS — K226 Gastro-esophageal laceration-hemorrhage syndrome: Secondary | ICD-10-CM | POA: Diagnosis not present

## 2018-01-23 DIAGNOSIS — I471 Supraventricular tachycardia: Secondary | ICD-10-CM

## 2018-01-23 DIAGNOSIS — I4891 Unspecified atrial fibrillation: Secondary | ICD-10-CM

## 2018-01-23 DIAGNOSIS — E785 Hyperlipidemia, unspecified: Secondary | ICD-10-CM | POA: Diagnosis not present

## 2018-01-23 DIAGNOSIS — N186 End stage renal disease: Secondary | ICD-10-CM

## 2018-01-23 DIAGNOSIS — Z7901 Long term (current) use of anticoagulants: Secondary | ICD-10-CM

## 2018-01-23 DIAGNOSIS — Z9889 Other specified postprocedural states: Secondary | ICD-10-CM | POA: Diagnosis not present

## 2018-01-23 DIAGNOSIS — Z992 Dependence on renal dialysis: Secondary | ICD-10-CM

## 2018-01-23 DIAGNOSIS — Z79899 Other long term (current) drug therapy: Secondary | ICD-10-CM

## 2018-01-23 DIAGNOSIS — Z89511 Acquired absence of right leg below knee: Secondary | ICD-10-CM | POA: Diagnosis not present

## 2018-01-23 DIAGNOSIS — I120 Hypertensive chronic kidney disease with stage 5 chronic kidney disease or end stage renal disease: Secondary | ICD-10-CM | POA: Diagnosis not present

## 2018-01-23 DIAGNOSIS — Z8719 Personal history of other diseases of the digestive system: Secondary | ICD-10-CM

## 2018-01-23 MED ORDER — PANTOPRAZOLE SODIUM 40 MG PO TBEC: 80.0000 mg | DELAYED_RELEASE_TABLET | Freq: Every day | ORAL | 1 refills | Status: AC

## 2018-01-23 MED ORDER — METOPROLOL TARTRATE 25 MG PO TABS: 25.0000 mg | ORAL_TABLET | Freq: Two times a day (BID) | ORAL | 1 refills | Status: DC

## 2018-01-23 NOTE — Progress Notes (Signed)
   CC: UGI bleed  HPI:  Thomas [name] is a 52 y.o. with a PMH of ESRD on HD, HIV, HTN, HLD, bil BKA, a fib s/p ablation on chronic anticoagulation and UGI 2/2 mallory weiss tear presenting to clinic for hospital follow up for UGI bleed.  Patient admitted recently for symptomatic anemia with Hgb to 5.1 from baseline of 8 and two days of loose dark stools (3-4 per day). Over course of hospitalization he received 5 units of pRBCs with stabilization of his Hgb at 8 by discharge. He has a h/o Mallory Weiss tear and ischemic colitis with admission in Aug 2018. So during this admission, EGD was performed which demonstrated mallory weiss tear with stigmata of recent bleeding; he reported no recent vomiting but states he has a chronic nonproductive cough that at times will cause him to dry heave after a prolonged episode. During admission, his warfarin was held and he was treated with IV PPIs. On discharge he was instructed to hold warfarin until 1/25 and to continue protonix 80mg  BID x33mo then switch to protonix 80mg  daily afterwards.  Today patient states he feels well; he reports melena with ~2 formed stools per day, no BRBPR. He denies chest pain, shortness of breath, headaches, dizziness, abd pain, nausea, vomiting, appetite change. He states that he had his blood work drawn at dialysis yesterday.   Patient states he ran out of metoprolol - took last dose last night.   He reports that he has a PCP, Dr. [name] that's on [facility] and does not wish to establish care here.  Please see problem based Assessment and Plan for status of patients chronic conditions.  Past Medical History:  Diagnosis Date  . Anemia   . Chronic combined systolic and diastolic CHF (congestive heart failure) ([place])   . Diabetes ([place])   . Diabetic wet gangrene of the foot ([place]) 09/04/2017  . ESRD (end stage renal disease) [facility])    [place] [id number] T, Th, Sat  . HIV disease ([place])   . Hyperparathyroidism, secondary  ([place])   . Hypertension   . Hypertensive heart disease with end stage renal disease on dialysis ([place]) 03/13/2017  . Subacute osteomyelitis, right ankle and foot ([place])   . SVT (supraventricular tachycardia) ([place])    ? afib or atrial flutter s/p TEE/DCCV with subsequent ablation due to reoccurrence in [place]  . Type 2 diabetes mellitus (HCC)     Review of Systems:   ROS Per HPI  Physical Exam:  Vitals:   01/23/18 1031  BP: 126/76  Pulse: 75  Temp: 98.5 F (36.9 C)  TempSrc: Oral  SpO2: 97%  Height: 6\' 6"  (1.981 m)   GENERAL- alert, co-operative, appears as stated age, not in any distress. HEENT- oral mucosa appears moist CARDIAC- RRR, no murmurs, rubs or gallops. RESP- Moving equal volumes of air, and clear to auscultation bilaterally, no wheezes or crackles. ABDOMEN- Soft, nontender, bowel sounds present. NEURO- No obvious Cr N abnormality. EXTREMITIES- pulse 2+, symmetric, no pedal edema. Bil BKA SKIN- Warm, dry.Marland Kitchen PSYCH- Normal mood and affect, appropriate thought content and speech.  Assessment & Plan:   See Encounters Tab for problem based charting.   Patient discussed with Dr. Carmel Sacramento, MD Internal Medicine PGY2

## 2018-01-23 NOTE — Patient Instructions (Signed)
Follow up with your primary care provider next month.  I refilled the metoprolol.  I refilled protonix; after 1 month of twice a day dosing, switch to once a day.  If you continue having black stools I would recommend seeing a stomach doctor (GI doctor).   If you have bloody stools, dizziness, weakness, please go to the emergency room.

## 2018-01-24 ENCOUNTER — Encounter: Payer: Self-pay | Admitting: Internal Medicine

## 2018-01-24 NOTE — Assessment & Plan Note (Signed)
RRR today on exam.   Plan: --refilled metoprolol

## 2018-01-24 NOTE — Assessment & Plan Note (Signed)
Patient with admission for symptomatic anemia 2/2 UGI blood loss from [employer] tear that was possibly caused by violent coughing.  Today he reports continued melena, however with 2 well formed stools per day. He declines FOBT testing today. It is difficult to say if the [employer] tear might still be oozing, and he also had evidence of ischemic colitis on a recent colonoscopy which could be causing occult bleeding. Today he is HD stable and denying symptoms of anemia.  He had labs drawn in dialysis yesterday.  Plan: --advised patient that if melena continues he should be evaluated by GI --advised if symptoms of anemia or worsening GI bleeding occur to present to ED --he will restart his coumadin tonight --refill provided for protonix 80mg  daily that he will start following completion of 1 month of BID dosing --he will f/u with pcp in 1 month per his report.

## 2018-01-27 NOTE — Progress Notes (Signed)
Internal Medicine Clinic Attending  Case discussed with Dr. Svalina  at the time of the visit.  We reviewed the resident's history and exam and pertinent patient test results.  I agree with the assessment, diagnosis, and plan of care documented in the resident's note.  

## 2018-02-02 ENCOUNTER — Ambulatory Visit (INDEPENDENT_AMBULATORY_CARE_PROVIDER_SITE_OTHER): Payer: Medicare Other | Admitting: Family

## 2018-02-02 ENCOUNTER — Encounter (INDEPENDENT_AMBULATORY_CARE_PROVIDER_SITE_OTHER): Payer: Self-pay | Admitting: Family

## 2018-02-02 DIAGNOSIS — E118 Type 2 diabetes mellitus with unspecified complications: Secondary | ICD-10-CM

## 2018-02-02 DIAGNOSIS — Z89512 Acquired absence of left leg below knee: Secondary | ICD-10-CM

## 2018-02-02 DIAGNOSIS — Z89511 Acquired absence of right leg below knee: Secondary | ICD-10-CM

## 2018-02-02 DIAGNOSIS — Z794 Long term (current) use of insulin: Secondary | ICD-10-CM

## 2018-02-02 NOTE — Progress Notes (Signed)
Post-Op Visit Note   Patient: Thomas [name]           Date of Birth: [birth year]           MRN: [id number] Visit Date: 02/02/2018 PCP: [name], MD  Chief Complaint:  Chief Complaint  Patient presents with  . Right Leg - Follow-up, Routine Post Op, Wound Check    HPI:  Wound Check    The patient is a 52 year old gentleman seen status post right BKA on 12/06/17. Large ulcerative area to residual limb has been slow to heal. Currently doing silvadene dressing changes. Did do some iodorsorb dressings following last appointment.  Is s/p L BKA 3 years prior.    Ortho Exam There is large eschar covered ulcerations x 2 along incision. No gaping or drainage. Some tenderness. No erythema. No sign of infection. Debrided with a 10 blade knife back to bleeding tissue. iodorsorb dressing applied.   Visit Diagnoses:  No diagnosis found.  Plan: Daily wound cleansing and silvadene dressing changes. Given iodosorb tube to apply. Chris accompanies visit. Continue wearing shrinker once obtained. Follow up in 2 weeks for repeat debridement.   Follow-Up Instructions: No Follow-up on file.   Imaging: No results found.  Orders:  No orders of the defined types were placed in this encounter.  No orders of the defined types were placed in this encounter.    PMFS History: Patient Active Problem List   Diagnosis Date Noted  . Mallory-Weiss tear 01/16/2018  . Morbid obesity ([place])   . Fall   . Subtherapeutic international normalized ratio (INR)   . History of left below knee amputation ([place]) 12/11/2017  . History of supraventricular tachycardia   . Chronic diastolic congestive heart failure ([place])   . S/P bilateral BKA (below knee amputation) ([place])   . Anemia of chronic disease   . Leukocytosis   . Non-ischemic cardiomyopathy ([place])   . Hypokalemia 09/04/2017  . Essential hypertension 09/03/2017  . GERD (gastroesophageal reflux disease) 09/03/2017  . Diabetic foot ulcer ([place])  09/03/2017  . GIB (gastrointestinal bleeding) 07/26/2017  . Anemia due to end stage renal disease ([place]) 07/26/2017  . PAF (paroxysmal atrial fibrillation) ([place]) 07/26/2017  . Symptomatic anemia 07/26/2017  . ESRD (end stage renal disease) ([place]) 05/05/2017  . HIV disease ([place]) 03/25/2017  . Hypertensive heart disease with end stage renal disease on dialysis ([place]) 03/13/2017  . ESRD on dialysis ([place]) 03/13/2017  . Type 2 diabetes mellitus with complication ([place]) 81/15/7262  . SVT (supraventricular tachycardia) (HCC)    Past Medical History:  Diagnosis Date  . Anemia   . Chronic combined systolic and diastolic CHF (congestive heart failure) ([place])   . Diabetes ([place])   . Diabetic wet gangrene of the foot ([place]) 09/04/2017  . ESRD (end stage renal disease) [facility])    [place] [address] T, Th, Sat  . HIV disease ([place])   . Hyperparathyroidism, secondary ([place])   . Hypertension   . Hypertensive heart disease with end stage renal disease on dialysis ([place]) 03/13/2017  . Subacute osteomyelitis, right ankle and foot ([place])   . SVT (supraventricular tachycardia) ([place])    ? afib or atrial flutter s/p TEE/DCCV with subsequent ablation due to reoccurrence in [place]  . Type 2 diabetes mellitus (HCC)     Family History  Problem Relation Age of Onset  . Heart disease Mother   . Diabetes Mother   . Heart disease Father   . Cancer Neg Hx  Past Surgical History:  Procedure Laterality Date  . AMPUTATION Left    foot  . AMPUTATION Right 12/06/2017   Procedure: AMPUTATION BELOW KNEE;  Surgeon: [name], MD;  Location: [facility];  Service: Orthopedics;  Laterality: Right;  . APPLICATION OF WOUND VAC Right 09/04/2017   Procedure: APPLICATION OF WOUND VAC;  Surgeon: [name], DPM;  Location: [place];  Service: Podiatry;  Laterality: Right;  . APPLICATION OF WOUND VAC  12/06/2017   Procedure: APPLICATION OF WOUND VAC;  Surgeon: [name], MD;  Location: [facility];  Service: Orthopedics;;  . COLONOSCOPY  WITH PROPOFOL N/A 07/30/2017   Procedure: COLONOSCOPY WITH PROPOFOL;  Surgeon: [name], MD;  Location: [place];  Service: Endoscopy;  Laterality: N/A;  . ESOPHAGOGASTRODUODENOSCOPY (EGD) WITH PROPOFOL N/A 07/28/2017   Procedure: ESOPHAGOGASTRODUODENOSCOPY (EGD) WITH PROPOFOL;  Surgeon: [name], MD;  Location: [facility];  Service: Endoscopy;  Laterality: N/A;  . ESOPHAGOGASTRODUODENOSCOPY (EGD) WITH PROPOFOL N/A 01/15/2018   Procedure: ESOPHAGOGASTRODUODENOSCOPY (EGD) WITH PROPOFOL;  Surgeon: [name], MD;  Location: [facility] END[facility]COPY;  Service: Endoscopy;  Laterality: N/A;  . FLEXIBLE SIGMOIDOSCOPY N/A 07/27/2017   Procedure: FLEXIBLE SIGMOIDOSCOPY;  Surgeon: [name], MD;  Location: [facility] ENDOSCOPY;  Service: Endoscopy;  Laterality: N/A;  . GRAFT APPLICATION Right 53/61/4431   Procedure: SKIN GRAFT APPLICATION RIGHT FOOT;  Surgeon: [name], DPM;  Location: [place];  Service: Podiatry;  Laterality: Right;  . I&D EXTREMITY Right 09/04/2017   Procedure: IRRIGATION AND DEBRIDEMENT EXTREMITY;  Surgeon: [name], DPM;  Location: [facility];  Service: Podiatry;  Laterality: Right;  . I&D EXTREMITY Right 11/12/2017   Procedure: IRRIGATION AND DEBRIDEMENT ULCER RIGHT FOOT;  Surgeon: [name], DPM;  Location: [place];  Service: Podiatry;  Laterality: Right;  . RIGHT AV FISTULA PLACEMENT  10/19/2012  . WOUND DEBRIDEMENT N/A 09/24/2017   Procedure: DEBRIDEMENT WOUND;  Surgeon: [name], DPM;  Location: [place];  Service: Podiatry;  Laterality: N/A;   Social History   Occupational History  . Occupation: disabled  Tobacco Use  . Smoking status: Never Smoker  . Smokeless tobacco: Never Used  Substance and Sexual Activity  . Alcohol use: No  . Drug use: No  . Sexual activity: Not on file

## 2018-02-03 ENCOUNTER — Telehealth (INDEPENDENT_AMBULATORY_CARE_PROVIDER_SITE_OTHER): Payer: Self-pay | Admitting: Orthopedic Surgery

## 2018-02-03 NOTE — Telephone Encounter (Signed)
RECORDS 01/03/2018- PRESENT FAXED TO [place] [phone]

## 2018-02-10 ENCOUNTER — Telehealth: Payer: Self-pay

## 2018-02-10 NOTE — Telephone Encounter (Signed)
Called pt, he states he needs his medicines changed, mainly due to cost but at last appt was to f/u in 1 month and he states the doctor wanted to change meds then but he didn't want to now he does. ACC 2/15 at 1045

## 2018-02-10 NOTE — Telephone Encounter (Signed)
Needs to speak with a nurse about meds. Please call pt back.  

## 2018-02-11 NOTE — Telephone Encounter (Signed)
Simon Rhein,   Per the [facility] note he has a PCP and does not want to follow with [place]. He needs to call his PCP.   Thanks,  Larkin Ina

## 2018-02-13 ENCOUNTER — Telehealth: Payer: Self-pay | Admitting: *Deleted

## 2018-02-13 ENCOUNTER — Ambulatory Visit (INDEPENDENT_AMBULATORY_CARE_PROVIDER_SITE_OTHER): Payer: Medicare Other | Admitting: Internal Medicine

## 2018-02-13 VITALS — BP 135/79 | HR 95

## 2018-02-13 DIAGNOSIS — Z89511 Acquired absence of right leg below knee: Secondary | ICD-10-CM | POA: Diagnosis not present

## 2018-02-13 DIAGNOSIS — B20 Human immunodeficiency virus [HIV] disease: Secondary | ICD-10-CM | POA: Diagnosis present

## 2018-02-13 DIAGNOSIS — R011 Cardiac murmur, unspecified: Secondary | ICD-10-CM | POA: Insufficient documentation

## 2018-02-13 DIAGNOSIS — Z79899 Other long term (current) drug therapy: Secondary | ICD-10-CM | POA: Diagnosis not present

## 2018-02-13 DIAGNOSIS — Z598 Other problems related to housing and economic circumstances: Secondary | ICD-10-CM

## 2018-02-13 DIAGNOSIS — Z89512 Acquired absence of left leg below knee: Secondary | ICD-10-CM

## 2018-02-13 DIAGNOSIS — I080 Rheumatic disorders of both mitral and aortic valves: Secondary | ICD-10-CM

## 2018-02-13 NOTE — Assessment & Plan Note (Addendum)
Murmur auscultated at the right upper sternal border on exam.  Patient is currently asymptomatic.  He denies having any chest pain, palpitations, shortness of breath, dizziness, or episodes of syncope.  Echo done in May 2018 showing mild aortic regurgitation and mild mitral regurgitation.  Left ventricular ejection fraction 35-40%.  Plan -No acute intervention needed at this time as patient is asymptomatic

## 2018-02-13 NOTE — Telephone Encounter (Signed)
Patient was seen today at Internal Med clinic and wants to get refills on medicaiton. Doctor there advised to get back in care here. Patient is having large copays and can not afford the medications. Advised to come Monday to see pharmacy and Juliann Pulse for SPAP to help with copays. CMA is not familiar with some of the medications the patient states he is taking. Patient scheduled to see pharmacy Monday 02/16/18 at 130, see Juliann Pulse and have labs.

## 2018-02-13 NOTE — Patient Instructions (Addendum)
Thomas [name] it was nice meeting you today.  -You have been scheduled for an appointment with infectious disease specialists on Monday, February 16, 2018 at 1:30 PM.  I would like to encourage you to go to that appointment.  FOLLOW-UP INSTRUCTIONS When: 1-2 months For: Regular checkup with your primary care doctor What to bring: Medications

## 2018-02-13 NOTE — Assessment & Plan Note (Addendum)
Patient is presenting to the clinic requesting his med HIV medications to be changed as he is not able to afford his current medications.  States he moved from [place] to [place] a year ago.  In [place], he was receiving his medications for free through a special program.  He is no longer able to qualify for that program since he is not a resident of [place] anymore.  States he will run out of his medication in 2 days and needs for his regimen to be changed.  He is currently taking raltegravir, ritonavir, and fosamprenavir.  Per chart review, patient was last seen by Dr. Baxter Flattery in March 2018.  Her plan was to change his HIV medication regimen but it seems patient never followed up with their office.  I explained to him that he needs to go back to infectious disease for further management of his HIV.  Patient reports feeling well otherwise and has no other medical complaints.  Plan -Our clinic called the infectious disease office.  Patient now has an appointment with them on Monday, February 16, 2018 at 1:30 PM.

## 2018-02-13 NOTE — Progress Notes (Signed)
   CC: Difficulty affording HIV meds  HPI:  [name] is a 52 y.o. male with past medical history of conditions listed below presenting to the clinic requesting his HIV medications to be changed as he is not able to pay for his current medications. Please see problem based charting for the status of the patient's current and chronic medical conditions.   Past Medical History:  Diagnosis Date  . Anemia   . Chronic combined systolic and diastolic CHF (congestive heart failure) ([place])   . Diabetes ([place])   . Diabetic foot ulcer ([place]) 09/03/2017  . Diabetic wet gangrene of the foot ([place]) 09/04/2017  . ESRD (end stage renal disease) [facility])    [place] [address] T, Th, Sat  . HIV disease ([place])   . Hyperparathyroidism, secondary ([place])   . Hypertension   . Hypertensive heart disease with end stage renal disease on dialysis ([place]) 03/13/2017  . Subacute osteomyelitis, right ankle and foot ([place])   . SVT (supraventricular tachycardia) ([place])    ? afib or atrial flutter s/p TEE/DCCV with subsequent ablation due to reoccurrence in [place]  . Type 2 diabetes mellitus ([place])    Review of Systems: Pertinent positives mentioned in HPI. Remainder of all ROS negative.   Physical Exam:  Vitals:   02/13/18 1104  BP: 135/79  Pulse: 95   Physical Exam  Constitutional: He is oriented to person, place, and time. He appears well-developed and well-nourished. No distress.  HENT:  Head: Normocephalic and atraumatic.  Mouth/Throat: Oropharynx is clear and moist.  Eyes: Right eye exhibits no discharge. Left eye exhibits no discharge.  Cardiovascular: Normal rate, regular rhythm and intact distal pulses.  Murmur heard. Murmur best appreciated at the right upper sternal border.  Pulmonary/Chest: Effort normal and breath sounds normal. No respiratory distress. He has no wheezes. He has no rales.  Abdominal: Soft. Bowel sounds are normal. He exhibits no distension. There is no tenderness.  Musculoskeletal:    Bilateral BKA's  Neurological: He is alert and oriented to person, place, and time.  Skin: Skin is warm and dry.    Assessment & Plan:   See Encounters Tab for problem based charting.  Patient discussed with Dr. Dareen Piano

## 2018-02-13 NOTE — Progress Notes (Signed)
Internal Medicine Clinic Attending  Case discussed with Dr. Rathoreat the time of the visit. We reviewed the resident's history and exam and pertinent patient test results. I agree with the assessment, diagnosis, and plan of care documented in the resident's note.  

## 2018-02-16 ENCOUNTER — Encounter (INDEPENDENT_AMBULATORY_CARE_PROVIDER_SITE_OTHER): Payer: Self-pay | Admitting: Orthopedic Surgery

## 2018-02-16 ENCOUNTER — Ambulatory Visit (INDEPENDENT_AMBULATORY_CARE_PROVIDER_SITE_OTHER): Payer: Medicare Other | Admitting: Orthopedic Surgery

## 2018-02-16 ENCOUNTER — Ambulatory Visit: Payer: Medicare Other

## 2018-02-16 ENCOUNTER — Ambulatory Visit (INDEPENDENT_AMBULATORY_CARE_PROVIDER_SITE_OTHER): Payer: Medicare Other | Admitting: Pharmacist

## 2018-02-16 VITALS — Ht 78.0 in | Wt 255.0 lb

## 2018-02-16 DIAGNOSIS — Z89511 Acquired absence of right leg below knee: Secondary | ICD-10-CM

## 2018-02-16 DIAGNOSIS — B20 Human immunodeficiency virus [HIV] disease: Secondary | ICD-10-CM | POA: Diagnosis present

## 2018-02-16 DIAGNOSIS — IMO0002 Reserved for concepts with insufficient information to code with codable children: Secondary | ICD-10-CM

## 2018-02-16 MED ORDER — BICTEGRAVIR-EMTRICITAB-TENOFOV 50-200-25 MG PO TABS: 1.0000 | ORAL_TABLET | Freq: Every day | ORAL | 5 refills | Status: DC

## 2018-02-16 MED ORDER — DOXYCYCLINE HYCLATE 100 MG PO TABS: 100.0000 mg | ORAL_TABLET | Freq: Every day | ORAL | 0 refills | Status: DC

## 2018-02-16 NOTE — Progress Notes (Signed)
Office Visit Note   Patient: Thomas [name]           Date of Birth: 1966-06-20           MRN: [id number] Visit Date: 02/16/2018              Requested by: [name], MD [address] [place], [place] [address] PCP: [name], MD  Chief Complaint  Patient presents with  . Right Leg - Routine Post Op    12/06/17 right BKA      HPI: Patient presents follow-up status post right transtibial amputation he states that the wound has started to break down.  He is currently wearing a compression stocking Iodosorb dressing plus 4 x 4 gauze.  Assessment & Plan: Visit Diagnoses:  1. Below knee amputation status, right ([place])     Plan: We will have him start on doxycycline daily to be used after dialysis.  Continue with Dial soap cleansing continue Iodosorb gauze and the stump shrinker.  If anything changes before follow-up there will call and we will follow-up immediately.  Follow-Up Instructions: Return in about 2 weeks (around 03/02/2018).   Ortho Exam  Patient is alert, oriented, no adenopathy, well-dressed, normal affect, normal respiratory effort. Examination patient has some eschar which has broken down from the enzymatic debridement.  There is good granulation tissue around the edges there is some small areas of fibrinous exudative tissue.  There is no ascending cellulitis no odor.  Due to the wound breakdown we will go ahead and start him on some antibiotics.  Imaging: No results found. No images are attached to the encounter.  Labs: Lab Results  Component Value Date   HGBA1C 7.7 (H) 09/24/2017   HGBA1C 11.7 (H) 05/05/2017   ESRSEDRATE 81 (H) 09/03/2017   CRP 4.6 (H) 09/03/2017   REPTSTATUS 12/10/2017 FINAL 12/05/2017   CULT NO GROWTH 5 DAYS 12/05/2017    @LABSALLVALUES (HGBA1)@  Body mass index is 29.47 kg/m.  Orders:  No orders of the defined types were placed in this encounter.  Meds ordered this encounter  Medications  . doxycycline (VIBRA-TABS) 100 MG  tablet    Sig: Take 1 tablet (100 mg total) by mouth daily.    Dispense:  30 tablet    Refill:  0     Procedures: No procedures performed  Clinical Data: No additional findings.  ROS:  All other systems negative, except as noted in the HPI. Review of Systems  Objective: Vital Signs: Ht 6\' 6"  (1.981 m)   Wt 255 lb (115.7 kg)   BMI 29.47 kg/m   Specialty Comments:  No specialty comments available.  PMFS History: Patient Active Problem List   Diagnosis Date Noted  . Murmur, cardiac 02/13/2018  . Mallory-Weiss tear 01/16/2018  . Morbid obesity ([place])   . Fall   . Subtherapeutic international normalized ratio (INR)   . History of left below knee amputation ([place]) 12/11/2017  . History of supraventricular tachycardia   . Chronic diastolic congestive heart failure ([place])   . S/P bilateral BKA (below knee amputation) ([place])   . Anemia of chronic disease   . Leukocytosis   . Non-ischemic cardiomyopathy ([place])   . Hypokalemia 09/04/2017  . Essential hypertension 09/03/2017  . GERD (gastroesophageal reflux disease) 09/03/2017  . GIB (gastrointestinal bleeding) 07/26/2017  . Anemia due to end stage renal disease ([place]) 07/26/2017  . PAF (paroxysmal atrial fibrillation) ([place]) 07/26/2017  . Symptomatic anemia 07/26/2017  . ESRD (end stage renal disease) ([place]) 05/05/2017  .  HIV disease ([place]) 03/25/2017  . Hypertensive heart disease with end stage renal disease on dialysis ([place]) 03/13/2017  . ESRD on dialysis ([place]) 03/13/2017  . Type 2 diabetes mellitus with complication ([place]) 34/19/3790  . SVT (supraventricular tachycardia) (HCC)    Past Medical History:  Diagnosis Date  . Anemia   . Chronic combined systolic and diastolic CHF (congestive heart failure) ([place])   . Diabetes ([place])   . Diabetic foot ulcer ([place]) 09/03/2017  . Diabetic wet gangrene of the foot ([place]) 09/04/2017  . ESRD (end stage renal disease) [facility])    H[facility] [address] T, Th, Sat  . HIV disease ([place])   .  Hyperparathyroidism, secondary ([place])   . Hypertension   . Hypertensive heart disease with end stage renal disease on dialysis ([place]) 03/13/2017  . Subacute osteomyelitis, right ankle and foot ([place])   . SVT (supraventricular tachycardia) ([place])    ? afib or atrial flutter s/p TEE/DCCV with subsequent ablation due to reoccurrence in [place]  . Type 2 diabetes mellitus (HCC)     Family History  Problem Relation Age of Onset  . Heart disease Mother   . Diabetes Mother   . Heart disease Father   . Cancer Neg Hx     Past Surgical History:  Procedure Laterality Date  . AMPUTATION Left    foot  . AMPUTATION Right 12/06/2017   Procedure: AMPUTATION BELOW KNEE;  Surgeon: [name], MD;  Location: [place];  Service: Orthopedics;  Laterality: Right;  . APPLICATION OF WOUND VAC Right 09/04/2017   Procedure: APPLICATION OF WOUND VAC;  Surgeon: [name], DPM;  Location: [facility];  Service: Podiatry;  Laterality: Right;  . APPLICATION OF WOUND VAC  12/06/2017   Procedure: APPLICATION OF WOUND VAC;  Surgeon: [name], MD;  Location: [place];  Service: Orthopedics;;  . COLONOSCOPY WITH PROPOFOL N/A 07/30/2017   Procedure: COLONOSCOPY WITH PROPOFOL;  Surgeon: [name], MD;  Location: [place];  Service: Endoscopy;  Laterality: N/A;  . ESOPHAGOGASTRODUODENOSCOPY (EGD) WITH PROPOFOL N/A 07/28/2017   Procedure: ESOPHAGOGASTRODUODENOSCOPY (EGD) WITH PROPOFOL;  Surgeon: [name], MD;  Location: [place];  Service: Endoscopy;  Laterality: N/A;  . ESOPHAGOGASTRODUODENOSCOPY (EGD) WITH PROPOFOL N/A 01/15/2018   Procedure: ESOPHAGOGASTRODUODENOSCOPY (EGD) WITH PROPOFOL;  Surgeon: [name], MD;  Location: [facility] ENDOSCOPY;  Service: Endoscopy;  Laterality: N/A;  . FLEXIBLE SIGMOIDOSCOPY N/A 07/27/2017   Procedure: FLEXIBLE SIGMOIDOSCOPY;  Surgeon: [name], MD;  Location: [facility] ENDOSCOPY;  Service: Endoscopy;  Laterality: N/A;  . GRAFT APPLICATION Right 24/08/7352   Procedure: SKIN GRAFT  APPLICATION RIGHT FOOT;  Surgeon: [name], DPM;  Location: [place];  Service: Podiatry;  Laterality: Right;  . I&D EXTREMITY Right 09/04/2017   Procedure: IRRIGATION AND DEBRIDEMENT EXTREMITY;  Surgeon: [name], DPM;  Location: [place];  Service: Podiatry;  Laterality: Right;  . I&D EXTREMITY Right 11/12/2017   Procedure: IRRIGATION AND DEBRIDEMENT ULCER RIGHT FOOT;  Surgeon: [name], DPM;  Location: [place];  Service: Podiatry;  Laterality: Right;  . RIGHT AV FISTULA PLACEMENT  10/19/2012  . WOUND DEBRIDEMENT N/A 09/24/2017   Procedure: DEBRIDEMENT WOUND;  Surgeon: [name], DPM;  Location: [place];  Service: Podiatry;  Laterality: N/A;   Social History   Occupational History  . Occupation: disabled  Tobacco Use  . Smoking status: Never Smoker  . Smokeless tobacco: Never Used  Substance and Sexual Activity  . Alcohol use: No  . Drug use:  No  . Sexual activity: Not on file

## 2018-02-16 NOTE — Progress Notes (Signed)
[facility] Pharmacy Visit  HPI: Thomas [name] is a 52 y.o. male who presents to the [facility] clinic for HIV follow-up.  Patient Active Problem List   Diagnosis Date Noted  . Murmur, cardiac 02/13/2018  . Mallory-Weiss tear 01/16/2018  . Morbid obesity ([place])   . Fall   . Subtherapeutic international normalized ratio (INR)   . History of left below knee amputation ([place]) 12/11/2017  . History of supraventricular tachycardia   . Chronic diastolic congestive heart failure ([place])   . S/P bilateral BKA (below knee amputation) ([place])   . Anemia of chronic disease   . Leukocytosis   . Non-ischemic cardiomyopathy ([place])   . Hypokalemia 09/04/2017  . Essential hypertension 09/03/2017  . GERD (gastroesophageal reflux disease) 09/03/2017  . GIB (gastrointestinal bleeding) 07/26/2017  . Anemia due to end stage renal disease ([place]) 07/26/2017  . PAF (paroxysmal atrial fibrillation) ([place]) 07/26/2017  . Symptomatic anemia 07/26/2017  . ESRD (end stage renal disease) ([place]) 05/05/2017  . HIV disease ([place]) 03/25/2017  . Hypertensive heart disease with end stage renal disease on dialysis ([place]) 03/13/2017  . ESRD on dialysis ([place]) 03/13/2017  . Type 2 diabetes mellitus with complication ([place]) 38/46/6599  . SVT (supraventricular tachycardia) (HCC)     Patient's Medications  New Prescriptions   BICTEGRAVIR-EMTRICITABINE-TENOFOVIR AF (BIKTARVY) 50-200-25 MG TABS TABLET    Take 1 tablet by mouth daily.  Previous Medications   AMLODIPINE (NORVASC) 5 MG TABLET    Take 1 tablet (5 mg total) by mouth daily.   CAMPHOR-MENTHOL (SARNA) LOTION    Apply topically as needed for itching.   FERRIC GLUCONATE 125 MG IN SODIUM CHLORIDE 0.9 % 100 ML    Inject 125 mg into the vein Every Tuesday,Thursday,and Saturday with dialysis.   GABAPENTIN (NEURONTIN) 100 MG CAPSULE    Take 1 capsule (100 mg total) by mouth every dialysis.   INSULIN GLARGINE (LANTUS) 100 UNIT/ML SOPN    Inject 0.3 mLs  (30 Units total) into the skin at bedtime.   LIPASE/PROTEASE/AMYLASE (CREON) 12000 UNITS CPEP CAPSULE    Take 1 capsule (12,000 Units total) by mouth 3 (three) times daily before meals.   LISINOPRIL (PRINIVIL,ZESTRIL) 40 MG TABLET    Take 1 tablet (40 mg total) by mouth daily.   METOPROLOL TARTRATE (LOPRESSOR) 25 MG TABLET    Take 1 tablet (25 mg total) by mouth 2 (two) times daily.   MULTIVITAMIN (RENA-VIT) TABS TABLET    Take 1 tablet by mouth at bedtime.   NAPROXEN SODIUM (ALEVE) 220 MG TABLET    Take 440 mg by mouth as needed (pain).   PANTOPRAZOLE (PROTONIX) 40 MG TABLET    Take 2 tablets (80 mg total) by mouth daily.   POLYCARBOPHIL (FIBERCON) 625 MG TABLET    Take 1 tablet (625 mg total) by mouth daily.   SEVELAMER CARBONATE (RENVELA) 800 MG TABLET    Take 800 mg by mouth daily.   SILVER SULFADIAZINE (SILVADENE) 1 % CREAM    Apply 1 application topically daily.   WARFARIN (COUMADIN) 10 MG TABLET    Take 1 tablet (10 mg total) by mouth See admin instructions. Take 10 mg by mouth daily Sunday, Monday, Wednesday and Friday   WARFARIN (COUMADIN) 7.5 MG TABLET    Take 1 tablet (7.5 mg total) by mouth See admin instructions. Take 7.5 mg by mouth daily on Tuesday, Thursday ans Saturday  Modified Medications   No medications on file  Discontinued Medications   FOSAMPRENAVIR (  LEXIVA) 700 MG TABLET    Take 700 mg by mouth 2 (two) times daily.    RALTEGRAVIR (ISENTRESS) 400 MG TABLET    Take 400 mg by mouth 2 (two) times daily.   RITONAVIR (NORVIR) 100 MG CAPSULE    Take 100 mg by mouth daily with breakfast.    Allergies: Allergies  Allergen Reactions  . Oxycodone-Acetaminophen     Hallucinations/delirum    Past Medical History: Past Medical History:  Diagnosis Date  . Anemia   . Chronic combined systolic and diastolic CHF (congestive heart failure) ([place])   . Diabetes ([place])   . Diabetic foot ulcer ([place]) 09/03/2017  . Diabetic wet gangrene of the foot ([place]) 09/04/2017  . ESRD (end stage  renal disease) [facility])    [facility] [address] T, Th, Sat  . HIV disease ([place])   . Hyperparathyroidism, secondary ([place])   . Hypertension   . Hypertensive heart disease with end stage renal disease on dialysis ([place]) 03/13/2017  . Subacute osteomyelitis, right ankle and foot ([place])   . SVT (supraventricular tachycardia) ([place])    ? afib or atrial flutter s/p TEE/DCCV with subsequent ablation due to reoccurrence in [place]  . Type 2 diabetes mellitus ([place])     Social History: Social History   Socioeconomic History  . Marital status: Married    Spouse name: Thomas [name]  . Number of children: 3  . Years of education: college  . Highest education level: Not on file  Social Needs  . Financial resource strain: Not on file  . Food insecurity - worry: Not on file  . Food insecurity - inability: Not on file  . Transportation needs - medical: Not on file  . Transportation needs - non-medical: Not on file  Occupational History  . Occupation: disabled  Tobacco Use  . Smoking status: Never Smoker  . Smokeless tobacco: Never Used  Substance and Sexual Activity  . Alcohol use: No  . Drug use: No  . Sexual activity: Not on file  Other Topics Concern  . Not on file  Social History Narrative  . Not on file    Labs: HIV 1 RNA Quant (copies/mL)  Date Value  03/10/2017 30 (H)   CD4 T Cell Abs (/uL)  Date Value  12/09/2017 490  05/05/2017 310 (L)  03/10/2017 420   Hep B S Ab (no units)  Date Value  03/10/2017 POS (A)   Hepatitis B Surface Ag (no units)  Date Value  03/10/2017 NEGATIVE   HCV Ab (no units)  Date Value  03/10/2017 NEGATIVE    Lipids:    Component Value Date/Time   CHOL 141 03/10/2017 1217   TRIG 284 (H) 03/10/2017 1217   HDL 22 (L) 03/10/2017 1217   CHOLHDL 6.4 (H) 03/10/2017 1217   VLDL 57 (H) 03/10/2017 1217   LDLCALC 62 03/10/2017 1217    Current HIV Regimen: Fosamprenavir + Norvir + Isentress  Assessment: Thomas [name] is here today to follow-up for his HIV infection.   I saw him last in May 2018 after he saw Dr. Baxter Flattery for the first time in March 2018. He is a transfer from [place]. He comes in today in a panic because he is running out of medication and needs help covering his co-pays. He has been getting his Lexiva and Isentress from [place] for the past year.  He tells me he had ADAP in [place] and they agreed to fill it for 1 year while he was getting on his feet down  here.  Not sure how he had ADAP in [place] as he also has Medicare.  When I saw him back in May, he met with myself and Juliann Pulse and was told to get a Part D program as there was nothing else we could do for him.  He comes in today saying he has [employer] as a Part D program.  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 Inez Catalina was able to find the [employer] but not the [employer].  He met with Juliann Pulse to get SPAP to cover co-pays and also met with Caryl Pina to get PAF assistance while his SPAP is processing.  He must bring in paperwork to both of them before he can get approved.  He was finally agreeable to change to something else for his HIV infection.  He tells me he has been on Lexiva and Isentress since he was diagnosed with HIV and has been on no other regimen.  He is agreeable to [place] today.  Because he has to fax his income documentation back to [place] and [place], I was able to give him 2 weeks of Biktarvy samples while things are getting figured out.  Juliann Pulse thinks he may be over income (he gets ~$3,000 per month for disability) for SPAP, but she said she will process it and see. I gave him my card and told him to call me if things aren't worked out in a week or so.  I made an appt with Dr. Baxter Flattery for end of March for him.  I will check a HIV viral load and CD4 today too since he hasn't had that checked since last May.  He has had a lot happen since we last saw him in May. He had a right BKA and is also on dialysis TThSat now.  Plan: - Stop Lexiva/Norvir and Isentress - Start Biktarvy PO once daily -  Send in income documentation for Juliann Pulse and Caryl Pina - HIV RNA and CD4 count - F/u with Dr. Baxter Flattery 3/20 at 230pm  Cassie L. Kuppelweiser, PharmD, [place], [place] [facility] 02/16/2018, 11:02 AM

## 2018-02-16 NOTE — Telephone Encounter (Signed)
Doris, pt stated to dr Jari Favre that he sees a dr turner on [address] and does not wish to see [facility] as a pcp, could we remove dr Tarri Abernethy from pcp thanks

## 2018-02-17 LAB — T-HELPER CELL (CD4) - (RCID CLINIC ONLY)
CD4 T CELL ABS: 610 /uL (ref 400–2700)
CD4 T CELL HELPER: 35 % (ref 33–55)

## 2018-02-18 ENCOUNTER — Encounter: Payer: Self-pay | Admitting: Internal Medicine

## 2018-02-18 IMAGING — DX DG CHEST 2V
2 series · 2 of 2 positions shown · non-contrast
Comparison: Chest x-ray 12/06/2017.

CLINICAL DATA: 51-year-old male complaining of upper mid chest pain
and dizziness for the past 3 days.

EXAM:
CHEST  2 VIEW

[w chest lat]
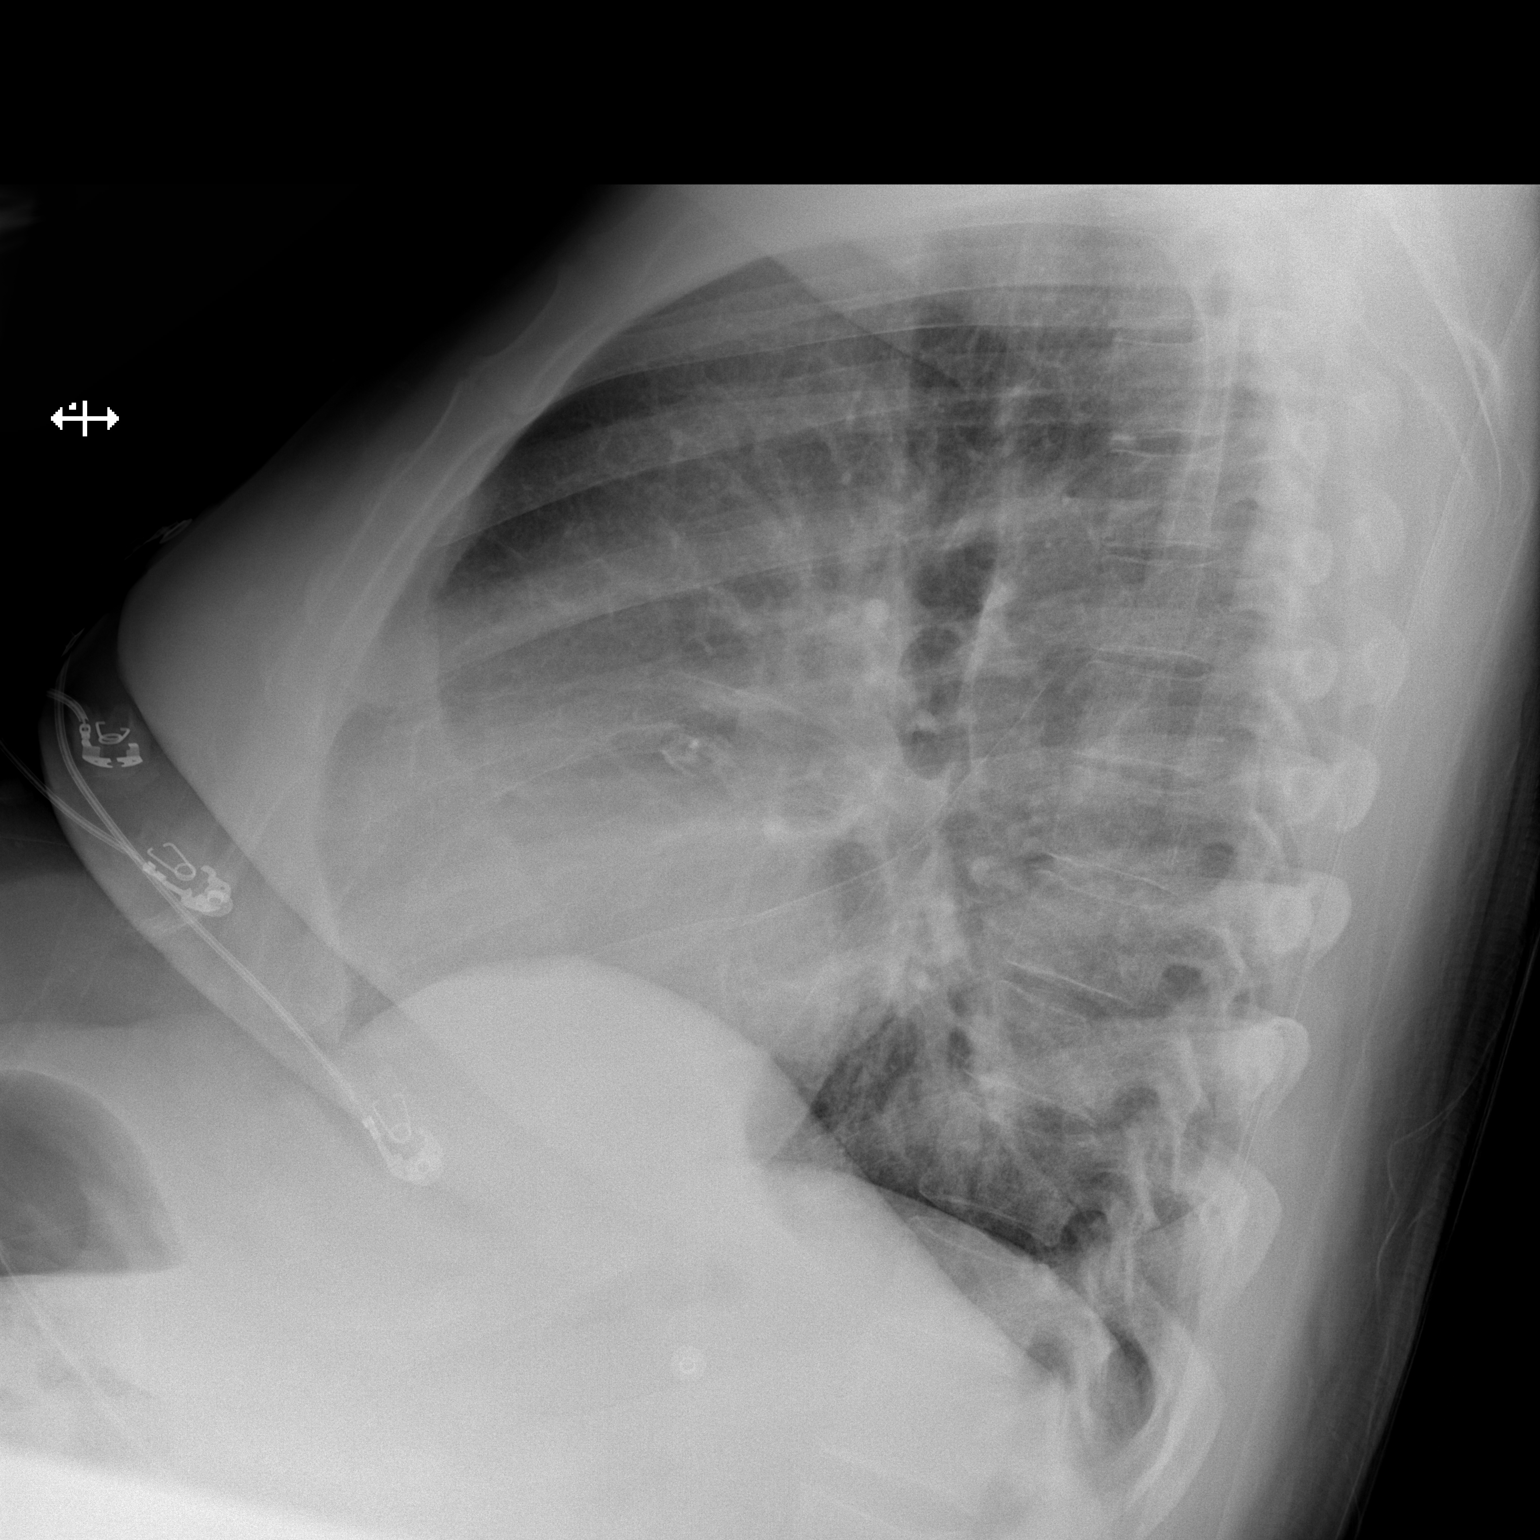

[x chest ap]
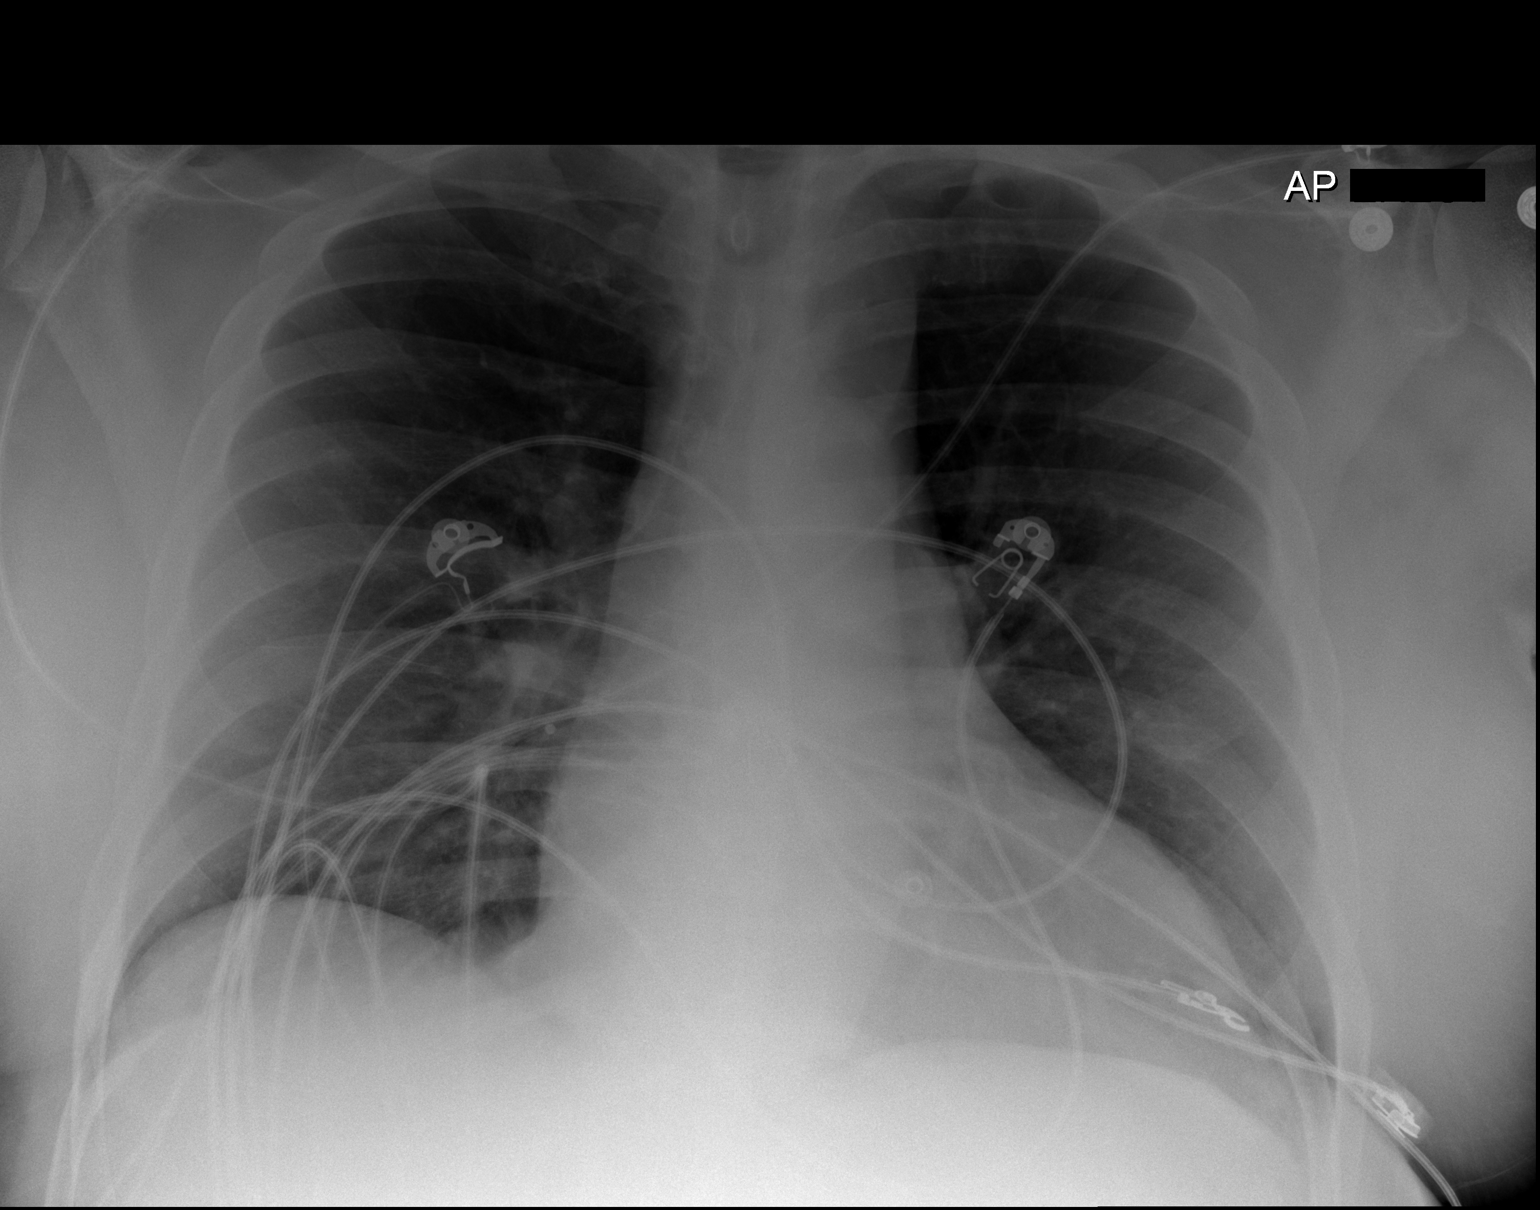

[2 of 2 positions shown; findings below may reference images not displayed]

FINDINGS: Lung volumes are normal. No consolidative airspace disease. No
pleural effusions. No pneumothorax. No pulmonary nodule or mass
noted. Pulmonary vasculature and the cardiomediastinal silhouette
are within normal limits.
IMPRESSION: No radiographic evidence of acute cardiopulmonary disease.

## 2018-02-19 LAB — HIV-1 RNA QUANT-NO REFLEX-BLD
HIV 1 RNA QUANT: NOT DETECTED {copies}/mL
HIV-1 RNA Quant, Log: <1.3 Log copies/mL

## 2018-02-25 ENCOUNTER — Ambulatory Visit: Payer: Medicare Other | Admitting: Physical Medicine & Rehabilitation

## 2018-03-02 ENCOUNTER — Other Ambulatory Visit: Payer: Self-pay | Admitting: Internal Medicine

## 2018-03-02 ENCOUNTER — Encounter (INDEPENDENT_AMBULATORY_CARE_PROVIDER_SITE_OTHER): Payer: Self-pay | Admitting: Orthopedic Surgery

## 2018-03-02 ENCOUNTER — Ambulatory Visit (INDEPENDENT_AMBULATORY_CARE_PROVIDER_SITE_OTHER): Payer: Medicare Other | Admitting: Orthopedic Surgery

## 2018-03-02 ENCOUNTER — Other Ambulatory Visit: Payer: Self-pay | Admitting: Pharmacist

## 2018-03-02 ENCOUNTER — Telehealth: Payer: Self-pay | Admitting: Podiatry

## 2018-03-02 VITALS — Ht 78.0 in | Wt 255.0 lb

## 2018-03-02 DIAGNOSIS — B20 Human immunodeficiency virus [HIV] disease: Secondary | ICD-10-CM

## 2018-03-02 DIAGNOSIS — Z89511 Acquired absence of right leg below knee: Secondary | ICD-10-CM

## 2018-03-02 DIAGNOSIS — IMO0002 Reserved for concepts with insufficient information to code with codable children: Secondary | ICD-10-CM

## 2018-03-02 MED ORDER — SILVER SULFADIAZINE 1 % EX CREA: 1.0000 "application " | TOPICAL_CREAM | Freq: Every day | CUTANEOUS | 3 refills | Status: DC

## 2018-03-02 MED ORDER — BICTEGRAVIR-EMTRICITAB-TENOFOV 50-200-25 MG PO TABS: 1.0000 | ORAL_TABLET | Freq: Every day | ORAL | 11 refills | Status: DC

## 2018-03-02 MED ORDER — CADEXOMER IODINE 0.9 % EX GEL: 1.0000 "application " | Freq: Every day | CUTANEOUS | 0 refills | Status: DC | PRN

## 2018-03-02 MED ORDER — PENTOXIFYLLINE ER 400 MG PO TBCR: 400.0000 mg | EXTENDED_RELEASE_TABLET | Freq: Every day | ORAL | 3 refills | Status: DC

## 2018-03-02 MED FILL — BIKTARVY 50-200-25 MG TABS: 50-200-25 | 30 days supply | Qty: 30 | Fill #0

## 2018-03-02 NOTE — Telephone Encounter (Signed)
Called and left a voicemail for pt to call me back in regards to questions I have about the Medical Records form that was filled out and signed on Friday 01 March. Asked pt to call me back directly at [phone] and to leave a message if I do not answer.

## 2018-03-02 NOTE — Progress Notes (Signed)
Office Visit Note   Patient: Thomas [name]           Date of Birth: October 21, 1966           MRN: [id number] Visit Date: 03/02/2018              Requested by: [name], MD [address] [place], [place] [address] PCP: Patient, No Pcp Per  Chief Complaint  Patient presents with  . Right Leg - Wound Check, Routine Post Op    12/06/17 Right BKA      HPI: Patient is a 52 year old gentleman who presents 3 months status post right transtibial amputation.  He is currently taking doxycycline once a day his samples of Iodosorb have run out he has been using Dial soap cleansing and has a double extra-large stump shrinker.  Patient is on Coumadin.  Assessment & Plan: Visit Diagnoses:  1. Below knee amputation status, right ([place])     Plan: We will have him take Trental once daily to help with his microcirculation he will either use Silvadene or Iodosorb depending on his insurance coverage he will continue the stump shrinker he will follow-up with [employer] for a new stump shrinker and prosthetic fitting for a K3 level ambulator.  Follow-Up Instructions: Return in about 2 weeks (around 03/16/2018).   Ortho Exam  Patient is alert, oriented, no adenopathy, well-dressed, normal affect, normal respiratory effort. On examination there is eschar over the wound.  After informed consent a 10 blade knife was debrided back the eschar there is fibrinous exudate at the base however around the wound edges there is good granulation tissue there is no exposed bone the wounds are approximately 2 cm in diameter x2 and approximately 3 mm deep.  Imaging: No results found. No images are attached to the encounter.  Labs: Lab Results  Component Value Date   HGBA1C 7.7 (H) 09/24/2017   HGBA1C 11.7 (H) 05/05/2017   ESRSEDRATE 81 (H) 09/03/2017   CRP 4.6 (H) 09/03/2017   REPTSTATUS 12/10/2017 FINAL 12/05/2017   CULT NO GROWTH 5 DAYS 12/05/2017    @LABSALLVALUES (HGBA1)@  Body mass index is 29.47  kg/m.  Orders:  No orders of the defined types were placed in this encounter.  Meds ordered this encounter  Medications  . pentoxifylline (TRENTAL) 400 MG CR tablet    Sig: Take 1 tablet (400 mg total) by mouth daily.    Dispense:  30 tablet    Refill:  3  . silver sulfADIAZINE (SILVADENE) 1 % cream    Sig: Apply 1 application topically daily. Apply to affected area daily plus dry dressing    Dispense:  400 g    Refill:  3     Procedures: No procedures performed  Clinical Data: No additional findings.  ROS:  All other systems negative, except as noted in the HPI. Review of Systems  Objective: Vital Signs: Ht 6\' 6"  (1.981 m)   Wt 255 lb (115.7 kg)   BMI 29.47 kg/m   Specialty Comments:  No specialty comments available.  PMFS History: Patient Active Problem List   Diagnosis Date Noted  . Murmur, cardiac 02/13/2018  . Mallory-Weiss tear 01/16/2018  . Morbid obesity ([place])   . Fall   . Subtherapeutic international normalized ratio (INR)   . History of left below knee amputation ([place]) 12/11/2017  . History of supraventricular tachycardia   . Chronic diastolic congestive heart failure ([place])   . S/P bilateral BKA (below knee amputation) ([place])   . Anemia  of chronic disease   . Leukocytosis   . Non-ischemic cardiomyopathy ([place])   . Hypokalemia 09/04/2017  . Essential hypertension 09/03/2017  . GERD (gastroesophageal reflux disease) 09/03/2017  . GIB (gastrointestinal bleeding) 07/26/2017  . Anemia due to end stage renal disease ([place]) 07/26/2017  . PAF (paroxysmal atrial fibrillation) ([place]) 07/26/2017  . Symptomatic anemia 07/26/2017  . ESRD (end stage renal disease) ([place]) 05/05/2017  . HIV disease ([place]) 03/25/2017  . Hypertensive heart disease with end stage renal disease on dialysis ([place]) 03/13/2017  . ESRD on dialysis ([place]) 03/13/2017  . Type 2 diabetes mellitus with complication ([place]) 25/36/6440  . SVT (supraventricular tachycardia) (HCC)    Past Medical  History:  Diagnosis Date  . Anemia   . Chronic combined systolic and diastolic CHF (congestive heart failure) ([place])   . Diabetes ([place])   . Diabetic foot ulcer ([place]) 09/03/2017  . Diabetic wet gangrene of the foot ([place]) 09/04/2017  . ESRD (end stage renal disease) [facility])    [facility] [address] T, Th, Sat  . HIV disease ([place])   . Hyperparathyroidism, secondary ([place])   . Hypertension   . Hypertensive heart disease with end stage renal disease on dialysis ([place]) 03/13/2017  . Subacute osteomyelitis, right ankle and foot ([place])   . SVT (supraventricular tachycardia) ([place])    ? afib or atrial flutter s/p TEE/DCCV with subsequent ablation due to reoccurrence in [place]  . Type 2 diabetes mellitus (HCC)     Family History  Problem Relation Age of Onset  . Heart disease Mother   . Diabetes Mother   . Heart disease Father   . Cancer Neg Hx     Past Surgical History:  Procedure Laterality Date  . AMPUTATION Left    foot  . AMPUTATION Right 12/06/2017   Procedure: AMPUTATION BELOW KNEE;  Surgeon: [name], MD;  Location: [place];  Service: Orthopedics;  Laterality: Right;  . APPLICATION OF WOUND VAC Right 09/04/2017   Procedure: APPLICATION OF WOUND VAC;  Surgeon: [name], DPM;  Location: [place];  Service: Podiatry;  Laterality: Right;  . APPLICATION OF WOUND VAC  12/06/2017   Procedure: APPLICATION OF WOUND VAC;  Surgeon: [name], MD;  Location: [place];  Service: Orthopedics;;  . COLONOSCOPY WITH PROPOFOL N/A 07/30/2017   Procedure: COLONOSCOPY WITH PROPOFOL;  Surgeon: [name], MD;  Location: [place];  Service: Endoscopy;  Laterality: N/A;  . ESOPHAGOGASTRODUODENOSCOPY (EGD) WITH PROPOFOL N/A 07/28/2017   Procedure: ESOPHAGOGASTRODUODENOSCOPY (EGD) WITH PROPOFOL;  Surgeon: [name], MD;  Location: [place];  Service: Endoscopy;  Laterality: N/A;  . ESOPHAGOGASTRODUODENOSCOPY (EGD) WITH PROPOFOL N/A 01/15/2018   Procedure: ESOPHAGOGASTRODUODENOSCOPY (EGD) WITH  PROPOFOL;  Surgeon: [name], MD;  Location: [facility] ENDOSCOPY;  Service: Endoscopy;  Laterality: N/A;  . FLEXIBLE SIGMOIDOSCOPY N/A 07/27/2017   Procedure: FLEXIBLE SIGMOIDOSCOPY;  Surgeon: [name], MD;  Location: [facility] ENDOSCOPY;  Service: Endoscopy;  Laterality: N/A;  . GRAFT APPLICATION Right 34/74/2595   Procedure: SKIN GRAFT APPLICATION RIGHT FOOT;  Surgeon: [name], DPM;  Location: [place];  Service: Podiatry;  Laterality: Right;  . I&D EXTREMITY Right 09/04/2017   Procedure: IRRIGATION AND DEBRIDEMENT EXTREMITY;  Surgeon: [name], DPM;  Location: [place];  Service: Podiatry;  Laterality: Right;  . I&D EXTREMITY Right 11/12/2017   Procedure: IRRIGATION AND DEBRIDEMENT ULCER RIGHT FOOT;  Surgeon: [name], DPM;  Location: [place];  Service: Podiatry;  Laterality: Right;  . RIGHT AV FISTULA  PLACEMENT  10/19/2012  . WOUND DEBRIDEMENT N/A 09/24/2017   Procedure: DEBRIDEMENT WOUND;  Surgeon: [name], DPM;  Location: [place];  Service: Podiatry;  Laterality: N/A;   Social History   Occupational History  . Occupation: disabled  Tobacco Use  . Smoking status: Never Smoker  . Smokeless tobacco: Never Used  Substance and Sexual Activity  . Alcohol use: No  . Drug use: No  . Sexual activity: Not on file

## 2018-03-04 ENCOUNTER — Telehealth: Payer: Self-pay | Admitting: Podiatry

## 2018-03-04 ENCOUNTER — Encounter: Payer: Medicare Other | Admitting: Physical Medicine & Rehabilitation

## 2018-03-04 NOTE — Telephone Encounter (Signed)
Hello Thomas [name], this is Thomas [name] calling you back about my husbands medical records. You can call me back at [phone]. Thank you, have a great day. Bye bye now.

## 2018-03-04 NOTE — Telephone Encounter (Signed)
Called and left a voicemail for Mrs. Tiedeman to call me back in regards to her husband's medical records request. I told her I had the signed form but the signature looks like K Wenrick which would be her name, but the pt has to be the one to sign the form for it to be legal. I explained if she did sign her name that her husband would have to sign a new form. Asked her to call me back directly at [phone] and if I do not answer to leave me a message.

## 2018-03-05 ENCOUNTER — Telehealth: Payer: Self-pay | Admitting: Podiatry

## 2018-03-05 NOTE — Telephone Encounter (Signed)
Called pt back to ask if it was him or his wife that signed his medical records release form. Pt stated it was his wife that signed the forms. I told him he has to be the one to sign since he is the pt and it's also from a legal matter. Asked pt what records he was wanting and pt stated he didn't know that he would have to ask his wife. I told him to have her call me and I will get that information from her.

## 2018-03-05 NOTE — Telephone Encounter (Signed)
Thomas [name], this is [name] calling you back. As far as the medical records, we need everything including the actual x-ray images. I told [name] there would be a $5.00 charge for that and she stated that was fine. I told her I would put everything up front including the new request for [name] to sign. Pt's wife stated they would be in Monday for her husband to sign the form and to pick up his records.

## 2018-03-05 NOTE — Telephone Encounter (Signed)
Called and left a voicemail for Mrs. Alphin letting her know that I spoke with her husband and told her since he is the pt, legally he has to be the one to sign the medical records release form. I told her we could fill the form out but we would need him to sign. I also asked her to call me back to let me know exactly what records she is requesting. I told her to call me back directly at [phone].

## 2018-03-06 ENCOUNTER — Encounter: Payer: Medicare Other | Attending: Physical Medicine & Rehabilitation | Admitting: Physical Medicine & Rehabilitation

## 2018-03-06 DIAGNOSIS — Z7901 Long term (current) use of anticoagulants: Secondary | ICD-10-CM | POA: Insufficient documentation

## 2018-03-06 DIAGNOSIS — E1142 Type 2 diabetes mellitus with diabetic polyneuropathy: Secondary | ICD-10-CM | POA: Insufficient documentation

## 2018-03-06 DIAGNOSIS — Z79899 Other long term (current) drug therapy: Secondary | ICD-10-CM | POA: Insufficient documentation

## 2018-03-06 DIAGNOSIS — N2581 Secondary hyperparathyroidism of renal origin: Secondary | ICD-10-CM | POA: Insufficient documentation

## 2018-03-06 DIAGNOSIS — R269 Unspecified abnormalities of gait and mobility: Secondary | ICD-10-CM | POA: Insufficient documentation

## 2018-03-06 DIAGNOSIS — Z833 Family history of diabetes mellitus: Secondary | ICD-10-CM | POA: Insufficient documentation

## 2018-03-06 DIAGNOSIS — Z21 Asymptomatic human immunodeficiency virus [HIV] infection status: Secondary | ICD-10-CM | POA: Insufficient documentation

## 2018-03-06 DIAGNOSIS — I471 Supraventricular tachycardia: Secondary | ICD-10-CM | POA: Insufficient documentation

## 2018-03-06 DIAGNOSIS — N186 End stage renal disease: Secondary | ICD-10-CM | POA: Insufficient documentation

## 2018-03-06 DIAGNOSIS — Z89511 Acquired absence of right leg below knee: Secondary | ICD-10-CM | POA: Insufficient documentation

## 2018-03-06 DIAGNOSIS — I5042 Chronic combined systolic (congestive) and diastolic (congestive) heart failure: Secondary | ICD-10-CM | POA: Insufficient documentation

## 2018-03-06 DIAGNOSIS — Z8249 Family history of ischemic heart disease and other diseases of the circulatory system: Secondary | ICD-10-CM | POA: Insufficient documentation

## 2018-03-06 DIAGNOSIS — I132 Hypertensive heart and chronic kidney disease with heart failure and with stage 5 chronic kidney disease, or end stage renal disease: Secondary | ICD-10-CM | POA: Insufficient documentation

## 2018-03-06 DIAGNOSIS — Z89512 Acquired absence of left leg below knee: Secondary | ICD-10-CM | POA: Insufficient documentation

## 2018-03-06 DIAGNOSIS — E1122 Type 2 diabetes mellitus with diabetic chronic kidney disease: Secondary | ICD-10-CM | POA: Insufficient documentation

## 2018-03-09 ENCOUNTER — Encounter: Payer: Self-pay | Admitting: Podiatry

## 2018-03-09 DIAGNOSIS — M79676 Pain in unspecified toe(s): Secondary | ICD-10-CM

## 2018-03-09 NOTE — Progress Notes (Signed)
Pt came into the office to sign medical records release form and picked up requested medical records including his CD of x-rays.

## 2018-03-16 ENCOUNTER — Encounter (INDEPENDENT_AMBULATORY_CARE_PROVIDER_SITE_OTHER): Payer: Self-pay | Admitting: Orthopedic Surgery

## 2018-03-16 ENCOUNTER — Ambulatory Visit (INDEPENDENT_AMBULATORY_CARE_PROVIDER_SITE_OTHER): Payer: Medicare Other | Admitting: Orthopedic Surgery

## 2018-03-16 VITALS — Ht 78.0 in | Wt 255.0 lb

## 2018-03-16 DIAGNOSIS — IMO0002 Reserved for concepts with insufficient information to code with codable children: Secondary | ICD-10-CM

## 2018-03-16 DIAGNOSIS — Z89511 Acquired absence of right leg below knee: Secondary | ICD-10-CM

## 2018-03-16 NOTE — Progress Notes (Signed)
Office Visit Note   Patient: Thomas [name]           Date of Birth: Nov 03, 1966           MRN: [id number] Visit Date: 03/16/2018              Requested by: No referring provider defined for this encounter. PCP: Patient, No Pcp Per  Chief Complaint  Patient presents with  . Right Leg - Follow-up    12/06/2017 right BKA      HPI: Patient is a 52 year old gentleman who is status post right transtibial amputation patient has used Silvadene dressing changes he is he is trying to tell and doxycycline he is currently on a 3 extra-large shrinker.  Assessment & Plan: Visit Diagnoses:  1. Below knee amputation status, right ([place])     Plan: Continue with the stump shrinker.  This seems to be healing well impression.  Anticipate plan for a prosthesis in about a month.  Follow-Up Instructions: Return in about 3 weeks (around 04/06/2018).   Ortho Exam  Patient is alert, oriented, no adenopathy, well-dressed, normal affect, normal respiratory effort. Patient is ambulating in a wheelchair.  Examination the wounds are healing nicely debridement of the medial wound shows good granulation tissue this was touched with silver nitrate.  Iodosorb and gauze was applied.  The shrinker was applied.  Patient will continue with the compression wound care and follow-up in 3 weeks.  There is no redness no cellulitis no drainage no signs of infection.  Imaging: No results found. No images are attached to the encounter.  Labs: Lab Results  Component Value Date   HGBA1C 7.7 (H) 09/24/2017   HGBA1C 11.7 (H) 05/05/2017   ESRSEDRATE 81 (H) 09/03/2017   CRP 4.6 (H) 09/03/2017   REPTSTATUS 12/10/2017 FINAL 12/05/2017   CULT NO GROWTH 5 DAYS 12/05/2017    @LABSALLVALUES (HGBA1)@  Body mass index is 29.47 kg/m.  Orders:  No orders of the defined types were placed in this encounter.  No orders of the defined types were placed in this encounter.    Procedures: No procedures performed  Clinical  Data: No additional findings.  ROS:  All other systems negative, except as noted in the HPI. Review of Systems  Objective: Vital Signs: Ht 6\' 6"  (1.981 m)   Wt 255 lb (115.7 kg)   BMI 29.47 kg/m   Specialty Comments:  No specialty comments available.  PMFS History: Patient Active Problem List   Diagnosis Date Noted  . Murmur, cardiac 02/13/2018  . Mallory-Weiss tear 01/16/2018  . Morbid obesity ([place])   . Fall   . Subtherapeutic international normalized ratio (INR)   . History of left below knee amputation ([place]) 12/11/2017  . History of supraventricular tachycardia   . Chronic diastolic congestive heart failure ([place])   . S/P bilateral BKA (below knee amputation) ([place])   . Anemia of chronic disease   . Leukocytosis   . Non-ischemic cardiomyopathy ([place])   . Hypokalemia 09/04/2017  . Essential hypertension 09/03/2017  . GERD (gastroesophageal reflux disease) 09/03/2017  . GIB (gastrointestinal bleeding) 07/26/2017  . Anemia due to end stage renal disease ([place]) 07/26/2017  . PAF (paroxysmal atrial fibrillation) ([place]) 07/26/2017  . Symptomatic anemia 07/26/2017  . ESRD (end stage renal disease) ([place]) 05/05/2017  . HIV disease ([place]) 03/25/2017  . Hypertensive heart disease with end stage renal disease on dialysis ([place]) 03/13/2017  . ESRD on dialysis ([place]) 03/13/2017  . Type 2 diabetes mellitus with complication ([place]) 74/07/1447  .  SVT (supraventricular tachycardia) (HCC)    Past Medical History:  Diagnosis Date  . Anemia   . Chronic combined systolic and diastolic CHF (congestive heart failure) ([place])   . Diabetes ([place])   . Diabetic foot ulcer ([place]) 09/03/2017  . Diabetic wet gangrene of the foot ([place]) 09/04/2017  . ESRD (end stage renal disease) [facility])    [facility] [address] T, Th, Sat  . HIV disease ([place])   . Hyperparathyroidism, secondary ([employer])   . Hypertension   . Hypertensive heart disease with end stage renal disease on dialysis ([place]) 03/13/2017  . Subacute  osteomyelitis, right ankle and foot ([place])   . SVT (supraventricular tachycardia) ([place])    ? afib or atrial flutter s/p TEE/DCCV with subsequent ablation due to reoccurrence in [place]  . Type 2 diabetes mellitus (HCC)     Family History  Problem Relation Age of Onset  . Heart disease Mother   . Diabetes Mother   . Heart disease Father   . Cancer Neg Hx     Past Surgical History:  Procedure Laterality Date  . AMPUTATION Left    foot  . AMPUTATION Right 12/06/2017   Procedure: AMPUTATION BELOW KNEE;  Surgeon: [name], MD;  Location: [place];  Service: Orthopedics;  Laterality: Right;  . APPLICATION OF WOUND VAC Right 09/04/2017   Procedure: APPLICATION OF WOUND VAC;  Surgeon: [name], DPM;  Location: [place];  Service: Podiatry;  Laterality: Right;  . APPLICATION OF WOUND VAC  12/06/2017   Procedure: APPLICATION OF WOUND VAC;  Surgeon: [name], MD;  Location: [place];  Service: Orthopedics;;  . COLONOSCOPY WITH PROPOFOL N/A 07/30/2017   Procedure: COLONOSCOPY WITH PROPOFOL;  Surgeon: [name], MD;  Location: [place];  Service: Endoscopy;  Laterality: N/A;  . ESOPHAGOGASTRODUODENOSCOPY (EGD) WITH PROPOFOL N/A 07/28/2017   Procedure: ESOPHAGOGASTRODUODENOSCOPY (EGD) WITH PROPOFOL;  Surgeon: [name], MD;  Location: [place];  Service: Endoscopy;  Laterality: N/A;  . ESOPHAGOGASTRODUODENOSCOPY (EGD) WITH PROPOFOL N/A 01/15/2018   Procedure: ESOPHAGOGASTRODUODENOSCOPY (EGD) WITH PROPOFOL;  Surgeon: [name], MD;  Location: [facility] ENDOSCOPY;  Service: Endoscopy;  Laterality: N/A;  . FLEXIBLE SIGMOIDOSCOPY N/A 07/27/2017   Procedure: FLEXIBLE SIGMOIDOSCOPY;  Surgeon: [name], MD;  Location: [facility] ENDOSCOPY;  Service: Endoscopy;  Laterality: N/A;  . GRAFT APPLICATION Right 94/85/4627   Procedure: SKIN GRAFT APPLICATION RIGHT FOOT;  Surgeon: [name], DPM;  Location: [place];  Service: Podiatry;  Laterality: Right;  . I&D EXTREMITY Right 09/04/2017    Procedure: IRRIGATION AND DEBRIDEMENT EXTREMITY;  Surgeon: [name], DPM;  Location: [place];  Service: Podiatry;  Laterality: Right;  . I&D EXTREMITY Right 11/12/2017   Procedure: IRRIGATION AND DEBRIDEMENT ULCER RIGHT FOOT;  Surgeon: [name], DPM;  Location: [place];  Service: Podiatry;  Laterality: Right;  . RIGHT AV FISTULA PLACEMENT  10/19/2012  . WOUND DEBRIDEMENT N/A 09/24/2017   Procedure: DEBRIDEMENT WOUND;  Surgeon: [name], DPM;  Location: [place];  Service: Podiatry;  Laterality: N/A;   Social History   Occupational History  . Occupation: disabled  Tobacco Use  . Smoking status: Never Smoker  . Smokeless tobacco: Never Used  Substance and Sexual Activity  . Alcohol use: No  . Drug use: No  . Sexual activity: Not on file

## 2018-03-18 ENCOUNTER — Ambulatory Visit: Payer: Medicare Other | Admitting: Internal Medicine

## 2018-03-25 MED FILL — BIKTARVY 50-200-25 MG TABS: 50-200-25 | 30 days supply | Qty: 30 | Fill #1

## 2018-03-31 ENCOUNTER — Other Ambulatory Visit: Payer: Self-pay | Admitting: *Deleted

## 2018-03-31 DIAGNOSIS — I471 Supraventricular tachycardia: Secondary | ICD-10-CM

## 2018-03-31 MED ORDER — METOPROLOL TARTRATE 25 MG PO TABS: 25.0000 mg | ORAL_TABLET | Freq: Two times a day (BID) | ORAL | 1 refills | Status: DC

## 2018-03-31 NOTE — Telephone Encounter (Signed)
pls assign PCP and sch CC appt next 45 ish days for chronic dz mgmt

## 2018-04-06 ENCOUNTER — Encounter (INDEPENDENT_AMBULATORY_CARE_PROVIDER_SITE_OTHER): Payer: Self-pay | Admitting: Orthopedic Surgery

## 2018-04-06 ENCOUNTER — Ambulatory Visit (INDEPENDENT_AMBULATORY_CARE_PROVIDER_SITE_OTHER): Payer: Medicare Other | Admitting: Orthopedic Surgery

## 2018-04-06 VITALS — Ht 78.0 in | Wt 255.0 lb

## 2018-04-06 DIAGNOSIS — IMO0002 Reserved for concepts with insufficient information to code with codable children: Secondary | ICD-10-CM

## 2018-04-06 DIAGNOSIS — Z89511 Acquired absence of right leg below knee: Secondary | ICD-10-CM

## 2018-04-06 NOTE — Progress Notes (Signed)
Office Visit Note   Patient: Thomas [name]           Date of Birth: 1966/11/15           MRN: [id number] Visit Date: 04/06/2018              Requested by: No referring provider defined for this encounter. PCP: System, Pcp Not In  Chief Complaint  Patient presents with  . Right Leg - Follow-up    S/p right BKA 12/06/17      HPI: Patient is a 52 year old gentleman who presents status post right transtibial amputation.  He is about 4 months out.  Assessment & Plan: Visit Diagnoses:  1. Below knee amputation status, right [facility])     Plan: Patient will follow-up with [facility] for prosthetic fitting this week.  He will continue with the stump shrinker.  Follow-Up Instructions: Return in about 1 month (around 05/04/2018).   Ortho Exam  Patient is alert, oriented, no adenopathy, well-dressed, normal affect, normal respiratory effort. Examination patient does have a few scabs over the 2 residual wounds.  The scabs were removed there is good healthy granulation tissue no signs of infection the wound areas are about 3 x 5 mm x2.  Iodosorb plus 4 x 4 plus the stump shrinker was applied.  Imaging: No results found. No images are attached to the encounter.  Labs: Lab Results  Component Value Date   HGBA1C 7.7 (H) 09/24/2017   HGBA1C 11.7 (H) 05/05/2017   ESRSEDRATE 81 (H) 09/03/2017   CRP 4.6 (H) 09/03/2017   REPTSTATUS 12/10/2017 FINAL 12/05/2017   CULT NO GROWTH 5 DAYS 12/05/2017    @LABSALLVALUES (HGBA1)@  Body mass index is 29.47 kg/m.  Orders:  No orders of the defined types were placed in this encounter.  No orders of the defined types were placed in this encounter.    Procedures: No procedures performed  Clinical Data: No additional findings.  ROS:  All other systems negative, except as noted in the HPI. Review of Systems  Objective: Vital Signs: Ht 6\' 6"  (1.981 m)   Wt 255 lb (115.7 kg)   BMI 29.47 kg/m   Specialty Comments:  No specialty comments  available.  PMFS History: Patient Active Problem List   Diagnosis Date Noted  . Murmur, cardiac 02/13/2018  . Mallory-Weiss tear 01/16/2018  . Morbid obesity ([place])   . Fall   . Subtherapeutic international normalized ratio (INR)   . History of left below knee amputation ([place]) 12/11/2017  . History of supraventricular tachycardia   . Chronic diastolic congestive heart failure ([place])   . S/P bilateral BKA (below knee amputation) ([place])   . Anemia of chronic disease   . Leukocytosis   . Non-ischemic cardiomyopathy ([place])   . Hypokalemia 09/04/2017  . Essential hypertension 09/03/2017  . GERD (gastroesophageal reflux disease) 09/03/2017  . GIB (gastrointestinal bleeding) 07/26/2017  . Anemia due to end stage renal disease ([place]) 07/26/2017  . PAF (paroxysmal atrial fibrillation) ([place]) 07/26/2017  . Symptomatic anemia 07/26/2017  . ESRD (end stage renal disease) ([place]) 05/05/2017  . HIV disease ([place]) 03/25/2017  . Hypertensive heart disease with end stage renal disease on dialysis ([place]) 03/13/2017  . ESRD on dialysis ([place]) 03/13/2017  . Type 2 diabetes mellitus with complication ([place]) 09/21/3006  . SVT (supraventricular tachycardia) (HCC)    Past Medical History:  Diagnosis Date  . Anemia   . Chronic combined systolic and diastolic CHF (congestive heart failure) ([place])   . Diabetes ([place])   .  Diabetic foot ulcer ([place]) 09/03/2017  . Diabetic wet gangrene of the foot ([place]) 09/04/2017  . ESRD (end stage renal disease) [facility])    [facility] [address] T, Th, Sat  . HIV disease ([place])   . Hyperparathyroidism, secondary ([place])   . Hypertension   . Hypertensive heart disease with end stage renal disease on dialysis ([place]) 03/13/2017  . Subacute osteomyelitis, right ankle and foot ([place])   . SVT (supraventricular tachycardia) ([place])    ? afib or atrial flutter s/p TEE/DCCV with subsequent ablation due to reoccurrence in [place]  . Type 2 diabetes mellitus (HCC)     Family History  Problem Relation Age of  Onset  . Heart disease Mother   . Diabetes Mother   . Heart disease Father   . Cancer Neg Hx     Past Surgical History:  Procedure Laterality Date  . AMPUTATION Left    foot  . AMPUTATION Right 12/06/2017   Procedure: AMPUTATION BELOW KNEE;  Surgeon: [name], MD;  Location: [place];  Service: Orthopedics;  Laterality: Right;  . APPLICATION OF WOUND VAC Right 09/04/2017   Procedure: APPLICATION OF WOUND VAC;  Surgeon: [name], DPM;  Location: [place];  Service: Podiatry;  Laterality: Right;  . APPLICATION OF WOUND VAC  12/06/2017   Procedure: APPLICATION OF WOUND VAC;  Surgeon: [name], MD;  Location: [place];  Service: Orthopedics;;  . COLONOSCOPY WITH PROPOFOL N/A 07/30/2017   Procedure: COLONOSCOPY WITH PROPOFOL;  Surgeon: [name], MD;  Location: [place];  Service: Endoscopy;  Laterality: N/A;  . ESOPHAGOGASTRODUODENOSCOPY (EGD) WITH PROPOFOL N/A 07/28/2017   Procedure: ESOPHAGOGASTRODUODENOSCOPY (EGD) WITH PROPOFOL;  Surgeon: [name], MD;  Location: [place];  Service: Endoscopy;  Laterality: N/A;  . ESOPHAGOGASTRODUODENOSCOPY (EGD) WITH PROPOFOL N/A 01/15/2018   Procedure: ESOPHAGOGASTRODUODENOSCOPY (EGD) WITH PROPOFOL;  Surgeon: [name], MD;  [facility];  Service: Endoscopy;  Laterality: N/A;  . FLEXIBLE SIGMOIDOSCOPY N/A 07/27/2017   Procedure: FLEXIBLE SIGMOIDOSCOPY;  Surgeon: [name], MD;  Location: [facility] ENDOSCOPY;  Service: Endoscopy;  Laterality: N/A;  . GRAFT APPLICATION Right 86/76/7209   Procedure: SKIN GRAFT APPLICATION RIGHT FOOT;  Surgeon: [name], DPM;  Location: [place];  Service: Podiatry;  Laterality: Right;  . I&D EXTREMITY Right 09/04/2017   Procedure: IRRIGATION AND DEBRIDEMENT EXTREMITY;  Surgeon: [name], DPM;  Location: [place];  Service: Podiatry;  Laterality: Right;  . I&D EXTREMITY Right 11/12/2017   Procedure: IRRIGATION AND DEBRIDEMENT ULCER RIGHT FOOT;  Surgeon: [name], DPM;   Location: [place];  Service: Podiatry;  Laterality: Right;  . RIGHT AV FISTULA PLACEMENT  10/19/2012  . WOUND DEBRIDEMENT N/A 09/24/2017   Procedure: DEBRIDEMENT WOUND;  Surgeon: [name], DPM;  Location: [place];  Service: Podiatry;  Laterality: N/A;   Social History   Occupational History  . Occupation: disabled  Tobacco Use  . Smoking status: Never Smoker  . Smokeless tobacco: Never Used  Substance and Sexual Activity  . Alcohol use: No  . Drug use: No  . Sexual activity: Not on file

## 2018-04-20 MED FILL — BIKTARVY 50-200-25 MG TABS: 50-200-25 | 30 days supply | Qty: 30 | Fill #2

## 2018-04-29 ENCOUNTER — Other Ambulatory Visit (INDEPENDENT_AMBULATORY_CARE_PROVIDER_SITE_OTHER): Payer: Self-pay

## 2018-04-29 ENCOUNTER — Telehealth (INDEPENDENT_AMBULATORY_CARE_PROVIDER_SITE_OTHER): Payer: Self-pay

## 2018-04-29 DIAGNOSIS — IMO0002 Reserved for concepts with insufficient information to code with codable children: Secondary | ICD-10-CM

## 2018-04-29 NOTE — Telephone Encounter (Signed)
Requesting referral for PT for right BKA gait training. Order in system for neuro rehab and order also faxed to [facility].

## 2018-04-29 NOTE — Progress Notes (Signed)
Physical therapy. 

## 2018-05-02 ENCOUNTER — Encounter (HOSPITAL_COMMUNITY): Payer: Self-pay | Admitting: Emergency Medicine

## 2018-05-02 ENCOUNTER — Other Ambulatory Visit: Payer: Self-pay

## 2018-05-02 ENCOUNTER — Inpatient Hospital Stay (HOSPITAL_COMMUNITY)
Admission: EM | Admit: 2018-05-02 | Discharge: 2018-05-03 | DRG: 308 | Disposition: A | Discharge status: Home / Self Care | Payer: Medicare Other | Attending: Internal Medicine | Admitting: Internal Medicine

## 2018-05-02 ENCOUNTER — Emergency Department (HOSPITAL_COMMUNITY): Payer: Medicare Other

## 2018-05-02 DIAGNOSIS — R000 Tachycardia, unspecified: Secondary | ICD-10-CM | POA: Diagnosis present

## 2018-05-02 DIAGNOSIS — I471 Supraventricular tachycardia: Secondary | ICD-10-CM | POA: Diagnosis not present

## 2018-05-02 DIAGNOSIS — B20 Human immunodeficiency virus [HIV] disease: Secondary | ICD-10-CM | POA: Diagnosis not present

## 2018-05-02 DIAGNOSIS — I248 Other forms of acute ischemic heart disease: Secondary | ICD-10-CM | POA: Diagnosis not present

## 2018-05-02 DIAGNOSIS — I1311 Hypertensive heart and chronic kidney disease without heart failure, with stage 5 chronic kidney disease, or end stage renal disease: Secondary | ICD-10-CM

## 2018-05-02 DIAGNOSIS — I480 Paroxysmal atrial fibrillation: Principal | ICD-10-CM | POA: Diagnosis present

## 2018-05-02 DIAGNOSIS — K219 Gastro-esophageal reflux disease without esophagitis: Secondary | ICD-10-CM | POA: Diagnosis present

## 2018-05-02 DIAGNOSIS — I428 Other cardiomyopathies: Secondary | ICD-10-CM

## 2018-05-02 DIAGNOSIS — E1122 Type 2 diabetes mellitus with diabetic chronic kidney disease: Secondary | ICD-10-CM | POA: Diagnosis present

## 2018-05-02 DIAGNOSIS — I440 Atrioventricular block, first degree: Secondary | ICD-10-CM | POA: Diagnosis not present

## 2018-05-02 DIAGNOSIS — I2489 Other forms of acute ischemic heart disease: Secondary | ICD-10-CM

## 2018-05-02 DIAGNOSIS — I132 Hypertensive heart and chronic kidney disease with heart failure and with stage 5 chronic kidney disease, or end stage renal disease: Secondary | ICD-10-CM | POA: Diagnosis present

## 2018-05-02 DIAGNOSIS — I4891 Unspecified atrial fibrillation: Secondary | ICD-10-CM

## 2018-05-02 DIAGNOSIS — Z89511 Acquired absence of right leg below knee: Secondary | ICD-10-CM

## 2018-05-02 DIAGNOSIS — R52 Pain, unspecified: Secondary | ICD-10-CM

## 2018-05-02 DIAGNOSIS — Z89512 Acquired absence of left leg below knee: Secondary | ICD-10-CM

## 2018-05-02 DIAGNOSIS — E118 Type 2 diabetes mellitus with unspecified complications: Secondary | ICD-10-CM | POA: Diagnosis present

## 2018-05-02 DIAGNOSIS — Z833 Family history of diabetes mellitus: Secondary | ICD-10-CM | POA: Diagnosis not present

## 2018-05-02 DIAGNOSIS — N186 End stage renal disease: Secondary | ICD-10-CM

## 2018-05-02 DIAGNOSIS — Z794 Long term (current) use of insulin: Secondary | ICD-10-CM

## 2018-05-02 DIAGNOSIS — E1152 Type 2 diabetes mellitus with diabetic peripheral angiopathy with gangrene: Secondary | ICD-10-CM | POA: Diagnosis not present

## 2018-05-02 DIAGNOSIS — Z992 Dependence on renal dialysis: Secondary | ICD-10-CM | POA: Diagnosis not present

## 2018-05-02 DIAGNOSIS — N2581 Secondary hyperparathyroidism of renal origin: Secondary | ICD-10-CM | POA: Diagnosis not present

## 2018-05-02 DIAGNOSIS — Z6831 Body mass index (BMI) 31.0-31.9, adult: Secondary | ICD-10-CM

## 2018-05-02 DIAGNOSIS — I5042 Chronic combined systolic (congestive) and diastolic (congestive) heart failure: Secondary | ICD-10-CM | POA: Diagnosis present

## 2018-05-02 DIAGNOSIS — D631 Anemia in chronic kidney disease: Secondary | ICD-10-CM | POA: Diagnosis present

## 2018-05-02 DIAGNOSIS — K5900 Constipation, unspecified: Secondary | ICD-10-CM | POA: Diagnosis present

## 2018-05-02 DIAGNOSIS — Z885 Allergy status to narcotic agent status: Secondary | ICD-10-CM | POA: Diagnosis not present

## 2018-05-02 DIAGNOSIS — Z8249 Family history of ischemic heart disease and other diseases of the circulatory system: Secondary | ICD-10-CM | POA: Diagnosis not present

## 2018-05-02 DIAGNOSIS — I4581 Long QT syndrome: Secondary | ICD-10-CM | POA: Diagnosis present

## 2018-05-02 DIAGNOSIS — Z79899 Other long term (current) drug therapy: Secondary | ICD-10-CM

## 2018-05-02 DIAGNOSIS — D638 Anemia in other chronic diseases classified elsewhere: Secondary | ICD-10-CM | POA: Diagnosis present

## 2018-05-02 DIAGNOSIS — Z7901 Long term (current) use of anticoagulants: Secondary | ICD-10-CM

## 2018-05-02 DIAGNOSIS — I5023 Acute on chronic systolic (congestive) heart failure: Secondary | ICD-10-CM

## 2018-05-02 LAB — I-STAT TROPONIN, ED: Troponin i, poc: 0.25 ng/mL (ref 0.00–0.08)

## 2018-05-02 LAB — I-STAT CHEM 8, ED
BUN: 34 mg/dL — ABNORMAL HIGH (ref 6–20)
CHLORIDE: 98 mmol/L — AB (ref 101–111)
CREATININE: 6.2 mg/dL — AB (ref 0.61–1.24)
Calcium, Ion: 0.98 mmol/L — ABNORMAL LOW (ref 1.15–1.40)
GLUCOSE: 249 mg/dL — AB (ref 65–99)
HEMATOCRIT: 39 % (ref 39.0–52.0)
Hemoglobin: 13.3 g/dL (ref 13.0–17.0)
POTASSIUM: 3.5 mmol/L (ref 3.5–5.1)
Sodium: 138 mmol/L (ref 135–145)
TCO2: 28 mmol/L (ref 22–32)

## 2018-05-02 LAB — CBC
HEMATOCRIT: 38 % — AB (ref 39.0–52.0)
Hemoglobin: 13 g/dL (ref 13.0–17.0)
MCH: 33.7 pg (ref 26.0–34.0)
MCHC: 34.2 g/dL (ref 30.0–36.0)
MCV: 98.4 fL (ref 78.0–100.0)
Platelets: 232 10*3/uL (ref 150–400)
RBC: 3.86 MIL/uL — ABNORMAL LOW (ref 4.22–5.81)
RDW: 17.3 % — ABNORMAL HIGH (ref 11.5–15.5)
WBC: 7.9 10*3/uL (ref 4.0–10.5)

## 2018-05-02 LAB — BASIC METABOLIC PANEL
Anion gap: 15 (ref 5–15)
BUN: 29 mg/dL — ABNORMAL HIGH (ref 6–20)
CHLORIDE: 97 mmol/L — AB (ref 101–111)
CO2: 28 mmol/L (ref 22–32)
Calcium: 8.8 mg/dL — ABNORMAL LOW (ref 8.9–10.3)
Creatinine, Ser: 6.22 mg/dL — ABNORMAL HIGH (ref 0.61–1.24)
GFR calc Af Amer: 11 mL/min — ABNORMAL LOW (ref >60)
GFR calc non Af Amer: 9 mL/min — ABNORMAL LOW (ref >60)
GLUCOSE: 230 mg/dL — AB (ref 65–99)
POTASSIUM: 3.3 mmol/L — AB (ref 3.5–5.1)
Sodium: 140 mmol/L (ref 135–145)

## 2018-05-02 LAB — PROTIME-INR
INR: 1.93
Prothrombin Time: 21.9 seconds — ABNORMAL HIGH (ref 11.4–15.2)

## 2018-05-02 LAB — CBG MONITORING, ED: GLUCOSE-CAPILLARY: 269 mg/dL — AB (ref 65–99)

## 2018-05-02 MED ORDER — CAMPHOR-MENTHOL 0.5-0.5 % EX LOTN
TOPICAL_LOTION | CUTANEOUS | Status: DC | PRN
  Filled 2018-05-02: qty 222

## 2018-05-02 MED ORDER — ACETAMINOPHEN 325 MG PO TABS: 650.0000 mg | ORAL_TABLET | Freq: Four times a day (QID) | ORAL | Status: DC | PRN

## 2018-05-02 MED ORDER — PANTOPRAZOLE SODIUM 40 MG PO TBEC
80.0000 mg | DELAYED_RELEASE_TABLET | Freq: Every day | ORAL | Status: DC
Start: 2018-05-02 — End: 2018-05-03
  Administered 2018-05-03: 80 mg via ORAL
  Filled 2018-05-02: qty 2

## 2018-05-02 MED ORDER — SODIUM CHLORIDE 0.9% FLUSH: 3.0000 mL | INTRAVENOUS | Status: DC | PRN

## 2018-05-02 MED ORDER — PENTOXIFYLLINE ER 400 MG PO TBCR
400.0000 mg | EXTENDED_RELEASE_TABLET | Freq: Every day | ORAL | Status: DC
Start: 2018-05-03 — End: 2018-05-03
  Administered 2018-05-03: 400 mg via ORAL
  Filled 2018-05-02: qty 1

## 2018-05-02 MED ORDER — SEVELAMER CARBONATE 800 MG PO TABS
800.0000 mg | ORAL_TABLET | Freq: Every day | ORAL | Status: DC
  Administered 2018-05-03: 800 mg via ORAL
  Filled 2018-05-02: qty 1

## 2018-05-02 MED ORDER — BICTEGRAVIR-EMTRICITAB-TENOFOV 50-200-25 MG PO TABS
1.0000 | ORAL_TABLET | Freq: Every day | ORAL | Status: DC
  Administered 2018-05-03: 1 via ORAL
  Filled 2018-05-02: qty 1

## 2018-05-02 MED ORDER — INSULIN GLARGINE 100 UNIT/ML ~~LOC~~ SOLN
20.0000 [IU] | Freq: Every day | SUBCUTANEOUS | Status: DC
  Administered 2018-05-02: 20 [IU] via SUBCUTANEOUS
  Filled 2018-05-02 (×2): qty 0.2

## 2018-05-02 MED ORDER — ADENOSINE 6 MG/2ML IV SOLN
6.0000 mg | Freq: Once | INTRAVENOUS | Status: AC
  Administered 2018-05-02: 6 mg via INTRAVENOUS
  Filled 2018-05-02: qty 2

## 2018-05-02 MED ORDER — DILTIAZEM LOAD VIA INFUSION
20.0000 mg | Freq: Once | INTRAVENOUS | Status: AC
  Administered 2018-05-02: 20 mg via INTRAVENOUS
  Filled 2018-05-02: qty 20

## 2018-05-02 MED ORDER — METOPROLOL TARTRATE 5 MG/5ML IV SOLN
INTRAVENOUS | Status: AC
  Administered 2018-05-02: 5 mg via INTRAVENOUS
  Filled 2018-05-02: qty 5

## 2018-05-02 MED ORDER — ACETAMINOPHEN 650 MG RE SUPP: 650.0000 mg | Freq: Four times a day (QID) | RECTAL | Status: DC | PRN

## 2018-05-02 MED ORDER — SODIUM CHLORIDE 0.9% FLUSH: 3.0000 mL | Freq: Two times a day (BID) | INTRAVENOUS | Status: DC

## 2018-05-02 MED ORDER — METOPROLOL TARTRATE 5 MG/5ML IV SOLN
INTRAVENOUS | Status: AC
  Filled 2018-05-02: qty 5

## 2018-05-02 MED ORDER — METOPROLOL TARTRATE 5 MG/5ML IV SOLN: 5.0000 mg | Freq: Once | INTRAVENOUS | Status: DC

## 2018-05-02 MED ORDER — PANCRELIPASE (LIP-PROT-AMYL) 12000-38000 UNITS PO CPEP
12000.0000 [IU] | ORAL_CAPSULE | Freq: Three times a day (TID) | ORAL | Status: DC
  Administered 2018-05-03: 12000 [IU] via ORAL
  Filled 2018-05-02 (×2): qty 1

## 2018-05-02 MED ORDER — SODIUM CHLORIDE 0.9 % IV BOLUS
500.0000 mL | Freq: Once | INTRAVENOUS | Status: AC
  Administered 2018-05-02: 500 mL via INTRAVENOUS

## 2018-05-02 MED ORDER — SILVER SULFADIAZINE 1 % EX CREA: 1.0000 "application " | TOPICAL_CREAM | Freq: Every day | CUTANEOUS | Status: DC

## 2018-05-02 MED ORDER — DILTIAZEM HCL-DEXTROSE 100-5 MG/100ML-% IV SOLN (PREMIX)
5.0000 mg/h | INTRAVENOUS | Status: DC
  Administered 2018-05-02: 5 mg/h via INTRAVENOUS
  Filled 2018-05-02 (×2): qty 100

## 2018-05-02 MED ORDER — CALCIUM POLYCARBOPHIL 625 MG PO TABS
625.0000 mg | ORAL_TABLET | Freq: Every day | ORAL | Status: DC
  Administered 2018-05-03: 625 mg via ORAL
  Filled 2018-05-02: qty 1

## 2018-05-02 MED ORDER — SODIUM CHLORIDE 0.9% FLUSH
3.0000 mL | Freq: Two times a day (BID) | INTRAVENOUS | Status: DC
  Administered 2018-05-03: 3 mL via INTRAVENOUS

## 2018-05-02 MED ORDER — INSULIN ASPART 100 UNIT/ML ~~LOC~~ SOLN
0.0000 [IU] | Freq: Three times a day (TID) | SUBCUTANEOUS | Status: DC
  Administered 2018-05-03: 3 [IU] via SUBCUTANEOUS
  Administered 2018-05-03: 2 [IU] via SUBCUTANEOUS

## 2018-05-02 MED ORDER — CADEXOMER IODINE 0.9 % EX GEL: 1.0000 "application " | Freq: Every day | CUTANEOUS | Status: DC | PRN

## 2018-05-02 MED ORDER — METOPROLOL TARTRATE 5 MG/5ML IV SOLN
5.0000 mg | Freq: Once | INTRAVENOUS | Status: AC
  Administered 2018-05-02 (×2): 5 mg via INTRAVENOUS

## 2018-05-02 MED ORDER — SENNOSIDES-DOCUSATE SODIUM 8.6-50 MG PO TABS: 1.0000 | ORAL_TABLET | Freq: Every evening | ORAL | Status: DC | PRN

## 2018-05-02 MED ORDER — ADENOSINE 6 MG/2ML IV SOLN
12.0000 mg | Freq: Once | INTRAVENOUS | Status: AC
  Administered 2018-05-02: 12 mg via INTRAVENOUS
  Filled 2018-05-02: qty 4

## 2018-05-02 MED ORDER — SODIUM CHLORIDE 0.9 % IV SOLN: 250.0000 mL | INTRAVENOUS | Status: DC | PRN

## 2018-05-02 NOTE — H&P (Signed)
Date: 05/02/2018               Patient Name:  Thomas [name] MRN: [id number]  DOB: July 20, 1966 Age / Sex: 52 y.o., male   PCP: [name], MD         Medical Service: Internal Medicine Teaching Service         Attending Physician: Dr. Oval Linsey, MD    First Contact: Dr. Thomasene Ripple  Pager: [phone]  Second Contact: Dr. Cooper Render  Pager: [phone]       After Hours (After 5p/  First Contact Pager: [phone]  weekends / holidays): Second Contact Pager: [phone]   Chief Complaint: Chest pain   History of Present Illness: Thomas [name] is a 52 yo male with history of ESRD on HD TThS, HFrEF with EF 35% 2/2 NICM, HTN, well controlled HIV, T2DM, bilateral BKAs, and history of SVT s/p ablation in 2016 an on Coumadin who presents to the ED from outpatient HD with tachycardia. Patient states he woke up at [facility] with chest pain associated with difficulty breathing. Denies radiation of pain, N/V, diaphoresis, or dizziness. The pain resolved spontaneously and he is not sure how long it lasted. After HD, he was noted to be persistently tachycardic with HR in 150-160s and was sent to the ER for further evaluation. He does have a history of SVT 3 years ago thought to be Afib vs A flutter and had TEE with DCCV in [place] in 2016. Since then he has been on several different medications for this, but unable to recall which ones. He has otherwise been feeling well and denies recent illness or changes in medications. He only endorses a morning HA for the past week that lasts for 2 hours and resolved with Aleve. Denies ongoing chest pain, SOB, abdominal pain, N/V, and HA. Does have constipation at baseline.   ED course:  On arrival patient was tachycardic with HR 160s, afebrile, and normotensive. Labwork unremarkable. I-stat troponin 0.25. CXR with no acute process. He received adenosine x2, IV metoprolol x2, and was started on Cardizem gtt.    Meds:  Current Meds  Medication Sig  .  bictegravir-emtricitabine-tenofovir AF (BIKTARVY) 50-200-25 MG TABS tablet Take 1 tablet by mouth daily.  . metoprolol tartrate (LOPRESSOR) 25 MG tablet Take 1 tablet (25 mg total) by mouth 2 (two) times daily.     Allergies: Allergies as of 05/02/2018 - Review Complete 05/02/2018  Allergen Reaction Noted  . Oxycodone-acetaminophen  01/13/2018   Past Medical History:  Diagnosis Date  . Anemia   . Chronic combined systolic and diastolic CHF (congestive heart failure) ([place])   . Diabetes ([place])   . Diabetic foot ulcer ([place]) 09/03/2017  . Diabetic wet gangrene of the foot ([place]) 09/04/2017  . ESRD (end stage renal disease) [facility])    [facility] [address] T, Th, Sat  . HIV disease ([place])   . Hyperparathyroidism, secondary ([place])   . Hypertension   . Hypertensive heart disease with end stage renal disease on dialysis ([place]) 03/13/2017  . Subacute osteomyelitis, right ankle and foot ([place])   . SVT (supraventricular tachycardia) ([place])    ? afib or atrial flutter s/p TEE/DCCV with subsequent ablation due to reoccurrence in [place]  . Type 2 diabetes mellitus (HCC)     Family History:  Family History  Problem Relation Age of Onset  . Heart disease Mother   . Diabetes Mother   . Heart disease Father   . Cancer  Neg Hx     Social History:  Social History   Tobacco Use  . Smoking status: Never Smoker  . Smokeless tobacco: Never Used  Substance Use Topics  . Alcohol use: No  . Drug use: No    Review of Systems: A complete ROS was negative except as per HPI.   Physical Exam: Blood pressure 117/87, pulse (!) 149, temperature 98.4 F (36.9 C), temperature source Oral, resp. rate (!) 21, SpO2 98 %.  Physical Exam  Constitutional: He is oriented to person, place, and time.  Obese male, well-developed, resting comfortably in bed in no acute distress   HENT:  Head: Normocephalic and atraumatic.  Mouth/Throat: Oropharynx is clear and moist.  Eyes: No scleral icterus.  Neck: Normal range of  motion. Neck supple.  Cardiovascular: Normal rate, regular rhythm and normal heart sounds. Exam reveals no gallop and no friction rub.  No murmur heard. Pulmonary/Chest: Effort normal and breath sounds normal. No respiratory distress. He has no wheezes. He has no rales.  Abdominal: Soft. Bowel sounds are normal. He exhibits no distension. There is no tenderness.  Musculoskeletal: He exhibits no edema or tenderness.  Bilateral BKAs  Neurological: He is alert and oriented to person, place, and time.  Skin: No rash noted.    EKG: personally reviewed my interpretation is atrial fibrillation with RVR, no signs of acute ischemia   CXR: personally reviewed my interpretation is unremarkable, no consolidations, opacities, or effusions   Assessment & Plan by Problem: Principal Problem:   Atrial fibrillation with RVR (HCC) Active Problems:   Hypertensive heart disease with end stage renal disease on dialysis (HCC)   Type 2 diabetes mellitus with complication (HCC)   HIV disease (HCC)   Non-ischemic cardiomyopathy (HCC)   S/P bilateral BKA (below knee amputation) ([place])   Anemia of chronic disease   Morbid obesity (HCC)  Thomas [name] is a 52 yo male with history of ESRD on HD TThS, HFrEF with EF 35% 2/2 NICM, HTN, well controlled HIV, T2DM, bilateral BKAs, and history of SVT s/p TEE and DCCV 2016 who presents to the ED from outpatient HD with tachycardia and found to be in Afib with RVR.   # Afib with RVR: Patient has a history of Afib s/p ablation in 2016. He has had no episodes since then. On metoprolol at home. States he previously was on several medications that did not help maintain rate control. He presents today in Afib with RVR after HD session. Unclear trigger at this time. On metoprolol at home and complaint. No recent illness or changes in medications. Broad HR range at this time 90s-140s while on Cardizem gtt.  - Stepdown  - Cardizem gtt, may need amiodarone - Consider cardiology  consult in AM if remains in RVR  - Holding home Coumadin, on heparin gtt  - Holding home metop, may resume tomorrow  - Trending troponin - Repeat EKG in AM  - F/u TSH   # HTN: on lisinopril, amlodipine, and metoprolol at home. Currently hypotensive while on Diltiazem gtt.  - Holding home meds in the setting of hypotension   # HFrEF 2/2 NICM: EF 35% in 04/2017. Appears euvolemic on exam. Does not follow up with cardiology or HF.   - Daily weights  - No I/Os as patient is anuric   # ESRD: HD TThS - Completed HD session 5/4 - Nephrology consult in AM for HD on 5/6 - Continue home sevelamer   # HIV, well controlled: HIV undetectable and  CD4 610 - Continue home [place]   # T2DM: insulin dependent. A1c 7.7 08/2017. On Lantus 30 units at home  - Lantus 20U + SSI-S  - CBG monitoring '  # GERD:  - Continue home Protonix 80 mg QD   # Bilateral BKAs: most recent one was R BKA on 12/06/2017 - Continue home Silvadene cream and [place]   # Constipation:  - Continue home Fibercon  - Senokot   F: KVO E: monitoring  N: Renal/CM --> NPO at MN   VTE ppx: Heparin gtt   Code status: Full code, not confirmed on admission   Dispo: Admit patient to Inpatient with expected length of stay greater than 2 midnights.  SignedWelford Roche, MD 05/02/2018, 9:46 PM  Pager: [phone]

## 2018-05-02 NOTE — Plan of Care (Signed)
  Problem: Education: Goal: Knowledge of General Education information will improve Outcome: Completed/Met

## 2018-05-02 NOTE — ED Triage Notes (Addendum)
Pt states chest pain 1 week ago that went away. Pt states he woke up this morning with chest pain to central chest that went away during dialysis. Completed his full tx. HR was 160s during dialysis. Pt denies chest pain currently but still feels dizzy. Pt very irritable in triage.

## 2018-05-02 NOTE — ED Provider Notes (Signed)
[facility] EMERGENCY DEPARTMENT Provider Note   CSN: [id number] Arrival date & time: 05/02/18  1721     History   Chief Complaint Chief Complaint  Patient presents with  . Chest Pain    dialysis pt  . Dizziness    HPI Thomas [name] is a 52 y.o. male.  HPI   He complains of chest pain and fast heart beat which started during his dialysis treatment today.  He states he was able to complete the full treatment and was then sent here by private vehicle for evaluation.  History of similar problem in the past.  Onset of discomfort during his dialysis today.  History similar problem in the past, told that he had SVT, and treated with medication.  He denies recent fever, chills, nausea, vomiting, cough, shortness of breath, dizziness or weakness.  There are no other known modifying factors.   Past Medical History:  Diagnosis Date  . Anemia   . Chronic combined systolic and diastolic CHF (congestive heart failure) ([place])   . Diabetes ([place])   . Diabetic foot ulcer ([place]) 09/03/2017  . Diabetic wet gangrene of the foot ([place]) 09/04/2017  . ESRD (end stage renal disease) [facility])    [address] T, Th, Sat  . HIV disease ([place])   . Hyperparathyroidism, secondary ([place])   . Hypertension   . Hypertensive heart disease with end stage renal disease on dialysis ([place]) 03/13/2017  . Subacute osteomyelitis, right ankle and foot ([place])   . SVT (supraventricular tachycardia) ([place])    ? afib or atrial flutter s/p TEE/DCCV with subsequent ablation due to reoccurrence in [place]  . Type 2 diabetes mellitus [facility])     Patient Active Problem List   Diagnosis Date Noted  . Murmur, cardiac 02/13/2018  . Mallory-Weiss tear 01/16/2018  . Morbid obesity ([place])   . Fall   . Subtherapeutic international normalized ratio (INR)   . History of left below knee amputation ([place]) 12/11/2017  . History of supraventricular tachycardia   . Chronic diastolic congestive heart failure ([place])   . S/P  bilateral BKA (below knee amputation) ([place])   . Anemia of chronic disease   . Leukocytosis   . Non-ischemic cardiomyopathy ([place])   . Hypokalemia 09/04/2017  . Essential hypertension 09/03/2017  . GERD (gastroesophageal reflux disease) 09/03/2017  . GIB (gastrointestinal bleeding) 07/26/2017  . Anemia due to end stage renal disease ([place]) 07/26/2017  . PAF (paroxysmal atrial fibrillation) ([place]) 07/26/2017  . Symptomatic anemia 07/26/2017  . ESRD (end stage renal disease) ([place]) 05/05/2017  . HIV disease ([place]) 03/25/2017  . Hypertensive heart disease with end stage renal disease on dialysis ([place]) 03/13/2017  . ESRD on dialysis ([place]) 03/13/2017  . Type 2 diabetes mellitus with complication ([place]) 86/57/8469  . SVT (supraventricular tachycardia) ([facility])     Past Surgical History:  Procedure Laterality Date  . AMPUTATION Left    foot  . AMPUTATION Right 12/06/2017   Procedure: AMPUTATION BELOW KNEE;  Surgeon: [name], MD;  Location: [place];  Service: Orthopedics;  Laterality: Right;  . APPLICATION OF WOUND VAC Right 09/04/2017   Procedure: APPLICATION OF WOUND VAC;  Surgeon: [name], DPM;  Location: [place];  Service: Podiatry;  Laterality: Right;  . APPLICATION OF WOUND VAC  12/06/2017   Procedure: APPLICATION OF WOUND VAC;  Surgeon: [name], MD;  Location: [place];  Service: Orthopedics;;  . COLONOSCOPY WITH PROPOFOL N/A 07/30/2017   Procedure: COLONOSCOPY WITH PROPOFOL;  Surgeon: [name], MD;  Location[facility] ENDOSCOPY;  Service: Endoscopy;  Laterality: N/A;  . ESOPHAGOGASTRODUODENOSCOPY (EGD) WITH PROPOFOL N/A 07/28/2017   Procedure: ESOPHAGOGASTRODUODENOSCOPY (EGD) WITH PROPOFOL;  Surgeon: [name], MD;  Location: [place];  Service: Endoscopy;  Laterality: N/A;  . ESOPHAGOGASTRODUODENOSCOPY (EGD) WITH PROPOFOL N/A 01/15/2018   Procedure: ESOPHAGOGASTRODUODENOSCOPY (EGD) WITH PROPOFOL;  Surgeon: [name], MD;  Location: [facility] ENDOSCOPY;  Service: Endoscopy;   Laterality: N/A;  . FLEXIBLE SIGMOIDOSCOPY N/A 07/27/2017   Procedure: FLEXIBLE SIGMOIDOSCOPY;  Surgeon: [name], MD;  Location: [facility] ENDOSCOPY;  Service: Endoscopy;  Laterality: N/A;  . GRAFT APPLICATION Right 67/89/3810   Procedure: SKIN GRAFT APPLICATION RIGHT FOOT;  Surgeon: [name], DPM;  Location: [place];  Service: Podiatry;  Laterality: Right;  . I&D EXTREMITY Right 09/04/2017   Procedure: IRRIGATION AND DEBRIDEMENT EXTREMITY;  Surgeon: [name], DPM;  Location: [place];  Service: Podiatry;  Laterality: Right;  . I&D EXTREMITY Right 11/12/2017   Procedure: IRRIGATION AND DEBRIDEMENT ULCER RIGHT FOOT;  Surgeon: [name], DPM;  Location: [place];  Service: Podiatry;  Laterality: Right;  . RIGHT AV FISTULA PLACEMENT  10/19/2012  . WOUND DEBRIDEMENT N/A 09/24/2017   Procedure: DEBRIDEMENT WOUND;  Surgeon: [name], DPM;  Location: [place];  Service: Podiatry;  Laterality: N/A;        Home Medications    Prior to Admission medications   Medication Sig Start Date End Date Taking? Authorizing Provider  amLODipine (NORVASC) 5 MG tablet Take 1 tablet (5 mg total) by mouth daily. 12/19/17   Angiulli, Lavon Paganini, PA-C  bictegravir-emtricitabine-tenofovir AF (BIKTARVY) 50-200-25 MG TABS tablet Take 1 tablet by mouth daily. 03/02/18   Carlyle Basques, MD  cadexomer iodine (IODOSORB) 0.9 % gel Apply 1 application topically daily as needed for wound care. 03/02/18   [name], MD  camphor-menthol [facility]) lotion Apply topically as needed for itching. 12/11/17   Colbert Ewing, MD  doxycycline (VIBRA-TABS) 100 MG tablet Take 1 tablet (100 mg total) by mouth daily. 02/16/18   [name], MD  ferric gluconate 125 mg in sodium chloride 0.9 % 100 mL Inject 125 mg into the vein Every Tuesday,Thursday,and Saturday with dialysis. 05/10/17   Hosie Poisson, MD  gabapentin (NEURONTIN) 100 MG capsule Take 1 capsule (100 mg total) by mouth every dialysis. 01/09/18   Jamse Arn, MD  insulin glargine (LANTUS) 100 unit/mL SOPN Inject 0.3 mLs (30 Units total) into the skin at bedtime. 12/19/17   Angiulli, Lavon Paganini, PA-C  lipase/protease/amylase (CREON) 12000 units CPEP capsule Take 1 capsule (12,000 Units total) by mouth 3 (three) times daily before meals. 12/19/17   Angiulli, Lavon Paganini, PA-C  lisinopril (PRINIVIL,ZESTRIL) 40 MG tablet Take 1 tablet (40 mg total) by mouth daily. 12/19/17   Angiulli, Lavon Paganini, PA-C  metoprolol tartrate (LOPRESSOR) 25 MG tablet Take 1 tablet (25 mg total) by mouth 2 (two) times daily. 03/31/18   Bartholomew Crews, MD  multivitamin (RENA-VIT) TABS tablet Take 1 tablet by mouth at bedtime. 12/19/17   Angiulli, Lavon Paganini, PA-C  naproxen sodium (ALEVE) 220 MG tablet Take 440 mg by mouth as needed (pain).    [provider]  pantoprazole (PROTONIX) 40 MG tablet Take 2 tablets (80 mg total) by mouth daily. 02/16/18   Alphonzo Grieve, MD  pentoxifylline (TRENTAL) 400 MG CR tablet Take 1 tablet (400 mg total) by mouth daily. 03/02/18   [name], MD  polycarbophil (FIBERCON) [phone]  MG tablet Take 1 tablet (625 mg total) by mouth daily. 12/19/17   Angiulli, Lavon Paganini, PA-C  sevelamer carbonate (RENVELA) 800 MG tablet Take 800 mg by mouth daily. 01/10/18   [provider]  silver sulfADIAZINE (SILVADENE) 1 % cream Apply 1 application topically daily. 01/02/18   Suzan Slick, NP  silver sulfADIAZINE (SILVADENE) 1 % cream Apply 1 application topically daily. Apply to affected area daily plus dry dressing 03/02/18   [name], MD  warfarin (COUMADIN) 10 MG tablet Take 1 tablet (10 mg total) by mouth See admin instructions. Take 10 mg by mouth daily Sunday, Monday, Wednesday and Friday 01/23/18   [name], MD  warfarin (COUMADIN) 7.5 MG tablet Take 1 tablet (7.5 mg total) by mouth See admin instructions. Take 7.5 mg by mouth daily on Tuesday, Thursday ans Saturday 01/24/18   [name], MD    Family History Family History    Problem Relation Age of Onset  . Heart disease Mother   . Diabetes Mother   . Heart disease Father   . Cancer Neg Hx     Social History Social History   Tobacco Use  . Smoking status: Never Smoker  . Smokeless tobacco: Never Used  Substance Use Topics  . Alcohol use: No  . Drug use: No     Allergies   Oxycodone-acetaminophen   Review of Systems Review of Systems  All other systems reviewed and are negative.    Physical Exam Updated Vital Signs BP (!) 130/116   Pulse (!) 158   Temp 98.4 F (36.9 C) (Oral)   Resp 20   SpO2 95%   Physical Exam  Constitutional: He is oriented to person, place, and time. He appears well-developed and well-nourished.  HENT:  Head: Normocephalic and atraumatic.  Right Ear: External ear normal.  Left Ear: External ear normal.  Eyes: Pupils are equal, round, and reactive to light. Conjunctivae and EOM are normal.  Neck: Normal range of motion and phonation normal. Neck supple.  Cardiovascular:  Regular tachycardia on arrival.  Vascular dialysis access right forearm with normal pulse.  Pulmonary/Chest: Effort normal and breath sounds normal. No respiratory distress. He exhibits no bony tenderness.  Abdominal: Soft. There is no tenderness.  Musculoskeletal: Normal range of motion. He exhibits no edema or deformity.  Bilateral below-knee amputations wearing prostheses  Neurological: He is alert and oriented to person, place, and time. No cranial nerve deficit or sensory deficit. He exhibits normal muscle tone. Coordination normal.  Skin: Skin is warm, dry and intact.  Psychiatric: He has a normal mood and affect. His behavior is normal. Judgment and thought content normal.  Nursing note and vitals reviewed.    ED Treatments / Results  Labs (all labs ordered are listed, but only abnormal results are displayed) Labs Reviewed  BASIC METABOLIC PANEL - Abnormal; Notable for the following components:      Result Value   Potassium 3.3  (*)    Chloride 97 (*)    Glucose, Bld 230 (*)    BUN 29 (*)    Creatinine, Ser 6.22 (*)    Calcium 8.8 (*)    GFR calc non Af Amer 9 (*)    GFR calc Af Amer 11 (*)    All other components within normal limits  CBC - Abnormal; Notable for the following components:   RBC 3.86 (*)    HCT 38.0 (*)    RDW 17.3 (*)    All other components within  normal limits  I-STAT TROPONIN, ED - Abnormal; Notable for the following components:   Troponin i, poc 0.25 (*)    All other components within normal limits  I-STAT CHEM 8, ED - Abnormal; Notable for the following components:   Chloride 98 (*)    BUN 34 (*)    Creatinine, Ser 6.20 (*)    Glucose, Bld 249 (*)    Calcium, Ion 0.98 (*)    All other components within normal limits    EKG EKG Interpretation  Date/Time:  Saturday May 02 2018 18:00:02 EDT Ventricular Rate:  162 PR Interval:    QRS Duration: 96 QT Interval:  298 QTC Calculation: 489 R Axis:   15 Text Interpretation:  Supraventricular tachycardia Otherwise normal ECG Since last tracing rate faster Confirmed by Daleen Bo [phone]) on 05/02/2018 6:21:02 PM    Radiology Dg Chest Port 1 View  Result Date: 05/02/2018 CLINICAL DATA:  Chest pain and tachycardia. EXAM: PORTABLE CHEST 1 VIEW COMPARISON:  Chest x-ray dated January 13, 2018. FINDINGS: The heart size and mediastinal contours are within normal limits. Both lungs are clear. The visualized skeletal structures are unremarkable. IMPRESSION: No active disease. Electronically Signed   By: Titus Dubin M.D.   On: 05/02/2018 18:34    Procedures .Critical Care Performed by: Daleen Bo, MD Authorized by: Daleen Bo, MD   Critical care provider statement:    Critical care time (minutes):  65   Critical care start time:  05/02/2018 5:21 PM   Critical care end time:  05/02/2018 7:27 PM   Critical care time was exclusive of:  Separately billable procedures and treating other patients   Critical care was necessary to  treat or prevent imminent or life-threatening deterioration of the following conditions:  Cardiac failure   Critical care was time spent personally by me on the following activities:  Blood draw for specimens, development of treatment plan with patient or surrogate, discussions with consultants, evaluation of patient's response to treatment, examination of patient, obtaining history from patient or surrogate, ordering and performing treatments and interventions, ordering and review of laboratory studies, pulse oximetry, re-evaluation of patient's condition, review of old charts and ordering and review of radiographic studies   (including critical care time)  Medications Ordered in ED Medications  metoprolol tartrate (LOPRESSOR) injection 5 mg (has no administration in time range)  metoprolol tartrate (LOPRESSOR) 5 MG/5ML injection (has no administration in time range)  diltiazem (CARDIZEM) 1 mg/mL load via infusion 20 mg (has no administration in time range)    And  diltiazem (CARDIZEM) 100 mg in dextrose 5% 160mL (1 mg/mL) infusion (has no administration in time range)  sodium chloride 0.9 % bolus 500 mL (500 mLs Intravenous New Bag/Given 05/02/18 1823)  adenosine (ADENOCARD) 6 MG/2ML injection 6 mg (6 mg Intravenous Given 05/02/18 1837)  adenosine (ADENOCARD) 6 MG/2ML injection 12 mg (12 mg Intravenous Given 05/02/18 1839)  metoprolol tartrate (LOPRESSOR) injection 5 mg (5 mg Intravenous Given 05/02/18 1909)     Initial Impression / Assessment and Plan / ED Course  I have reviewed the triage vital signs and the nursing notes.  Pertinent labs & imaging results that were available during my care of the patient were reviewed by me and considered in my medical decision making (see chart for details).  Clinical Course as of May 02 1924  Sat May 02, 2018  1916 Normal except chloride and calcium low; BUN, creatinine, and glucose high  I-stat Chem 8, ED(!) [EW]  1918 Elevated  I-stat troponin, ED(!!)  [EW]  1918 Abnormalities similar to i-STAT 8  Basic metabolic panel(!) [EW]  6160 Normal except hematocrit slightly low  CBC(!) [EW]  1919 No acute disease, images reviewed  DG Chest Port 1 View [EW]  1919 Conversion from SVT to atrial fibrillation with RVR, at 19: 13.  Cardizem drip ordered.   [EW]    Clinical Course User Index [EW] Daleen Bo, MD     Patient Vitals for the past 24 hrs:  BP Temp Temp src Pulse Resp SpO2  05/02/18 1845 (!) 130/116 - - (!) 158 20 95 %  05/02/18 1830 (!) 108/96 - - 80 17 95 %  05/02/18 1821 (!) 125/104 - - 68 (!) 24 98 %  05/02/18 1749 90/76 98.4 F (36.9 C) Oral (!) 162 18 100 %    7:25 PM Reevaluation with update and discussion. After initial assessment and treatment, an updated evaluation reveals comfortable at this time, denies chest pain or shortness of breath.  Heart rate 125 to 130 bpm, irregular.  Findings discussed with patient and family member, all questions answered. [place] decision making-tachycardia SVT on arrival, converted to atrial fibrillation with RVR after treatment.  Mild elevation of troponin, likely related to both renal failure, and troponin leak from tachycardia.  Doubt ACS, PE or pneumonia.  Patient has end-stage renal disease.  He will need to be monitored closely and have further work-up.  Patient was dialyzed today and does not likely need dialysis within the next 24 to 48 hours.  Nursing Notes Reviewed/ Care Coordinated Applicable Imaging Reviewed Interpretation of Laboratory Data incorporated into ED treatment   7:29 PM-Consult complete with resident from primary care service. Patient case explained and discussed.  He agrees to admit patient for further evaluation and treatment. Call ended at 7:35 PM  Plan: Admit      Final Clinical Impressions(s) / ED Diagnoses   Final diagnoses:  Atrial fibrillation with rapid ventricular response [facility])    ED Discharge Orders    None       Daleen Bo, MD 05/02/18 2204

## 2018-05-03 DIAGNOSIS — I480 Paroxysmal atrial fibrillation: Principal | ICD-10-CM

## 2018-05-03 DIAGNOSIS — Z79899 Other long term (current) drug therapy: Secondary | ICD-10-CM

## 2018-05-03 DIAGNOSIS — I248 Other forms of acute ischemic heart disease: Secondary | ICD-10-CM

## 2018-05-03 DIAGNOSIS — Z8249 Family history of ischemic heart disease and other diseases of the circulatory system: Secondary | ICD-10-CM

## 2018-05-03 DIAGNOSIS — I252 Old myocardial infarction: Secondary | ICD-10-CM

## 2018-05-03 DIAGNOSIS — K219 Gastro-esophageal reflux disease without esophagitis: Secondary | ICD-10-CM | POA: Diagnosis not present

## 2018-05-03 DIAGNOSIS — N186 End stage renal disease: Secondary | ICD-10-CM | POA: Diagnosis not present

## 2018-05-03 DIAGNOSIS — I132 Hypertensive heart and chronic kidney disease with heart failure and with stage 5 chronic kidney disease, or end stage renal disease: Secondary | ICD-10-CM | POA: Diagnosis not present

## 2018-05-03 DIAGNOSIS — Z89512 Acquired absence of left leg below knee: Secondary | ICD-10-CM

## 2018-05-03 DIAGNOSIS — E1151 Type 2 diabetes mellitus with diabetic peripheral angiopathy without gangrene: Secondary | ICD-10-CM | POA: Diagnosis not present

## 2018-05-03 DIAGNOSIS — R011 Cardiac murmur, unspecified: Secondary | ICD-10-CM | POA: Diagnosis not present

## 2018-05-03 DIAGNOSIS — B20 Human immunodeficiency virus [HIV] disease: Secondary | ICD-10-CM | POA: Diagnosis not present

## 2018-05-03 DIAGNOSIS — I4891 Unspecified atrial fibrillation: Secondary | ICD-10-CM | POA: Diagnosis not present

## 2018-05-03 DIAGNOSIS — I5022 Chronic systolic (congestive) heart failure: Secondary | ICD-10-CM | POA: Diagnosis not present

## 2018-05-03 DIAGNOSIS — I5042 Chronic combined systolic (congestive) and diastolic (congestive) heart failure: Secondary | ICD-10-CM | POA: Diagnosis not present

## 2018-05-03 DIAGNOSIS — I5023 Acute on chronic systolic (congestive) heart failure: Secondary | ICD-10-CM

## 2018-05-03 DIAGNOSIS — Z8679 Personal history of other diseases of the circulatory system: Secondary | ICD-10-CM

## 2018-05-03 DIAGNOSIS — R000 Tachycardia, unspecified: Secondary | ICD-10-CM | POA: Diagnosis not present

## 2018-05-03 DIAGNOSIS — Z7901 Long term (current) use of anticoagulants: Secondary | ICD-10-CM

## 2018-05-03 DIAGNOSIS — Z992 Dependence on renal dialysis: Secondary | ICD-10-CM

## 2018-05-03 DIAGNOSIS — E1122 Type 2 diabetes mellitus with diabetic chronic kidney disease: Secondary | ICD-10-CM | POA: Diagnosis not present

## 2018-05-03 DIAGNOSIS — K5900 Constipation, unspecified: Secondary | ICD-10-CM | POA: Diagnosis not present

## 2018-05-03 DIAGNOSIS — Z885 Allergy status to narcotic agent status: Secondary | ICD-10-CM

## 2018-05-03 DIAGNOSIS — Z794 Long term (current) use of insulin: Secondary | ICD-10-CM

## 2018-05-03 DIAGNOSIS — Z89511 Acquired absence of right leg below knee: Secondary | ICD-10-CM

## 2018-05-03 LAB — BASIC METABOLIC PANEL
ANION GAP: 14 (ref 5–15)
BUN: 40 mg/dL — ABNORMAL HIGH (ref 6–20)
CALCIUM: 8.4 mg/dL — AB (ref 8.9–10.3)
CO2: 25 mmol/L (ref 22–32)
CREATININE: 7.82 mg/dL — AB (ref 0.61–1.24)
Chloride: 98 mmol/L — ABNORMAL LOW (ref 101–111)
GFR calc Af Amer: 8 mL/min — ABNORMAL LOW (ref >60)
GFR, EST NON AFRICAN AMERICAN: 7 mL/min — AB (ref >60)
GLUCOSE: 259 mg/dL — AB (ref 65–99)
Potassium: 3.4 mmol/L — ABNORMAL LOW (ref 3.5–5.1)
Sodium: 137 mmol/L (ref 135–145)

## 2018-05-03 LAB — CBC
HCT: 33.8 % — ABNORMAL LOW (ref 39.0–52.0)
HEMOGLOBIN: 11.2 g/dL — AB (ref 13.0–17.0)
MCH: 32.7 pg (ref 26.0–34.0)
MCHC: 33.1 g/dL (ref 30.0–36.0)
MCV: 98.5 fL (ref 78.0–100.0)
PLATELETS: 212 10*3/uL (ref 150–400)
RBC: 3.43 MIL/uL — ABNORMAL LOW (ref 4.22–5.81)
RDW: 17.1 % — ABNORMAL HIGH (ref 11.5–15.5)
WBC: 6.5 10*3/uL (ref 4.0–10.5)

## 2018-05-03 LAB — TROPONIN I
Troponin I: 0.27 ng/mL (ref <0.03)
Troponin I: 0.33 ng/mL (ref <0.03)
Troponin I: 0.36 ng/mL (ref <0.03)
Troponin I: 0.28 ng/mL (ref <0.03)

## 2018-05-03 LAB — MAGNESIUM: MAGNESIUM: 2.1 mg/dL (ref 1.7–2.4)

## 2018-05-03 LAB — TSH: TSH: 1.348 u[IU]/mL (ref 0.350–4.500)

## 2018-05-03 LAB — GLUCOSE, CAPILLARY
GLUCOSE-CAPILLARY: 184 mg/dL — AB (ref 65–99)
Glucose-Capillary: 250 mg/dL — ABNORMAL HIGH (ref 65–99)
Glucose-Capillary: 245 mg/dL — ABNORMAL HIGH (ref 65–99)

## 2018-05-03 LAB — HEPARIN LEVEL (UNFRACTIONATED): Heparin Unfractionated: 0.1 IU/mL — ABNORMAL LOW (ref 0.30–0.70)

## 2018-05-03 LAB — MRSA PCR SCREENING: MRSA by PCR: NEGATIVE

## 2018-05-03 MED ORDER — HEPARIN SODIUM (PORCINE) 1000 UNIT/ML DIALYSIS: 1000.0000 [IU] | INTRAMUSCULAR | Status: DC | PRN

## 2018-05-03 MED ORDER — WARFARIN - PHARMACIST DOSING INPATIENT: Freq: Every day | Status: DC

## 2018-05-03 MED ORDER — WARFARIN SODIUM 7.5 MG PO TABS: 7.5000 mg | ORAL_TABLET | Freq: Once | ORAL | Status: DC

## 2018-05-03 MED ORDER — LIDOCAINE-PRILOCAINE 2.5-2.5 % EX CREA
1.0000 "application " | TOPICAL_CREAM | CUTANEOUS | Status: DC | PRN
  Filled 2018-05-03: qty 5

## 2018-05-03 MED ORDER — SODIUM CHLORIDE 0.9 % IV SOLN: 100.0000 mL | INTRAVENOUS | Status: DC | PRN

## 2018-05-03 MED ORDER — PENTAFLUOROPROP-TETRAFLUOROETH EX AERO: 1.0000 "application " | INHALATION_SPRAY | CUTANEOUS | Status: DC | PRN

## 2018-05-03 MED ORDER — HEPARIN (PORCINE) IN NACL 100-0.45 UNIT/ML-% IJ SOLN
1600.0000 [IU]/h | INTRAMUSCULAR | Status: DC
  Administered 2018-05-03: 1600 [IU]/h via INTRAVENOUS
  Filled 2018-05-03: qty 250

## 2018-05-03 MED ORDER — METOPROLOL TARTRATE 50 MG PO TABS
50.0000 mg | ORAL_TABLET | Freq: Two times a day (BID) | ORAL | Status: DC
  Administered 2018-05-03: 50 mg via ORAL
  Filled 2018-05-03: qty 1

## 2018-05-03 MED ORDER — LIDOCAINE HCL (PF) 1 % IJ SOLN: 5.0000 mL | INTRAMUSCULAR | Status: DC | PRN

## 2018-05-03 MED ORDER — DOXERCALCIFEROL 4 MCG/2ML IV SOLN: 5.0000 ug | INTRAVENOUS | Status: DC

## 2018-05-03 MED ORDER — WARFARIN SODIUM 10 MG PO TABS: 10.0000 mg | ORAL_TABLET | Freq: Once | ORAL | Status: DC

## 2018-05-03 MED ORDER — METOPROLOL TARTRATE 50 MG PO TABS: 50.0000 mg | ORAL_TABLET | Freq: Two times a day (BID) | ORAL | 0 refills | Status: DC

## 2018-05-03 NOTE — Discharge Summary (Signed)
Name: Thomas [name] MRN: [id number] DOB: Oct 23, 1966 52 y.o. PCP: [name], MD  Date of Admission: 05/02/2018  5:30 PM Date of Discharge:  Attending Physician: Oval Linsey, MD  Discharge Diagnosis: 1. Atrial fibrillation with RVR  Principal Problem:   Atrial fibrillation with RVR (HCC) Active Problems:   Hypertensive heart disease with end stage renal disease on dialysis [facility])   Type 2 diabetes mellitus with complication (HCC)   HIV disease (HCC)   End stage renal disease ([place])   Non-ischemic cardiomyopathy ([place])   S/P bilateral BKA (below knee amputation) ([place])   Anemia of chronic disease   Morbid obesity ([place])   Chronic systolic heart failure ([place])   Demand ischemia ([place])   Discharge Medications: Allergies as of 05/03/2018      Reactions   Oxycodone-acetaminophen    Hallucinations/delirum      Medication List    STOP taking these medications   amLODipine 5 MG tablet Commonly known as:  NORVASC   cadexomer iodine 0.9 % gel Commonly known as:  IODOSORB   silver sulfADIAZINE 1 % cream Commonly known as:  SILVADENE     TAKE these medications   bictegravir-emtricitabine-tenofovir AF 50-200-25 MG Tabs tablet Commonly known as:  BIKTARVY Take 1 tablet by mouth daily.   camphor-menthol lotion Commonly known as:  SARNA Apply topically as needed for itching.   cinacalcet 90 MG tablet Commonly known as:  SENSIPAR Take 90 mg by mouth daily.   ferric gluconate 125 mg in sodium chloride 0.9 % 100 mL Inject 125 mg into the vein Every Tuesday,Thursday,and Saturday with dialysis.   gabapentin 100 MG capsule Commonly known as:  NEURONTIN Take 1 capsule (100 mg total) by mouth every dialysis.   insulin glargine 100 unit/mL Sopn Commonly known as:  LANTUS Inject 0.3 mLs (30 Units total) into the skin at bedtime. What changed:  how much to take   lipase/protease/amylase 12000 units Cpep capsule Commonly known as:  CREON Take 1 capsule (12,000 Units total)  by mouth 3 (three) times daily before meals.   lisinopril 40 MG tablet Commonly known as:  PRINIVIL,ZESTRIL Take 1 tablet (40 mg total) by mouth daily.   metoprolol tartrate 50 MG tablet Commonly known as:  LOPRESSOR Take 1 tablet (50 mg total) by mouth 2 (two) times daily. What changed:    medication strength  how much to take   multivitamin Tabs tablet Take 1 tablet by mouth at bedtime.   naproxen sodium 220 MG tablet Commonly known as:  ALEVE Take 220 mg by mouth as needed (pain).   pantoprazole 40 MG tablet Commonly known as:  PROTONIX Take 2 tablets (80 mg total) by mouth daily.   pentoxifylline 400 MG CR tablet Commonly known as:  TRENTAL Take 1 tablet (400 mg total) by mouth daily.   polycarbophil 625 MG tablet Commonly known as:  FIBERCON Take 1 tablet (625 mg total) by mouth daily.   sevelamer carbonate 800 MG tablet Commonly known as:  RENVELA Take 800 mg by mouth 3 (three) times daily with meals.   warfarin 10 MG tablet Commonly known as:  COUMADIN Take 1 tablet (10 mg total) by mouth See admin instructions. Take 10 mg by mouth daily Sunday, Monday, Wednesday and Friday What changed:  Another medication with the same name was changed. Make sure you understand how and when to take each.   warfarin 7.5 MG tablet Commonly known as:  COUMADIN Take 1 tablet (7.5 mg total) by mouth See admin instructions.  Take 7.5 mg by mouth daily on Tuesday, Thursday ans Saturday What changed:    when to take this  additional instructions       Disposition and follow-up:   Mr.Rajinder Servello was discharged from [facility] in Good condition.  At the hospital follow up visit please address:  1. Please have your PCP/Nephrologist review the following: #SVT/Atrial Fibrillation: Home metoprolol increased from 25 mg to 50 mg BID. Please assess for adequate rate control. #Hemodialysis: reports of not meeting EDW. HD clinic will call pt 5/6 if additional  appointment is needed. Otherwise, return to normal dialysis schedule.  2.  Labs / imaging needed at time of follow-up: Repeat troponin-it was positive most likely due to demand ischemia secondary to A. fib with RVR-he will have a slow clearance because of his end-stage renal disease.  3.  Pending labs/ test needing follow-up: none  Follow-up Appointments: Patient was advised to make an appointment with PCP within a week.  Hospital Course by problem list: Principal Problem:   Atrial fibrillation with RVR ([place]) Active Problems:   Hypertensive heart disease with end stage renal disease on dialysis (HCC)   Type 2 diabetes mellitus with complication (HCC)   HIV disease (HCC)   End stage renal disease ([place])   Non-ischemic cardiomyopathy ([place])   S/P bilateral BKA (below knee amputation) ([place])   Anemia of chronic disease   Morbid obesity ([place])   Chronic systolic heart failure ([place])   Demand ischemia (HCC)   [name] is a 52 yo male with history of ESRD on HD TThS, HFrEF with EF 35% 2/2 NICM, HTN, well controlled HIV, T2DM, bilateral BKAs, and history of SVT s/p TEE and DCCV 2016 who presented to the ED from outpatient dialysis with intermittent episodic CP and HR in the 160s after routine dialysis.  # Afib with RVR: Mr. Cuffe was in his usual state of health until he was found to have an elevated HR after leaving dialysis. He has a history of Afibs/p ablation in 2016, performed in [place]. EKG on admission revealed atrial fibrillation. Troponins drawn in order of chronicity were: 0.27, 0.33, 0.36, and 0.28, with an understanding that poor renal clearance and anuria may have resulted in these levels being less diagnostically useful. His home beta-blocker and antihypertensives were discontinued and he was placed on a diltiazem drip which caused his rate to normalize at 85-95 bpm. He was placed on a heparin drip overnight due to a potential need for cardiac intervention. TSH levels were  normal. After heart rhythm stabilized to normal sinus with PACs, the heparin and diltiazem drips were discontinued and he was placed back on his home dose of warfarin and an increased dose of metoprolol. He was discharged after stabilizing on this regimen. He should continue taking this increased dose of his beta-blocker (metoprolol 50mg  BID).  # ESRD: Mr. Mcnee typically received hemodialysis Tuesday/Thursday/Saturday at [facility]. He successfully completed his Saturday session before arrival to the ED. Per nephrology, he has not been reaching his EDW recently (approximately +4kg), despite appearing clinically well and euvolemic. Nephrology will notify his outpatient clinic which will follow up should he need dialysis sooner than his next scheduled appointment. Home sevelamer was continued.  DM. He was placed on SSI and 20u for his IDDM while inpatient and home regimens for HIV, HFrEF, GERD, BKA stump healing, and constipation were continued.  HTN.  Patient remained normotensive during hospitalization with initial softer blood pressure. We held his  amlodipine and lisinopril during hospitalization.  He was advised to stop taking amlodipine as metoprolol dose was increased. He can continue lisinopril-which can be titrated as an outpatient if needed.  Discharge Vitals:   BP 126/79 (BP Location: Left Arm)   Pulse 72   Temp 98.8 F (37.1 C) (Oral)   Resp 16   Ht 6\' 6"  (1.981 m)   Wt 269 lb 1.6 oz (122.1 kg)   SpO2 95%   BMI 31.10 kg/m   Pertinent Labs, Studies, and Procedures:  Lab Results  Component Value Date   TROPONINI 0.28 (HH) 05/03/2018   EKG at discharge revealed sinus with PACs.  Discharge Instructions: Discharge Instructions    Diet - low sodium heart healthy   Complete by:  As directed    Discharge instructions   Complete by:  As directed    It was pleasure taking care of you. As we discussed we are increasing the dose of your metoprolol from 25 mg to  50 mg twice a day.  Please start taking your new dose, you can finish up your existing medicine by taking 2 tablets at a time instead of 1. Continue taking your Coumadin as you were doing it before-follow-up in Coumadin clinic for further dose management. Please call your PCP early next week to be seen within a week. If you experience any chest pain or shortness of breath please seek medical attention. You might get a call from your dialysis center tomorrow morning for an extra session to challenge her dry weight.   Increase activity slowly   Complete by:  As directed       Signed: Lorella Nimrod, MD 05/03/2018, 3:50 PM   Pager: [phone]  Attestation for Student Documentation:  I personally was present and performed or re-performed the history, physical exam and medical decision-making activities of this service and have verified that the service and findings are accurately documented in the student's note.  Lorella Nimrod, MD 05/03/2018, 3:53 PM

## 2018-05-03 NOTE — Progress Notes (Signed)
ANTICOAGULATION CONSULT NOTE - Initial Consult  Pharmacy Consult for warfarin  Indication: atrial fibrillation  Allergies  Allergen Reactions  . Oxycodone-Acetaminophen     Hallucinations/delirum    Patient Measurements: Height: 6\' 6"  (198.1 cm) Weight: 269 lb 1.6 oz (122.1 kg) IBW/kg (Calculated) : 91.4 Heparin Dosing Weight:   Vital Signs: Temp: 98.5 F (36.9 C) (05/05 0820) Temp Source: Oral (05/05 0820) BP: 122/61 (05/05 0820) Pulse Rate: 45 (05/05 0820)  Labs: Recent Labs    05/02/18 1800 05/02/18 1833 05/02/18 2302 05/03/18 0423 05/03/18 0839  HGB 13.0 13.3  --  11.2*  --   HCT 38.0* 39.0  --  33.8*  --   PLT 232  --   --  212  --   LABPROT  --   --  21.9*  --   --   INR  --   --  1.93  --   --   HEPARINUNFRC  --   --   --   --  0.10*  CREATININE 6.22* 6.20*  --  7.82*  --   TROPONINI  --   --  0.27* 0.33*  --     Estimated Creatinine Clearance: 16.4 mL/min (A) (by C-G formula based on SCr of 7.82 mg/dL (H)).   Medical History: Past Medical History:  Diagnosis Date  . Anemia   . Chronic combined systolic and diastolic CHF (congestive heart failure) ([place])   . Diabetes ([place])   . Diabetic foot ulcer ([place]) 09/03/2017  . 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 ESRD (end stage renal disease) [facility])    [facility] [address] T, Th, Sat  . HIV disease ([place])   . Hyperparathyroidism, secondary ([place])   . Hypertension   . Hypertensive heart disease with end stage renal disease on dialysis ([place]) 03/13/2017  . Subacute osteomyelitis, right ankle and foot ([place])   . SVT (supraventricular tachycardia) ([place])    ? afib or atrial flutter s/p TEE/DCCV with subsequent ablation due to reoccurrence in [place]  . Type 2 diabetes mellitus [facility])     Assessment: 52 yo male with history of afib s/p ablation in 2016 admitted to hospital with CP. He has been in NSR and is rate controlled at home with metoprolol. Patient was tachycardic on admit and later converted to Afib  RVR post-HD session. Patient was placed on a diltiazem gtt. PTA warfarin was discontinued and heparin gtt started. Patient in NSR this morning. MD stopped heparin gtt and has consulted pharmacy to resume warfarin.   INR 1.93 on 5/4. CBC stable, no bleeding reported.   PTA warfarin dose: 7.5 mg TThS, 10 mg MWFSun  Goal of Therapy:  INR 2-3 Monitor platelets by anticoagulation protocol: Yes   Plan:  Warfarin 10 mg po x 1 tonight Daily CBC and INR Monitor for s/sx of bleeding   Leroy Libman, PharmD Pharmacy Resident Pager: [phone]

## 2018-05-03 NOTE — Progress Notes (Signed)
Critical lab value Troponin 0.27 called by lab.

## 2018-05-03 NOTE — Discharge Instructions (Signed)
Atrial Fibrillation Atrial fibrillation is a type of irregular or rapid heartbeat (arrhythmia). In atrial fibrillation, the heart quivers continuously in a chaotic pattern. This occurs when parts of the heart receive disorganized signals that make the heart unable to pump blood normally. This can increase the risk for stroke, heart failure, and other heart-related conditions. There are different types of atrial fibrillation, including:  Paroxysmal atrial fibrillation. This type starts suddenly, and it usually stops on its own shortly after it starts.  Persistent atrial fibrillation. This type often lasts longer than a week. It may stop on its own or with treatment.  Long-lasting persistent atrial fibrillation. This type lasts longer than 12 months.  Permanent atrial fibrillation. This type does not go away.  Talk with your health care provider to learn about the type of atrial fibrillation that you have. What are the causes? This condition is caused by some heart-related conditions or procedures, including:  A heart attack.  Coronary artery disease.  Heart failure.  Heart valve conditions.  High blood pressure.  Inflammation of the sac that surrounds the heart (pericarditis).  Heart surgery.  Certain heart rhythm disorders, such as Wolf-Parkinson-White syndrome.  Other causes include:  Pneumonia.  Obstructive sleep apnea.  Blockage of an artery in the lungs (pulmonary embolism, or PE).  Lung cancer.  Chronic lung disease.  Thyroid problems, especially if the thyroid is overactive (hyperthyroidism).  Caffeine.  Excessive alcohol use or illegal drug use.  Use of some medicines, including certain decongestants and diet pills.  Sometimes, the cause cannot be found. What increases the risk? This condition is more likely to develop in:  People who are older in age.  People who smoke.  People who have diabetes mellitus.  People who are overweight  (obese).  Athletes who exercise vigorously.  What are the signs or symptoms? Symptoms of this condition include:  A feeling that your heart is beating rapidly or irregularly.  A feeling of discomfort or pain in your chest.  Shortness of breath.  Sudden light-headedness or weakness.  Getting tired easily during exercise.  In some cases, there are no symptoms. How is this diagnosed? Your health care provider may be able to detect atrial fibrillation when taking your pulse. If detected, this condition may be diagnosed with:  An electrocardiogram (ECG).  A Holter monitor test that records your heartbeat patterns over a 24-hour period.  Transthoracic echocardiogram (TTE) to evaluate how blood flows through your heart.  Transesophageal echocardiogram (TEE) to view more detailed images of your heart.  A stress test.  Imaging tests, such as a CT scan or chest X-ray.  Blood tests.  How is this treated? The main goals of treatment are to prevent blood clots from forming and to keep your heart beating at a normal rate and rhythm. The type of treatment that you receive depends on many factors, such as your underlying medical conditions and how you feel when you are experiencing atrial fibrillation. This condition may be treated with:  Medicine to slow down the heart rate, bring the heart's rhythm back to normal, or prevent clots from forming.  Electrical cardioversion. This is a procedure that resets your heart's rhythm by delivering a controlled, low-energy shock to the heart through your skin.  Different types of ablation, such as catheter ablation, catheter ablation with pacemaker, or surgical ablation. These procedures destroy the heart tissues that send abnormal signals. When the pacemaker is used, it is placed under your skin to help your heart beat in   a regular rhythm.  Follow these instructions at home:  Take over-the counter and prescription medicines only as told by your  health care provider.  If your health care provider prescribed a blood-thinning medicine (anticoagulant), take it exactly as told. Taking too much blood-thinning medicine can cause bleeding. If you do not take enough blood-thinning medicine, you will not have the protection that you need against stroke and other problems.  Do not use tobacco products, including cigarettes, chewing tobacco, and e-cigarettes. If you need help quitting, ask your health care provider.  If you have obstructive sleep apnea, manage your condition as told by your health care provider.  Do not drink alcohol.  Do not drink beverages that contain caffeine, such as coffee, soda, and tea.  Maintain a healthy weight. Do not use diet pills unless your health care provider approves. Diet pills may make heart problems worse.  Follow diet instructions as told by your health care provider.  Exercise regularly as told by your health care provider.  Keep all follow-up visits as told by your health care provider. This is important. How is this prevented?  Avoid drinking beverages that contain caffeine or alcohol.  Avoid certain medicines, especially medicines that are used for breathing problems.  Avoid certain herbs and herbal medicines, such as those that contain ephedra or ginseng.  Do not use illegal drugs, such as cocaine and amphetamines.  Do not smoke.  Manage your high blood pressure. Contact a health care provider if:  You notice a change in the rate, rhythm, or strength of your heartbeat.  You are taking an anticoagulant and you notice increased bruising.  You tire more easily when you exercise or exert yourself. Get help right away if:  You have chest pain, abdominal pain, sweating, or weakness.  You feel nauseous.  You notice blood in your vomit, bowel movement, or urine.  You have shortness of breath.  You suddenly have swollen feet and ankles.  You feel dizzy.  You have sudden weakness or  numbness of the face, arm, or leg, especially on one side of the body.  You have trouble speaking, trouble understanding, or both (aphasia).  Your face or your eyelid droops on one side. These symptoms may represent a serious problem that is an emergency. Do not wait to see if the symptoms will go away. Get medical help right away. Call your local emergency services (911 in the [place] Do not drive yourself to the hospital. This information is not intended to replace advice given to you by your health care provider. Make sure you discuss any questions you have with your health care provider. Document Released: 12/16/2005 Document Revised: 04/24/2016 Document Reviewed: 04/12/2015 Elsevier Interactive Patient Education  2018 Elsevier Inc.  

## 2018-05-03 NOTE — Progress Notes (Signed)
Internal Medicine Attending  Date: 05/03/2018  Patient name: Thomas [name] Medical record number: [id number] Date of birth: [birth year] Age: 52 y.o. Gender: male  I saw and evaluated the patient. I reviewed the sub-intern's/resident's note by Ms. Florene Glen and Dr. Reesa Chew and I agree with the sun-intern's/resident's findings and plans as documented in their progress note.  Please see my H&P dated 05/03/2018 and attached to Dr. Jacqualine Code H&P dated 05/02/2018 for the specifics of my evaluation, assessment, and plan from earlier in the day.

## 2018-05-03 NOTE — Progress Notes (Signed)
Subjective: Thomas [name] has no complaints this morning and feels that he is in his usual state of health. He denies CP, N/V/D, dizziness, SOB, palpitations, or difficulty lying flat. He does not have any edema or feel that he has an increased fluid status.  He was counseled that his heart may continue to go in and out of rhythm, but that we will continue to try to control his heart rate.  Objective:  Vital signs in last 24 hours: Vitals:   05/02/18 2234 05/03/18 0144 05/03/18 0429 05/03/18 0820  BP: 115/87 113/63 104/84 122/61  Pulse: (!) 143 83 (!) 51 (!) 45  Resp: 20 18 (!) 23 19  Temp: 98.4 F (36.9 C) 98.3 F (36.8 C) 98.4 F (36.9 C) 98.5 F (36.9 C)  TempSrc: Oral Oral Oral Oral  SpO2: 94% 94% 96% 96%  Weight: 273 lb 11.2 oz (124.1 kg)  269 lb 1.6 oz (122.1 kg)   Height: 6\' 6"  (1.981 m)      Bedside telemetry reveals sinus rhythm at a rate of ~95 with PACs.  Physical Exam  Vitals reviewed. Constitutional: He appears well-developed and well-nourished.  HENT:  Head: Normocephalic and atraumatic.  Cardiovascular: Normal heart sounds. An irregular rhythm present.  Respiratory: Effort normal and breath sounds normal.  GI: Soft. Bowel sounds are normal.  Musculoskeletal:  S/p BKAs bilaterally  Skin: Skin is warm and dry.  Psychiatric: He has a normal mood and affect. His behavior is normal.   Lab Results  Component Value Date   WBC 6.5 05/03/2018   HGB 11.2 (L) 05/03/2018   HCT 33.8 (L) 05/03/2018   PLT 212 05/03/2018   GLUCOSE 259 (H) 05/03/2018   CHOL 141 03/10/2017   TRIG 284 (H) 03/10/2017   HDL 22 (L) 03/10/2017   LDLCALC 62 03/10/2017   ALT 14 (L) 01/14/2018   AST 17 01/14/2018   NA 137 05/03/2018   K 3.4 (L) 05/03/2018   CL 98 (L) 05/03/2018   CREATININE 7.82 (H) 05/03/2018   BUN 40 (H) 05/03/2018   CO2 25 05/03/2018   TSH 1.348 05/02/2018   INR 1.93 05/02/2018   HGBA1C 7.7 (H) 09/24/2017   Lab Results  Component Value Date   TROPONINI 0.36 (HH)  05/03/2018   Troponin (Point of Care Test) Recent Labs    05/02/18 1806  TROPIPOC 0.25*   Lab Results  Component Value Date   HGBA1C 7.7 (H) 09/24/2017    Assessment/Plan:  Principal Problem:   Atrial fibrillation with RVR (HCC) Active Problems:   Hypertensive heart disease with end stage renal disease on dialysis (HCC)   Type 2 diabetes mellitus with complication (HCC)   HIV disease ([place])   Non-ischemic cardiomyopathy ([place])   S/P bilateral BKA (below knee amputation) ([place])   Anemia of chronic disease   Morbid obesity (HCC)  Thomas [name] is a 52 yo male with history of ESRD on HD TThS, HFrEF with EF 35% 2/2 NICM, HTN, well controlled HIV, T2DM, bilateral BKAs, and history of SVT s/p TEE and DCCV 2016 who presents to the ED from outpatient HD with tachycardia and found to be in Afib with RVR.   # Afib with RVR: Patient has a history of Afib s/p ablation in 2016. He has had no episodes since then. On metoprolol 25 mg at home, with reported history of failure on several rate control agents. He presents today in Afib with RVR after HD session. Rate well controlled on diltiazem gtt, switch to oral  rate control agent in anticipation of discharge. Troponins still have not peaked yet, though they may persist as it is renally cleared.   - discontinue diltiazem gtt, increase home metoprolol to 50mg  BID  - Resume home Coumadin, discontinue heparin gtt  - Troponin levels: 0.27, 0.33, 0.36. Next check 5/5 1230 -TSH WNL   # HTN: on lisinopril, amlodipine, and metoprolol at home. Normotensive at this time. - Holding amlodipine & lisinopril due to yesterday's hypotension; begin metoprolol 50mg  BID  # HFrEF 2/2 NICM: EF 35% in 04/2017. Appears euvolemic on exam. Does not follow up with cardiology or HF Clinic. - Daily weights. No I/Os as patient is anuric.   # ESRD: HD TThSat, s/p recent AVF infiltration. Dialyzes on TThS schedule at [facility]. Has not been reaching his EDW  recently. On 5/4, he completed his full HD, but left approximately 4kg above his EDW. Denies CP, dyspnea, N/V, fever at this time. - Clinically euvolemic, no s/sx of volume overload - Nephrology recommending HD Monday d/t not meeting EDW; to be given in OP clinic, if necessary - Continue home sevelamer  # HIV, well controlled: HIV undetectable and CD4 610. - Continue home [place]   # T2DM: Insulin dependent. A1c 7.7 08/2017. On Lantus 30 units at home. Blood glucose 269 on admission. - Lantus 20U + SSI-S  - CBG monitoring q4h; last levels 250 @ 0431 and 184 @ 0814  # GERD: Continue home Protonix 80 mg QD   # Bilateral BKAs: most recent one was R BKA on 12/06/2017. Continue home Silvadene cream and [place].  # Constipation: Continue home Fibercon and Senokot.  F: KVO E: monitoring  N: Carb modified diet  VTE ppx: AC on coumadin   Code status: Full code, not confirmed on admission   Dispo: Anticipated discharge in approximately 0 day(s).   Laureen Ochs, Medical Student 05/03/2018, 9:55 AM Pager: [phone]  Attestation for Student Documentation:  I personally was present and performed or re-performed the history, physical exam and medical decision-making activities of this service and have verified that the service and findings are accurately documented in the student's note.  Lorella Nimrod, MD 05/03/2018, 11:58 AM

## 2018-05-03 NOTE — Progress Notes (Signed)
ANTICOAGULATION CONSULT NOTE - Initial Consult  Pharmacy Consult for heparin Indication: chest pain/ACS  Allergies  Allergen Reactions  . Oxycodone-Acetaminophen     Hallucinations/delirum    Patient Measurements: Height: 6\' 6"  (198.1 cm) Weight: 273 lb 11.2 oz (124.1 kg) IBW/kg (Calculated) : 91.4 Heparin Dosing Weight: 117.2 kg  Vital Signs: Temp: 98.4 F (36.9 C) (05/04 2234) Temp Source: Oral (05/04 2234) BP: 115/87 (05/04 2234) Pulse Rate: 143 (05/04 2234)  Labs: Recent Labs    05/02/18 1800 05/02/18 1833 05/02/18 2302  HGB 13.0 13.3  --   HCT 38.0* 39.0  --   PLT 232  --   --   LABPROT  --   --  21.9*  INR  --   --  1.93  CREATININE 6.22* 6.20*  --   TROPONINI  --   --  0.27*    Estimated Creatinine Clearance: 20.8 mL/min (A) (by C-G formula based on SCr of 6.2 mg/dL (H)).   Medical History: Past Medical History:  Diagnosis Date  . Anemia   . Chronic combined systolic and diastolic CHF (congestive heart failure) ([place])   . Diabetes ([place])   . Diabetic foot ulcer ([place]) 09/03/2017  . Diabetic wet gangrene of the foot ([place]) 09/04/2017  . ESRD (end stage renal disease) [facility])    [facility] [address] T, Th, Sat  . HIV disease ([place])   . Hyperparathyroidism, secondary ([place])   . Hypertension   . Hypertensive heart disease with end stage renal disease on dialysis ([place]) 03/13/2017  . Subacute osteomyelitis, right ankle and foot ([place])   . SVT (supraventricular tachycardia) ([place])    ? afib or atrial flutter s/p TEE/DCCV with subsequent ablation due to reoccurrence in [place]  . Type 2 diabetes mellitus (HCC)     Medications:  Medications Prior to Admission  Medication Sig Dispense Refill Last Dose  . bictegravir-emtricitabine-tenofovir AF (BIKTARVY) 50-200-25 MG TABS tablet Take 1 tablet by mouth daily. 30 tablet 11 05/02/2018 at Unknown time  . metoprolol tartrate (LOPRESSOR) 25 MG tablet Take 1 tablet (25 mg total) by mouth 2 (two) times daily. 60 tablet 1 05/02/2018 at  0800  . amLODipine (NORVASC) 5 MG tablet Take 1 tablet (5 mg total) by mouth daily. (Patient not taking: Reported on 05/02/2018) 30 tablet 0 Not Taking at Unknown time  . cadexomer iodine (IODOSORB) 0.9 % gel Apply 1 application topically daily as needed for wound care. 40 g 0 Taking  . camphor-menthol (SARNA) lotion Apply topically as needed for itching. 222 mL 0 Taking  . doxycycline (VIBRA-TABS) 100 MG tablet Take 1 tablet (100 mg total) by mouth daily. 30 tablet 0 Taking  . ferric gluconate 125 mg in sodium chloride 0.9 % 100 mL Inject 125 mg into the vein Every Tuesday,Thursday,and Saturday with dialysis.   Taking  . gabapentin (NEURONTIN) 100 MG capsule Take 1 capsule (100 mg total) by mouth every dialysis. 15 capsule 1 Taking  . insulin glargine (LANTUS) 100 unit/mL SOPN Inject 0.3 mLs (30 Units total) into the skin at bedtime. 15 mL 11 Taking  . lipase/protease/amylase (CREON) 12000 units CPEP capsule Take 1 capsule (12,000 Units total) by mouth 3 (three) times daily before meals. 270 capsule 0 Taking  . lisinopril (PRINIVIL,ZESTRIL) 40 MG tablet Take 1 tablet (40 mg total) by mouth daily. 30 tablet 0 Taking  . multivitamin (RENA-VIT) TABS tablet Take 1 tablet by mouth at bedtime. 30 tablet 0 Taking  . naproxen sodium (ALEVE) 220 MG tablet Take  440 mg by mouth as needed (pain).   Taking  . pantoprazole (PROTONIX) 40 MG tablet Take 2 tablets (80 mg total) by mouth daily. 60 tablet 1 Taking  . pentoxifylline (TRENTAL) 400 MG CR tablet Take 1 tablet (400 mg total) by mouth daily. 30 tablet 3 Taking  . polycarbophil (FIBERCON) 625 MG tablet Take 1 tablet (625 mg total) by mouth daily. 30 tablet 0 Taking  . sevelamer carbonate (RENVELA) 800 MG tablet Take 800 mg by mouth daily.   Taking  . silver sulfADIAZINE (SILVADENE) 1 % cream Apply 1 application topically daily. 50 g 0 Taking  . silver sulfADIAZINE (SILVADENE) 1 % cream Apply 1 application topically daily. Apply to affected area daily plus dry  dressing 400 g 3 Taking  . warfarin (COUMADIN) 10 MG tablet Take 1 tablet (10 mg total) by mouth See admin instructions. Take 10 mg by mouth daily Sunday, Monday, Wednesday and Friday 30 tablet 0 Taking  . warfarin (COUMADIN) 7.5 MG tablet Take 1 tablet (7.5 mg total) by mouth See admin instructions. Take 7.5 mg by mouth daily on Tuesday, Thursday ans Saturday 30 tablet 0 Taking    Assessment: 52 yo man to start heparin for CP.  He was on coumadin PTA.  INR 1.93. Goal of Therapy:  Heparin level 0.3-0.7 units/ml Monitor platelets by anticoagulation protocol: Yes   Plan:  Heparin drip at 1600 units/hr Check heparin level 8 hours after start CBC and heparin level daily while on heparin Monitor for bleeding complications     Geran Haithcock [place] 05/03/2018,12:39 AM

## 2018-05-03 NOTE — Consult Note (Deleted)
[facility] Renal Consultation Note    Indication for Consultation:  Management of ESRD/hemodialysis, anemia, hypertension/volume, and secondary hyperparathyroidism. PCP:  HPI: Thomas [name] is a 52 y.o. male with ESRD on HD, Type 2 DM, HIV, NICM (EF 35%), Hx B BKAs d/t diabetic infections/PAD, and Hx A-fib/flutter v. SVT in past with Hx DCCV and ablation (on warfarin).  Pt reported some mild CP yesterday morning which resolved. Presented to HD per usual schedule. During HD, developed tachycardia with HR 150-160 range. He was mostly asymptomatic at that time. Completed HD without issues, removed 3.6L fluid, but HR remained high so we was brought to ED for evaluation. In the ED, initially found to be in SVT. IV metoprolol  x2 and adenosine x 2 given and he converted to A-fib with RVR. Started on diltiazem drip. Labs showed Trop 0.25, K 3.3 (post-HD), Htgb 13, WBC 7.9, BS 230.  Dialyzes on TTS schedule at [facility]. Has not been reaching his EDW recently. On 5/4, he completed his full HD, but left approximately 4kg above his EDW. Denies CP, dyspnea, N/V, fever at this time. S/p recent R AVF infiltration, but now doing ok.    Past Medical History:  Diagnosis Date  . Anemia   . Chronic combined systolic and diastolic CHF (congestive heart failure) ([place])   . Diabetes ([place])   . Diabetic foot ulcer ([place]) 09/03/2017  . Diabetic wet gangrene of the foot ([place]) 09/04/2017  . ESRD (end stage renal disease) [facility])    [facility] [address] T, Th, Sat  . HIV disease ([place])   . Hyperparathyroidism, secondary ([place])   . Hypertension   . Hypertensive heart disease with end stage renal disease on dialysis ([place]) 03/13/2017  . Subacute osteomyelitis, right ankle and foot ([place])   . SVT (supraventricular tachycardia) ([place])    ? afib or atrial flutter s/p TEE/DCCV with subsequent ablation due to reoccurrence in [place]  . Type 2 diabetes mellitus ([place])    Past Surgical History:  Procedure  Laterality Date  . AMPUTATION Left    foot  . AMPUTATION Right 12/06/2017   Procedure: AMPUTATION BELOW KNEE;  Surgeon: [name], MD;  Location: [place];  Service: Orthopedics;  Laterality: Right;  . APPLICATION OF WOUND VAC Right 09/04/2017   Procedure: APPLICATION OF WOUND VAC;  Surgeon: [name], DPM;  Location: [place];  Service: Podiatry;  Laterality: Right;  . APPLICATION OF WOUND VAC  12/06/2017   Procedure: APPLICATION OF WOUND VAC;  Surgeon: [name], MD;  Location: [place];  Service: Orthopedics;;  . COLONOSCOPY WITH PROPOFOL N/A 07/30/2017   Procedure: COLONOSCOPY WITH PROPOFOL;  Surgeon: [name], MD;  Location: [place];  Service: Endoscopy;  Laterality: N/A;  . ESOPHAGOGASTRODUODENOSCOPY (EGD) WITH PROPOFOL N/A 07/28/2017   Procedure: ESOPHAGOGASTRODUODENOSCOPY (EGD) WITH PROPOFOL;  Surgeon: [name], MD;  Location: [place];  Service: Endoscopy;  Laterality: N/A;  . ESOPHAGOGASTRODUODENOSCOPY (EGD) WITH PROPOFOL N/A 01/15/2018   Procedure: ESOPHAGOGASTRODUODENOSCOPY (EGD) WITH PROPOFOL;  Surgeon: [name], MD;  Location[facility] ENDOSCOPY;  Service: Endoscopy;  Laterality: N/A;  . FLEXIBLE SIGMOIDOSCOPY N/A 07/27/2017   Procedure: FLEXIBLE SIGMOIDOSCOPY;  Surgeon: [name], MD;  Location: [facility] ENDOSCOPY;  Service: Endoscopy;  Laterality: N/A;  . GRAFT APPLICATION Right 64/33/2951   Procedure: SKIN GRAFT APPLICATION RIGHT FOOT;  Surgeon: [name], DPM;  Location: [place];  Service: Podiatry;  Laterality: Right;  . I&D EXTREMITY Right 09/04/2017   Procedure: IRRIGATION AND DEBRIDEMENT  EXTREMITY;  Surgeon: [name], DPM;  Location: [place];  Service: Podiatry;  Laterality: Right;  . I&D EXTREMITY Right 11/12/2017   Procedure: IRRIGATION AND DEBRIDEMENT ULCER RIGHT FOOT;  Surgeon: [name], DPM;  Location: [place];  Service: Podiatry;  Laterality: Right;  . RIGHT AV FISTULA PLACEMENT  10/19/2012  . WOUND DEBRIDEMENT N/A 09/24/2017    Procedure: DEBRIDEMENT WOUND;  Surgeon: [name], DPM;  Location: [place];  Service: Podiatry;  Laterality: N/A;   Family History  Problem Relation Age of Onset  . Heart disease Mother   . Diabetes Mother   . Heart disease Father   . Cancer Neg Hx    Social History:  reports that he has never smoked. He has never used smokeless tobacco. He reports that he does not drink alcohol or use drugs.  ROS: As per HPI otherwise negative.  Physical Exam: Vitals:   05/02/18 2234 05/03/18 0144 05/03/18 0429 05/03/18 0820  BP: 115/87 113/63 104/84 122/61  Pulse: (!) 143 83 (!) 51 (!) 45  Resp: 20 18 (!) 23 19  Temp: 98.4 F (36.9 C) 98.3 F (36.8 C) 98.4 F (36.9 C) 98.5 F (36.9 C)  TempSrc: Oral Oral Oral Oral  SpO2: 94% 94% 96% 96%  Weight: 124.1 kg (273 lb 11.2 oz)  122.1 kg (269 lb 1.6 oz)   Height: 6\' 6"  (1.981 m)        General: Well developed, well nourished, in no acute distress. Head: Normocephalic, atraumatic, sclera non-icteric, mucus membranes are moist. Neck: Supple without lymphadenopathy/masses.  Lungs: Clear bilaterally to auscultation without wheezes, rales, or rhonchi. Breathing is unlabored. Heart: Irregularly irregular, 2/6 systolic murmur Abdomen: Soft, non-tender, non-distended with normoactive bowel sounds. No rebound/guarding. Musculoskeletal:  Strength and tone appear normal for age. Lower extremities: B BKA without stump edema. Neuro: Alert and oriented X 3. Moves all extremities spontaneously. Psych:  Responds to questions appropriately with a normal affect. Dialysis Access: R forearm AVF + thrill, no obvious infiltration at this time.  Allergies  Allergen Reactions  . Oxycodone-Acetaminophen     Hallucinations/delirum   Prior to Admission medications   Medication Sig Start Date End Date Taking? Authorizing Provider  bictegravir-emtricitabine-tenofovir AF (BIKTARVY) 50-200-25 MG TABS tablet Take 1 tablet by mouth daily. 03/02/18  Yes Carlyle Basques,  MD  metoprolol tartrate (LOPRESSOR) 25 MG tablet Take 1 tablet (25 mg total) by mouth 2 (two) times daily. 03/31/18  Yes Bartholomew Crews, MD  amLODipine (NORVASC) 5 MG tablet Take 1 tablet (5 mg total) by mouth daily. Patient not taking: Reported on 05/02/2018 12/19/17   Angiulli, Lavon Paganini, PA-C  cadexomer iodine (IODOSORB) 0.9 % gel Apply 1 application topically daily as needed for wound care. 03/02/18   [name], MD  camphor-menthol [facility]) lotion Apply topically as needed for itching. 12/11/17   Colbert Ewing, MD  doxycycline (VIBRA-TABS) 100 MG tablet Take 1 tablet (100 mg total) by mouth daily. 02/16/18   [name], MD  ferric gluconate 125 mg in sodium chloride 0.9 % 100 mL Inject 125 mg into the vein Every Tuesday,Thursday,and Saturday with dialysis. 05/10/17   Hosie Poisson, MD  gabapentin (NEURONTIN) 100 MG capsule Take 1 capsule (100 mg total) by mouth every dialysis. 01/09/18   Jamse Arn, MD  insulin glargine (LANTUS) 100 unit/mL SOPN Inject 0.3 mLs (30 Units total) into the skin at bedtime. 12/19/17   Angiulli, Lavon Paganini, PA-C  lipase/protease/amylase (CREON) 12000 units CPEP capsule Take  1 capsule (12,000 Units total) by mouth 3 (three) times daily before meals. 12/19/17   Angiulli, Lavon Paganini, PA-C  lisinopril (PRINIVIL,ZESTRIL) 40 MG tablet Take 1 tablet (40 mg total) by mouth daily. 12/19/17   Angiulli, Lavon Paganini, PA-C  multivitamin (RENA-VIT) TABS tablet Take 1 tablet by mouth at bedtime. 12/19/17   Angiulli, Lavon Paganini, PA-C  naproxen sodium (ALEVE) 220 MG tablet Take 440 mg by mouth as needed (pain).    [provider]  pantoprazole (PROTONIX) 40 MG tablet Take 2 tablets (80 mg total) by mouth daily. 02/16/18   Alphonzo Grieve, MD  pentoxifylline (TRENTAL) 400 MG CR tablet Take 1 tablet (400 mg total) by mouth daily. 03/02/18   [name], MD  polycarbophil (FIBERCON) 625 MG tablet Take 1 tablet (625 mg total) by mouth daily. 12/19/17   Angiulli, Lavon Paganini, PA-C   sevelamer carbonate (RENVELA) 800 MG tablet Take 800 mg by mouth daily. 01/10/18   [provider]  silver sulfADIAZINE (SILVADENE) 1 % cream Apply 1 application topically daily. 01/02/18   Suzan Slick, NP  silver sulfADIAZINE (SILVADENE) 1 % cream Apply 1 application topically daily. Apply to affected area daily plus dry dressing 03/02/18   [name], MD  warfarin (COUMADIN) 10 MG tablet Take 1 tablet (10 mg total) by mouth See admin instructions. Take 10 mg by mouth daily Sunday, Monday, Wednesday and Friday 01/23/18   [name], MD  warfarin (COUMADIN) 7.5 MG tablet Take 1 tablet (7.5 mg total) by mouth See admin instructions. Take 7.5 mg by mouth daily on Tuesday, Thursday ans Saturday 01/24/18   [name], MD   Current Facility-Administered Medications  Medication Dose Route Frequency Provider Last Rate Last Dose  . 0.9 %  sodium chloride infusion  250 mL Intravenous PRN Welford Roche, MD      . acetaminophen (TYLENOL) tablet 650 mg  650 mg Oral Q6H PRN Welford Roche, MD       Or  . acetaminophen (TYLENOL) suppository 650 mg  650 mg Rectal Q6H PRN Welford Roche, MD      . bictegravir-emtricitabine-tenofovir AF (BIKTARVY) 50-200-25 MG per tablet 1 tablet  1 tablet Oral Daily Santos-Sanchez, Idalys, MD      . camphor-menthol (SARNA) lotion   Topical PRN Welford Roche, MD      . diltiazem (CARDIZEM) 100 mg in dextrose 5% 120mL (1 mg/mL) infusion  5-15 mg/hr Intravenous Continuous Santos-Sanchez, Idalys, MD 7.5 mL/hr at 05/03/18 0551 7.5 mg/hr at 05/03/18 0551  . heparin ADULT infusion 100 units/mL (25000 units/255mL sodium chloride 0.45%)  1,600 Units/hr Intravenous Continuous Oval Linsey, MD 16 mL/hr at 05/03/18 0551 1,600 Units/hr at 05/03/18 0551  . insulin aspart (novoLOG) injection 0-9 Units  0-9 Units Subcutaneous TID WC Santos-Sanchez, Idalys, MD      . insulin glargine (LANTUS) injection 20 Units  20 Units Subcutaneous QHS  Welford Roche, MD   20 Units at 05/02/18 2314  . lipase/protease/amylase (CREON) capsule 12,000 Units  12,000 Units Oral TID AC Santos-Sanchez, Idalys, MD      . pantoprazole (PROTONIX) EC tablet 80 mg  80 mg Oral Daily Santos-Sanchez, Idalys, MD      . pentoxifylline (TRENTAL) CR tablet 400 mg  400 mg Oral Daily Santos-Sanchez, Idalys, MD      . polycarbophil (FIBERCON) tablet 625 mg  625 mg Oral Daily Santos-Sanchez, Idalys, MD      . senna-docusate (Senokot-S) tablet 1 tablet  1 tablet Oral QHS PRN Welford Roche, MD      .  sevelamer carbonate (RENVELA) tablet 800 mg  800 mg Oral Q breakfast Welford Roche, MD      . silver sulfADIAZINE (SILVADENE) 1 % cream 1 application  1 application Topical Daily Santos-Sanchez, Idalys, MD      . sodium chloride flush (NS) 0.9 % injection 3 mL  3 mL Intravenous Q12H Santos-Sanchez, Idalys, MD      . sodium chloride flush (NS) 0.9 % injection 3 mL  3 mL Intravenous Q12H Santos-Sanchez, Idalys, MD      . sodium chloride flush (NS) 0.9 % injection 3 mL  3 mL Intravenous PRN Welford Roche, MD       Labs: Basic Metabolic Panel: Recent Labs  Lab 05/02/18 1800 05/02/18 1833 05/03/18 0423  NA 140 138 137  K 3.3* 3.5 3.4*  CL 97* 98* 98*  CO2 28  --  25  GLUCOSE 230* 249* 259*  BUN 29* 34* 40*  CREATININE 6.22* 6.20* 7.82*  CALCIUM 8.8*  --  8.4*   CBC: Recent Labs  Lab 05/02/18 1800 05/02/18 1833 05/03/18 0423  WBC 7.9  --  6.5  HGB 13.0 13.3 11.2*  HCT 38.0* 39.0 33.8*  MCV 98.4  --  98.5  PLT 232  --  212   Cardiac Enzymes: Recent Labs  Lab 05/02/18 2302 05/03/18 0423  TROPONINI 0.27* 0.33*   CBG: Recent Labs  Lab 05/02/18 2115 05/03/18 0431 05/03/18 0814  GLUCAP 269* 250* 184*   Studies/Results: Dg Chest Port 1 View  Result Date: 05/02/2018 CLINICAL DATA:  Chest pain and tachycardia. EXAM: PORTABLE CHEST 1 VIEW COMPARISON:  Chest x-ray dated January 13, 2018. FINDINGS: The heart size and  mediastinal contours are within normal limits. Both lungs are clear. The visualized skeletal structures are unremarkable. IMPRESSION: No active disease. Electronically Signed   By: Titus Dubin M.D.   On: 05/02/2018 18:34   Dialysis Orders:  TTS at [facility] 4:15hr, 500/800, 116kg, 3K/2.25Ca, AVF, no heparin (on warfarin) - Hectoral 25mcg IV q HD - No venofer or ESA  Assessment/Plan: 1.  A-fib with RVR: On diltiazem drip and heparin. Trending cardiac enzymes. Per primary. 2.  ESRD: Usual TTS schedule. Not reaching EDW, for extra HD tomorrow for fluid removal. Getting down to EDW may help with tachycardia as well. 3.  Hypertension/volume: BP ok, volume up per weights. Extra HD tomorrow. 4.  Anemia: Hgb 11.2. No ESA for now. 5.  Metabolic bone disease: Ca ok. Continue binders/hectoral for now. 6.  Type 2 DM: Per primary. 7.  NICM (EF 35%) 8.  HIV  Veneta Penton, Hershal Coria 05/03/2018, 9:12 AM  [facility] Pager: [phone]  Pt seen, examined and agree w A/P as above.  [name] MD [employer] pager [phone]   05/03/2018, 2:22 PM

## 2018-05-04 ENCOUNTER — Encounter (INDEPENDENT_AMBULATORY_CARE_PROVIDER_SITE_OTHER): Payer: Self-pay | Admitting: Orthopedic Surgery

## 2018-05-04 ENCOUNTER — Ambulatory Visit (INDEPENDENT_AMBULATORY_CARE_PROVIDER_SITE_OTHER): Payer: Medicare Other | Admitting: Orthopedic Surgery

## 2018-05-04 DIAGNOSIS — Z89511 Acquired absence of right leg below knee: Secondary | ICD-10-CM

## 2018-05-04 DIAGNOSIS — IMO0002 Reserved for concepts with insufficient information to code with codable children: Secondary | ICD-10-CM

## 2018-05-04 NOTE — Progress Notes (Signed)
Office Visit Note   Patient: Thomas [name]           Date of Birth: 1966/09/14           MRN: [id number] Visit Date: 05/04/2018              Requested by: No referring provider defined for this encounter. PCP: [name], MD  Chief Complaint  Patient presents with  . Right Leg - Follow-up, Routine Post Op      HPI: Patient is 5 months status post right transtibial amputation he states he is doing well.  Assessment & Plan: Visit Diagnoses:  1. Below knee amputation status, right ([place])     Plan: Recommended scar massage continue wearing the stump shrinker against the skin.  The prosthesis fits well.  Follow-Up Instructions: Return if symptoms worsen or fail to improve.   Ortho Exam  Patient is alert, oriented, no adenopathy, well-dressed, normal affect, normal respiratory effort. Examination patient ambulates with a walker he ambulates well.  The residual limb has completely healed he has some callus skin around the incision and recommended scar massage.  His skin is doing well with the black stump shrinker against the skin with the silicone liner on top.  With his leg in place I cannot pull his leg off.  Imaging: No results found. No images are attached to the encounter.  Labs: Lab Results  Component Value Date   HGBA1C 7.7 (H) 09/24/2017   HGBA1C 11.7 (H) 05/05/2017   ESRSEDRATE 81 (H) 09/03/2017   CRP 4.6 (H) 09/03/2017   REPTSTATUS 12/10/2017 FINAL 12/05/2017   CULT NO GROWTH 5 DAYS 12/05/2017    Lab Results  Component Value Date/Time   HGBA1C 7.7 (H) 09/24/2017 08:08 AM   HGBA1C 11.7 (H) 05/05/2017 11:31 PM    There is no height or weight on file to calculate BMI.  Orders:  No orders of the defined types were placed in this encounter.  No orders of the defined types were placed in this encounter.    Procedures: No procedures performed  Clinical Data: No additional findings.  ROS:  All other systems negative, except as noted in the  HPI. Review of Systems  Objective: Vital Signs: There were no vitals taken for this visit.  Specialty Comments:  No specialty comments available.  PMFS History: Patient Active Problem List   Diagnosis Date Noted  . Chronic systolic heart failure ([place])   . Demand ischemia ([place])   . Atrial fibrillation with RVR ([place]) 05/02/2018  . Murmur, cardiac 02/13/2018  . Mallory-Weiss tear 01/16/2018  . Morbid obesity ([place])   . Fall   . Subtherapeutic international normalized ratio (INR)   . History of left below knee amputation ([place]) 12/11/2017  . History of supraventricular tachycardia   . Chronic diastolic congestive heart failure ([place])   . S/P bilateral BKA (below knee amputation) ([place])   . Anemia of chronic disease   . Leukocytosis   . Non-ischemic cardiomyopathy ([place])   . Hypokalemia 09/04/2017  . Essential hypertension 09/03/2017  . GERD (gastroesophageal reflux disease) 09/03/2017  . GIB (gastrointestinal bleeding) 07/26/2017  . Anemia due to end stage renal disease ([place]) 07/26/2017  . PAF (paroxysmal atrial fibrillation) ([place]) 07/26/2017  . Symptomatic anemia 07/26/2017  . End stage renal disease ([place]) 05/05/2017  . HIV disease ([place]) 03/25/2017  . Hypertensive heart disease with end stage renal disease on dialysis ([place]) 03/13/2017  . ESRD on dialysis ([place]) 03/13/2017  . Type 2 diabetes mellitus with  complication ([place]) 53/61/4431  . SVT (supraventricular tachycardia) ([facility])    Past Medical History:  Diagnosis Date  . Anemia   . Chronic combined systolic and diastolic CHF (congestive heart failure) ([place])   . Diabetes ([place])   . Diabetic foot ulcer ([place]) 09/03/2017  . Diabetic wet gangrene of the foot ([place]) 09/04/2017  . ESRD (end stage renal disease) [facility])    [facility] [address] T, Th, Sat  . HIV disease ([place])   . Hyperparathyroidism, secondary ([place])   . Hypertension   . Hypertensive heart disease with end stage renal disease on dialysis ([place]) 03/13/2017  . Subacute  osteomyelitis, right ankle and foot ([place])   . SVT (supraventricular tachycardia) ([place])    ? afib or atrial flutter s/p TEE/DCCV with subsequent ablation due to reoccurrence in [place]  . Type 2 diabetes mellitus ([facility])     Family History  Problem Relation Age of Onset  . Heart disease Mother   . Diabetes Mother   . Heart disease Father   . Cancer Neg Hx     Past Surgical History:  Procedure Laterality Date  . AMPUTATION Left    foot  . AMPUTATION Right 12/06/2017   Procedure: AMPUTATION BELOW KNEE;  Surgeon: [name], MD;  Location: [facility];  Service: Orthopedics;  Laterality: Right;  . APPLICATION OF WOUND VAC Right 09/04/2017   Procedure: APPLICATION OF WOUND VAC;  Surgeon: [name], DPM;  Location: [facility];  Service: Podiatry;  Laterality: Right;  . APPLICATION OF WOUND VAC  12/06/2017   Procedure: APPLICATION OF WOUND VAC;  Surgeon: [name], MD;  Location: [facility];  Service: Orthopedics;;  . COLONOSCOPY WITH PROPOFOL N/A 07/30/2017   Procedure: COLONOSCOPY WITH PROPOFOL;  Surgeon: [name], MD;  Location: [facility];  Service: Endoscopy;  Laterality: N/A;  . ESOPHAGOGASTRODUODENOSCOPY (EGD) WITH PROPOFOL N/A 07/28/2017   Procedure: ESOPHAGOGASTRODUODENOSCOPY (EGD) WITH PROPOFOL;  Surgeon: [name], MD;  Location: [facility];  Service: Endoscopy;  Laterality: N/A;  . ESOPHAGOGASTRODUODENOSCOPY (EGD) WITH PROPOFOL N/A 01/15/2018   Procedure: ESOPHAGOGASTRODUODENOSCOPY (EGD) WITH PROPOFOL;  Surgeon: [name], MD;  Location[facility] END[facility]COPY;  Service: Endoscopy;  Laterality: N/A;  . FLEXIBLE SIGMOIDOSCOPY N/A 07/27/2017   Procedure: FLEXIBLE SIGMOIDOSCOPY;  Surgeon: [name], MD;  Location: [facility] ENDOSCOPY;  Service: Endoscopy;  Laterality: N/A;  . GRAFT APPLICATION Right 54/00/8676   Procedure: SKIN GRAFT APPLICATION RIGHT FOOT;  Surgeon: [name], DPM;  Location: [facility];  Service: Podiatry;  Laterality: Right;  . I&D EXTREMITY Right 09/04/2017    Procedure: IRRIGATION AND DEBRIDEMENT EXTREMITY;  Surgeon: [name], DPM;  Location: [facility];  Service: Podiatry;  Laterality: Right;  . I&D EXTREMITY Right 11/12/2017   Procedure: IRRIGATION AND DEBRIDEMENT ULCER RIGHT FOOT;  Surgeon: [name], DPM;  Location: [facility];  Service: Podiatry;  Laterality: Right;  . RIGHT AV FISTULA PLACEMENT  10/19/2012  . WOUND DEBRIDEMENT N/A 09/24/2017   Procedure: DEBRIDEMENT WOUND;  Surgeon: [name], DPM;  Location: [facility];  Service: Podiatry;  Laterality: N/A;   Social History   Occupational History  . Occupation: disabled  Tobacco Use  . Smoking status: Never Smoker  . Smokeless tobacco: Never Used  Substance and Sexual Activity  . Alcohol use: No  . Drug use: No  . Sexual activity: Not on file

## 2018-05-18 ENCOUNTER — Other Ambulatory Visit: Payer: Self-pay

## 2018-05-18 ENCOUNTER — Ambulatory Visit: Payer: Medicare Other | Attending: Orthopedic Surgery | Admitting: Physical Therapy

## 2018-05-18 ENCOUNTER — Encounter: Payer: Self-pay | Admitting: Physical Therapy

## 2018-05-18 ENCOUNTER — Encounter: Payer: Medicare Other | Admitting: Internal Medicine

## 2018-05-18 DIAGNOSIS — M25662 Stiffness of left knee, not elsewhere classified: Secondary | ICD-10-CM

## 2018-05-18 DIAGNOSIS — R2681 Unsteadiness on feet: Secondary | ICD-10-CM | POA: Diagnosis not present

## 2018-05-18 DIAGNOSIS — M25672 Stiffness of left ankle, not elsewhere classified: Secondary | ICD-10-CM | POA: Diagnosis present

## 2018-05-18 DIAGNOSIS — Z9181 History of falling: Secondary | ICD-10-CM | POA: Diagnosis present

## 2018-05-18 DIAGNOSIS — M6281 Muscle weakness (generalized): Secondary | ICD-10-CM | POA: Insufficient documentation

## 2018-05-18 DIAGNOSIS — R2689 Other abnormalities of gait and mobility: Secondary | ICD-10-CM | POA: Diagnosis present

## 2018-05-18 DIAGNOSIS — R293 Abnormal posture: Secondary | ICD-10-CM

## 2018-05-18 NOTE — Therapy (Signed)
[facility] [address] [place], [place], [address] Phone: [phone]   Fax:  [phone]  Physical Therapy Evaluation  Patient Details  Name: Thomas [name] MRN: [id number] Date of Birth: 06/14/1966 Referring Provider: Meridee Score, MD   Encounter Date: 05/18/2018  PT End of Session - 05/18/18 1011    Visit Number  1    Number of Visits  26    Date for PT Re-Evaluation  08/14/18    Authorization Type  Medicare & Generic commercial    PT Start Time  0838    PT Stop Time  0935    PT Time Calculation (min)  57 min    Equipment Utilized During Treatment  Gait belt    Activity Tolerance  Patient tolerated treatment well;Patient limited by fatigue    Behavior During Therapy  [facility] for tasks assessed/performed       Past Medical History:  Diagnosis Date  . Anemia   . Chronic combined systolic and diastolic CHF (congestive heart failure) ([place])   . Diabetes ([place])   . Diabetic foot ulcer ([place]) 09/03/2017  . Diabetic wet gangrene of the foot ([place]) 09/04/2017  . ESRD (end stage renal disease) [facility])    [facility] [address] T, Th, Sat  . HIV disease ([place])   . Hyperparathyroidism, secondary ([place])   . Hypertension   . Hypertensive heart disease with end stage renal disease on dialysis ([place]) 03/13/2017  . Subacute osteomyelitis, right ankle and foot ([place])   . SVT (supraventricular tachycardia) ([place])    ? afib or atrial flutter s/p TEE/DCCV with subsequent ablation due to reoccurrence in [place]  . Type 2 diabetes mellitus ([place])     Past Surgical History:  Procedure Laterality Date  . AMPUTATION Left    foot  . AMPUTATION Right 12/06/2017   Procedure: AMPUTATION BELOW KNEE;  Surgeon: [name], MD;  Location: [facility];  Service: Orthopedics;  Laterality: Right;  . APPLICATION OF WOUND VAC Right 09/04/2017   Procedure: APPLICATION OF WOUND VAC;  Surgeon: [name], DPM;  Location: [facility];  Service: Podiatry;  Laterality: Right;   . APPLICATION OF WOUND VAC  12/06/2017   Procedure: APPLICATION OF WOUND VAC;  Surgeon: [name], MD;  Location: [facility];  Service: Orthopedics;;  . COLONOSCOPY WITH PROPOFOL N/A 07/30/2017   Procedure: COLONOSCOPY WITH PROPOFOL;  Surgeon: [name], MD;  Location: [facility];  Service: Endoscopy;  Laterality: N/A;  . ESOPHAGOGASTRODUODENOSCOPY (EGD) WITH PROPOFOL N/A 07/28/2017   Procedure: ESOPHAGOGASTRODUODENOSCOPY (EGD) WITH PROPOFOL;  Surgeon: [name], MD;  Location: [facility];  Service: Endoscopy;  Laterality: N/A;  . ESOPHAGOGASTRODUODENOSCOPY (EGD) WITH PROPOFOL N/A 01/15/2018   Procedure: ESOPHAGOGASTRODUODENOSCOPY (EGD) WITH PROPOFOL;  Surgeon: [name], MD;  Location: [facility] ENDOSCOPY;  Service: Endoscopy;  Laterality: N/A;  . FLEXIBLE SIGMOIDOSCOPY N/A 07/27/2017   Procedure: FLEXIBLE SIGMOIDOSCOPY;  Surgeon: [name], MD;  Location: [facility] ENDOSCOPY;  Service: Endoscopy;  Laterality: N/A;  . GRAFT APPLICATION Right 68/34/1962   Procedure: SKIN GRAFT APPLICATION RIGHT FOOT;  Surgeon: [name], DPM;  Location: [facility];  Service: Podiatry;  Laterality: Right;  . I&D EXTREMITY Right 09/04/2017   Procedure: IRRIGATION AND DEBRIDEMENT EXTREMITY;  Surgeon: [name], DPM;  Location: [facility];  Service: Podiatry;  Laterality: Right;  . I&D EXTREMITY Right 11/12/2017   Procedure: IRRIGATION AND DEBRIDEMENT ULCER RIGHT FOOT;  Surgeon: [name], DPM;  Location: [facility];  Service: Podiatry;  Laterality:  Right;  Marland Kitchen RIGHT AV FISTULA PLACEMENT  10/19/2012  . WOUND DEBRIDEMENT N/A 09/24/2017   Procedure: DEBRIDEMENT WOUND;  Surgeon: [name], DPM;  Location: [facility];  Service: Podiatry;  Laterality: N/A;    There were no vitals filed for this visit.   Subjective Assessment - 05/18/18 0837    Subjective  This 52yo male was referred 04/29/2018 by Meridee Score, MD for Physical Therapy Evaluation with diagnosis of Transtibial Amputation. He underwent a right  Transtibial Amputation on 12/06/2017 due to osteomyelitis. He was hospitalized 12/05/17 transferred to Rehab 12/11/2018 to 12/20/2017. He was readmitted 01/13/2018 & 05/02/2018 with A-Fib with Mallory-Weiss Tear.     Pertinent History  R TTA, ESRD, DM2, HIV, cardiomyopathy, CHF, Tachycardia, A-Fib, Mallory-Weiss Tear, obesity    Limitations  Standing;Walking;House hold activities    Patient Stated Goals  To use bilateral prostheses to dance with wife & swimming,     Currently in Pain?  No/denies         [facility] PT Assessment - 05/18/18 0840      Assessment   Medical Diagnosis  Bilateral Transtibial Amputation    Referring Provider  Meridee Score, MD    Hand Dominance  Left    Prior Therapy  Inpatient Rehab pre-prosthetic      Precautions   Precautions  Fall    Precaution Comments  No BP RUE      Restrictions   Weight Bearing Restrictions  No      Balance Screen   Has the patient fallen in the past 6 months  Yes    How many times?  1 rushing to toilet no injuries    Has the patient had a decrease in activity level because of a fear of falling?   No    Is the patient reluctant to leave their home because of a fear of falling?   No      Home Film/video editor residence    Living Arrangements  Spouse/significant other;Other relatives mother-law, dtr & 72month coming to live with them    Type of [place] entrance;Stairs to enter    Entrance Stairs-Number of Steps  1    Entrance Stairs-Rails  None    Home Layout  Two level;1/2 bath on main level    Alternate Level Stairs-Number of Steps  14    Alternate Level Stairs-Rails  Right    Home Equipment  Walker - 2 wheels;Bedside commode;Cane - single point;Crutches;Tub bench;Hand held shower head;Wheelchair - manual stair lift      Prior Function   Level of Independence  Independent;Independent with household mobility with device;Independent with community mobility with device cane & L  prosthesis full community    Vocation  On disability    Leisure  dancing, swimming, vacation       Posture/Postural Control   Posture/Postural Control  Postural limitations    Postural Limitations  Rounded Shoulders;Forward head;Flexed trunk;Weight shift left      ROM / Strength   AROM / PROM / Strength  PROM;Strength      PROM   Overall PROM   Deficits    Overall PROM Comments  Bil. hip flexors appear tight    PROM Assessment Site  Knee    Right Knee Extension  -12    Left Knee Extension  -10      Strength   Overall Strength  Within functional limits for tasks performed  Overall Strength Comments  muscle endurance low with BLEs fatigue with 5 minutes of standing       Transfers   Transfers  Sit to Stand;Stand to Sit    Sit to Stand  6: Modified independent (Device/Increase time);With upper extremity assist;With armrests;From chair/3-in-1;Other (comment) requires UE touch on RW to stabilize, Bil TTA prostheses    Sit to Stand Details (indicate cue type and reason)  without armrests on chair or stabilizing on RW requires assist    Stand to Sit  6: Modified independent (Device/Increase time);With upper extremity assist;With armrests;To chair/3-in-1;Other (comment) requires UE touch on RW to stabilize, Bil TTA prostheses    Stand to Sit Details  without armrests on chair or stabilizing on RW requires assist      Ambulation/Gait   Ambulation/Gait  Yes    Ambulation/Gait Assistance  5: Supervision;4: Min assist supervision /verbal cues RW & MinA cane    Ambulation/Gait Assistance Details  110' gait with RW resulted in "burning" in chest    Ambulation Distance (Feet)  110 Feet 110' RW & 40' cane    Assistive device  Rolling walker;Straight cane;Prostheses    Gait Pattern  Step-through pattern;Decreased arm swing - right;Decreased step length - left;Decreased stance time - right;Decreased stride length;Antalgic;Trunk flexed;Wide base of support    Ambulation Surface  Indoor;Level     Gait velocity  2.10 ft/sec with RW & bil TTA prostheses      Balance   Balance Assessed  Yes      Static Standing Balance   Static Standing - Balance Support  No upper extremity supported;Right upper extremity supported    Static Standing - Level of Assistance  4: Min assist;6: Modified independent (Device/Increase time) single UE support Mod Indep. & No UE support MinA    Static Standing - Comment/# of Minutes  10 sec without UE support with MinA;  >2 min with single UE support      Dynamic Standing Balance   Dynamic Standing - Balance Support  Right upper extremity supported;During functional activity    Dynamic Standing - Level of Assistance  5: Stand by assistance    Dynamic Standing - Balance Activities  Eyes opn;Head nods;Head turns;Reaching for objects    Dynamic Standing - Comments  reaches 7" anteriorly & to floor, turns to side only with looking over shoulders with RUE support on RW      Prosthetics Assessment - 05/18/18 0840      Prosthetics   Prosthetic Care Dependent with  Skin check;Residual limb care;Prosthetic cleaning;Ply sock cleaning;Correct ply sock adjustment;Proper wear schedule/adjustment;Proper weight-bearing schedule/adjustment;Other (comment) sweat management    Donning prosthesis   Supervision    Doffing prosthesis   Modified independent (Device/Increase time)    Current prosthetic wear tolerance (days/week)   daily since delivery 31 days prior to PT eval    Current prosthetic wear tolerance (#hours/day)   He reports he has built up to ~10-12hrs/day    Current prosthetic weight-bearing tolerance (hours/day)   Patient tolerated 5 minutes of standing & gait without LE limb pain.     Edema  non-pitting edema present in RLE    Residual limb condition   RLE: 2 dry scabs on incision line with moderate adherance of scar, BLEs: dry skin with gray color, normal temperature, minimal to no hair growth.                Objective measurements completed on  examination: See above findings.      [facility] Adult  PT Treatment/Exercise - 05/18/18 0840      Prosthetics   Prosthetic Care Comments   PT instructed in use of Secret [employer] antiperspirant for sweat, increasing wear to include after nightly bath,  consider switching liners with bathing & washing liners at that time.     Education Provided  Skin check;Residual limb care;Prosthetic cleaning;Proper Donning;Proper wear schedule/adjustment;Other (comment) see prosthetic care comments    Person(s) Educated  Patient;Spouse    Education Method  Explanation;Demonstration;Verbal cues    Education Method  Verbalized understanding;Returned demonstration;Tactile cues required;Verbal cues required;Needs further instruction             PT Education - 05/18/18 1011    Education provided  Yes    Education Details  cane designs & where to purchase tall one and quad tip    Person(s) Educated  Patient;Spouse    Methods  Explanation    Comprehension  Verbalized understanding       PT Short Term Goals - 05/18/18 1055      PT SHORT TERM GOAL #1   Title  Patient verbalizes proper limb care for sweat management. (All STGs Target Date: 06/17/2018)    Time  1    Period  Months    Status  New    Target Date  06/17/18      PT SHORT TERM GOAL #2   Title  Patient verbalizes options for pool with & without prostheses to address his goal.     Time  1    Period  Months    Status  New    Target Date  06/17/18      PT SHORT TERM GOAL #3   Title  Patient performs standing balance with cane support reaching 10" & to floor with supervision.     Time  1    Period  Months    Status  New    Target Date  06/17/18      PT SHORT TERM GOAL #4   Title  Patient ambulates 200' with cane & prostheses with supervision.     Time  1    Period  Months    Status  New    Target Date  06/17/18      PT SHORT TERM GOAL #5   Title  Patient negotiates stairs with 2 rails, curbs & ramps with cane & prostheses  with minA.     Time  1    Period  Months    Status  New    Target Date  06/17/18        PT Long Term Goals - 05/18/18 1040      PT LONG TERM GOAL #1   Title  Patient verbalizes & demonstrates proper prosthetic care including prostheses with dialysis to enable safe use of bilateral Transtibial prostheses. (All LTGs Target Date: 08/14/2018)    Time  3    Period  Months    Status  New    Target Date  08/14/18      PT LONG TERM GOAL #2   Title  Patient tolerates wear of bilateral Transtibial prostheses >90% of awake hours without skin issues or limb pain to enable function during most of his day.     Time  3    Period  Months    Status  New    Target Date  08/14/18      PT LONG TERM GOAL #3   Title  Standing balance with cane or less support reaching  10" anteriorly, to floor, scanning & managing clothes modified independent.     Time  3    Period  Months    Status  New    Target Date  08/14/18      PT LONG TERM GOAL #4   Title  Patient ambulates 500' outdoors including grass with cane or less & bilateral prostheses modified independent for community mobility.     Time  3    Period  Months    Status  New    Target Date  08/14/18      PT LONG TERM GOAL #5   Title  Patient ambulates100'  around furniture carrying plate or cup with cane or less & prostheses modified independent for household mobility.     Time  3    Period  Months    Status  New    Target Date  08/14/18      Additional Long Term Goals   Additional Long Term Goals  Yes      PT LONG TERM GOAL #6   Title  Patient negotiates ramps, curbs & stairs (1 rail) with cane or less & prostheses modified independent for community access.     Time  3    Period  Months    Status  New    Target Date  08/14/18             Plan - 05/18/18 1014    Clinical Impression Statement  This 52yo male was functioning at community level with cane & left Transtibial prosthesis then underwent a right Transtibial Amputation  12/06/2018. He received his first right prosthesis & new left prosthesis on 04/17/2018. He requires skilled instruction in proper care for safe use. He is wearing prostheses 10-12 hrs (62-75% of awake hours) which limits function & increases fall risk when out of bed & not wearing prostheses. Balance is impaired requiring sturdy object to touch with standing & sitting and standing ADLs including reaching & scanning. His gait with bilateral transtibial prostheses is walker dependent with gait deviations indicating fall risk. Patient would benefit from skilled care to improve function & safety with bilateral Transtibial prostheses.     History and Personal Factors relevant to plan of care:  R TTA 12/06/17, L TTA 2016, ESRD, DM2, HIV, cardiomyopathy, CHF, Tachycardia, A-Fib, Mallory-Weiss Tear, obesity    Clinical Presentation  Evolving    Clinical Presentation due to:  cardiac issues, high fall risk, dependency in prosthetic care with risk of skin issues (DM2 & ESRD)    Clinical Decision Making  Moderate    Rehab Potential  Good    PT Frequency  2x / week 13 weeks (90 days)    PT Duration  Other (comment)    PT Treatment/Interventions  ADLs/Self Care Home Management;Canalith Repostioning;DME Instruction;Gait training;Stair training;Functional mobility training;Therapeutic activities;Therapeutic exercise;Balance training;Patient/family education;Prosthetic Training;Vestibular    PT Next Visit Plan  6 Minute Walk Test with RW & prostheses with vital signs.  gait with cane, stairs with 2 rails, assess ramps & curbs with RW,     Consulted and Agree with Plan of Care  Patient;Family member/caregiver    Family Member Consulted  wife, Clayton Bosserman       Patient will benefit from skilled therapeutic intervention in order to improve the following deficits and impairments:  Abnormal gait, Cardiopulmonary status limiting activity, Decreased activity tolerance, Decreased balance, Decreased endurance, Decreased mobility,  Decreased knowledge of use of DME, Decreased range of motion, Decreased scar mobility, Decreased  strength, Postural dysfunction, Prosthetic Dependency  Visit Diagnosis: Unsteadiness on feet  Other abnormalities of gait and mobility  History of falling  Stiffness of left ankle, not elsewhere classified  Stiffness of left knee, not elsewhere classified  Abnormal posture  Muscle weakness (generalized)     Problem List Patient Active Problem List   Diagnosis Date Noted  . Chronic systolic heart failure ([place])   . Demand ischemia ([place])   . Atrial fibrillation with RVR ([place]) 05/02/2018  . Murmur, cardiac 02/13/2018  . Mallory-Weiss tear 01/16/2018  . Morbid obesity ([place])   . Fall   . Subtherapeutic international normalized ratio (INR)   . History of left below knee amputation ([place]) 12/11/2017  . History of supraventricular tachycardia   . Chronic diastolic congestive heart failure ([place])   . S/P bilateral BKA (below knee amputation) ([place])   . Anemia of chronic disease   . Leukocytosis   . Non-ischemic cardiomyopathy ([place])   . Hypokalemia 09/04/2017  . Essential hypertension 09/03/2017  . GERD (gastroesophageal reflux disease) 09/03/2017  . GIB (gastrointestinal bleeding) 07/26/2017  . Anemia due to end stage renal disease ([place]) 07/26/2017  . PAF (paroxysmal atrial fibrillation) ([place]) 07/26/2017  . Symptomatic anemia 07/26/2017  . End stage renal disease ([place]) 05/05/2017  . HIV disease ([place]) 03/25/2017  . Hypertensive heart disease with end stage renal disease on dialysis ([place]) 03/13/2017  . ESRD on dialysis ([place]) 03/13/2017  . Type 2 diabetes mellitus with complication ([place]) 19/59/7471  . SVT (supraventricular tachycardia) ([place])     Laritza Vokes PT, DPT 05/18/2018, 11:00 AM  [facility] [address] [place], [place], [address] Phone: [phone]   Fax:  [phone]  Name: Brode Sculley MRN:  [id number] Date of Birth: 08/31/1966

## 2018-05-20 ENCOUNTER — Encounter: Payer: Self-pay | Admitting: Physical Therapy

## 2018-05-20 ENCOUNTER — Ambulatory Visit: Payer: Medicare Other | Admitting: Physical Therapy

## 2018-05-20 DIAGNOSIS — R2689 Other abnormalities of gait and mobility: Secondary | ICD-10-CM

## 2018-05-20 DIAGNOSIS — R2681 Unsteadiness on feet: Secondary | ICD-10-CM | POA: Diagnosis not present

## 2018-05-20 DIAGNOSIS — R293 Abnormal posture: Secondary | ICD-10-CM

## 2018-05-20 DIAGNOSIS — M6281 Muscle weakness (generalized): Secondary | ICD-10-CM

## 2018-05-20 DIAGNOSIS — Z9181 History of falling: Secondary | ICD-10-CM

## 2018-05-20 NOTE — Therapy (Signed)
[facility] [address] [place], [place], [address] Phone: [phone]   Fax:  [phone]  Physical Therapy Treatment  Patient Details  Name: Thomas [name] MRN: [id number] Date of Birth: 1966/08/28 Referring Provider: Meridee Score, MD   Encounter Date: 05/20/2018  PT End of Session - 05/20/18 1503    Visit Number  2    Number of Visits  26    Date for PT Re-Evaluation  08/14/18    Authorization Type  Medicare & Generic commercial    PT Start Time  1408    PT Stop Time  1450    PT Time Calculation (min)  42 min    Activity Tolerance  Patient tolerated treatment well;Patient limited by fatigue    Behavior During Therapy  [facility] for tasks assessed/performed       Past Medical History:  Diagnosis Date  . Anemia   . Chronic combined systolic and diastolic CHF (congestive heart failure) ([place])   . Diabetes ([place])   . Diabetic foot ulcer ([place]) 09/03/2017  . Diabetic wet gangrene of the foot ([place]) 09/04/2017  . ESRD (end stage renal disease) [facility])    [facility] [address] T, Th, Sat  . HIV disease ([place])   . Hyperparathyroidism, secondary ([place])   . Hypertension   . Hypertensive heart disease with end stage renal disease on dialysis ([place]) 03/13/2017  . Subacute osteomyelitis, right ankle and foot ([place])   . SVT (supraventricular tachycardia) ([place])    ? afib or atrial flutter s/p TEE/DCCV with subsequent ablation due to reoccurrence in [place]  . Type 2 diabetes mellitus ([place])     Past Surgical History:  Procedure Laterality Date  . AMPUTATION Left    foot  . AMPUTATION Right 12/06/2017   Procedure: AMPUTATION BELOW KNEE;  Surgeon: [name], MD;  Location: [facility] [place];  Service: Orthopedics;  Laterality: Right;  . APPLICATION OF WOUND VAC Right 09/04/2017   Procedure: APPLICATION OF WOUND VAC;  Surgeon: [name], DPM;  Location: [place] [facility];  Service: Podiatry;  Laterality: Right;  . APPLICATION OF WOUND VAC  12/06/2017    Procedure: APPLICATION OF WOUND VAC;  Surgeon: [name], MD;  Location: [place];  Service: Orthopedics;;  . COLONOSCOPY WITH PROPOFOL N/A 07/30/2017   Procedure: COLONOSCOPY WITH PROPOFOL;  Surgeon: [name], MD;  Location: [facility];  Service: Endoscopy;  Laterality: N/A;  . ESOPHAGOGASTRODUODENOSCOPY (EGD) WITH PROPOFOL N/A 07/28/2017   Procedure: ESOPHAGOGASTRODUODENOSCOPY (EGD) WITH PROPOFOL;  Surgeon: [name], MD;  Location: [place];  Service: Endoscopy;  Laterality: N/A;  . ESOPHAGOGASTRODUODENOSCOPY (EGD) WITH PROPOFOL N/A 01/15/2018   Procedure: ESOPHAGOGASTRODUODENOSCOPY (EGD) WITH PROPOFOL;  Surgeon: [name], MD;  Location: [facility] ENDOSCOPY;  Service: Endoscopy;  Laterality: N/A;  . FLEXIBLE SIGMOIDOSCOPY N/A 07/27/2017   Procedure: FLEXIBLE SIGMOIDOSCOPY;  Surgeon: [name], MD;  Location: [facility] ENDOSCOPY;  Service: Endoscopy;  Laterality: N/A;  . GRAFT APPLICATION Right 19/37/9024   Procedure: SKIN GRAFT APPLICATION RIGHT FOOT;  Surgeon: [name], DPM;  Location: [facility];  Service: Podiatry;  Laterality: Right;  . I&D EXTREMITY Right 09/04/2017   Procedure: IRRIGATION AND DEBRIDEMENT EXTREMITY;  Surgeon: [name], DPM;  Location: [facility];  Service: Podiatry;  Laterality: Right;  . I&D EXTREMITY Right 11/12/2017   Procedure: IRRIGATION AND DEBRIDEMENT ULCER RIGHT FOOT;  Surgeon: [name], DPM;  Location: [facility];  Service: Podiatry;  Laterality: Right;  . RIGHT AV FISTULA PLACEMENT  10/19/2012  .  WOUND DEBRIDEMENT N/A 09/24/2017   Procedure: DEBRIDEMENT WOUND;  Surgeon: [name], DPM;  Location: [facility];  Service: Podiatry;  Laterality: N/A;    There were no vitals filed for this visit.  Subjective Assessment - 05/20/18 1414    Subjective  Reporting some soreness in R lower leg at distal end of limb.  Wearing the prosthesis all day now, reports when he checks his skin there are no persistent red areas, sores or blisters.  Pain  subsides with rest breaks.  Fatigued today due to dialysis yesterday.     Pertinent History  R TTA, ESRD, DM2, HIV, cardiomyopathy, CHF, Tachycardia, A-Fib, Mallory-Weiss Tear, obesity    Limitations  Standing;Walking;House hold activities    Patient Stated Goals  To use bilateral prostheses to dance with wife & swimming,     Currently in Pain?  Yes    Pain Score  7          [facility] PT Assessment - 05/20/18 1417      Ambulation/Gait   Stairs  Yes    Stairs Assistance  4: Min assist    Stairs Assistance Details (indicate cue type and reason)  cued to turn feet slightly sideways to R when ascending due to feet not fitting on stair; when descending pt turned sideways but did not feel safe with one rail + cane and presented with increased trunk flexion and forward lean on rail for support when descending; required one seated rest break after stairs due to fatigue    Stair Management Technique  One rail Right;Step to pattern;Forwards;With cane    Number of Stairs  8    Height of Stairs  6    Ramp  4: Min assist    Ramp Details (indicate cue type and reason)  with RW, significantly shortened steps ascending and descending      6 Minute Walk- Baseline   6 Minute Walk- Baseline  yes    BP (mmHg)  105/62    HR (bpm)  80    02 Sat (%RA)  96 %    Modified Borg Scale for Dyspnea  0- Nothing at all    Perceived Rate of Exertion (Borg)  7- Very, very light      6 Minute walk- Post Test   6 Minute Walk Post Test  yes    BP (mmHg)  129/72    HR (bpm)  89    02 Sat (%RA)  97 %    Modified Borg Scale for Dyspnea  2- Mild shortness of breath    Perceived Rate of Exertion (Borg)  13- Somewhat hard      6 minute walk test results    Aerobic Endurance Distance Walked  451    Endurance additional comments  only able to ambulate x 4 minutes; had to perform 2 standing rest breaks due to UE fatigue; increasing trunk flexion during 6 min walk test                           PT  Education - 05/20/18 1502    Education provided  Yes    Education Details  turn feet sideways on more narrow stairs for increased foot support, alerting dialysis staff to weight of both prosthetics when weighing him for dialysis    Person(s) Educated  Patient    Methods  Explanation    Comprehension  Verbalized understanding       PT Short Term Goals -  05/20/18 1511      PT SHORT TERM GOAL #1   Title  Patient verbalizes proper limb care for sweat management. (All STGs Target Date: 06/17/2018)    Time  1    Period  Months    Status  New      PT SHORT TERM GOAL #2   Title  Patient verbalizes options for pool with & without prostheses to address his goal.     Time  1    Period  Months    Status  New      PT SHORT TERM GOAL #3   Title  Patient performs standing balance with cane support reaching 10" & to floor with supervision.     Time  1    Period  Months    Status  New      PT SHORT TERM GOAL #4   Title  Patient ambulates 200' with cane & prostheses with supervision.     Time  1    Period  Months    Status  New      PT SHORT TERM GOAL #5   Title  Patient negotiates stairs with 1 rails(to exit/enter friend's house), curbs & ramps with cane & prostheses with minA.     Time  1    Period  Months    Status  New      Additional Short Term Goals   Additional Short Term Goals  Yes      PT SHORT TERM GOAL #6   Title  Pt will improve endurance to be able to complete 6 min walk test with 2 standing rest breaks and more upright trunk posture.    Baseline  Completed 4 minutes, 2-3 standing rest breaks    Time  1    Period  Months    Status  New    Target Date  06/17/18        PT Long Term Goals - 05/20/18 1513      PT LONG TERM GOAL #1   Title  Patient verbalizes & demonstrates proper prosthetic care including prostheses with dialysis to enable safe use of bilateral Transtibial prostheses. (All LTGs Target Date: 08/14/2018)    Time  3    Period  Months    Status  New       PT LONG TERM GOAL #2   Title  Patient tolerates wear of bilateral Transtibial prostheses >90% of awake hours without skin issues or limb pain to enable function during most of his day.     Time  3    Period  Months    Status  New      PT LONG TERM GOAL #3   Title  Standing balance with cane or less support reaching 10" anteriorly, to floor, scanning & managing clothes modified independent.     Time  3    Period  Months    Status  New      PT LONG TERM GOAL #4   Title  Patient ambulates 500' outdoors including grass with cane or less & bilateral prostheses modified independent for community mobility.     Time  3    Period  Months    Status  New      PT LONG TERM GOAL #5   Title  Patient ambulates100'  around furniture carrying plate or cup with cane or less & prostheses modified independent for household mobility.     Time  3    Period  Months    Status  New      Additional Long Term Goals   Additional Long Term Goals  Yes      PT LONG TERM GOAL #6   Title  Patient negotiates ramps, curbs & stairs (1 rail) with cane or less & prostheses modified independent for community access.     Time  3    Period  Months    Status  New      PT LONG TERM GOAL #7   Title  Pt will improve endurance during gait by increasing distance on 6 minute walk test by 150' with LRAD    Baseline  451 in 4 minutes; will re-assess at 4 weeks    Time  3    Period  Months    Status  New    Target Date  08/14/18            Plan - 05/20/18 1504    Clinical Impression Statement  Performed assessment of endurance with 6 minute walk test with RW; pt only able to complete 4 minutes of test due to UE fatigue.  As pt fatigued he demonstrated increased trunk flexion and anterior lean/WB through UE on RW.  Following rest break pt able to continue with gait assessment on ramp with RW and stair negotiation with one rail and quad tip cane.  Pt reports he visits a friend's home to play cards and his friend has  6 steps with 2 rails too wide to reach both; pt currently uses one rail and support of friend to negotiate.  On stairs and ramp to continued to demonstrate signficant posterior hip and core weakness with increased trunk flexion and support through UE on rail or RW.  Pt required prolonged seated rest break at end of session due to fatigue and sudden abdominal pain; pt reports pain is new since beginning dialysis.  With rest and small amount of water, pain subsided.  Will continue to address to progress towards LTG.    Rehab Potential  Good    PT Frequency  2x / week 13 weeks (90 days)    PT Duration  Other (comment)    PT Treatment/Interventions  ADLs/Self Care Home Management;Canalith Repostioning;DME Instruction;Gait training;Stair training;Functional mobility training;Therapeutic activities;Therapeutic exercise;Balance training;Patient/family education;Prosthetic Training;Vestibular    PT Next Visit Plan  Assess gait with cane with quad tip, curb with RW.  posterior hip and core weakness to improve upright posture with decreased support through UE.  Stair negotiation with R rail and cane in LUE    Consulted and Agree with Plan of Care  Patient;Family member/caregiver    Family Member Consulted  wife, Albi [place]       Patient will benefit from skilled therapeutic intervention in order to improve the following deficits and impairments:  Abnormal gait, Cardiopulmonary status limiting activity, Decreased activity tolerance, Decreased balance, Decreased endurance, Decreased mobility, Decreased knowledge of use of DME, Decreased range of motion, Decreased scar mobility, Decreased strength, Postural dysfunction, Prosthetic Dependency  Visit Diagnosis: Unsteadiness on feet  Other abnormalities of gait and mobility  History of falling  Muscle weakness (generalized)  Abnormal posture     Problem List Patient Active Problem List   Diagnosis Date Noted  . Chronic systolic heart failure ([place])   .  Demand ischemia ([place])   . Atrial fibrillation with RVR ([place]) 05/02/2018  . Murmur, cardiac 02/13/2018  . Mallory-Weiss tear 01/16/2018  . Morbid obesity ([place])   . Fall   . Subtherapeutic  international normalized ratio (INR)   . History of left below knee amputation ([place]) 12/11/2017  . History of supraventricular tachycardia   . Chronic diastolic congestive heart failure ([place])   . S/P bilateral BKA (below knee amputation) ([place])   . Anemia of chronic disease   . Leukocytosis   . Non-ischemic cardiomyopathy ([place])   . Hypokalemia 09/04/2017  . Essential hypertension 09/03/2017  . GERD (gastroesophageal reflux disease) 09/03/2017  . GIB (gastrointestinal bleeding) 07/26/2017  . Anemia due to end stage renal disease ([place]) 07/26/2017  . PAF (paroxysmal atrial fibrillation) ([place]) 07/26/2017  . Symptomatic anemia 07/26/2017  . End stage renal disease ([place]) 05/05/2017  . HIV disease ([place]) 03/25/2017  . Hypertensive heart disease with end stage renal disease on dialysis ([place]) 03/13/2017  . ESRD on dialysis ([place]) 03/13/2017  . Type 2 diabetes mellitus with complication ([place]) 26/41/5830  . SVT (supraventricular tachycardia) ([place])     Rico Junker, PT, DPT 05/20/18    3:21 PM    [facility] [address] [place], [place], [address] Phone: [phone]   Fax:  [phone]  Name: Thomas [name] MRN: [id number] Date of Birth: 10-02-1966

## 2018-05-25 ENCOUNTER — Other Ambulatory Visit: Payer: Self-pay | Admitting: Internal Medicine

## 2018-05-25 DIAGNOSIS — I471 Supraventricular tachycardia: Secondary | ICD-10-CM

## 2018-05-26 MED FILL — BIKTARVY 50-200-25 MG TABS: 50-200-25 | 30 days supply | Qty: 30 | Fill #3

## 2018-05-27 ENCOUNTER — Ambulatory Visit (INDEPENDENT_AMBULATORY_CARE_PROVIDER_SITE_OTHER): Payer: Medicare Other | Admitting: *Deleted

## 2018-05-27 DIAGNOSIS — Z5181 Encounter for therapeutic drug level monitoring: Secondary | ICD-10-CM

## 2018-05-27 DIAGNOSIS — I4891 Unspecified atrial fibrillation: Secondary | ICD-10-CM | POA: Diagnosis not present

## 2018-05-27 LAB — POCT INR: INR: 3.3 — AB (ref 2.0–3.0)

## 2018-05-27 MED ORDER — WARFARIN SODIUM 7.5 MG PO TABS
ORAL_TABLET | ORAL | 1 refills | Status: DC
Start: 2018-05-27 — End: 2018-07-16

## 2018-05-27 NOTE — Patient Instructions (Addendum)
A full discussion of the nature of anticoagulants has been carried out.  A benefit risk analysis has been presented to the patient, so that they understand the justification for choosing anticoagulation at this time. The need for frequent and regular monitoring, precise dosage adjustment and compliance is stressed.  Side effects of potential bleeding are discussed.  The patient should avoid any OTC items containing aspirin or ibuprofen, and should avoid great swings in general diet.  Avoid alcohol consumption.  Call if any signs of abnormal bleeding.   Description   Hold today's dose, then start taking 1 tablet daily except 1/2 tablet on Sundays. Recheck INR in 1 week. [facility] [phone] Main [phone]

## 2018-05-29 ENCOUNTER — Ambulatory Visit: Payer: Medicare Other | Admitting: Physical Therapy

## 2018-05-29 ENCOUNTER — Encounter: Payer: Self-pay | Admitting: Physical Therapy

## 2018-05-29 DIAGNOSIS — R2689 Other abnormalities of gait and mobility: Secondary | ICD-10-CM

## 2018-05-29 DIAGNOSIS — R2681 Unsteadiness on feet: Secondary | ICD-10-CM | POA: Diagnosis not present

## 2018-05-29 DIAGNOSIS — M6281 Muscle weakness (generalized): Secondary | ICD-10-CM

## 2018-05-29 DIAGNOSIS — R293 Abnormal posture: Secondary | ICD-10-CM

## 2018-05-29 DIAGNOSIS — Z9181 History of falling: Secondary | ICD-10-CM

## 2018-05-29 NOTE — Therapy (Signed)
[facility] [address] [place], [place], [address] Phone: [phone]   Fax:  [phone]  Physical Therapy Treatment  Patient Details  Name: Thomas [name] MRN: [id number] Date of Birth: [birth year] Referring Provider: Meridee Score, MD   Encounter Date: 05/29/2018  PT End of Session - 05/29/18 1619    Visit Number  3    Number of Visits  26    Date for PT Re-Evaluation  08/14/18    Authorization Type  Medicare & Generic commercial    PT Start Time  1618    PT Stop Time  0354    PT Time Calculation (min)  40 min    Equipment Utilized During Treatment  Gait belt    Activity Tolerance  Patient tolerated treatment well;Patient limited by fatigue    Behavior During Therapy  WFL for tasks assessed/performed       Past Medical History:  Diagnosis Date  . Anemia   . Chronic combined systolic and diastolic CHF (congestive heart failure) ([place])   . Diabetes ([place])   . Diabetic foot ulcer ([place]) 09/03/2017  . Diabetic wet gangrene of the foot ([place]) 09/04/2017  . ESRD (end stage renal disease) [facility])    [address] T, Th, Sat  . HIV disease ([place])   . Hyperparathyroidism, secondary ([place])   . Hypertension   . Hypertensive heart disease with end stage renal disease on dialysis ([place]) 03/13/2017  . Subacute osteomyelitis, right ankle and foot ([place])   . SVT (supraventricular tachycardia) ([place])    ? afib or atrial flutter s/p TEE/DCCV with subsequent ablation due to reoccurrence in [place]  . Type 2 diabetes mellitus ([place])     Past Surgical History:  Procedure Laterality Date  . AMPUTATION Left    foot  . AMPUTATION Right 12/06/2017   Procedure: AMPUTATION BELOW KNEE;  Surgeon: [name], MD;  Location: [facility];  Service: Orthopedics;  Laterality: Right;  . APPLICATION OF WOUND VAC Right 09/04/2017   Procedure: APPLICATION OF WOUND VAC;  Surgeon: [name], DPM;  Location: [facility];  Service: Podiatry;  Laterality: Right;   . APPLICATION OF WOUND VAC  12/06/2017   Procedure: APPLICATION OF WOUND VAC;  Surgeon: [name], MD;  Location: [facility];  Service: Orthopedics;;  . COLONOSCOPY WITH PROPOFOL N/A 07/30/2017   Procedure: COLONOSCOPY WITH PROPOFOL;  Surgeon: [name], MD;  Location: [facility];  Service: Endoscopy;  Laterality: N/A;  . ESOPHAGOGASTRODUODENOSCOPY (EGD) WITH PROPOFOL N/A 07/28/2017   Procedure: ESOPHAGOGASTRODUODENOSCOPY (EGD) WITH PROPOFOL;  Surgeon: [name], MD;  Location: [facility];  Service: Endoscopy;  Laterality: N/A;  . ESOPHAGOGASTRODUODENOSCOPY (EGD) WITH PROPOFOL N/A 01/15/2018   Procedure: ESOPHAGOGASTRODUODENOSCOPY (EGD) WITH PROPOFOL;  Surgeon: [name], MD;  Location: [facility] ENDOSCOPY;  Service: Endoscopy;  Laterality: N/A;  . FLEXIBLE SIGMOIDOSCOPY N/A 07/27/2017   Procedure: FLEXIBLE SIGMOIDOSCOPY;  Surgeon: [name], MD;  Location: [facility] ENDOSCOPY;  Service: Endoscopy;  Laterality: N/A;  . GRAFT APPLICATION Right 65/68/1275   Procedure: SKIN GRAFT APPLICATION RIGHT FOOT;  Surgeon: [name], DPM;  Location: [facility];  Service: Podiatry;  Laterality: Right;  . I&D EXTREMITY Right 09/04/2017   Procedure: IRRIGATION AND DEBRIDEMENT EXTREMITY;  Surgeon: [name], DPM;  Location: [facility];  Service: Podiatry;  Laterality: Right;  . I&D EXTREMITY Right 11/12/2017   Procedure: IRRIGATION AND DEBRIDEMENT ULCER RIGHT FOOT;  Surgeon: [name], DPM;  Location: [facility];  Service: Podiatry;  Laterality:  Right;  Marland Kitchen RIGHT AV FISTULA PLACEMENT  10/19/2012  . WOUND DEBRIDEMENT N/A 09/24/2017   Procedure: DEBRIDEMENT WOUND;  Surgeon: [name], DPM;  Location: [facility];  Service: Podiatry;  Laterality: N/A;    There were no vitals filed for this visit.  Subjective Assessment - 05/29/18 1619    Subjective  No new complaints. No falls. Reports he has been practicing the stairs at home.     Pertinent History  R TTA, ESRD, DM2, HIV, cardiomyopathy, CHF,  Tachycardia, A-Fib, Mallory-Weiss Tear, obesity    Limitations  Standing;Walking;House hold activities            [facility] Adult PT Treatment/Exercise - 05/29/18 1622      Transfers   Transfers  Sit to Stand;Stand to Sit    Sit to Stand  6: Modified independent (Device/Increase time);With upper extremity assist;With armrests;From chair/3-in-1;Other (comment)    Stand to Sit  6: Modified independent (Device/Increase time);With upper extremity assist;With armrests;To chair/3-in-1;Other (comment)      Ambulation/Gait   Ambulation/Gait  Yes    Ambulation/Gait Assistance  5: Supervision;4: Min guard;4: Min assist    Ambulation/Gait Assistance Details  pt continues to exhibit an excessive UE reliance on RW with gait. cues provided for more upright posture, step placement, base of support to assist with balance. with cane; min assist with cues on posture, weight shifting and cane placement needed. pt with increased lateral sway, decreased weight shifting onto the right and fwd flexion with fatigue.     Ambulation Distance (Feet)  100 Feet x1, 90 x1 RW;  60 x1 with cane    Assistive device  Rolling walker;Straight cane;Prostheses    Gait Pattern  Step-through pattern;Decreased arm swing - right;Decreased step length - left;Decreased stance time - right;Decreased stride length;Antalgic;Trunk flexed;Wide base of support    Ambulation Surface  Level;Indoor    Stairs  Yes    Stairs Assistance  4: Min assist;4: Min guard    Stairs Assistance Details (indicate cue type and reason)  with 2 rails: worked on reciprocal gait pattern with emphasis on prosthetic foot placement. pt uncomfortable with this technique at this time and prefers to descend with step to pattern sideways. progressed to use of 1 rail/1 cane on stairs with continued cues on sequencing, weight shifitng and prosthetic foot placement. did address wtih pt alternating lead leg with both ascending and descending stairs.                             Stair Management Technique  Two rails;One rail Right;One rail Left;Step to pattern;Forwards;Sideways    Number of Stairs  4 x2    Height of Stairs  6    Pre-Gait Activities  with cane: multiple laps around table working on sequencing and posture.       Knee/Hip Exercises: Aerobic   Other Aerobic  Scifit UE/LE's level 1.5 for 8 minutes with goal >/= 80 rpm for strengthening/activity tolerance      Prosthetics   Current prosthetic wear tolerance (days/week)   daily    Current prosthetic wear tolerance (#hours/day)   most awake hours for bil prostheses, removing to dry as needed with rest about half way through day    Residual limb condition   right LE: 2 dry scabs on incision line. possible suture working toward surface on lateral aspect of incision.     Education Provided  Residual limb care;Proper wear schedule/adjustment;Proper weight-bearing schedule/adjustment  Person(s) Educated  Patient    Education Method  Explanation;Demonstration;Verbal cues    Education Method  Verbalized understanding;Verbal cues required;Needs further instruction          PT Education - 05/30/18 0004    Education provided  Yes    Education Details  discussed return to gym to work on strengthening and endurance. Pt used recumbant bike in session today with no issues. Also reviewed weight machines he can use and appropriate ways to set them up.    Person(s) Educated  Patient    Methods  Explanation;Demonstration;Verbal cues    Comprehension  Verbalized understanding;Returned demonstration;Need further instruction;Verbal cues required       PT Short Term Goals - 05/20/18 1511      PT SHORT TERM GOAL #1   Title  Patient verbalizes proper limb care for sweat management. (All STGs Target Date: 06/17/2018)    Time  1    Period  Months    Status  New      PT SHORT TERM GOAL #2   Title  Patient verbalizes options for pool with & without prostheses to address his goal.     Time  1    Period  Months     Status  New      PT SHORT TERM GOAL #3   Title  Patient performs standing balance with cane support reaching 10" & to floor with supervision.     Time  1    Period  Months    Status  New      PT SHORT TERM GOAL #4   Title  Patient ambulates 200' with cane & prostheses with supervision.     Time  1    Period  Months    Status  New      PT SHORT TERM GOAL #5   Title  Patient negotiates stairs with 1 rails(to exit/enter friend's house), curbs & ramps with cane & prostheses with minA.     Time  1    Period  Months    Status  New      Additional Short Term Goals   Additional Short Term Goals  Yes      PT SHORT TERM GOAL #6   Title  Pt will improve endurance to be able to complete 6 min walk test with 2 standing rest breaks and more upright trunk posture.    Baseline  Completed 4 minutes, 2-3 standing rest breaks    Time  1    Period  Months    Status  New    Target Date  06/17/18        PT Long Term Goals - 05/20/18 1513      PT LONG TERM GOAL #1   Title  Patient verbalizes & demonstrates proper prosthetic care including prostheses with dialysis to enable safe use of bilateral Transtibial prostheses. (All LTGs Target Date: 08/14/2018)    Time  3    Period  Months    Status  New      PT LONG TERM GOAL #2   Title  Patient tolerates wear of bilateral Transtibial prostheses >90% of awake hours without skin issues or limb pain to enable function during most of his day.     Time  3    Period  Months    Status  New      PT LONG TERM GOAL #3   Title  Standing balance with cane or less support reaching 10" anteriorly, to  floor, scanning & managing clothes modified independent.     Time  3    Period  Months    Status  New      PT LONG TERM GOAL #4   Title  Patient ambulates 500' outdoors including grass with cane or less & bilateral prostheses modified independent for community mobility.     Time  3    Period  Months    Status  New      PT LONG TERM GOAL #5   Title   Patient ambulates100'  around furniture carrying plate or cup with cane or less & prostheses modified independent for household mobility.     Time  3    Period  Months    Status  New      Additional Long Term Goals   Additional Long Term Goals  Yes      PT LONG TERM GOAL #6   Title  Patient negotiates ramps, curbs & stairs (1 rail) with cane or less & prostheses modified independent for community access.     Time  3    Period  Months    Status  New      PT LONG TERM GOAL #7   Title  Pt will improve endurance during gait by increasing distance on 6 minute walk test by 150' with LRAD    Baseline  451 in 4 minutes; will re-assess at 4 weeks    Time  3    Period  Months    Status  New    Target Date  08/14/18            Plan - 05/29/18 1620    Clinical Impression Statement  Today's skilled session continued to address gait/barriers with bil prostheses and LRAD. Also addressed return to gym to further progress strengthening and activity tolerance. Pt is progressing toward goals and should benefit from continued PT to progress toward unmet goals.     Rehab Potential  Good    PT Frequency  2x / week 13 weeks (90 days)    PT Duration  Other (comment)    PT Treatment/Interventions  ADLs/Self Care Home Management;Canalith Repostioning;DME Instruction;Gait training;Stair training;Functional mobility training;Therapeutic activities;Therapeutic exercise;Balance training;Patient/family education;Prosthetic Training;Vestibular    PT Next Visit Plan  continue to work on balance without UE support, core/hip strengthening and gait with cane on level surfaces, RW with barriers.    Consulted and Agree with Plan of Care  Patient;Family member/caregiver    Family Member Consulted  wife, Braxston Quinter       Patient will benefit from skilled therapeutic intervention in order to improve the following deficits and impairments:  Abnormal gait, Cardiopulmonary status limiting activity, Decreased activity  tolerance, Decreased balance, Decreased endurance, Decreased mobility, Decreased knowledge of use of DME, Decreased range of motion, Decreased scar mobility, Decreased strength, Postural dysfunction, Prosthetic Dependency  Visit Diagnosis: Unsteadiness on feet  Other abnormalities of gait and mobility  History of falling  Muscle weakness (generalized)  Abnormal posture     Problem List Patient Active Problem List   Diagnosis Date Noted  . Encounter for therapeutic drug monitoring 05/27/2018  . Chronic systolic heart failure ([place])   . Demand ischemia ([place])   . Atrial fibrillation with RVR ([place]) 05/02/2018  . Murmur, cardiac 02/13/2018  . Mallory-Weiss tear 01/16/2018  . Morbid obesity ([place])   . Fall   . Subtherapeutic international normalized ratio (INR)   . History of left below knee amputation ([place]) 12/11/2017  .  History of supraventricular tachycardia   . Chronic diastolic congestive heart failure ([place])   . S/P bilateral BKA (below knee amputation) ([place])   . Anemia of chronic disease   . Leukocytosis   . Non-ischemic cardiomyopathy ([place])   . Hypokalemia 09/04/2017  . Essential hypertension 09/03/2017  . GERD (gastroesophageal reflux disease) 09/03/2017  . GIB (gastrointestinal bleeding) 07/26/2017  . Anemia due to end stage renal disease ([place]) 07/26/2017  . PAF (paroxysmal atrial fibrillation) ([place]) 07/26/2017  . Symptomatic anemia 07/26/2017  . End stage renal disease ([place]) 05/05/2017  . HIV disease ([place]) 03/25/2017  . Hypertensive heart disease with end stage renal disease on dialysis ([place]) 03/13/2017  . ESRD on dialysis ([place]) 03/13/2017  . Type 2 diabetes mellitus with complication ([place]) 59/93/5701  . SVT (supraventricular tachycardia) ([place])     Willow Ora, PTA, [facility] [address], [place],  [address] [phone] 05/30/18, 12:11 AM   Name: Thomas [name] MRN: [id number] Date of Birth: [birth year]

## 2018-06-01 ENCOUNTER — Ambulatory Visit: Payer: Medicare Other | Attending: Orthopedic Surgery | Admitting: Physical Therapy

## 2018-06-01 ENCOUNTER — Encounter: Payer: Self-pay | Admitting: Physical Therapy

## 2018-06-01 DIAGNOSIS — D631 Anemia in chronic kidney disease: Secondary | ICD-10-CM | POA: Diagnosis not present

## 2018-06-01 DIAGNOSIS — M6281 Muscle weakness (generalized): Secondary | ICD-10-CM | POA: Insufficient documentation

## 2018-06-01 DIAGNOSIS — R7989 Other specified abnormal findings of blood chemistry: Secondary | ICD-10-CM | POA: Diagnosis not present

## 2018-06-01 DIAGNOSIS — Z89512 Acquired absence of left leg below knee: Secondary | ICD-10-CM | POA: Diagnosis not present

## 2018-06-01 DIAGNOSIS — Z89511 Acquired absence of right leg below knee: Secondary | ICD-10-CM | POA: Diagnosis not present

## 2018-06-01 DIAGNOSIS — I480 Paroxysmal atrial fibrillation: Secondary | ICD-10-CM | POA: Diagnosis not present

## 2018-06-01 DIAGNOSIS — R293 Abnormal posture: Secondary | ICD-10-CM | POA: Insufficient documentation

## 2018-06-01 DIAGNOSIS — E1152 Type 2 diabetes mellitus with diabetic peripheral angiopathy with gangrene: Secondary | ICD-10-CM | POA: Diagnosis not present

## 2018-06-01 DIAGNOSIS — Z8679 Personal history of other diseases of the circulatory system: Secondary | ICD-10-CM | POA: Diagnosis not present

## 2018-06-01 DIAGNOSIS — E8889 Other specified metabolic disorders: Secondary | ICD-10-CM | POA: Diagnosis not present

## 2018-06-01 DIAGNOSIS — N2581 Secondary hyperparathyroidism of renal origin: Secondary | ICD-10-CM | POA: Diagnosis not present

## 2018-06-01 DIAGNOSIS — Z9181 History of falling: Secondary | ICD-10-CM | POA: Insufficient documentation

## 2018-06-01 DIAGNOSIS — N186 End stage renal disease: Secondary | ICD-10-CM | POA: Diagnosis not present

## 2018-06-01 DIAGNOSIS — Z21 Asymptomatic human immunodeficiency virus [HIV] infection status: Secondary | ICD-10-CM | POA: Diagnosis not present

## 2018-06-01 DIAGNOSIS — M25662 Stiffness of left knee, not elsewhere classified: Secondary | ICD-10-CM | POA: Insufficient documentation

## 2018-06-01 DIAGNOSIS — Z79899 Other long term (current) drug therapy: Secondary | ICD-10-CM | POA: Diagnosis not present

## 2018-06-01 DIAGNOSIS — I132 Hypertensive heart and chronic kidney disease with heart failure and with stage 5 chronic kidney disease, or end stage renal disease: Secondary | ICD-10-CM | POA: Diagnosis not present

## 2018-06-01 DIAGNOSIS — I481 Persistent atrial fibrillation: Secondary | ICD-10-CM | POA: Diagnosis not present

## 2018-06-01 DIAGNOSIS — R2681 Unsteadiness on feet: Secondary | ICD-10-CM | POA: Insufficient documentation

## 2018-06-01 DIAGNOSIS — M25672 Stiffness of left ankle, not elsewhere classified: Secondary | ICD-10-CM | POA: Insufficient documentation

## 2018-06-01 DIAGNOSIS — E1122 Type 2 diabetes mellitus with diabetic chronic kidney disease: Secondary | ICD-10-CM | POA: Diagnosis not present

## 2018-06-01 DIAGNOSIS — Z7901 Long term (current) use of anticoagulants: Secondary | ICD-10-CM | POA: Diagnosis not present

## 2018-06-01 DIAGNOSIS — R2689 Other abnormalities of gait and mobility: Secondary | ICD-10-CM | POA: Insufficient documentation

## 2018-06-01 DIAGNOSIS — I4891 Unspecified atrial fibrillation: Secondary | ICD-10-CM | POA: Diagnosis present

## 2018-06-01 DIAGNOSIS — Z794 Long term (current) use of insulin: Secondary | ICD-10-CM | POA: Diagnosis not present

## 2018-06-01 DIAGNOSIS — Z885 Allergy status to narcotic agent status: Secondary | ICD-10-CM | POA: Diagnosis not present

## 2018-06-01 DIAGNOSIS — Z992 Dependence on renal dialysis: Secondary | ICD-10-CM | POA: Diagnosis not present

## 2018-06-01 DIAGNOSIS — Z886 Allergy status to analgesic agent status: Secondary | ICD-10-CM | POA: Diagnosis not present

## 2018-06-01 DIAGNOSIS — I5042 Chronic combined systolic (congestive) and diastolic (congestive) heart failure: Secondary | ICD-10-CM | POA: Diagnosis not present

## 2018-06-02 NOTE — Therapy (Signed)
[facility] [address] [place], [place], [address] Phone: [phone]   Fax:  [phone]  Physical Therapy Treatment  Patient Details  Name: Thomas [name] MRN: [id number] Date of Birth: Nov 26, 1966 Referring Provider: Meridee Score, MD   Encounter Date: 06/01/2018  PT End of Session - 06/01/18 1143    Visit Number  4    Number of Visits  26    Date for PT Re-Evaluation  08/14/18    Authorization Type  Medicare & Generic commercial    PT Start Time  1139    PT Stop Time  1220    PT Time Calculation (min)  41 min    Equipment Utilized During Treatment  Gait belt    Activity Tolerance  Patient tolerated treatment well;Patient limited by fatigue    Behavior During Therapy  [place] for tasks assessed/performed       Past Medical History:  Diagnosis Date  . Anemia   . Chronic combined systolic and diastolic CHF (congestive heart failure) ([employer])   . Diabetes ([place])   . Diabetic foot ulcer ([place]) 09/03/2017  . Diabetic wet gangrene of the foot ([place]) 09/04/2017  . ESRD (end stage renal disease) [facility])    [facility] [address] T, Th, Sat  . HIV disease ([place])   . Hyperparathyroidism, secondary ([place])   . Hypertension   . Hypertensive heart disease with end stage renal disease on dialysis ([place]) 03/13/2017  . Subacute osteomyelitis, right ankle and foot ([place])   . SVT (supraventricular tachycardia) ([place])    ? afib or atrial flutter s/p TEE/DCCV with subsequent ablation due to reoccurrence in [place]  . Type 2 diabetes mellitus ([place])     Past Surgical History:  Procedure Laterality Date  . AMPUTATION Left    foot  . AMPUTATION Right 12/06/2017   Procedure: AMPUTATION BELOW KNEE;  Surgeon: [name], MD;  Location: [facility];  Service: Orthopedics;  Laterality: Right;  . APPLICATION OF WOUND VAC Right 09/04/2017   Procedure: APPLICATION OF WOUND VAC;  Surgeon: [name], DPM;  Location: [facility];  Service: Podiatry;  Laterality: Right;   . APPLICATION OF WOUND VAC  12/06/2017   Procedure: APPLICATION OF WOUND VAC;  Surgeon: [name], MD;  Location: [facility];  Service: Orthopedics;;  . COLONOSCOPY WITH PROPOFOL N/A 07/30/2017   Procedure: COLONOSCOPY WITH PROPOFOL;  Surgeon: [name], MD;  Location: [facility];  Service: Endoscopy;  Laterality: N/A;  . ESOPHAGOGASTRODUODENOSCOPY (EGD) WITH PROPOFOL N/A 07/28/2017   Procedure: ESOPHAGOGASTRODUODENOSCOPY (EGD) WITH PROPOFOL;  Surgeon: [name], MD;  Location: [facility];  Service: Endoscopy;  Laterality: N/A;  . ESOPHAGOGASTRODUODENOSCOPY (EGD) WITH PROPOFOL N/A 01/15/2018   Procedure: ESOPHAGOGASTRODUODENOSCOPY (EGD) WITH PROPOFOL;  Surgeon: [name], MD;  Location: [facility] ENDOSCOPY;  Service: Endoscopy;  Laterality: N/A;  . FLEXIBLE SIGMOIDOSCOPY N/A 07/27/2017   Procedure: FLEXIBLE SIGMOIDOSCOPY;  Surgeon: [name], MD;  Location[facility] ENDOSCOPY;  Service: Endoscopy;  Laterality: N/A;  . GRAFT APPLICATION Right 38/18/2993   Procedure: SKIN GRAFT APPLICATION RIGHT FOOT;  Surgeon: [name], DPM;  Location: [facility];  Service: Podiatry;  Laterality: Right;  . I&D EXTREMITY Right 09/04/2017   Procedure: IRRIGATION AND DEBRIDEMENT EXTREMITY;  Surgeon: [name], DPM;  Location: [facility];  Service: Podiatry;  Laterality: Right;  . I&D EXTREMITY Right 11/12/2017   Procedure: IRRIGATION AND DEBRIDEMENT ULCER RIGHT FOOT;  Surgeon: [name], DPM;  Location: [facility];  Service: Podiatry;  Laterality:  Right;  Marland Kitchen RIGHT AV FISTULA PLACEMENT  10/19/2012  . WOUND DEBRIDEMENT N/A 09/24/2017   Procedure: DEBRIDEMENT WOUND;  Surgeon: [name], DPM;  Location: [facility];  Service: Podiatry;  Laterality: N/A;    There were no vitals filed for this visit.  Subjective Assessment - 06/01/18 1142    Subjective  No new complaints. No falls or pain to report. Having issues finding the cane with a wide handle.     Pertinent History  R TTA, ESRD, DM2, HIV,  cardiomyopathy, CHF, Tachycardia, A-Fib, Mallory-Weiss Tear, obesity    Limitations  Standing;Walking;House hold activities    Patient Stated Goals  To use bilateral prostheses to dance with wife & swimming,     Currently in Pain?  No/denies    Pain Score  0-No pain          [facility] Adult PT Treatment/Exercise - 06/01/18 1144      Transfers   Transfers  Sit to Stand;Stand to Sit    Sit to Stand  6: Modified independent (Device/Increase time);With upper extremity assist;With armrests;From chair/3-in-1;Other (comment)    Stand to Sit  6: Modified independent (Device/Increase time);With upper extremity assist;With armrests;To chair/3-in-1;Other (comment)      Ambulation/Gait   Ambulation/Gait  Yes    Ambulation/Gait Assistance  5: Supervision supervision with RW    Ambulation/Gait Assistance Details  cues needed on posture, base of support, step length and weight shifting with gait.                      Ambulation Distance (Feet)  92 Feet x1, plus around gym between activities    Assistive device  Rolling walker;Straight cane;Prostheses rubber quad tip    Gait Pattern  Step-through pattern;Decreased arm swing - right;Decreased step length - left;Decreased stance time - right;Decreased stride length;Antalgic;Trunk flexed;Wide base of support    Ambulation Surface  Level;Indoor    Stairs  Yes    Stairs Assistance  4: Min guard    Stair Management Technique  One rail Right;One rail Left;Step to pattern;Forwards;With cane    Number of Stairs  4 x2 reps    Height of Stairs  6    Ramp  Other (comment);4: Min assist;3: Mod assist min guard assist with RW;    Ramp Details (indicate cue type and reason)  x1 with RW; x1 with cane with min to mod assist with 2cd person for safety    Curb  5: Supervision;3: Mod assist supervision iwth RW    Curb Details (indicate cue type and reason)  x1 with RW with supervision; min to descend, mod to ascend with cane. cues needed on sequencing and technique.        High Level Balance   High Level Balance Activities  Side stepping;Backward walking;Marching forwards    High Level Balance Comments  in parallel bars: 3 laps each with cues for light UE support with both UEs to no UE support. min guard assist       Prosthetics   Current prosthetic wear tolerance (days/week)   daily    Current prosthetic wear tolerance (#hours/day)   most awake hours for bil prostheses, removing to dry as needed with rest about half way through day    Residual limb condition   right LE: 2 dry scabs on incision line. possible suture working toward surface on lateral aspect of incision.     Education Provided  Residual limb care;Proper wear schedule/adjustment;Proper weight-bearing schedule/adjustment directions to [facility] to get  cane.     Person(s) Educated  Patient    Education Method  Explanation;Demonstration;Verbal cues;Handout          PT Short Term Goals - 05/20/18 1511      PT SHORT TERM GOAL #1   Title  Patient verbalizes proper limb care for sweat management. (All STGs Target Date: 06/17/2018)    Time  1    Period  Months    Status  New      PT SHORT TERM GOAL #2   Title  Patient verbalizes options for pool with & without prostheses to address his goal.     Time  1    Period  Months    Status  New      PT SHORT TERM GOAL #3   Title  Patient performs standing balance with cane support reaching 10" & to floor with supervision.     Time  1    Period  Months    Status  New      PT SHORT TERM GOAL #4   Title  Patient ambulates 200' with cane & prostheses with supervision.     Time  1    Period  Months    Status  New      PT SHORT TERM GOAL #5   Title  Patient negotiates stairs with 1 rails(to exit/enter friend's house), curbs & ramps with cane & prostheses with minA.     Time  1    Period  Months    Status  New      Additional Short Term Goals   Additional Short Term Goals  Yes      PT SHORT TERM GOAL #6   Title  Pt will improve endurance to be  able to complete 6 min walk test with 2 standing rest breaks and more upright trunk posture.    Baseline  Completed 4 minutes, 2-3 standing rest breaks    Time  1    Period  Months    Status  New    Target Date  06/17/18        PT Long Term Goals - 05/20/18 1513      PT LONG TERM GOAL #1   Title  Patient verbalizes & demonstrates proper prosthetic care including prostheses with dialysis to enable safe use of bilateral Transtibial prostheses. (All LTGs Target Date: 08/14/2018)    Time  3    Period  Months    Status  New      PT LONG TERM GOAL #2   Title  Patient tolerates wear of bilateral Transtibial prostheses >90% of awake hours without skin issues or limb pain to enable function during most of his day.     Time  3    Period  Months    Status  New      PT LONG TERM GOAL #3   Title  Standing balance with cane or less support reaching 10" anteriorly, to floor, scanning & managing clothes modified independent.     Time  3    Period  Months    Status  New      PT LONG TERM GOAL #4   Title  Patient ambulates 500' outdoors including grass with cane or less & bilateral prostheses modified independent for community mobility.     Time  3    Period  Months    Status  New      PT LONG TERM GOAL #5   Title  Patient ambulates100'  around furniture carrying plate or cup with cane or less & prostheses modified independent for household mobility.     Time  3    Period  Months    Status  New      Additional Long Term Goals   Additional Long Term Goals  Yes      PT LONG TERM GOAL #6   Title  Patient negotiates ramps, curbs & stairs (1 rail) with cane or less & prostheses modified independent for community access.     Time  3    Period  Months    Status  New      PT LONG TERM GOAL #7   Title  Pt will improve endurance during gait by increasing distance on 6 minute walk test by 150' with LRAD    Baseline  451 in 4 minutes; will re-assess at 4 weeks    Time  3    Period  Months     Status  New    Target Date  08/14/18            Plan - 06/01/18 1144    Clinical Impression Statement  Today's skilled session continued to address gait/barriers with bil prostheses and straight cane. Increased assistance needed with ramps/curbs, pt will need additional training. Remainder of session addressed balance with decreasing UE support as able. Pt is progressing toward goals and should benefit from continued PT to progress toward unmet goals.     Rehab Potential  Good    PT Frequency  2x / week 13 weeks (90 days)    PT Duration  Other (comment)    PT Treatment/Interventions  ADLs/Self Care Home Management;Canalith Repostioning;DME Instruction;Gait training;Stair training;Functional mobility training;Therapeutic activities;Therapeutic exercise;Balance training;Patient/family education;Prosthetic Training;Vestibular    PT Next Visit Plan  continue to work on balance without UE support, core/hip strengthening and gait with cane on level surfaces and barriers    Consulted and Agree with Plan of Care  Patient;Family member/caregiver    Family Member Consulted  wife, Antrone Walla       Patient will benefit from skilled therapeutic intervention in order to improve the following deficits and impairments:  Abnormal gait, Cardiopulmonary status limiting activity, Decreased activity tolerance, Decreased balance, Decreased endurance, Decreased mobility, Decreased knowledge of use of DME, Decreased range of motion, Decreased scar mobility, Decreased strength, Postural dysfunction, Prosthetic Dependency  Visit Diagnosis: Unsteadiness on feet  Other abnormalities of gait and mobility  Muscle weakness (generalized)     Problem List Patient Active Problem List   Diagnosis Date Noted  . Encounter for therapeutic drug monitoring 05/27/2018  . Chronic systolic heart failure ([place])   . Demand ischemia ([place])   . Atrial fibrillation with RVR ([place]) 05/02/2018  . Murmur, cardiac 02/13/2018  .  Mallory-Weiss tear 01/16/2018  . Morbid obesity ([place])   . Fall   . Subtherapeutic international normalized ratio (INR)   . History of left below knee amputation ([place]) 12/11/2017  . History of supraventricular tachycardia   . Chronic diastolic congestive heart failure ([place])   . S/P bilateral BKA (below knee amputation) ([place])   . Anemia of chronic disease   . Leukocytosis   . Non-ischemic cardiomyopathy ([place])   . Hypokalemia 09/04/2017  . Essential hypertension 09/03/2017  . GERD (gastroesophageal reflux disease) 09/03/2017  . GIB (gastrointestinal bleeding) 07/26/2017  . 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 PAF (paroxysmal atrial fibrillation) ([place]) 07/26/2017  . Symptomatic anemia 07/26/2017  .  End stage renal disease ([place]) 05/05/2017  . HIV disease ([place]) 03/25/2017  . Hypertensive heart disease with end stage renal disease on dialysis ([place]) 03/13/2017  . ESRD on dialysis ([place]) 03/13/2017  . Type 2 diabetes mellitus with complication ([place]) 27/25/3664  . SVT (supraventricular tachycardia) ([place])    Willow Ora, PTA, [facility] [address] [place], [place] [address] [phone] 06/02/18, 9:39 AM   Name: Thomas [name] MRN: [id number] Date of Birth: Jul 01, 1966

## 2018-06-03 ENCOUNTER — Ambulatory Visit: Payer: Medicare Other | Admitting: Physical Therapy

## 2018-06-03 ENCOUNTER — Ambulatory Visit (INDEPENDENT_AMBULATORY_CARE_PROVIDER_SITE_OTHER): Payer: Medicare Other | Admitting: Pharmacist

## 2018-06-03 DIAGNOSIS — I4891 Unspecified atrial fibrillation: Secondary | ICD-10-CM

## 2018-06-03 DIAGNOSIS — Z5181 Encounter for therapeutic drug level monitoring: Secondary | ICD-10-CM

## 2018-06-03 LAB — POCT INR: INR: 3.8 — AB (ref 2.0–3.0)

## 2018-06-03 NOTE — Patient Instructions (Signed)
Description   Hold today's dose, then start taking 1 tablet daily except 1/2 tablet on Sundays, Tuesdays and Fridays. Recheck INR in 1 week. [facility] [phone] Main [phone]

## 2018-06-04 ENCOUNTER — Encounter (HOSPITAL_COMMUNITY): Payer: Self-pay

## 2018-06-04 ENCOUNTER — Inpatient Hospital Stay (HOSPITAL_COMMUNITY)
Admission: EM | Admit: 2018-06-04 | Discharge: 2018-06-05 | DRG: 308 | Disposition: A | Discharge status: Home / Self Care | Payer: Medicare Other | Attending: Internal Medicine | Admitting: Internal Medicine

## 2018-06-04 ENCOUNTER — Emergency Department (HOSPITAL_COMMUNITY): Payer: Medicare Other

## 2018-06-04 DIAGNOSIS — Z7901 Long term (current) use of anticoagulants: Secondary | ICD-10-CM | POA: Diagnosis not present

## 2018-06-04 DIAGNOSIS — I132 Hypertensive heart and chronic kidney disease with heart failure and with stage 5 chronic kidney disease, or end stage renal disease: Secondary | ICD-10-CM | POA: Diagnosis not present

## 2018-06-04 DIAGNOSIS — Z886 Allergy status to analgesic agent status: Secondary | ICD-10-CM

## 2018-06-04 DIAGNOSIS — E1152 Type 2 diabetes mellitus with diabetic peripheral angiopathy with gangrene: Secondary | ICD-10-CM | POA: Diagnosis present

## 2018-06-04 DIAGNOSIS — Z21 Asymptomatic human immunodeficiency virus [HIV] infection status: Secondary | ICD-10-CM | POA: Diagnosis present

## 2018-06-04 DIAGNOSIS — E118 Type 2 diabetes mellitus with unspecified complications: Secondary | ICD-10-CM | POA: Diagnosis present

## 2018-06-04 DIAGNOSIS — Z79899 Other long term (current) drug therapy: Secondary | ICD-10-CM

## 2018-06-04 DIAGNOSIS — Z885 Allergy status to narcotic agent status: Secondary | ICD-10-CM | POA: Diagnosis not present

## 2018-06-04 DIAGNOSIS — I5032 Chronic diastolic (congestive) heart failure: Secondary | ICD-10-CM | POA: Diagnosis present

## 2018-06-04 DIAGNOSIS — Z992 Dependence on renal dialysis: Secondary | ICD-10-CM | POA: Diagnosis not present

## 2018-06-04 DIAGNOSIS — E8889 Other specified metabolic disorders: Secondary | ICD-10-CM | POA: Diagnosis present

## 2018-06-04 DIAGNOSIS — D631 Anemia in chronic kidney disease: Secondary | ICD-10-CM | POA: Diagnosis present

## 2018-06-04 DIAGNOSIS — I5042 Chronic combined systolic (congestive) and diastolic (congestive) heart failure: Secondary | ICD-10-CM | POA: Diagnosis present

## 2018-06-04 DIAGNOSIS — I10 Essential (primary) hypertension: Secondary | ICD-10-CM | POA: Diagnosis present

## 2018-06-04 DIAGNOSIS — Z89512 Acquired absence of left leg below knee: Secondary | ICD-10-CM

## 2018-06-04 DIAGNOSIS — B20 Human immunodeficiency virus [HIV] disease: Secondary | ICD-10-CM | POA: Diagnosis present

## 2018-06-04 DIAGNOSIS — I4891 Unspecified atrial fibrillation: Secondary | ICD-10-CM | POA: Diagnosis present

## 2018-06-04 DIAGNOSIS — N2581 Secondary hyperparathyroidism of renal origin: Secondary | ICD-10-CM | POA: Diagnosis not present

## 2018-06-04 DIAGNOSIS — I5023 Acute on chronic systolic (congestive) heart failure: Secondary | ICD-10-CM | POA: Diagnosis present

## 2018-06-04 DIAGNOSIS — Z89511 Acquired absence of right leg below knee: Secondary | ICD-10-CM

## 2018-06-04 DIAGNOSIS — I481 Persistent atrial fibrillation: Secondary | ICD-10-CM | POA: Diagnosis present

## 2018-06-04 DIAGNOSIS — E1122 Type 2 diabetes mellitus with diabetic chronic kidney disease: Secondary | ICD-10-CM | POA: Diagnosis present

## 2018-06-04 DIAGNOSIS — I480 Paroxysmal atrial fibrillation: Principal | ICD-10-CM | POA: Diagnosis present

## 2018-06-04 DIAGNOSIS — R7989 Other specified abnormal findings of blood chemistry: Secondary | ICD-10-CM | POA: Diagnosis present

## 2018-06-04 DIAGNOSIS — N186 End stage renal disease: Secondary | ICD-10-CM | POA: Diagnosis not present

## 2018-06-04 DIAGNOSIS — Z794 Long term (current) use of insulin: Secondary | ICD-10-CM

## 2018-06-04 DIAGNOSIS — Z8679 Personal history of other diseases of the circulatory system: Secondary | ICD-10-CM

## 2018-06-04 LAB — CBC WITH DIFFERENTIAL/PLATELET
Abs Immature Granulocytes: 0.1 10*3/uL (ref 0.0–0.1)
BASOS ABS: 0.1 10*3/uL (ref 0.0–0.1)
Basophils Relative: 1 %
EOS ABS: 0.3 10*3/uL (ref 0.0–0.7)
EOS PCT: 4 %
HEMATOCRIT: 33.4 % — AB (ref 39.0–52.0)
Hemoglobin: 11.2 g/dL — ABNORMAL LOW (ref 13.0–17.0)
IMMATURE GRANULOCYTES: 1 %
LYMPHS ABS: 1.3 10*3/uL (ref 0.7–4.0)
Lymphocytes Relative: 14 %
MCH: 33.9 pg (ref 26.0–34.0)
MCHC: 33.5 g/dL (ref 30.0–36.0)
MCV: 101.2 fL — ABNORMAL HIGH (ref 78.0–100.0)
Monocytes Absolute: 0.7 10*3/uL (ref 0.1–1.0)
Monocytes Relative: 8 %
NEUTROS PCT: 72 %
Neutro Abs: 6.3 10*3/uL (ref 1.7–7.7)
PLATELETS: 227 10*3/uL (ref 150–400)
RBC: 3.3 MIL/uL — AB (ref 4.22–5.81)
RDW: 16.9 % — AB (ref 11.5–15.5)
WBC: 8.8 10*3/uL (ref 4.0–10.5)

## 2018-06-04 LAB — COMPREHENSIVE METABOLIC PANEL
ALBUMIN: 3.5 g/dL (ref 3.5–5.0)
ALT: 18 U/L (ref 17–63)
ANION GAP: 14 (ref 5–15)
AST: 22 U/L (ref 15–41)
Alkaline Phosphatase: 88 U/L (ref 38–126)
BILIRUBIN TOTAL: 1.2 mg/dL (ref 0.3–1.2)
BUN: 30 mg/dL — ABNORMAL HIGH (ref 6–20)
CHLORIDE: 95 mmol/L — AB (ref 101–111)
CO2: 27 mmol/L (ref 22–32)
Calcium: 8.5 mg/dL — ABNORMAL LOW (ref 8.9–10.3)
Creatinine, Ser: 7.01 mg/dL — ABNORMAL HIGH (ref 0.61–1.24)
GFR calc Af Amer: 9 mL/min — ABNORMAL LOW (ref >60)
GFR calc non Af Amer: 8 mL/min — ABNORMAL LOW (ref >60)
GLUCOSE: 166 mg/dL — AB (ref 65–99)
POTASSIUM: 3.8 mmol/L (ref 3.5–5.1)
SODIUM: 136 mmol/L (ref 135–145)
TOTAL PROTEIN: 7.2 g/dL (ref 6.5–8.1)

## 2018-06-04 LAB — I-STAT TROPONIN, ED: Troponin i, poc: 0.19 ng/mL (ref 0.00–0.08)

## 2018-06-04 LAB — PROTIME-INR
INR: 3.38
PROTHROMBIN TIME: 34 s — AB (ref 11.4–15.2)

## 2018-06-04 MED ORDER — INSULIN GLARGINE 100 UNIT/ML ~~LOC~~ SOLN
20.0000 [IU] | Freq: Every day | SUBCUTANEOUS | Status: DC
  Administered 2018-06-05: 20 [IU] via SUBCUTANEOUS
  Filled 2018-06-04: qty 0.2

## 2018-06-04 MED ORDER — PANTOPRAZOLE SODIUM 40 MG PO TBEC
80.0000 mg | DELAYED_RELEASE_TABLET | Freq: Every day | ORAL | Status: DC
  Administered 2018-06-05: 80 mg via ORAL
  Filled 2018-06-04: qty 2

## 2018-06-04 MED ORDER — DILTIAZEM LOAD VIA INFUSION
15.0000 mg | Freq: Once | INTRAVENOUS | Status: DC
  Filled 2018-06-04: qty 15

## 2018-06-04 MED ORDER — BICTEGRAVIR-EMTRICITAB-TENOFOV 50-200-25 MG PO TABS
1.0000 | ORAL_TABLET | Freq: Every day | ORAL | Status: DC
  Administered 2018-06-05: 1 via ORAL
  Filled 2018-06-04: qty 1

## 2018-06-04 MED ORDER — DILTIAZEM HCL-DEXTROSE 100-5 MG/100ML-% IV SOLN (PREMIX)
5.0000 mg/h | INTRAVENOUS | Status: DC
  Administered 2018-06-04: 5 mg/h via INTRAVENOUS
  Administered 2018-06-05: 7.5 mg/h via INTRAVENOUS
  Filled 2018-06-04 (×2): qty 100

## 2018-06-04 MED ORDER — METOPROLOL TARTRATE 5 MG/5ML IV SOLN: 5.0000 mg | Freq: Once | INTRAVENOUS | Status: DC

## 2018-06-04 MED ORDER — SODIUM CHLORIDE 0.9 % IV SOLN: 125.0000 mg | INTRAVENOUS | Status: DC

## 2018-06-04 MED ORDER — INSULIN ASPART 100 UNIT/ML ~~LOC~~ SOLN
0.0000 [IU] | Freq: Three times a day (TID) | SUBCUTANEOUS | Status: DC
  Administered 2018-06-05: 3 [IU] via SUBCUTANEOUS
  Administered 2018-06-05: 7 [IU] via SUBCUTANEOUS

## 2018-06-04 MED ORDER — SEVELAMER CARBONATE 800 MG PO TABS
800.0000 mg | ORAL_TABLET | Freq: Three times a day (TID) | ORAL | Status: DC
  Administered 2018-06-05: 800 mg via ORAL
  Filled 2018-06-04 (×2): qty 1

## 2018-06-04 MED ORDER — CALCIUM POLYCARBOPHIL 625 MG PO TABS
625.0000 mg | ORAL_TABLET | Freq: Every day | ORAL | Status: DC
  Administered 2018-06-05: 625 mg via ORAL
  Filled 2018-06-04: qty 1

## 2018-06-04 MED ORDER — CINACALCET HCL 30 MG PO TABS
90.0000 mg | ORAL_TABLET | Freq: Every day | ORAL | Status: DC
  Administered 2018-06-05: 90 mg via ORAL
  Filled 2018-06-04: qty 3

## 2018-06-04 MED ORDER — SODIUM CHLORIDE 0.9 % IV BOLUS
250.0000 mL | Freq: Once | INTRAVENOUS | Status: AC
  Administered 2018-06-04: 250 mL via INTRAVENOUS

## 2018-06-04 MED ORDER — GABAPENTIN 100 MG PO CAPS: 100.0000 mg | ORAL_CAPSULE | ORAL | Status: DC

## 2018-06-04 MED ORDER — METOPROLOL TARTRATE 5 MG/5ML IV SOLN
10.0000 mg | Freq: Once | INTRAVENOUS | Status: AC
  Administered 2018-06-04: 10 mg via INTRAVENOUS
  Filled 2018-06-04: qty 10

## 2018-06-04 MED ORDER — ACETAMINOPHEN 325 MG PO TABS
650.0000 mg | ORAL_TABLET | ORAL | Status: DC | PRN
  Administered 2018-06-05 (×2): 650 mg via ORAL
  Filled 2018-06-04 (×2): qty 2

## 2018-06-04 MED ORDER — ONDANSETRON HCL 4 MG/2ML IJ SOLN: 4.0000 mg | Freq: Four times a day (QID) | INTRAMUSCULAR | Status: DC | PRN

## 2018-06-04 NOTE — ED Notes (Signed)
Paged [place] to [phone]

## 2018-06-04 NOTE — ED Triage Notes (Signed)
Pt arrived via [facility]; pt from dialysis with c/o centralized chest pain; pt stated that it feels that something sitting on his chest when it started; pt completed approx 3.5 hrs of dialysis; Pt c/o diaphoresis/blurred vision when it started but not at this moment. Pt rec'd 324mg  of ASA; Pt ST without change

## 2018-06-04 NOTE — Progress Notes (Signed)
ANTICOAGULATION CONSULT NOTE - Initial Consult  Pharmacy Consult for Warfarin  Indication: atrial fibrillation  Allergies  Allergen Reactions  . Oxycodone-Acetaminophen Other (See Comments)    Hallucinations/delirum    Vital Signs: BP: 110/88 (06/06 2215) Pulse Rate: 145 (06/06 2215)  Labs: Recent Labs    06/03/18 1008 06/04/18 1806  HGB  --  11.2*  HCT  --  33.4*  PLT  --  227  LABPROT  --  34.0*  INR 3.8* 3.38  CREATININE  --  7.01*    CrCl cannot be calculated (Unknown ideal weight.).   Medical History: Past Medical History:  Diagnosis Date  . Anemia   . Chronic combined systolic and diastolic CHF (congestive heart failure) ([place])   . Diabetes ([place])   . Diabetic foot ulcer ([place]) 09/03/2017  . Diabetic wet gangrene of the foot ([place]) 09/04/2017  . ESRD (end stage renal disease) [facility])    [facility] [address] T, Th, Sat  . HIV disease ([place])   . Hyperparathyroidism, secondary ([place])   . Hypertension   . Hypertensive heart disease with end stage renal disease on dialysis ([place]) 03/13/2017  . Subacute osteomyelitis, right ankle and foot ([place])   . SVT (supraventricular tachycardia) ([place])    ? afib or atrial flutter s/p TEE/DCCV with subsequent ablation due to reoccurrence in [place]  . Type 2 diabetes mellitus ([facility])     Assessment: 52 y/o M on warfarin PTA for afib, here from HD with chest pain, continuing warfarin for now, INR is elevated at 3.38, Hgb 11.2, Plts 227  Goal of Therapy:  INR 2-3 Monitor platelets by anticoagulation protocol: Yes   Plan:  -No warfarin tonight -INR with AM labs to assess dosing needs  Narda Bonds 06/04/2018,11:39 PM

## 2018-06-04 NOTE — H&P (Signed)
Date: 06/04/2018               Patient Name:  Thomas [name] MRN: [id number]  DOB: 1966-11-21 Age / Sex: 52 y.o., male   PCP: Patient, No Pcp Per         Medical Service: Internal Medicine Teaching Service         Attending Physician: Dr. Lucious Groves, DO    First Contact: Dr. Amado Coe Pager: [phone]  Second Contact: Dr. Einar Gip Pager: [phone]       After Hours (After 5p/  First Contact Pager: [phone]  weekends / holidays): Second Contact Pager: [phone]   Chief Complaint: Palpitations, bilateral hand numbness, chest pain during dialysis  History of Present Illness:  Thomas [name] is 52 yo with PMH of ESRD on HD T/Th/Sat, insulin dependent T2DM, HTN, HIV (viral load undetectable 01/2018), bilateral LE amputations, and paroxysmal A-Fib on warfarin who presents for evaluation of chest pain, palpitations, and bilateral hand numbness which occurred during dialysis on the day of admission. History obtained directly from the patient. Patient states was in usual state of health prior to dialysis session. In the last 20-30 minutes of the session patient noted sudden onset of increase chest pressure, stating it felt like the "fat lady from the circus was standing on my chest". Patient also at that time noticed that his heart began to race and that both of his hands became numb. Patient states he has never experienced pain like that before and was worried, so sought medical attention. His symptoms self resolved prior to arrival to the ED. IN the past the patient states that he is usually asymptomatic when his heart converts into A-Fib. If he does feel any symptoms, he notices that his heart begins to race. He experienced his symptom when he was admitted 1 month ago for A-Fib with RVR. Since that admission patient has been taking all medications as prescribed and without difficulty.   Upon arrival to the ED patient was found to have heart rate in 140s. He was normotensive with BP ranging from  90s-100s/70s-80s. Patient was stable on room air. CBC with Hgb 11.2 (baseline 11-13). CMP remarkable for Cr = 7, BUN = 30 and K = 3.8. Mg = 2.0. INR = 3.38. Patient's EKG showed atrial fibrillation with RVR and no signs of ST elevation or TWI to suggest acute ischemia. ISTAT troponin = 0.19. CXR without acute abnormality. Patient received one dose IV metoprolol 10 mg and HR continued in 140s. EDP started cardizem drip and IMTS called for admission.   Meds:  Current Meds  Medication Sig  . bictegravir-emtricitabine-tenofovir AF (BIKTARVY) 50-200-25 MG TABS tablet Take 1 tablet by mouth daily.  . camphor-menthol (SARNA) lotion Apply topically as needed for itching.  . cinacalcet (SENSIPAR) 90 MG tablet Take 90 mg by mouth daily.  . ferric gluconate 125 mg in sodium chloride 0.9 % 100 mL Inject 125 mg into the vein Every Tuesday,Thursday,and Saturday with dialysis.  Marland Kitchen gabapentin (NEURONTIN) 100 MG capsule Take 1 capsule (100 mg total) by mouth every dialysis.  Marland Kitchen insulin glargine (LANTUS) 100 unit/mL SOPN Inject 0.3 mLs (30 Units total) into the skin at bedtime. (Patient taking differently: Inject 20 Units into the skin at bedtime. )  . metoprolol tartrate (LOPRESSOR) 50 MG tablet Take 1 tablet (50 mg total) by mouth 2 (two) times daily.  . naproxen sodium (ALEVE) 220 MG tablet Take 220 mg by mouth as needed (pain).   Marland Kitchen  pantoprazole (PROTONIX) 40 MG tablet Take 2 tablets (80 mg total) by mouth daily.  . polycarbophil (FIBERCON) 625 MG tablet Take 1 tablet (625 mg total) by mouth daily.  . sevelamer carbonate (RENVELA) 800 MG tablet Take 800 mg by mouth 3 (three) times daily with meals.   . warfarin (COUMADIN) 7.5 MG tablet Take 1 tablet daily except 1/2 tablet on Sundays or as directed by Anticoagulation Clinic   Allergies: Allergies as of 06/04/2018 - Review Complete 06/04/2018  Allergen Reaction Noted  . Oxycodone-acetaminophen Other (See Comments) 01/13/2018   Past Medical History: Past  Medical History:  Diagnosis Date  . Anemia   . Chronic combined systolic and diastolic CHF (congestive heart failure) ([place])   . Diabetes ([place])   . Diabetic foot ulcer ([place]) 09/03/2017  . Diabetic wet gangrene of the foot ([place]) 09/04/2017  . ESRD (end stage renal disease) [facility])    [facility] [address] T, Th, Sat  . HIV disease ([place])   . Hyperparathyroidism, secondary ([place])   . Hypertension   . Hypertensive heart disease with end stage renal disease on dialysis ([place]) 03/13/2017  . Subacute osteomyelitis, right ankle and foot ([place])   . SVT (supraventricular tachycardia) ([place])    ? afib or atrial flutter s/p TEE/DCCV with subsequent ablation due to reoccurrence in [place]  . Type 2 diabetes mellitus (H[facility])    Family History:  Family History  Problem Relation Age of Onset  . Heart disease Mother   . Diabetes Mother   . Heart disease Father   . Cancer Neg Hx    Social History:  Never smoker. No alcohol or illicit drugs.  Review of Systems: A complete ROS was negative except as per HPI.   Physical Exam: Blood pressure 110/88, pulse (!) 145, resp. rate (!) 21, SpO2 98 %.  Physical Exam  Constitutional: He is oriented to person, place, and time. He appears well-developed and well-nourished. No distress.  HENT:  Mouth/Throat: Oropharynx is clear and moist. No oropharyngeal exudate.  Eyes: Conjunctivae are normal. No scleral icterus.  Cardiovascular: Intact distal pulses.  No murmur heard. Irregular rhythm. Tachycardic.  Respiratory: Effort normal. No respiratory distress. He has no wheezes. He has no rales.  GI: Soft. Bowel sounds are normal. He exhibits no distension. There is no tenderness.  Musculoskeletal:  Bilateral BKA with bilateral prosthetics in place.   Lymphadenopathy:    He has no cervical adenopathy.  Neurological: He is alert and oriented to person, place, and time.  Face strength and sensation intact bilaterally. Tongue midline. Gross motor and sensation to light touch of  upper and lower extremities intact bilaterally.  Skin: Skin is warm and dry. No rash noted. He is not diaphoretic. No erythema.   EKG: personally reviewed my interpretation is atrial fibrillation with rapid ventricular response. No ST elevation or TWI to suggest ongoing ischemia. No peaked T waves to suggest acute electrolyte abnormality.   CXR: personally reviewed my interpretation is no vascular congestion or interstitial edema. Technically difficult to assess pleural effusions, but none observed.   Assessment & Plan by Problem: Active Problems:   Atrial fibrillation with RVR (HCC)  Thomas [name] is 52 yo with PMH of ESRD on HD T/Th/Sat, insulin dependent T2DM, combined HF (LVEF 35-40%, G2DD in 2018), HTN, HIV (viral load undetectable 01/2018), bilateral LE amputations, and paroxysmal A-Fib on warfarin who presented for evaluation of chest pressure during dialysis. He was found to be in A-Fib with RVR on admission. He was admitted to  the internal medicine teaching service for management. The specific problems addressed during admission are as follows:  A-Fib with RVR: EKG on admission consistent with this diagnosis. Patient had mildly elevated POC troponin in ED without signs acute ischemia on this EKG, suggesting ongoing demand ischemia in setting of elevated HR. Patient refractory to IV metoprolol in ED and required diltiazem drip. Current rates still in 110s-130s on drip running at 7.5 mg/hr. Patient may require increase in this medication for sustained improvement in rate control overnight prior to transition back to PO medications. Patient states A-Fib with RVR occurred only intermittently in the past and is concerned that he has had 2 episodes in the past two months. It's possible that patient's A-fib 2/2 worsened structural disease or HF (see discussion below) or because patient requires elevated dose of metoprolol at baseline. Last TSH on 04/2018 within normal limits. Electrolytes within normal  limits on admission.   -Admit to stepdown -Continue diltiazem gtt, goal HR <100 - plan to transition to metoprolol after this achieved (start at 50 mg BID, increase PRN) -Continue Warfarin per pharmacy, INR therapeutic on admission -Consider cardiology consult in AM if rate continues to be uncontrolled on diltiazem drip  ESRD on HD T/Th/Sat: No current signs or symptoms of volume overload or acute electrolyte abnormalities to suggest need for urgent HD. If patient to remain in hospital throughout weekend, will need to consult nephrology tomorrow for inpatient dialysis. On admission patient very worried about fluid status given current diltiazem drip and states he has been undergoing dry weight challenge as outpatient. Reassured patient that if dialysis needed, he would get dialysis while inpatient. -Consult nephrology in AM for inpatient HD -Gabapentin 100 mg with dialysis  Combined HF (LVEF 35-50%, G2DD), Hypertension: Patient normotensive in ED on diltiazem drip. Patient previously on only metoprolol for HF and no lisinopril, IMDUR, hydralazine (presumably 2/2 hypotension and ESRD). Patient would benefit from switching to metoprolol XL for concurrent HF and A-Fib once dose adjustments made for rate control. Patient has not had echocardiography in 1 year and last echo with some structural abnormalities, including severely dilated LA. Wonder if patient would benefit from repeat echo to assess for worsening EF and/or additional structural abnormalities that may be contributing to increased frequency of A-Fib. It may also be worth adding Imdur/hydralazine to his home regimen if BP allows during hospitalization for further management of HFrEF. -Consider metoprolol XL on discharge -Consider repeat echo after A-Fib under better control  Insulin dependent T2DM: Last A1C = 7.7% on 08/2017, suggesting moderate control at baseline on home regimen of SSI with meals and nightly Lantus 30 units.  -CBG monitoring  TID with meals  -SSI TID with meals -Lantus 20 units qHS  HIV, well controlled: Last viral load undetectable in 01/2018. -Continue home Biktarvy daily  FEN/GI: -Renal diet with 1.2L volume restriction -No IVF, replace electrolytes as needed -Protonix 80 mg daily  VTE Prophylaxis: Heparin TID Code Status: Full  Dispo: Admit patient to Inpatient with expected length of stay greater than 2 midnights.  SignedThomasene Ripple, MD 06/04/2018, 11:02 PM  Pager: [phone]

## 2018-06-04 NOTE — ED Provider Notes (Signed)
[facility] Provider Note   CSN: [id number] Arrival date & time: 06/04/18  1714     History   Chief Complaint Chief Complaint  Patient presents with  . Chest Pain    HPI Avyan Livesay is a 52 y.o. male.  Patient is a 52 year old male with a history of anemia, diabetes, end-stage renal disease on dialysis, HIV, atrial fibrillation who is currently on Coumadin and metoprolol presenting today with palpitations that started while getting dialysis.  Patient states for the last few days he has had palpitations and felt tired.  He states today with dialysis he started having chest tightness with the palpitations and some shortness of breath.  He states he has not missed any medications and not any dialysis.  He denies any infectious symptoms.  Patient states he was recently here for the same and had required admission for several days to get his heart rate under control.  His last INR was checked on Wednesday and it was 3.8.  He currently denies any chest discomfort.  He has had no abdominal pain, nausea or vomiting.  He states he has been eating and drinking normally.  The history is provided by the patient.    Past Medical History:  Diagnosis Date  . Anemia   . Chronic combined systolic and diastolic CHF (congestive heart failure) ([place])   . Diabetes ([place])   . Diabetic foot ulcer ([place]) 09/03/2017  . Diabetic wet gangrene of the foot ([place]) 09/04/2017  . ESRD (end stage renal disease) [facility])    [facility] [address] T, Th, Sat  . HIV disease ([place])   . Hyperparathyroidism, secondary ([place])   . Hypertension   . Hypertensive heart disease with end stage renal disease on dialysis ([place]) 03/13/2017  . Subacute osteomyelitis, right ankle and foot ([place])   . SVT (supraventricular tachycardia) ([place])    ? afib or atrial flutter s/p TEE/DCCV with subsequent ablation due to reoccurrence in [place]  . Type 2 diabetes mellitus [facility])     Patient Active Problem List   Diagnosis Date Noted  . Encounter for therapeutic drug monitoring 05/27/2018  . Chronic systolic heart failure ([place])   . Demand ischemia ([place])   . Atrial fibrillation with RVR ([place]) 05/02/2018  . Murmur, cardiac 02/13/2018  . Mallory-Weiss tear 01/16/2018  . Morbid obesity ([place])   . Fall   . Subtherapeutic international normalized ratio (INR)   . History of left below knee amputation ([place]) 12/11/2017  . History of supraventricular tachycardia   . Chronic diastolic congestive heart failure ([place])   . S/P bilateral BKA (below knee amputation) ([place])   . Anemia of chronic disease   . Leukocytosis   . Non-ischemic cardiomyopathy ([place])   . Hypokalemia 09/04/2017  . Essential hypertension 09/03/2017  . GERD (gastroesophageal reflux disease) 09/03/2017  . GIB (gastrointestinal bleeding) 07/26/2017  . Anemia due to end stage renal disease ([place]) 07/26/2017  . PAF (paroxysmal atrial fibrillation) ([place]) 07/26/2017  . Symptomatic anemia 07/26/2017  . End stage renal disease ([place]) 05/05/2017  . HIV disease ([place]) 03/25/2017  . Hypertensive heart disease with end stage renal disease on dialysis ([place]) 03/13/2017  . ESRD on dialysis ([place]) 03/13/2017  . Type 2 diabetes mellitus with complication ([place]) 52/77/8242  . SVT (supraventricular tachycardia) ([facility])     Past Surgical History:  Procedure Laterality Date  . AMPUTATION Left    foot  . AMPUTATION Right 12/06/2017   Procedure: AMPUTATION BELOW KNEE;  Surgeon: Sharol Given,  Illene Regulus, MD;  Location: [facility];  Service: Orthopedics;  Laterality: Right;  . APPLICATION OF WOUND VAC Right 09/04/2017   Procedure: APPLICATION OF WOUND VAC;  Surgeon: [name], DPM;  Location: [facility];  Service: Podiatry;  Laterality: Right;  . APPLICATION OF WOUND VAC  12/06/2017   Procedure: APPLICATION OF WOUND VAC;  Surgeon: [name], MD;  Location: [facility];  Service: Orthopedics;;  . COLONOSCOPY WITH PROPOFOL N/A 07/30/2017   Procedure: COLONOSCOPY WITH PROPOFOL;   Surgeon: [name], MD;  Location: [place];  Service: Endoscopy;  Laterality: N/A;  . ESOPHAGOGASTRODUODENOSCOPY (EGD) WITH PROPOFOL N/A 07/28/2017   Procedure: ESOPHAGOGASTRODUODENOSCOPY (EGD) WITH PROPOFOL;  Surgeon: [name], MD;  Location: [facility];  Service: Endoscopy;  Laterality: N/A;  . ESOPHAGOGASTRODUODENOSCOPY (EGD) WITH PROPOFOL N/A 01/15/2018   Procedure: ESOPHAGOGASTRODUODENOSCOPY (EGD) WITH PROPOFOL;  Surgeon: [name], MD;  Location: [facility] ENDOSCOPY;  Service: Endoscopy;  Laterality: N/A;  . FLEXIBLE SIGMOIDOSCOPY N/A 07/27/2017   Procedure: FLEXIBLE SIGMOIDOSCOPY;  Surgeon: [name], MD;  Location: [facility] ENDOSCOPY;  Service: Endoscopy;  Laterality: N/A;  . GRAFT APPLICATION Right [id number]/85/4627   Procedure: SKIN GRAFT APPLICATION RIGHT FOOT;  Surgeon: [name], DPM;  Location: [facility];  Service: Podiatry;  Laterality: Right;  . I&D EXTREMITY Right 09/04/2017   Procedure: IRRIGATION AND DEBRIDEMENT EXTREMITY;  Surgeon: [name], DPM;  Location: [place];  Service: Podiatry;  Laterality: Right;  . I&D EXTREMITY Right 11/12/2017   Procedure: IRRIGATION AND DEBRIDEMENT ULCER RIGHT FOOT;  Surgeon: [name], DPM;  Location: [place];  Service: Podiatry;  Laterality: Right;  . RIGHT AV FISTULA PLACEMENT  10/19/2012  . WOUND DEBRIDEMENT N/A 09/24/2017   Procedure: DEBRIDEMENT WOUND;  Surgeon: [name], DPM;  Location: [place];  Service: Podiatry;  Laterality: N/A;        Home Medications    Prior to Admission medications   Medication Sig Start Date End Date Taking? Authorizing Provider  bictegravir-emtricitabine-tenofovir AF (BIKTARVY) 50-200-25 MG TABS tablet Take 1 tablet by mouth daily. 03/02/18   Carlyle Basques, MD  camphor-menthol [facility]) lotion Apply topically as needed for itching. 12/11/17   Colbert Ewing, MD  cinacalcet (SENSIPAR) 90 MG tablet Take 90 mg by mouth daily. 04/27/18   [provider]  ferric gluconate 125 mg  in sodium chloride 0.9 % 100 mL Inject 125 mg into the vein Every Tuesday,Thursday,and Saturday with dialysis. 05/10/17   Hosie Poisson, MD  gabapentin (NEURONTIN) 100 MG capsule Take 1 capsule (100 mg total) by mouth every dialysis. 01/09/18   Jamse Arn, MD  insulin glargine (LANTUS) 100 unit/mL SOPN Inject 0.3 mLs (30 Units total) into the skin at bedtime. Patient taking differently: Inject 20 Units into the skin at bedtime.  12/19/17   Angiulli, Lavon Paganini, PA-C  lipase/protease/amylase (CREON) 12000 units CPEP capsule Take 1 capsule (12,000 Units total) by mouth 3 (three) times daily before meals. 12/19/17   Angiulli, Lavon Paganini, PA-C  lisinopril (PRINIVIL,ZESTRIL) 40 MG tablet Take 1 tablet (40 mg total) by mouth daily. Patient not taking: Reported on 05/18/2018 12/19/17   Angiulli, Lavon Paganini, PA-C  metoprolol tartrate (LOPRESSOR) 50 MG tablet Take 1 tablet (50 mg total) by mouth 2 (two) times daily. 05/03/18   Lorella Nimrod, MD  multivitamin (RENA-VIT) TABS tablet Take 1 tablet by mouth at bedtime. 12/19/17   Angiulli, Lavon Paganini, PA-C  naproxen sodium (ALEVE) 220 MG tablet Take 220 mg by mouth as needed (pain).  [provider]  pantoprazole (PROTONIX) 40 MG tablet Take 2 tablets (80 mg total) by mouth daily. 02/16/18   Alphonzo Grieve, MD  pentoxifylline (TRENTAL) 400 MG CR tablet Take 1 tablet (400 mg total) by mouth daily. 03/02/18   [name], MD  polycarbophil (FIBERCON) 625 MG tablet Take 1 tablet (625 mg total) by mouth daily. 12/19/17   Angiulli, Lavon Paganini, PA-C  sevelamer carbonate (RENVELA) 800 MG tablet Take 800 mg by mouth 3 (three) times daily with meals.  01/10/18   [provider]  warfarin (COUMADIN) 7.5 MG tablet Take 1 tablet daily except 1/2 tablet on Sundays or as directed by Anticoagulation Clinic 05/27/18   Sueanne Margarita, MD    Family History Family History  Problem Relation Age of Onset  . Heart disease Mother   . Diabetes Mother   . Heart disease  Father   . Cancer Neg Hx     Social History Social History   Tobacco Use  . Smoking status: Never Smoker  . Smokeless tobacco: Never Used  Substance Use Topics  . Alcohol use: No  . Drug use: No     Allergies   Oxycodone-acetaminophen   Review of Systems Review of Systems  All other systems reviewed and are negative.    Physical Exam Updated Vital Signs BP 102/82   Pulse (!) 143   Resp 15   SpO2 99%   Physical Exam  Constitutional: He is oriented to person, place, and time. He appears well-developed and well-nourished. No distress.  HENT:  Head: Normocephalic and atraumatic.  Mouth/Throat: Oropharynx is clear and moist.  Eyes: Pupils are equal, round, and reactive to light. Conjunctivae and EOM are normal.  Neck: Normal range of motion. Neck supple.  Cardiovascular: Intact distal pulses. An irregularly irregular rhythm present. Tachycardia present.  No murmur heard.  No systolic murmur is present.  No diastolic murmur is present. Pulmonary/Chest: Effort normal and breath sounds normal. No respiratory distress. He has no wheezes. He has no rales.  Abdominal: Soft. He exhibits no distension. There is no tenderness. There is no rebound and no guarding.  Musculoskeletal: Normal range of motion. He exhibits no edema or tenderness.  Bilateral BKA.  Dialysis access in the right forearm currently with pressure dressing and de-accessed  Neurological: He is alert and oriented to person, place, and time.  Skin: Skin is warm and dry. No rash noted. No erythema.  Psychiatric: He has a normal mood and affect. His behavior is normal.  Nursing note and vitals reviewed.    ED Treatments / Results  Labs (all labs ordered are listed, but only abnormal results are displayed) Labs Reviewed  CBC WITH DIFFERENTIAL/PLATELET - Abnormal; Notable for the following components:      Result Value   RBC 3.30 (*)    Hemoglobin 11.2 (*)    HCT 33.4 (*)    MCV 101.2 (*)    RDW 16.9 (*)     All other components within normal limits  COMPREHENSIVE METABOLIC PANEL - Abnormal; Notable for the following components:   Chloride 95 (*)    Glucose, Bld 166 (*)    BUN 30 (*)    Creatinine, Ser 7.01 (*)    Calcium 8.5 (*)    GFR calc non Af Amer 8 (*)    GFR calc Af Amer 9 (*)    All other components within normal limits  PROTIME-INR - Abnormal; Notable for the following components:   Prothrombin Time 34.0 (*)  All other components within normal limits  I-STAT TROPONIN, ED - Abnormal; Notable for the following components:   Troponin i, poc 0.19 (*)    All other components within normal limits    EKG EKG Interpretation  Date/Time:  Thursday June 04 2018 17:20:51 EDT Ventricular Rate:  143 PR Interval:    QRS Duration: 113 QT Interval:  341 QTC Calculation: 526 R Axis:   49 Text Interpretation:  recurrent  Atrial fibrillation Borderline intraventricular conduction delay Prolonged QT interval Confirmed by Blanchie Dessert ([id number]) on 06/04/2018 5:32:01 PM   Radiology Dg Chest Port 1 View  Result Date: 06/04/2018 CLINICAL DATA:  Tachycardia and palpitations. EXAM: PORTABLE CHEST 1 VIEW COMPARISON:  Chest x-ray dated May 02, 2018. FINDINGS: Stable mild cardiomegaly. Normal pulmonary vascularity. No focal consolidation, pleural effusion, or pneumothorax. No acute osseous abnormality. IMPRESSION: No active disease. Electronically Signed   By: Titus Dubin M.D.   On: 06/04/2018 17:55    Procedures Procedures (including critical care time)  Medications Ordered in ED Medications  metoprolol tartrate (LOPRESSOR) injection 5 mg (has no administration in time range)     Initial Impression / Assessment and Plan / ED Course  I have reviewed the triage vital signs and the nursing notes.  Pertinent labs & imaging results that were available during my care of the patient were reviewed by me and considered in my medical decision making (see chart for details).     Patient  returning today for palpitations and chest discomfort.  Patient recently recently discharged 05/03/2018 after having atrial fibrillation with RVR.  Patient takes 50 mg of metoprolol twice daily and states he has not missed any doses.  He denies any recent generalized illness, abdominal pains or fever.  He did have chest discomfort today when his heart started racing while he was on dialysis.  Here patient is noted to be in atrial fibrillation with heart rates in the 140s.  BP is 101/72.  He completed almost a complete round of dialysis except for approximately 20 minutes.  He currently has no chest pain and he is in no acute distress.  During patient's last hospitalization he was given adenosine without improvement of his symptoms and was cardioverted without improvement.  Patient does take Coumadin and last INR this week was 3.8.  Is not been on any recent antibiotics.  CBC, CMP, troponin, chest x-ray pending.  Patient will be given 10 mg of IV Lopressor to try and achieve rate control.  7:12 PM Labs without significant changes.  K wnl and InR 3.38.  CXR without acute findings.  Pt had no response to lopressor so was started on cardizem.  Will admit for further care.  CRITICAL CARE Performed by: Gerardine Peltz Total critical care time: 30 minutes Critical care time was exclusive of separately billable procedures and treating other patients. Critical care was necessary to treat or prevent imminent or life-threatening deterioration. Critical care was time spent personally by me on the following activities: development of treatment plan with patient and/or surrogate as well as nursing, discussions with consultants, evaluation of patient's response to treatment, examination of patient, obtaining history from patient or surrogate, ordering and performing treatments and interventions, ordering and review of laboratory studies, ordering and review of radiographic studies, pulse oximetry and re-evaluation of  patient's condition.   Final Clinical Impressions(s) / ED Diagnoses   Final diagnoses:  Atrial fibrillation with rapid ventricular response [facility])    ED Discharge Orders    None  Blanchie Dessert, MD 06/04/18 2014

## 2018-06-05 ENCOUNTER — Other Ambulatory Visit: Payer: Self-pay

## 2018-06-05 ENCOUNTER — Telehealth: Payer: Self-pay | Admitting: General Practice

## 2018-06-05 DIAGNOSIS — E1122 Type 2 diabetes mellitus with diabetic chronic kidney disease: Secondary | ICD-10-CM | POA: Diagnosis not present

## 2018-06-05 DIAGNOSIS — I4891 Unspecified atrial fibrillation: Secondary | ICD-10-CM | POA: Diagnosis not present

## 2018-06-05 DIAGNOSIS — I132 Hypertensive heart and chronic kidney disease with heart failure and with stage 5 chronic kidney disease, or end stage renal disease: Secondary | ICD-10-CM

## 2018-06-05 DIAGNOSIS — N186 End stage renal disease: Secondary | ICD-10-CM | POA: Diagnosis not present

## 2018-06-05 DIAGNOSIS — I5032 Chronic diastolic (congestive) heart failure: Secondary | ICD-10-CM | POA: Diagnosis not present

## 2018-06-05 DIAGNOSIS — Z992 Dependence on renal dialysis: Secondary | ICD-10-CM

## 2018-06-05 DIAGNOSIS — Z79899 Other long term (current) drug therapy: Secondary | ICD-10-CM | POA: Diagnosis not present

## 2018-06-05 DIAGNOSIS — Z794 Long term (current) use of insulin: Secondary | ICD-10-CM | POA: Diagnosis not present

## 2018-06-05 DIAGNOSIS — Z7901 Long term (current) use of anticoagulants: Secondary | ICD-10-CM

## 2018-06-05 DIAGNOSIS — B20 Human immunodeficiency virus [HIV] disease: Secondary | ICD-10-CM

## 2018-06-05 DIAGNOSIS — I480 Paroxysmal atrial fibrillation: Secondary | ICD-10-CM | POA: Diagnosis not present

## 2018-06-05 LAB — RENAL FUNCTION PANEL
Albumin: 3.4 g/dL — ABNORMAL LOW (ref 3.5–5.0)
Anion gap: 15 (ref 5–15)
BUN: 46 mg/dL — ABNORMAL HIGH (ref 6–20)
CHLORIDE: 97 mmol/L — AB (ref 101–111)
CO2: 23 mmol/L (ref 22–32)
Calcium: 8.7 mg/dL — ABNORMAL LOW (ref 8.9–10.3)
Creatinine, Ser: 8.76 mg/dL — ABNORMAL HIGH (ref 0.61–1.24)
GFR calc Af Amer: 7 mL/min — ABNORMAL LOW (ref >60)
GFR, EST NON AFRICAN AMERICAN: 6 mL/min — AB (ref >60)
Glucose, Bld: 349 mg/dL — ABNORMAL HIGH (ref 65–99)
POTASSIUM: 3.9 mmol/L (ref 3.5–5.1)
Phosphorus: 5.3 mg/dL — ABNORMAL HIGH (ref 2.5–4.6)
Sodium: 135 mmol/L (ref 135–145)

## 2018-06-05 LAB — PROTIME-INR
INR: 3.19
PROTHROMBIN TIME: 32.4 s — AB (ref 11.4–15.2)

## 2018-06-05 LAB — TROPONIN I: TROPONIN I: 0.35 ng/mL — AB (ref <0.03)

## 2018-06-05 LAB — MAGNESIUM: Magnesium: 2 mg/dL (ref 1.7–2.4)

## 2018-06-05 LAB — GLUCOSE, CAPILLARY
GLUCOSE-CAPILLARY: 322 mg/dL — AB (ref 65–99)
Glucose-Capillary: 216 mg/dL — ABNORMAL HIGH (ref 65–99)

## 2018-06-05 MED ORDER — WARFARIN SODIUM 7.5 MG PO TABS
3.7500 mg | ORAL_TABLET | Freq: Once | ORAL | Status: DC
  Filled 2018-06-05: qty 0.5

## 2018-06-05 MED ORDER — METOPROLOL TARTRATE 50 MG PO TABS
50.0000 mg | ORAL_TABLET | Freq: Two times a day (BID) | ORAL | Status: DC
  Administered 2018-06-05: 50 mg via ORAL
  Filled 2018-06-05: qty 1

## 2018-06-05 MED ORDER — WARFARIN - PHARMACIST DOSING INPATIENT: Freq: Every day | Status: DC

## 2018-06-05 MED ORDER — DOXERCALCIFEROL 4 MCG/2ML IV SOLN: 6.0000 ug | INTRAVENOUS | Status: DC

## 2018-06-05 MED ORDER — METOPROLOL SUCCINATE ER 100 MG PO TB24: 100.0000 mg | ORAL_TABLET | Freq: Every day | ORAL | 0 refills | Status: DC

## 2018-06-05 MED ORDER — CHLORHEXIDINE GLUCONATE CLOTH 2 % EX PADS: 6.0000 | MEDICATED_PAD | Freq: Every day | CUTANEOUS | Status: DC

## 2018-06-05 MED ORDER — FERRIC CITRATE 1 GM 210 MG(FE) PO TABS
840.0000 mg | ORAL_TABLET | Freq: Three times a day (TID) | ORAL | Status: DC
  Filled 2018-06-05: qty 4

## 2018-06-05 MED ORDER — INSULIN GLARGINE 100 UNIT/ML ~~LOC~~ SOLN
25.0000 [IU] | Freq: Every day | SUBCUTANEOUS | Status: DC
  Filled 2018-06-05: qty 0.25

## 2018-06-05 NOTE — Consult Note (Addendum)
[facility] Renal Consultation Note    Indication for Consultation:  Management of ESRD/hemodialysis; anemia, hypertension/volume and secondary hyperparathyroidism PCP:  HPI: Thomas [name] is a 52 y.o. male with ESRD 2/2 HTN, DM. HD initiated 2013. Relocated from [place] to [place] in 12/2016. PMH also Afib on chronic Coumadin (prior cardioversion/ablation in 2017 at [facility])  HIV, PVD s/p bilat BKA.   He is admitted for evaluation of Afib with RVR. He presented to ED last night from home with sudden onset of chest tightness and palpitations. He describes as feeling like "someone sitting on my chest". ED course notable for HR in 140s. EKG without ischemic changes. No edema or infiltrates on CXR. Received dose of metoprolol and diltiazem drip started.  Labs: K 3.9, Na 135, Troponin 0.19, Hgb 11.2. INR 3.19.   Seen in room. He feels much better this morning. HR 90s on monitor. Chest pain has resolved and was comfortable overnight. Slept flat in bed. Denies SOB, abd pain, N,V.   Dialyzes TTS at [facility]. Last dialysis Thursday, notable for high fluid gain. He arrived 7.8kg over his dry weight and left 4.3kg over. Treatment was shortened due to hypotension. Frequently gains too much between treatments.   Past Medical History:  Diagnosis Date  . Anemia   . Chronic combined systolic and diastolic CHF (congestive heart failure) ([place])   . Diabetes ([place])   . Diabetic foot ulcer ([place]) 09/03/2017  . Diabetic wet gangrene of the foot ([place]) 09/04/2017  . ESRD (end stage renal disease) [facility])    [address] T, Th, Sat  . HIV disease ([place])   . Hyperparathyroidism, secondary ([place])   . Hypertension   . Hypertensive heart disease with end stage renal disease on dialysis ([place]) 03/13/2017  . Subacute osteomyelitis, right ankle and foot ([place])   . SVT (supraventricular tachycardia) ([place])    ? afib or atrial flutter s/p TEE/DCCV with subsequent ablation due to reoccurrence in [place]  . Type  2 diabetes mellitus ([place])    Past Surgical History:  Procedure Laterality Date  . AMPUTATION Left    foot  . AMPUTATION Right 12/06/2017   Procedure: AMPUTATION BELOW KNEE;  Surgeon: [name], MD;  Location: [place];  Service: Orthopedics;  Laterality: Right;  . APPLICATION OF WOUND VAC Right 09/04/2017   Procedure: APPLICATION OF WOUND VAC;  Surgeon: [name], DPM;  Location: [place];  Service: Podiatry;  Laterality: Right;  . APPLICATION OF WOUND VAC  12/06/2017   Procedure: APPLICATION OF WOUND VAC;  Surgeon: [name], MD;  Location: [place];  Service: Orthopedics;;  . COLONOSCOPY WITH PROPOFOL N/A 07/30/2017   Procedure: COLONOSCOPY WITH PROPOFOL;  Surgeon: [name], MD;  Location: [place];  Service: Endoscopy;  Laterality: N/A;  . ESOPHAGOGASTRODUODENOSCOPY (EGD) WITH PROPOFOL N/A 07/28/2017   Procedure: ESOPHAGOGASTRODUODENOSCOPY (EGD) WITH PROPOFOL;  Surgeon: [name], MD;  Location: [place];  Service: Endoscopy;  Laterality: N/A;  . ESOPHAGOGASTRODUODENOSCOPY (EGD) WITH PROPOFOL N/A 01/15/2018   Procedure: ESOPHAGOGASTRODUODENOSCOPY (EGD) WITH PROPOFOL;  Surgeon: [name], MD;  Location: [facility] END[facility]COPY;  Service: Endoscopy;  Laterality: N/A;  . FLEXIBLE SIGMOIDOSCOPY N/A 07/27/2017   Procedure: FLEXIBLE SIGMOIDOSCOPY;  Surgeon: [name], MD;  Location: [facility] ENDOSCOPY;  Service: Endoscopy;  Laterality: N/A;  . GRAFT APPLICATION Right 32/95/1884   Procedure: SKIN GRAFT APPLICATION RIGHT FOOT;  Surgeon: [name], DPM;  Location: [place];  Service: Podiatry;  Laterality: Right;  . I&D EXTREMITY  Right 09/04/2017   Procedure: IRRIGATION AND DEBRIDEMENT EXTREMITY;  Surgeon: [name], DPM;  Location: [place];  Service: Podiatry;  Laterality: Right;  . I&D EXTREMITY Right 11/12/2017   Procedure: IRRIGATION AND DEBRIDEMENT ULCER RIGHT FOOT;  Surgeon: [name], DPM;  Location: [place];  Service: Podiatry;  Laterality: Right;  . RIGHT AV  FISTULA PLACEMENT  10/19/2012  . WOUND DEBRIDEMENT N/A 09/24/2017   Procedure: DEBRIDEMENT WOUND;  Surgeon: [name], DPM;  Location: [place];  Service: Podiatry;  Laterality: N/A;   Family History  Problem Relation Age of Onset  . Heart disease Mother   . Diabetes Mother   . Heart disease Father   . Cancer Neg Hx    Social History:  reports that he has never smoked. He has never used smokeless tobacco. He reports that he does not drink alcohol or use drugs. Allergies  Allergen Reactions  . Oxycodone-Acetaminophen Other (See Comments)    Hallucinations/delirum   Prior to Admission medications   Medication Sig Start Date End Date Taking? Authorizing Provider  bictegravir-emtricitabine-tenofovir AF (BIKTARVY) 50-200-25 MG TABS tablet Take 1 tablet by mouth daily. 03/02/18  Yes Carlyle Basques, MD  camphor-menthol [facility]) lotion Apply topically as needed for itching. 12/11/17  Yes Colbert Ewing, MD  cinacalcet (SENSIPAR) 90 MG tablet Take 90 mg by mouth daily. 04/27/18  Yes [provider]  ferric gluconate 125 mg in sodium chloride 0.9 % 100 mL Inject 125 mg into the vein Every Tuesday,Thursday,and Saturday with dialysis. 05/10/17  Yes Hosie Poisson, MD  gabapentin (NEURONTIN) 100 MG capsule Take 1 capsule (100 mg total) by mouth every dialysis. 01/09/18  Yes Jamse Arn, MD  insulin glargine (LANTUS) 100 unit/mL SOPN Inject 0.3 mLs (30 Units total) into the skin at bedtime. Patient taking differently: Inject 20 Units into the skin at bedtime.  12/19/17  Yes Angiulli, Lavon Paganini, PA-C  metoprolol tartrate (LOPRESSOR) 50 MG tablet Take 1 tablet (50 mg total) by mouth 2 (two) times daily. 05/03/18  Yes Lorella Nimrod, MD  naproxen sodium (ALEVE) 220 MG tablet Take 220 mg by mouth as needed (pain).    Yes [provider]  pantoprazole (PROTONIX) 40 MG tablet Take 2 tablets (80 mg total) by mouth daily. 02/16/18  Yes Alphonzo Grieve, MD  polycarbophil (FIBERCON) 625 MG  tablet Take 1 tablet (625 mg total) by mouth daily. 12/19/17  Yes Angiulli, Lavon Paganini, PA-C  sevelamer carbonate (RENVELA) 800 MG tablet Take 800 mg by mouth 3 (three) times daily with meals.  01/10/18  Yes [provider]  warfarin (COUMADIN) 7.5 MG tablet Take 1 tablet daily except 1/2 tablet on Sundays or as directed by Anticoagulation Clinic 05/27/18  Yes Turner, Eber Hong, MD  lipase/protease/amylase (CREON) 12000 units CPEP capsule Take 1 capsule (12,000 Units total) by mouth 3 (three) times daily before meals. Patient not taking: Reported on 06/04/2018 12/19/17   Angiulli, Lavon Paganini, PA-C  lisinopril (PRINIVIL,ZESTRIL) 40 MG tablet Take 1 tablet (40 mg total) by mouth daily. Patient not taking: Reported on 05/18/2018 12/19/17   Angiulli, Lavon Paganini, PA-C  multivitamin (RENA-VIT) TABS tablet Take 1 tablet by mouth at bedtime. Patient not taking: Reported on 06/04/2018 12/19/17   Angiulli, Lavon Paganini, PA-C  pentoxifylline (TRENTAL) 400 MG CR tablet Take 1 tablet (400 mg total) by mouth daily. Patient not taking: Reported on 06/04/2018 03/02/18   [name], MD   Current Facility-Administered Medications  Medication Dose Route Frequency Provider Last Rate  Last Dose  . acetaminophen (TYLENOL) tablet 650 mg  650 mg Oral Q4H PRN Jule Ser, DO   650 mg at 06/05/18 0720  . bictegravir-emtricitabine-tenofovir AF (BIKTARVY) 50-200-25 MG per tablet 1 tablet  1 tablet Oral Daily Jule Ser, DO   1 tablet at 06/05/18 0826  . cinacalcet (SENSIPAR) tablet 90 mg  90 mg Oral Q breakfast Jule Ser, DO      . diltiazem (CARDIZEM) 1 mg/mL load via infusion 15 mg  15 mg Intravenous Once Jule Ser, DO   Stopped at 06/04/18 1954   And  . diltiazem (CARDIZEM) 100 mg in dextrose 5% 120mL (1 mg/mL) infusion  5-15 mg/hr Intravenous Continuous Jule Ser, DO 5 mL/hr at 06/05/18 1030 5 mg/hr at 06/05/18 1030  . [START ON 06/06/2018] ferric gluconate (NULECIT) 125 mg in sodium chloride 0.9 % 100 mL  IVPB  125 mg Intravenous Q T,Th,Sa-HD Jule Ser, DO      . [START ON 06/06/2018] gabapentin (NEURONTIN) capsule 100 mg  100 mg Oral Q T,Th,Sa-HD Jule Ser, DO      . insulin aspart (novoLOG) injection 0-9 Units  0-9 Units Subcutaneous TID WC Jule Ser, DO   7 Units at 06/05/18 [phone]  . insulin glargine (LANTUS) injection 20 Units  20 Units Subcutaneous QHS Jule Ser, DO   20 Units at 06/05/18 0207  . metoprolol tartrate (LOPRESSOR) tablet 50 mg  50 mg Oral BID Kalman Shan Ratliff, DO   50 mg at 06/05/18 1121  . ondansetron (ZOFRAN) injection 4 mg  4 mg Intravenous Q6H PRN Jule Ser, DO      . pantoprazole (PROTONIX) EC tablet 80 mg  80 mg Oral Daily Jule Ser, DO   80 mg at 06/05/18 0825  . polycarbophil (FIBERCON) tablet 625 mg  625 mg Oral Q breakfast Jule Ser, DO      . sevelamer carbonate (RENVELA) tablet 800 mg  800 mg Oral TID WC Jule Ser, DO   800 mg at 06/05/18 0825    ROS: As per HPI otherwise negative.  Physical Exam: Vitals:   06/05/18 0228 06/05/18 0230 06/05/18 0517 06/05/18 0739  BP:  97/67 98/63 112/74  Pulse:  (!) 125 (!) 110 91  Resp:  20 20 18   Temp:  98.1 F (36.7 C) 98.2 F (36.8 C) 98 F (36.7 C)  TempSrc:  Oral Oral Oral  SpO2:  99% 100% 100%  Weight: 128.4 kg (283 lb)     Height: 6\' 5"  (1.956 m)        General: WDWN male NAD  Head: NCAT sclera not icteric MMM Neck: Supple. No JVD No masses Lungs: CTA bilaterally without wheezes, rales, or rhonchi. Breathing is unlabored. Heart: RRR 2/6 SEM  Abdomen: soft  Obese NT + BS Lower extremities: s/p bilateral BKA. No stump edema  Neuro: A & O  X 3. Moves all extremities spontaneously. Psych:  Responds to questions appropriately with a normal affect. Dialysis Access: R forearm AVF +bruit   Labs: Basic Metabolic Panel: Recent Labs  Lab 06/04/18 1806 06/05/18 0506  NA 136 135  K 3.8 3.9  CL 95* 97*  CO2 27 23  GLUCOSE 166* 349*  BUN 30* 46*  CREATININE  7.01* 8.76*  CALCIUM 8.5* 8.7*  PHOS  --  5.3*   Liver Function Tests: Recent Labs  Lab 06/04/18 1806 06/05/18 0506  AST 22  --   ALT 18  --   ALKPHOS 88  --   BILITOT 1.2  --  PROT 7.2  --   ALBUMIN 3.5 3.4*   No results for input(s): LIPASE, AMYLASE in the last 168 hours. No results for input(s): AMMONIA in the last 168 hours. CBC: Recent Labs  Lab 06/04/18 1806  WBC 8.8  NEUTROABS 6.3  HGB 11.2*  HCT 33.4*  MCV 101.2*  PLT 227   Cardiac Enzymes: No results for input(s): CKTOTAL, CKMB, CKMBINDEX, TROPONINI in the last 168 hours. CBG: Recent Labs  Lab 06/05/18 0624  GLUCAP 322*   Iron Studies: No results for input(s): IRON, TIBC, TRANSFERRIN, FERRITIN in the last 72 hours. Studies/Results: Dg Chest Port 1 View  Result Date: 06/04/2018 CLINICAL DATA:  Tachycardia and palpitations. EXAM: PORTABLE CHEST 1 VIEW COMPARISON:  Chest x-ray dated May 02, 2018. FINDINGS: Stable mild cardiomegaly. Normal pulmonary vascularity. No focal consolidation, pleural effusion, or pneumothorax. No acute osseous abnormality. IMPRESSION: No active disease. Electronically Signed   By: Titus Dubin M.D.   On: 06/04/2018 17:55   cxr 6/6 > clear  Dialysis Orders:  [facility]  4.25h 180NRe BFR 500/800 EDW 118kg 3K/2.25  R AVF No Heparin Hectorol 32mcg IV TIW   Assessment/Plan: 1. AFib with RVR. Home meds: Coumadin, metoprolol. HR 80s-90s on diltiazem gtt - per primary  2. CHF EF 35-40% on Echo 04/2017  3. ESRD. TTS. No urgent dialysis needs today. HD tomorrow on schedule  4. Hypertension/volume. BP controlled. Euvolemic on exam but not to dry weight. Left 4kg over on Thursday. UF to EDW as tolerated.  CXR clear 5.  Anemia. Hgb 11.2. No ESA needs.  6.  Metabolic bone disease. Continue Hectorol/Auryxia 4 tabs q ac/ Sensipar 90 7.  Nutrition. Renal diet/viatmins/Fluid restriction  8. DM. Insulin per primary 9. HIV. ART per primary   Lynnda Child PA-C [place] Pager [phone] 06/05/2018, 11:40 AM   Pt seen, examined, agree w assess/plan as above with additions as indicated.  [name] MD [employer] pager [phone]    cell [phone] 06/05/2018, 1:37 PM

## 2018-06-05 NOTE — Care Management Note (Signed)
Case Management Note Marvetta Gibbons RN, BSN Unit 4E-Case Manager [phone]  Patient Details  Name: Thomas [name] MRN: [id number] Date of Birth: 08/03/1966  Subjective/Objective:   Pt admitted with afib                 Action/Plan: PTA Pt lived at home with wife, hx of bil LE amputations, ESRD with HD T/T/S- anticipate return home- CM to follow for transition of care needs  Expected Discharge Date:                  Expected Discharge Plan:  Home/Self Care  In-House Referral:     Discharge planning Services  CM Consult  Post Acute Care Choice:    Choice offered to:     DME Arranged:    DME Agency:     HH Arranged:    [place] Agency:     Status of Service:  In process, will continue to follow  If discussed at Long Length of Stay Meetings, dates discussed:    Discharge Disposition:   Additional Comments:  Dawayne Patricia, RN 06/05/2018, 12:10 PM

## 2018-06-05 NOTE — Discharge Summary (Signed)
Name: Thomas [name] MRN: [id number] DOB: 1966-07-17 52 y.o. PCP: Patient, No Pcp Per  Date of Admission: 06/04/2018  5:14 PM Date of Discharge: 06/05/2018 Attending Physician: Lucious Groves, DO  Discharge Diagnosis: 1. Atrial fibrillation with RVR  2. ESRD 3. Combined systolic and diastolic heart failure 4. Essential hypertension 5. Insulin-dependent T2DM 6. HIV  Discharge Medications: Allergies as of 06/05/2018      Reactions   Oxycodone-acetaminophen Other (See Comments)   Hallucinations/delirum      Medication List    STOP taking these medications   lipase/protease/amylase 12000 units Cpep capsule Commonly known as:  CREON   lisinopril 40 MG tablet Commonly known as:  PRINIVIL,ZESTRIL   metoprolol tartrate 50 MG tablet Commonly known as:  LOPRESSOR   multivitamin Tabs tablet   naproxen sodium 220 MG tablet Commonly known as:  ALEVE   pentoxifylline 400 MG CR tablet Commonly known as:  TRENTAL     TAKE these medications   bictegravir-emtricitabine-tenofovir AF 50-200-25 MG Tabs tablet Commonly known as:  BIKTARVY Take 1 tablet by mouth daily.   camphor-menthol lotion Commonly known as:  SARNA Apply topically as needed for itching.   cinacalcet 90 MG tablet Commonly known as:  SENSIPAR Take 90 mg by mouth daily.   ferric gluconate 125 mg in sodium chloride 0.9 % 100 mL Inject 125 mg into the vein Every Tuesday,Thursday,and Saturday with dialysis.   gabapentin 100 MG capsule Commonly known as:  NEURONTIN Take 1 capsule (100 mg total) by mouth every dialysis.   insulin glargine 100 unit/mL Sopn Commonly known as:  LANTUS Inject 0.3 mLs (30 Units total) into the skin at bedtime. What changed:  how much to take   metoprolol succinate 100 MG 24 hr tablet Commonly known as:  TOPROL XL Take 1 tablet (100 mg total) by mouth daily. Take with or immediately following a meal.   pantoprazole 40 MG tablet Commonly known as:  PROTONIX Take 2 tablets (80 mg  total) by mouth daily.   polycarbophil 625 MG tablet Commonly known as:  FIBERCON Take 1 tablet (625 mg total) by mouth daily.   sevelamer carbonate 800 MG tablet Commonly known as:  RENVELA Take 800 mg by mouth 3 (three) times daily with meals.   warfarin 7.5 MG tablet Commonly known as:  COUMADIN Take as directed. If you are unsure how to take this medication, talk to your nurse or doctor. Original instructions:  Take 1 tablet daily except 1/2 tablet on Sundays or as directed by Anticoagulation Clinic       Disposition and follow-up:   [name] was discharged from [facility] in Stable condition.  At the hospital follow up visit please address:  1.  Please assess for ongoing chest pressure and bilateral hand tingling. Please assess compliance with metoprolol XL 100 mg QD. Please ensure patient has called cardiologist office to schedule a follow up appointment.   2.  Labs / imaging needed at time of follow-up: None   3.  Pending labs/ test needing follow-up: None   Follow-up Appointments: Follow-up Information    Sueanne Margarita, MD Follow up.   Specialty:  Cardiology Why:  PLease call your cardiologist and schedule a follow up appointment.  Contact information: [address] [place] [address] [phone]        [facility] Follow up on 06/15/2018.   Specialty:  Internal Medicine Why:  June 17th @10 :45am Contact information: [place]  [address] [id number] mc [place] [facility] Hospital Course by problem list:  Thomas [name] is 52 yo with PMH of ESRD on HD T/Th/Sat, insulin dependent T2DM,combined HF (LVEF 35-40%, G2DD in 2018),HTN, HIV (viral load undetectable 01/2018), bilateral LE amputations,and paroxysmal A-Fib on warfarin who presented for evaluation of chest pressure during dialysis and was found to be in A-Fib with RVR.  1. Atrial fibrillation with  RVR: Patient presented in chest pressure, palpitations, and tingling in bilateral hands that started during HD and was found to be in Afib with RVR on arrival to the ED. He was recently admitted one month for the same and discharged on metoprolol 50 mg BID. However, he reported taking metoprolol once a day instead of twice daily. He was started on Cardizem gtt with resolution in symptoms and RVR and was transitioned to oral metoprolol XL 100 mg QD. He has a follow up appointment on 6/17.  He was also instructed to follow-up with cardiology (seen Dr. [name] in the past).    2. ESRD: Patient appeared euvolemic on exam and did not receive hemodyalisis during this admission.   3. Combined systolic and diastolic heart failure: Patient appeared euvolemic on exam. Home metoprolol  Mg BID changed to metoprolol XL 100 mg daily as above.   4. Essential hypertension: Metoprolol changed as above.   5. Insulin-dependent T2DM:  Home regimen resumed on discharge.   6. HIV: Home Biktarvy was continued during this admission.    Discharge Vitals:   BP 138/84 (BP Location: Left Arm)   Pulse 87   Temp 98.9 F (37.2 C) (Oral)   Resp 18   Ht 6\' 5"  (1.956 m)   Wt 283 lb (128.4 kg)   SpO2 97%   BMI 33.56 kg/m   Pertinent Labs, Studies, and Procedures:   CBC Latest Ref Rng & Units 06/04/2018 05/03/2018 05/02/2018  WBC 4.0 - 10.5 K/uL 8.8 6.5 -  Hemoglobin 13.0 - 17.0 g/dL 11.2(L) 11.2(L) 13.3  Hematocrit 39.0 - 52.0 % 33.4(L) 33.8(L) 39.0  Platelets 150 - 400 K/uL 227 212 -   BMP Latest Ref Rng & Units 06/05/2018 06/04/2018 05/03/2018  Glucose 65 - 99 mg/dL 349(H) 166(H) 259(H)  BUN 6 - 20 mg/dL 46(H) 30(H) 40(H)  Creatinine 0.61 - 1.24 mg/dL 8.76(H) 7.01(H) 7.82(H)  Sodium 135 - 145 mmol/L 135 136 137  Potassium 3.5 - 5.1 mmol/L 3.9 3.8 3.4(L)  Chloride 101 - 111 mmol/L 97(L) 95(L) 98(L)  CO2 22 - 32 mmol/L 23 27 25   Calcium 8.9 - 10.3 mg/dL 8.7(L) 8.5(L) 8.4(L)    CXR 6/6: FINDINGS: Stable mild  cardiomegaly. Normal pulmonary vascularity. No focal consolidation, pleural effusion, or pneumothorax. No acute osseous abnormality.   Discharge Instructions: Discharge Instructions    Call MD for:  difficulty breathing, headache or visual disturbances   Complete by:  As directed    Call MD for:  extreme fatigue   Complete by:  As directed    Call MD for:  persistant dizziness or light-headedness   Complete by:  As directed    Diet - low sodium heart healthy   Complete by:  As directed    Discharge instructions   Complete by:  As directed    [name],   You were admitted due to a rapid heart beat caused by atrial fibrillation. We started you on a new medication called metoprolol. You will take 1 tablet once a day. This will  help keep your heart rate under control. You have a hosptial follow up appointment on June 17th at 10:45 in the Internal medicine clinic. Also please call your cardiologist's office and schedule a follow up appointment. She can help adjust your heart medications further if needed and prevent a future hospitalization.   Increase activity slowly   Complete by:  As directed       Signed: Welford Roche, MD 06/08/2018, 6:44 AM   Pager: [phone]

## 2018-06-05 NOTE — Telephone Encounter (Signed)
Hospital f/u per Dr Kalman Shan; pt appt 06/17 1045am/NW

## 2018-06-05 NOTE — Progress Notes (Signed)
Thomas [name] to be D/C'd Home per MD order. Discussed with the patient and all questions fully answered.    IV catheter discontinued intact. Site without signs and symptoms of complications. Dressing and pressure applied.  An After Visit Summary was printed and given to the patient.  Patient escorted via [place], and D/C home via private auto.  [name]  06/05/2018 3:34 PM

## 2018-06-05 NOTE — Progress Notes (Signed)
ANTICOAGULATION CONSULT NOTE - Initial Consult  Pharmacy Consult for Warfarin  Indication: atrial fibrillation  Allergies  Allergen Reactions  . Oxycodone-Acetaminophen Other (See Comments)    Hallucinations/delirum   Vital Signs: Temp: 98.9 F (37.2 C) (06/07 1308) Temp Source: Oral (06/07 1308) BP: 138/84 (06/07 1308) Pulse Rate: 87 (06/07 1308)  Labs: Recent Labs    06/03/18 1008 06/04/18 1806 06/05/18 0506 06/05/18 1056  HGB  --  11.2*  --   --   HCT  --  33.4*  --   --   PLT  --  227  --   --   LABPROT  --  34.0* 32.4*  --   INR 3.8* 3.38 3.19  --   CREATININE  --  7.01* 8.76*  --   TROPONINI  --   --   --  0.35*   Estimated Creatinine Clearance: 14.8 mL/min (A) (by C-G formula based on SCr of 8.76 mg/dL (H)).  Assessment: 64 yoM on warfarin PTA for afib. Pharmacy consulted for warfarin management. Patient last seen in warfarin clinic on 6/5 with INR 3.8. Instructed to hold dose on 6/5. Admitted on 6/6 with INR 3.38. Warfarin held again on 6/6, INR now 3.19. Hgb 11.2, pltc WNL. No bleeding noted.  PTA warfarin regimen: 3.75mg  SuTuF, 7.5mg  all other days (admit INR 3.38)  Goal of Therapy:  INR 2-3 Monitor platelets by anticoagulation protocol: Yes   Plan:  Warfarin 3.75mg  PO x1 Daily INR Monitor s/sx bleeding  Erin N. Gerarda Fraction, PharmD PGY1 Pharmacy Resident Pager: [phone] 06/05/2018,1:28 PM

## 2018-06-05 NOTE — Progress Notes (Signed)
   Subjective:  No acute events overnight. Patient reports doing well this morning. Denies chest pain, palpitations, SOB, and tingling in hands. Does reports a history of Afib for about 2 years and has followed up with Dr. [name] with cardiology.   Objective:  Vital signs in last 24 hours: Vitals:   06/05/18 0228 06/05/18 0230 06/05/18 0517 06/05/18 0739  BP:  97/67 98/63 112/74  Pulse:  (!) 125 (!) 110 91  Resp:  20 20 18   Temp:  98.1 F (36.7 C) 98.2 F (36.8 C) 98 F (36.7 C)  TempSrc:  Oral Oral Oral  SpO2:  99% 100% 100%  Weight: 283 lb (128.4 kg)     Height: 6\' 5"  (1.956 m)      Physical Exam  Constitutional: He is oriented to person, place, and time and well-developed, well-nourished, and in no distress.  Cardiovascular:  Irregularly irregular rhythm, no mrg   Pulmonary/Chest: Effort normal. No respiratory distress.  Musculoskeletal: He exhibits deformity (s/p bilateral BKAs).  Neurological: He is alert and oriented to person, place, and time.    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   # A-Fib with RVR: Patient asymptomatic this morning. Chest pressure and tingling in hands now resolved. Telemetry reviewed with adequate HR control. Remains on Cardizem gtt. Will transition to oral metoprolol as below and wean off of Cardizem gtt. He does follow up with Dr. [name] with  cardiology.  - Start metoprolol 50 mg BID  - Wean off of diltiazem gtt  - Continue Warfarin per pharmacy - Patient aware he will need to continue to follow up with Cardiology as an outpatient  - Troponin 0.35. This is in the setting of ESRD. It was 0.36 1 month ago. Low suspicion for ACS.   # ESRD on HD T/Th/Sat: No current signs or symptoms of volume overload or acute electrolyte abnormalities to suggest need for urgent HD. Will hold on nephrology consult as patient might be discharged later today vs early tomorrow morning.   # Combined HF (LVEF 35-50%, G2DD) # Hypertension BP improving. Plan to transition to oral metop from Dilt gtt as stated above. Appears euvolemic on exam.  - Metoprolol as above  - Avoiding CCB in the setting of HF with low EF  - Recommended patient to follow up with Dr. [name] as an outpatient   # Insulin dependent T2DM: CBG in the 300s.  - SSI TID with meals + CBG monitoring  - Lantus 20 --> 25units qHS  # HIV, well controlled: Last viral load undetectable in 01/2018. -Continue home Biktarvy daily   Dispo: Anticipated discharge in approximately today-1 day(s).   Welford Roche, MD 06/05/2018, 11:06 AM Pager: [phone]

## 2018-06-05 NOTE — Progress Notes (Signed)
Unit received call from [facility]. Pt had previously drawn INR of 4.1 - caller uncertain of date/time of draw. Per conversation pt often has elevated INR, requested pt have f/u scheduled for monitoring. Will update attending team.  Fritz Pickerel, RN

## 2018-06-08 ENCOUNTER — Telehealth: Payer: Self-pay | Admitting: Physical Therapy

## 2018-06-08 ENCOUNTER — Ambulatory Visit: Payer: Medicare Other | Admitting: Physical Therapy

## 2018-06-08 NOTE — Telephone Encounter (Signed)
Dr. Heber [facility] PT has been following Mr. [place] at [facility] for prosthetic training with bilateral Transtibial Amputations. He was seen in hospital for Atrial fibrillation with RVR on 06/04/2018. He was not seen for PT today, 06/29/76 as uncertain of medical clearance. Patient arrived to appointment ambulating with walker & bilateral prostheses without SOB or symptoms. He has another PT appointment on Wednesday, 06/10/2018. I am sending this message to get medical clearance to resume PT after his recent hospitalization.  Please respond to this message if we can resume PT at this time or need to hold temporarily.   Thank you Jamey Reas, PT, DPT PT Specializing in [place] 06/08/2018@ 12:13 PM Phone:  [phone]  Fax:  [phone] [facility] [address] [place], [place] [address]

## 2018-06-08 NOTE — Telephone Encounter (Signed)
Called pt - no one answered; left message to call us back.

## 2018-06-08 NOTE — Therapy (Signed)
[facility] [address] [place], [place], [address] Phone: [phone]   Fax:  [phone]  Patient Details  Name: Thomas [name] MRN: [id number] Date of Birth: Apr 28, 1966 Referring Provider:  [name], MD  Encounter Date: 06/08/2018  Medical Hold per primary PT recommendation due to hospital admission.  Bjorn Loser, PTA  06/08/18, 12:17 PM [facility] [address] [place], [place], [address] Phone: [phone]   Fax:  [phone]

## 2018-06-10 ENCOUNTER — Ambulatory Visit: Payer: Medicare Other | Admitting: Physical Therapy

## 2018-06-10 ENCOUNTER — Ambulatory Visit (INDEPENDENT_AMBULATORY_CARE_PROVIDER_SITE_OTHER): Payer: Medicare Other | Admitting: *Deleted

## 2018-06-10 DIAGNOSIS — I4891 Unspecified atrial fibrillation: Secondary | ICD-10-CM | POA: Diagnosis not present

## 2018-06-10 DIAGNOSIS — Z5181 Encounter for therapeutic drug level monitoring: Secondary | ICD-10-CM

## 2018-06-10 LAB — POCT INR: INR: 1.8 — AB (ref 2.0–3.0)

## 2018-06-10 NOTE — Patient Instructions (Signed)
Description   Today take 1.5 tablets then continue taking 1 tablet daily except 1/2 tablet on Sundays, Tuesdays and Fridays. Recheck INR in 1 week. [facility] [phone] [address] [phone]

## 2018-06-10 NOTE — Telephone Encounter (Signed)
Yes please resume physical therapy, he is medically cleared.

## 2018-06-10 NOTE — Telephone Encounter (Signed)
Dr. Heber [place] PT has been following Mr. [place] at [facility] for prosthetic training with bilateral Transtibial Amputations. He was seen in hospital for Atrial fibrillation with RVR on 06/04/2018. He was not seen for PT today, 06/20/28 as uncertain of medical clearance. Patient arrived to appointment ambulating with walker & bilateral prostheses without SOB or symptoms. He has another PT appointment on Wednesday, 06/10/2018. I am sending this message to get medical clearance to resume PT after his recent hospitalization.  Please respond to this message if we can resume PT at this time or need to hold temporarily.   Thank you Jamey Reas, PT, DPT PT Specializing in [place] 06/08/2018@ 12:13 PM Phone:  [phone]  Fax:  [phone] [facility] [address] [place], [place] [address]

## 2018-06-10 NOTE — Telephone Encounter (Signed)
Spoke to dr Heber Nash, he states Friday will be good for appt

## 2018-06-10 NOTE — Telephone Encounter (Signed)
Agree, as noted, main medication change was Metoprolol tartrate to metoprolol succinate, this should not cause hallucnations, he needs to be evaluated in clinic.  Friday should be fine.

## 2018-06-10 NOTE — Telephone Encounter (Signed)
Transition Care Management Follow-up Telephone Call   Date discharged?"friday, I dont know that date" 6/7   How have you been since you were released from the hospital? "alright"   Do you understand why you were in the hospital? "yes, my heart was beating right"   Do you understand the discharge instructions? yes   Where were you discharged to? home   Items Reviewed:  Medications reviewed: yes, im having a problem with my medicine, they increased one of them and last night it started making me hallucinate and I hear voices, it scares my wife" at this time appt was made at pt's desire which is Friday 6/14 at [facility], tomorrow he will be at dialysis and he is ask to make the Dr, PA or NP aware of this also he is ask to call 911 or come to ED if this happens again, states he will not stop the medicine, informed pt I cannot advise him to continue or stop but will make Dr. aware  Allergies reviewed: yes  Dietary changes reviewed: yes  Referrals reviewed: yes   Functional Questionnaire:   Activities of Daily Living (ADLs):   He states they are independent in the following: all States they require assistance with the following: driving   Any transportation issues/concerns?: no   Any patient concerns? Discussed above   Confirmed importance and date/time of follow-up visits scheduled yes  [facility] Friday 6/14 at 72  Confirmed with patient if condition begins to worsen call PCP or go to the ER.  Patient was given the office number and encouraged to call back with question or concerns.  : yes

## 2018-06-12 ENCOUNTER — Ambulatory Visit (INDEPENDENT_AMBULATORY_CARE_PROVIDER_SITE_OTHER): Payer: Medicare Other | Admitting: Internal Medicine

## 2018-06-12 ENCOUNTER — Ambulatory Visit: Payer: Medicare Other | Admitting: Physical Therapy

## 2018-06-12 VITALS — BP 125/80 | HR 81

## 2018-06-12 DIAGNOSIS — G478 Other sleep disorders: Secondary | ICD-10-CM

## 2018-06-12 DIAGNOSIS — R293 Abnormal posture: Secondary | ICD-10-CM

## 2018-06-12 DIAGNOSIS — R2689 Other abnormalities of gait and mobility: Secondary | ICD-10-CM

## 2018-06-12 DIAGNOSIS — Z9181 History of falling: Secondary | ICD-10-CM

## 2018-06-12 DIAGNOSIS — M6281 Muscle weakness (generalized): Secondary | ICD-10-CM

## 2018-06-12 DIAGNOSIS — R2681 Unsteadiness on feet: Secondary | ICD-10-CM

## 2018-06-12 NOTE — Progress Notes (Signed)
   CC: haullcinations  HPI:  [name] is a 52 y.o. male with a past medical history listed below here today with complaints of hallucinations  For details of today's visit and the status of his chronic medical issues please refer to the assessment and plan.   Past Medical History:  Diagnosis Date  . Anemia   . Chronic combined systolic and diastolic CHF (congestive heart failure) ([place])   . Diabetes ([place])   . Diabetic foot ulcer ([place]) 09/03/2017  . 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 ESRD (end stage renal disease) [facility])    [facility] [address] T, Th, Sat  . HIV disease ([place])   . Hyperparathyroidism, secondary ([place])   . Hypertension   . Hypertensive heart disease with end stage renal disease on dialysis ([place]) 03/13/2017  . Subacute osteomyelitis, right ankle and foot ([place])   . SVT (supraventricular tachycardia) ([place])    ? afib or atrial flutter s/p TEE/DCCV with subsequent ablation due to reoccurrence in [place]  . Type 2 diabetes mellitus (HCC)    Review of Systems:   No chest pain or shortness of breath  Physical Exam:  Vitals:   06/12/18 1521  BP: (!) 145/84  Pulse: (!) 50  Temp: 98.5 F (36.9 C)  TempSrc: Oral  SpO2: 100%  Weight: 290 lb 6.4 oz (131.7 kg)   GENERAL- alert, co-operative, appears as stated age, not in any distress. HEENT- Atraumatic, normocephalic CARDIAC- RRR, no murmurs, rubs or gallops. RESP- Clear to auscultation bilaterally, no wheezes or crackles. PSYCH- Normal mood and affect, appropriate thought content and speech.     Assessment & Plan:   See Encounters Tab for problem based charting.  Patient discussed with Dr. Daryll Drown

## 2018-06-12 NOTE — Patient Instructions (Addendum)
Thomas [name],  We will get you set up to see a sleep specialist to have this further evaluated. I do not think it is anything dangerous to be overly concerned about.   Please schedule a follow up appointment with your regular doctor in the next 2-3 months.

## 2018-06-12 NOTE — Therapy (Signed)
[facility] [address] [place], [place], [address] Phone: [phone]   Fax:  [phone]  Physical Therapy Treatment  Patient Details  Name: Thomas [name] MRN: [id number] Date of Birth: 06-10-1966 Referring Provider: Meridee Score, MD   Encounter Date: 06/12/2018  PT End of Session - 06/12/18 1307    Visit Number  5    Number of Visits  26    Date for PT Re-Evaluation  08/14/18    Authorization Type  Medicare & Generic commercial    PT Start Time  1145    PT Stop Time  1230    PT Time Calculation (min)  45 min    Equipment Utilized During Treatment  Gait belt    Activity Tolerance  Patient tolerated treatment well;Patient limited by fatigue    Behavior During Therapy  [facility] for tasks assessed/performed       Past Medical History:  Diagnosis Date  . Anemia   . Chronic combined systolic and diastolic CHF (congestive heart failure) ([place])   . Diabetes ([place])   . Diabetic foot ulcer ([place]) 09/03/2017  . Diabetic wet gangrene of the foot ([place]) 09/04/2017  . ESRD (end stage renal disease) [facility])    [facility] [address] T, Th, Sat  . HIV disease ([place])   . Hyperparathyroidism, secondary ([place])   . Hypertension   . Hypertensive heart disease with end stage renal disease on dialysis ([place]) 03/13/2017  . Subacute osteomyelitis, right ankle and foot ([place])   . SVT (supraventricular tachycardia) ([place])    ? afib or atrial flutter s/p TEE/DCCV with subsequent ablation due to reoccurrence in [place]  . Type 2 diabetes mellitus ([place])     Past Surgical History:  Procedure Laterality Date  . AMPUTATION Left    foot  . AMPUTATION Right 12/06/2017   Procedure: AMPUTATION BELOW KNEE;  Surgeon: [name], MD;  Location: [place];  Service: Orthopedics;  Laterality: Right;  . APPLICATION OF WOUND VAC Right 09/04/2017   Procedure: APPLICATION OF WOUND VAC;  Surgeon: [name], DPM;  Location: [place];  Service: Podiatry;  Laterality: Right;   . APPLICATION OF WOUND VAC  12/06/2017   Procedure: APPLICATION OF WOUND VAC;  Surgeon: [name], MD;  Location: [place];  Service: Orthopedics;;  . COLONOSCOPY WITH PROPOFOL N/A 07/30/2017   Procedure: COLONOSCOPY WITH PROPOFOL;  Surgeon: [name], MD;  Location: [place];  Service: Endoscopy;  Laterality: N/A;  . ESOPHAGOGASTRODUODENOSCOPY (EGD) WITH PROPOFOL N/A 07/28/2017   Procedure: ESOPHAGOGASTRODUODENOSCOPY (EGD) WITH PROPOFOL;  Surgeon: [name], MD;  Location: [place];  Service: Endoscopy;  Laterality: N/A;  . ESOPHAGOGASTRODUODENOSCOPY (EGD) WITH PROPOFOL N/A 01/15/2018   Procedure: ESOPHAGOGASTRODUODENOSCOPY (EGD) WITH PROPOFOL;  Surgeon: [name], MD;  Location[facility] ENDOSCOPY;  Service: Endoscopy;  Laterality: N/A;  . FLEXIBLE SIGMOIDOSCOPY N/A 07/27/2017   Procedure: FLEXIBLE SIGMOIDOSCOPY;  Surgeon: [name], MD;  Location: [facility] ENDOSCOPY;  Service: Endoscopy;  Laterality: N/A;  . GRAFT APPLICATION Right 18/84/1660   Procedure: SKIN GRAFT APPLICATION RIGHT FOOT;  Surgeon: [name], DPM;  Location: [facility];  Service: Podiatry;  Laterality: Right;  . I&D EXTREMITY Right 09/04/2017   Procedure: IRRIGATION AND DEBRIDEMENT EXTREMITY;  Surgeon: [name], DPM;  Location: [place];  Service: Podiatry;  Laterality: Right;  . I&D EXTREMITY Right 11/12/2017   Procedure: IRRIGATION AND DEBRIDEMENT ULCER RIGHT FOOT;  Surgeon: [name], DPM;  Location: [place];  Service: Podiatry;  Laterality:  Right;  Marland Kitchen RIGHT AV FISTULA PLACEMENT  10/19/2012  . WOUND DEBRIDEMENT N/A 09/24/2017   Procedure: DEBRIDEMENT WOUND;  Surgeon: [name], DPM;  Location: [place];  Service: Podiatry;  Laterality: N/A;    Vitals:   06/12/18 1157  BP: 125/80  Pulse: 81    Subjective Assessment - 06/12/18 1158    Subjective  Was in the hospital for Afib; some medications adjusted but he has an appointment this afternoon because one medication is making him  hallucinate.  Wore through tennis balls on RW.    Pertinent History  R TTA, ESRD, DM2, HIV, cardiomyopathy, CHF, Tachycardia, A-Fib, Mallory-Weiss Tear, obesity    Limitations  Standing;Walking;House hold activities    Patient Stated Goals  To use bilateral prostheses to dance with wife & swimming,     Currently in Pain?  No/denies                       [facility] Adult PT Treatment/Exercise - 06/12/18 1253      Transfers   Transfers  Sit to Stand;Stand to Sit    Sit to Stand  6: Modified independent (Device/Increase time)    Stand to Sit  6: Modified independent (Device/Increase time)      Ambulation/Gait   Ambulation/Gait  Yes    Ambulation/Gait Assistance  5: Supervision;4: Min guard    Ambulation/Gait Assistance Details  with cane, as pt fatigued he required min A to regain balance.  Also performed stepping over 4 low obstacles x 2 reps + stepping over taller obstacles (noodle and low hurdle) with cane and min-mod A for balance with pt demonstrating improved balance when stepping over with L foot first    Ambulation Distance (Feet)  230 Feet    Assistive device  Straight cane    Gait Pattern  Step-through pattern;Trunk flexed    Ambulation Surface  Level;Indoor    Stairs  Yes    Stairs Assistance  4: Min guard    Stairs Assistance Details (indicate cue type and reason)  with cane and one rail; no physical assistance provided but verbal cues required for sequence and to bring toe over edge of step when descending    Stair Management Technique  One rail Right;Step to pattern;Forwards;With cane    Number of Stairs  4    Height of Stairs  6    Ramp  3: Mod assist    Ramp Details (indicate cue type and reason)  x 1 with cane with therapist providing tactile cues for more upright trunk and control of forward momentum with descending    Curb  Not tested (comment) unable with cane and one person assist    Curb Details (indicate cue type and reason)  Unable to safely descend with  cane and one person assist due to pt requiring bilat UE support to perform.      Pre-Gait Activities  Continued curb training beginning with step downs from 4" step and then changing to 2" step due to pt continuing to grab parallel bar for support.  On step downs with 2" step pt continued to require hands on assistance for stability and balance with one UE support on cane; pt demonstrates insufficient proximal hip strength and core strength to maintain single limb stance when descending.      High Level Balance   High Level Balance Activities  Marching forwards;Other (comment)    High Level Balance Comments  Reaching forwards x 12" and reaching all the  way to floor with one UE support on cane and supervision from therapist             PT Education - 06/12/18 1305    Education provided  Yes    Education Details  stepping over obstacles, curb and stair negotiation with cane.  Pt's goal was to transition to cane by Father's day.  Discussed safety and environments when pt would still require RW (longer distances, compliant surfaces, uneven surfaces where pt may need to ascend/descend).      Person(s) Educated  Patient    Methods  Explanation    Comprehension  Verbalized understanding       PT Short Term Goals - 06/12/18 1202      PT SHORT TERM GOAL #1   Title  Patient verbalizes proper limb care for sweat management. (All STGs Target Date: 06/17/2018)    Time  1    Period  Months    Status  On-going      PT SHORT TERM GOAL #2   Title  Patient verbalizes options for pool with & without prostheses to address his goal.     Time  1    Period  Months    Status  On-going      PT SHORT TERM GOAL #3   Title  Patient performs standing balance with cane support reaching 10" & to floor with supervision.     Baseline  Can reach all the way to the floor and out 12" with support on cane and supervision    Time  1    Period  Months    Status  Achieved      PT SHORT TERM GOAL #4   Title   Patient ambulates 200' with cane & prostheses with supervision.     Baseline  230' with cane and prostheses with supervision until pt fatigued and required min A to regain balance at end of walk, 89%, 93 bpm after two laps    Time  1    Period  Months    Status  Partially Met      PT SHORT TERM GOAL #5   Title  Patient negotiates stairs with 1 rails(to exit/enter friend's house), curbs & ramps with cane & prostheses with minA.     Baseline  stairs with min A with cane; ramp mod A, curb unable with cane as only support    Time  1    Period  Months    Status  Partially Met      PT SHORT TERM GOAL #6   Title  Pt will improve endurance to be able to complete 6 min walk test with 2 standing rest breaks and more upright trunk posture.    Baseline  Completed 4 minutes, 2-3 standing rest breaks    Time  1    Period  Months    Status  Partially Met        PT Long Term Goals - 05/20/18 1513      PT LONG TERM GOAL #1   Title  Patient verbalizes & demonstrates proper prosthetic care including prostheses with dialysis to enable safe use of bilateral Transtibial prostheses. (All LTGs Target Date: 08/14/2018)    Time  3    Period  Months    Status  New      PT LONG TERM GOAL #2   Title  Patient tolerates wear of bilateral Transtibial prostheses >90% of awake hours without skin issues or limb pain to  enable function during most of his day.     Time  3    Period  Months    Status  New      PT LONG TERM GOAL #3   Title  Standing balance with cane or less support reaching 10" anteriorly, to floor, scanning & managing clothes modified independent.     Time  3    Period  Months    Status  New      PT LONG TERM GOAL #4   Title  Patient ambulates 500' outdoors including grass with cane or less & bilateral prostheses modified independent for community mobility.     Time  3    Period  Months    Status  New      PT LONG TERM GOAL #5   Title  Patient ambulates100'  around furniture carrying  plate or cup with cane or less & prostheses modified independent for household mobility.     Time  3    Period  Months    Status  New      Additional Long Term Goals   Additional Long Term Goals  Yes      PT LONG TERM GOAL #6   Title  Patient negotiates ramps, curbs & stairs (1 rail) with cane or less & prostheses modified independent for community access.     Time  3    Period  Months    Status  New      PT LONG TERM GOAL #7   Title  Pt will improve endurance during gait by increasing distance on 6 minute walk test by 150' with LRAD    Baseline  451 in 4 minutes; will re-assess at 4 weeks    Time  3    Period  Months    Status  New    Target Date  08/14/18            Plan - 06/12/18 1308    Clinical Impression Statement  Due to recent hospitalization performed re-assessment of function and mobility in conjunction with assessment of progress towards STG.  Pt has met STG #3, partially met STG #4,5,6 and goals 1 and 2 to be assessed on Monday.  Pt has not experienced a significant decline in function or mobility following hospitalization.  Pt continues to demonstrate progress and was able to ambulate 230' with cane and negotiate 4 stairs with cane and one rail with supervision-min guard today; pt was also able maintain balance while stepping over obstacles and reaching out of BOS.  Pt demonstrates improved upright posture with gait but continues to demonstrate decreased endurance and single limb stability when negotiating curbs, uneven surfaces(ramps) or stairs with only one UE support on cane.  Pt continues to require frequent rest breaks but was able to maintain Sp02 > 90% and HR between 89-93 with activity.  Will continue to address and progress towards LTG.    Rehab Potential  Good    PT Frequency  2x / week 13 weeks (90 days)    PT Duration  Other (comment)    PT Treatment/Interventions  ADLs/Self Care Home Management;Canalith Repostioning;DME Instruction;Gait training;Stair  training;Functional mobility training;Therapeutic activities;Therapeutic exercise;Balance training;Patient/family education;Prosthetic Training;Vestibular    PT Next Visit Plan  Finish assessment of STG 1 and 2; curb negotiation training with just UE support on cane - needs to work on proximal hip strengthening.  Gait over variety of surfaces with cane.  Endurance and standing balance with decreased UE support  Consulted and Agree with Plan of Care  Patient       Patient will benefit from skilled therapeutic intervention in order to improve the following deficits and impairments:  Abnormal gait, Cardiopulmonary status limiting activity, Decreased activity tolerance, Decreased balance, Decreased endurance, Decreased mobility, Decreased knowledge of use of DME, Decreased range of motion, Decreased scar mobility, Decreased strength, Postural dysfunction, Prosthetic Dependency  Visit Diagnosis: Unsteadiness on feet  Other abnormalities of gait and mobility  Muscle weakness (generalized)  History of falling  Abnormal posture     Problem List Patient Active Problem List   Diagnosis Date Noted  . Encounter for therapeutic drug monitoring 05/27/2018  . Chronic systolic heart failure ([place])   . Demand ischemia ([place])   . Atrial fibrillation with RVR ([place]) 05/02/2018  . Murmur, cardiac 02/13/2018  . Mallory-Weiss tear 01/16/2018  . Morbid obesity ([place])   . Fall   . Subtherapeutic international normalized ratio (INR)   . History of left below knee amputation ([place]) 12/11/2017  . History of supraventricular tachycardia   . S/P bilateral BKA (below knee amputation) ([place])   . Anemia of chronic disease   . Leukocytosis   . Non-ischemic cardiomyopathy ([place])   . Hypokalemia 09/04/2017  . Essential hypertension 09/03/2017  . GERD (gastroesophageal reflux disease) 09/03/2017  . GIB (gastrointestinal bleeding) 07/26/2017  . Anemia due to end stage renal disease ([place]) 07/26/2017  . PAF  (paroxysmal atrial fibrillation) ([place]) 07/26/2017  . Symptomatic anemia 07/26/2017  . End stage renal disease ([place]) 05/05/2017  . HIV disease ([place]) 03/25/2017  . Hypertensive heart disease with end stage renal disease on dialysis ([place]) 03/13/2017  . ESRD on dialysis ([place]) 03/13/2017  . Type 2 diabetes mellitus with complication ([place]) 71/83/6725  . SVT (supraventricular tachycardia) ([place])     Rico Junker, PT, DPT 06/12/18    1:16 PM    [facility] [address] [place], [place], [address] Phone: [phone]   Fax:  [phone]  Name: Thomas [name] MRN: [id number] Date of Birth: [birth year]

## 2018-06-12 NOTE — Assessment & Plan Note (Signed)
Patient reports that his wife has reported to him that he has been keeping her up at night with him talking.  He initially called in several days ago reporting that he had hallucinations.  He states that this past Tuesday and Wednesday night he was asleep in between approximately 2 AM and 7 AM his wife noted that he would be talking in his sleep and disturbing her.  He reports that she told him he was talking to people that were not there.  Reports that he would reply in his sleep to things that she was telling him.  He does not recall any of these events.  He reports to me that his wife has told him for numerous years that he talks in sleep.  He denies acting out his dreams and sleep.  He reports that he feels well rested in the morning and does not have issues with somnolence.  He denies any visual or audio hallucinations that he can remember.  Assessment plan: Patient appears to be having nocturnal vocalizations.  There are no concerning features but he may have an underlying REM sleep disorder.  Given that this is disturbing his partner sleep however for him to have sleep evaluation.

## 2018-06-15 ENCOUNTER — Encounter (HOSPITAL_COMMUNITY): Payer: Self-pay

## 2018-06-15 ENCOUNTER — Other Ambulatory Visit: Payer: Self-pay

## 2018-06-15 ENCOUNTER — Ambulatory Visit: Payer: Medicare Other

## 2018-06-15 ENCOUNTER — Emergency Department (HOSPITAL_COMMUNITY): Payer: Medicare Other

## 2018-06-15 ENCOUNTER — Inpatient Hospital Stay (HOSPITAL_COMMUNITY)
Admission: EM | Admit: 2018-06-15 | Discharge: 2018-06-19 | DRG: 286 | Disposition: A | Discharge status: Home / Self Care | Payer: Medicare Other | Attending: Internal Medicine | Admitting: Internal Medicine

## 2018-06-15 ENCOUNTER — Ambulatory Visit: Payer: Medicare Other | Admitting: Physical Therapy

## 2018-06-15 DIAGNOSIS — E8889 Other specified metabolic disorders: Secondary | ICD-10-CM | POA: Diagnosis present

## 2018-06-15 DIAGNOSIS — I25119 Atherosclerotic heart disease of native coronary artery with unspecified angina pectoris: Secondary | ICD-10-CM | POA: Diagnosis present

## 2018-06-15 DIAGNOSIS — D638 Anemia in other chronic diseases classified elsewhere: Secondary | ICD-10-CM | POA: Diagnosis present

## 2018-06-15 DIAGNOSIS — I5041 Acute combined systolic (congestive) and diastolic (congestive) heart failure: Secondary | ICD-10-CM

## 2018-06-15 DIAGNOSIS — N2581 Secondary hyperparathyroidism of renal origin: Secondary | ICD-10-CM | POA: Diagnosis present

## 2018-06-15 DIAGNOSIS — I484 Atypical atrial flutter: Secondary | ICD-10-CM | POA: Diagnosis present

## 2018-06-15 DIAGNOSIS — E785 Hyperlipidemia, unspecified: Secondary | ICD-10-CM | POA: Diagnosis present

## 2018-06-15 DIAGNOSIS — R079 Chest pain, unspecified: Secondary | ICD-10-CM | POA: Diagnosis not present

## 2018-06-15 DIAGNOSIS — Z9181 History of falling: Secondary | ICD-10-CM

## 2018-06-15 DIAGNOSIS — N186 End stage renal disease: Secondary | ICD-10-CM | POA: Diagnosis present

## 2018-06-15 DIAGNOSIS — I248 Other forms of acute ischemic heart disease: Secondary | ICD-10-CM | POA: Diagnosis present

## 2018-06-15 DIAGNOSIS — I481 Persistent atrial fibrillation: Secondary | ICD-10-CM | POA: Diagnosis present

## 2018-06-15 DIAGNOSIS — E1122 Type 2 diabetes mellitus with diabetic chronic kidney disease: Secondary | ICD-10-CM | POA: Diagnosis present

## 2018-06-15 DIAGNOSIS — D539 Nutritional anemia, unspecified: Secondary | ICD-10-CM | POA: Diagnosis present

## 2018-06-15 DIAGNOSIS — I214 Non-ST elevation (NSTEMI) myocardial infarction: Secondary | ICD-10-CM

## 2018-06-15 DIAGNOSIS — I480 Paroxysmal atrial fibrillation: Secondary | ICD-10-CM | POA: Diagnosis present

## 2018-06-15 DIAGNOSIS — Z7901 Long term (current) use of anticoagulants: Secondary | ICD-10-CM

## 2018-06-15 DIAGNOSIS — R2681 Unsteadiness on feet: Secondary | ICD-10-CM

## 2018-06-15 DIAGNOSIS — I4589 Other specified conduction disorders: Secondary | ICD-10-CM | POA: Diagnosis present

## 2018-06-15 DIAGNOSIS — E1152 Type 2 diabetes mellitus with diabetic peripheral angiopathy with gangrene: Secondary | ICD-10-CM | POA: Diagnosis present

## 2018-06-15 DIAGNOSIS — I5043 Acute on chronic combined systolic (congestive) and diastolic (congestive) heart failure: Secondary | ICD-10-CM | POA: Diagnosis present

## 2018-06-15 DIAGNOSIS — M6281 Muscle weakness (generalized): Secondary | ICD-10-CM

## 2018-06-15 DIAGNOSIS — M25662 Stiffness of left knee, not elsewhere classified: Secondary | ICD-10-CM

## 2018-06-15 DIAGNOSIS — I132 Hypertensive heart and chronic kidney disease with heart failure and with stage 5 chronic kidney disease, or end stage renal disease: Secondary | ICD-10-CM | POA: Diagnosis present

## 2018-06-15 DIAGNOSIS — I4892 Unspecified atrial flutter: Secondary | ICD-10-CM | POA: Diagnosis present

## 2018-06-15 DIAGNOSIS — K219 Gastro-esophageal reflux disease without esophagitis: Secondary | ICD-10-CM | POA: Diagnosis present

## 2018-06-15 DIAGNOSIS — I472 Ventricular tachycardia: Secondary | ICD-10-CM | POA: Diagnosis present

## 2018-06-15 DIAGNOSIS — I4891 Unspecified atrial fibrillation: Secondary | ICD-10-CM

## 2018-06-15 DIAGNOSIS — I10 Essential (primary) hypertension: Secondary | ICD-10-CM | POA: Diagnosis present

## 2018-06-15 DIAGNOSIS — I4819 Other persistent atrial fibrillation: Secondary | ICD-10-CM | POA: Diagnosis present

## 2018-06-15 DIAGNOSIS — I200 Unstable angina: Secondary | ICD-10-CM | POA: Diagnosis not present

## 2018-06-15 DIAGNOSIS — I255 Ischemic cardiomyopathy: Secondary | ICD-10-CM | POA: Diagnosis present

## 2018-06-15 DIAGNOSIS — M25672 Stiffness of left ankle, not elsewhere classified: Secondary | ICD-10-CM

## 2018-06-15 DIAGNOSIS — R791 Abnormal coagulation profile: Secondary | ICD-10-CM | POA: Diagnosis present

## 2018-06-15 DIAGNOSIS — Z794 Long term (current) use of insulin: Secondary | ICD-10-CM | POA: Diagnosis not present

## 2018-06-15 DIAGNOSIS — D631 Anemia in chronic kidney disease: Secondary | ICD-10-CM | POA: Diagnosis present

## 2018-06-15 DIAGNOSIS — I2510 Atherosclerotic heart disease of native coronary artery without angina pectoris: Secondary | ICD-10-CM | POA: Diagnosis not present

## 2018-06-15 DIAGNOSIS — R072 Precordial pain: Secondary | ICD-10-CM

## 2018-06-15 DIAGNOSIS — Z8249 Family history of ischemic heart disease and other diseases of the circulatory system: Secondary | ICD-10-CM

## 2018-06-15 DIAGNOSIS — R293 Abnormal posture: Secondary | ICD-10-CM

## 2018-06-15 DIAGNOSIS — B20 Human immunodeficiency virus [HIV] disease: Secondary | ICD-10-CM | POA: Diagnosis present

## 2018-06-15 DIAGNOSIS — Z992 Dependence on renal dialysis: Secondary | ICD-10-CM | POA: Diagnosis not present

## 2018-06-15 DIAGNOSIS — Z89511 Acquired absence of right leg below knee: Secondary | ICD-10-CM

## 2018-06-15 DIAGNOSIS — Z89512 Acquired absence of left leg below knee: Secondary | ICD-10-CM

## 2018-06-15 DIAGNOSIS — R2689 Other abnormalities of gait and mobility: Secondary | ICD-10-CM

## 2018-06-15 DIAGNOSIS — Z79899 Other long term (current) drug therapy: Secondary | ICD-10-CM

## 2018-06-15 DIAGNOSIS — Z833 Family history of diabetes mellitus: Secondary | ICD-10-CM

## 2018-06-15 DIAGNOSIS — Z885 Allergy status to narcotic agent status: Secondary | ICD-10-CM

## 2018-06-15 DIAGNOSIS — Z6831 Body mass index (BMI) 31.0-31.9, adult: Secondary | ICD-10-CM

## 2018-06-15 HISTORY — DX: Pneumonia, unspecified organism: J18.9

## 2018-06-15 HISTORY — DX: Headache, unspecified: R51.9

## 2018-06-15 HISTORY — DX: End stage renal disease: N18.6

## 2018-06-15 HISTORY — DX: Headache: R51

## 2018-06-15 HISTORY — DX: Dependence on renal dialysis: Z99.2

## 2018-06-15 LAB — BASIC METABOLIC PANEL
Anion gap: 18 — ABNORMAL HIGH (ref 5–15)
BUN: 62 mg/dL — AB (ref 6–20)
CO2: 23 mmol/L (ref 22–32)
CREATININE: 11.63 mg/dL — AB (ref 0.61–1.24)
Calcium: 9.1 mg/dL (ref 8.9–10.3)
Chloride: 97 mmol/L — ABNORMAL LOW (ref 101–111)
GFR calc non Af Amer: 4 mL/min — ABNORMAL LOW (ref >60)
GFR, EST AFRICAN AMERICAN: 5 mL/min — AB (ref >60)
Glucose, Bld: 297 mg/dL — ABNORMAL HIGH (ref 65–99)
Potassium: 4.1 mmol/L (ref 3.5–5.1)
SODIUM: 138 mmol/L (ref 135–145)

## 2018-06-15 LAB — CBC
HCT: 31.3 % — ABNORMAL LOW (ref 39.0–52.0)
HEMOGLOBIN: 10.3 g/dL — AB (ref 13.0–17.0)
MCH: 34.1 pg — ABNORMAL HIGH (ref 26.0–34.0)
MCHC: 32.9 g/dL (ref 30.0–36.0)
MCV: 103.6 fL — AB (ref 78.0–100.0)
Platelets: 219 10*3/uL (ref 150–400)
RBC: 3.02 MIL/uL — AB (ref 4.22–5.81)
RDW: 15.8 % — ABNORMAL HIGH (ref 11.5–15.5)
WBC: 8.9 10*3/uL (ref 4.0–10.5)

## 2018-06-15 LAB — I-STAT TROPONIN, ED: TROPONIN I, POC: 0.74 ng/mL — AB (ref 0.00–0.08)

## 2018-06-15 LAB — PROTIME-INR
INR: 2.29
Prothrombin Time: 25 seconds — ABNORMAL HIGH (ref 11.4–15.2)

## 2018-06-15 LAB — GLUCOSE, CAPILLARY: Glucose-Capillary: 273 mg/dL — ABNORMAL HIGH (ref 65–99)

## 2018-06-15 LAB — TROPONIN I: Troponin I: 1.27 ng/mL (ref <0.03)

## 2018-06-15 MED ORDER — WARFARIN SODIUM 7.5 MG PO TABS
7.5000 mg | ORAL_TABLET | Freq: Once | ORAL | Status: AC
  Administered 2018-06-15: 7.5 mg via ORAL
  Filled 2018-06-15 (×2): qty 1

## 2018-06-15 MED ORDER — DILTIAZEM LOAD VIA INFUSION
15.0000 mg | Freq: Once | INTRAVENOUS | Status: AC
  Administered 2018-06-15: 15 mg via INTRAVENOUS
  Filled 2018-06-15 (×2): qty 15

## 2018-06-15 MED ORDER — AMIODARONE HCL IN DEXTROSE 360-4.14 MG/200ML-% IV SOLN
60.0000 mg/h | INTRAVENOUS | Status: DC
  Administered 2018-06-15: 60 mg/h via INTRAVENOUS
  Filled 2018-06-15: qty 200

## 2018-06-15 MED ORDER — INSULIN ASPART 100 UNIT/ML ~~LOC~~ SOLN
0.0000 [IU] | Freq: Three times a day (TID) | SUBCUTANEOUS | Status: DC
  Administered 2018-06-16: 5 [IU] via SUBCUTANEOUS
  Administered 2018-06-16: 3 [IU] via SUBCUTANEOUS
  Administered 2018-06-16 – 2018-06-17 (×2): 5 [IU] via SUBCUTANEOUS
  Administered 2018-06-17: 3 [IU] via SUBCUTANEOUS
  Administered 2018-06-17: 8 [IU] via SUBCUTANEOUS
  Administered 2018-06-18: 5 [IU] via SUBCUTANEOUS
  Administered 2018-06-18: 3 [IU] via SUBCUTANEOUS
  Administered 2018-06-19: 5 [IU] via SUBCUTANEOUS
  Administered 2018-06-19: 11 [IU] via SUBCUTANEOUS

## 2018-06-15 MED ORDER — AMIODARONE HCL IN DEXTROSE 360-4.14 MG/200ML-% IV SOLN
30.0000 mg/h | INTRAVENOUS | Status: DC
  Administered 2018-06-16: 30 mg/h via INTRAVENOUS
  Filled 2018-06-15: qty 200

## 2018-06-15 MED ORDER — ACETAMINOPHEN 500 MG PO TABS
1000.0000 mg | ORAL_TABLET | Freq: Once | ORAL | Status: AC
  Administered 2018-06-15: 1000 mg via ORAL
  Filled 2018-06-15: qty 2

## 2018-06-15 MED ORDER — WARFARIN - PHARMACIST DOSING INPATIENT
Freq: Every day | Status: DC
  Administered 2018-06-15: 19:00:00

## 2018-06-15 MED ORDER — BICTEGRAVIR-EMTRICITAB-TENOFOV 50-200-25 MG PO TABS
1.0000 | ORAL_TABLET | Freq: Every day | ORAL | Status: DC
  Administered 2018-06-16 – 2018-06-19 (×4): 1 via ORAL
  Filled 2018-06-15 (×4): qty 1

## 2018-06-15 MED ORDER — CAMPHOR-MENTHOL 0.5-0.5 % EX LOTN
TOPICAL_LOTION | CUTANEOUS | Status: DC | PRN
  Filled 2018-06-15: qty 222

## 2018-06-15 MED ORDER — ALTEPLASE 2 MG IJ SOLR: 2.0000 mg | Freq: Once | INTRAMUSCULAR | Status: DC | PRN

## 2018-06-15 MED ORDER — LIDOCAINE-PRILOCAINE 2.5-2.5 % EX CREA: 1.0000 "application " | TOPICAL_CREAM | CUTANEOUS | Status: DC | PRN

## 2018-06-15 MED ORDER — LIDOCAINE HCL (PF) 1 % IJ SOLN: 5.0000 mL | INTRAMUSCULAR | Status: DC | PRN

## 2018-06-15 MED ORDER — SEVELAMER CARBONATE 800 MG PO TABS
800.0000 mg | ORAL_TABLET | Freq: Three times a day (TID) | ORAL | Status: DC
  Filled 2018-06-15: qty 1

## 2018-06-15 MED ORDER — SODIUM CHLORIDE 0.9 % IV SOLN: 100.0000 mL | INTRAVENOUS | Status: DC | PRN

## 2018-06-15 MED ORDER — DILTIAZEM HCL 100 MG IV SOLR
5.0000 mg/h | INTRAVENOUS | Status: DC
  Administered 2018-06-15: 5 mg/h via INTRAVENOUS
  Administered 2018-06-15: 15 mg/h via INTRAVENOUS
  Administered 2018-06-16: 5 mg/h via INTRAVENOUS
  Administered 2018-06-16: 15 mg/h via INTRAVENOUS
  Filled 2018-06-15 (×7): qty 100

## 2018-06-15 MED ORDER — CINACALCET HCL 30 MG PO TABS
90.0000 mg | ORAL_TABLET | Freq: Every day | ORAL | Status: DC
  Administered 2018-06-16 – 2018-06-19 (×4): 90 mg via ORAL
  Filled 2018-06-15 (×5): qty 3

## 2018-06-15 MED ORDER — ENSURE ENLIVE PO LIQD
237.0000 mL | Freq: Two times a day (BID) | ORAL | Status: DC
  Administered 2018-06-17 – 2018-06-19 (×2): 237 mL via ORAL

## 2018-06-15 MED ORDER — HEPARIN SODIUM (PORCINE) 1000 UNIT/ML DIALYSIS
1000.0000 [IU] | INTRAMUSCULAR | Status: DC | PRN
  Filled 2018-06-15: qty 1

## 2018-06-15 MED ORDER — PANTOPRAZOLE SODIUM 40 MG PO TBEC
80.0000 mg | DELAYED_RELEASE_TABLET | Freq: Every day | ORAL | Status: DC
  Administered 2018-06-16 – 2018-06-19 (×4): 80 mg via ORAL
  Filled 2018-06-15 (×4): qty 2

## 2018-06-15 MED ORDER — INSULIN GLARGINE 100 UNIT/ML ~~LOC~~ SOLN
10.0000 [IU] | Freq: Every day | SUBCUTANEOUS | Status: DC
  Administered 2018-06-15 – 2018-06-16 (×2): 10 [IU] via SUBCUTANEOUS
  Filled 2018-06-15 (×2): qty 0.1

## 2018-06-15 MED ORDER — GABAPENTIN 100 MG PO CAPS
100.0000 mg | ORAL_CAPSULE | ORAL | Status: DC
  Administered 2018-06-16 – 2018-06-18 (×2): 100 mg via ORAL
  Filled 2018-06-15 (×2): qty 1

## 2018-06-15 MED ORDER — PENTAFLUOROPROP-TETRAFLUOROETH EX AERO: 1.0000 "application " | INHALATION_SPRAY | CUTANEOUS | Status: DC | PRN

## 2018-06-15 NOTE — Progress Notes (Signed)
ANTICOAGULATION CONSULT NOTE - Initial Consult  Pharmacy Consult for Warfarin Indication: atrial fibrillation  Allergies  Allergen Reactions  . Oxycodone-Acetaminophen Other (See Comments)    Hallucinations/delirum    Patient Measurements:   Heparin Dosing Weight:   Vital Signs: BP: 120/79 (06/17 1700) Pulse Rate: 100 (06/17 1700)  Labs: Recent Labs    06/15/18 1216  HGB 10.3*  HCT 31.3*  PLT 219  LABPROT 25.0*  INR 2.29  CREATININE 11.63*    Estimated Creatinine Clearance: 11.3 mL/min (A) (by C-G formula based on SCr of 11.63 mg/dL (H)).   Medical History: Past Medical History:  Diagnosis Date  . Anemia   . Chest pain with moderate risk for cardiac etiology 06/15/2018  . Chronic combined systolic and diastolic CHF (congestive heart failure) ([place])   . Diabetes ([place])   . Diabetic foot ulcer ([place]) 09/03/2017  . Diabetic wet gangrene of the foot ([place]) 09/04/2017  . ESRD (end stage renal disease) [facility])    [facility] [address] T, Th, Sat  . HIV disease ([place])   . Hyperparathyroidism, secondary ([place])   . Hypertension   . Hypertensive heart disease with end stage renal disease on dialysis ([place]) 03/13/2017  . Subacute osteomyelitis, right ankle and foot ([place])   . SVT (supraventricular tachycardia) ([place])    ? afib or atrial flutter s/p TEE/DCCV with subsequent ablation due to reoccurrence in [place]  . Type 2 diabetes mellitus ([facility])     Medications:  Scheduled:    Assessment: Patient came to ED with chest pain and Afib with RVR. Pharmacy consulted to dose PTA warfarin. Last dose yesterday 6/17. INR therapeutic at 2.29. CBC stable.  No bleeding reported. Patient supposed to start amiodarone this admission.   CHA2DS2-VASc = 3 (HTN, DM, CHF)  PTA warfarin: 7.5 mg daily except for Sundy takes 3.75 mg.    Goal of Therapy:  INR 2-3 Monitor platelets by anticoagulation protocol: Yes   Plan:  Warfarin 7.5 mg po x 1 Daily INR and CBC Monitor for s/sx of bleeding Follow-up  on amiodarone initation and adjust warfarin dose accordingly.   Leroy Libman, PharmD Pharmacy Resident

## 2018-06-15 NOTE — Consult Note (Addendum)
Cardiology Consultation:   Patient ID: Thomas [name]; [id number]; 21-Jun-1966   Admit date: 06/15/2018 Date of Consult: 06/15/2018  Primary Care Provider: Patient, No Pcp Per Primary Cardiologist: Dr [name], 03/12/2017 (seen in-hospital by Dr Angelena Form 11/2017) Primary Electrophysiologist:  n/a   Patient Profile:   Thomas [name] is a 52 y.o. male with a hx of HIV, DM, ESRD on HD, HTN, hx atrial fib on coumadin, biilat BKA, secondary hyperparathyroidism, anemia, chronic combined CHF, who is being seen today for the evaluation of atrial fib and chest pain at the request of Dr Ralene Bathe.  History of Present Illness:   Thomas [name] was admitted 05/04-05/05 for rapid atrial fib, metoprolol increased. Admitted 06/06-06/07 for chest pain, bilateral hand tingling and rapid atrial fib that started during HD. He was on metoprolol XL 100 mg qd at d/c, has appt w/ cards 06/19.  Pt did fine after d/c until yesterday. He was sitting on his porch and had sudden onset of chest pain. It was like the previous CP 06/06 but not as bad. It reached 8/10, stabbing pain. Did not go through to his back. He felt SOB and describes orthopnea and PND. He could not lie down because of his breathing, but pain did not change w/ deep inspiration. He was diaphoretic but not nauseated. He did not take anything for the pain. No way to check HR or BP at home.   Other than 06/06, never had CP before. He has not missed HD, takes the metoprolol afterwards on HD days. Had HD Saturday as scheduled.   Not able to sleep last night. This am, went to PT and his HR was >150>>sent to ER.   In the ER, he is on IV Cardizem at 7.5 mg/hr and HR is in the 130s. CP is down to a 7/10. Cardizem increased to 10 mg/hr.   When he is at home, he is doing well. He is practicing walking and is able to go > 1 block without stopping, and walk around in stores without problems. Still getting used to the prostheses, but no CP or new SOB w/ exertion.    Past  Medical History:  Diagnosis Date  . Anemia   . Chest pain with moderate risk for cardiac etiology 06/15/2018  . Chronic combined systolic and diastolic CHF (congestive heart failure) ([place])   . Diabetes ([place])   . Diabetic foot ulcer ([place]) 09/03/2017  . 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 ESRD (end stage renal disease) [facility])    [place] [address] T, Th, Sat  . HIV disease ([place])   . Hyperparathyroidism, secondary ([place])   . Hypertension   . Hypertensive heart disease with end stage renal disease on dialysis ([place]) 03/13/2017  . Subacute osteomyelitis, right ankle and foot ([place])   . SVT (supraventricular tachycardia) ([place])    ? afib or atrial flutter s/p TEE/DCCV with subsequent ablation due to reoccurrence in [place]  . Type 2 diabetes mellitus ([place])     Past Surgical History:  Procedure Laterality Date  . AMPUTATION Left    foot  . AMPUTATION Right 12/06/2017   Procedure: AMPUTATION BELOW KNEE;  Surgeon: [name], MD;  Location: [place];  Service: Orthopedics;  Laterality: Right;  . APPLICATION OF WOUND VAC Right 09/04/2017   Procedure: APPLICATION OF WOUND VAC;  Surgeon: [name], DPM;  Location: [place];  Service: Podiatry;  Laterality: Right;  . APPLICATION OF WOUND VAC  12/06/2017   Procedure: APPLICATION OF  WOUND VAC;  Surgeon: [name], MD;  Location: [place];  Service: Orthopedics;;  . COLONOSCOPY WITH PROPOFOL N/A 07/30/2017   Procedure: COLONOSCOPY WITH PROPOFOL;  Surgeon: [name], MD;  Location: [place];  Service: Endoscopy;  Laterality: N/A;  . ESOPHAGOGASTRODUODENOSCOPY (EGD) WITH PROPOFOL N/A 07/28/2017   Procedure: ESOPHAGOGASTRODUODENOSCOPY (EGD) WITH PROPOFOL;  Surgeon: [name], MD;  Location: [place];  Service: Endoscopy;  Laterality: N/A;  . ESOPHAGOGASTRODUODENOSCOPY (EGD) WITH PROPOFOL N/A 01/15/2018   Procedure: ESOPHAGOGASTRODUODENOSCOPY (EGD) WITH PROPOFOL;  Surgeon: [name], MD;  Location: [facility] ENDOSCOPY;  Service:  Endoscopy;  Laterality: N/A;  . FLEXIBLE SIGMOIDOSCOPY N/A 07/27/2017   Procedure: FLEXIBLE SIGMOIDOSCOPY;  Surgeon: [name], MD;  Location: [facility] ENDOSCOPY;  Service: Endoscopy;  Laterality: N/A;  . GRAFT APPLICATION Right 86/76/1950   Procedure: SKIN GRAFT APPLICATION RIGHT FOOT;  Surgeon: [name], DPM;  Location: [place];  Service: Podiatry;  Laterality: Right;  . I&D EXTREMITY Right 09/04/2017   Procedure: IRRIGATION AND DEBRIDEMENT EXTREMITY;  Surgeon: [name], DPM;  Location: [place];  Service: Podiatry;  Laterality: Right;  . I&D EXTREMITY Right 11/12/2017   Procedure: IRRIGATION AND DEBRIDEMENT ULCER RIGHT FOOT;  Surgeon: [name], DPM;  Location: [place];  Service: Podiatry;  Laterality: Right;  . RIGHT AV FISTULA PLACEMENT  10/19/2012  . WOUND DEBRIDEMENT N/A 09/24/2017   Procedure: DEBRIDEMENT WOUND;  Surgeon: [name], DPM;  Location: [place];  Service: Podiatry;  Laterality: N/A;     Prior to Admission medications   Medication Sig Start Date End Date Taking? Authorizing Provider  bictegravir-emtricitabine-tenofovir AF (BIKTARVY) 50-200-25 MG TABS tablet Take 1 tablet by mouth daily. 03/02/18   Carlyle Basques, MD  camphor-menthol [facility]) lotion Apply topically as needed for itching. 12/11/17   Colbert Ewing, MD  cinacalcet (SENSIPAR) 90 MG tablet Take 90 mg by mouth daily. 04/27/18   [provider]  ferric gluconate 125 mg in sodium chloride 0.9 % 100 mL Inject 125 mg into the vein Every Tuesday,Thursday,and Saturday with dialysis. 05/10/17   Hosie Poisson, MD  gabapentin (NEURONTIN) 100 MG capsule Take 1 capsule (100 mg total) by mouth every dialysis. 01/09/18   Jamse Arn, MD  insulin glargine (LANTUS) 100 unit/mL SOPN Inject 0.3 mLs (30 Units total) into the skin at bedtime. Patient taking differently: Inject 20 Units into the skin at bedtime.  12/19/17   Angiulli, Lavon Paganini, PA-C  metoprolol succinate (TOPROL XL) 100 MG 24 hr tablet Take  1 tablet (100 mg total) by mouth daily. Take with or immediately following a meal. 06/05/18 06/05/19  Hoffman, Janett Billow Ratliff, DO  pantoprazole (PROTONIX) 40 MG tablet Take 2 tablets (80 mg total) by mouth daily. 02/16/18   Alphonzo Grieve, MD  polycarbophil (FIBERCON) 625 MG tablet Take 1 tablet (625 mg total) by mouth daily. 12/19/17   Angiulli, Lavon Paganini, PA-C  sevelamer carbonate (RENVELA) 800 MG tablet Take 800 mg by mouth 3 (three) times daily with meals.  01/10/18   [provider]  warfarin (COUMADIN) 7.5 MG tablet Take 1 tablet daily except 1/2 tablet on Sundays or as directed by Anticoagulation Clinic 05/27/18   Sueanne Margarita, MD    Inpatient Medications: Scheduled Meds:  Continuous Infusions: . diltiazem (CARDIZEM) infusion 10 mg/hr (06/15/18 1619)   PRN Meds:  Allergies:    Allergies  Allergen Reactions  . Oxycodone-Acetaminophen Other (See Comments)    Hallucinations/delirum    Social History:   Social History  Socioeconomic History  . Marital status: Married    Spouse name: kim  . Number of children: 3  . Years of education: college  . Highest education level: Not on file  Occupational History  . Occupation: disabled  Social Needs  . Financial resource strain: Not on file  . Food insecurity:    Worry: Not on file    Inability: Not on file  . Transportation needs:    Medical: Not on file    Non-medical: Not on file  Tobacco Use  . Smoking status: Never Smoker  . Smokeless tobacco: Never Used  Substance and Sexual Activity  . Alcohol use: No  . Drug use: No  . Sexual activity: Not on file  Lifestyle  . Physical activity:    Days per week: Not on file    Minutes per session: Not on file  . Stress: Not on file  Relationships  . Social connections:    Talks on phone: Not on file    Gets together: Not on file    Attends religious service: Not on file    Active member of club or organization: Not on file    Attends meetings of clubs or  organizations: Not on file    Relationship status: Not on file  . Intimate partner violence:    Fear of current or ex partner: Not on file    Emotionally abused: Not on file    Physically abused: Not on file    Forced sexual activity: Not on file  Other Topics Concern  . Not on file  Social History Narrative   Lives in [place] with wife.    Family History:   Family History  Problem Relation Age of Onset  . Heart disease Mother   . Diabetes Mother   . Heart disease Father   . Cancer Neg Hx    Family Status:  Family Status  Relation Name Status  . Mother  Deceased  . Father  Deceased  . Sister  Alive  . Brother  Alive  . MGM  Deceased  . MGF  Deceased  . PGM  Deceased  . PGF  Deceased  . Sister  Alive  . Sister  Alive  . Neg Hx  (Not Specified)    ROS:  Please see the history of present illness.  All other ROS reviewed and negative.     Physical Exam/Data:   Vitals:   06/15/18 1445 06/15/18 1500 06/15/18 1600 06/15/18 1611  BP:  116/69 102/82   Pulse: 68 88 (!) 137 (!) 118  Resp: 20 14 18 16   SpO2: 98% 97% 97% 99%   No intake or output data in the 24 hours ending 06/15/18 1628  General:  Well nourished, well developed, in no acute distress HEENT: normal for age Lymph: no adenopathy Neck: minimal JVD seen Endocrine:  No thryomegaly Vascular: No carotid bruits; 4/4 extremity pulses 2+, without bruits  Cardiac:  normal S1, S2; Irreg R&R, no sig M/R/G Lungs:  clear to auscultation bilaterally, no wheezing, rhonchi or rales  Abd: soft, nontender, no hepatomegaly  Ext: no edema, has bilateral prostheses in place Musculoskeletal:  No deformities, BUE and BLE strength normal and equal Skin: warm and dry  Neuro:  CNs 2-12 intact, no focal abnormalities noted Psych:  Normal affect   EKG:  The EKG was personally reviewed and demonstrates:  06/17, atyp atrial flutter w/ 2:1 conduction, HR 154 Telemetry:  Telemetry was personally reviewed and demonstrates:  Coarse Atrial fib  vs atyp flutter, RVR, HR 130s.   Relevant CV Studies:  ECHO: 05/07/2017 - Left ventricle: The cavity size was moderately dilated. Wall   thickness was increased in a pattern of moderate LVH. Systolic   function was moderately reduced. The estimated ejection fraction   was in the range of 35% to 40%. Akinesis of the inferior   myocardium. Features are consistent with a pseudonormal left   ventricular filling pattern, with concomitant abnormal relaxation   and increased filling pressure (grade 2 diastolic dysfunction). - Aortic valve: There was mild regurgitation. - Mitral valve: There was mild regurgitation. - Left atrium: The atrium was severely dilated. - Right atrium: The atrium was mildly dilated.  Laboratory Data:  Chemistry Recent Labs  Lab 06/15/18 1216  NA 138  K 4.1  CL 97*  CO2 23  GLUCOSE 297*  BUN 62*  CREATININE 11.63*  CALCIUM 9.1  GFRNONAA 4*  GFRAA 5*  ANIONGAP 18*    Lab Results  Component Value Date   ALT 18 06/04/2018   AST 22 06/04/2018   ALKPHOS 88 06/04/2018   BILITOT 1.2 06/04/2018   Hematology Recent Labs  Lab 06/15/18 1216  WBC 8.9  RBC 3.02*  HGB 10.3*  HCT 31.3*  MCV 103.6*  MCH 34.1*  MCHC 32.9  RDW 15.8*  PLT 219   Cardiac Enzymes  Recent Labs  Lab 06/15/18 1240  TROPIPOC 0.74*    TSH:  Lab Results  Component Value Date   TSH 1.348 05/02/2018   Lipids: Lab Results  Component Value Date   CHOL 141 03/10/2017   HDL 22 (L) 03/10/2017   LDLCALC 62 03/10/2017   TRIG 284 (H) 03/10/2017   CHOLHDL 6.4 (H) 03/10/2017   HgbA1c: Lab Results  Component Value Date   HGBA1C 7.7 (H) 09/24/2017   Magnesium:  Magnesium  Date Value Ref Range Status  06/04/2018 2.0 1.7 - 2.4 mg/dL Final    Comment:    Performed at [facility], [place] [address], [place], [place] [address]   Lab Results  Component Value Date   INR 2.29 06/15/2018   INR 1.8 (A) 06/10/2018   INR 3.19 06/05/2018     Radiology/Studies:  Dg Chest Port 1 View  Result Date: 06/15/2018 CLINICAL DATA:  Chest pain.  Atrial fibrillation. EXAM: PORTABLE CHEST 1 VIEW COMPARISON:  06/04/2018 and 05/02/2018 and 12/05/2017 FINDINGS: Heart size and pulmonary vascularity are normal. Lungs are clear except for a tiny area of linear scarring in the left midzone. No bone abnormality. IMPRESSION: No active disease. Electronically Signed   By: Lorriane Shire M.D.   On: 06/15/2018 13:17    Assessment and Plan:   Principal Problem: 1.  Persistent atrial fibrillation with rapid ventricular response ([place]) - pt took Toprol XL 100 mg this am - currently also on Cardizem IV 10 mg/hr - HR is not controlled -  INR was 1.8 on 06/12 so cannot do DCCV w/out TEE - continue on IV Cardizem for now - may need antiarrhythmic w/ episodes occurring more frequently. - will ck TSH (nl in past) - CHA2DS2-VASc = 3 (HTN, DM, CHF) - Continue coumadin - add amio IV for now, convert to po once in SR  Otherwise, per IM and Nephrology Active Problems:   ESRD on dialysis [facility])   Essential hypertension   Chest pain with moderate risk for cardiac etiology  For questions or updates, please contact [place] Please consult [email or web address] for contact info under Cardiology/STEMI.   Signed,  Rosaria Ferries, PA-C  06/15/2018 4:28 PM  I have seen and examined the patient along with Rosaria Ferries, PA-C .  I have reviewed the chart, notes and new data.  I agree with PA/NP's note.  Key new complaints: I believe this is his 5th presentation with symptomatic atrial tachyarrhythmia in the last 6 months. Once underwent cardioversion, usually converts spontaneously. Rate control meds are limited by systemic hypotension, especially pre-HD mornings. Has angina and dyspnea with rapid rates Key examination changes: rapid irregular rhythm (130s) on 10 mg/h diltiazem IV, BP 103/58. No rales or JVD. Large R forearm AV fistula with excellent bruit/thrill.   Key new findings / data: echo c/w ischemic cardiomyopathy. No history of heart cath. Reports stress test "years ago in [place]", but none since moving to [place] 2 years ago.Has well established PAD. INR was subtherapeutic 5 days ago, Minimal troponin increase.  Reviewed multiple ECG tracings. None of the tracings shows atrial fibrillation. Mostly, he appears to have atrial flutter with 2:1 AV conduction and a VR 150. Flutter waves in V1 are upright, c/w typical atrial flutter, but in the inferior leads they are more isoelectric rather than sawtooth-like. On multiple ECGs in sinus rhythm the QT is mildly prolonged (483-501 ms), although on ECG from July QTc was 454 ms.   PLAN: He is not doing well with a rate control strategy and will need antiarrhythmic Rx, either with amiodarone (only drug option with ESRD and low EF and suspected CAD) or RF ablation.  Amiodarone may be limited by long QT. But offers the advantage of rate control for breakthrough events. Ablation may be a challenge since this may not be typical isthmus dependent flutter.  Early CV precluded by low INR and makes little sense until we implement antiarrhythmics.  Start IV amiodarone, at least 24 h, then elective TEE-guided DC cardioversion if he does not return to SR (tentatively scheduled for Wednesday). Plan long term amio, best option despite potential side effects. Note interaction amio-warfarin, will gradually require as much as 50% less warfarin as amio "kicks in". No interaction found with current HIV meds. Refer to EP to consider ablation if he develops excessive bradycardia or heart block on amiodarone (poor candidate for transvenous pacemaker) or excessive QT prolongation.  Needs investigation for low LVEF and wall motion abnormalities. If we can slow down heart rate, prefer coronary CT angio. If not, conventional coronary angio via femoral approach. This is non-urgent: angina is consistently triggered by the arrhythmia, not  in a true pattern of athero-thrombotic acute coronary sd.  Sanda Klein, MD, [place] [phone] 06/15/2018, 4:56 PM

## 2018-06-15 NOTE — Progress Notes (Signed)
Pt transferred from ED to 4E-02. CCMD called and pt placed on telemetry monitor. CHG done. Bed in lowest position. Call bell and phone within reach. Pt. Oriented to unit. Lajoyce Corners, RN

## 2018-06-15 NOTE — ED Notes (Signed)
Attempted report 

## 2018-06-15 NOTE — Progress Notes (Addendum)
  Amiodarone Drug - Drug Interaction Consult Note  Recommendations: Monitor patient for bleeding that can occur from elevations in INR with concomitant use of warfarin. Also monitor for bradycardia if receiving a diltiazem infusion. There can potentially be an interaction with Biktarvy, but the evidence is low quality.   Amiodarone is metabolized by the cytochrome P450 system and therefore has the potential to cause many drug interactions. Amiodarone has an average plasma half-life of 50 days (range 20 to 100 days).   There is potential for drug interactions to occur several weeks or months after stopping treatment and the onset of drug interactions may be slow after initiating amiodarone.   []  Statins: Increased risk of myopathy. Simvastatin- restrict dose to 20mg  daily. Other statins: counsel patients to report any muscle pain or weakness immediately.  [x]  Anticoagulants: Amiodarone can increase anticoagulant effect. Consider warfarin dose reduction. Patients should be monitored closely and the dose of anticoagulant altered accordingly, remembering that amiodarone levels take several weeks to stabilize.  []  Antiepileptics: Amiodarone can increase plasma concentration of phenytoin, the dose should be reduced. Note that small changes in phenytoin dose can result in large changes in levels. Monitor patient and counsel on signs of toxicity.  []  Beta blockers: increased risk of bradycardia, AV block and myocardial depression. Sotalol - avoid concomitant use.  [x]   Calcium channel blockers (diltiazem and verapamil): increased risk of bradycardia, AV block and myocardial depression.  []   Cyclosporine: Amiodarone increases levels of cyclosporine. Reduced dose of cyclosporine is recommended.  []  Digoxin dose should be halved when amiodarone is started.  []  Diuretics: increased risk of cardiotoxicity if hypokalemia occurs.  []  Oral hypoglycemic agents (glyburide, glipizide, glimepiride): increased  risk of hypoglycemia. Patient's glucose levels should be monitored closely when initiating amiodarone therapy.   []  Drugs that prolong the QT interval:  Torsades de pointes risk may be increased with concurrent use - avoid if possible.  Monitor QTc, also keep magnesium/potassium WNL if concurrent therapy can't be avoided. Marland Kitchen Antibiotics: e.g. fluoroquinolones, erythromycin. . Antiarrhythmics: e.g. quinidine, procainamide, disopyramide, sotalol. . Antipsychotics: e.g. phenothiazines, haloperidol.  . Lithium, tricyclic antidepressants, and methadone.  Thank You,   Leroy Libman, PharmD Pharmacy Resident

## 2018-06-15 NOTE — H&P (Addendum)
Date: 06/15/2018               Patient Name:  Thomas [name] MRN: [id number]  DOB: 11/22/1966 Age / Sex: 52 y.o., male   PCP: Patient, No Pcp Per         Medical Service: Internal Medicine Teaching Service         Attending Physician: Dr. Quintella Reichert, MD    First Contact: Dr. Isac Sarna Pager: [phone]  Second Contact: Dr. Danford Bad Pager: [phone]       After Hours (After 5p/  First Contact Pager: [phone]  weekends / holidays): Second Contact Pager: [phone]   Chief Complaint: chest pain  History of Present Illness: 52 year old male with past medical history of ESRD on HD Tuesday/Thursday/Saturday, T2DM S/P bilateral lower extremity amputation, HIV, PAF on warfarin.  He presents with another episode of demand ischemia.  He first noticed around yesterday evening at 5 PM he began having some chest pain and diaphoresis at the time he attributed to being hot outside.  It subsided, he went to sleep and did not think much of it however, and in the morning he again felt the same chest pain.  He went to participate in his rehab program before he started his exercises they took his pulse and found to be 155 sent him to the ED.  The patient does not recognize any inciting events in particular.  He reports that he has not missed a single dose of his metoprolol 100 mg including morning of ED presentation.  He has not changed the amount of fluid intake he drinks about two 10 ounce cups of water per day. He denies recent fever, chills or cough.  Has not had any sores or skin changes.  No nausea, vomiting or diarrhea.  He has not felt unwell in any way.  Before this episode he reports that his heart rate was in the 90's and he was asymptomatic.    ED course: pt arrived with chest pain HR 150 ecg consistent with Atrial flutter with a 2:1 block.  Symptoms resolved when rate more controlled.  Troponin elevated in setting of rapid rate which is typical for this patient.    Meds:  Current Meds    Medication Sig  . bictegravir-emtricitabine-tenofovir AF (BIKTARVY) 50-200-25 MG TABS tablet Take 1 tablet by mouth daily.  . camphor-menthol (SARNA) lotion Apply topically as needed for itching.  . cinacalcet (SENSIPAR) 90 MG tablet Take 90 mg by mouth daily.  . ferric gluconate 125 mg in sodium chloride 0.9 % 100 mL Inject 125 mg into the vein Every Tuesday,Thursday,and Saturday with dialysis.  Marland Kitchen gabapentin (NEURONTIN) 100 MG capsule Take 1 capsule (100 mg total) by mouth every dialysis.  Marland Kitchen insulin glargine (LANTUS) 100 unit/mL SOPN Inject 0.3 mLs (30 Units total) into the skin at bedtime. (Patient taking differently: Inject 20 Units into the skin at bedtime. )  . metoprolol succinate (TOPROL XL) 100 MG 24 hr tablet Take 1 tablet (100 mg total) by mouth daily. Take with or immediately following a meal.  . pantoprazole (PROTONIX) 40 MG tablet Take 2 tablets (80 mg total) by mouth daily.  . polycarbophil (FIBERCON) 625 MG tablet Take 1 tablet (625 mg total) by mouth daily.  . sevelamer carbonate (RENVELA) 800 MG tablet Take 800 mg by mouth 3 (three) times daily with meals.   . warfarin (COUMADIN) 7.5 MG tablet Take 1 tablet daily except 1/2 tablet on Sundays or as directed by Anticoagulation Clinic (  Patient taking differently: Take 7.5 mg by mouth See admin instructions. Take 7.5mg  daily except Sunday take 3.75mg )     Allergies: Allergies as of 06/15/2018 - Review Complete 06/15/2018  Allergen Reaction Noted  . Oxycodone-acetaminophen Other (See Comments) 01/13/2018   Past Medical History:  Diagnosis Date  . Anemia   . Chest pain with moderate risk for cardiac etiology 06/15/2018  . Chronic combined systolic and diastolic CHF (congestive heart failure) ([place])   . Diabetes ([place])   . Diabetic foot ulcer ([place]) 09/03/2017  . Diabetic wet gangrene of the foot ([place]) 09/04/2017  . ESRD (end stage renal disease) [facility])    [facility] [address] T, Th, Sat  . HIV disease ([place])   . Hyperparathyroidism,  secondary ([place])   . Hypertension   . Hypertensive heart disease with end stage renal disease on dialysis ([place]) 03/13/2017  . Subacute osteomyelitis, right ankle and foot ([place])   . SVT (supraventricular tachycardia) ([place])    ? afib or atrial flutter s/p TEE/DCCV with subsequent ablation due to reoccurrence in [place]  . Type 2 diabetes mellitus (HCC)     Family History:  Family History  Problem Relation Age of Onset  . Heart disease Mother   . Diabetes Mother   . Heart disease Father   . Cancer Neg Hx     Social History:  Social History   Socioeconomic History  . Marital status: Married    Spouse name: kim  . Number of children: 3  . Years of education: college  . Highest education level: Not on file  Occupational History  . Occupation: disabled  Social Needs  . Financial resource strain: Not on file  . Food insecurity:    Worry: Not on file    Inability: Not on file  . Transportation needs:    Medical: Not on file    Non-medical: Not on file  Tobacco Use  . Smoking status: Never Smoker  . Smokeless tobacco: Never Used  Substance and Sexual Activity  . Alcohol use: No  . Drug use: No  . Sexual activity: Not on file  Lifestyle  . Physical activity:    Days per week: Not on file    Minutes per session: Not on file  . Stress: Not on file  Relationships  . Social connections:    Talks on phone: Not on file    Gets together: Not on file    Attends religious service: Not on file    Active member of club or organization: Not on file    Attends meetings of clubs or organizations: Not on file    Relationship status: Not on file  . Intimate partner violence:    Fear of current or ex partner: Not on file    Emotionally abused: Not on file    Physically abused: Not on file    Forced sexual activity: Not on file  Other Topics Concern  . Not on file  Social History Narrative   Lives in [place] with wife.    Review of Systems: A complete ROS was negative except as  per HPI.  Physical Exam: Blood pressure 120/79, pulse 100, resp. rate 13, SpO2 98 %. Physical Exam  Constitutional: He appears well-developed and well-nourished.  HENT:  Head: Normocephalic and atraumatic.  Eyes: Right eye exhibits no discharge. Left eye exhibits no discharge. No scleral icterus.  Neck: No JVD present.  Cardiovascular: Regular rhythm, normal heart sounds and intact distal pulses. Tachycardia present. Exam reveals no  gallop and no friction rub.  No murmur heard. Pulmonary/Chest: Effort normal and breath sounds normal. No respiratory distress. He has no wheezes. He has no rales.  Abdominal: Soft. Bowel sounds are normal. He exhibits distension. He exhibits no mass. There is no tenderness. There is no guarding.  Neurological: He is alert.  Psychiatric: He has a normal mood and affect.    EKG: personally reviewed my interpretation is atrial flutter with 2:1 av block  CXR: personally reviewed my interpretation is no acute cardiopulmonary disease  Assessment & Plan by Problem: Principal Problem:   Persistent atrial fibrillation with rapid ventricular response (HCC) Active Problems:   ESRD on dialysis [facility])   Essential hypertension   Chest pain with moderate risk for cardiac etiology  Atrial flutter with 2:1 block: Pt symptomatic with demand ischemia.  Started on diltiazem drip, IV amiodarone.  Cardiology consulted plan for TEE cardioversion tomorrow if does not convert to sinus with amiodarone.   -admit to stepdown -continuous cardiac monitoring -continue diltiazem gtt, will begin amiodarone infusion  -warfarin dosing per pharmacy -will need sleep apnea evaluation  Chest Pain: secondary to demand ischemia, resolved with rate control.  Almost certainly has underlying CAD, wall motion abnormalities seen on last ECHO also with reduced EF and G2DD.    -would not continue to trend troponin -ischemic eval choice per cardiology  Ischemic cardiomyopathy HFrEF: pt not  volume overloaded on exam, cardiology will perform ischemic evaluation but not urgent  -ischemic eval per cardiology  ESRD: [facility] dialysis, has not missed dialysis, no complications at [facility] session.  -will need to consult nephrology for HD in am  T2DM: last A1C 7.05 September 2017.  Home regimen 30 units lantus   -will begin with lantus 10 units, SSI moderate   Dispo: Admit patient to Inpatient with expected length of stay greater than 2 midnights.  Signed: Katherine Roan, MD 06/15/2018, 5:12 PM

## 2018-06-15 NOTE — Progress Notes (Signed)
Internal Medicine Clinic Attending  Case discussed with Dr. Boswell at the time of the visit.  We reviewed the resident's history and exam and pertinent patient test results.  I agree with the assessment, diagnosis, and plan of care documented in the resident's note.  

## 2018-06-15 NOTE — ED Notes (Addendum)
Attempted report 

## 2018-06-15 NOTE — ED Provider Notes (Signed)
[facility] EMERGENCY DEPARTMENT Provider Note   CSN: [id number] Arrival date & time: 06/15/18  1206     History   Chief Complaint Chief Complaint  Patient presents with  . Atrial Fibrillation    HPI Thomas [name] is a 52 y.o. male.  The history is provided by the patient and the spouse. No language interpreter was used.  Atrial Fibrillation    Thomas [name] is a 52 y.o. male who presents to the Emergency Department complaining of atrial fibrillation. Presents to the emergency department complaining of atrial fibrillation that began last night. He states that last night he was sitting watching TV when he developed sudden onset palpitations in central chest pain. Symptoms of been constant since last night. He reports occasional shortness of breath. He denies any fevers, cough, nausea, vomiting. He has ESR D and is on dialysis Saturday, Tuesday, Thursday. His last dialysis session was on Saturday he completed a full course. He is compliant with all of his home medications including his Coumadin. He has a history of recurrent atrial fibrillation and has been admitted to times the last two months for similar symptoms. He states that he is required multiple medications and has had filled electrical cardioversions multiple times.  Past Medical History:  Diagnosis Date  . Anemia   . Chest pain with moderate risk for cardiac etiology 06/15/2018  . Chronic combined systolic and diastolic CHF (congestive heart failure) ([place])   . Diabetic foot ulcer ([place]) 09/03/2017  . Diabetic wet gangrene of the foot ([place]) 09/04/2017  . ESRD (end stage renal disease) on dialysis [facility])    [place] [address] [place], [place] (06/15/2018)  . Headache    "monthly" (06/15/2018)  . HIV disease ([place])   . Hyperparathyroidism, secondary ([place])   . Hypertension   . Hypertensive heart disease with end stage renal disease on dialysis ([place]) 03/13/2017  . Pneumonia X 1  . Subacute osteomyelitis, right ankle and  foot ([place])   . SVT (supraventricular tachycardia) ([place])    ? afib or atrial flutter s/p TEE/DCCV with subsequent ablation due to reoccurrence in [place]  . Type 2 diabetes mellitus [facility])     Patient Active Problem List   Diagnosis Date Noted  . Persistent atrial fibrillation with rapid ventricular response ([place]) 06/15/2018  . Chest pain with moderate risk for cardiac etiology 06/15/2018  . Atrial flutter ([place]) 06/15/2018  . Somniloquy 06/12/2018  . Encounter for therapeutic drug monitoring 05/27/2018  . Chronic systolic heart failure ([place])   . Demand ischemia ([place])   . Atrial fibrillation with RVR ([place]) 05/02/2018  . Murmur, cardiac 02/13/2018  . Mallory-Weiss tear 01/16/2018  . Morbid obesity ([place])   . Fall   . Subtherapeutic international normalized ratio (INR)   . History of left below knee amputation ([place]) 12/11/2017  . History of supraventricular tachycardia   . S/P bilateral BKA (below knee amputation) ([place])   . Anemia of chronic disease   . Leukocytosis   . Non-ischemic cardiomyopathy ([place])   . Hypokalemia 09/04/2017  . Essential hypertension 09/03/2017  . GERD (gastroesophageal reflux disease) 09/03/2017  . GIB (gastrointestinal bleeding) 07/26/2017  . Anemia due to end stage renal disease ([place]) 07/26/2017  . PAF (paroxysmal atrial fibrillation) ([place]) 07/26/2017  . Symptomatic anemia 07/26/2017  . End stage renal disease ([place]) 05/05/2017  . HIV disease ([place]) 03/25/2017  . Hypertensive heart disease with end stage renal disease on dialysis ([place]) 03/13/2017  . ESRD on dialysis ([place]) 03/13/2017  .  Type 2 diabetes mellitus with complication ([place]) 53/74/8270  . SVT (supraventricular tachycardia) ([facility])     Past Surgical History:  Procedure Laterality Date  . AMPUTATION Left    foot  . AMPUTATION Right 12/06/2017   Procedure: AMPUTATION BELOW KNEE;  Surgeon: [name], MD;  Location: [place];  Service: Orthopedics;  Laterality: Right;  . APPLICATION OF WOUND VAC Right  09/04/2017   Procedure: APPLICATION OF WOUND VAC;  Surgeon: [name], DPM;  Location: [place];  Service: Podiatry;  Laterality: Right;  . APPLICATION OF WOUND VAC  12/06/2017   Procedure: APPLICATION OF WOUND VAC;  Surgeon: [name], MD;  Location: [facility] [place];  Service: Orthopedics;;  . AV FISTULA PLACEMENT Right 10/19/2012  . AV FISTULA REPAIR Right ~ 02/2018   "had it cleaned out"  . BELOW KNEE LEG AMPUTATION Left ~ 2016  . COLONOSCOPY WITH PROPOFOL N/A 07/30/2017   Procedure: COLONOSCOPY WITH PROPOFOL;  Surgeon: [name], MD;  Location: [place];  Service: Endoscopy;  Laterality: N/A;  . ESOPHAGOGASTRODUODENOSCOPY (EGD) WITH PROPOFOL N/A 07/28/2017   Procedure: ESOPHAGOGASTRODUODENOSCOPY (EGD) WITH PROPOFOL;  Surgeon: [name], MD;  Location: [place];  Service: Endoscopy;  Laterality: N/A;  . ESOPHAGOGASTRODUODENOSCOPY (EGD) WITH PROPOFOL N/A 01/15/2018   Procedure: ESOPHAGOGASTRODUODENOSCOPY (EGD) WITH PROPOFOL;  Surgeon: [name], MD;  Location: [facility] ENDOSCOPY;  Service: Endoscopy;  Laterality: N/A;  . FLEXIBLE SIGMOIDOSCOPY N/A 07/27/2017   Procedure: FLEXIBLE SIGMOIDOSCOPY;  Surgeon: [name], MD;  Location: [facility] ENDOSCOPY;  Service: Endoscopy;  Laterality: N/A;  . GRAFT APPLICATION Right 78/67/5449   Procedure: SKIN GRAFT APPLICATION RIGHT FOOT;  Surgeon: [name], DPM;  Location: [place];  Service: Podiatry;  Laterality: Right;  . I&D EXTREMITY Right 09/04/2017   Procedure: IRRIGATION AND DEBRIDEMENT EXTREMITY;  Surgeon: [name], DPM;  Location: [place];  Service: Podiatry;  Laterality: Right;  . I&D EXTREMITY Right 11/12/2017   Procedure: IRRIGATION AND DEBRIDEMENT ULCER RIGHT FOOT;  Surgeon: [name], DPM;  Location: [place];  Service: Podiatry;  Laterality: Right;  . WOUND DEBRIDEMENT N/A 09/24/2017   Procedure: DEBRIDEMENT WOUND;  Surgeon: [name], DPM;  Location: [place];  Service: Podiatry;  Laterality: N/A;        Home  Medications    Prior to Admission medications   Medication Sig Start Date End Date Taking? Authorizing Provider  bictegravir-emtricitabine-tenofovir AF (BIKTARVY) 50-200-25 MG TABS tablet Take 1 tablet by mouth daily. 03/02/18  Yes Carlyle Basques, MD  camphor-menthol [facility]) lotion Apply topically as needed for itching. 12/11/17  Yes Colbert Ewing, MD  cinacalcet (SENSIPAR) 90 MG tablet Take 90 mg by mouth daily. 04/27/18  Yes [provider]  ferric gluconate 125 mg in sodium chloride 0.9 % 100 mL Inject 125 mg into the vein Every Tuesday,Thursday,and Saturday with dialysis. 05/10/17  Yes Hosie Poisson, MD  gabapentin (NEURONTIN) 100 MG capsule Take 1 capsule (100 mg total) by mouth every dialysis. 01/09/18  Yes Jamse Arn, MD  insulin glargine (LANTUS) 100 unit/mL SOPN Inject 0.3 mLs (30 Units total) into the skin at bedtime. Patient taking differently: Inject 20 Units into the skin at bedtime.  12/19/17  Yes Angiulli, Lavon Paganini, PA-C  metoprolol succinate (TOPROL XL) 100 MG 24 hr tablet Take 1 tablet (100 mg total) by mouth daily. Take with or immediately following a meal. 06/05/18 06/05/19 Yes Hoffman, Jessica Ratliff, DO  pantoprazole (PROTONIX) 40 MG tablet Take 2 tablets (80 mg total) by mouth  daily. 02/16/18  Yes Alphonzo Grieve, MD  polycarbophil (FIBERCON) 625 MG tablet Take 1 tablet (625 mg total) by mouth daily. 12/19/17  Yes Angiulli, Lavon Paganini, PA-C  sevelamer carbonate (RENVELA) 800 MG tablet Take 800 mg by mouth 3 (three) times daily with meals.  01/10/18  Yes [provider]  warfarin (COUMADIN) 7.5 MG tablet Take 1 tablet daily except 1/2 tablet on Sundays or as directed by Anticoagulation Clinic Patient taking differently: Take 7.5 mg by mouth See admin instructions. Take 7.90m daily except Sunday take 3.715m5/29/19  Yes Turner, TrEber HongMD    Family History Family History  Problem Relation Age of Onset  . Heart disease Mother   . Diabetes Mother   . Heart  disease Father   . Cancer Neg Hx     Social History Social History   Tobacco Use  . Smoking status: Never Smoker  . Smokeless tobacco: Never Used  Substance Use Topics  . Alcohol use: Never    Frequency: Never  . Drug use: Never     Allergies   Oxycodone-acetaminophen   Review of Systems Review of Systems  All other systems reviewed and are negative.    Physical Exam Updated Vital Signs BP 115/89 (BP Location: Left Arm)   Pulse (!) 134   Temp 97.7 F (36.5 C) (Oral)   Resp (!) 30   Ht 6' 6"  (1.981 m)   Wt 130.9 kg (288 lb 9.6 oz)   SpO2 99%   BMI 33.35 kg/m   Physical Exam  Constitutional: He is oriented to person, place, and time. He appears well-developed and well-nourished.  HENT:  Head: Normocephalic and atraumatic.  Cardiovascular:  No murmur heard. Tachycardic and irregular  Pulmonary/Chest: Effort normal. No respiratory distress.  Decreased air movement and bilateral bases  Abdominal: Soft. There is no tenderness. There is no rebound and no guarding.  Musculoskeletal:  Fistula in the right upper extremity. Bilateral BKA.  Neurological: He is alert and oriented to person, place, and time.  Skin: Skin is warm and dry.  Psychiatric: He has a normal mood and affect. His behavior is normal.  Nursing note and vitals reviewed.    ED Treatments / Results  Labs (all labs ordered are listed, but only abnormal results are displayed) Labs Reviewed  BASIC METABOLIC PANEL - Abnormal; Notable for the following components:      Result Value   Chloride 97 (*)    Glucose, Bld 297 (*)    BUN 62 (*)    Creatinine, Ser 11.63 (*)    GFR calc non Af Amer 4 (*)    GFR calc Af Amer 5 (*)    Anion gap 18 (*)    All other components within normal limits  CBC - Abnormal; Notable for the following components:   RBC 3.02 (*)    Hemoglobin 10.3 (*)    HCT 31.3 (*)    MCV 103.6 (*)    MCH 34.1 (*)    RDW 15.8 (*)    All other components within normal limits    PROTIME-INR - Abnormal; Notable for the following components:   Prothrombin Time 25.0 (*)    All other components within normal limits  TROPONIN I - Abnormal; Notable for the following components:   Troponin I 1.27 (*)    All other components within normal limits  GLUCOSE, CAPILLARY - Abnormal; Notable for the following components:   Glucose-Capillary 273 (*)    All other components within normal limits  I-STAT TROPONIN, ED - Abnormal; Notable for the following components:   Troponin i, poc 0.74 (*)    All other components within normal limits  MRSA PCR SCREENING  PROTIME-INR  CBC  TROPONIN I  MAGNESIUM  RENAL FUNCTION PANEL    EKG EKG Interpretation  Date/Time:  Monday June 15 2018 12:11:55 EDT Ventricular Rate:  154 PR Interval:    QRS Duration: 86 QT Interval:  306 QTC Calculation: 490 R Axis:   81 Text Interpretation:  Atrial flutter with 2:1 A-V conduction Nonspecific ST abnormality Abnormal ECG Confirmed by Quintella Reichert [phone]) on 06/15/2018 12:35:28 PM   Radiology Dg Chest Port 1 View  Result Date: 06/15/2018 CLINICAL DATA:  Chest pain.  Atrial fibrillation. EXAM: PORTABLE CHEST 1 VIEW COMPARISON:  06/04/2018 and 05/02/2018 and 12/05/2017 FINDINGS: Heart size and pulmonary vascularity are normal. Lungs are clear except for a tiny area of linear scarring in the left midzone. No bone abnormality. IMPRESSION: No active disease. Electronically Signed   By: Lorriane Shire M.D.   On: 06/15/2018 13:17    Procedures Procedures (including critical care time) CRITICAL CARE Performed by: Quintella Reichert   Total critical care time: 35 minutes  Critical care time was exclusive of separately billable procedures and treating other patients.  Critical care was necessary to treat or prevent imminent or life-threatening deterioration.  Critical care was time spent personally by me on the following activities: development of treatment plan with patient and/or surrogate as  well as nursing, discussions with consultants, evaluation of patient's response to treatment, examination of patient, obtaining history from patient or surrogate, ordering and performing treatments and interventions, ordering and review of laboratory studies, ordering and review of radiographic studies, pulse oximetry and re-evaluation of patient's condition.  Medications Ordered in ED Medications  diltiazem (CARDIZEM) 1 mg/mL load via infusion 15 mg (15 mg Intravenous Bolus from Bag 06/15/18 1337)    And  diltiazem (CARDIZEM) 100 mg in dextrose 5 % 100 mL (1 mg/mL) infusion (0 mg/hr Intravenous Stopping Infusion hung by another clincian 06/15/18 2102)  Warfarin - Pharmacist Dosing Inpatient ( Does not apply Given 06/15/18 1837)  amiodarone (NEXTERONE PREMIX) 360-4.14 MG/200ML-% (1.8 mg/mL) IV infusion (60 mg/hr Intravenous New Bag/Given 06/15/18 2049)  amiodarone (NEXTERONE PREMIX) 360-4.14 MG/200ML-% (1.8 mg/mL) IV infusion (has no administration in time range)  bictegravir-emtricitabine-tenofovir AF (BIKTARVY) 50-200-25 MG per tablet 1 tablet (has no administration in time range)  camphor-menthol (SARNA) lotion (has no administration in time range)  cinacalcet (SENSIPAR) tablet 90 mg (has no administration in time range)  gabapentin (NEURONTIN) capsule 100 mg (has no administration in time range)  pantoprazole (PROTONIX) EC tablet 80 mg (has no administration in time range)  sevelamer carbonate (RENVELA) tablet 800 mg (has no administration in time range)  insulin glargine (LANTUS) injection 10 Units (10 Units Subcutaneous Given 06/15/18 2101)  insulin aspart (novoLOG) injection 0-15 Units (has no administration in time range)  0.9 %  sodium chloride infusion (has no administration in time range)  0.9 %  sodium chloride infusion (has no administration in time range)  alteplase (CATHFLO ACTIVASE) injection 2 mg (has no administration in time range)  heparin injection 1,000 Units (has no  administration in time range)  lidocaine (PF) (XYLOCAINE) 1 % injection 5 mL (has no administration in time range)  lidocaine-prilocaine (EMLA) cream 1 application (has no administration in time range)  pentafluoroprop-tetrafluoroeth (GEBAUERS) aerosol 1 application (has no administration in time range)  feeding supplement (ENSURE ENLIVE) (ENSURE ENLIVE) liquid 237  mL (has no administration in time range)  acetaminophen (TYLENOL) tablet 1,000 mg (1,000 mg Oral Given 06/15/18 1355)  warfarin (COUMADIN) tablet 7.5 mg (7.5 mg Oral Given 06/15/18 1837)     Initial Impression / Assessment and Plan / ED Course  I have reviewed the triage vital signs and the nursing notes.  Pertinent labs & imaging results that were available during my care of the patient were reviewed by me and considered in my medical decision making (see chart for details).     Patient with history of ESR D on hemodialysis as well as atrial fibrillation here for evaluation of recurrent palpitations since yesterday. He does have a history of failed electrical cardioversion's in the past. On record review he has responded well to diltiazem therapy. He was treated with Cardizem in the emergency department and cardiology consulted for further treatment recommendations. He was evaluated by cardiology in the emergency department, recommend admission to medicine for initiation of amiodarone therapy. Medicine consulted for admission for further treatment. Patient updated of findings of studies and he is in agreement with treatment plan.  Final Clinical Impressions(s) / ED Diagnoses   Final diagnoses:  None    ED Discharge Orders    None       Quintella Reichert, MD 06/15/18 2312

## 2018-06-15 NOTE — ED Triage Notes (Signed)
Pt states he woke up this morning with afib. Pt states he did dialysis Saturday. Pt reports some chest pain as well.

## 2018-06-15 NOTE — ED Notes (Signed)
Per MD, Pt is OK to eat.

## 2018-06-15 NOTE — Progress Notes (Signed)
CRITICAL VALUE ALERT  Critical Value: Troponin 1.27  Date & Time Notied: 6/17 @ 10:05  Provider Notified: Cardiologist on call notified  Orders Received/Actions taken: awaiting orders  Lajoyce Corners, RN

## 2018-06-15 NOTE — Therapy (Signed)
[facility] [address] [place], [place], [address] Phone: [phone]   Fax:  [phone]  Patient Details  Name: Thomas [name] MRN: [id number] Date of Birth: August 30, 1966 Referring Provider:  [name], MD  Encounter Date: 06/15/2018 Pt put on hold per primary PT.  Pt came to therapy but reported that his heart is racing. Checked HR 154 bpm.  Notified primary PT.  Pt's wife present and took pt to ED.  Thomas [name], PTA  06/15/18, 12:07 PM [facility] [address] [place], [place], [address] Phone: [phone]   Fax:  [phone]

## 2018-06-16 ENCOUNTER — Encounter (HOSPITAL_COMMUNITY): Payer: Self-pay | Admitting: General Practice

## 2018-06-16 ENCOUNTER — Other Ambulatory Visit: Payer: Self-pay

## 2018-06-16 DIAGNOSIS — I481 Persistent atrial fibrillation: Secondary | ICD-10-CM

## 2018-06-16 DIAGNOSIS — I214 Non-ST elevation (NSTEMI) myocardial infarction: Secondary | ICD-10-CM

## 2018-06-16 LAB — CBC
HCT: 27.8 % — ABNORMAL LOW (ref 39.0–52.0)
Hemoglobin: 9.7 g/dL — ABNORMAL LOW (ref 13.0–17.0)
MCH: 35.7 pg — AB (ref 26.0–34.0)
MCHC: 34.9 g/dL (ref 30.0–36.0)
MCV: 102.2 fL — AB (ref 78.0–100.0)
PLATELETS: 235 10*3/uL (ref 150–400)
RBC: 2.72 MIL/uL — AB (ref 4.22–5.81)
RDW: 15.8 % — AB (ref 11.5–15.5)
WBC: 9.1 10*3/uL (ref 4.0–10.5)

## 2018-06-16 LAB — RENAL FUNCTION PANEL
ANION GAP: 21 — AB (ref 5–15)
Albumin: 3.5 g/dL (ref 3.5–5.0)
BUN: 78 mg/dL — ABNORMAL HIGH (ref 6–20)
CALCIUM: 8.3 mg/dL — AB (ref 8.9–10.3)
CO2: 17 mmol/L — AB (ref 22–32)
Chloride: 99 mmol/L — ABNORMAL LOW (ref 101–111)
Creatinine, Ser: 12.91 mg/dL — ABNORMAL HIGH (ref 0.61–1.24)
GFR calc non Af Amer: 4 mL/min — ABNORMAL LOW (ref >60)
GFR, EST AFRICAN AMERICAN: 4 mL/min — AB (ref >60)
Glucose, Bld: 250 mg/dL — ABNORMAL HIGH (ref 65–99)
POTASSIUM: 4.2 mmol/L (ref 3.5–5.1)
Phosphorus: 8.4 mg/dL — ABNORMAL HIGH (ref 2.5–4.6)
SODIUM: 137 mmol/L (ref 135–145)

## 2018-06-16 LAB — PROTIME-INR
INR: 2.29
PROTHROMBIN TIME: 25 s — AB (ref 11.4–15.2)

## 2018-06-16 LAB — TROPONIN I
Troponin I: 1.38 ng/mL (ref <0.03)
Troponin I: 0.99 ng/mL (ref <0.03)

## 2018-06-16 LAB — MAGNESIUM: Magnesium: 1.7 mg/dL (ref 1.7–2.4)

## 2018-06-16 LAB — GLUCOSE, CAPILLARY
GLUCOSE-CAPILLARY: 200 mg/dL — AB (ref 65–99)
Glucose-Capillary: 232 mg/dL — ABNORMAL HIGH (ref 65–99)
Glucose-Capillary: 221 mg/dL — ABNORMAL HIGH (ref 65–99)

## 2018-06-16 LAB — MRSA PCR SCREENING: MRSA BY PCR: NEGATIVE

## 2018-06-16 MED ORDER — MAGNESIUM SULFATE 2 GM/50ML IV SOLN
2.0000 g | Freq: Once | INTRAVENOUS | Status: AC
  Administered 2018-06-16: 2 g via INTRAVENOUS
  Filled 2018-06-16: qty 50

## 2018-06-16 MED ORDER — FERRIC CITRATE 1 GM 210 MG(FE) PO TABS
840.0000 mg | ORAL_TABLET | Freq: Three times a day (TID) | ORAL | Status: DC
Start: 2018-06-16 — End: 2018-06-19
  Administered 2018-06-16 – 2018-06-19 (×6): 840 mg via ORAL
  Filled 2018-06-16 (×11): qty 4

## 2018-06-16 MED ORDER — DOXERCALCIFEROL 4 MCG/2ML IV SOLN
6.0000 ug | INTRAVENOUS | Status: DC
  Filled 2018-06-16 (×2): qty 4

## 2018-06-16 MED ORDER — DOXERCALCIFEROL 4 MCG/2ML IV SOLN
INTRAVENOUS | Status: AC
  Administered 2018-06-16: 6 ug
  Filled 2018-06-16: qty 4

## 2018-06-16 MED ORDER — AMIODARONE HCL 200 MG PO TABS
400.0000 mg | ORAL_TABLET | Freq: Two times a day (BID) | ORAL | Status: DC
  Administered 2018-06-16 – 2018-06-19 (×7): 400 mg via ORAL
  Filled 2018-06-16 (×7): qty 2

## 2018-06-16 MED ORDER — DARBEPOETIN ALFA 40 MCG/0.4ML IJ SOSY
40.0000 ug | PREFILLED_SYRINGE | INTRAMUSCULAR | Status: DC
  Filled 2018-06-16: qty 0.4

## 2018-06-16 MED ORDER — METOPROLOL SUCCINATE ER 100 MG PO TB24
100.0000 mg | ORAL_TABLET | Freq: Every day | ORAL | Status: DC
  Administered 2018-06-16 – 2018-06-19 (×4): 100 mg via ORAL
  Filled 2018-06-16 (×5): qty 1

## 2018-06-16 MED ORDER — DARBEPOETIN ALFA 40 MCG/0.4ML IJ SOSY
PREFILLED_SYRINGE | INTRAMUSCULAR | Status: AC
  Administered 2018-06-16: 40 ug
  Filled 2018-06-16: qty 0.4

## 2018-06-16 MED ORDER — CHLORHEXIDINE GLUCONATE CLOTH 2 % EX PADS
6.0000 | MEDICATED_PAD | Freq: Every day | CUTANEOUS | Status: DC
  Administered 2018-06-16 – 2018-06-19 (×3): 6 via TOPICAL

## 2018-06-16 MED ORDER — WARFARIN SODIUM 7.5 MG PO TABS: 7.5000 mg | ORAL_TABLET | Freq: Once | ORAL | Status: DC

## 2018-06-16 NOTE — Progress Notes (Signed)
Inpatient Diabetes Program Recommendations  AACE/ADA: New Consensus Statement on Inpatient Glycemic Control (2019)  Target Ranges:  Prepandial:   less than 140 mg/dL      Peak postprandial:   less than 180 mg/dL (1-2 hours)      Critically ill patients:  140 - 180 mg/dL   Results for MILBURN, FREENEY (MRN [id number]) as of 06/16/2018 12:03  Ref. Range 06/15/2018 20:46 06/16/2018 06:06 06/16/2018 11:21  Glucose-Capillary Latest Ref Range: 65 - 99 mg/dL 273 (H) 232 (H) 200 (H)   Review of Glycemic Control  Diabetes history: DM2 Outpatient Diabetes medications: Lantus 20 units QHS Current orders for Inpatient glycemic control: Lantus 10 units daily, Novolog 0-15 units TID with meals  Inpatient Diabetes Program Recommendations: Insulin - Basal: Please consider increasing Lantus to 20 units daily. Correction (SSI): Please consider ordering Novolog 0-5 units QHS for bedtime correction scale.  Thanks, Barnie Alderman, RN, MSN, CDE Diabetes Coordinator Inpatient Diabetes Program [phone] (Team Pager from 8am to 5pm)

## 2018-06-16 NOTE — Consult Note (Signed)
[name] Renal Consultation Note    Indication for Consultation:  Management of ESRD/hemodialysis, anemia, hypertension/volume, and secondary hyperparathyroidism. PCP:  HPI: Thomas [name] States is a 52 y.o. male with ESRD, Type 2 DM, HTN, PVD (s/p B BKAs), HIV, Hx dysrhythmias (SVT, A-fib RVR, A-flutter) in past who was admitted with recurrent atrial flutter.   S/p recent overnight admits 5/43-05/03/18 and again 6/6-06/05/18 with A-fib RVR. Was feeling well s/p last admit, but then he noted a short episode of CP with diaphoresis on 6/16 which resolved, then symptoms recurred during PT on 6/17 and he was noted to have HR > 150 and was sent to ED for evaluation. In the ED, found to be in A-flutter with 2:1 block. Labs showed K 4.1, Ca 9.1, WBC 8.9, Hgb 10.3, Trop 0.74 which later rose to 1.27 and further to 1.38. Cardiology consulted and diltiazem and IV amiodarone started with plan for possible TEE cardioversion this morning. At this point, he feels well. No CP/dyspnea. His rhythm appears to be back in NSR. No fever, chills, N/V.  Dialyzes on TTS schedule at [facility]. His last HD was Saturday 6/15, which he completed in entirety via his R AVF. No recent HD issues.  Past Medical History:  Diagnosis Date  . Anemia   . Chest pain with moderate risk for cardiac etiology 06/15/2018  . Chronic combined systolic and diastolic CHF (congestive heart failure) ([name])   . Diabetic foot ulcer ([name]) 09/03/2017  . Diabetic wet gangrene of the foot ([name]) 09/04/2017  . ESRD (end stage renal disease) on dialysis [name])    [name] [place], [place], [place] (06/15/2018)  . Headache    "monthly" (06/15/2018)  . HIV disease ([place])   . Hyperparathyroidism, secondary ([place])   . Hypertension   . Hypertensive heart disease with end stage renal disease on dialysis ([name]) 03/13/2017  . Pneumonia X 1  . Subacute osteomyelitis, right ankle and foot ([name])   . SVT (supraventricular tachycardia) ([name])    ? afib or  atrial flutter s/p TEE/DCCV with subsequent ablation due to reoccurrence in [place]  . Type 2 diabetes mellitus ([name])    Past Surgical History:  Procedure Laterality Date  . AMPUTATION Left    foot  . AMPUTATION Right 12/06/2017   Procedure: AMPUTATION BELOW KNEE;  Surgeon: [name], MD;  Location: [place];  Service: Orthopedics;  Laterality: Right;  . APPLICATION OF WOUND VAC Right 09/04/2017   Procedure: APPLICATION OF WOUND VAC;  Surgeon: [name], DPM;  Location: [place];  Service: Podiatry;  Laterality: Right;  . APPLICATION OF WOUND VAC  12/06/2017   Procedure: APPLICATION OF WOUND VAC;  Surgeon: [name], MD;  Location: [place];  Service: Orthopedics;;  . AV FISTULA PLACEMENT Right 10/19/2012  . AV FISTULA REPAIR Right ~ 02/2018   "had it cleaned out"  . BELOW KNEE LEG AMPUTATION Left ~ 2016  . COLONOSCOPY WITH PROPOFOL N/A 07/30/2017   Procedure: COLONOSCOPY WITH PROPOFOL;  Surgeon: [name], MD;  Location: [place];  Service: Endoscopy;  Laterality: N/A;  . ESOPHAGOGASTRODUODENOSCOPY (EGD) WITH PROPOFOL N/A 07/28/2017   Procedure: ESOPHAGOGASTRODUODENOSCOPY (EGD) WITH PROPOFOL;  Surgeon: [name], MD;  Location: [place];  Service: Endoscopy;  Laterality: N/A;  . ESOPHAGOGASTRODUODENOSCOPY (EGD) WITH PROPOFOL N/A 01/15/2018   Procedure: ESOPHAGOGASTRODUODENOSCOPY (EGD) WITH PROPOFOL;  Surgeon: [name], MD;  Location: [facility] ENDOSCOPY;  Service: Endoscopy;  Laterality: N/A;  . FLEXIBLE SIGMOIDOSCOPY N/A 07/27/2017   Procedure:  FLEXIBLE SIGMOIDOSCOPY;  Surgeon: [name], MD;  Location: [place];  Service: Endoscopy;  Laterality: N/A;  . GRAFT APPLICATION Right 16/09/9603   Procedure: SKIN GRAFT APPLICATION RIGHT FOOT;  Surgeon: [name], DPM;  Location: [place];  Service: Podiatry;  Laterality: Right;  . I&D EXTREMITY Right 09/04/2017   Procedure: IRRIGATION AND DEBRIDEMENT EXTREMITY;  Surgeon: [name], DPM;  Location: [place];   Service: Podiatry;  Laterality: Right;  . I&D EXTREMITY Right 11/12/2017   Procedure: IRRIGATION AND DEBRIDEMENT ULCER RIGHT FOOT;  Surgeon: [name], DPM;  Location: [place];  Service: Podiatry;  Laterality: Right;  . WOUND DEBRIDEMENT N/A 09/24/2017   Procedure: DEBRIDEMENT WOUND;  Surgeon: [name], DPM;  Location: [place];  Service: Podiatry;  Laterality: N/A;   Family History  Problem Relation Age of Onset  . Heart disease Mother   . Diabetes Mother   . Heart disease Father   . Cancer Neg Hx    Social History:  reports that he has never smoked. He has never used smokeless tobacco. He reports that he does not drink alcohol or use drugs.  ROS: As per HPI otherwise negative.  Physical Exam: Vitals:   06/15/18 2000 06/15/18 2349 06/16/18 0517 06/16/18 0800  BP: 115/89 134/89 (!) 100/57 101/72  Pulse: (!) 134 (!) 105 77   Resp: (!) 30 20 (!) 22 19  Temp: 97.7 F (36.5 C) 98.1 F (36.7 C) 97.7 F (36.5 C) 97.7 F (36.5 C)  TempSrc: Oral Oral Oral Oral  SpO2: 99% 96% 97% 98%  Weight: 130.9 kg (288 lb 9.6 oz)     Height: 6\' 6"  (1.981 m)        General: Well developed, overweight man, in no acute distress. Head: Normocephalic, atraumatic, sclera non-icteric, mucus membranes are moist. Neck: Supple without lymphadenopathy/masses. JVD not elevated. Lungs: Clear bilaterally to auscultation without wheezes, rales, or rhonchi. Breathing is unlabored. Heart: RRR with normal S1, S2. No murmurs, rubs, or gallops appreciated. Abdomen: Soft, non-tender, distended with normoactive bowel sounds. No rebound/guarding.  Musculoskeletal:  Strength and tone appear normal for age. Lower extremities: B BKA; no stump edema Neuro: Alert and oriented X 3. Moves all extremities spontaneously. Psych:  Responds to questions appropriately with a normal affect. Dialysis Access: R forearm AVF + thrill  Allergies  Allergen Reactions  . Oxycodone-Acetaminophen Other (See Comments)     Hallucinations/delirum   Prior to Admission medications   Medication Sig Start Date End Date Taking? Authorizing Provider  bictegravir-emtricitabine-tenofovir AF (BIKTARVY) 50-200-25 MG TABS tablet Take 1 tablet by mouth daily. 03/02/18  Yes Carlyle Basques, MD  camphor-menthol [facility]) lotion Apply topically as needed for itching. 12/11/17  Yes Colbert Ewing, MD  cinacalcet (SENSIPAR) 90 MG tablet Take 90 mg by mouth daily. 04/27/18  Yes [provider]  ferric gluconate 125 mg in sodium chloride 0.9 % 100 mL Inject 125 mg into the vein Every Tuesday,Thursday,and Saturday with dialysis. 05/10/17  Yes Hosie Poisson, MD  gabapentin (NEURONTIN) 100 MG capsule Take 1 capsule (100 mg total) by mouth every dialysis. 01/09/18  Yes Jamse Arn, MD  insulin glargine (LANTUS) 100 unit/mL SOPN Inject 0.3 mLs (30 Units total) into the skin at bedtime. Patient taking differently: Inject 20 Units into the skin at bedtime.  12/19/17  Yes Angiulli, Lavon Paganini, PA-C  metoprolol succinate (TOPROL XL) 100 MG 24 hr tablet Take 1 tablet (100 mg total) by mouth daily. Take with or immediately following a meal.  06/05/18 06/05/19 Yes Hoffman, Jessica Ratliff, DO  pantoprazole (PROTONIX) 40 MG tablet Take 2 tablets (80 mg total) by mouth daily. 02/16/18  Yes Alphonzo Grieve, MD  polycarbophil (FIBERCON) 625 MG tablet Take 1 tablet (625 mg total) by mouth daily. 12/19/17  Yes Angiulli, Lavon Paganini, PA-C  sevelamer carbonate (RENVELA) 800 MG tablet Take 800 mg by mouth 3 (three) times daily with meals.  01/10/18  Yes [provider]  warfarin (COUMADIN) 7.5 MG tablet Take 1 tablet daily except 1/2 tablet on Sundays or as directed by Anticoagulation Clinic Patient taking differently: Take 7.5 mg by mouth See admin instructions. Take 7.5mg  daily except Sunday take 3.75mg  05/27/18  Yes Turner, Eber Hong, MD   Current Facility-Administered Medications  Medication Dose Route Frequency Provider Last Rate Last Dose  . 0.9  %  sodium chloride infusion  100 mL Intravenous PRN Ejigiri, Thomos Lemons, PA-C      . 0.9 %  sodium chloride infusion  100 mL Intravenous PRN Ejigiri, Thomos Lemons, PA-C      . alteplase (CATHFLO ACTIVASE) injection 2 mg  2 mg Intracatheter Once PRN Lynnda Child, PA-C      . amiodarone (NEXTERONE PREMIX) 360-4.14 MG/200ML-% (1.8 mg/mL) IV infusion  30 mg/hr Intravenous Continuous Katherine Roan, MD 16.7 mL/hr at 06/16/18 0323 30 mg/hr at 06/16/18 0323  . bictegravir-emtricitabine-tenofovir AF (BIKTARVY) 50-200-25 MG per tablet 1 tablet  1 tablet Oral Daily Katherine Roan, MD   1 tablet at 06/16/18 0945  . camphor-menthol (SARNA) lotion   Topical PRN Katherine Roan, MD      . cinacalcet (SENSIPAR) tablet 90 mg  90 mg Oral Q breakfast Katherine Roan, MD   90 mg at 06/16/18 0945  . diltiazem (CARDIZEM) 100 mg in dextrose 5 % 100 mL (1 mg/mL) infusion  5-15 mg/hr Intravenous Continuous Quintella Reichert, MD 5 mL/hr at 06/16/18 0852 5 mg/hr at 06/16/18 0852  . feeding supplement (ENSURE ENLIVE) (ENSURE ENLIVE) liquid 237 mL  237 mL Oral BID BM Narendra, Nischal, MD      . gabapentin (NEURONTIN) capsule 100 mg  100 mg Oral Q T,Th,Sa-HD Katherine Roan, MD      . heparin injection 1,000 Units  1,000 Units Dialysis PRN Lynnda Child, PA-C      . insulin aspart (novoLOG) injection 0-15 Units  0-15 Units Subcutaneous TID WC Katherine Roan, MD   5 Units at 06/16/18 [id number]  . insulin glargine (LANTUS) injection 10 Units  10 Units Subcutaneous QHS Katherine Roan, MD   10 Units at 06/15/18 2101  . lidocaine (PF) (XYLOCAINE) 1 % injection 5 mL  5 mL Intradermal PRN Lynnda Child, PA-C      . lidocaine-prilocaine (EMLA) cream 1 application  1 application Topical PRN Lynnda Child, PA-C      . magnesium sulfate IVPB 2 g 50 mL  2 g Intravenous Once Welford Roche, MD   Stopped at 06/16/18 1045  . pantoprazole (PROTONIX) EC tablet 80 mg  80 mg Oral Daily  Katherine Roan, MD   80 mg at 06/16/18 0945  . pentafluoroprop-tetrafluoroeth (GEBAUERS) aerosol 1 application  1 application Topical PRN Ejigiri, Thomos Lemons, PA-C      . sevelamer carbonate (RENVELA) tablet 800 mg  800 mg Oral TID WC Winfrey, William B, MD      . warfarin (COUMADIN) tablet 7.5 mg  7.5 mg Oral ONCE-1800 Aldine Contes, MD      . Warfarin -  Pharmacist Dosing Inpatient   Does not apply q1800 Leroy Libman, [facility]       Labs: Basic Metabolic Panel: Recent Labs  Lab 06/15/18 1216 06/16/18 0247  NA 138 137  K 4.1 4.2  CL 97* 99*  CO2 23 17*  GLUCOSE 297* 250*  BUN 62* 78*  CREATININE 11.63* 12.91*  CALCIUM 9.1 8.3*  PHOS  --  8.4*   Liver Function Tests: Recent Labs  Lab 06/16/18 0247  ALBUMIN 3.5   CBC: Recent Labs  Lab 06/15/18 1216 06/16/18 0247  WBC 8.9 9.1  HGB 10.3* 9.7*  HCT 31.3* 27.8*  MCV 103.6* 102.2*  PLT 219 235   Cardiac Enzymes: Recent Labs  Lab 06/15/18 2042 06/16/18 0247  TROPONINI 1.27* 1.38*   CBG: Recent Labs  Lab 06/15/18 2046 06/16/18 0606  GLUCAP 273* 232*   Studies/Results: Dg Chest Port 1 View  Result Date: 06/15/2018 CLINICAL DATA:  Chest pain.  Atrial fibrillation. EXAM: PORTABLE CHEST 1 VIEW COMPARISON:  06/04/2018 and 05/02/2018 and 12/05/2017 FINDINGS: Heart size and pulmonary vascularity are normal. Lungs are clear except for a tiny area of linear scarring in the left midzone. No bone abnormality. IMPRESSION: No active disease. Electronically Signed   By: Lorriane Shire M.D.   On: 06/15/2018 13:17   Dialysis Orders:  TTS at [facility] 4:15hr, 3K/2.25Ca, BFR 500, DFR 800, EDW 120kg, AVF, no heparin (on warfarin) - Hectoral 103mcg IV q HD - No venofer, ESA - Binder: Auryxia 4/meals.  Assessment/Plan: 1.  A-flutter w/ 2:1 block: Seems to have possibly converted to NSR on monitor this morning with diltiazem and amiodarone drips, for possible TEE today. On warfarin. ^ troponin - likely demand ischemia. Ischemic  evaluation per cardiology. 2.  ESRD: Will continue HD per usual TTS schedule, for HD today. No heparin w/ HD. 3.  Hypertension/volume: BP low side. Per Epic weight, he is 10kg up from EDW - will get weight pre-HD, 5L goal today. 4.  Anemia: Hgb 9.7. Not on ESA as outpatient, will start Aranesp 60mcg weekly here. 5.  Metabolic bone disease: Ca ok, Phos high. Continue Auryxia, Sensipar, Hectoral. 6.  Type 2 DM: per primary. 7.  HIV: per primary. 8.  Prior known A-fib (on warfarin and metoprolol)  Veneta Penton, PA-C 06/16/2018, 10:42 AM  [facility] Pager: [phone]

## 2018-06-16 NOTE — Progress Notes (Signed)
Date: 06/16/2018  Patient name: Thomas [name]  Medical record number: [id number]  Date of birth: 06-19-1966 in brief, patient is 52 year old male with past medical history of end-stage renal disease on hemodialysis, type 2 diabetes  I have seen and evaluated Elizabeth Sauer and discussed their care with the Residency Team.  In brief, patient is a 52 year old male with a past medical history of end-stage renal disease on hemodialysis, type 2 diabetes, PAD status post bilateral lower extremity amputation, HIV, paroxysmal A. fib on warfarin who presented to the ED with recurrent chest pain over the last day.  Patient was recently admitted to the hospital from June 6 to June 7 with chest pain and was found to be in A. fib with RVR and was discharged home on metoprolol XL 100 mg daily.  Patient states that he was feeling well until the day prior to his admission when he developed sudden onset of chest pain associated with some mild diaphoresis.  Patient describes the chest pain as substernal, nonradiating, pressure-like ("like someone sitting on my chest").  His pain initially resolved on its own but the next morning he developed recurrent chest pain and was noted to be tachycardic to the 150s at his rehab program and was sent to the ED for further work-up.  In the ED patient was noted to be in a flutter with a 2-1 conduction block and he was admitted for further work-up.  Today patient states that he feels well and that his chest pain has resolved.  He has no new complaints at this time.   PMHx, Fam Hx, and/or Soc Hx : As per resident admit note  Vitals:   06/16/18 0800 06/16/18 1119  BP: 101/72   Pulse:  78  Resp: 19 (!) 21  Temp: 97.7 F (36.5 C) (!) 97.5 F (36.4 C)  SpO2: 98%    General: Awake, alert, oriented x3, NAD CVS: Regular rate and irregularly irregular rhythm, normal heart sounds Lungs: CTA bilaterally abdomen: Soft, nontender, nondistended, normoactive bowel sounds Extremities:  Bilateral BKA noted  Assessment and Plan: I have seen and evaluated the patient as outlined above. I agree with the formulated Assessment and Plan as detailed in the residents' note, with the following changes:   1.  Chest pain: -Patient presented to the ED with recurrent chest pain in the setting of a recent admission for A. fib with RVR and was found to have a flutter with 2-1 block and was admitted further evaluation.  I suspect that his chest pain is likely secondary to his a flutter even though ischemia is a concern given his mildly elevated troponin and history of severe peripheral arterial disease -Cardiology follow-up and recommendations appreciated -Patient was started on IV amiodarone and Cardizem drip on admission and converted to normal sinus rhythm early this morning -Patient is now in normal sinus rhythm with  frequent APCs -We will continue with IV amiodarone to complete a 24-hour load and then transition him to oral amiodarone -Continue with warfarin for A. Fib/a flutter -Patient's chest pain has resolved.  However, he was noted to have elevated troponins which peaked at 1.38 possibly demand ischemia in the setting of atrial flutter -Patient is to be scheduled for coronary angiogram given concern for ischemia.  Given his history of PAD and ESRD it is likely that he has multivessel coronary artery disease and may require bypass. -No further work-up at this time.  We will continue to monitor him on telemetry   Aldine Contes, MD 6/18/201911:30  AM

## 2018-06-16 NOTE — Progress Notes (Signed)
   Subjective:  No acute events overnight. Denies chest pain and shortness of breath. Reports no complaints this morning.   Objective:  Vital signs in last 24 hours: Vitals:   06/15/18 2000 06/15/18 2349 06/16/18 0517 06/16/18 0800  BP: 115/89 134/89 (!) 100/57 101/72  Pulse: (!) 134 (!) 105 77   Resp: (!) 30 20 (!) 22 19  Temp: 97.7 F (36.5 C) 98.1 F (36.7 C) 97.7 F (36.5 C) 97.7 F (36.5 C)  TempSrc: Oral Oral Oral Oral  SpO2: 99% 96% 97% 98%  Weight: 288 lb 9.6 oz (130.9 kg)     Height: 6\' 6"  (1.981 m)      Physical Exam  Constitutional: He is oriented to person, place, and time.  Obese, well-developed male sitting up in bed in no acute distress   Cardiovascular:  Irregular rhythm, nl S1/S2, no mrg  Pulmonary/Chest: Effort normal and breath sounds normal. No respiratory distress. He has no wheezes. He has no rales.  Abdominal: Soft. Bowel sounds are normal. He exhibits no distension. There is no tenderness.  Musculoskeletal: He exhibits deformity (s/p bilateral BKA). He exhibits no edema.  Neurological: He is alert and oriented to person, place, and time.    Assessment/Plan:  Principal Problem:   Persistent atrial fibrillation with rapid ventricular response (HCC) Active Problems:   ESRD on dialysis [facility])   Essential hypertension   Chest pain with moderate risk for cardiac etiology   Atrial flutter ([place])  # Chest pain  # PAF with RVR # History of atrial flutter  Patient presented to the ED after 2 episodes of pressure-like chest pain and EKG showed atrial flutter with 2:1 conduction. This is one of several admissions for the same problem. During this time, his home metoprolol dose has been increased to maximal therapy to 100 mg XL at the last admission (6/1). Cardiology consulted in the ED and Cardizem gtt + IV amiodarone started with plan for TEE + DCCV if no conversion to SR. This morning patient is doing well. Denies active chest pain. Telemetry reviewed, now  rate controlled with HR in the 70s. He is off the Cardizem gtt., only on IV amio.  - Cardiology following, appreciate recommendations  - Follow up EKG this AM  - Trend troponin  - Continue IV amiodarone  - Continue home warfarin. Pharmacy assisting as patient now on amiodarone.  - On telemetry   # HFrEF with EF 35%: Does not appear hypervolemic on exam. Will need ischemic evaluation eventually.  - Cards following  - Will continue to monitor volume status  # ESRD on HD:  - Nephrology following, appreciate assistance  - Continue home cinacalcet and Renvela   # T2DM: last A1C 7.05 September 2017.  Home regimen 30 units Lantus.  - Lantus 10 units QHS + SSI-M + CBG monitoring  - Continue home gabapentin 100 mg with HD    Dispo: Anticipated discharge in approximately 1-3 day(s).   Welford Roche, MD 06/16/2018, 8:43 AM Pager: [phone]

## 2018-06-16 NOTE — Progress Notes (Signed)
Nutrition Brief Note  Patient identified on the Malnutrition Screening Tool (MST) Report.  Wt Readings from Last 15 Encounters:  06/16/18 282 lb 13.6 oz (128.3 kg)  06/12/18 290 lb 6.4 oz (131.7 kg)  06/05/18 283 lb (128.4 kg)  05/03/18 269 lb 1.6 oz (122.1 kg)  04/06/18 255 lb (115.7 kg)  03/16/18 255 lb (115.7 kg)  03/02/18 255 lb (115.7 kg)  02/16/18 255 lb (115.7 kg)  01/15/18 255 lb 4.7 oz (115.8 kg)  12/20/17 254 lb 10.1 oz (115.5 kg)  12/11/17 257 lb 4.4 oz (116.7 kg)  12/05/17 285 lb (129.3 kg)  11/12/17 285 lb (129.3 kg)  09/24/17 270 lb (122.5 kg)  09/04/17 270 lb 4.5 oz (122.6 kg)   BMI is 34.4 kg/m2 (adjusted for BKA). Patient meets criteria for Obesity Class I based on current BMI.   Current diet order is Renal/Carbohydrate Modified, patient is consuming approximately 100% of meals at this time. Labs and medications reviewed.   No nutrition interventions warranted at this time. If nutrition issues arise, please consult RD.   Arthur Holms, RD, LDN Pager #: [phone] After-Hours Pager #: [phone]

## 2018-06-16 NOTE — Progress Notes (Signed)
[place] for Warfarin Indication: atrial fibrillation  Allergies  Allergen Reactions  . Oxycodone-Acetaminophen Other (See Comments)    Hallucinations/delirum    Patient Measurements: Height: 6\' 6"  (198.1 cm) Weight: 288 lb 9.6 oz (130.9 kg) IBW/kg (Calculated) : 91.4 Heparin Dosing Weight:   Vital Signs: Temp: 97.7 F (36.5 C) (06/18 0800) Temp Source: Oral (06/18 0800) BP: 101/72 (06/18 0800) Pulse Rate: 77 (06/18 0517)  Labs: Recent Labs    06/15/18 1216 06/15/18 2042 06/16/18 0247  HGB 10.3*  --  9.7*  HCT 31.3*  --  27.8*  PLT 219  --  235  LABPROT 25.0*  --  25.0*  INR 2.29  --  2.29  CREATININE 11.63*  --  12.91*  TROPONINI  --  1.27* 1.38*    Estimated Creatinine Clearance: 10.3 mL/min (A) (by C-G formula based on SCr of 12.91 mg/dL (H)).   Assessment: Patient came to ED with chest pain and Afib with RVR. Pharmacy consulted to dose PTA warfarin. Last dose yesterday 6/17.  INR therapeutic at 2.29. CBC stable.  No bleeding reported.   CHA2DS2-VASc = 3 (HTN, DM, CHF)  PTA warfarin: 7.5 mg daily except for Sundy takes 3.75 mg.    Goal of Therapy:  INR 2-3 Monitor platelets by anticoagulation protocol: Yes   Plan:  Repeat Warfarin 7.5 mg po x 1 Daily INR and CBC Monitor for s/sx of bleeding   Thank you Anette Guarneri, PharmD [phone]

## 2018-06-16 NOTE — Progress Notes (Signed)
Unsuccessful IV attempt L anterior forearm. Pt declined additional attempt by another RN or with ultrasound. L AC site beeping intermittently. Instructed him to keep arm as straight as possible. Pt is requesting to change meds to PO.  Pt's RN made aware.

## 2018-06-16 NOTE — Progress Notes (Addendum)
Progress Note  Patient Name: Thomas [name] Date of Encounter: 06/16/2018  Primary Cardiologist:  Fransico Him, MD  Subjective   No more CP, feels well. Will practice walking today. Had Popeyes in the room, says it is a rare indulgence and he does not overgain between HD appts.  Inpatient Medications    Scheduled Meds: . bictegravir-emtricitabine-tenofovir AF  1 tablet Oral Daily  . Chlorhexidine Gluconate Cloth  6 each Topical Q0600  . cinacalcet  90 mg Oral Q breakfast  . darbepoetin (ARANESP) injection - DIALYSIS  40 mcg Intravenous Q Tue-HD  . doxercalciferol  6 mcg Intravenous Q T,Th,Sa-HD  . feeding supplement (ENSURE ENLIVE)  237 mL Oral BID BM  . ferric citrate  840 mg Oral TID WC  . gabapentin  100 mg Oral Q T,Th,Sa-HD  . insulin aspart  0-15 Units Subcutaneous TID WC  . insulin glargine  10 Units Subcutaneous QHS  . metoprolol succinate  100 mg Oral Daily  . pantoprazole  80 mg Oral Daily  . warfarin  7.5 mg Oral ONCE-1800  . Warfarin - Pharmacist Dosing Inpatient   Does not apply q1800   Continuous Infusions: . sodium chloride    . sodium chloride    . amiodarone 30.06 mg/hr (06/16/18 1112)  . diltiazem (CARDIZEM) infusion 5 mg/hr (06/16/18 1112)   PRN Meds: sodium chloride, sodium chloride, camphor-menthol   Vital Signs    Vitals:   06/15/18 2000 06/15/18 2349 06/16/18 0517 06/16/18 0800  BP: 115/89 134/89 (!) 100/57 101/72  Pulse: (!) 134 (!) 105 77   Resp: (!) 30 20 (!) 22 19  Temp: 97.7 F (36.5 C) 98.1 F (36.7 C) 97.7 F (36.5 C) 97.7 F (36.5 C)  TempSrc: Oral Oral Oral Oral  SpO2: 99% 96% 97% 98%  Weight: 288 lb 9.6 oz (130.9 kg)     Height: 6\' 6"  (1.981 m)       Intake/Output Summary (Last 24 hours) at 06/16/2018 1120 Last data filed at 06/16/2018 1112 Gross per 24 hour  Intake 527.51 ml  Output -  Net 527.51 ml   Filed Weights   06/15/18 2000  Weight: 288 lb 9.6 oz (130.9 kg)    Telemetry    Atrial flutter vs fib>>SR approx 2  am - Personally Reviewed  ECG    SR, HR 76, 1st deg AV block 240 ms; QT/QTc 474/533 ms - Personally Reviewed  05/05 ECG SR w/ HR 96, PR 216 ms, QT/QTc 400/505 ms  Physical Exam   General: Well developed, well nourished, male appearing in no acute distress. Head: Normocephalic, atraumatic.  Neck: Supple without bruits, JVD minimal elevation. Lungs:  Resp regular and unlabored, CTA. Heart: RRR, S1, S2, no S3, S4, or murmur; no rub. Abdomen: Soft, non-tender, non-distended with normoactive bowel sounds. No hepatomegaly. No rebound/guarding. No obvious abdominal masses. Extremities: No clubbing, cyanosis, no edema. Distal upper extrem pulses are 2+ bilaterally. Neuro: Alert and oriented X 3. Moves all extremities spontaneously. Psych: Normal affect.  Labs    Hematology Recent Labs  Lab 06/15/18 1216 06/16/18 0247  WBC 8.9 9.1  RBC 3.02* 2.72*  HGB 10.3* 9.7*  HCT 31.3* 27.8*  MCV 103.6* 102.2*  MCH 34.1* 35.7*  MCHC 32.9 34.9  RDW 15.8* 15.8*  PLT 219 235    Chemistry Recent Labs  Lab 06/15/18 1216 06/16/18 0247  NA 138 137  K 4.1 4.2  CL 97* 99*  CO2 23 17*  GLUCOSE 297* 250*  BUN 62* 78*  CREATININE 11.63* 12.91*  CALCIUM 9.1 8.3*  ALBUMIN  --  3.5  GFRNONAA 4* 4*  GFRAA 5* 4*  ANIONGAP 18* 21*     Cardiac Enzymes Recent Labs  Lab 06/15/18 2042 06/16/18 0247  TROPONINI 1.27* 1.38*    Recent Labs  Lab 06/15/18 1240  TROPIPOC 0.74*    Lab Results  Component Value Date   INR 2.29 06/16/2018   INR 2.29 06/15/2018   INR 1.8 (A) 06/10/2018     Radiology    Dg Chest Port 1 View  Result Date: 06/15/2018 CLINICAL DATA:  Chest pain.  Atrial fibrillation. EXAM: PORTABLE CHEST 1 VIEW COMPARISON:  06/04/2018 and 05/02/2018 and 12/05/2017 FINDINGS: Heart size and pulmonary vascularity are normal. Lungs are clear except for a tiny area of linear scarring in the left midzone. No bone abnormality. IMPRESSION: No active disease. Electronically Signed   By:  Lorriane Shire M.D.   On: 06/15/2018 13:17     Cardiac Studies   None ordered  Patient Profile     52 y.o. male w/ hx HIV, DM, ESRD on HD, HTN, hx atrial fib on coumadin, biilat BKA, secondary hyperparathyroidism, anemia, chronic combined CHF, was admitted 06/17 w/ rapid atrial fib.  Assessment & Plan     Principal Problem: 1.  Persistent atrial fibrillation with rapid ventricular response (HCC) - spontaneously converted to SR approx 2 am - on IV amio at 30 mg/hr>>change to po at 400 mg bid x 7 days, then 200 mg qd, follow QT/QTc - continue IV Cardizem 5 mg/hr for now, hopefully can d/c after BB on board -  Home Toprol XL 100 mg qd not ordered on admission, restart now  2.  Chest pain with moderate risk for cardiac etiology - sx resolved when converted to SR - ez are elevated and hx EF 35-40% - needs ischemic eval, HR may be low enough for CT once BB given, will order - he ate a small amount, asked him not to eat any more.   Otherwise, per IM Active Problems:   ESRD on dialysis [facility]) - today is his usual HD day, need to coordinate any testing with them   Essential hypertension   Atrial flutter ([place])    Signed, Rosaria Ferries , PA-C 11:20 AM 06/16/2018 Pager: [phone]  I have seen and examined the patient along with Rosaria Ferries , PA-C.  I have reviewed the chart, notes and new data.  I agree with PA/NP's note.  Key new complaints: He denies dyspnea, but when I went into the room he was a little tachypneic, albeit lying completely flat on the bed.  Feels better than yesterday.  Told me today that he has previously had an ablation procedure in [place], probably in 2017.  Does not know the details of the procedure, but it was performed at [place] examination changes: Atrial flutter stopped around 2 AM.  He has been in sinus rhythm with frequent PACs and frequent PVCs since then. Key new findings / data: Cardiac troponin peaked at approximately 1.4  and is decreasing.  PLAN: Hemodialysis today. Plan coronary angiography, femoral approach. This procedure and possible PCI/stent has been fully reviewed with the patient and written informed consent has been obtained. Have to wait for his anticoagulation to wear off to an INR of approximately 1.5. Unfortunately, its very possible that we will find that he has multivessel coronary artery disease.  Even though he had bilateral below the knee amputations, he ambulates with prostheses  and may not be at completely prohibitive risk for bypass surgery.  Obviously high risk also due to ESRD. To continue amiodarone p.o. after he completes a 24-hour load today.  Resume anticoagulation after cardiac catheterization.   Sanda Klein, MD, [place] [phone] 06/16/2018, 1:34 PM

## 2018-06-17 ENCOUNTER — Ambulatory Visit: Payer: Medicare Other | Admitting: Physical Therapy

## 2018-06-17 LAB — BASIC METABOLIC PANEL WITH GFR
Anion gap: 14 (ref 5–15)
BUN: 45 mg/dL — ABNORMAL HIGH (ref 6–20)
CO2: 25 mmol/L (ref 22–32)
Calcium: 8.6 mg/dL — ABNORMAL LOW (ref 8.9–10.3)
Chloride: 98 mmol/L — ABNORMAL LOW (ref 101–111)
Creatinine, Ser: 9.04 mg/dL — ABNORMAL HIGH (ref 0.61–1.24)
GFR calc Af Amer: 7 mL/min — ABNORMAL LOW
GFR calc non Af Amer: 6 mL/min — ABNORMAL LOW
Glucose, Bld: 340 mg/dL — ABNORMAL HIGH (ref 65–99)
Potassium: 4 mmol/L (ref 3.5–5.1)
Sodium: 137 mmol/L (ref 135–145)

## 2018-06-17 LAB — MAGNESIUM: Magnesium: 2.2 mg/dL (ref 1.7–2.4)

## 2018-06-17 LAB — CBC
HCT: 28.2 % — ABNORMAL LOW (ref 39.0–52.0)
Hemoglobin: 9.4 g/dL — ABNORMAL LOW (ref 13.0–17.0)
MCH: 34.7 pg — ABNORMAL HIGH (ref 26.0–34.0)
MCHC: 33.3 g/dL (ref 30.0–36.0)
MCV: 104.1 fL — ABNORMAL HIGH (ref 78.0–100.0)
Platelets: 207 K/uL (ref 150–400)
RBC: 2.71 MIL/uL — ABNORMAL LOW (ref 4.22–5.81)
RDW: 16 % — ABNORMAL HIGH (ref 11.5–15.5)
WBC: 8.7 K/uL (ref 4.0–10.5)

## 2018-06-17 LAB — GLUCOSE, CAPILLARY
GLUCOSE-CAPILLARY: 271 mg/dL — AB (ref 65–99)
GLUCOSE-CAPILLARY: 272 mg/dL — AB (ref 65–99)
Glucose-Capillary: 318 mg/dL — ABNORMAL HIGH (ref 65–99)
Glucose-Capillary: 219 mg/dL — ABNORMAL HIGH (ref 65–99)
Glucose-Capillary: 197 mg/dL — ABNORMAL HIGH (ref 65–99)

## 2018-06-17 LAB — PROTIME-INR
INR: 2.17
Prothrombin Time: 24 s — ABNORMAL HIGH (ref 11.4–15.2)

## 2018-06-17 MED ORDER — ACETAMINOPHEN 325 MG PO TABS
650.0000 mg | ORAL_TABLET | Freq: Four times a day (QID) | ORAL | Status: DC | PRN
  Administered 2018-06-17 – 2018-06-19 (×3): 650 mg via ORAL
  Filled 2018-06-17 (×3): qty 2

## 2018-06-17 MED ORDER — PHYTONADIONE 5 MG PO TABS
2.5000 mg | ORAL_TABLET | Freq: Once | ORAL | Status: AC
  Administered 2018-06-17: 2.5 mg via ORAL
  Filled 2018-06-17: qty 1

## 2018-06-17 NOTE — Progress Notes (Addendum)
Progress Note  Patient Name: Thomas [name] Date of Encounter: 06/17/2018  Primary Cardiologist: Fransico Him, MD  Subjective   No CP or SOB. Generally doesn't feel well this AM. Has a headache. No focal changes.  Inpatient Medications    Scheduled Meds: . amiodarone  400 mg Oral BID  . bictegravir-emtricitabine-tenofovir AF  1 tablet Oral Daily  . Chlorhexidine Gluconate Cloth  6 each Topical Q0600  . cinacalcet  90 mg Oral Q breakfast  . darbepoetin (ARANESP) injection - DIALYSIS  40 mcg Intravenous Q Tue-HD  . doxercalciferol  6 mcg Intravenous Q T,Th,Sa-HD  . feeding supplement (ENSURE ENLIVE)  237 mL Oral BID BM  . ferric citrate  840 mg Oral TID WC  . gabapentin  100 mg Oral Q T,Th,Sa-HD  . insulin aspart  0-15 Units Subcutaneous TID WC  . insulin glargine  10 Units Subcutaneous QHS  . metoprolol succinate  100 mg Oral Daily  . pantoprazole  80 mg Oral Daily   Continuous Infusions:  PRN Meds: camphor-menthol   Vital Signs    Vitals:   06/17/18 0008 06/17/18 0405 06/17/18 0423 06/17/18 0756  BP: 135/78 (!) 150/81  (!) 148/75  Pulse: 90 87  85  Resp: 17 19  (!) 22  Temp: 98.9 F (37.2 C) 98.2 F (36.8 C)  98.6 F (37 C)  TempSrc: Oral Oral  Oral  SpO2: 94% 98%    Weight:   282 lb 12.8 oz (128.3 kg)   Height:        Intake/Output Summary (Last 24 hours) at 06/17/2018 1103 Last data filed at 06/16/2018 1708 Gross per 24 hour  Intake 260.1 ml  Output 4647 ml  Net -4386.9 ml   Filed Weights   06/16/18 1300 06/16/18 1708 06/17/18 0423  Weight: 282 lb 13.6 oz (128.3 kg) 272 lb 0.8 oz (123.4 kg) 282 lb 12.8 oz (128.3 kg)    Telemetry    NSR, 6 beats NSVT, OCC pacS- Personally Reviewed  Physical Exam   GEN: No acute distress  HEENT: Normocephalic, atraumatic, sclera non-icteric. Neck: No JVD or bruits. Cardiac: RRR no murmurs, rubs, or gallops.  Radials/DP/PT 1+ and equal bilaterally.  Respiratory: Clear to auscultation bilaterally. Breathing is  unlabored. GI: Soft, nontender, non-distended, BS +x 4. MS: Bilat BKA. Extremities: No clubbing or cyanosis. No UE edema. S/p bilateral BKA. Neuro:  AAOx3. Follows commands. Psych:  Responds to questions appropriately with a normal albeit somewhat reserved affect.  Labs    Chemistry Recent Labs  Lab 06/15/18 1216 06/16/18 0247 06/17/18 0249  NA 138 137 137  K 4.1 4.2 4.0  CL 97* 99* 98*  CO2 23 17* 25  GLUCOSE 297* 250* 340*  BUN 62* 78* 45*  CREATININE 11.63* 12.91* 9.04*  CALCIUM 9.1 8.3* 8.6*  ALBUMIN  --  3.5  --   GFRNONAA 4* 4* 6*  GFRAA 5* 4* 7*  ANIONGAP 18* 21* 14     Hematology Recent Labs  Lab 06/15/18 1216 06/16/18 0247 06/17/18 0249  WBC 8.9 9.1 8.7  RBC 3.02* 2.72* 2.71*  HGB 10.3* 9.7* 9.4*  HCT 31.3* 27.8* 28.2*  MCV 103.6* 102.2* 104.1*  MCH 34.1* 35.7* 34.7*  MCHC 32.9 34.9 33.3  RDW 15.8* 15.8* 16.0*  PLT 219 235 207    Cardiac Enzymes Recent Labs  Lab 06/15/18 2042 06/16/18 0247 06/16/18 1053  TROPONINI 1.27* 1.38* 0.99*    Recent Labs  Lab 06/15/18 1240  TROPIPOC 0.74*    Radiology  Dg Chest Port 1 View  Result Date: 06/15/2018 CLINICAL DATA:  Chest pain.  Atrial fibrillation. EXAM: PORTABLE CHEST 1 VIEW COMPARISON:  06/04/2018 and 05/02/2018 and 12/05/2017 FINDINGS: Heart size and pulmonary vascularity are normal. Lungs are clear except for a tiny area of linear scarring in the left midzone. No bone abnormality. IMPRESSION: No active disease. Electronically Signed   By: Lorriane Shire M.D.   On: 06/15/2018 13:17   Patient Profile     52 y.o. male with history of HIV, DM, ESRD on HD, HTN, arhythmia (questionable prior atrial fib or flutter or SVT in [place], with atrial flutter in our system), L BKA, secondary hyperparathyroidism, anemia, chronic combined CHF. He established care with Dr. [name] 02/2017. Prior cardiac care was in [place]. He had some type of SVT and had what sounds like from his description as afib or flutter and had a  TEE/DCCV and about 2 weeks later had reoccurrence of the arrhythmia and had an ablation. He had been on chronic anticoagulation with warfarin. He was seen in the office 11/2017 to re-establish care with cardiology and get someone to follow his coumadin but ended up being admitted for leg ulcer infection. He was again seen in the hospital 04/2017 for chest pain and acute heart failure. Troponin was elevated in flat trend up to peak 1.07 felt related to CHF/CKD. 2D echo at that time showed mod LVH, EF 35-40%, akinesis of the inferior myocardium, grade 2 DD, mild AI/MR, severely dilated LA, mildly dilated RA. Records from [place] were requested but what we received from 2013 and 2016 were unhelpful and vague. Has had multiple recent admissions in our system - 04/2018 for reported rapid atrial fib, 05/2018 for chest pain/rapid atrial fib, and now recurrent chest pain, SOB, elevated troponin and atrial flutter with RVR -> Dr. Sallyanne Kuster reviewed EKGs and felt none of them showed atrial fib, only atrial flutter.  Assessment & Plan    1. Persistent recurrent atrial flutter - recent TSH wnl. Now loading with amiodarone. Maintaining NSR. Coumadin on hold in prep for cath.  2. Recurrent chest pain/elevated troponin - there was talk of cath vs cardiac CT this admission. Given comorbidities, cardiomyopathy and degree of troponin elevation, seems cath is indicated. IM has given vitamin K this AM. F/u INR in AM. Plan cath when INR 1.5 or less per Dr. Sallyanne Kuster. Risks and benefits of cardiac catheterization have been discussed with the patient.  These include bleeding, infection, kidney damage, stroke, heart attack, death.  The patient understands these risks and is willing to proceed. Will make NPO after midnight and reassess in AM. Wrote care order for nursing to coordinate with IM regarding adjustment of his insulin regimen since he will be NPO after midnight. Pt aware it may be Thurs or Fri contingent on INR. If any delays in  need for cath when INR falls below 2 may need to consider heparin. Will discuss initiation of ASA with MD. May also benefit from statin therapy; will check lipid profile in AM - would appreciate IM input on appropriate statin choices given his HIV regimen.  3. ESRD on HD - per renal.  4. HIV - per IM.  5. Chronic combined CHF - volume managed with HD. Continue Toprol. Not sure ACEI/ARB/spiro would be useful in setting of his ESRD. There are some low blood pressures documented as well, so would hold off on titration of regimen for now.  6. Anemia - per primary team.  For questions or updates, please  contact [place] Please consult [email or web address] for contact info under Cardiology/STEMI.  Signed, Charlie Pitter, PA-C 06/17/2018, 11:03 AM    I have seen and examined the patient along with Charlie Pitter, PA-C.  I have reviewed the chart, notes and new data.  I agree with PA/NP's note.  Key new complaints: Mood is a little depressed today, but other than a mild headache denies any somatic complaints.  Specifically no angina or dyspnea. Key examination changes: No longer has the signs of hypervolemia that were present before dialysis yesterday.  Looks very comfortable lying flat.  No JVD. Key new findings / data: INR 2.17 this morning.  No recurrence of atrial fibrillation on telemetry.  PLAN: He needs cardiac catheterization and femoral approach is the appropriate one since he needs uninjured radial arteries for dialysis access.  To wait for INR to be 1.5 or less due to the increased bleeding risk.  Hopefully we can get this done tomorrow, if not I am confident by Friday he can have coronary angiography.  Resume anticoagulation as soon as possible after the procedure.  Plan to continue amiodarone p.o.  Anticoagulation will be rather unpredictable over the next several weeks since he received vitamin K and will expect amiodarone interaction.  Will need more frequent prothrombin time  measurements. Recommend initiation of aspirin prior to his catheterization.  If he does not require revascularization, would stop the aspirin due to the increased bleeding risk on oral anticoagulants.  If percutaneous revascularization and stenting is performed, will require "triple therapy" for 30 days, then warfarin and Plavix only thereafter.  Sanda Klein, MD, [place] [phone] 06/17/2018, 12:28 PM

## 2018-06-17 NOTE — Progress Notes (Signed)
Inpatient Diabetes Program Recommendations  AACE/ADA: New Consensus Statement on Inpatient Glycemic Control (2019)  Target Ranges:  Prepandial:   less than 140 mg/dL      Peak postprandial:   less than 180 mg/dL (1-2 hours)      Critically ill patients:  140 - 180 mg/dL  Results for CULLY, LUCKOW (MRN [id number]) as of 06/17/2018 09:39  Ref. Range 06/16/2018 06:06 06/16/2018 11:21 06/16/2018 17:49 06/16/2018 21:09 06/17/2018 06:40  Glucose-Capillary Latest Ref Range: 65 - 99 mg/dL 232 (H)  Novolog 5 units 200 (H)  Novolog 3 units 221 (H)  Novolog 5 units 318 (H)  Lantus 10 units 271 (H)  Novolog 8 units    Review of Glycemic Control  Diabetes history: DM2 Outpatient Diabetes medications: Lantus 20 units QHS Current orders for Inpatient glycemic control: Lantus 10 units daily, Novolog 0-15 units TID with meals  Inpatient Diabetes Program Recommendations: Insulin - Basal: Please consider increasing Lantus to 20 units daily. Correction (SSI): Please consider ordering Novolog 0-5 units QHS for bedtime correction scale.  Thanks, Barnie Alderman, RN, MSN, CDE Diabetes Coordinator Inpatient Diabetes Program [phone] (Team Pager from 8am to 5pm)

## 2018-06-17 NOTE — Progress Notes (Signed)
   Subjective:  No acute events overnight. Denies chest pain and SOB. No other complaints this morning. Discussed with patient waiting for decrease in INR for LHC.   Objective:  Vital signs in last 24 hours: Vitals:   06/17/18 0008 06/17/18 0405 06/17/18 0423 06/17/18 0756  BP: 135/78 (!) 150/81  (!) 148/75  Pulse: 90 87  85  Resp: 17 19  (!) 22  Temp: 98.9 F (37.2 C) 98.2 F (36.8 C)  98.6 F (37 C)  TempSrc: Oral Oral  Oral  SpO2: 94% 98%    Weight:   282 lb 12.8 oz (128.3 kg)   Height:       Physical Exam  Constitutional: He is oriented to person, place, and time.  Obese male, well-developed, sitting up in  Bed in no acute distress   Cardiovascular: Normal rate, regular rhythm and normal heart sounds. Exam reveals no gallop and no friction rub.  No murmur heard. Pulmonary/Chest: Effort normal. No respiratory distress. He has no wheezes. He has no rales.  Abdominal: Soft. Bowel sounds are normal.  Musculoskeletal: He exhibits deformity (bilateral BKAs). He exhibits no edema.  Neurological: He is alert and oriented to person, place, and time.    Assessment/Plan:  Principal Problem:   Persistent atrial fibrillation with rapid ventricular response (HCC) Active Problems:   ESRD on dialysis [facility])   Essential hypertension   Chest pain with moderate risk for cardiac etiology   Atrial flutter (HCC)   Non-ST elevation (NSTEMI) myocardial infarction [facility])  # Chest pain secondary to atrial flutter with RVR  # History of pAF  Patient is doing well and has no complaints this morning. Denies chest pain and shortness of breath. Telemetry reviewed. One episode of NSVT noted otherwise NSR. Off Cardizem gtt. Switched to PO metop and amiodarone per cards recommendations. Will continue to monitor.  - Cardiology following, appreciate recommendations  - Continue PO metoprolol XL 100 mg QD and amiodarone 400 mg BID   - Hol[place] home warfarin. Aiming for INR < 1.5 for LHC, giving vitamin K  2.5 mg x1. Hopefully tomorrow.  - On telemetry   # HFrEF with EF 35%: Does not appear hypervolemic on exam. - Cards following, plan for LHC when INR < 1.5. INR 2.1 this AM. Will give 2.5 mg vitamin K x1.  - Will continue to monitor volume status  # ESRD on HD:  - Nephrology following, appreciate assistance  - Continue home cinacalcet and Renvela  - Starting Aranesp   # T2DM: last A1C 7.05 September 2017. Home regimen 30 units Lantus. CBGs 200-300. Will not increase Lantus today as he may be NPO at [place] for [facility].  - Lantus 10 units QHS + SSI-M + CBG monitoring  - Continue home gabapentin 100 mg with HD    Dispo: Anticipated discharge in approximately 1-3 day(s) pending LHC.   Welford Roche, MD 06/17/2018, 8:38 AM Pager: [phone]

## 2018-06-17 NOTE — Progress Notes (Signed)
Internal Medicine Attending:   I saw and examined the patient. I reviewed the resident's note and I agree with the resident's findings and plan as documented in the resident's note.  Patient feels well today with no new complaints.  Patient was initially admitted with chest pain secondary to a flutter with RVR and converted to normal sinus rhythm on amiodarone and Cardizem drips.  We will continue with oral metoprolol and amiodarone for now.  Given patient's chest pain as well as multiple risk factors and mild troponin elevation he will undergo an ischemic evaluation with coronary cath once his INR is less than 1.5.  Low-dose vitamin K 2.5 mg given today.  Hopefully patient will be able to go for his left heart cath tomorrow or latest by Friday.  Continue with hemodialysis per nephrology.  No further work-up for now.

## 2018-06-17 NOTE — Progress Notes (Addendum)
I have seen and examined this patient and agree with the plan of care  Awaiting cath  [facility] 06/17/2018, 10:52 AM  [facility] Progress Note   Subjective:  Seen in room. No CP/dyspnea today. No palpitations with HD yesterday, did have mild cramping at end of HD, now resolved. Plan is for cardiac cath once INR down, ?possibly tomorrow.  Objective Vitals:   06/17/18 0008 06/17/18 0405 06/17/18 0423 06/17/18 0756  BP: 135/78 (!) 150/81  (!) 148/75  Pulse: 90 87  85  Resp: 17 19  (!) 22  Temp: 98.9 F (37.2 C) 98.2 F (36.8 C)  98.6 F (37 C)  TempSrc: Oral Oral  Oral  SpO2: 94% 98%    Weight:   128.3 kg (282 lb 12.8 oz)   Height:       Physical Exam General: Well appearing man, NAD. Heart: RRR; no murmur Lungs: CTAB Abdomen: Obese, non-tender  Extremities: B BKA; no stump edema Dialysis Access: R forearm AVF + thrill  Additional Objective Labs: Basic Metabolic Panel: Recent Labs  Lab 06/15/18 1216 06/16/18 0247 06/17/18 0249  NA 138 137 137  K 4.1 4.2 4.0  CL 97* 99* 98*  CO2 23 17* 25  GLUCOSE 297* 250* 340*  BUN 62* 78* 45*  CREATININE 11.63* 12.91* 9.04*  CALCIUM 9.1 8.3* 8.6*  PHOS  --  8.4*  --    Liver Function Tests: Recent Labs  Lab 06/16/18 0247  ALBUMIN 3.5   CBC: Recent Labs  Lab 06/15/18 1216 06/16/18 0247 06/17/18 0249  WBC 8.9 9.1 8.7  HGB 10.3* 9.7* 9.4*  HCT 31.3* 27.8* 28.2*  MCV 103.6* 102.2* 104.1*  PLT 219 235 207   Cardiac Enzymes: Recent Labs  Lab 06/15/18 2042 06/16/18 0247 06/16/18 1053  TROPONINI 1.27* 1.38* 0.99*   CBG: Recent Labs  Lab 06/16/18 0606 06/16/18 1121 06/16/18 1749 06/16/18 2109 06/17/18 0640  GLUCAP 232* 200* 221* 318* 271*   Studies/Results: Dg Chest Port 1 View  Result Date: 06/15/2018 CLINICAL DATA:  Chest pain.  Atrial fibrillation. EXAM: PORTABLE CHEST 1 VIEW COMPARISON:  06/04/2018 and 05/02/2018 and 12/05/2017 FINDINGS: Heart size and pulmonary vascularity are  normal. Lungs are clear except for a tiny area of linear scarring in the left midzone. No bone abnormality. IMPRESSION: No active disease. Electronically Signed   By: Lorriane Shire M.D.   On: 06/15/2018 13:17   Medications:  . amiodarone  400 mg Oral BID  . bictegravir-emtricitabine-tenofovir AF  1 tablet Oral Daily  . Chlorhexidine Gluconate Cloth  6 each Topical Q0600  . cinacalcet  90 mg Oral Q breakfast  . darbepoetin (ARANESP) injection - DIALYSIS  40 mcg Intravenous Q Tue-HD  . doxercalciferol  6 mcg Intravenous Q T,Th,Sa-HD  . feeding supplement (ENSURE ENLIVE)  237 mL Oral BID BM  . ferric citrate  840 mg Oral TID WC  . gabapentin  100 mg Oral Q T,Th,Sa-HD  . insulin aspart  0-15 Units Subcutaneous TID WC  . insulin glargine  10 Units Subcutaneous QHS  . metoprolol succinate  100 mg Oral Daily  . pantoprazole  80 mg Oral Daily    Dialysis Orders: TTS at [facility] 4:15hr, 3K/2.25Ca, BFR 500, DFR 800, EDW 120kg, AVF, no heparin (on warfarin) - Hectoral 88mcg IV q HD - No venofer, ESA - Binder: Auryxia 4/meals.  Assessment/Plan: 1.  A-flutter w/ 2:1 block: Converted to NSR with diltiazem and amiodarone drips, cardioversion cancelled. ^ troponin; for ischemic evaluation with coronary  angiogram this week, ?tomorrow (once INR < 1.5) 2.  ESRD: Will continue HD per usual TTS schedule, next 6/20. Can work around procedure. 3.  Hypertension/volume: BP variable, s/p 4.6L UF with HD yesterday. Fluids disc - remains well over EDW. Will try to get back to EDW with next HD. 4.  Anemia: Hgb 9.4. Not on ESA as outpatient, started Aranesp 28mcg weekly (given 6/18). 5.  Metabolic bone disease: Ca ok, Phos high. Continue Auryxia, Sensipar, Hectoral. 6.  Type 2 DM: per primary. 7.  HIV: per primary. 8.  Prior known A-fib (on warfarin and metoprolol): S/p recurrent admits for RVR.   Veneta Penton, PA-C 06/17/2018, 9:42 AM  [facility] Pager: [phone]

## 2018-06-18 ENCOUNTER — Encounter (HOSPITAL_COMMUNITY)
Admission: EM | Disposition: A | Discharge status: Home / Self Care | Payer: Self-pay | Source: Home / Self Care | Attending: Internal Medicine

## 2018-06-18 DIAGNOSIS — I2510 Atherosclerotic heart disease of native coronary artery without angina pectoris: Secondary | ICD-10-CM

## 2018-06-18 HISTORY — PX: LEFT HEART CATH AND CORONARY ANGIOGRAPHY: CATH118249

## 2018-06-18 LAB — CBC
HEMATOCRIT: 27.4 % — AB (ref 39.0–52.0)
Hemoglobin: 9 g/dL — ABNORMAL LOW (ref 13.0–17.0)
MCH: 34.1 pg — ABNORMAL HIGH (ref 26.0–34.0)
MCHC: 32.8 g/dL (ref 30.0–36.0)
MCV: 103.8 fL — ABNORMAL HIGH (ref 78.0–100.0)
PLATELETS: 181 10*3/uL (ref 150–400)
RBC: 2.64 MIL/uL — ABNORMAL LOW (ref 4.22–5.81)
RDW: 15.6 % — AB (ref 11.5–15.5)
WBC: 8.6 10*3/uL (ref 4.0–10.5)

## 2018-06-18 LAB — BASIC METABOLIC PANEL
Anion gap: 18 — ABNORMAL HIGH (ref 5–15)
BUN: 70 mg/dL — ABNORMAL HIGH (ref 6–20)
CALCIUM: 8.8 mg/dL — AB (ref 8.9–10.3)
CO2: 22 mmol/L (ref 22–32)
CREATININE: 11.45 mg/dL — AB (ref 0.61–1.24)
Chloride: 98 mmol/L — ABNORMAL LOW (ref 101–111)
GFR, EST AFRICAN AMERICAN: 5 mL/min — AB (ref >60)
GFR, EST NON AFRICAN AMERICAN: 4 mL/min — AB (ref >60)
GLUCOSE: 206 mg/dL — AB (ref 65–99)
Potassium: 4.1 mmol/L (ref 3.5–5.1)
Sodium: 138 mmol/L (ref 135–145)

## 2018-06-18 LAB — GLUCOSE, CAPILLARY
GLUCOSE-CAPILLARY: 215 mg/dL — AB (ref 65–99)
GLUCOSE-CAPILLARY: 186 mg/dL — AB (ref 65–99)
GLUCOSE-CAPILLARY: 232 mg/dL — AB (ref 65–99)
Glucose-Capillary: 199 mg/dL — ABNORMAL HIGH (ref 65–99)
Glucose-Capillary: 170 mg/dL — ABNORMAL HIGH (ref 65–99)

## 2018-06-18 LAB — LIPID PANEL
Cholesterol: 202 mg/dL — ABNORMAL HIGH (ref 0–200)
HDL: 20 mg/dL — ABNORMAL LOW (ref >40)
LDL CALC: UNDETERMINED mg/dL (ref 0–99)
TRIGLYCERIDES: 596 mg/dL — AB (ref <150)
Total CHOL/HDL Ratio: 10.1 RATIO
VLDL: UNDETERMINED mg/dL (ref 0–40)

## 2018-06-18 LAB — PROTIME-INR
INR: 1.6
INR: 1.33
Prothrombin Time: 18.9 seconds — ABNORMAL HIGH (ref 11.4–15.2)
Prothrombin Time: 16.3 seconds — ABNORMAL HIGH (ref 11.4–15.2)

## 2018-06-18 SURGERY — LEFT HEART CATH AND CORONARY ANGIOGRAPHY
Anesthesia: LOCAL

## 2018-06-18 MED ORDER — MIDAZOLAM HCL 2 MG/2ML IJ SOLN
INTRAMUSCULAR | Status: DC | PRN
  Administered 2018-06-18: 1 mg via INTRAVENOUS

## 2018-06-18 MED ORDER — HEPARIN (PORCINE) IN NACL 1000-0.9 UT/500ML-% IV SOLN
INTRAVENOUS | Status: AC
  Filled 2018-06-18: qty 1000

## 2018-06-18 MED ORDER — IOPAMIDOL (ISOVUE-370) INJECTION 76%
INTRAVENOUS | Status: AC
  Filled 2018-06-18: qty 50

## 2018-06-18 MED ORDER — IOPAMIDOL (ISOVUE-370) INJECTION 76%
INTRAVENOUS | Status: DC | PRN
  Administered 2018-06-18: 110 mL via INTRA_ARTERIAL

## 2018-06-18 MED ORDER — FENTANYL CITRATE (PF) 100 MCG/2ML IJ SOLN
INTRAMUSCULAR | Status: AC
  Filled 2018-06-18: qty 2

## 2018-06-18 MED ORDER — MIDAZOLAM HCL 2 MG/2ML IJ SOLN
INTRAMUSCULAR | Status: AC
  Filled 2018-06-18: qty 2

## 2018-06-18 MED ORDER — SODIUM CHLORIDE 0.9 % IV SOLN
INTRAVENOUS | Status: DC
  Administered 2018-06-18: 12:00:00 via INTRAVENOUS

## 2018-06-18 MED ORDER — SODIUM CHLORIDE 0.9% FLUSH: 3.0000 mL | Freq: Two times a day (BID) | INTRAVENOUS | Status: DC

## 2018-06-18 MED ORDER — SODIUM CHLORIDE 0.9 % IV SOLN: 250.0000 mL | INTRAVENOUS | Status: DC | PRN

## 2018-06-18 MED ORDER — SODIUM CHLORIDE 0.9% FLUSH
3.0000 mL | Freq: Two times a day (BID) | INTRAVENOUS | Status: DC
  Administered 2018-06-18 – 2018-06-19 (×2): 3 mL via INTRAVENOUS

## 2018-06-18 MED ORDER — HEPARIN (PORCINE) IN NACL 2-0.9 UNITS/ML
INTRAMUSCULAR | Status: AC | PRN
  Administered 2018-06-18: 1000 mL

## 2018-06-18 MED ORDER — LIDOCAINE HCL (PF) 1 % IJ SOLN
INTRAMUSCULAR | Status: AC
  Filled 2018-06-18: qty 30

## 2018-06-18 MED ORDER — LIDOCAINE HCL (PF) 1 % IJ SOLN
INTRAMUSCULAR | Status: DC | PRN
  Administered 2018-06-18: 18 mL

## 2018-06-18 MED ORDER — SODIUM CHLORIDE 0.9% FLUSH: 3.0000 mL | INTRAVENOUS | Status: DC | PRN

## 2018-06-18 MED ORDER — DOXERCALCIFEROL 4 MCG/2ML IV SOLN
INTRAVENOUS | Status: AC
  Administered 2018-06-18: 6 ug
  Filled 2018-06-18: qty 4

## 2018-06-18 MED ORDER — ASPIRIN 81 MG PO CHEW
81.0000 mg | CHEWABLE_TABLET | ORAL | Status: AC
  Administered 2018-06-18: 81 mg via ORAL
  Filled 2018-06-18: qty 1

## 2018-06-18 MED ORDER — FENTANYL CITRATE (PF) 100 MCG/2ML IJ SOLN
INTRAMUSCULAR | Status: DC | PRN
  Administered 2018-06-18: 25 ug via INTRAVENOUS

## 2018-06-18 MED ORDER — IOPAMIDOL (ISOVUE-370) INJECTION 76%
INTRAVENOUS | Status: AC
  Filled 2018-06-18: qty 100

## 2018-06-18 SURGICAL SUPPLY — 10 items
CATH INFINITI 5FR JL5 (CATHETERS) ×2 IMPLANT
CATH INFINITI 5FR MULTPACK ANG (CATHETERS) ×2 IMPLANT
COVER PRB 48X5XTLSCP FOLD TPE (BAG) ×1 IMPLANT
COVER PROBE 5X48 (BAG) ×1
KIT HEART LEFT (KITS) ×2 IMPLANT
PACK CARDIAC CATHETERIZATION (CUSTOM PROCEDURE TRAY) ×2 IMPLANT
SHEATH PINNACLE 5F 10CM (SHEATH) ×2 IMPLANT
SYR MEDRAD MARK V 150ML (SYRINGE) ×2 IMPLANT
TRANSDUCER W/STOPCOCK (MISCELLANEOUS) ×2 IMPLANT
WIRE EMERALD 3MM-J .035X150CM (WIRE) ×2 IMPLANT

## 2018-06-18 NOTE — Progress Notes (Addendum)
   Subjective:  No acute events overnight.  Patient seen during hemodialysis session and doing well.  Reported no complaints.  Objective:  Vital signs in last 24 hours: Vitals:   06/17/18 1643 06/17/18 2141 06/18/18 0025 06/18/18 0624  BP: 117/70 (!) 143/76 (!) 111/98 (!) 135/54  Pulse: 81 83 84 80  Resp:  (!) 22 19 (!) 26  Temp: 97.8 F (36.6 C) 98.2 F (36.8 C) 98.4 F (36.9 C) 97.9 F (36.6 C)  TempSrc: Oral Oral Oral Oral  SpO2:  96% 95% 96%  Weight:      Height:       Physical Exam  Constitutional: He is oriented to person, place, and time.  Obese male sitting up in bed in no acute distress  Cardiovascular: Normal rate, regular rhythm and normal heart sounds. Exam reveals no gallop and no friction rub.  No murmur heard. Pulmonary/Chest: Effort normal and breath sounds normal. No respiratory distress. He has no wheezes. He has no rales.  Musculoskeletal: He exhibits deformity (s/p bilateral BKA's). He exhibits no edema.  Neurological: He is alert and oriented to person, place, and time.    Assessment/Plan:  Principal Problem:   Persistent atrial fibrillation with rapid ventricular response (HCC) Active Problems:   ESRD on dialysis [facility])   Essential hypertension   Chest pain with moderate risk for cardiac etiology   Atrial flutter (HCC)   Non-ST elevation (NSTEMI) myocardial infarction [facility])  # Chest painsecondary to atrial flutter with RVR  # History of pAF  Patient is doing well and has no complaints this morning. Denies chest pain and shortness of breath. On PO metop and amiodarone per cards recommendations. Will continue to monitor.  - Cardiology following, appreciate recommendations  -Continue PO metoprolol XL 100 mg QD and amiodarone 400 mg BID   - Holfing home warfarin. Aiming for INR < 1.5 for LHC - On telemetry  # HFrEF with EF 35%: Does not appear hypervolemic on exam. Plan for ischemic evaluation with LHC possibly today, if not likely tomorrow.  -  Cards following, plan for LHC when INR < 1.5. INR 1.6 this AM.  - Will continue to monitor volume status  # Hyperlipidemia: Cholesterol 202, TG 596, HDL 20. No LDL calculated due to TG >400. - Consider starting high intensity statin   # ESRD on HD:  - Nephrology following, appreciate assistance - Continue home cinacalcet and Renvela - Starting Aranesp   # T2DM:last A1C 7.05 September 2017. Home regimen 30 unitsLantus. No Lantus given overnight as patient is NPO at this time for possible LHC. Will give in AM if no LHC.  - Lantus 10 units QHS + SSI-M + CBG monitoring  - Continue home gabapentin 100 mg with HD   Dispo: Anticipated discharge in approximately 1-3 day(s) pending LHC.   Welford Roche, MD 06/18/2018, 7:44 AM Pager: [phone]

## 2018-06-18 NOTE — Progress Notes (Signed)
Site area: Right groin a 5 french arterial sheath was removed  Site Prior to Removal:  Level 0  Pressure Applied For 15 MINUTES    Bedrest Beginning at  1840p  Manual:   Yes.    Patient Status During Pull:  stab;e  Post Pull Groin Site:  Level 0  Post Pull Instructions Given:  Yes.    Post Pull Pulses Present:  Yes.    Dressing Applied:  Yes.    Comments:

## 2018-06-18 NOTE — Progress Notes (Signed)
Internal Medicine Attending:   I saw and examined the patient. I reviewed the resident's note and I agree with the resident's findings and plan as documented in the resident's note.  Patient feels well today with no new complaints.  Patient was initially admitted with chest pain and was found to have a flutter with RVR.  He is now in normal sinus rhythm.  We will continue with amiodarone and oral metoprolol.  Given patient's chest pain as well as mildly elevated troponins and multiple risk factors for CAD patient to have coronary cath today.  We will follow-up coronary cath results.  Patient would benefit from the addition of a statin.  Given that the patient is on HIV medications which may interfere with statins we will discuss with pharmacy as to what statin to use (possibly pravastatin).  No further work-up at this time.  Cardiology follow-up and recommendations appreciated.

## 2018-06-18 NOTE — Progress Notes (Signed)
Pt waiting for Cath procedure.  No complaints of CP or any discomfort at this time.  Pt on beside monitor with call bell in reach.

## 2018-06-18 NOTE — Progress Notes (Signed)
Pt received from cath lab. VSS. R groin level 0. Dinner tray ordered. Will continue to monitor.  Clyde Canterbury, RN

## 2018-06-18 NOTE — Progress Notes (Signed)
Inpatient Diabetes Program Recommendations  AACE/ADA: New Consensus Statement on Inpatient Glycemic Control (2019)  Target Ranges:  Prepandial:   less than 140 mg/dL      Peak postprandial:   less than 180 mg/dL (1-2 hours)      Critically ill patients:  140 - 180 mg/dL  Results for JANDEL, PATRIARCA (MRN [id number]) as of 06/18/2018 08:16  Ref. Range 06/17/2018 11:20 06/17/2018 16:45 06/17/2018 21:46 06/18/2018 06:24  Glucose-Capillary Latest Ref Range: 65 - 99 mg/dL 219 (H)  Novolog 5 units 197 (H)  Novolog 3 units 272 (H) 215 (H)   Results for CHAMAR, BROUGHTON (MRN [id number]) as of 06/17/2018 09:39  Ref. Range 06/16/2018 06:06 06/16/2018 11:21 06/16/2018 17:49 06/16/2018 21:09 06/17/2018 06:40  Glucose-Capillary Latest Ref Range: 65 - 99 mg/dL 232 (H)  Novolog 5 units 200 (H)  Novolog 3 units 221 (H)  Novolog 5 units 318 (H)  Lantus 10 units 271 (H)  Novolog 8 units    Review of Glycemic Control  Diabetes history: DM2 Outpatient Diabetes medications: Lantus 20 units QHS Current orders for Inpatient glycemic control:  Novolog 0-15 units TID with meals  Inpatient Diabetes Program Recommendations: Insulin - Basal:Noted Lantus was discontinued and patient last received Lantus 10 units on 06/16/18.  Please consider reordering Lantus but increase to 13 units daily. Correction (SSI): Please consider ordering Novolog 0-5 units QHS for bedtime correction scale.  Thanks, Barnie Alderman, RN, MSN, CDE Diabetes Coordinator Inpatient Diabetes Program [phone] (Team Pager from 8am to 5pm)

## 2018-06-18 NOTE — H&P (View-Only) (Signed)
Progress Note  Patient Name: Thomas [name] Date of Encounter: 06/18/2018  Primary Cardiologist: Fransico Him, MD  Subjective   Feeling much better today. No CP or SOB. Resting comfortably in HD. Just hungry.  Inpatient Medications    Scheduled Meds: . amiodarone  400 mg Oral BID  . bictegravir-emtricitabine-tenofovir AF  1 tablet Oral Daily  . Chlorhexidine Gluconate Cloth  6 each Topical Q0600  . cinacalcet  90 mg Oral Q breakfast  . darbepoetin (ARANESP) injection - DIALYSIS  40 mcg Intravenous Q Tue-HD  . doxercalciferol  6 mcg Intravenous Q T,Th,Sa-HD  . feeding supplement (ENSURE ENLIVE)  237 mL Oral BID BM  . ferric citrate  840 mg Oral TID WC  . gabapentin  100 mg Oral Q T,Th,Sa-HD  . insulin aspart  0-15 Units Subcutaneous TID WC  . metoprolol succinate  100 mg Oral Daily  . pantoprazole  80 mg Oral Daily   Continuous Infusions:  PRN Meds: acetaminophen, camphor-menthol   Vital Signs    Vitals:   06/18/18 0624 06/18/18 0720 06/18/18 0727 06/18/18 0730  BP: (!) 135/54 (!) 164/100 (!) 160/110 (!) 179/95  Pulse: 80 82 81 80  Resp: (!) 26 (!) 22    Temp: 97.9 F (36.6 C) 98.6 F (37 C)    TempSrc: Oral Oral    SpO2: 96% 96%    Weight:  285 lb 4.4 oz (129.4 kg)    Height:        Intake/Output Summary (Last 24 hours) at 06/18/2018 0828 Last data filed at 06/17/2018 1200 Gross per 24 hour  Intake 200 ml  Output -  Net 200 ml   Filed Weights   06/16/18 1708 06/17/18 0423 06/18/18 0720  Weight: 272 lb 0.8 oz (123.4 kg) 282 lb 12.8 oz (128.3 kg) 285 lb 4.4 oz (129.4 kg)   Telemetry    NSR frequent PACs, 6 beat NSVT- Personally Reviewed  Physical Exam   GEN: No acute distress.  HEENT: Normocephalic, atraumatic, sclera non-icteric. Neck: No JVD or bruits. Cardiac: RRR occ ectopy no murmurs, rubs, or gallops.  Radials/DP/PT 1+ and equal bilaterally.  Respiratory: Clear to auscultation bilaterally. Breathing is unlabored. GI: Soft, nontender,  non-distended, BS +x 4. MS: bilat BKA. Extremities: No clubbing or cyanosis. No UE edema. S/p bilateral BKA.  Neuro:  AAOx3. Follows commands. Psych:  Responds to questions appropriately with a normal affect.  Labs    Chemistry Recent Labs  Lab 06/16/18 0247 06/17/18 0249 06/18/18 0338  NA 137 137 138  K 4.2 4.0 4.1  CL 99* 98* 98*  CO2 17* 25 22  GLUCOSE 250* 340* 206*  BUN 78* 45* 70*  CREATININE 12.91* 9.04* 11.45*  CALCIUM 8.3* 8.6* 8.8*  ALBUMIN 3.5  --   --   GFRNONAA 4* 6* 4*  GFRAA 4* 7* 5*  ANIONGAP 21* 14 18*     Hematology Recent Labs  Lab 06/16/18 0247 06/17/18 0249 06/18/18 0338  WBC 9.1 8.7 8.6  RBC 2.72* 2.71* 2.64*  HGB 9.7* 9.4* 9.0*  HCT 27.8* 28.2* 27.4*  MCV 102.2* 104.1* 103.8*  MCH 35.7* 34.7* 34.1*  MCHC 34.9 33.3 32.8  RDW 15.8* 16.0* 15.6*  PLT 235 207 181    Cardiac Enzymes Recent Labs  Lab 06/15/18 2042 06/16/18 0247 06/16/18 1053  TROPONINI 1.27* 1.38* 0.99*    Recent Labs  Lab 06/15/18 1240  TROPIPOC 0.74*     Radiology    No results found.  Patient Profile  52 y.o. male with history ofHIV, DM, ESRD on HD, HTN, arhythmia (questionable prior atrial fib or flutter or SVT in [place], with atrial flutter in our system), L BKA, secondary hyperparathyroidism, anemia, chronic combined CHF. He established care with Dr. [name] 02/2017. Prior cardiac care was in [place]. He had some type of SVT and had what sounds like from his description as afib or flutter and had a TEE/DCCV and about 2 weeks later had reoccurrence of the arrhythmia and had an ablation. He had been on chronic anticoagulation with warfarin. He was seen in the office 11/2017 to re-establish care with cardiology and get someone to follow his coumadin but ended up being admitted for leg ulcer infection. He was again seen in the hospital 04/2017 for chest pain and acute heart failure. Troponin was elevated in flat trend up to peak 1.07 felt related to CHF/CKD. 2D echo at that  time showed mod LVH, EF 35-40%, akinesis of the inferior myocardium, grade 2 DD, mild AI/MR, severely dilated LA, mildly dilated RA. Records from [place] were requested but what we received from 2013 and 2016 were unhelpful and vague. Has had multiple recent admissions in our system - 04/2018 for reported rapid atrial fib, 05/2018 for chest pain/rapid atrial fib, and now recurrent chest pain, SOB, elevated troponin and atrial flutter with RVR -> Dr. Sallyanne Kuster reviewed EKGs and felt none of them showed atrial fib, only atrial flutter.  Assessment & Plan    1. Persistent recurrent atrial flutter - recent TSH wnl. Now loading with amiodarone. Maintaining NSR. Coumadin on hold in prep for cath. Will clarify plan for amiodarone dosing with MD (currently written for 400mg  BID through 6/24 after 7 days).   2. Recurrent chest pain/elevated troponin - INR 1.6. Given comorbidities, cardiomyopathy and degree of troponin elevation, cath is indicated. Risks and benefits reviewed with patient yesterday, will proceed today after HD. If there is any delay in his cath for any reason until tomorrow, would need to consider heparin in the interim. Orders written. May also benefit from statin therapy; would appreciate IM input on appropriate statin choices given his HIV regimen.  Give ASA 81mg  prior to cath - per Dr. Sallyanne Kuster, "If he does not require revascularization, would stop the aspirin due to the increased bleeding risk on oral anticoagulants.  If percutaneous revascularization and stenting is performed, will require 'triple therapy' for 30 days, then warfarin and Plavix only thereafter." Have only written for pre-cath dose.  3. ESRD on HD - per renal.  4. HIV - per IM.  5. Chronic combined CHF - volume managed with HD. Continue Toprol. Not sure ACEI/ARB/spiro would be useful in setting of his ESRD. There are some low blood pressures documented as well, so would hold off on titration of regimen for now.  6. Anemia  (macrocytic) - per primary team.  For questions or updates, please contact [place] Please consult [email or web address] for contact info under Cardiology/STEMI.  Signed, Charlie Pitter, PA-C 06/18/2018, 8:28 AM    I have seen and examined the patient along with Charlie Pitter, PA-C.  I have reviewed the chart, notes and new data.  I agree with PA's note.  Key new complaints: no angina or dyspnea Key examination changes: no recurrent sustained atrial arrhythmia Key new findings / data: INR 1.6  PLAN: For cardiac cath this afternoon (INR should be well beneath 1.5 by then).  Sanda Klein, MD, [place] [phone] 06/18/2018, 10:07 AM

## 2018-06-18 NOTE — Progress Notes (Addendum)
Progress Note  Patient Name: Thomas [name] Date of Encounter: 06/18/2018  Primary Cardiologist: Fransico Him, MD  Subjective   Feeling much better today. No CP or SOB. Resting comfortably in HD. Just hungry.  Inpatient Medications    Scheduled Meds: . amiodarone  400 mg Oral BID  . bictegravir-emtricitabine-tenofovir AF  1 tablet Oral Daily  . Chlorhexidine Gluconate Cloth  6 each Topical Q0600  . cinacalcet  90 mg Oral Q breakfast  . darbepoetin (ARANESP) injection - DIALYSIS  40 mcg Intravenous Q Tue-HD  . doxercalciferol  6 mcg Intravenous Q T,Th,Sa-HD  . feeding supplement (ENSURE ENLIVE)  237 mL Oral BID BM  . ferric citrate  840 mg Oral TID WC  . gabapentin  100 mg Oral Q T,Th,Sa-HD  . insulin aspart  0-15 Units Subcutaneous TID WC  . metoprolol succinate  100 mg Oral Daily  . pantoprazole  80 mg Oral Daily   Continuous Infusions:  PRN Meds: acetaminophen, camphor-menthol   Vital Signs    Vitals:   06/18/18 0624 06/18/18 0720 06/18/18 0727 06/18/18 0730  BP: (!) 135/54 (!) 164/100 (!) 160/110 (!) 179/95  Pulse: 80 82 81 80  Resp: (!) 26 (!) 22    Temp: 97.9 F (36.6 C) 98.6 F (37 C)    TempSrc: Oral Oral    SpO2: 96% 96%    Weight:  285 lb 4.4 oz (129.4 kg)    Height:        Intake/Output Summary (Last 24 hours) at 06/18/2018 0828 Last data filed at 06/17/2018 1200 Gross per 24 hour  Intake 200 ml  Output -  Net 200 ml   Filed Weights   06/16/18 1708 06/17/18 0423 06/18/18 0720  Weight: 272 lb 0.8 oz (123.4 kg) 282 lb 12.8 oz (128.3 kg) 285 lb 4.4 oz (129.4 kg)   Telemetry    NSR frequent PACs, 6 beat NSVT- Personally Reviewed  Physical Exam   GEN: No acute distress.  HEENT: Normocephalic, atraumatic, sclera non-icteric. Neck: No JVD or bruits. Cardiac: RRR occ ectopy no murmurs, rubs, or gallops.  Radials/DP/PT 1+ and equal bilaterally.  Respiratory: Clear to auscultation bilaterally. Breathing is unlabored. GI: Soft, nontender,  non-distended, BS +x 4. MS: bilat BKA. Extremities: No clubbing or cyanosis. No UE edema. S/p bilateral BKA.  Neuro:  AAOx3. Follows commands. Psych:  Responds to questions appropriately with a normal affect.  Labs    Chemistry Recent Labs  Lab 06/16/18 0247 06/17/18 0249 06/18/18 0338  NA 137 137 138  K 4.2 4.0 4.1  CL 99* 98* 98*  CO2 17* 25 22  GLUCOSE 250* 340* 206*  BUN 78* 45* 70*  CREATININE 12.91* 9.04* 11.45*  CALCIUM 8.3* 8.6* 8.8*  ALBUMIN 3.5  --   --   GFRNONAA 4* 6* 4*  GFRAA 4* 7* 5*  ANIONGAP 21* 14 18*     Hematology Recent Labs  Lab 06/16/18 0247 06/17/18 0249 06/18/18 0338  WBC 9.1 8.7 8.6  RBC 2.72* 2.71* 2.64*  HGB 9.7* 9.4* 9.0*  HCT 27.8* 28.2* 27.4*  MCV 102.2* 104.1* 103.8*  MCH 35.7* 34.7* 34.1*  MCHC 34.9 33.3 32.8  RDW 15.8* 16.0* 15.6*  PLT 235 207 181    Cardiac Enzymes Recent Labs  Lab 06/15/18 2042 06/16/18 0247 06/16/18 1053  TROPONINI 1.27* 1.38* 0.99*    Recent Labs  Lab 06/15/18 1240  TROPIPOC 0.74*     Radiology    No results found.  Patient Profile  52 y.o. male with history ofHIV, DM, ESRD on HD, HTN, arhythmia (questionable prior atrial fib or flutter or SVT in [place], with atrial flutter in our system), L BKA, secondary hyperparathyroidism, anemia, chronic combined CHF. He established care with Dr. [name] 02/2017. Prior cardiac care was in [place]. He had some type of SVT and had what sounds like from his description as afib or flutter and had a TEE/DCCV and about 2 weeks later had reoccurrence of the arrhythmia and had an ablation. He had been on chronic anticoagulation with warfarin. He was seen in the office 11/2017 to re-establish care with cardiology and get someone to follow his coumadin but ended up being admitted for leg ulcer infection. He was again seen in the hospital 04/2017 for chest pain and acute heart failure. Troponin was elevated in flat trend up to peak 1.07 felt related to CHF/CKD. 2D echo at that  time showed mod LVH, EF 35-40%, akinesis of the inferior myocardium, grade 2 DD, mild AI/MR, severely dilated LA, mildly dilated RA. Records from [place] were requested but what we received from 2013 and 2016 were unhelpful and vague. Has had multiple recent admissions in our system - 04/2018 for reported rapid atrial fib, 05/2018 for chest pain/rapid atrial fib, and now recurrent chest pain, SOB, elevated troponin and atrial flutter with RVR -> Dr. Sallyanne Kuster reviewed EKGs and felt none of them showed atrial fib, only atrial flutter.  Assessment & Plan    1. Persistent recurrent atrial flutter - recent TSH wnl. Now loading with amiodarone. Maintaining NSR. Coumadin on hold in prep for cath. Will clarify plan for amiodarone dosing with MD (currently written for 400mg  BID through 6/24 after 7 days).   2. Recurrent chest pain/elevated troponin - INR 1.6. Given comorbidities, cardiomyopathy and degree of troponin elevation, cath is indicated. Risks and benefits reviewed with patient yesterday, will proceed today after HD. If there is any delay in his cath for any reason until tomorrow, would need to consider heparin in the interim. Orders written. May also benefit from statin therapy; would appreciate IM input on appropriate statin choices given his HIV regimen.  Give ASA 81mg  prior to cath - per Dr. Sallyanne Kuster, "If he does not require revascularization, would stop the aspirin due to the increased bleeding risk on oral anticoagulants.  If percutaneous revascularization and stenting is performed, will require 'triple therapy' for 30 days, then warfarin and Plavix only thereafter." Have only written for pre-cath dose.  3. ESRD on HD - per renal.  4. HIV - per IM.  5. Chronic combined CHF - volume managed with HD. Continue Toprol. Not sure ACEI/ARB/spiro would be useful in setting of his ESRD. There are some low blood pressures documented as well, so would hold off on titration of regimen for now.  6. Anemia  (macrocytic) - per primary team.  For questions or updates, please contact [place] Please consult [email or web address] for contact info under Cardiology/STEMI.  Signed, Charlie Pitter, PA-C 06/18/2018, 8:28 AM    I have seen and examined the patient along with Charlie Pitter, PA-C.  I have reviewed the chart, notes and new data.  I agree with PA's note.  Key new complaints: no angina or dyspnea Key examination changes: no recurrent sustained atrial arrhythmia Key new findings / data: INR 1.6  PLAN: For cardiac cath this afternoon (INR should be well beneath 1.5 by then).  Sanda Klein, MD, [place] [phone] 06/18/2018, 10:07 AM

## 2018-06-18 NOTE — Progress Notes (Addendum)
[facility] Progress Note   Subjective:  Seen on HD. UF goal 5L, BFR 500. Tolerating well without issues. No CP/dyspnea today. INR is down to 1.6, for LHC after HD today.   Objective Vitals:   06/18/18 0800 06/18/18 0830 06/18/18 0900 06/18/18 0930  BP: 136/64 134/81 110/84 (!) 121/102  Pulse: 80 84 72 79  Resp:      Temp:      TempSrc:      SpO2:      Weight:      Height:       Physical Exam General: Well appearing man, NAD. Heart: RRR; no murmur Lungs: CTAB Abdomen: Obese, non-tender  Extremities: B BKA; no stump edema Dialysis Access: R forearm AVF + thrill  Additional Objective Labs: Basic Metabolic Panel: Recent Labs  Lab 06/16/18 0247 06/17/18 0249 06/18/18 0338  NA 137 137 138  K 4.2 4.0 4.1  CL 99* 98* 98*  CO2 17* 25 22  GLUCOSE 250* 340* 206*  BUN 78* 45* 70*  CREATININE 12.91* 9.04* 11.45*  CALCIUM 8.3* 8.6* 8.8*  PHOS 8.4*  --   --    Liver Function Tests: Recent Labs  Lab 06/16/18 0247  ALBUMIN 3.5   CBC: Recent Labs  Lab 06/15/18 1216 06/16/18 0247 06/17/18 0249 06/18/18 0338  WBC 8.9 9.1 8.7 8.6  HGB 10.3* 9.7* 9.4* 9.0*  HCT 31.3* 27.8* 28.2* 27.4*  MCV 103.6* 102.2* 104.1* 103.8*  PLT 219 235 207 181   Cardiac Enzymes: Recent Labs  Lab 06/15/18 2042 06/16/18 0247 06/16/18 1053  TROPONINI 1.27* 1.38* 0.99*   CBG: Recent Labs  Lab 06/17/18 0640 06/17/18 1120 06/17/18 1645 06/17/18 2146 06/18/18 0624  GLUCAP 271* 219* 197* 272* 215*   Medications: . sodium chloride    . sodium chloride     . amiodarone  400 mg Oral BID  . aspirin  81 mg Oral Pre-Cath  . bictegravir-emtricitabine-tenofovir AF  1 tablet Oral Daily  . Chlorhexidine Gluconate Cloth  6 each Topical Q0600  . cinacalcet  90 mg Oral Q breakfast  . darbepoetin (ARANESP) injection - DIALYSIS  40 mcg Intravenous Q Tue-HD  . doxercalciferol  6 mcg Intravenous Q T,Th,Sa-HD  . feeding supplement (ENSURE ENLIVE)  237 mL Oral BID BM  . ferric  citrate  840 mg Oral TID WC  . gabapentin  100 mg Oral Q T,Th,Sa-HD  . insulin aspart  0-15 Units Subcutaneous TID WC  . metoprolol succinate  100 mg Oral Daily  . pantoprazole  80 mg Oral Daily  . sodium chloride flush  3 mL Intravenous Q12H    Dialysis Orders: TTS at [facility] 4:15hr, 3K/2.25Ca, BFR 500, DFR 800, EDW 120kg, AVF, no heparin (on warfarin) - Hectoral 9mcg IV q HD - No venofer, ESA - Binder: Auryxia 4/meals.  Assessment/Plan: 1. A-flutter w/ 2:1 block: Converted to NSR with diltiazem and amiodarone drips - now on PO amiodarone. ^ troponin; for ischemic evaluation with coronary angiogram 6/20.   Stable agree with angio now that aflutter has settled 2. ESRD:Continuing HD per usual TTS schedule, HD today. Patient was seen on dialysis 3. Hypertension/volume:BP controlled, 5L goal today - still will not reach EDW. Fluids disc, will work on gradually getting back down. 4. Anemia:Hgb 9. Not on ESA as outpatient, started Aranesp 18mcg weekly (given 6/18). 5. Metabolic bone disease:Ca ok, Phos high. Continue Auryxia, Sensipar, Hectoral. 6. Type 2 DM: per primary. 7. HIV: per primary. 8. Prior known A-fib (on warfarin and metoprolol):  S/p recurrent admits for RVR.   Veneta Penton, PA-C 06/18/2018, 10:01 AM  [facility] Pager: [phone]

## 2018-06-18 NOTE — Interval H&P Note (Signed)
History and Physical Interval Note:  06/18/2018 5:28 PM  Thomas [name]  has presented today for surgery, with the diagnosis of cp  The various methods of treatment have been discussed with the patient and family. After consideration of risks, benefits and other options for treatment, the patient has consented to  Procedure(s): LEFT HEART CATH AND CORONARY ANGIOGRAPHY (N/A) as a surgical intervention .  The patient's history has been reviewed, patient examined, no change in status, stable for surgery.  I have reviewed the patient's chart and labs.  Questions were answered to the patient's satisfaction.   Cath Lab Visit (complete for each Cath Lab visit)  Clinical Evaluation Leading to the Procedure:   ACS: Yes.    Non-ACS:    Anginal Classification: CCS III  Anti-ischemic medical therapy: Minimal Therapy (1 class of medications)  Non-Invasive Test Results: No non-invasive testing performed  Prior CABG: No previous CABG        Collier [place] [facility] 06/18/2018 5:28 PM

## 2018-06-19 ENCOUNTER — Encounter (HOSPITAL_COMMUNITY): Payer: Self-pay | Admitting: Cardiology

## 2018-06-19 LAB — CBC
HEMATOCRIT: 29.3 % — AB (ref 39.0–52.0)
HEMOGLOBIN: 9.8 g/dL — AB (ref 13.0–17.0)
MCH: 34.9 pg — AB (ref 26.0–34.0)
MCHC: 33.4 g/dL (ref 30.0–36.0)
MCV: 104.3 fL — ABNORMAL HIGH (ref 78.0–100.0)
Platelets: 203 10*3/uL (ref 150–400)
RBC: 2.81 MIL/uL — ABNORMAL LOW (ref 4.22–5.81)
RDW: 15.6 % — ABNORMAL HIGH (ref 11.5–15.5)
WBC: 7.5 10*3/uL (ref 4.0–10.5)

## 2018-06-19 LAB — BASIC METABOLIC PANEL
Anion gap: 11 (ref 5–15)
BUN: 41 mg/dL — AB (ref 6–20)
CO2: 28 mmol/L (ref 22–32)
CREATININE: 8.14 mg/dL — AB (ref 0.61–1.24)
Calcium: 9.1 mg/dL (ref 8.9–10.3)
Chloride: 96 mmol/L — ABNORMAL LOW (ref 101–111)
GFR calc Af Amer: 8 mL/min — ABNORMAL LOW (ref >60)
GFR calc non Af Amer: 7 mL/min — ABNORMAL LOW (ref >60)
Glucose, Bld: 324 mg/dL — ABNORMAL HIGH (ref 65–99)
Potassium: 3.8 mmol/L (ref 3.5–5.1)
Sodium: 135 mmol/L (ref 135–145)

## 2018-06-19 LAB — PROTIME-INR
INR: 1.15
Prothrombin Time: 14.6 seconds (ref 11.4–15.2)

## 2018-06-19 LAB — GLUCOSE, CAPILLARY
Glucose-Capillary: 317 mg/dL — ABNORMAL HIGH (ref 65–99)
Glucose-Capillary: 241 mg/dL — ABNORMAL HIGH (ref 65–99)

## 2018-06-19 MED ORDER — ROSUVASTATIN CALCIUM 20 MG PO TABS: 20.0000 mg | ORAL_TABLET | Freq: Every day | ORAL | 1 refills | Status: DC

## 2018-06-19 MED ORDER — INSULIN GLARGINE 100 UNIT/ML ~~LOC~~ SOLN
10.0000 [IU] | Freq: Once | SUBCUTANEOUS | Status: AC
  Administered 2018-06-19: 10 [IU] via SUBCUTANEOUS
  Filled 2018-06-19: qty 0.1

## 2018-06-19 MED ORDER — ROSUVASTATIN CALCIUM 10 MG PO TABS: 20.0000 mg | ORAL_TABLET | Freq: Every day | ORAL | Status: DC

## 2018-06-19 MED ORDER — AMIODARONE HCL 200 MG PO TABS: 400.0000 mg | ORAL_TABLET | Freq: Every day | ORAL | Status: DC

## 2018-06-19 MED ORDER — WARFARIN SODIUM 7.5 MG PO TABS: 7.5000 mg | ORAL_TABLET | Freq: Once | ORAL | Status: DC

## 2018-06-19 MED ORDER — CHLORHEXIDINE GLUCONATE CLOTH 2 % EX PADS: 6.0000 | MEDICATED_PAD | Freq: Every day | CUTANEOUS | Status: DC

## 2018-06-19 MED ORDER — AMIODARONE HCL 200 MG PO TABS: 400.0000 mg | ORAL_TABLET | Freq: Every day | ORAL | 0 refills | Status: DC

## 2018-06-19 MED ORDER — WARFARIN - PHARMACIST DOSING INPATIENT: Freq: Every day | Status: DC

## 2018-06-19 NOTE — Progress Notes (Signed)
Internal Medicine Attending:   I saw and examined the patient. I reviewed the resident's note and I agree with the resident's findings and plan as documented in the resident's note.  Patient feels well today with no new complaints.  Patient was initially admitted with chest pain secondary to a flutter with RVR.  Patient is now in normal sinus rhythm.  We will continue with p.o. metoprolol and amiodarone per cardiology.  Patient underwent a coronary artery catheterization yesterday which showed severe coronary tree calcification and and severe two-vessel disease. Cardiology follow-up and recommendations appreciated.  Patient would not likely benefit from stenting in the absence of angina.  We will continue with medical management of his coronary disease.  Will start the patient on a high intensity statin.  Continue with hemodialysis per nephrology.  Will need to restart his Coumadin for his A. fib today.  I did not think that he needs to be bridged for his A. fib.  Will consider discharge home today if no further cardiac work-up at this time.

## 2018-06-19 NOTE — Progress Notes (Addendum)
[place] for Warfarin Indication: atrial fibrillation  Allergies  Allergen Reactions  . Oxycodone-Acetaminophen Other (See Comments)    Hallucinations/delirum    Patient Measurements: Height: 6\' 6"  (198.1 cm) Weight: 272 lb 7.8 oz (123.6 kg) IBW/kg (Calculated) : 91.4 Heparin Dosing Weight:   Vital Signs: Temp: 98.7 F (37.1 C) (06/21 0731) Temp Source: Oral (06/21 0731) BP: 152/64 (06/21 0731) Pulse Rate: 97 (06/21 0731)  Labs: Recent Labs    06/16/18 1053  06/17/18 0249 06/18/18 0338 06/18/18 1220 06/19/18 0620  HGB  --    < > 9.4* 9.0*  --  9.8*  HCT  --   --  28.2* 27.4*  --  29.3*  PLT  --   --  207 181  --  203  LABPROT  --    < > 24.0* 18.9* 16.3* 14.6  INR  --    < > 2.17 1.60 1.33 1.15  CREATININE  --   --  9.04* 11.45*  --  8.14*  TROPONINI 0.99*  --   --   --   --   --    < > = values in this interval not displayed.    Estimated Creatinine Clearance: 15.8 mL/min (A) (by C-G formula based on SCr of 8.14 mg/dL (H)).   Assessment: Patient came to ED with chest pain and Afib with RVR. Pharmacy consulted to dose PTA warfarin.   Resuming warfarin s/p cath  CHA2DS2-VASc = 3 (HTN, DM, CHF) PTA warfarin: 7.5 mg daily except for Sundy takes 3.75 mg.   New amiodarone start this admission may increase INR  Goal of Therapy:  INR 2-3 Monitor platelets by anticoagulation protocol: Yes   Plan:  Warfarin 7.5 mg po x 1 tonight Daily INR and CBC Monitor for s/sx of bleeding   Thank you Anette Guarneri, PharmD [phone]

## 2018-06-19 NOTE — Progress Notes (Signed)
Inpatient Diabetes Program Recommendations  AACE/ADA: New Consensus Statement on Inpatient Glycemic Control (2019)  Target Ranges:  Prepandial:   less than 140 mg/dL      Peak postprandial:   less than 180 mg/dL (1-2 hours)      Critically ill patients:  140 - 180 mg/dL   Results for TIRSO, LAWS (MRN [id number]) as of 06/19/2018 08:10  Ref. Range 06/18/2018 06:24 06/18/2018 12:29 06/18/2018 16:15 06/18/2018 18:26 06/18/2018 20:47 06/19/2018 05:56  Glucose-Capillary Latest Ref Range: 65 - 99 mg/dL 215 (H) 199 (H) 186 (H) 170 (H) 232 (H) 317 (H)    Review of Glycemic Control  Diabetes history: DM2 Outpatient Diabetes medications: Lantus 20 units QHS Current orders for Inpatient glycemic control: Lantus 10 x 1 now, Novolog 0-15 units TID with meals  Inpatient Diabetes Program Recommendations: Insulin - Basal: NO Lantus given on 06/18/18. As a result glucose up to 317 mg/dl this morning.  Noted Lantus 10 units x 1 ordered for now. Please consider ordering daily dose of Lantus but increase to 13 units daily. Correction (SSI): Please consider ordering Novolog 0-5 units QHS for bedtime correction scale.  Thanks, Barnie Alderman, RN, MSN, CDE Diabetes Coordinator Inpatient Diabetes Program [phone] (Team Pager from 8am to 5pm)

## 2018-06-19 NOTE — Progress Notes (Signed)
Coumadin clinic f/u arranged 6/26. It does appear someone had already arranged a f/u appt with Cecilie Kicks NP on 7/3 for post-hospital visit, will keep this appt Melina Copa PA-C

## 2018-06-19 NOTE — Progress Notes (Signed)
[facility] Progress Note   End stage renal disease T[place] schedule [place]  , Type 2 DM, HTN, PVD (s/p B BKAs), HIV, Hx dysrhythmias (SVT, A-fib RVR, A-flutter) in past who was admitted with recurrent atrial flutter     Subjective:  Patient stable today   Underwent cardiac catheterization and coronary angioplasty 6/20  He denies any pain or shortness of breath this morning     Objective Vitals:   06/18/18 2048 06/19/18 0007 06/19/18 0557 06/19/18 0731  BP: 94/66 97/66 (!) 152/74 (!) 152/64  Pulse: 84 92 88 97  Resp: 19 13 18 16   Temp: 98.1 F (36.7 C) 98.7 F (37.1 C) 98 F (36.7 C) 98.7 F (37.1 C)  TempSrc: Oral Oral Oral Oral  SpO2: 100% 99% 100% 97%  Weight:      Height:       Physical Exam General: Well appearing man, NAD. Heart: RRR; no murmur Lungs: CTAB Abdomen: Obese, non-tender  Extremities: B BKA; no stump edema Dialysis Access: R forearm AVF + thrill  Additional Objective Labs: Basic Metabolic Panel: Recent Labs  Lab 06/16/18 0247 06/17/18 0249 06/18/18 0338 06/19/18 0620  NA 137 137 138 135  K 4.2 4.0 4.1 3.8  CL 99* 98* 98* 96*  CO2 17* 25 22 28   GLUCOSE 250* 340* 206* 324*  BUN 78* 45* 70* 41*  CREATININE 12.91* 9.04* 11.45* 8.14*  CALCIUM 8.3* 8.6* 8.8* 9.1  PHOS 8.4*  --   --   --    Liver Function Tests: Recent Labs  Lab 06/16/18 0247  ALBUMIN 3.5   CBC: Recent Labs  Lab 06/15/18 1216 06/16/18 0247 06/17/18 0249 06/18/18 0338 06/19/18 0620  WBC 8.9 9.1 8.7 8.6 7.5  HGB 10.3* 9.7* 9.4* 9.0* 9.8*  HCT 31.3* 27.8* 28.2* 27.4* 29.3*  MCV 103.6* 102.2* 104.1* 103.8* 104.3*  PLT 219 235 207 181 203   Cardiac Enzymes: Recent Labs  Lab 06/15/18 2042 06/16/18 0247 06/16/18 1053  TROPONINI 1.27* 1.38* 0.99*   CBG: Recent Labs  Lab 06/18/18 1229 06/18/18 1615 06/18/18 1826 06/18/18 2047 06/19/18 0556  GLUCAP 199* 186* 170* 232* 317*   Medications: . sodium chloride     . amiodarone  400 mg Oral BID   . bictegravir-emtricitabine-tenofovir AF  1 tablet Oral Daily  . Chlorhexidine Gluconate Cloth  6 each Topical Q0600  . cinacalcet  90 mg Oral Q breakfast  . darbepoetin (ARANESP) injection - DIALYSIS  40 mcg Intravenous Q Tue-HD  . doxercalciferol  6 mcg Intravenous Q T,Th,Sa-HD  . feeding supplement (ENSURE ENLIVE)  237 mL Oral BID BM  . ferric citrate  840 mg Oral TID WC  . gabapentin  100 mg Oral Q T,Th,Sa-HD  . insulin aspart  0-15 Units Subcutaneous TID WC  . metoprolol succinate  100 mg Oral Daily  . pantoprazole  80 mg Oral Daily  . rosuvastatin  20 mg Oral q1800  . sodium chloride flush  3 mL Intravenous Q12H  . warfarin  7.5 mg Oral ONCE-1800  . Warfarin - Pharmacist Dosing Inpatient   Does not apply q1800    Dialysis Orders: TTS at [facility] 4:15hr, 3K/2.25Ca, BFR 500, DFR 800, EDW 120kg, AVF, no heparin (on warfarin) - Hectoral 26mcg IV q HD - No venofer, ESA - Binder: Auryxia 4/meals.  Assessment/Plan: 1. A-flutter w/ 2:1 block: Converted to NSR with diltiazem and amiodarone drips - now on PO amiodarone. ^ troponin; for ischemic evaluation with coronary angiogram 6/20 - no intervention  EF 40% multiple vessel disease  2. ESRD:Continuing HD per usual TTS schedule,   3. Hypertension/volume:BP controlled,   UF 4.4 6/20 appears to be comfortable with no signs of volume overload  4. Anemia:Hgb 9. Not on ESA as outpatient, started Aranesp 47mcg weekly (given 6/18). 5. Metabolic bone disease:Ca ok, Phos high. Continue Auryxia, Sensipar, Hectoral. 6. Type 2 DM: per primary. 7. HIV: per primary. 8. Prior known A-fib (on warfarin and metoprolol): S/p recurrent admits for RVR. INR 1.15   Will plan to continue dialysis 6/22  He is now back on warfarin following cardiac catheterization   Edrick Oh MD [place]  06/19/2018, 10:33 AM  [facility] Pager: [phone]

## 2018-06-19 NOTE — Discharge Summary (Signed)
Name: Thomas [name] MRN: [id number] DOB: 1966-12-04 52 y.o. PCP: [name], MD  Date of Admission: 06/15/2018 12:10 PM Date of Discharge: 06/19/2018 Attending Physician: Aldine Contes, MD  Discharge Diagnosis: 1. Atrial flutter with RVR  2. Chronic HFrEF 3. ESRD 4. T2DM   Discharge Medications: Allergies as of 06/19/2018      Reactions   Oxycodone-acetaminophen Other (See Comments)   Hallucinations/delirum      Medication List    TAKE these medications   amiodarone 200 MG tablet Commonly known as:  PACERONE Take 2 tablets (400 mg total) by mouth daily. Notes to patient:  Last dose 06/19/2018 @ 9:46 a.m.   bictegravir-emtricitabine-tenofovir AF 50-200-25 MG Tabs tablet Commonly known as:  BIKTARVY Take 1 tablet by mouth daily.   camphor-menthol lotion Commonly known as:  SARNA Apply topically as needed for itching. Notes to patient:  No recent doses, take as ordered.   cinacalcet 90 MG tablet Commonly known as:  SENSIPAR Take 90 mg by mouth daily.   ferric gluconate 125 mg in sodium chloride 0.9 % 100 mL Inject 125 mg into the vein Every Tuesday,Thursday,and Saturday with dialysis.   gabapentin 100 MG capsule Commonly known as:  NEURONTIN Take 1 capsule (100 mg total) by mouth every dialysis. Notes to patient:  Last dose 06/18/2018   insulin glargine 100 unit/mL Sopn Commonly known as:  LANTUS Inject 0.3 mLs (30 Units total) into the skin at bedtime. What changed:  how much to take Notes to patient:  06/20/2018   metoprolol succinate 100 MG 24 hr tablet Commonly known as:  TOPROL XL Take 1 tablet (100 mg total) by mouth daily. Take with or immediately following a meal.   pantoprazole 40 MG tablet Commonly known as:  PROTONIX Take 2 tablets (80 mg total) by mouth daily.   polycarbophil 625 MG tablet Commonly known as:  FIBERCON Take 1 tablet (625 mg total) by mouth daily. Notes to patient:  No recent doses, take as ordered.   rosuvastatin 20  MG tablet Commonly known as:  CRESTOR Take 1 tablet (20 mg total) by mouth daily at 6 PM.   sevelamer carbonate 800 MG tablet Commonly known as:  RENVELA Take 800 mg by mouth 3 (three) times daily with meals.   warfarin 7.5 MG tablet Commonly known as:  COUMADIN Take as directed. If you are unsure how to take this medication, talk to your nurse or doctor. Original instructions:  Take 1 tablet daily except 1/2 tablet on Sundays or as directed by Anticoagulation Clinic What changed:    how much to take  how to take this  when to take this  additional instructions Notes to patient:  Last dose 06/15/2018       Disposition and follow-up:   Mr.Adelaido Gavitt was discharged from [facility] in Stable condition.  At the hospital follow up visit please address:  1.  Please assess for ongoing chest pain and palpitations. Please ensure compliance with home metoprolol, amiodarone, and rosuvastatin. Please ensure patient has follow up with cardiology and Coumadin clinic.   2.  Labs / imaging needed at time of follow-up: INR   3.  Pending labs/ test needing follow-up: None    Follow-up Appointments: Patient will be called 6/24 to schedule follow up appointment at [facility] and Coumadin clinic.  Follow-up Information    [place]. Call.   Why:  You will get a call on Monday to schedule a hospital follow up appointment  within the next week. Please call us to schedule an appointment if our office does not call you on Monday Contact information: [address] [place] [phone]       [place] Office Follow up.   Specialty:  Cardiology Why:  [facility] - Coumadin clinic - 06/24/18 at 2:45pm for INR check. Contact information: [address], [address] [place]       Isaiah Serge, NP Follow up.   Specialties:  Cardiology, Radiology Why:  [facility]  - 07/01/18 at 11am. Arrive 15 minutes prior to check in. Mickel Baas is one of the nurse practitioners that works with our cardiology team. Contact information: [address] [place] [address] [phone]           Hospital Course by problem list:  1. Atrial flutter with RVR: Patient presented with pressure-like substernal chest pain and was found to be in atrial flutter with RVR. He reported compliance with home metoprolol, but suspect component of non-adherence as he was seen at [facility] clinic 3 days prior to admission with complaint of hallucinations that he believed were caused by the metoprolol. He was loaded with IV amiodarone and transitioned to PO amiodarone 200 mg BID which he will continue to take until his follow up cardiology. He also underwent an ischemic evaluation with LHC during this admission given typical chest pain in a high risk patient. LHC showed severe 2 vessel obstructive CAD and severely calcified coronary tress (see report below). Cardiology recommended medical management as patient is not experiencing refractory angina. He was started on rosuvastatin 20 mg (atorvastatin interacts with HIV medications). Will need follow up at INR clinic as warfarin was held during this admission for [facility]. INR 1.2 on day of discharge.   2. Chronic HFrEF: Patient remained euvolemic on exam during this admission. Medication changes as described above.   3. ESRD: Patient received hemodialysis at his regular schedule during this admission.   4. T2DM: Patient was managed with home Lantus and sliding scale. Home regimen was resumed on day of discharge.   Discharge Vitals:   BP (!) 178/84 (BP Location: Left Arm)   Pulse 90   Temp 98.3 F (36.8 C) (Oral)   Resp 16   Ht 6\' 6"  (1.981 m)   Wt 272 lb 7.8 oz (123.6 kg)   SpO2 100%   BMI 31.49 kg/m   Pertinent Labs, Studies, and Procedures:   CBC Latest Ref Rng & Units 06/19/2018 06/18/2018 06/17/2018  WBC 4.0 - 10.5 K/uL 7.5 8.6 8.7    Hemoglobin 13.0 - 17.0 g/dL 9.8(L) 9.0(L) 9.4(L)  Hematocrit 39.0 - 52.0 % 29.3(L) 27.4(L) 28.2(L)  Platelets 150 - 400 K/uL 203 181 207   BMP Latest Ref Rng & Units 06/19/2018 06/18/2018 06/17/2018  Glucose 65 - 99 mg/dL 324(H) 206(H) 340(H)  BUN 6 - 20 mg/dL 41(H) 70(H) 45(H)  Creatinine 0.61 - 1.24 mg/dL 8.14(H) 11.45(H) 9.04(H)  Sodium 135 - 145 mmol/L 135 138 137  Potassium 3.5 - 5.1 mmol/L 3.8 4.1 4.0  Chloride 101 - 111 mmol/L 96(L) 98(L) 98(L)  CO2 22 - 32 mmol/L 28 22 25   Calcium 8.9 - 10.3 mg/dL 9.1 8.8(L) 8.6(L)    CXR 6/17: FINDINGS: Heart size and pulmonary vascularity are normal. Lungs are clear except for a tiny area of linear scarring in the left midzone. No bone abnormality.  IMPRESSION: No active disease.  LHC:   Prox  LAD lesion is 30% stenosed.  Mid LAD lesion is 80% stenosed.  Ost 3rd Diag lesion is 95% stenosed.  Dist LAD lesion is 90% stenosed.  Ost 1st Diag lesion is 100% stenosed.  Acute Mrg lesion is 95% stenosed.  1st RPLB lesion is 100% stenosed.  Post Atrio lesion is 95% stenosed.  There is mild to moderate left ventricular systolic dysfunction.  LV end diastolic pressure is normal.   1. Severe 2 vessel obstructive CAD. The entire coronary tree is severely calcified.    - 100% large first diagonal vessel. This fills late by left to left collaterals.    - 80% eccentric mid LAD    - 90% distal LAD    - 95% small third diagonal    - 95% RV marginal branch.    - 100% first PLOM    - 95% ongoing PL branch of the RCA 2. Mild to moderate LV dysfunction with global hypokinesis. EF estimated at 40-45% 3. Normal LVEDP   Discharge Instructions: Discharge Instructions    Call MD for:  difficulty breathing, headache or visual disturbances   Complete by:  As directed    Call MD for:  extreme fatigue   Complete by:  As directed    Call MD for:  persistant dizziness or light-headedness   Complete by:  As directed    Diet - low sodium heart  healthy   Complete by:  As directed    Discharge instructions   Complete by:  As directed    Ms. Reller,   You were admitted to the hospital due to a rapid heart rate. You were started on a new medication called amiodarone to keep your heart in a normal rhythm. You will take 2 tablets (400 mg total) every day until you follow up with your heart doctors on July 3rd. Your heart doctor has also arranged follow up for you in the Coumadin clinic on 7/16.   You were also started on a medication for high cholesterol called Crestor or rosuvastatin. You will take 1 tablet (20 mg) every day.   Continue taking your metoprolol as usual.   The internal medicine clinic will give you a call to schedule a hospital follow up appointment. In the meantime, please call us if you have any questions.   - Dr. Frederico Hamman   Increase activity slowly   Complete by:  As directed       Signed: Welford Roche, MD 06/22/2018, 7:55 AM   Pager: [phone]

## 2018-06-19 NOTE — Progress Notes (Signed)
Progress Note  Patient Name: Thomas [name] Date of Encounter: 06/19/2018  Primary Cardiologist: Fransico Him, MD   Subjective   Feels well, no angina, no palpitations, no dyspnea.  Eager to go home Reviewed the results of his coronary angiogram. No sustained arrhythmia on telemetry.  Inpatient Medications    Scheduled Meds: . amiodarone  400 mg Oral BID  . bictegravir-emtricitabine-tenofovir AF  1 tablet Oral Daily  . Chlorhexidine Gluconate Cloth  6 each Topical Q0600  . Chlorhexidine Gluconate Cloth  6 each Topical Q0600  . cinacalcet  90 mg Oral Q breakfast  . darbepoetin (ARANESP) injection - DIALYSIS  40 mcg Intravenous Q Tue-HD  . doxercalciferol  6 mcg Intravenous Q T,Th,Sa-HD  . feeding supplement (ENSURE ENLIVE)  237 mL Oral BID BM  . ferric citrate  840 mg Oral TID WC  . gabapentin  100 mg Oral Q T,Th,Sa-HD  . insulin aspart  0-15 Units Subcutaneous TID WC  . metoprolol succinate  100 mg Oral Daily  . pantoprazole  80 mg Oral Daily  . rosuvastatin  20 mg Oral q1800  . sodium chloride flush  3 mL Intravenous Q12H  . warfarin  7.5 mg Oral ONCE-1800  . Warfarin - Pharmacist Dosing Inpatient   Does not apply q1800   Continuous Infusions: . sodium chloride     PRN Meds: sodium chloride, acetaminophen, camphor-menthol, sodium chloride flush   Vital Signs    Vitals:   06/19/18 0557 06/19/18 0731 06/19/18 1104 06/19/18 1124  BP: (!) 152/74 (!) 152/64 (!) 179/99 (!) 178/84  Pulse: 88 97 90   Resp: 18 16 18 16   Temp: 98 F (36.7 C) 98.7 F (37.1 C) 98.3 F (36.8 C)   TempSrc: Oral Oral Oral   SpO2: 100% 97% 100%   Weight:      Height:        Intake/Output Summary (Last 24 hours) at 06/19/2018 1149 Last data filed at 06/19/2018 4268 Gross per 24 hour  Intake 240 ml  Output -  Net 240 ml   Filed Weights   06/17/18 0423 06/18/18 0720 06/18/18 1143  Weight: 282 lb 12.8 oz (128.3 kg) 285 lb 4.4 oz (129.4 kg) 272 lb 7.8 oz (123.6 kg)    Telemetry      This rhythm with frequent PACs, rare brief bursts of atrial tachycardia, no sustained arrhythmia- Personally Reviewed  ECG     No new tracing- Personally Reviewed  Physical Exam  Obese GEN: No acute distress.   Neck: No JVD Cardiac: RRR, no murmurs, rubs, or gallops.  Respiratory: Clear to auscultation bilaterally. GI: Soft, nontender, non-distended  MS: No edema; No deformity.  Large right upper extremity AV fistula.  Bilateral below the knee amputations.  No evidence of bleeding or hematoma at femoral cath access site Neuro:  Nonfocal  Psych: Normal affect   Labs    Chemistry Recent Labs  Lab 06/16/18 0247 06/17/18 0249 06/18/18 0338 06/19/18 0620  NA 137 137 138 135  K 4.2 4.0 4.1 3.8  CL 99* 98* 98* 96*  CO2 17* 25 22 28   GLUCOSE 250* 340* 206* 324*  BUN 78* 45* 70* 41*  CREATININE 12.91* 9.04* 11.45* 8.14*  CALCIUM 8.3* 8.6* 8.8* 9.1  ALBUMIN 3.5  --   --   --   GFRNONAA 4* 6* 4* 7*  GFRAA 4* 7* 5* 8*  ANIONGAP 21* 14 18* 11     Hematology Recent Labs  Lab 06/17/18 0249 06/18/18 3419 06/19/18 6222  WBC 8.7 8.6 7.5  RBC 2.71* 2.64* 2.81*  HGB 9.4* 9.0* 9.8*  HCT 28.2* 27.4* 29.3*  MCV 104.1* 103.8* 104.3*  MCH 34.7* 34.1* 34.9*  MCHC 33.3 32.8 33.4  RDW 16.0* 15.6* 15.6*  PLT 207 181 203    Cardiac Enzymes Recent Labs  Lab 06/15/18 2042 06/16/18 0247 06/16/18 1053  TROPONINI 1.27* 1.38* 0.99*    Recent Labs  Lab 06/15/18 1240  TROPIPOC 0.74*     BNPNo results for input(s): BNP, PROBNP in the last 168 hours.   DDimer No results for input(s): DDIMER in the last 168 hours.   Radiology    No results found.  Cardiac Studies    Prox LAD lesion is 30% stenosed.  Mid LAD lesion is 80% stenosed.  Ost 3rd Diag lesion is 95% stenosed.  Dist LAD lesion is 90% stenosed.  Ost 1st Diag lesion is 100% stenosed.  Acute Mrg lesion is 95% stenosed.  1st RPLB lesion is 100% stenosed.  Post Atrio lesion is 95% stenosed.  There is mild  to moderate left ventricular systolic dysfunction.  LV end diastolic pressure is normal.   1. Severe 2 vessel obstructive CAD. The entire coronary tree is severely calcified.    - 100% large first diagonal vessel. This fills late by left to left collaterals.    - 80% eccentric mid LAD    - 90% distal LAD    - 95% small third diagonal    - 95% RV marginal branch.    - 100% first PLOM    - 95% ongoing PL branch of the RCA 2. Mild to moderate LV dysfunction with global hypokinesis. EF estimated at 40-45% 3. Normal LVEDP  Plan: I would recommend medical management. Other than the mid LAD and first diagonal the other disease is in smaller distal branches. The diagonal is occluded. The mid LAD could potentially be treated with atherectomy and stenting but I would not advise this unless he has refractory angina on optimal medical therapy and treatment of his arrhythmia. I also don't think this would add much to his overall prognosis. If no bleeding from cath site can resume coumadin tomorrow.    Prox LAD lesion is 30% stenosed.  Mid LAD lesion is 80% stenosed.  Ost 3rd Diag lesion is 95% stenosed.  Dist LAD lesion is 90% stenosed.  Ost 1st Diag lesion is 100% stenosed.  Acute Mrg lesion is 95% stenosed.  1st RPLB lesion is 100% stenosed.  Post Atrio lesion is 95% stenosed.  There is mild to moderate left ventricular systolic dysfunction.  LV end diastolic pressure is normal.   1. Severe 2 vessel obstructive CAD. The entire coronary tree is severely calcified.    - 100% large first diagonal vessel. This fills late by left to left collaterals.    - 80% eccentric mid LAD    - 90% distal LAD    - 95% small third diagonal    - 95% RV marginal branch.    - 100% first PLOM    - 95% ongoing PL branch of the RCA 2. Mild to moderate LV dysfunction with global hypokinesis. EF estimated at 40-45% 3. Normal LVEDP  Plan: I would recommend medical management. Other than the mid LAD and  first diagonal the other disease is in smaller distal branches. The diagonal is occluded. The mid LAD could potentially be treated with atherectomy and stenting but I would not advise this unless he has refractory angina on optimal medical therapy and  treatment of his arrhythmia. I also don't think this would add much to his overall prognosis. If no bleeding from cath site can resume coumadin tomorrow.   Patient Profile     52 y.o. male admitted with angina and acute on chronic systolic and diastolic heart failure decompensation in the setting of atypical atrial flutter, with multiple similar presentations over the last 6 months.  Multiple background illnesses include controlled HIV infection, insulin requiring diabetes mellitus, end-stage renal disease on hemodialysis, PAD with bilateral below-the-knee amputations, previous history of ablation for unknown arrhythmia in [place].  Assessment & Plan    1. AFlutter, atypical: So far with excellent response to amiodarone.  QT is prolonged as expected.  Will reduce the dose of amiodarone to 400 mg daily at discharge today, plan to reduce to 200 mg daily in another 2 weeks.  Restart warfarin.  I agree that he does not require "bridging" with heparin. 2.  Demand ischemia: I do not think he had a true acute atherothrombotic coronary syndrome, but rather had some necrosis of myocardium in the territories downstream of severe chronic stable coronary lesions, triggered by rapid arrhythmia. 3. CAD: He has several severe stenoses, they all involved small vessels, far downstream.  Heavily calcified atherosclerotic lesions are seen throughout the coronary tree, but there is no indication for revascularization at this time.  Needs aggressive risk factor management. 4. ESRD on HD tts 5. CHF: Appears clinically euvolemic.  Decompensation has repeatedly been related to rapid atrial arrhythmia.  Continue high-dose beta-blocker, but may need to reduce the dose as his  amiodarone "kicks in".  Sugar has been rather erratic over the last 24 hours.  Consider adding losartan as an outpatient if his blood pressure is consistently more than 130/80.  Otherwise, I think he is ready for discharge from a cardiology perspective.  Arrange early follow-up.  Reduce amiodarone to 400 mg daily today.  For questions or updates, please contact [place] Please consult [email or web address] for contact info under Cardiology/STEMI.      Signed, Sanda Klein, MD  06/19/2018, 11:49 AM

## 2018-06-19 NOTE — Care Management Important Message (Signed)
Important Message  Patient Details  Name: Thomas [name] MRN: [id number] Date of Birth: 04/22/1966   Medicare Important Message Given:  Yes    Benard Minturn P Dniya Neuhaus 06/19/2018, 12:43 PM

## 2018-06-19 NOTE — Progress Notes (Signed)
   Subjective:  No acute events overnight. Doing well and denies chest pain and shortness of breath.   Objective:  Vital signs in last 24 hours: Vitals:   06/18/18 2048 06/19/18 0007 06/19/18 0557 06/19/18 0731  BP: 94/66 97/66 (!) 152/74 (!) 152/64  Pulse: 84 92 88 97  Resp: 19 13 18 16   Temp: 98.1 F (36.7 C) 98.7 F (37.1 C) 98 F (36.7 C) 98.7 F (37.1 C)  TempSrc: Oral Oral Oral Oral  SpO2: 100% 99% 100% 97%  Weight:      Height:       Physical Exam  Constitutional: He is oriented to person, place, and time.  Morbidly obese male sitting up in bed in no acute distress  Cardiovascular:  Irregularly irregular rhythm, nl S1/S2, no mrg   Pulmonary/Chest:  CTAB, no increased WOB on room air   Abdominal: Soft. Bowel sounds are normal. He exhibits no distension. There is no tenderness.  Musculoskeletal: He exhibits deformity (s/p bilateral BKAs). He exhibits no edema.  Neurological: He is alert and oriented to person, place, and time.    Assessment/Plan:  Principal Problem:   Persistent atrial fibrillation with rapid ventricular response (HCC) Active Problems:   ESRD on dialysis [facility])   Essential hypertension   Chest pain with moderate risk for cardiac etiology   Atrial flutter (HCC)   Non-ST elevation (NSTEMI) myocardial infarction [facility])   # Chest painsecondary to atrial flutter with RVR #History of pAF Patient is doing well and has no complaints this morning. Denies chest pain and shortness of breath. On PO metop and amiodarone per cards recommendations. Stable for discharge today.  - Cardiology following, appreciate recommendations  -ContinuePO metoprolol XL 100 mg QD and amiodarone 400 mg BID. Will verify with cardiology amiodarone dose on discharge.  -Resume warfarin per pharmacy  - On telemetry  # HFrEF with EF 35%: Does not appear hypervolemic on exam.LHC with EF 40% and multiple vessel disease and 2 completely obstructed vessels, nl LVEDP.  - Will  continue to monitor volume status - Starting statin as below   # Hyperlipidemia: Cholesterol 202, TG 596, HDL 20. No LDL calculated due to TG >400. - Start rosuvastatin 20 mg QD   # ESRD on HD:  - Nephrology following, appreciate assistance - Continue home cinacalcet and Renvela - Aranespstarted during this admission   # T2DM: - Lantus 10 units QHS + SSI-M + CBG monitoring  - Continue home gabapentin 100 mg with HD   Dispo: Anticipated discharge today.   Welford Roche, MD 06/19/2018, 7:47 AM Pager: [phone]

## 2018-06-22 ENCOUNTER — Ambulatory Visit: Payer: Medicare Other | Admitting: Physical Therapy

## 2018-06-22 ENCOUNTER — Telehealth: Payer: Self-pay | Admitting: Physical Therapy

## 2018-06-22 ENCOUNTER — Encounter: Payer: Self-pay | Admitting: Physical Therapy

## 2018-06-22 VITALS — HR 91

## 2018-06-22 DIAGNOSIS — R293 Abnormal posture: Secondary | ICD-10-CM | POA: Diagnosis present

## 2018-06-22 DIAGNOSIS — R2689 Other abnormalities of gait and mobility: Secondary | ICD-10-CM | POA: Diagnosis present

## 2018-06-22 DIAGNOSIS — M6281 Muscle weakness (generalized): Secondary | ICD-10-CM | POA: Diagnosis present

## 2018-06-22 DIAGNOSIS — M25662 Stiffness of left knee, not elsewhere classified: Secondary | ICD-10-CM | POA: Diagnosis present

## 2018-06-22 DIAGNOSIS — M25672 Stiffness of left ankle, not elsewhere classified: Secondary | ICD-10-CM | POA: Diagnosis present

## 2018-06-22 DIAGNOSIS — R2681 Unsteadiness on feet: Secondary | ICD-10-CM

## 2018-06-22 DIAGNOSIS — Z9181 History of falling: Secondary | ICD-10-CM | POA: Diagnosis present

## 2018-06-22 NOTE — Telephone Encounter (Signed)
Dr. [name] was hospitalized again from 06/15/2018 to 06/19/2018 with atrial flutter with RVR. He is being seen by outpatient physical therapy for prosthetic training with Bilateral Transtibial Amputations. He has a PT appointment this afternoon. I need to make sure he is medically stable to participate in PT.  Can you please respond to this telephone encounter as to medical clearance to resume PT? Thank you Jamey Reas, PT, DPT PT Specializing in [place] 06/22/2018@ 12:19 PM Phone:  [phone]  Fax:  [phone] [employer] [address] [place], [place] [address]

## 2018-06-23 ENCOUNTER — Encounter: Payer: Self-pay | Admitting: *Deleted

## 2018-06-23 NOTE — Telephone Encounter (Signed)
Shirlean Mylar, sorry for the delay, I was not the attending for his second hospitalization, I will add on Dr Dareen Piano to make sure he is clear to resume PT

## 2018-06-23 NOTE — Therapy (Signed)
[facility] [address] [place], [place], [address] Phone: [phone]   Fax:  [phone]  Physical Therapy Treatment  Patient Details  Name: Thomas [name] MRN: [id number] Date of Birth: 1966/06/29 Referring Provider: Meridee Score, MD   Encounter Date: 06/22/2018  PT End of Session - 06/22/18 2225    Visit Number  6    Number of Visits  26    Date for PT Re-Evaluation  08/14/18    Authorization Type  Medicare & Generic commercial    PT Start Time  1445    PT Stop Time  1530    PT Time Calculation (min)  45 min    Equipment Utilized During Treatment  Gait belt    Activity Tolerance  Patient tolerated treatment well    Behavior During Therapy  [facility] for tasks assessed/performed       Past Medical History:  Diagnosis Date  . Anemia   . Atrial fibrillation ([place])   . Chest pain with moderate risk for cardiac etiology 06/15/2018  . Chronic combined systolic and diastolic CHF (congestive heart failure) ([place])   . Diabetic foot ulcer ([place]) 09/03/2017  . Diabetic wet gangrene of the foot ([place]) 09/04/2017  . ESRD (end stage renal disease) on dialysis [facility])    [place] [address] [place], [place], [place] (06/15/2018)  . Headache    "monthly" (06/15/2018)  . HIV disease ([employer])   . Hyperparathyroidism, secondary ([place])   . Hypertension   . Hypertensive heart disease with end stage renal disease on dialysis ([place]) 03/13/2017  . Pneumonia X 1  . Subacute osteomyelitis, right ankle and foot ([place])   . SVT (supraventricular tachycardia) ([place])    ? afib or atrial flutter s/p TEE/DCCV with subsequent ablation due to reoccurrence in [place]  . Type 2 diabetes mellitus ([place])     Past Surgical History:  Procedure Laterality Date  . AMPUTATION Left    foot  . AMPUTATION Right 12/06/2017   Procedure: AMPUTATION BELOW KNEE;  Surgeon: [name], MD;  Location: [place];  Service: Orthopedics;  Laterality: Right;  . APPLICATION OF WOUND VAC Right 09/04/2017    Procedure: APPLICATION OF WOUND VAC;  Surgeon: [name], DPM;  Location: [place];  Service: Podiatry;  Laterality: Right;  . APPLICATION OF WOUND VAC  12/06/2017   Procedure: APPLICATION OF WOUND VAC;  Surgeon: [name], MD;  Location: [place];  Service: Orthopedics;;  . AV FISTULA PLACEMENT Right 10/19/2012  . AV FISTULA REPAIR Right ~ 02/2018   "had it cleaned out"  . BELOW KNEE LEG AMPUTATION Left ~ 2016  . COLONOSCOPY WITH PROPOFOL N/A 07/30/2017   Procedure: COLONOSCOPY WITH PROPOFOL;  Surgeon: [name], MD;  Location: [facility];  Service: Endoscopy;  Laterality: N/A;  . ESOPHAGOGASTRODUODENOSCOPY (EGD) WITH PROPOFOL N/A 07/28/2017   Procedure: ESOPHAGOGASTRODUODENOSCOPY (EGD) WITH PROPOFOL;  Surgeon: [name], MD;  Location: [facility];  Service: Endoscopy;  Laterality: N/A;  . ESOPHAGOGASTRODUODENOSCOPY (EGD) WITH PROPOFOL N/A 01/15/2018   Procedure: ESOPHAGOGASTRODUODENOSCOPY (EGD) WITH PROPOFOL;  Surgeon: [name], MD;  Location: [facility] ENDOSCOPY;  Service: Endoscopy;  Laterality: N/A;  . FLEXIBLE SIGMOIDOSCOPY N/A 07/27/2017   Procedure: FLEXIBLE SIGMOIDOSCOPY;  Surgeon: [name], MD;  Location: [facility] ENDOSCOPY;  Service: Endoscopy;  Laterality: N/A;  . GRAFT APPLICATION Right 76/22/6333   Procedure: SKIN GRAFT APPLICATION RIGHT FOOT;  Surgeon: [name], DPM;  Location: [place];  Service: Podiatry;  Laterality: Right;  . I&D EXTREMITY  Right 09/04/2017   Procedure: IRRIGATION AND DEBRIDEMENT EXTREMITY;  Surgeon: [name], DPM;  Location: [place];  Service: Podiatry;  Laterality: Right;  . I&D EXTREMITY Right 11/12/2017   Procedure: IRRIGATION AND DEBRIDEMENT ULCER RIGHT FOOT;  Surgeon: [name], DPM;  Location: [place];  Service: Podiatry;  Laterality: Right;  . LEFT HEART CATH AND CORONARY ANGIOGRAPHY N/A 06/18/2018   Procedure: LEFT HEART CATH AND CORONARY ANGIOGRAPHY;  Surgeon: [name], Peter M, MD;  Location: [place] [facility];  Service:  Cardiovascular;  Laterality: N/A;  . WOUND DEBRIDEMENT N/A 09/24/2017   Procedure: DEBRIDEMENT WOUND;  Surgeon: [name], DPM;  Location: [place];  Service: Podiatry;  Laterality: N/A;    Vitals:   06/22/18 1450  Pulse: 91  SpO2: 92%    Subjective Assessment - 06/22/18 1452    Subjective  He was hospitalized 06/19/2018 to 06/22/2018 with atrial fib with RVR. He went to dialysis Saturday. No A-fib.  He has been wearing prosthesis most of awake hours including in hospital.     Pertinent History  R TTA, ESRD, DM2, HIV, cardiomyopathy, CHF, Tachycardia, A-Fib, Mallory-Weiss Tear, obesity    Limitations  Standing;Walking;House hold activities    Patient Stated Goals  To use bilateral prostheses to dance with wife & swimming,     Currently in Pain?  No/denies                       [facility] Adult PT Treatment/Exercise - 06/22/18 1445      Transfers   Transfers  Sit to Stand;Stand to Sit    Sit to Stand  5: Supervision;With upper extremity assist;From chair/3-in-1 chairs without armrests with cane to stabilize    Stand to Sit  5: Supervision;With upper extremity assist;To chair/3-in-1 chairs without armrests with cane to stabilize      Ambulation/Gait   Ambulation/Gait  Yes    Ambulation/Gait Assistance  4: Min guard    Ambulation/Gait Assistance Details  verbal cues on upright posture, sequence & wt shift.    Ambulation Distance (Feet)  125 Feet 125' X 2    Assistive device  Straight cane;Prostheses quad tip    Ambulation Surface  Indoor;Level    Stairs  Yes    Stairs Assistance  5: Supervision    Stairs Assistance Details (indicate cue type and reason)  alternate lead limb so BLE functional strength    Stair Management Technique  Two rails;Step to pattern;Forwards;Alternating pattern alternate ascend increased time & step-to descend alt. lead     Number of Stairs  4 3 reps    Ramp  4: Min assist cane quad tip & bil TTA prostheses    Ramp Details (indicate cue type and  reason)  verbal & tactile cues on posture & wt shift    Curb  3: Mod assist cane quad tip & bil. TTA prostheses    Curb Details (indicate cue type and reason)  manual & verbal cues on sequence & technique    Gait Comments  HR 81-84 bpm with gait and SpO2 91-92% except ramp/curb SpO2 89% with recovery >/= 94% within 1 minute      Prosthetics   Prosthetic Care Comments   PT instructed with demo use of vacuum seal bag with 1# weight for use of prostheses in pool.  Sweat signs & need to pat dry limb/liner to prevent skin issues.  Need to wear prostheses all awake hours even when sick to decrease fall risk  Current prosthetic wear tolerance (days/week)   daily    Current prosthetic wear tolerance (#hours/day)   most awake hours for bil prostheses, removing to dry as needed with rest about half way through day    Residual limb condition   no open areas, normal color, temperature    Education Provided  Skin check;Residual limb care;Proper wear schedule/adjustment;Other (comment) see prosthetic care comments    Person(s) Educated  Patient    Education Method  Explanation;Demonstration;Tactile cues;Verbal cues    Education Method  Verbalized understanding;Returned demonstration;Tactile cues required;Verbal cues required;Needs further instruction               PT Short Term Goals - 06/22/18 2226      PT SHORT TERM GOAL #1   Title  Patient verbalizes proper limb care for sweat management. (All STGs Target Date: 06/17/2018)    Baseline  MET 06/22/2018    Time  1    Period  Months    Status  Achieved      PT SHORT TERM GOAL #2   Title  Patient verbalizes options for pool with & without prostheses to address his goal.     Baseline  MET 06/22/2018    Time  1    Period  Months    Status  Achieved      PT SHORT TERM GOAL #3   Title  Patient performs standing balance with cane support reaching 10" & to floor with supervision.     Baseline  Can reach all the way to the floor and out 12" with  support on cane and supervision    Time  1    Period  Months    Status  Achieved      PT SHORT TERM GOAL #4   Title  Patient ambulates 200' with cane & prostheses with supervision.     Baseline  230' with cane and prostheses with supervision until pt fatigued and required min A to regain balance at end of walk, 89%, 93 bpm after two laps    Time  1    Period  Months    Status  Partially Met      PT SHORT TERM GOAL #5   Title  Patient negotiates stairs with 1 rails(to exit/enter friend's house), curbs & ramps with cane & prostheses with minA.     Baseline  stairs with min A with cane; ramp mod A, curb unable with cane as only support    Time  1    Period  Months    Status  Partially Met      PT SHORT TERM GOAL #6   Title  Pt will improve endurance to be able to complete 6 min walk test with 2 standing rest breaks and more upright trunk posture.    Baseline  Completed 4 minutes, 2-3 standing rest breaks    Time  1    Period  Months    Status  Partially Met        PT Long Term Goals - 05/20/18 1513      PT LONG TERM GOAL #1   Title  Patient verbalizes & demonstrates proper prosthetic care including prostheses with dialysis to enable safe use of bilateral Transtibial prostheses. (All LTGs Target Date: 08/14/2018)    Time  3    Period  Months    Status  New      PT LONG TERM GOAL #2   Title  Patient tolerates wear of bilateral Transtibial  prostheses >90% of awake hours without skin issues or limb pain to enable function during most of his day.     Time  3    Period  Months    Status  New      PT LONG TERM GOAL #3   Title  Standing balance with cane or less support reaching 10" anteriorly, to floor, scanning & managing clothes modified independent.     Time  3    Period  Months    Status  New      PT LONG TERM GOAL #4   Title  Patient ambulates 500' outdoors including grass with cane or less & bilateral prostheses modified independent for community mobility.     Time  3     Period  Months    Status  New      PT LONG TERM GOAL #5   Title  Patient ambulates100'  around furniture carrying plate or cup with cane or less & prostheses modified independent for household mobility.     Time  3    Period  Months    Status  New      Additional Long Term Goals   Additional Long Term Goals  Yes      PT LONG TERM GOAL #6   Title  Patient negotiates ramps, curbs & stairs (1 rail) with cane or less & prostheses modified independent for community access.     Time  3    Period  Months    Status  New      PT LONG TERM GOAL #7   Title  Pt will improve endurance during gait by increasing distance on 6 minute walk test by 150' with LRAD    Baseline  451 in 4 minutes; will re-assess at 4 weeks    Time  3    Period  Months    Status  New    Target Date  08/14/18            Plan - 06/22/18 2241    Clinical Impression Statement  Patient tolerated limited activities with oxygen saturation dropping to lower 90's except one to 89% which recovered within 1 mintue.  Pt reports understanding of vacuum seal bags to enable use of prostheses in pool which is his goal.     Rehab Potential  Good    PT Frequency  2x / week 13 weeks (90 days)    PT Duration  Other (comment)    PT Treatment/Interventions  ADLs/Self Care Home Management;Canalith Repostioning;DME Instruction;Gait training;Stair training;Functional mobility training;Therapeutic activities;Therapeutic exercise;Balance training;Patient/family education;Prosthetic Training;Vestibular    PT Next Visit Plan  Monitor HR & SpO2 for activities.  curb negotiation training with just UE support on cane - needs to work on proximal hip strengthening.  Gait over variety of surfaces with cane.  Endurance and standing balance with decreased UE support    Consulted and Agree with Plan of Care  Patient       Patient will benefit from skilled therapeutic intervention in order to improve the following deficits and impairments:   Abnormal gait, Cardiopulmonary status limiting activity, Decreased activity tolerance, Decreased balance, Decreased endurance, Decreased mobility, Decreased knowledge of use of DME, Decreased range of motion, Decreased scar mobility, Decreased strength, Postural dysfunction, Prosthetic Dependency  Visit Diagnosis: Unsteadiness on feet  Other abnormalities of gait and mobility  Muscle weakness (generalized)  History of falling  Abnormal posture     Problem List Patient Active Problem List  Diagnosis Date Noted  . Non-ST elevation (NSTEMI) myocardial infarction ([place])   . Persistent atrial fibrillation with rapid ventricular response ([place]) 06/15/2018  . Chest pain with moderate risk for cardiac etiology 06/15/2018  . Atrial flutter ([place]) 06/15/2018  . Somniloquy 06/12/2018  . Encounter for therapeutic drug monitoring 05/27/2018  . Chronic systolic heart failure ([place])   . Demand ischemia ([place])   . Atrial fibrillation with RVR ([place]) 05/02/2018  . Murmur, cardiac 02/13/2018  . Mallory-Weiss tear 01/16/2018  . Morbid obesity ([place])   . Fall   . Subtherapeutic international normalized ratio (INR)   . History of left below knee amputation ([place]) 12/11/2017  . History of supraventricular tachycardia   . S/P bilateral BKA (below knee amputation) ([place])   . Anemia of chronic disease   . Leukocytosis   . Non-ischemic cardiomyopathy ([place])   . Hypokalemia 09/04/2017  . Essential hypertension 09/03/2017  . GERD (gastroesophageal reflux disease) 09/03/2017  . GIB (gastrointestinal bleeding) 07/26/2017  . Anemia due to end stage renal disease ([place]) 07/26/2017  . PAF (paroxysmal atrial fibrillation) ([place]) 07/26/2017  . Symptomatic anemia 07/26/2017  . End stage renal disease ([place]) 05/05/2017  . HIV disease ([place]) 03/25/2017  . Hypertensive heart disease with end stage renal disease on dialysis ([place]) 03/13/2017  . ESRD on dialysis ([place]) 03/13/2017  . Type 2 diabetes mellitus with  complication ([place]) 76/28/3151  . SVT (supraventricular tachycardia) ([place])     Ansh Fauble PT, DPT 06/23/2018, 10:44 PM  [facility] [address] [place], [place], [address] Phone: [phone]   Fax:  [phone]  Name: Darryn Kydd MRN: [id number] Date of Birth: [birth year]

## 2018-06-24 ENCOUNTER — Ambulatory Visit: Payer: Medicare Other | Admitting: Physical Therapy

## 2018-06-24 ENCOUNTER — Encounter: Payer: Self-pay | Admitting: Physical Therapy

## 2018-06-24 VITALS — HR 83

## 2018-06-24 DIAGNOSIS — R293 Abnormal posture: Secondary | ICD-10-CM

## 2018-06-24 DIAGNOSIS — Z9181 History of falling: Secondary | ICD-10-CM

## 2018-06-24 DIAGNOSIS — M6281 Muscle weakness (generalized): Secondary | ICD-10-CM

## 2018-06-24 DIAGNOSIS — R2689 Other abnormalities of gait and mobility: Secondary | ICD-10-CM

## 2018-06-24 DIAGNOSIS — R2681 Unsteadiness on feet: Secondary | ICD-10-CM

## 2018-06-24 NOTE — Therapy (Signed)
[facility] [address] [place], [place], [address] Phone: [phone]   Fax:  [phone]  Physical Therapy Treatment  Patient Details  Name: Thomas [name] MRN: [id number] Date of Birth: 21-Apr-1966 Referring Provider: Meridee Score, MD   Encounter Date: 06/24/2018  PT End of Session - 06/24/18 1300    Visit Number  7    Number of Visits  26    Date for PT Re-Evaluation  08/14/18    Authorization Type  Medicare & Generic commercial    PT Start Time  1150    PT Stop Time  1228    PT Time Calculation (min)  38 min    Equipment Utilized During Treatment  Gait belt    Activity Tolerance  Patient tolerated treatment well    Behavior During Therapy  WFL for tasks assessed/performed       Past Medical History:  Diagnosis Date  . Anemia   . Atrial fibrillation ([place])   . Chest pain with moderate risk for cardiac etiology 06/15/2018  . Chronic combined systolic and diastolic CHF (congestive heart failure) ([place])   . Diabetic foot ulcer ([place]) 09/03/2017  . Diabetic wet gangrene of the foot ([place]) 09/04/2017  . ESRD (end stage renal disease) on dialysis [facility])    [place] [address] [place], [place], [place] (06/15/2018)  . Headache    "monthly" (06/15/2018)  . HIV disease ([place])   . Hyperparathyroidism, secondary ([place])   . Hypertension   . Hypertensive heart disease with end stage renal disease on dialysis ([place]) 03/13/2017  . Pneumonia X 1  . Subacute osteomyelitis, right ankle and foot ([place])   . SVT (supraventricular tachycardia) ([place])    ? afib or atrial flutter s/p TEE/DCCV with subsequent ablation due to reoccurrence in [place]  . Type 2 diabetes mellitus ([place])     Past Surgical History:  Procedure Laterality Date  . AMPUTATION Left    foot  . AMPUTATION Right 12/06/2017   Procedure: AMPUTATION BELOW KNEE;  Surgeon: [name], MD;  Location: [facility];  Service: Orthopedics;  Laterality: Right;  . APPLICATION OF WOUND VAC Right 09/04/2017   Procedure: APPLICATION OF WOUND VAC;  Surgeon: [name], DPM;  Location: [facility];  Service: Podiatry;  Laterality: Right;  . APPLICATION OF WOUND VAC  12/06/2017   Procedure: APPLICATION OF WOUND VAC;  Surgeon: [name], MD;  Location: [facility];  Service: Orthopedics;;  . AV FISTULA PLACEMENT Right 10/19/2012  . AV FISTULA REPAIR Right ~ 02/2018   "had it cleaned out"  . BELOW KNEE LEG AMPUTATION Left ~ 2016  . COLONOSCOPY WITH PROPOFOL N/A 07/30/2017   Procedure: COLONOSCOPY WITH PROPOFOL;  Surgeon: [name], MD;  Location: [facility];  Service: Endoscopy;  Laterality: N/A;  . ESOPHAGOGASTRODUODENOSCOPY (EGD) WITH PROPOFOL N/A 07/28/2017   Procedure: ESOPHAGOGASTRODUODENOSCOPY (EGD) WITH PROPOFOL;  Surgeon: [name], MD;  Location: [facility];  Service: Endoscopy;  Laterality: N/A;  . ESOPHAGOGASTRODUODENOSCOPY (EGD) WITH PROPOFOL N/A 01/15/2018   Procedure: ESOPHAGOGASTRODUODENOSCOPY (EGD) WITH PROPOFOL;  Surgeon: [name], MD;  Location: [facility] ENDOSCOPY;  Service: Endoscopy;  Laterality: N/A;  . FLEXIBLE SIGMOIDOSCOPY N/A 07/27/2017   Procedure: FLEXIBLE SIGMOIDOSCOPY;  Surgeon: [name], MD;  Location: [facility] ENDOSCOPY;  Service: Endoscopy;  Laterality: N/A;  . GRAFT APPLICATION Right 20/35/5974   Procedure: SKIN GRAFT APPLICATION RIGHT FOOT;  Surgeon: [name], DPM;  Location: [facility];  Service: Podiatry;  Laterality: Right;  . I&D EXTREMITY Right  09/04/2017   Procedure: IRRIGATION AND DEBRIDEMENT EXTREMITY;  Surgeon: [name], DPM;  Location: [facility];  Service: Podiatry;  Laterality: Right;  . I&D EXTREMITY Right 11/12/2017   Procedure: IRRIGATION AND DEBRIDEMENT ULCER RIGHT FOOT;  Surgeon: [name], DPM;  Location: [facility];  Service: Podiatry;  Laterality: Right;  . LEFT HEART CATH AND CORONARY ANGIOGRAPHY N/A 06/18/2018   Procedure: LEFT HEART CATH AND CORONARY ANGIOGRAPHY;  Surgeon: [name], Peter M, MD;  Location: [facility];  Service:  Cardiovascular;  Laterality: N/A;  . WOUND DEBRIDEMENT N/A 09/24/2017   Procedure: DEBRIDEMENT WOUND;  Surgeon: [name], DPM;  Location: [facility];  Service: Podiatry;  Laterality: N/A;    Vitals:   06/24/18 1151  Pulse: 83  SpO2: 93%    Subjective Assessment - 06/24/18 1152    Subjective  Doing well today. Planning to go swimming soon.    Pertinent History  R TTA, ESRD, DM2, HIV, cardiomyopathy, CHF, Tachycardia, A-Fib, Mallory-Weiss Tear, obesity    Limitations  Standing;Walking;House hold activities    Patient Stated Goals  To use bilateral prostheses to dance with wife & swimming,     Currently in Pain?  No/denies                       [facility] Adult PT Treatment/Exercise - 06/24/18 0001      Transfers   Transfers  Sit to Stand;Stand to Sit    Sit to Stand  5: Supervision;With upper extremity assist;From chair/3-in-1    Stand to Sit  5: Supervision;With upper extremity assist;To chair/3-in-1      Ambulation/Gait   Ambulation/Gait  Yes    Ambulation/Gait Assistance  4: Min guard;5: Supervision    Ambulation/Gait Assistance Details  Balance and cues for slow controlled movement with figure 8 turns, stepping over low barriers, sidestepping and retro gait on compliant surface. All performed with cane.                                    Ambulation Distance (Feet)  50 Feet x8    Assistive device  Straight cane;Prostheses    Gait Pattern  Step-through pattern;Trunk flexed    Ambulation Surface  Level;Unlevel;Indoor compliant mats.    Stairs  Yes    Stairs Assistance  5: Supervision    Stairs Assistance Details (indicate cue type and reason)  Ascending: 1 rail with cane,alternate lead limb for BLE functional strength. Descending: 2 rails with feet slightly turned to he side.                                  Stair Management Technique  Two rails;Step to pattern;Forwards;With cane    Number of Stairs  4 x2    Ramp  4: Min assist    Ramp Details (indicate cue type  and reason)  with cane; cues for safe weight shifting    Gait Comments  HR 81-90 bpm with gait and SpO2 91-92% except stepping over barriers SpO2 89% with recovery >/= 94% within 1 minute      Prosthetics   Prosthetic Care Comments   Reviewed instruction for use of [employer] antiperspirant for sweat, increasing wear to include after nightly bath,  consider switching liners with bathing & washing liners at that time.  Pt does not put prostheses  back on after nightly bath at this time.              Current prosthetic wear tolerance (days/week)   daily    Current prosthetic wear tolerance (#hours/day)   most awake hours for bil prostheses, removing to dry as needed with rest about half way through day    Residual limb condition   no open areas, normal color, temperature    Education Provided  Skin check;Residual limb care;Proper wear schedule/adjustment;Other (comment)    Person(s) Educated  Patient    Education Method  Demonstration;Explanation    Education Method  Verbalized understanding             PT Education - 06/24/18 1259    Education provided  Yes    Education Details  Discussed POC rationale and LTGs    Person(s) Educated  Patient    Methods  Explanation    Comprehension  Verbalized understanding       PT Short Term Goals - 06/22/18 2226      PT SHORT TERM GOAL #1   Title  Patient verbalizes proper limb care for sweat management. (All STGs Target Date: 06/17/2018)    Baseline  MET 06/22/2018    Time  1    Period  Months    Status  Achieved      PT SHORT TERM GOAL #2   Title  Patient verbalizes options for pool with & without prostheses to address his goal.     Baseline  MET 06/22/2018    Time  1    Period  Months    Status  Achieved      PT SHORT TERM GOAL #3   Title  Patient performs standing balance with cane support reaching 10" & to floor with supervision.     Baseline  Can reach all the way to the floor and out 12" with support on cane and  supervision    Time  1    Period  Months    Status  Achieved      PT SHORT TERM GOAL #4   Title  Patient ambulates 200' with cane & prostheses with supervision.     Baseline  230' with cane and prostheses with supervision until pt fatigued and required min A to regain balance at end of walk, 89%, 93 bpm after two laps    Time  1    Period  Months    Status  Partially Met      PT SHORT TERM GOAL #5   Title  Patient negotiates stairs with 1 rails(to exit/enter friend's house), curbs & ramps with cane & prostheses with minA.     Baseline  stairs with min A with cane; ramp mod A, curb unable with cane as only support    Time  1    Period  Months    Status  Partially Met      PT SHORT TERM GOAL #6   Title  Pt will improve endurance to be able to complete 6 min walk test with 2 standing rest breaks and more upright trunk posture.    Baseline  Completed 4 minutes, 2-3 standing rest breaks    Time  1    Period  Months    Status  Partially Met        PT Long Term Goals - 05/20/18 1513      PT LONG TERM GOAL #1   Title  Patient verbalizes & demonstrates proper prosthetic care  including prostheses with dialysis to enable safe use of bilateral Transtibial prostheses. (All LTGs Target Date: 08/14/2018)    Time  3    Period  Months    Status  New      PT LONG TERM GOAL #2   Title  Patient tolerates wear of bilateral Transtibial prostheses >90% of awake hours without skin issues or limb pain to enable function during most of his day.     Time  3    Period  Months    Status  New      PT LONG TERM GOAL #3   Title  Standing balance with cane or less support reaching 10" anteriorly, to floor, scanning & managing clothes modified independent.     Time  3    Period  Months    Status  New      PT LONG TERM GOAL #4   Title  Patient ambulates 500' outdoors including grass with cane or less & bilateral prostheses modified independent for community mobility.     Time  3    Period  Months     Status  New      PT LONG TERM GOAL #5   Title  Patient ambulates100'  around furniture carrying plate or cup with cane or less & prostheses modified independent for household mobility.     Time  3    Period  Months    Status  New      Additional Long Term Goals   Additional Long Term Goals  Yes      PT LONG TERM GOAL #6   Title  Patient negotiates ramps, curbs & stairs (1 rail) with cane or less & prostheses modified independent for community access.     Time  3    Period  Months    Status  New      PT LONG TERM GOAL #7   Title  Pt will improve endurance during gait by increasing distance on 6 minute walk test by 150' with LRAD    Baseline  451 in 4 minutes; will re-assess at 4 weeks    Time  3    Period  Months    Status  New    Target Date  08/14/18            Plan - 06/24/18 1301    Clinical Impression Statement  Pt came into therapy today with SPC (quad tip) only.  Dynamic gait and balance training perfomed with cane; pt requiring supervision to intermittent min guard for turns, gait activities on mats, and Min A due to feeling dizzy after stepping over barriers and ramp negotiation .   Pt continues to fatigue quickly requiring seated breaks throughout session. Oxygen saturation dropping to lower 90's except one to 89% which recovered within 1 mintue.                                                         Rehab Potential  Good    PT Frequency  2x / week 13 weeks (90 days)    PT Duration  Other (comment)    PT Treatment/Interventions  ADLs/Self Care Home Management;Canalith Repostioning;DME Instruction;Gait training;Stair training;Functional mobility training;Therapeutic activities;Therapeutic exercise;Balance training;Patient/family education;Prosthetic Training;Vestibular    PT Next Visit Plan  Monitor HR & SpO2 for  activities.  curb negotiation training with just UE support on cane - needs to work on proximal hip strengthening.  Gait over variety of surfaces with cane.   Endurance and standing balance with decreased UE support    Consulted and Agree with Plan of Care  Patient       Patient will benefit from skilled therapeutic intervention in order to improve the following deficits and impairments:  Abnormal gait, Cardiopulmonary status limiting activity, Decreased activity tolerance, Decreased balance, Decreased endurance, Decreased mobility, Decreased knowledge of use of DME, Decreased range of motion, Decreased scar mobility, Decreased strength, Postural dysfunction, Prosthetic Dependency  Visit Diagnosis: Unsteadiness on feet  Other abnormalities of gait and mobility  Muscle weakness (generalized)  Abnormal posture  History of falling     Problem List Patient Active Problem List   Diagnosis Date Noted  . Non-ST elevation (NSTEMI) myocardial infarction ([place])   . Persistent atrial fibrillation with rapid ventricular response ([place]) 06/15/2018  . Chest pain with moderate risk for cardiac etiology 06/15/2018  . Atrial flutter ([place]) 06/15/2018  . Somniloquy 06/12/2018  . Encounter for therapeutic drug monitoring 05/27/2018  . Chronic systolic heart failure ([place])   . Demand ischemia ([place])   . Atrial fibrillation with RVR ([place]) 05/02/2018  . Murmur, cardiac 02/13/2018  . Mallory-Weiss tear 01/16/2018  . Morbid obesity ([place])   . Fall   . Subtherapeutic international normalized ratio (INR)   . History of left below knee amputation ([place]) 12/11/2017  . History of supraventricular tachycardia   . S/P bilateral BKA (below knee amputation) ([place])   . Anemia of chronic disease   . Leukocytosis   . Non-ischemic cardiomyopathy ([place])   . Hypokalemia 09/04/2017  . Essential hypertension 09/03/2017  . GERD (gastroesophageal reflux disease) 09/03/2017  . GIB (gastrointestinal bleeding) 07/26/2017  . Anemia due to end stage renal disease ([place]) 07/26/2017  . PAF (paroxysmal atrial fibrillation) ([place]) 07/26/2017  . Symptomatic anemia 07/26/2017  . End  stage renal disease ([place]) 05/05/2017  . HIV disease ([place]) 03/25/2017  . Hypertensive heart disease with end stage renal disease on dialysis ([place]) 03/13/2017  . ESRD on dialysis ([place]) 03/13/2017  . Type 2 diabetes mellitus with complication ([place]) 95/97/4718  . SVT (supraventricular tachycardia) ([place])     Bjorn Loser, PTA  06/24/18, 1:11 PM [facility] [address] [place], [place], [address] Phone: [phone]   Fax:  [phone]  Name: Oshea Percival MRN: [id number] Date of Birth: 18-Dec-1966

## 2018-06-24 NOTE — Telephone Encounter (Signed)
He is medically stable to participate in PT

## 2018-06-29 MED FILL — BIKTARVY 50-200-25 MG TABS: 50-200-25 | 30 days supply | Qty: 30 | Fill #4

## 2018-06-30 NOTE — Progress Notes (Signed)
Cardiology Office Note   Date:  07/01/2018   ID:  Thomas [name], DOB 1966-12-26, MRN [id number]  PCP:  [name], MD  Cardiologist:  Dr. [name]    Chief Complaint  Patient presents with  . Hospitalization Follow-up    atrial flutter       History of Present Illness: Thomas [name] is a 52 y.o. male who presents for post hospital for chest pain. Also 5th admit for symptomatic atreal tachyarrthymia,  Usually converts spontaneous but did have one DCCV.  Difficult to control due to hypotension with dialysis.  On last admit a flutter,  IV amiodarone was started.   Prolonged Qt.  May need EP referral for ablation.    Discharged on amiodarone 400 daily and will need to reduce to 200 mg now.    Per Dr [name] at cath "I would recommend medical management. Other than the mid LAD and first diagonal the other disease is in smaller distal branches. The diagonal is occluded. The mid LAD could potentially be treated with atherectomy and stenting but I would not advise this unless he has refractory angina on optimal medical therapy and treatment of his arrhythmia. I also don't think this would add much to his overall prognosis. If no bleeding from cath site can resume coumadin tomorrow. ?  He has a history of DM, ESRD, HIV, HTN followed by Dr. [name].  He has a hx. of SVT and palpitations.  On prior visits he stated that the palpitations started about 6 months prior to visit.  He only had it happen 2 times.  He went to HD and his heart was beating fast and went to the ER.  He had some type of SVT and had what sounds like from his description as afib or flutter and had a TEE/DCCV and about 2 weeks later had reoccurrence of the arrhythmia and had an ablation.  He has a CHADS2VASC score of 2 and is on chronic anticoagulation with warfarin.    ESRD on HD.  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   May need lorsartan if BP > 130/80 per Dr. [name]. [name]  Today has no complaints, no rapid HR, no chest pain  and no SOB.  He has been feeling well.  He is doing well on amiodarone.  His diabetes has been controlled as well.  He has not had an INR checked since discharge.  Will do today.   Past Medical History:  Diagnosis Date  . Anemia   . Atrial fibrillation ([place])   . Chest pain with moderate risk for cardiac etiology 06/15/2018  . Chronic combined systolic and diastolic CHF (congestive heart failure) ([place])   . Diabetic foot ulcer ([place]) 09/03/2017  . 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 ESRD (end stage renal disease) on dialysis [facility])    [address] [place], [place] (06/15/2018)  . Headache    "monthly" (06/15/2018)  . HIV disease ([place])   . Hyperparathyroidism, secondary ([place])   . Hypertension   . Hypertensive heart disease with end stage renal disease on dialysis ([place]) 03/13/2017  . Pneumonia X 1  . Subacute osteomyelitis, right ankle and foot ([place])   . SVT (supraventricular tachycardia) ([place])    ? afib or atrial flutter s/p TEE/DCCV with subsequent ablation due to reoccurrence in [place]  . Type 2 diabetes mellitus ([place])     Past Surgical History:  Procedure Laterality Date  . AMPUTATION Left  foot  . AMPUTATION Right 12/06/2017   Procedure: AMPUTATION BELOW KNEE;  Surgeon: [name], MD;  Location: [facility];  Service: Orthopedics;  Laterality: Right;  . APPLICATION OF WOUND VAC Right 09/04/2017   Procedure: APPLICATION OF WOUND VAC;  Surgeon: [name], DPM;  Location: [facility];  Service: Podiatry;  Laterality: Right;  . APPLICATION OF WOUND VAC  12/06/2017   Procedure: APPLICATION OF WOUND VAC;  Surgeon: [name], MD;  Location: [facility];  Service: Orthopedics;;  . AV FISTULA PLACEMENT Right 10/19/2012  . AV FISTULA REPAIR Right ~ 02/2018   "had it cleaned out"  . BELOW KNEE LEG AMPUTATION Left ~ 2016  . COLONOSCOPY WITH PROPOFOL N/A 07/30/2017   Procedure: COLONOSCOPY WITH PROPOFOL;  Surgeon: [name], MD;  Location: [facility];  Service: Endoscopy;   Laterality: N/A;  . ESOPHAGOGASTRODUODENOSCOPY (EGD) WITH PROPOFOL N/A 07/28/2017   Procedure: ESOPHAGOGASTRODUODENOSCOPY (EGD) WITH PROPOFOL;  Surgeon: [name], MD;  Location: [facility];  Service: Endoscopy;  Laterality: N/A;  . ESOPHAGOGASTRODUODENOSCOPY (EGD) WITH PROPOFOL N/A 01/15/2018   Procedure: ESOPHAGOGASTRODUODENOSCOPY (EGD) WITH PROPOFOL;  Surgeon: [name], MD;  Location: [facility] ENDOSCOPY;  Service: Endoscopy;  Laterality: N/A;  . FLEXIBLE SIGMOIDOSCOPY N/A 07/27/2017   Procedure: FLEXIBLE SIGMOIDOSCOPY;  Surgeon: [name], MD;  Location: [facility] ENDOSCOPY;  Service: Endoscopy;  Laterality: N/A;  . GRAFT APPLICATION Right 03/47/4259   Procedure: SKIN GRAFT APPLICATION RIGHT FOOT;  Surgeon: [name], DPM;  Location: [facility];  Service: Podiatry;  Laterality: Right;  . I&D EXTREMITY Right 09/04/2017   Procedure: IRRIGATION AND DEBRIDEMENT EXTREMITY;  Surgeon: [name], DPM;  Location: [facility];  Service: Podiatry;  Laterality: Right;  . I&D EXTREMITY Right 11/12/2017   Procedure: IRRIGATION AND DEBRIDEMENT ULCER RIGHT FOOT;  Surgeon: [name], DPM;  Location: [facility];  Service: Podiatry;  Laterality: Right;  . LEFT HEART CATH AND CORONARY ANGIOGRAPHY N/A 06/18/2018   Procedure: LEFT HEART CATH AND CORONARY ANGIOGRAPHY;  Surgeon: [name], Peter M, MD;  Location: [facility];  Service: Cardiovascular;  Laterality: N/A;  . WOUND DEBRIDEMENT N/A 09/24/2017   Procedure: DEBRIDEMENT WOUND;  Surgeon: [name], DPM;  Location: [facility];  Service: Podiatry;  Laterality: N/A;     Current Outpatient Medications  Medication Sig Dispense Refill  . amiodarone (PACERONE) 200 MG tablet Take 2 tablets (400 mg total) by mouth daily. 60 tablet 0  . bictegravir-emtricitabine-tenofovir AF (BIKTARVY) 50-200-25 MG TABS tablet Take 1 tablet by mouth daily. 30 tablet 11  . camphor-menthol (SARNA) lotion Apply topically as needed for itching. 222 mL 0  . cinacalcet  (SENSIPAR) 90 MG tablet Take 90 mg by mouth daily.  6  . ferric gluconate 125 mg in sodium chloride 0.9 % 100 mL Inject 125 mg into the vein Every Tuesday,Thursday,and Saturday with dialysis.    Marland Kitchen gabapentin (NEURONTIN) 100 MG capsule Take 1 capsule (100 mg total) by mouth every dialysis. 15 capsule 1  . insulin glargine (LANTUS) 100 unit/mL SOPN Inject 0.3 mLs (30 Units total) into the skin at bedtime. (Patient taking differently: Inject 20 Units into the skin at bedtime. ) 15 mL 11  . metoprolol succinate (TOPROL XL) 100 MG 24 hr tablet Take 1 tablet (100 mg total) by mouth daily. Take with or immediately following a meal. 30 tablet 0  . pantoprazole (PROTONIX) 40 MG tablet Take 2 tablets (80 mg total) by mouth daily. 60 tablet 1  . polycarbophil (FIBERCON) 625  MG tablet Take 1 tablet (625 mg total) by mouth daily. 30 tablet 0  . rosuvastatin (CRESTOR) 20 MG tablet Take 1 tablet (20 mg total) by mouth daily at 6 PM. 30 tablet 1  . sevelamer carbonate (RENVELA) 800 MG tablet Take 800 mg by mouth 3 (three) times daily with meals.     . warfarin (COUMADIN) 7.5 MG tablet Take 1 tablet daily except 1/2 tablet on Sundays or as directed by Anticoagulation Clinic (Patient taking differently: Take 7.5 mg by mouth See admin instructions. Take 7.5mg  daily except Sunday take 3.75mg ) 35 tablet 1   No current facility-administered medications for this visit.     Allergies:   Oxycodone-acetaminophen    Social History:  The patient  reports that he has never smoked. He has never used smokeless tobacco. He reports that he does not drink alcohol or use drugs.   Family History:  The patient's family history includes Diabetes in his mother; Heart disease in his father and mother.    ROS:  General:no colds or fevers, + weight increase, but today with both prosthesis in place. Skin:no rashes or ulcers HEENT:no blurred vision, no congestion CV:see HPI PUL:see HPI GI:no diarrhea constipation or melena, no  indigestion GU:no hematuria, no dysuria MS:no joint pain, no claudication Neuro:no syncope, no lightheadedness Endo:+ diabetes, no thyroid disease  Wt Readings from Last 3 Encounters:  07/01/18 290 lb (131.5 kg)  06/18/18 272 lb 7.8 oz (123.6 kg)  06/12/18 290 lb 6.4 oz (131.7 kg)     PHYSICAL EXAM: VS:  BP 138/68   Pulse 89   Ht 6\' 6"  (1.981 m)   Wt 290 lb (131.5 kg)   SpO2 92%   BMI 33.51 kg/m  , BMI Body mass index is 33.51 kg/m. General:Pleasant affect, NAD Skin:Warm and dry, brisk capillary refill HEENT:normocephalic, sclera clear, mucus membranes moist Neck:supple, no JVD, no bruits  Heart:S1S2 RRR without murmur, gallup, rub or click, AV graft Rt lower arm, + thrill and bruit Lungs:clear without rales, rhonchi, or wheezes HUD:JSHF, non tender, + BS, do not palpate liver spleen or masses Ext:no lower ext edema of thighs + bil BKA  2+ radial pulses Neuro:alert and oriented X 3, MAE, follows commands, + facial symmetry    EKG:  EKG is ordered today. The ekg ordered today demonstrates SR with 1st degree AV block Qtc of 502 ms less than 533 in hospital and is on amiodarone.     Recent Labs: 05/02/2018: TSH 1.348 06/04/2018: ALT 18 06/17/2018: Magnesium 2.2 06/19/2018: BUN 41; Creatinine, Ser 8.14; Hemoglobin 9.8; Platelets 203; Potassium 3.8; Sodium 135    Lipid Panel    Component Value Date/Time   CHOL 202 (H) 06/18/2018 0338   TRIG 596 (H) 06/18/2018 0338   HDL 20 (L) 06/18/2018 0338   CHOLHDL 10.1 06/18/2018 0338   VLDL UNABLE TO CALCULATE IF TRIGLYCERIDE OVER 400 mg/dL 06/18/2018 0338   LDLCALC UNABLE TO CALCULATE IF TRIGLYCERIDE OVER 400 mg/dL 06/18/2018 0263       Other studies Reviewed: Additional studies/ records that were reviewed today include: . Cardiac cath 06/18/18   Procedures   LEFT HEART CATH AND CORONARY ANGIOGRAPHY  Conclusion     Prox LAD lesion is 30% stenosed.  Mid LAD lesion is 80% stenosed.  Ost 3rd Diag lesion is 95%  stenosed.  Dist LAD lesion is 90% stenosed.  Ost 1st Diag lesion is 100% stenosed.  Acute Mrg lesion is 95% stenosed.  1st RPLB lesion is 100% stenosed.  Post Atrio lesion is 95% stenosed.  There is mild to moderate left ventricular systolic dysfunction.  LV end diastolic pressure is normal.   1. Severe 2 vessel obstructive CAD. The entire coronary tree is severely calcified.    - 100% large first diagonal vessel. This fills late by left to left collaterals.    - 80% eccentric mid LAD    - 90% distal LAD    - 95% small third diagonal    - 95% RV marginal branch.    - 100% first PLOM    - 95% ongoing PL branch of the RCA 2. Mild to moderate LV dysfunction with global hypokinesis. EF estimated at 40-45% 3. Normal LVEDP  Plan: I would recommend medical management. Other than the mid LAD and first diagonal the other disease is in smaller distal branches. The diagonal is occluded. The mid LAD could potentially be treated with atherectomy and stenting but I would not advise this unless he has refractory angina on optimal medical therapy and treatment of his arrhythmia. I also don't think this would add much to his overall prognosis. If no bleeding from cath site can resume coumadin tomorrow.     Echo 05/07/17  Study Conclusions  - Left ventricle: The cavity size was moderately dilated. Wall   thickness was increased in a pattern of moderate LVH. Systolic   function was moderately reduced. The estimated ejection fraction   was in the range of 35% to 40%. Akinesis of the inferior   myocardium. Features are consistent with a pseudonormal left   ventricular filling pattern, with concomitant abnormal relaxation   and increased filling pressure (grade 2 diastolic dysfunction). - Aortic valve: There was mild regurgitation. - Mitral valve: There was mild regurgitation. - Left atrium: The atrium was severely dilated. - Right atrium: The atrium was mildly dilated.   ASSESSMENT AND  PLAN:  1.  Atrial flutter, resolved on amiodarone currently 400 mg daily but will decrease to 200 mg daily on 75/19.  On next visit he will need TSH, CMP to follow.  Anticoagulation on coumadin, for INR check today. No bleeding.   2.   Chest pain with demand ischemia when in tachycardia.  Currently no pain.  3.   CAD, several severe stenoses, in small vessels,  far downstream.  Heavily calcified atherosclerotic lesions are seen throughout the coronary tree, but there is no indication for revascularization at this time.  Needs aggressive risk factor management.  Now on Crestor.   4.   ESRD on HD --TTS --BP is elevated at 138/68 today Dr. [name]. [name] considered adding losartan if BP remain > 130/80.  Will have him follow up in 6 weeks and may add at that time if still elevated.   --they are doing his dialysis differently so his BP is not dropping the way it was.    5.  Chronic systolic HF , decompensation in hospital thought to be due to HR.  Euvolemic today   6.  Bilateral BKA and wears prosthesis.   7.  Prolonged QT stable at 502 down from 533 ms.    8.  HLD started on Crestor and will need lipid panel on next visit.       Current medicines are reviewed with the patient today.  The patient Has no concerns regarding medicines.  The following changes have been made:  See above Labs/ tests ordered today include:see above  Disposition:   FU:  see above  Signed, Cecilie Kicks, NP  07/01/2018  [address] [place], [place], [place] Phone: [phone]; Fax: [phone]

## 2018-07-01 ENCOUNTER — Ambulatory Visit (INDEPENDENT_AMBULATORY_CARE_PROVIDER_SITE_OTHER): Payer: Medicare Other | Admitting: Cardiology

## 2018-07-01 ENCOUNTER — Ambulatory Visit (INDEPENDENT_AMBULATORY_CARE_PROVIDER_SITE_OTHER): Payer: Self-pay | Admitting: *Deleted

## 2018-07-01 ENCOUNTER — Encounter: Payer: Self-pay | Admitting: Cardiology

## 2018-07-01 ENCOUNTER — Ambulatory Visit: Payer: Medicare Other | Attending: Orthopedic Surgery | Admitting: Physical Therapy

## 2018-07-01 VITALS — BP 138/68 | HR 89 | Ht 78.0 in | Wt 290.0 lb

## 2018-07-01 DIAGNOSIS — R2681 Unsteadiness on feet: Secondary | ICD-10-CM | POA: Insufficient documentation

## 2018-07-01 DIAGNOSIS — Z9181 History of falling: Secondary | ICD-10-CM | POA: Insufficient documentation

## 2018-07-01 DIAGNOSIS — I5042 Chronic combined systolic (congestive) and diastolic (congestive) heart failure: Secondary | ICD-10-CM | POA: Diagnosis not present

## 2018-07-01 DIAGNOSIS — R9431 Abnormal electrocardiogram [ECG] [EKG]: Secondary | ICD-10-CM | POA: Diagnosis not present

## 2018-07-01 DIAGNOSIS — I4892 Unspecified atrial flutter: Secondary | ICD-10-CM

## 2018-07-01 DIAGNOSIS — Z89512 Acquired absence of left leg below knee: Secondary | ICD-10-CM | POA: Diagnosis not present

## 2018-07-01 DIAGNOSIS — I471 Supraventricular tachycardia, unspecified: Secondary | ICD-10-CM

## 2018-07-01 DIAGNOSIS — Z89511 Acquired absence of right leg below knee: Secondary | ICD-10-CM | POA: Diagnosis not present

## 2018-07-01 DIAGNOSIS — R293 Abnormal posture: Secondary | ICD-10-CM | POA: Insufficient documentation

## 2018-07-01 DIAGNOSIS — Z5181 Encounter for therapeutic drug level monitoring: Secondary | ICD-10-CM

## 2018-07-01 DIAGNOSIS — Z992 Dependence on renal dialysis: Secondary | ICD-10-CM | POA: Diagnosis not present

## 2018-07-01 DIAGNOSIS — I2510 Atherosclerotic heart disease of native coronary artery without angina pectoris: Secondary | ICD-10-CM | POA: Diagnosis not present

## 2018-07-01 DIAGNOSIS — E782 Mixed hyperlipidemia: Secondary | ICD-10-CM | POA: Diagnosis not present

## 2018-07-01 DIAGNOSIS — M6281 Muscle weakness (generalized): Secondary | ICD-10-CM | POA: Insufficient documentation

## 2018-07-01 DIAGNOSIS — I1311 Hypertensive heart and chronic kidney disease without heart failure, with stage 5 chronic kidney disease, or end stage renal disease: Secondary | ICD-10-CM

## 2018-07-01 DIAGNOSIS — N186 End stage renal disease: Secondary | ICD-10-CM | POA: Diagnosis not present

## 2018-07-01 DIAGNOSIS — R2689 Other abnormalities of gait and mobility: Secondary | ICD-10-CM | POA: Insufficient documentation

## 2018-07-01 DIAGNOSIS — I4891 Unspecified atrial fibrillation: Secondary | ICD-10-CM

## 2018-07-01 LAB — POCT INR: INR: 3 (ref 2.0–3.0)

## 2018-07-01 MED ORDER — AMIODARONE HCL 200 MG PO TABS: 200.0000 mg | ORAL_TABLET | Freq: Every day | ORAL | 11 refills | Status: DC

## 2018-07-01 NOTE — Patient Instructions (Signed)
Medication Instructions: Your physician has recommended you make the following change in your medication:  DECREASE: Amiodarone to 200 mg daily starting 07/03/18 (Friday)   Labwork: None Ordered  Procedures/Testing: None Ordered  Follow-Up: Your physician recommends that you schedule a follow-up appointment in: 4-6 weeks with Dr.Turner    Any Additional Special Instructions Will Be Listed Below (If Applicable).     If you need a refill on your cardiac medications before your next appointment, please call your pharmacy.

## 2018-07-01 NOTE — Patient Instructions (Signed)
Description   Continue taking 1 tablet daily except 1/2 tablet on Sundays, Tuesdays and Fridays. Recheck INR in 1 week. [facility] [phone] Main [phone]

## 2018-07-02 ENCOUNTER — Other Ambulatory Visit (INDEPENDENT_AMBULATORY_CARE_PROVIDER_SITE_OTHER): Payer: Self-pay | Admitting: Orthopedic Surgery

## 2018-07-03 ENCOUNTER — Ambulatory Visit: Payer: Medicare Other | Admitting: Physical Therapy

## 2018-07-03 ENCOUNTER — Encounter: Payer: Self-pay | Admitting: Physical Therapy

## 2018-07-03 VITALS — HR 84

## 2018-07-03 DIAGNOSIS — R2689 Other abnormalities of gait and mobility: Secondary | ICD-10-CM

## 2018-07-03 DIAGNOSIS — R2681 Unsteadiness on feet: Secondary | ICD-10-CM

## 2018-07-03 DIAGNOSIS — M6281 Muscle weakness (generalized): Secondary | ICD-10-CM | POA: Diagnosis present

## 2018-07-03 DIAGNOSIS — R293 Abnormal posture: Secondary | ICD-10-CM

## 2018-07-03 DIAGNOSIS — Z9181 History of falling: Secondary | ICD-10-CM

## 2018-07-03 NOTE — Telephone Encounter (Signed)
Ok discontinue trental

## 2018-07-03 NOTE — Telephone Encounter (Signed)
Please advise. OK for refill? 

## 2018-07-03 NOTE — Therapy (Signed)
[facility] [address] [place], [place], [address] Phone: [phone]   Fax:  [phone]  Physical Therapy Treatment  Patient Details  Name: Thomas [name] MRN: [id number] Date of Birth: 09-18-1966 Referring Provider: Meridee Score, MD   Encounter Date: 07/03/2018  PT End of Session - 07/03/18 1325    Visit Number  8    Number of Visits  26    Date for PT Re-Evaluation  08/14/18    Authorization Type  Medicare & Generic commercial    PT Start Time  1146    PT Stop Time  1230    PT Time Calculation (min)  44 min    Equipment Utilized During Treatment  Gait belt    Activity Tolerance  Patient tolerated treatment well;Patient limited by fatigue    Behavior During Therapy  [facility] for tasks assessed/performed       Past Medical History:  Diagnosis Date  . Anemia   . Atrial fibrillation ([place])   . Chest pain with moderate risk for cardiac etiology 06/15/2018  . Chronic combined systolic and diastolic CHF (congestive heart failure) ([place])   . Diabetic foot ulcer ([place]) 09/03/2017  . Diabetic wet gangrene of the foot ([place]) 09/04/2017  . ESRD (end stage renal disease) on dialysis [facility])    [place] [address] [place], [place] (06/15/2018)  . Headache    "monthly" (06/15/2018)  . HIV disease ([place])   . Hyperparathyroidism, secondary ([place])   . Hypertension   . Hypertensive heart disease with end stage renal disease on dialysis ([place]) 03/13/2017  . Pneumonia X 1  . Subacute osteomyelitis, right ankle and foot ([place])   . SVT (supraventricular tachycardia) ([place])    ? afib or atrial flutter s/p TEE/DCCV with subsequent ablation due to reoccurrence in [place]  . Type 2 diabetes mellitus ([place])     Past Surgical History:  Procedure Laterality Date  . AMPUTATION Left    foot  . AMPUTATION Right 12/06/2017   Procedure: AMPUTATION BELOW KNEE;  Surgeon: [name], MD;  Location: [place];  Service: Orthopedics;  Laterality: Right;  . APPLICATION OF  WOUND VAC Right 09/04/2017   Procedure: APPLICATION OF WOUND VAC;  Surgeon: [name], DPM;  Location: [place];  Service: Podiatry;  Laterality: Right;  . APPLICATION OF WOUND VAC  12/06/2017   Procedure: APPLICATION OF WOUND VAC;  Surgeon: [name], MD;  Location: [place];  Service: Orthopedics;;  . AV FISTULA PLACEMENT Right 10/19/2012  . AV FISTULA REPAIR Right ~ 02/2018   "had it cleaned out"  . BELOW KNEE LEG AMPUTATION Left ~ 2016  . COLONOSCOPY WITH PROPOFOL N/A 07/30/2017   Procedure: COLONOSCOPY WITH PROPOFOL;  Surgeon: [name], MD;  Location: [place];  Service: Endoscopy;  Laterality: N/A;  . ESOPHAGOGASTRODUODENOSCOPY (EGD) WITH PROPOFOL N/A 07/28/2017   Procedure: ESOPHAGOGASTRODUODENOSCOPY (EGD) WITH PROPOFOL;  Surgeon: [name], MD;  Location: [place];  Service: Endoscopy;  Laterality: N/A;  . ESOPHAGOGASTRODUODENOSCOPY (EGD) WITH PROPOFOL N/A 01/15/2018   Procedure: ESOPHAGOGASTRODUODENOSCOPY (EGD) WITH PROPOFOL;  Surgeon: [name], MD;  Location: [facility] ENDOSCOPY;  Service: Endoscopy;  Laterality: N/A;  . FLEXIBLE SIGMOIDOSCOPY N/A 07/27/2017   Procedure: FLEXIBLE SIGMOIDOSCOPY;  Surgeon: [name], MD;  Location: [facility] ENDOSCOPY;  Service: Endoscopy;  Laterality: N/A;  . GRAFT APPLICATION Right 30/06/6225   Procedure: SKIN GRAFT APPLICATION RIGHT FOOT;  Surgeon: [name], DPM;  Location: [place];  Service: Podiatry;  Laterality: Right;  .  I&D EXTREMITY Right 09/04/2017   Procedure: IRRIGATION AND DEBRIDEMENT EXTREMITY;  Surgeon: [name], DPM;  Location: [place];  Service: Podiatry;  Laterality: Right;  . I&D EXTREMITY Right 11/12/2017   Procedure: IRRIGATION AND DEBRIDEMENT ULCER RIGHT FOOT;  Surgeon: [name], DPM;  Location: [place];  Service: Podiatry;  Laterality: Right;  . LEFT HEART CATH AND CORONARY ANGIOGRAPHY N/A 06/18/2018   Procedure: LEFT HEART CATH AND CORONARY ANGIOGRAPHY;  Surgeon: [name], Peter M, MD;  Location: [facility];  Service: Cardiovascular;  Laterality: N/A;  . WOUND DEBRIDEMENT N/A 09/24/2017   Procedure: DEBRIDEMENT WOUND;  Surgeon: [name], DPM;  Location: [place];  Service: Podiatry;  Laterality: N/A;    Vitals:   07/03/18 1341  Pulse: 84  SpO2: 95%    Subjective Assessment - 07/03/18 1151    Subjective  Doing well today. Went to cardiologist and that his afib is under control.    Pertinent History  R TTA, ESRD, DM2, HIV, cardiomyopathy, CHF, Tachycardia, A-Fib, Mallory-Weiss Tear, obesity    Limitations  Standing;Walking;House hold activities    Patient Stated Goals  To use bilateral prostheses to dance with wife & swimming,     Currently in Pain?  No/denies    Multiple Pain Sites  No       Gait:  Stepping up and over aerobic step 10x using SPC (with quad) tip only on the ascent but one hand on the counter on the descent occasionally until confidence increased. Mod Assist due to patient's fear of falling which caused him to want to avoid placing his prosthesis in the best possible position to descend from the step. Pt. had one significant LOB in which he grabbed the SPTA's hand to regain his balance. He became SOB and had to rest prior to continuing with the session after he finished his reps over the step.  Pt. side-stepped down and back 3x in the parallel bars without holding on, min guard, VCs to keep his back straight/upright to help maintain his balance, pt had to rest after this set due to fatigue. Pt. marched forward and backward 3x down and back in the parallel bars, min assist on the backward stretches due to pt's apprehension of not being able to see where he was going, both hands on the bar for first lap, 1 hand on the bar for 2nd lap, and neither hand on the bars for last lap. Pt. stood on the rockerboard in the ant/post direction with hips and knees slightly bent to maintain balance, min guard because pt was able to regain his balance on his own after becoming  accustomed to the board, and was able to raise his hands above his head and hold them there while balancing.  Pt.'s SaO2 would drop to 88-92% with activity and increases with rest to >/= 95%. HR varied due to A-fib staying in functional ranges.      PT Short Term Goals - 06/22/18 2226      PT SHORT TERM GOAL #1   Title  Patient verbalizes proper limb care for sweat management. (All STGs Target Date: 06/17/2018)    Baseline  MET 06/22/2018    Time  1    Period  Months    Status  Achieved      PT SHORT TERM GOAL #2   Title  Patient verbalizes options for pool with & without prostheses to address his goal.     Baseline  MET 06/22/2018  Time  1    Period  Months    Status  Achieved      PT SHORT TERM GOAL #3   Title  Patient performs standing balance with cane support reaching 10" & to floor with supervision.     Baseline  Can reach all the way to the floor and out 12" with support on cane and supervision    Time  1    Period  Months    Status  Achieved      PT SHORT TERM GOAL #4   Title  Patient ambulates 200' with cane & prostheses with supervision.     Baseline  230' with cane and prostheses with supervision until pt fatigued and required min A to regain balance at end of walk, 89%, 93 bpm after two laps    Time  1    Period  Months    Status  Partially Met      PT SHORT TERM GOAL #5   Title  Patient negotiates stairs with 1 rails(to exit/enter friend's house), curbs & ramps with cane & prostheses with minA.     Baseline  stairs with min A with cane; ramp mod A, curb unable with cane as only support    Time  1    Period  Months    Status  Partially Met      PT SHORT TERM GOAL #6   Title  Pt will improve endurance to be able to complete 6 min walk test with 2 standing rest breaks and more upright trunk posture.    Baseline  Completed 4 minutes, 2-3 standing rest breaks    Time  1    Period  Months    Status  Partially Met        PT Long Term Goals - 05/20/18 1513       PT LONG TERM GOAL #1   Title  Patient verbalizes & demonstrates proper prosthetic care including prostheses with dialysis to enable safe use of bilateral Transtibial prostheses. (All LTGs Target Date: 08/14/2018)    Time  3    Period  Months    Status  New      PT LONG TERM GOAL #2   Title  Patient tolerates wear of bilateral Transtibial prostheses >90% of awake hours without skin issues or limb pain to enable function during most of his day.     Time  3    Period  Months    Status  New      PT LONG TERM GOAL #3   Title  Standing balance with cane or less support reaching 10" anteriorly, to floor, scanning & managing clothes modified independent.     Time  3    Period  Months    Status  New      PT LONG TERM GOAL #4   Title  Patient ambulates 500' outdoors including grass with cane or less & bilateral prostheses modified independent for community mobility.     Time  3    Period  Months    Status  New      PT LONG TERM GOAL #5   Title  Patient ambulates100'  around furniture carrying plate or cup with cane or less & prostheses modified independent for household mobility.     Time  3    Period  Months    Status  New      Additional Long Term Goals   Additional Long Term Goals  Yes      PT LONG TERM GOAL #6   Title  Patient negotiates ramps, curbs & stairs (1 rail) with cane or less & prostheses modified independent for community access.     Time  3    Period  Months    Status  New      PT LONG TERM GOAL #7   Title  Pt will improve endurance during gait by increasing distance on 6 minute walk test by 150' with LRAD    Baseline  451 in 4 minutes; will re-assess at 4 weeks    Time  3    Period  Months    Status  New    Target Date  08/14/18       Plan - 07/03/18 1327    Clinical Impression Statement  Pt. came to therapy with SPC (quad tip) only. Session focused on addressing curbs using cane only and balance activities without the cane in the parallel bars. Pt. would  benefit from continued PT to address curbs, stairs, or ramps, and with improving his balance reactions.    Rehab Potential  Good    PT Frequency  2x / week 13 weeks (90 days)    PT Duration  Other (comment)    PT Treatment/Interventions  ADLs/Self Care Home Management;Canalith Repostioning;DME Instruction;Gait training;Stair training;Functional mobility training;Therapeutic activities;Therapeutic exercise;Balance training;Patient/family education;Prosthetic Training;Vestibular    PT Next Visit Plan  Monitor HR & SpO2 for activities.  curb negotiation training with just UE support on cane - needs to work on proximal hip strengthening. Balance reactions. Gait over variety of surfaces with cane.  Endurance and standing balance with decreased UE support    Consulted and Agree with Plan of Care  Patient       Patient will benefit from skilled therapeutic intervention in order to improve the following deficits and impairments:  Abnormal gait, Cardiopulmonary status limiting activity, Decreased activity tolerance, Decreased balance, Decreased endurance, Decreased mobility, Decreased knowledge of use of DME, Decreased range of motion, Decreased scar mobility, Decreased strength, Postural dysfunction, Prosthetic Dependency  Visit Diagnosis: Unsteadiness on feet  Other abnormalities of gait and mobility  Muscle weakness (generalized)  Abnormal posture  History of falling     Problem List Patient Active Problem List   Diagnosis Date Noted  . Non-ST elevation (NSTEMI) myocardial infarction ([place])   . Persistent atrial fibrillation with rapid ventricular response ([place]) 06/15/2018  . Chest pain with moderate risk for cardiac etiology 06/15/2018  . Atrial flutter ([place]) 06/15/2018  . Somniloquy 06/12/2018  . Encounter for therapeutic drug monitoring 05/27/2018  . Chronic systolic heart failure ([place])   . Demand ischemia ([place])   . Atrial fibrillation with RVR ([place]) 05/02/2018  . Murmur, cardiac  02/13/2018  . Mallory-Weiss tear 01/16/2018  . Morbid obesity ([place])   . Fall   . Subtherapeutic international normalized ratio (INR)   . History of left below knee amputation ([place]) 12/11/2017  . History of supraventricular tachycardia   . S/P bilateral BKA (below knee amputation) ([place])   . Anemia of chronic disease   . Leukocytosis   . Non-ischemic cardiomyopathy ([place])   . Hypokalemia 09/04/2017  . Essential hypertension 09/03/2017  . GERD (gastroesophageal reflux disease) 09/03/2017  . GIB (gastrointestinal bleeding) 07/26/2017  . Anemia due to end stage renal disease ([place]) 07/26/2017  . PAF (paroxysmal atrial fibrillation) ([place]) 07/26/2017  . Symptomatic anemia 07/26/2017  . End stage renal disease ([place]) 05/05/2017  . HIV disease ([place]) 03/25/2017  .  Hypertensive heart disease with end stage renal disease on dialysis ([place]) 03/13/2017  . ESRD on dialysis ([place]) 03/13/2017  . Type 2 diabetes mellitus with complication ([place]) 35/33/1740  . SVT (supraventricular tachycardia) ([place])     Arthor Captain, SPTA 07/03/2018, 2:00 PM  [facility] [address] [place], [place], [address] Phone: [phone]   Fax:  [phone]  Name: Thomas [name] MRN: [id number] Date of Birth: November 15, 1966

## 2018-07-04 ENCOUNTER — Other Ambulatory Visit: Payer: Self-pay | Admitting: Internal Medicine

## 2018-07-06 ENCOUNTER — Ambulatory Visit: Payer: Medicare Other | Admitting: Physical Therapy

## 2018-07-08 ENCOUNTER — Ambulatory Visit (INDEPENDENT_AMBULATORY_CARE_PROVIDER_SITE_OTHER): Payer: Medicare Other | Admitting: *Deleted

## 2018-07-08 ENCOUNTER — Ambulatory Visit: Payer: Medicare Other | Admitting: Physical Therapy

## 2018-07-08 DIAGNOSIS — I4891 Unspecified atrial fibrillation: Secondary | ICD-10-CM

## 2018-07-08 DIAGNOSIS — Z5181 Encounter for therapeutic drug level monitoring: Secondary | ICD-10-CM

## 2018-07-08 LAB — POCT INR: INR: 2 (ref 2.0–3.0)

## 2018-07-08 NOTE — Patient Instructions (Signed)
Description   Continue taking 1 tablet daily except 1/2 tablet on Sundays, Tuesdays and Fridays. Recheck INR in 2 weeks. [facility] [phone] Main [phone]

## 2018-07-09 ENCOUNTER — Ambulatory Visit: Payer: Medicare Other | Admitting: Cardiology

## 2018-07-13 ENCOUNTER — Ambulatory Visit: Payer: Medicare Other | Admitting: Physical Therapy

## 2018-07-15 ENCOUNTER — Ambulatory Visit: Payer: Medicare Other | Admitting: Physical Therapy

## 2018-07-15 ENCOUNTER — Encounter: Payer: Self-pay | Admitting: Physical Therapy

## 2018-07-15 DIAGNOSIS — R293 Abnormal posture: Secondary | ICD-10-CM

## 2018-07-15 DIAGNOSIS — M6281 Muscle weakness (generalized): Secondary | ICD-10-CM

## 2018-07-15 DIAGNOSIS — R2689 Other abnormalities of gait and mobility: Secondary | ICD-10-CM

## 2018-07-15 DIAGNOSIS — R2681 Unsteadiness on feet: Secondary | ICD-10-CM

## 2018-07-15 NOTE — Patient Instructions (Signed)
Near counter or rail for safety 1. Side step to right & left 2. Walk backwards, use cane in hand opposite counter 3. Look left/right & up/down 4. Face counter, turn to walk to right & left. Step leg towards direction going & weight on it to walk that direction.  5. Place chair facing counter. Work on sitting & standing without touching counter unless need it for balance.

## 2018-07-16 ENCOUNTER — Other Ambulatory Visit: Payer: Self-pay | Admitting: Cardiology

## 2018-07-16 NOTE — Therapy (Signed)
[facility] [address] [place], [place], [address] Phone: [phone]   Fax:  [phone]  Physical Therapy Treatment  Patient Details  Name: Thomas [name] MRN: [id number] Date of Birth: May 12, 1966 Referring Provider: Meridee Score, MD   Encounter Date: 07/15/2018  PT End of Session - 07/15/18 1312    Visit Number  9    Number of Visits  26    Date for PT Re-Evaluation  08/14/18    Authorization Type  Medicare & Generic commercial    PT Start Time  1230    PT Stop Time  1312    PT Time Calculation (min)  42 min    Equipment Utilized During Treatment  Gait belt    Activity Tolerance  Patient tolerated treatment well;Patient limited by fatigue    Behavior During Therapy  [facility] for tasks assessed/performed       Past Medical History:  Diagnosis Date  . Anemia   . Atrial fibrillation ([place])   . Chest pain with moderate risk for cardiac etiology 06/15/2018  . Chronic combined systolic and diastolic CHF (congestive heart failure) ([place])   . Diabetic foot ulcer ([place]) 09/03/2017  . Diabetic wet gangrene of the foot ([place]) 09/04/2017  . ESRD (end stage renal disease) on dialysis [facility])    [place] [address] [place], [place] (06/15/2018)  . Headache    "monthly" (06/15/2018)  . HIV disease ([place])   . Hyperparathyroidism, secondary ([place])   . Hypertension   . Hypertensive heart disease with end stage renal disease on dialysis ([place]) 03/13/2017  . Pneumonia X 1  . Subacute osteomyelitis, right ankle and foot ([place])   . SVT (supraventricular tachycardia) ([place])    ? afib or atrial flutter s/p TEE/DCCV with subsequent ablation due to reoccurrence in [place]  . Type 2 diabetes mellitus ([place])     Past Surgical History:  Procedure Laterality Date  . AMPUTATION Left    foot  . AMPUTATION Right 12/06/2017   Procedure: AMPUTATION BELOW KNEE;  Surgeon: [name], MD;  Location: [facility];  Service: Orthopedics;  Laterality: Right;  . APPLICATION OF  WOUND VAC Right 09/04/2017   Procedure: APPLICATION OF WOUND VAC;  Surgeon: [name], DPM;  Location: [facility];  Service: Podiatry;  Laterality: Right;  . APPLICATION OF WOUND VAC  12/06/2017   Procedure: APPLICATION OF WOUND VAC;  Surgeon: [name], MD;  Location: [facility];  Service: Orthopedics;;  . AV FISTULA PLACEMENT Right 10/19/2012  . AV FISTULA REPAIR Right ~ 02/2018   "had it cleaned out"  . BELOW KNEE LEG AMPUTATION Left ~ 2016  . COLONOSCOPY WITH PROPOFOL N/A 07/30/2017   Procedure: COLONOSCOPY WITH PROPOFOL;  Surgeon: [name], MD;  Location: [facility];  Service: Endoscopy;  Laterality: N/A;  . ESOPHAGOGASTRODUODENOSCOPY (EGD) WITH PROPOFOL N/A 07/28/2017   Procedure: ESOPHAGOGASTRODUODENOSCOPY (EGD) WITH PROPOFOL;  Surgeon: [name], MD;  Location: [facility];  Service: Endoscopy;  Laterality: N/A;  . ESOPHAGOGASTRODUODENOSCOPY (EGD) WITH PROPOFOL N/A 01/15/2018   Procedure: ESOPHAGOGASTRODUODENOSCOPY (EGD) WITH PROPOFOL;  Surgeon: [name], MD;  Location: [facility] ENDOSCOPY;  Service: Endoscopy;  Laterality: N/A;  . FLEXIBLE SIGMOIDOSCOPY N/A 07/27/2017   Procedure: FLEXIBLE SIGMOIDOSCOPY;  Surgeon: [name], MD;  [facility] END[facility]COPY;  Service: Endoscopy;  Laterality: N/A;  . GRAFT APPLICATION Right 80/16/5537   Procedure: SKIN GRAFT APPLICATION RIGHT FOOT;  Surgeon: [name], DPM;  Location: [facility];  Service: Podiatry;  Laterality: Right;  .  I&D EXTREMITY Right 09/04/2017   Procedure: IRRIGATION AND DEBRIDEMENT EXTREMITY;  Surgeon: [name], DPM;  Location: [facility];  Service: Podiatry;  Laterality: Right;  . I&D EXTREMITY Right 11/12/2017   Procedure: IRRIGATION AND DEBRIDEMENT ULCER RIGHT FOOT;  Surgeon: [name], DPM;  Location: [facility];  Service: Podiatry;  Laterality: Right;  . LEFT HEART CATH AND CORONARY ANGIOGRAPHY N/A 06/18/2018   Procedure: LEFT HEART CATH AND CORONARY ANGIOGRAPHY;  Surgeon: [name], Peter M, MD;  Location: [facility];  Service: Cardiovascular;  Laterality: N/A;  . WOUND DEBRIDEMENT N/A 09/24/2017   Procedure: DEBRIDEMENT WOUND;  Surgeon: [name], DPM;  Location: [facility];  Service: Podiatry;  Laterality: N/A;    There were no vitals filed for this visit.  Subjective Assessment - 07/15/18 1227    Subjective  He is wearing prostheses all awake hours. He has hair follicle on outside of right leg with small irritation.     Pertinent History  R TTA, ESRD, DM2, HIV, cardiomyopathy, CHF, Tachycardia, A-Fib, Mallory-Weiss Tear, obesity    Limitations  Standing;Walking;House hold activities    Patient Stated Goals  To use bilateral prostheses to dance with wife & swimming,     Currently in Pain?  No/denies                       [facility] Adult PT Treatment/Exercise - 07/15/18 1315      Transfers   Transfers  Sit to Stand;Stand to Sit    Sit to Stand  5: Supervision;With upper extremity assist;From chair/3-in-1    Sit to Stand Details (indicate cue type and reason)  Verbal & tactile cues on chairs without armrests using UEs to assist and goal to not touch external support for staibilization    Stand to Sit  5: Supervision;With upper extremity assist;To chair/3-in-1    Stand to Sit Details  Verbal & tactile cues on chairs without armrests using UEs to assist and goal to not touch external support for staibilization      Ambulation/Gait   Ambulation/Gait  Yes    Ambulation/Gait Assistance  4: Min guard;5: Supervision    Ambulation/Gait Assistance Details  tactile & verbal cues on balance reactions with scanning, direction changes and negotiating obstacles    Ambulation Distance (Feet)  200 Feet 200' X 2    Assistive device  Prostheses;None;Straight cane arrived with cane, training without device    Gait Pattern  Step-through pattern;Trunk flexed    Ambulation Surface  Indoor;Level    Stairs  Yes    Stairs Assistance  5: Supervision    Stair Management Technique  Two  rails;Step to pattern;Forwards;With cane alternating lead limb both ascending & descending    Number of Stairs  4 3 reps    Ramp  4: Min assist Bil TTA prostheses only, no AD    Ramp Details (indicate cue type and reason)  verbal cues on posture & wt shift    Curb  4: Min assist Bil TTA prostheses only, no AD    Curb Details (indicate cue type and reason)  verbal & manual cues on technique including foot position, using momentum and posture    Gait Comments  HR 81-90 bpm with gait and SpO2 91-92% except stepping over barriers SpO2 89% with recovery >/= 94% within 1 minute      High Level Balance   High Level Balance Activities  Side stepping;Backward walking;Head turns;Direction changes    High Level  Balance Comments  verbal cues on technique; also added as HEP near counter      Prosthetics   Prosthetic Care Comments   --    Current prosthetic wear tolerance (days/week)   daily    Current prosthetic wear tolerance (#hours/day)   most awake hours for bil prostheses, removing to dry as needed with rest about half way through day    Residual limb condition   RLE has lateral small (84m) open area with no signs of infection, normal color, temperature    Education Provided  Skin check;Residual limb care;Proper wear schedule/adjustment;Prosthetic cleaning    Person(s) Educated  Patient    Education Method  Explanation;Verbal cues    Education Method  Verbalized understanding               PT Short Term Goals - 06/22/18 2226      PT SHORT TERM GOAL #1   Title  Patient verbalizes proper limb care for sweat management. (All STGs Target Date: 06/17/2018)    Baseline  MET 06/22/2018    Time  1    Period  Months    Status  Achieved      PT SHORT TERM GOAL #2   Title  Patient verbalizes options for pool with & without prostheses to address his goal.     Baseline  MET 06/22/2018    Time  1    Period  Months    Status  Achieved      PT SHORT TERM GOAL #3   Title  Patient performs  standing balance with cane support reaching 10" & to floor with supervision.     Baseline  Can reach all the way to the floor and out 12" with support on cane and supervision    Time  1    Period  Months    Status  Achieved      PT SHORT TERM GOAL #4   Title  Patient ambulates 200' with cane & prostheses with supervision.     Baseline  230' with cane and prostheses with supervision until pt fatigued and required min A to regain balance at end of walk, 89%, 93 bpm after two laps    Time  1    Period  Months    Status  Partially Met      PT SHORT TERM GOAL #5   Title  Patient negotiates stairs with 1 rails(to exit/enter friend's house), curbs & ramps with cane & prostheses with minA.     Baseline  stairs with min A with cane; ramp mod A, curb unable with cane as only support    Time  1    Period  Months    Status  Partially Met      PT SHORT TERM GOAL #6   Title  Pt will improve endurance to be able to complete 6 min walk test with 2 standing rest breaks and more upright trunk posture.    Baseline  Completed 4 minutes, 2-3 standing rest breaks    Time  1    Period  Months    Status  Partially Met        PT Long Term Goals - 05/20/18 1513      PT LONG TERM GOAL #1   Title  Patient verbalizes & demonstrates proper prosthetic care including prostheses with dialysis to enable safe use of bilateral Transtibial prostheses. (All LTGs Target Date: 08/14/2018)    Time  3    Period  Months    Status  New      PT LONG TERM GOAL #2   Title  Patient tolerates wear of bilateral Transtibial prostheses >90% of awake hours without skin issues or limb pain to enable function during most of his day.     Time  3    Period  Months    Status  New      PT LONG TERM GOAL #3   Title  Standing balance with cane or less support reaching 10" anteriorly, to floor, scanning & managing clothes modified independent.     Time  3    Period  Months    Status  New      PT LONG TERM GOAL #4   Title   Patient ambulates 500' outdoors including grass with cane or less & bilateral prostheses modified independent for community mobility.     Time  3    Period  Months    Status  New      PT LONG TERM GOAL #5   Title  Patient ambulates100'  around furniture carrying plate or cup with cane or less & prostheses modified independent for household mobility.     Time  3    Period  Months    Status  New      Additional Long Term Goals   Additional Long Term Goals  Yes      PT LONG TERM GOAL #6   Title  Patient negotiates ramps, curbs & stairs (1 rail) with cane or less & prostheses modified independent for community access.     Time  3    Period  Months    Status  New      PT LONG TERM GOAL #7   Title  Pt will improve endurance during gait by increasing distance on 6 minute walk test by 150' with LRAD    Baseline  451 in 4 minutes; will re-assess at 4 weeks    Time  3    Period  Months    Status  New    Target Date  08/14/18            Plan - 07/15/18 1959    Clinical Impression Statement  Today's skilled session focused on gait & balance without AD except bilateral TTA prostheses. He improved ability to move different directions (sideways & backwards) and change directions with PT instructions & practice. He needs intermittent UE support so PT recommended performing near counter or rail at home. Pt verbalized understanding.     Rehab Potential  Good    PT Frequency  2x / week 13 weeks (90 days)    PT Duration  Other (comment)    PT Treatment/Interventions  ADLs/Self Care Home Management;Canalith Repostioning;DME Instruction;Gait training;Stair training;Functional mobility training;Therapeutic activities;Therapeutic exercise;Balance training;Patient/family education;Prosthetic Training;Vestibular    PT Next Visit Plan  Monitor HR & SpO2 for activities.  prosthetic gait & balance with & without cane    Consulted and Agree with Plan of Care  Patient       Patient will benefit from  skilled therapeutic intervention in order to improve the following deficits and impairments:  Abnormal gait, Cardiopulmonary status limiting activity, Decreased activity tolerance, Decreased balance, Decreased endurance, Decreased mobility, Decreased knowledge of use of DME, Decreased range of motion, Decreased scar mobility, Decreased strength, Postural dysfunction, Prosthetic Dependency  Visit Diagnosis: Unsteadiness on feet  Other abnormalities of gait and mobility  Muscle weakness (generalized)  Abnormal posture  Problem List Patient Active Problem List   Diagnosis Date Noted  . Non-ST elevation (NSTEMI) myocardial infarction ([place])   . Persistent atrial fibrillation with rapid ventricular response ([place]) 06/15/2018  . Chest pain with moderate risk for cardiac etiology 06/15/2018  . Atrial flutter ([place]) 06/15/2018  . Somniloquy 06/12/2018  . Encounter for therapeutic drug monitoring 05/27/2018  . Chronic systolic heart failure ([place])   . Demand ischemia ([place])   . Atrial fibrillation with RVR ([place]) 05/02/2018  . Murmur, cardiac 02/13/2018  . Mallory-Weiss tear 01/16/2018  . Morbid obesity ([place])   . Fall   . Subtherapeutic international normalized ratio (INR)   . History of left below knee amputation ([place]) 12/11/2017  . History of supraventricular tachycardia   . S/P bilateral BKA (below knee amputation) ([place])   . Anemia of chronic disease   . Leukocytosis   . Non-ischemic cardiomyopathy ([place])   . Hypokalemia 09/04/2017  . Essential hypertension 09/03/2017  . GERD (gastroesophageal reflux disease) 09/03/2017  . GIB (gastrointestinal bleeding) 07/26/2017  . Anemia due to end stage renal disease ([place]) 07/26/2017  . PAF (paroxysmal atrial fibrillation) ([place]) 07/26/2017  . Symptomatic anemia 07/26/2017  . End stage renal disease ([place]) 05/05/2017  . HIV disease ([place]) 03/25/2017  . Hypertensive heart disease with end stage renal disease on dialysis ([place]) 03/13/2017  . ESRD  on dialysis ([place]) 03/13/2017  . Type 2 diabetes mellitus with complication ([place]) 16/96/7893  . SVT (supraventricular tachycardia) ([facility])     Crit Obremski PT, DPT 07/16/2018, 10:02 AM  [facility] [address] [place], [place], [address] Phone: [phone]   Fax:  [phone]  Name: [place] Risdon MRN: [id number] Date of Birth: [birth year]

## 2018-07-20 ENCOUNTER — Encounter: Payer: Self-pay | Admitting: Physical Therapy

## 2018-07-20 ENCOUNTER — Ambulatory Visit: Payer: Medicare Other | Admitting: Physical Therapy

## 2018-07-20 DIAGNOSIS — M6281 Muscle weakness (generalized): Secondary | ICD-10-CM

## 2018-07-20 DIAGNOSIS — R293 Abnormal posture: Secondary | ICD-10-CM

## 2018-07-20 DIAGNOSIS — R2681 Unsteadiness on feet: Secondary | ICD-10-CM

## 2018-07-20 DIAGNOSIS — R2689 Other abnormalities of gait and mobility: Secondary | ICD-10-CM

## 2018-07-21 IMAGING — DX DG CHEST 1V PORT
2 series · 2 of 2 positions shown · non-contrast
Comparison: 06/04/2018 and 05/02/2018 and 12/05/2017

CLINICAL DATA: Chest pain.  Atrial fibrillation.

EXAM:
PORTABLE CHEST 1 VIEW

[chest ap (1 of 2)]
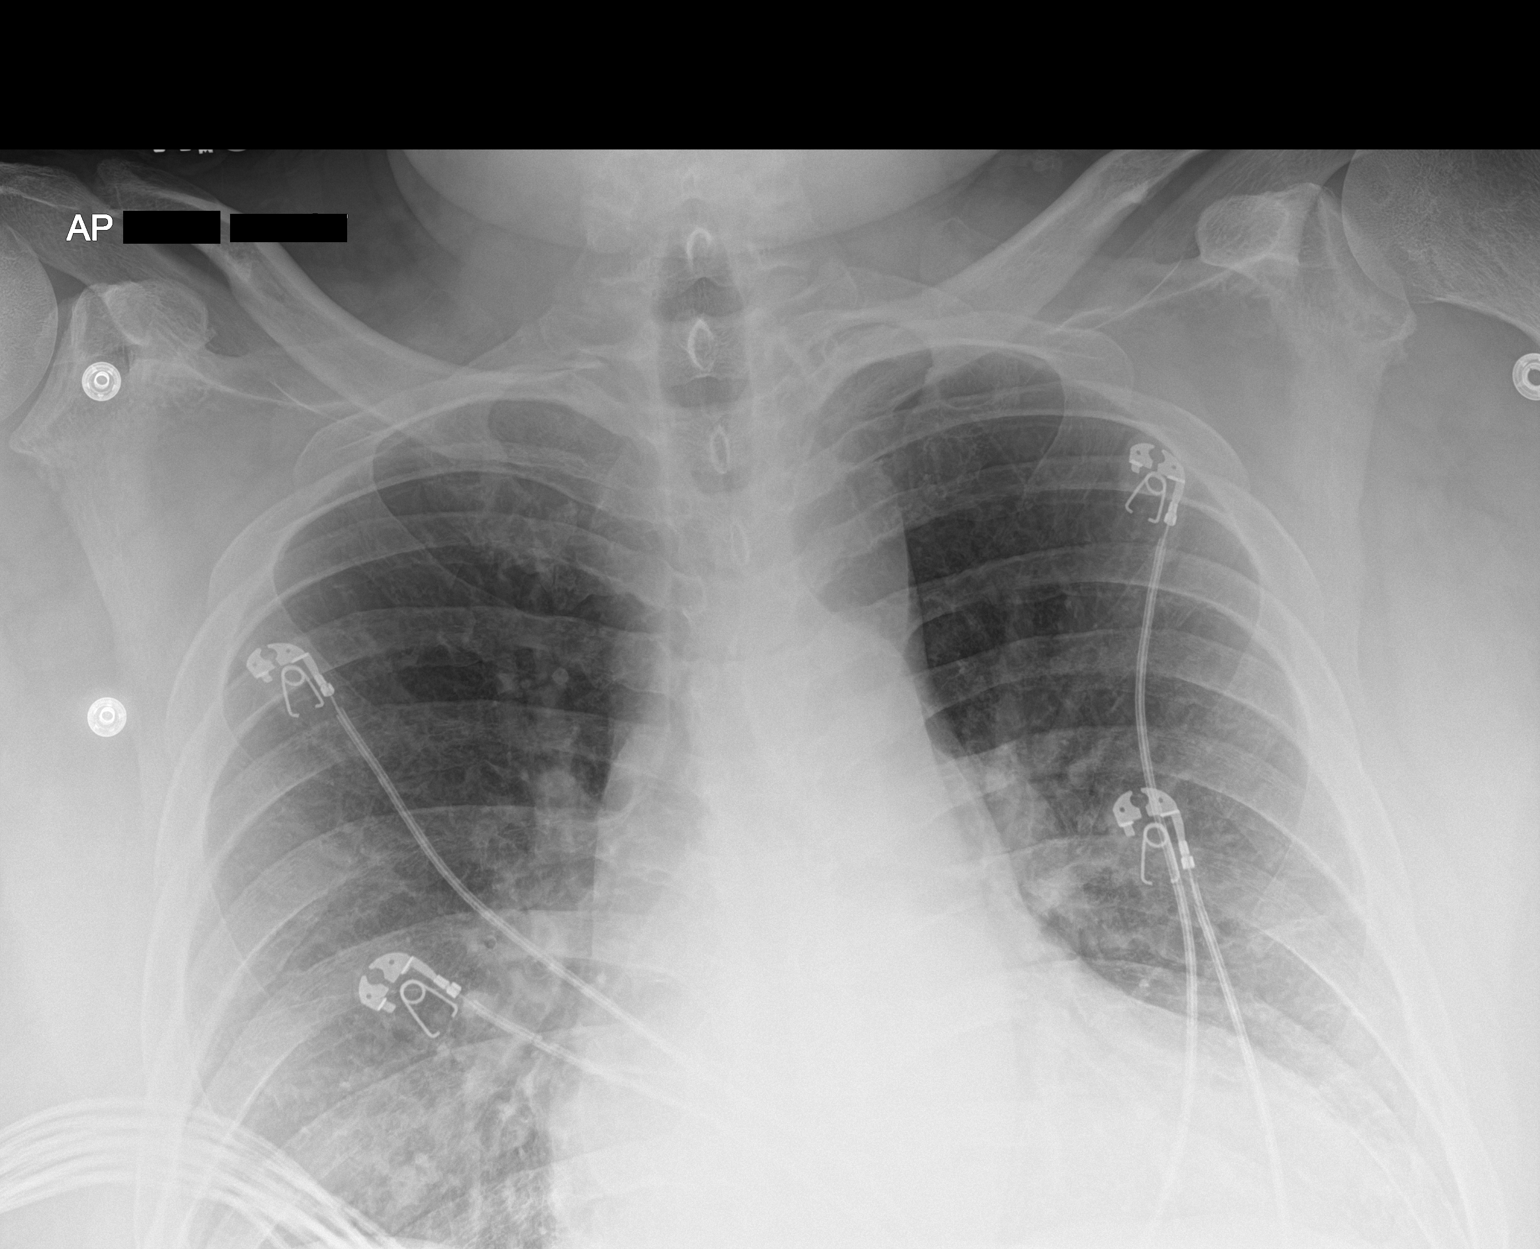

[chest ap (2 of 2)]
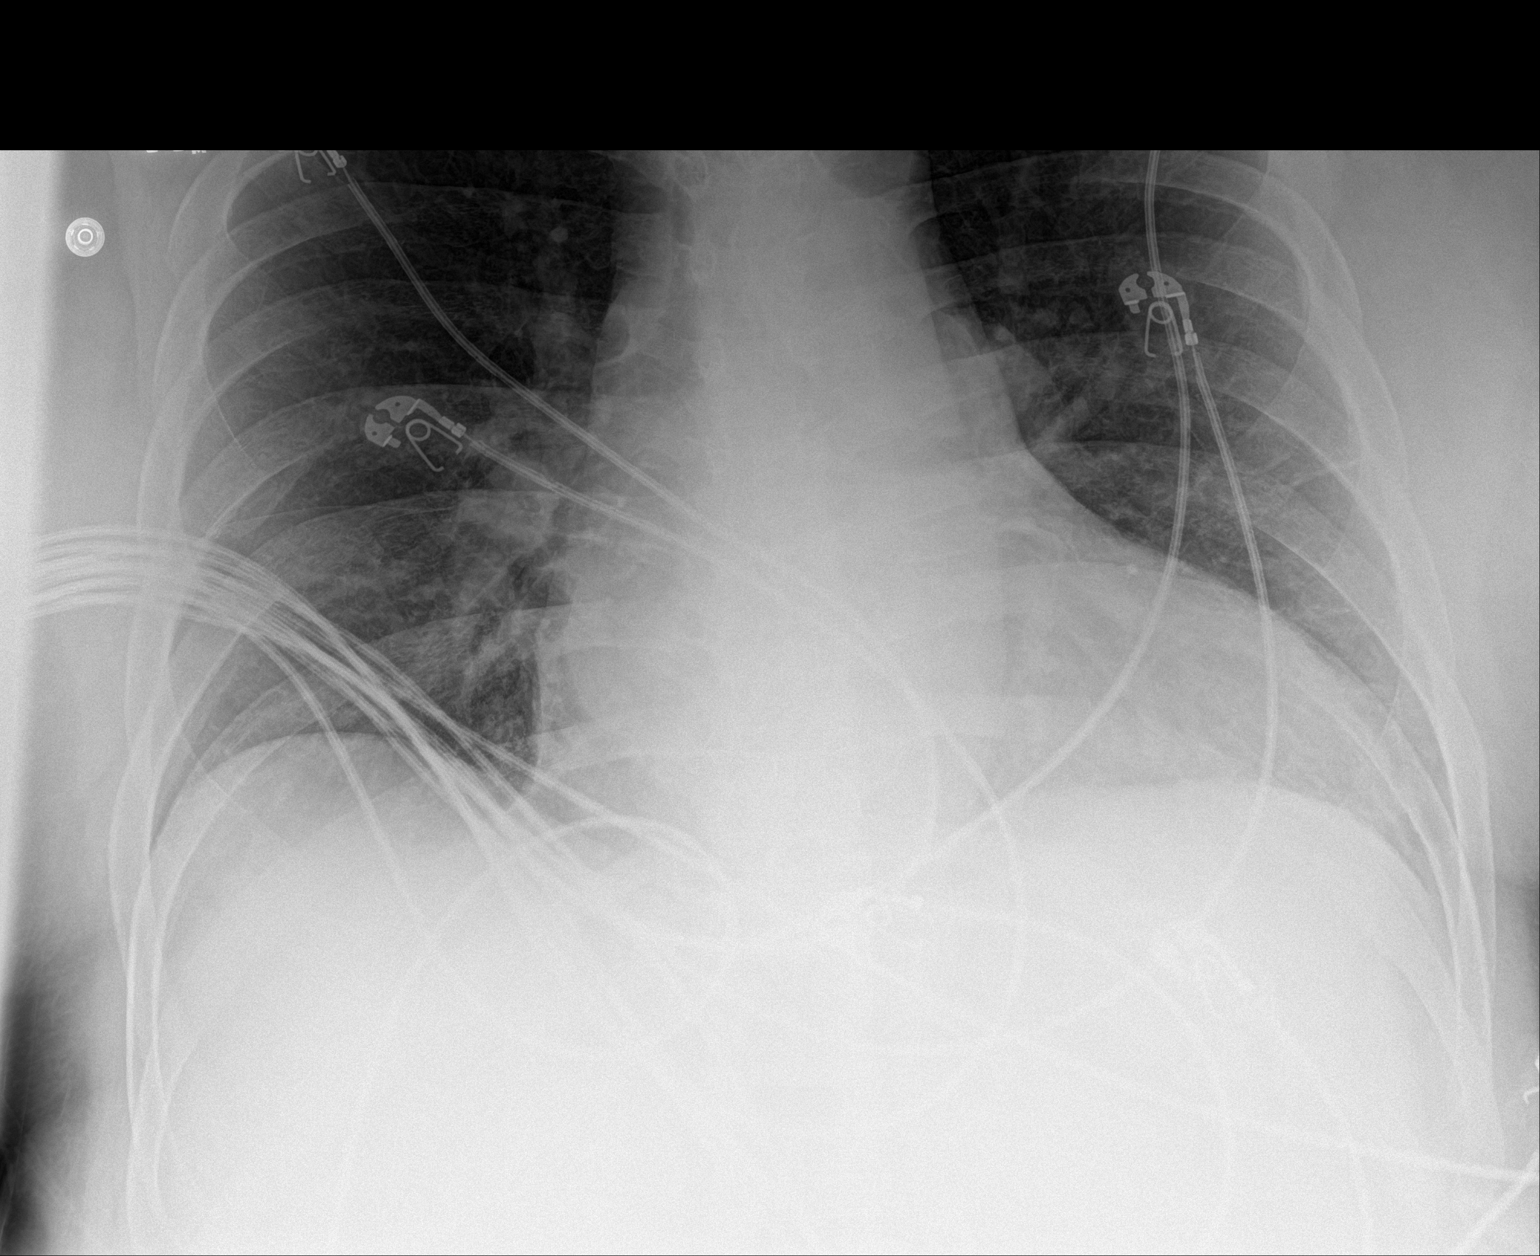

[2 of 2 positions shown; findings below may reference images not displayed]

FINDINGS: Heart size and pulmonary vascularity are normal. Lungs are clear
except for a tiny area of linear scarring in the left midzone. No
bone abnormality.
IMPRESSION: No active disease.

## 2018-07-21 NOTE — Therapy (Signed)
[facility] [address] [place], [place], [address] Phone: [phone]   Fax:  [phone]  Physical Therapy Treatment  Patient Details  Name: Thomas [name] MRN: [id number] Date of Birth: Apr 18, 1966 Referring Provider: Meridee Score, MD   Encounter Date: 07/20/2018  PT End of Session - 07/20/18 1314    Visit Number  10 10 visits 05/18/18 - 07/20/18    Number of Visits  26    Date for PT Re-Evaluation  08/14/18    Authorization Type  Medicare & Generic commercial    PT Start Time  1230    PT Stop Time  1313    PT Time Calculation (min)  43 min    Equipment Utilized During Treatment  Gait belt    Activity Tolerance  Patient tolerated treatment well;Patient limited by fatigue    Behavior During Therapy  WFL for tasks assessed/performed       Past Medical History:  Diagnosis Date  . Anemia   . Atrial fibrillation ([place])   . Chest pain with moderate risk for cardiac etiology 06/15/2018  . Chronic combined systolic and diastolic CHF (congestive heart failure) ([place])   . Diabetic foot ulcer ([place]) 09/03/2017  . Diabetic wet gangrene of the foot ([place]) 09/04/2017  . ESRD (end stage renal disease) on dialysis [facility])    [place] [address] [place], [place], [place] (06/15/2018)  . Headache    "monthly" (06/15/2018)  . HIV disease ([place])   . Hyperparathyroidism, secondary ([place])   . Hypertension   . Hypertensive heart disease with end stage renal disease on dialysis ([place]) 03/13/2017  . Pneumonia X 1  . Subacute osteomyelitis, right ankle and foot ([place])   . SVT (supraventricular tachycardia) ([place])    ? afib or atrial flutter s/p TEE/DCCV with subsequent ablation due to reoccurrence in [place]  . Type 2 diabetes mellitus ([place])     Past Surgical History:  Procedure Laterality Date  . AMPUTATION Left    foot  . AMPUTATION Right 12/06/2017   Procedure: AMPUTATION BELOW KNEE;  Surgeon: [name], MD;  Location: [facility];  Service: Orthopedics;   Laterality: Right;  . APPLICATION OF WOUND VAC Right 09/04/2017   Procedure: APPLICATION OF WOUND VAC;  Surgeon: [name], DPM;  Location: [facility];  Service: Podiatry;  Laterality: Right;  . APPLICATION OF WOUND VAC  12/06/2017   Procedure: APPLICATION OF WOUND VAC;  Surgeon: [name], MD;  Location: [facility];  Service: Orthopedics;;  . AV FISTULA PLACEMENT Right 10/19/2012  . AV FISTULA REPAIR Right ~ 02/2018   "had it cleaned out"  . BELOW KNEE LEG AMPUTATION Left ~ 2016  . COLONOSCOPY WITH PROPOFOL N/A 07/30/2017   Procedure: COLONOSCOPY WITH PROPOFOL;  Surgeon: [name], MD;  Location: [place];  Service: Endoscopy;  Laterality: N/A;  . ESOPHAGOGASTRODUODENOSCOPY (EGD) WITH PROPOFOL N/A 07/28/2017   Procedure: ESOPHAGOGASTRODUODENOSCOPY (EGD) WITH PROPOFOL;  Surgeon: [name], MD;  Location: [place];  Service: Endoscopy;  Laterality: N/A;  . ESOPHAGOGASTRODUODENOSCOPY (EGD) WITH PROPOFOL N/A 01/15/2018   Procedure: ESOPHAGOGASTRODUODENOSCOPY (EGD) WITH PROPOFOL;  Surgeon: [name], MD;  Location: [facility] ENDOSCOPY;  Service: Endoscopy;  Laterality: N/A;  . FLEXIBLE SIGMOIDOSCOPY N/A 07/27/2017   Procedure: FLEXIBLE SIGMOIDOSCOPY;  Surgeon: [name], MD;  Location: [facility] ENDOSCOPY;  Service: Endoscopy;  Laterality: N/A;  . GRAFT APPLICATION Right 64/33/2951   Procedure: SKIN GRAFT APPLICATION RIGHT FOOT;  Surgeon: [name], DPM;  Location: [place];  Service:  Podiatry;  Laterality: Right;  . I&D EXTREMITY Right 09/04/2017   Procedure: IRRIGATION AND DEBRIDEMENT EXTREMITY;  Surgeon: [name], DPM;  Location: [facility];  Service: Podiatry;  Laterality: Right;  . I&D EXTREMITY Right 11/12/2017   Procedure: IRRIGATION AND DEBRIDEMENT ULCER RIGHT FOOT;  Surgeon: [name], DPM;  Location: [place];  Service: Podiatry;  Laterality: Right;  . LEFT HEART CATH AND CORONARY ANGIOGRAPHY N/A 06/18/2018   Procedure: LEFT HEART CATH AND CORONARY ANGIOGRAPHY;   Surgeon: [name], Peter M, MD;  Location: [facility];  Service: Cardiovascular;  Laterality: N/A;  . WOUND DEBRIDEMENT N/A 09/24/2017   Procedure: DEBRIDEMENT WOUND;  Surgeon: [name], DPM;  Location: [facility];  Service: Podiatry;  Laterality: N/A;    There were no vitals filed for this visit.  Subjective Assessment - 07/20/18 1230    Subjective  He went to local water park with family. He was able to do lazy river & water play area.     Pertinent History  R TTA, ESRD, DM2, HIV, cardiomyopathy, CHF, Tachycardia, A-Fib, Mallory-Weiss Tear, obesity    Limitations  Standing;Walking;House hold activities    Patient Stated Goals  To use bilateral prostheses to dance with wife & swimming,     Currently in Pain?  No/denies                       [facility] Adult PT Treatment/Exercise - 07/20/18 1230      Transfers   Transfers  Sit to Stand;Stand to Sit;Floor to Transfer    Sit to Stand  5: Supervision;With upper extremity assist;From chair/3-in-1    Sit to Stand Details (indicate cue type and reason)  working on uses LEs >UEs and stabilizing without external support    Stand to Sit  5: Supervision;With upper extremity assist;To chair/3-in-1    Stand to Sit Details  working on uses LEs >UEs and stabilizing without external support    Floor to Transfer  5: Supervision;With upper extremity assist pushing on mat table    Floor to Transfer Details (indicate cue type and reason)  PT demo, instructed in technique via half kneeling leading with either LE. Pt return demo understanding with verbal cues.      Floor to Transfer Details  Visual cues/gestures for sequencing;Verbal cues for sequencing;Verbal cues for technique      Ambulation/Gait   Ambulation/Gait  Yes    Ambulation/Gait Assistance  4: Min guard;5: Supervision;4: Min assist Min guard/MinA without device & cane gr, supervision cane in    Ambulation/Gait Assistance Details  verbal & tactile cues on posture, wt shift &  step length    Ambulation Distance (Feet)  200 Feet 200' indoor cane, 50' X 2 without device, 400' outdoor cane    Assistive device  Prostheses;None;Straight cane arrived with cane, training without device    Gait Pattern  Step-through pattern;Trunk flexed    Ambulation Surface  Indoor;Level;Outdoor;Paved;Gravel;Grass 75' grass with cane with minA    Stairs  Yes    Stairs Assistance  5: Supervision    Stair Management Technique  Two rails;Alternating pattern;Forwards    Number of Stairs  4 3 reps    Ramp  5: Supervision Bil TTA prostheses only, cane    Ramp Details (indicate cue type and reason)  verbal cues on posture    Curb  4: Min assist Bil TTA prostheses only, cane outdoors    Curb Details (indicate cue type and reason)  verbal cues  on technique including clearance of prosthesis    Gait Comments  HR 81-90 bpm with gait and SpO2 91-92% except stepping over barriers SpO2 89% with recovery >/= 94% within 1 minute      High Level Balance   High Level Balance Activities  Side stepping;Backward walking;Head turns;Direction changes    High Level Balance Comments  verbal cues on technique; also added as HEP near counter      Prosthetics   Current prosthetic wear tolerance (days/week)   daily    Current prosthetic wear tolerance (#hours/day)   most awake hours for bil prostheses, removing to dry as needed with rest about half way through day    Residual limb condition   RLE has lateral small (75m) open area with no signs of infection, normal color, temperature    Education Provided  Skin check;Residual limb care;Proper wear schedule/adjustment;Prosthetic cleaning               PT Short Term Goals - 06/22/18 2226      PT SHORT TERM GOAL #1   Title  Patient verbalizes proper limb care for sweat management. (All STGs Target Date: 06/17/2018)    Baseline  MET 06/22/2018    Time  1    Period  Months    Status  Achieved      PT SHORT TERM GOAL #2   Title  Patient verbalizes options  for pool with & without prostheses to address his goal.     Baseline  MET 06/22/2018    Time  1    Period  Months    Status  Achieved      PT SHORT TERM GOAL #3   Title  Patient performs standing balance with cane support reaching 10" & to floor with supervision.     Baseline  Can reach all the way to the floor and out 12" with support on cane and supervision    Time  1    Period  Months    Status  Achieved      PT SHORT TERM GOAL #4   Title  Patient ambulates 200' with cane & prostheses with supervision.     Baseline  230' with cane and prostheses with supervision until pt fatigued and required min A to regain balance at end of walk, 89%, 93 bpm after two laps    Time  1    Period  Months    Status  Partially Met      PT SHORT TERM GOAL #5   Title  Patient negotiates stairs with 1 rails(to exit/enter friend's house), curbs & ramps with cane & prostheses with minA.     Baseline  stairs with min A with cane; ramp mod A, curb unable with cane as only support    Time  1    Period  Months    Status  Partially Met      PT SHORT TERM GOAL #6   Title  Pt will improve endurance to be able to complete 6 min walk test with 2 standing rest breaks and more upright trunk posture.    Baseline  Completed 4 minutes, 2-3 standing rest breaks    Time  1    Period  Months    Status  Partially Met        PT Long Term Goals - 05/20/18 1513      PT LONG TERM GOAL #1   Title  Patient verbalizes & demonstrates proper prosthetic care including prostheses  with dialysis to enable safe use of bilateral Transtibial prostheses. (All LTGs Target Date: 08/14/2018)    Time  3    Period  Months    Status  New      PT LONG TERM GOAL #2   Title  Patient tolerates wear of bilateral Transtibial prostheses >90% of awake hours without skin issues or limb pain to enable function during most of his day.     Time  3    Period  Months    Status  New      PT LONG TERM GOAL #3   Title  Standing balance with cane  or less support reaching 10" anteriorly, to floor, scanning & managing clothes modified independent.     Time  3    Period  Months    Status  New      PT LONG TERM GOAL #4   Title  Patient ambulates 500' outdoors including grass with cane or less & bilateral prostheses modified independent for community mobility.     Time  3    Period  Months    Status  New      PT LONG TERM GOAL #5   Title  Patient ambulates100'  around furniture carrying plate or cup with cane or less & prostheses modified independent for household mobility.     Time  3    Period  Months    Status  New      Additional Long Term Goals   Additional Long Term Goals  Yes      PT LONG TERM GOAL #6   Title  Patient negotiates ramps, curbs & stairs (1 rail) with cane or less & prostheses modified independent for community access.     Time  3    Period  Months    Status  New      PT LONG TERM GOAL #7   Title  Pt will improve endurance during gait by increasing distance on 6 minute walk test by 150' with LRAD    Baseline  451 in 4 minutes; will re-assess at 4 weeks    Time  3    Period  Months    Status  New    Target Date  08/14/18            Plan - 07/20/18 1800    Clinical Impression Statement  Patient improved transfers (sit to/from stand & floor transfers pushing on horizontal surface) with skilled instruction. Patient improved gait outdoors on grass & longer distance.     Rehab Potential  Good    PT Frequency  2x / week 13 weeks (90 days)    PT Duration  Other (comment)    PT Treatment/Interventions  ADLs/Self Care Home Management;Canalith Repostioning;DME Instruction;Gait training;Stair training;Functional mobility training;Therapeutic activities;Therapeutic exercise;Balance training;Patient/family education;Prosthetic Training;Vestibular    PT Next Visit Plan  Monitor HR & SpO2 for activities.  prosthetic gait & balance with & without cane    Consulted and Agree with Plan of Care  Patient        Patient will benefit from skilled therapeutic intervention in order to improve the following deficits and impairments:  Abnormal gait, Cardiopulmonary status limiting activity, Decreased activity tolerance, Decreased balance, Decreased endurance, Decreased mobility, Decreased knowledge of use of DME, Decreased range of motion, Decreased scar mobility, Decreased strength, Postural dysfunction, Prosthetic Dependency  Visit Diagnosis: Unsteadiness on feet  Other abnormalities of gait and mobility  Muscle weakness (generalized)  Abnormal posture  Problem List Patient Active Problem List   Diagnosis Date Noted  . Non-ST elevation (NSTEMI) myocardial infarction ([place])   . Persistent atrial fibrillation with rapid ventricular response ([place]) 06/15/2018  . Chest pain with moderate risk for cardiac etiology 06/15/2018  . Atrial flutter ([place]) 06/15/2018  . Somniloquy 06/12/2018  . Encounter for therapeutic drug monitoring 05/27/2018  . Chronic systolic heart failure ([place])   . Demand ischemia ([place])   . Atrial fibrillation with RVR ([place]) 05/02/2018  . Murmur, cardiac 02/13/2018  . Mallory-Weiss tear 01/16/2018  . Morbid obesity ([place])   . Fall   . Subtherapeutic international normalized ratio (INR)   . History of left below knee amputation ([place]) 12/11/2017  . History of supraventricular tachycardia   . S/P bilateral BKA (below knee amputation) ([place])   . Anemia of chronic disease   . Leukocytosis   . Non-ischemic cardiomyopathy ([place])   . Hypokalemia 09/04/2017  . Essential hypertension 09/03/2017  . GERD (gastroesophageal reflux disease) 09/03/2017  . GIB (gastrointestinal bleeding) 07/26/2017  . Anemia due to end stage renal disease ([place]) 07/26/2017  . PAF (paroxysmal atrial fibrillation) ([place]) 07/26/2017  . Symptomatic anemia 07/26/2017  . End stage renal disease ([place]) 05/05/2017  . HIV disease ([place]) 03/25/2017  . Hypertensive heart disease with end stage renal disease on  dialysis ([place]) 03/13/2017  . ESRD on dialysis ([place]) 03/13/2017  . Type 2 diabetes mellitus with complication ([place]) 11/01/1593  . SVT (supraventricular tachycardia) ([place])     Carmellia Kreisler PT, DPT 07/21/2018, 8:02 AM  [facility] [address] [place], [place], [address] Phone: [phone]   Fax:  [phone]  Name: Sathvik Tiedt MRN: [id number] Date of Birth: 31-Dec-1965

## 2018-07-22 ENCOUNTER — Ambulatory Visit: Payer: Medicare Other | Admitting: Physical Therapy

## 2018-07-22 ENCOUNTER — Ambulatory Visit (INDEPENDENT_AMBULATORY_CARE_PROVIDER_SITE_OTHER): Payer: Medicare Other | Admitting: Pharmacist

## 2018-07-22 DIAGNOSIS — Z5181 Encounter for therapeutic drug level monitoring: Secondary | ICD-10-CM

## 2018-07-22 DIAGNOSIS — I4891 Unspecified atrial fibrillation: Secondary | ICD-10-CM

## 2018-07-22 LAB — POCT INR: INR: 3.2 — AB (ref 2.0–3.0)

## 2018-07-22 MED ORDER — WARFARIN SODIUM 7.5 MG PO TABS: ORAL_TABLET | ORAL | 2 refills | Status: DC

## 2018-07-22 NOTE — Addendum Note (Signed)
Addended by: Hulan Fray on: 07/22/2018 04:38 PM   Modules accepted: Orders

## 2018-07-22 NOTE — Patient Instructions (Signed)
Description   Continue taking 1 tablet daily except 1/2 tablet on Sundays, Tuesdays and Fridays. Recheck INR in 2 weeks - same day as office visit with Lyda Jester, PA. [facility] [phone] Main [phone]

## 2018-07-27 ENCOUNTER — Ambulatory Visit: Payer: Medicare Other | Admitting: Physical Therapy

## 2018-07-27 MED FILL — BIKTARVY 50-200-25 MG TABS: 50-200-25 | 30 days supply | Qty: 30 | Fill #5

## 2018-07-29 ENCOUNTER — Ambulatory Visit: Payer: Medicare Other | Admitting: Physical Therapy

## 2018-08-03 ENCOUNTER — Ambulatory Visit: Payer: Medicare Other | Attending: Orthopedic Surgery | Admitting: Physical Therapy

## 2018-08-03 DIAGNOSIS — R2681 Unsteadiness on feet: Secondary | ICD-10-CM | POA: Insufficient documentation

## 2018-08-03 DIAGNOSIS — Z9181 History of falling: Secondary | ICD-10-CM | POA: Insufficient documentation

## 2018-08-03 DIAGNOSIS — R2689 Other abnormalities of gait and mobility: Secondary | ICD-10-CM | POA: Insufficient documentation

## 2018-08-03 DIAGNOSIS — R293 Abnormal posture: Secondary | ICD-10-CM | POA: Insufficient documentation

## 2018-08-03 DIAGNOSIS — M6281 Muscle weakness (generalized): Secondary | ICD-10-CM | POA: Insufficient documentation

## 2018-08-05 ENCOUNTER — Encounter: Payer: Self-pay | Admitting: Cardiology

## 2018-08-05 ENCOUNTER — Ambulatory Visit: Payer: Medicare Other | Admitting: Physical Therapy

## 2018-08-05 ENCOUNTER — Ambulatory Visit (INDEPENDENT_AMBULATORY_CARE_PROVIDER_SITE_OTHER): Payer: Medicare Other | Admitting: Cardiology

## 2018-08-05 ENCOUNTER — Ambulatory Visit (INDEPENDENT_AMBULATORY_CARE_PROVIDER_SITE_OTHER): Payer: Medicare Other | Admitting: *Deleted

## 2018-08-05 ENCOUNTER — Encounter: Payer: Self-pay | Admitting: Physical Therapy

## 2018-08-05 VITALS — BP 130/78 | HR 81 | Ht 78.0 in | Wt 290.1 lb

## 2018-08-05 DIAGNOSIS — I4891 Unspecified atrial fibrillation: Secondary | ICD-10-CM

## 2018-08-05 DIAGNOSIS — Z5181 Encounter for therapeutic drug level monitoring: Secondary | ICD-10-CM

## 2018-08-05 DIAGNOSIS — R2689 Other abnormalities of gait and mobility: Secondary | ICD-10-CM

## 2018-08-05 DIAGNOSIS — R2681 Unsteadiness on feet: Secondary | ICD-10-CM | POA: Diagnosis not present

## 2018-08-05 DIAGNOSIS — M6281 Muscle weakness (generalized): Secondary | ICD-10-CM

## 2018-08-05 DIAGNOSIS — I2510 Atherosclerotic heart disease of native coronary artery without angina pectoris: Secondary | ICD-10-CM | POA: Diagnosis not present

## 2018-08-05 DIAGNOSIS — Z9181 History of falling: Secondary | ICD-10-CM

## 2018-08-05 DIAGNOSIS — R293 Abnormal posture: Secondary | ICD-10-CM

## 2018-08-05 LAB — HEPATIC FUNCTION PANEL
ALBUMIN: 4.6 g/dL (ref 3.5–5.5)
ALK PHOS: 113 IU/L (ref 39–117)
ALT: 21 IU/L (ref 0–44)
AST: 15 IU/L (ref 0–40)
BILIRUBIN TOTAL: 0.5 mg/dL (ref 0.0–1.2)
BILIRUBIN, DIRECT: 0.24 mg/dL (ref 0.00–0.40)
TOTAL PROTEIN: 7.2 g/dL (ref 6.0–8.5)

## 2018-08-05 LAB — TSH: TSH: 1.6 u[IU]/mL (ref 0.450–4.500)

## 2018-08-05 LAB — POCT INR: INR: 2.8 (ref 2.0–3.0)

## 2018-08-05 MED ORDER — AMIODARONE HCL 200 MG PO TABS: 200.0000 mg | ORAL_TABLET | Freq: Every day | ORAL | 11 refills | Status: DC

## 2018-08-05 MED ORDER — METOPROLOL SUCCINATE ER 100 MG PO TB24: ORAL_TABLET | ORAL | 1 refills | Status: DC

## 2018-08-05 NOTE — Therapy (Signed)
[employer] [address] [place], [place], [address] Phone: [phone]   Fax:  [phone]  Physical Therapy Treatment  Patient Details  Name: Thomas [name] MRN: [id number] Date of Birth: 1966/05/05 Referring Provider: Meridee Score, MD   Encounter Date: 08/05/2018  PT End of Session - 08/05/18 1251    Visit Number  11 10 visits 05/18/18 - 07/20/18    Number of Visits  26    Date for PT Re-Evaluation  08/14/18    Authorization Type  Medicare & Generic commercial    PT Start Time  1154    PT Stop Time  1232    PT Time Calculation (min)  38 min    Equipment Utilized During Treatment  Gait belt    Activity Tolerance  Patient tolerated treatment well;Patient limited by fatigue    Behavior During Therapy  WFL for tasks assessed/performed       Past Medical History:  Diagnosis Date  . Anemia   . Atrial fibrillation ([place])   . Chest pain with moderate risk for cardiac etiology 06/15/2018  . Chronic combined systolic and diastolic CHF (congestive heart failure) ([place])   . Diabetic foot ulcer ([place]) 09/03/2017  . Diabetic wet gangrene of the foot ([place]) 09/04/2017  . ESRD (end stage renal disease) on dialysis [facility])    [address], [place], [place] (06/15/2018)  . Headache    "monthly" (06/15/2018)  . HIV disease ([place])   . Hyperparathyroidism, secondary ([place])   . Hypertension   . Hypertensive heart disease with end stage renal disease on dialysis ([place]) 03/13/2017  . Pneumonia X 1  . Subacute osteomyelitis, right ankle and foot ([place])   . SVT (supraventricular tachycardia) ([place])    ? afib or atrial flutter s/p TEE/DCCV with subsequent ablation due to reoccurrence in [place]  . Type 2 diabetes mellitus ([place])     Past Surgical History:  Procedure Laterality Date  . AMPUTATION Left    foot  . AMPUTATION Right 12/06/2017   Procedure: AMPUTATION BELOW KNEE;  Surgeon: [name], MD;  Location: [facility];  Service: Orthopedics;   Laterality: Right;  . APPLICATION OF WOUND VAC Right 09/04/2017   Procedure: APPLICATION OF WOUND VAC;  Surgeon: [name], DPM;  Location: [facility];  Service: Podiatry;  Laterality: Right;  . APPLICATION OF WOUND VAC  12/06/2017   Procedure: APPLICATION OF WOUND VAC;  Surgeon: [name], MD;  Location: [facility];  Service: Orthopedics;;  . AV FISTULA PLACEMENT Right 10/19/2012  . AV FISTULA REPAIR Right ~ 02/2018   "had it cleaned out"  . BELOW KNEE LEG AMPUTATION Left ~ 2016  . COLONOSCOPY WITH PROPOFOL N/A 07/30/2017   Procedure: COLONOSCOPY WITH PROPOFOL;  Surgeon: [name], MD;  Location: [facility];  Service: Endoscopy;  Laterality: N/A;  . ESOPHAGOGASTRODUODENOSCOPY (EGD) WITH PROPOFOL N/A 07/28/2017   Procedure: ESOPHAGOGASTRODUODENOSCOPY (EGD) WITH PROPOFOL;  Surgeon: [name], MD;  Location: [facility];  Service: Endoscopy;  Laterality: N/A;  . ESOPHAGOGASTRODUODENOSCOPY (EGD) WITH PROPOFOL N/A 01/15/2018   Procedure: ESOPHAGOGASTRODUODENOSCOPY (EGD) WITH PROPOFOL;  Surgeon: [name], MD;  Location: [facility] ENDOSCOPY;  Service: Endoscopy;  Laterality: N/A;  . FLEXIBLE SIGMOIDOSCOPY N/A 07/27/2017   Procedure: FLEXIBLE SIGMOIDOSCOPY;  Surgeon: [name], MD;  Location: [facility] ENDOSCOPY;  Service: Endoscopy;  Laterality: N/A;  . GRAFT APPLICATION Right 10/23/8526   Procedure: SKIN GRAFT APPLICATION RIGHT FOOT;  Surgeon: [name], DPM;  Location: [facility];  Service:  Podiatry;  Laterality: Right;  . I&D EXTREMITY Right 09/04/2017   Procedure: IRRIGATION AND DEBRIDEMENT EXTREMITY;  Surgeon: [name], DPM;  Location: [facility];  Service: Podiatry;  Laterality: Right;  . I&D EXTREMITY Right 11/12/2017   Procedure: IRRIGATION AND DEBRIDEMENT ULCER RIGHT FOOT;  Surgeon: [name], DPM;  Location: [facility];  Service: Podiatry;  Laterality: Right;  . LEFT HEART CATH AND CORONARY ANGIOGRAPHY N/A 06/18/2018   Procedure: LEFT HEART CATH AND CORONARY ANGIOGRAPHY;   Surgeon: [name], Peter M, MD;  Location: [facility];  Service: Cardiovascular;  Laterality: N/A;  . WOUND DEBRIDEMENT N/A 09/24/2017   Procedure: DEBRIDEMENT WOUND;  Surgeon: [name], DPM;  Location: [facility];  Service: Podiatry;  Laterality: N/A;    There were no vitals filed for this visit.  Subjective Assessment - 08/05/18 1157    Subjective  Pt went swimming at the pool in [facility]. No issues.    Pertinent History  R TTA, ESRD, DM2, HIV, cardiomyopathy, CHF, Tachycardia, A-Fib, Mallory-Weiss Tear, obesity    Limitations  Standing;Walking;House hold activities    Patient Stated Goals  To use bilateral prostheses to dance with wife & swimming,     Currently in Pain?  No/denies                       [facility] Adult PT Treatment/Exercise - 08/05/18 0001      Ambulation/Gait   Ambulation/Gait  Yes    Ambulation/Gait Assistance  4: Min guard;5: Supervision;4: Min assist    Ambulation/Gait Assistance Details  Min guard amb. without cane or with cane on grassy surface.  Supervision with cane on level surface; pt occasionally self corrects imbalances or reaches for external support to correct.                      Min A on grassy surface negotiating slight decline, with cane    Ambulation Distance (Feet)  500 Feet    Assistive device  Prostheses;None;Straight cane quad tip    Gait Pattern  Step-through pattern;Trunk flexed    Ambulation Surface  Outdoor;Paved;Gravel;Grass;Unlevel;Level      Prosthetics   Current prosthetic wear tolerance (days/week)   daily    Current prosthetic wear tolerance (#hours/day)   most awake hours for bil prostheses, removing to dry as needed with rest about half way through day    Residual limb condition    Small open area on RLE lateral, closed per pt;    Person(s) Educated  Patient    Education Method  Explanation    Education Method  Verbalized understanding          Balance Exercises - 08/05/18 1207      Balance Exercises:  Standing   Standing Eyes Opened  Wide ([place]);Head turns;Foam/compliant surface + upper trunk rotations; each with UE support + standing on non compliant reaching to ground for 4" object, progress to walking forward with the cane and picking up objects.     Turning  Both amb. around obstacle, sharp turns without AD, supervison to  Min guard   Other Standing Exercises  walking forward to dot targets working on narrow BOS with cane and head turns        PT Education - 08/05/18 1250    Education provided  Yes    Education Details  Discussed POC rationale and LTGs       PT Short Term Goals - 06/22/18 2226  PT SHORT TERM GOAL #1   Title  Patient verbalizes proper limb care for sweat management. (All STGs Target Date: 06/17/2018)    Baseline  MET 06/22/2018    Time  1    Period  Months    Status  Achieved      PT SHORT TERM GOAL #2   Title  Patient verbalizes options for pool with & without prostheses to address his goal.     Baseline  MET 06/22/2018    Time  1    Period  Months    Status  Achieved      PT SHORT TERM GOAL #3   Title  Patient performs standing balance with cane support reaching 10" & to floor with supervision.     Baseline  Can reach all the way to the floor and out 12" with support on cane and supervision    Time  1    Period  Months    Status  Achieved      PT SHORT TERM GOAL #4   Title  Patient ambulates 200' with cane & prostheses with supervision.     Baseline  230' with cane and prostheses with supervision until pt fatigued and required min A to regain balance at end of walk, 89%, 93 bpm after two laps    Time  1    Period  Months    Status  Partially Met      PT SHORT TERM GOAL #5   Title  Patient negotiates stairs with 1 rails(to exit/enter friend's house), curbs & ramps with cane & prostheses with minA.     Baseline  stairs with min A with cane; ramp mod A, curb unable with cane as only support    Time  1    Period  Months    Status  Partially Met       PT SHORT TERM GOAL #6   Title  Pt will improve endurance to be able to complete 6 min walk test with 2 standing rest breaks and more upright trunk posture.    Baseline  Completed 4 minutes, 2-3 standing rest breaks    Time  1    Period  Months    Status  Partially Met        PT Long Term Goals - 05/20/18 1513      PT LONG TERM GOAL #1   Title  Patient verbalizes & demonstrates proper prosthetic care including prostheses with dialysis to enable safe use of bilateral Transtibial prostheses. (All LTGs Target Date: 08/14/2018)    Time  3    Period  Months    Status  New      PT LONG TERM GOAL #2   Title  Patient tolerates wear of bilateral Transtibial prostheses >90% of awake hours without skin issues or limb pain to enable function during most of his day.     Time  3    Period  Months    Status  New      PT LONG TERM GOAL #3   Title  Standing balance with cane or less support reaching 10" anteriorly, to floor, scanning & managing clothes modified independent.     Time  3    Period  Months    Status  New      PT LONG TERM GOAL #4   Title  Patient ambulates 500' outdoors including grass with cane or less & bilateral prostheses modified independent for community mobility.  Time  3    Period  Months    Status  New      PT LONG TERM GOAL #5   Title  Patient ambulates100'  around furniture carrying plate or cup with cane or less & prostheses modified independent for household mobility.     Time  3    Period  Months    Status  New      Additional Long Term Goals   Additional Long Term Goals  Yes      PT LONG TERM GOAL #6   Title  Patient negotiates ramps, curbs & stairs (1 rail) with cane or less & prostheses modified independent for community access.     Time  3    Period  Months    Status  New      PT LONG TERM GOAL #7   Title  Pt will improve endurance during gait by increasing distance on 6 minute walk test by 150' with LRAD    Baseline  451 in 4 minutes; will  re-assess at 4 weeks    Time  3    Period  Months    Status  New    Target Date  08/14/18            Plan - 08/05/18 1252    Clinical Impression Statement  Balance training: Pt able to reach object 4 " up form floor but requires AD for support.  Standing on compliant surface with head turns and weight shifts; pt requires 1 UE support.  Gait training: Pt able to walk longer distances without SPC while negotiating obstacles, about 80', requiring intermittent min guard for imblance.  Pt is progressing with outdoor walking distance, requiring multiple standing rest breaks and close supervision to min guard with SPC.                                                  Rehab Potential  Good    PT Frequency  2x / week 13 weeks (90 days)    PT Duration  Other (comment)    PT Treatment/Interventions  ADLs/Self Care Home Management;Canalith Repostioning;DME Instruction;Gait training;Stair training;Functional mobility training;Therapeutic activities;Therapeutic exercise;Balance training;Patient/family education;Prosthetic Training;Vestibular    PT Next Visit Plan  Check LTGs and discuss POC.    Consulted and Agree with Plan of Care  Patient       Patient will benefit from skilled therapeutic intervention in order to improve the following deficits and impairments:  Abnormal gait, Cardiopulmonary status limiting activity, Decreased activity tolerance, Decreased balance, Decreased endurance, Decreased mobility, Decreased knowledge of use of DME, Decreased range of motion, Decreased scar mobility, Decreased strength, Postural dysfunction, Prosthetic Dependency  Visit Diagnosis: Unsteadiness on feet  Other abnormalities of gait and mobility  Muscle weakness (generalized)  Abnormal posture  History of falling     Problem List Patient Active Problem List   Diagnosis Date Noted  . Non-ST elevation (NSTEMI) myocardial infarction ([place])   . Persistent atrial fibrillation with rapid ventricular  response ([place]) 06/15/2018  . Chest pain with moderate risk for cardiac etiology 06/15/2018  . Atrial flutter ([place]) 06/15/2018  . Somniloquy 06/12/2018  . Encounter for therapeutic drug monitoring 05/27/2018  . Chronic systolic heart failure ([place])   . Demand ischemia ([place])   . Atrial fibrillation with RVR ([place]) 05/02/2018  . Murmur, cardiac 02/13/2018  .  Mallory-Weiss tear 01/16/2018  . Morbid obesity ([place])   . Fall   . Subtherapeutic international normalized ratio (INR)   . History of left below knee amputation ([place]) 12/11/2017  . History of supraventricular tachycardia   . S/P bilateral BKA (below knee amputation) ([place])   . Anemia of chronic disease   . Leukocytosis   . Non-ischemic cardiomyopathy ([place])   . Hypokalemia 09/04/2017  . Essential hypertension 09/03/2017  . GERD (gastroesophageal reflux disease) 09/03/2017  . GIB (gastrointestinal bleeding) 07/26/2017  . Anemia due to end stage renal disease ([place]) 07/26/2017  . PAF (paroxysmal atrial fibrillation) ([place]) 07/26/2017  . Symptomatic anemia 07/26/2017  . End stage renal disease ([place]) 05/05/2017  . HIV disease ([place]) 03/25/2017  . Hypertensive heart disease with end stage renal disease on dialysis ([place]) 03/13/2017  . ESRD on dialysis ([place]) 03/13/2017  . Type 2 diabetes mellitus with complication ([place]) 22/17/9810  . SVT (supraventricular tachycardia) ([place])     Bjorn Loser, PTA  08/05/18, 1:06 PM [facility] [address] [place], [place], [address] Phone: [phone]   Fax:  [phone]  Name: Thomas [name] MRN: [id number] Date of Birth: [birth year]

## 2018-08-05 NOTE — Progress Notes (Signed)
08/05/2018 Thomas [name]   1966-06-23  [id number]  Primary Physician [name], MD Primary Cardiologist: Dr. [name]   Reason for Visit/CC: F/u for Atrial Flutter, Chronic Systolic HF and CAD  HPI:  Thomas [name] is a 52 y.o. male who is being seen today for f/u atrial flutter, chronic systolic HF and CAD. He has other multiple background illnesses, which include controlled HIV infection, insulin requiring diabetes mellitus, end-stage renal disease on hemodialysis and PAD with bilateral below-the-knee amputations.  He was recently admitted to the hospital in June for angina and atypical atrial flutter. 2D echo showed redcued LVEF, down to 35-40%. Cardiac cath showed severe 2 vessel obstructive CAD. The entire coronary tree is severely calcified (angiographic details are outlined below in cath report). Dr. [name] recommended medical management. Per Dr. Doug Sou cath report, "I would recommend medical management. Other than the mid LAD and first diagonal the other disease is in smaller distal branches. The diagonal is occluded. The mid LAD could potentially be treated with atherectomy and stenting but I would not advise this unless he has refractory angina on optimal medical therapy and treatment of his arrhythmia. I also don't think this would add much to his overall prognosis". He was placed on medical therapy. Nephrology assisted with HD for volume removal to treat acute CHF.  For his atrial flutter, he was started on IV amiodarone and transition to p.o. at a dose of 400 mg daily.  He was discharged on this dose and Coumadin was continued.  It should also be noted that the patient was observed to have a slightly prolonged QT interval which was recommended to be followed closely in the outpatient setting.  He was seen back for post hospital follow-up on July 01, 2018.  EKG showed normal sinus rhythm.  His amiodarone dose was further reduced down to 200 mg daily.  He is back today for follow-up.   Repeat EKG shows that he is maintaining normal sinus rhythm with amiodarone.  His heart rate is 79 bpm.  QT/QTc is slightly prolonged at 460/527 ms.  His EKG was reviewed by Dr. Marlou Porch, Dr. of the day who recommends continuation of current dose of amiodarone at 200 mg daily, as long as he is not had any syncope or near syncope, which the patient reports that he has not.  The patient denies any cardiac symptoms.  No palpitations.  No chest pain or dyspnea.  He has been compliant with hemodialysis and volume appears stable.  Blood pressure is controlled at 130/78.  He reports full compliance with Coumadin.  No abnormal bleeding.    Cardiac cath 06/18/18   Procedures   LEFT HEART CATH AND CORONARY ANGIOGRAPHY  Conclusion     Prox LAD lesion is 30% stenosed.  Mid LAD lesion is 80% stenosed.  Ost 3rd Diag lesion is 95% stenosed.  Dist LAD lesion is 90% stenosed.  Ost 1st Diag lesion is 100% stenosed.  Acute Mrg lesion is 95% stenosed.  1st RPLB lesion is 100% stenosed.  Post Atrio lesion is 95% stenosed.  There is mild to moderate left ventricular systolic dysfunction.  LV end diastolic pressure is normal.  1. Severe 2 vessel obstructive CAD. The entire coronary tree is severely calcified. - 100% large first diagonal vessel. This fills late by left to left collaterals. - 80% eccentric mid LAD - 90% distal LAD - 95% small third diagonal - 95% RV marginal branch. - 100% first PLOM - 95% ongoing PL branch of the RCA 2.  Mild to moderate LV dysfunction with global hypokinesis. EF estimated at 40-45% 3. Normal LVEDP  Plan: I would recommend medical management. Other than the mid LAD and first diagonal the other disease is in smaller distal branches. The diagonal is occluded. The mid LAD could potentially be treated with atherectomy and stenting but I would not advise this unless he has refractory angina on optimal medical therapy and treatment of his  arrhythmia. I also don't think this would add much to his overall prognosis. If no bleeding from cath site can resume coumadin tomorrow.     Echo 05/07/17  Study Conclusions  - Left ventricle: The cavity size was moderately dilated. Wall thickness was increased in a pattern of moderate LVH. Systolic function was moderately reduced. The estimated ejection fraction was in the range of 35% to 40%. Akinesis of the inferior myocardium. Features are consistent with a pseudonormal left ventricular filling pattern, with concomitant abnormal relaxation and increased filling pressure (grade 2 diastolic dysfunction). - Aortic valve: There was mild regurgitation. - Mitral valve: There was mild regurgitation. - Left atrium: The atrium was severely dilated. - Right atrium: The atrium was mildly dilated.    Current Meds  Medication Sig  . amiodarone (PACERONE) 200 MG tablet Take 1 tablet (200 mg total) by mouth daily.  . bictegravir-emtricitabine-tenofovir AF (BIKTARVY) 50-200-25 MG TABS tablet Take 1 tablet by mouth daily.  . camphor-menthol (SARNA) lotion Apply topically as needed for itching.  . cinacalcet (SENSIPAR) 90 MG tablet Take 90 mg by mouth daily.  . ferric gluconate 125 mg in sodium chloride 0.9 % 100 mL Inject 125 mg into the vein Every Tuesday,Thursday,and Saturday with dialysis.  Marland Kitchen gabapentin (NEURONTIN) 100 MG capsule Take 1 capsule (100 mg total) by mouth every dialysis.  Marland Kitchen insulin glargine (LANTUS) 100 unit/mL SOPN Inject 0.3 mLs (30 Units total) into the skin at bedtime. (Patient taking differently: Inject 20 Units into the skin at bedtime. )  . metoprolol succinate (TOPROL-XL) 100 MG 24 hr tablet TAKE 1 TABLET BY MOUTH ONCE DAILY TAKE WITH OR IMMEDIATELY FOLLOWING A MEAL  . pantoprazole (PROTONIX) 40 MG tablet Take 2 tablets (80 mg total) by mouth daily.  . polycarbophil (FIBERCON) 625 MG tablet Take 1 tablet (625 mg total) by mouth daily.  . rosuvastatin (CRESTOR)  20 MG tablet Take 1 tablet (20 mg total) by mouth daily at 6 PM.  . sevelamer carbonate (RENVELA) 800 MG tablet Take 800 mg by mouth 3 (three) times daily with meals.   . warfarin (COUMADIN) 7.5 MG tablet TAKE 1 TABLET BY MOUTH DAILY, EXCEPT 1/2 TABLET ON SUNDAYS, TUESDAYS AND FRIDAYS OR AS DIRECTED BY ANTICOAGULATION CLINIC  . [DISCONTINUED] amiodarone (PACERONE) 200 MG tablet Take 1 tablet (200 mg total) by mouth daily. Start taking 200 mg daily on 07/03/18  . [DISCONTINUED] amiodarone (PACERONE) 200 MG tablet Take 1 tablet (200 mg total) by mouth daily.  . [DISCONTINUED] metoprolol succinate (TOPROL-XL) 100 MG 24 hr tablet TAKE 1 TABLET BY MOUTH ONCE DAILY TAKE WITH OR IMMEDIATELY FOLLOWING A MEAL   Allergies  Allergen Reactions  . Oxycodone-Acetaminophen Other (See Comments)    Hallucinations/delirum   Past Medical History:  Diagnosis Date  . Anemia   . Atrial fibrillation ([place])   . Chest pain with moderate risk for cardiac etiology 06/15/2018  . Chronic combined systolic and diastolic CHF (congestive heart failure) ([place])   . Diabetic foot ulcer ([place]) 09/03/2017  . Diabetic wet gangrene of the foot ([place]) 09/04/2017  .  ESRD (end stage renal disease) on dialysis [facility])    [place] [address] T, Th, [place] (06/15/2018)  . Headache    "monthly" (06/15/2018)  . HIV disease ([place])   . Hyperparathyroidism, secondary ([place])   . Hypertension   . Hypertensive heart disease with end stage renal disease on dialysis ([place]) 03/13/2017  . Pneumonia X 1  . Subacute osteomyelitis, right ankle and foot ([place])   . SVT (supraventricular tachycardia) ([place])    ? afib or atrial flutter s/p TEE/DCCV with subsequent ablation due to reoccurrence in [place]  . Type 2 diabetes mellitus ([facility])    Family History  Problem Relation Age of Onset  . Heart disease Mother   . Diabetes Mother   . Heart disease Father   . Cancer Neg Hx    Past Surgical History:  Procedure Laterality Date  . AMPUTATION Left    foot  . AMPUTATION  Right 12/06/2017   Procedure: AMPUTATION BELOW KNEE;  Surgeon: [name], MD;  Location: [place];  Service: Orthopedics;  Laterality: Right;  . APPLICATION OF WOUND VAC Right 09/04/2017   Procedure: APPLICATION OF WOUND VAC;  Surgeon: [name], DPM;  Location: [place];  Service: Podiatry;  Laterality: Right;  . APPLICATION OF WOUND VAC  12/06/2017   Procedure: APPLICATION OF WOUND VAC;  Surgeon: [name], MD;  Location: [place];  Service: Orthopedics;;  . AV FISTULA PLACEMENT Right 10/19/2012  . AV FISTULA REPAIR Right ~ 02/2018   "had it cleaned out"  . BELOW KNEE LEG AMPUTATION Left ~ 2016  . COLONOSCOPY WITH PROPOFOL N/A 07/30/2017   Procedure: COLONOSCOPY WITH PROPOFOL;  Surgeon: [name], MD;  Location: [place];  Service: Endoscopy;  Laterality: N/A;  . ESOPHAGOGASTRODUODENOSCOPY (EGD) WITH PROPOFOL N/A 07/28/2017   Procedure: ESOPHAGOGASTRODUODENOSCOPY (EGD) WITH PROPOFOL;  Surgeon: [name], MD;  Location: [place];  Service: Endoscopy;  Laterality: N/A;  . ESOPHAGOGASTRODUODENOSCOPY (EGD) WITH PROPOFOL N/A 01/15/2018   Procedure: ESOPHAGOGASTRODUODENOSCOPY (EGD) WITH PROPOFOL;  Surgeon: [name], MD;  Location: [facility] END[facility]COPY;  Service: Endoscopy;  Laterality: N/A;  . FLEXIBLE SIGMOIDOSCOPY N/A 07/27/2017   Procedure: FLEXIBLE SIGMOIDOSCOPY;  Surgeon: [name], MD;  Location: [facility] ENDOSCOPY;  Service: Endoscopy;  Laterality: N/A;  . GRAFT APPLICATION Right 62/13/0865   Procedure: SKIN GRAFT APPLICATION RIGHT FOOT;  Surgeon: [name], DPM;  Location: [place];  Service: Podiatry;  Laterality: Right;  . I&D EXTREMITY Right 09/04/2017   Procedure: IRRIGATION AND DEBRIDEMENT EXTREMITY;  Surgeon: [name], DPM;  Location: [place];  Service: Podiatry;  Laterality: Right;  . I&D EXTREMITY Right 11/12/2017   Procedure: IRRIGATION AND DEBRIDEMENT ULCER RIGHT FOOT;  Surgeon: [name], DPM;  Location: [place];  Service: Podiatry;  Laterality:  Right;  . LEFT HEART CATH AND CORONARY ANGIOGRAPHY N/A 06/18/2018   Procedure: LEFT HEART CATH AND CORONARY ANGIOGRAPHY;  Surgeon: [name], Peter M, MD;  Location: [place] CV LAB;  Service: Cardiovascular;  Laterality: N/A;  . WOUND DEBRIDEMENT N/A 09/24/2017   Procedure: DEBRIDEMENT WOUND;  Surgeon: [name], DPM;  Location: [place];  Service: Podiatry;  Laterality: N/A;   Social History   Socioeconomic History  . Marital status: Married    Spouse name: kim  . Number of children: 3  . Years of education: college  . Highest education level: Not on file  Occupational History  . Occupation: disabled  Social Needs  . Financial resource strain: Not on file  .  Food insecurity:    Worry: Not on file    Inability: Not on file  . Transportation needs:    Medical: Not on file    Non-medical: Not on file  Tobacco Use  . Smoking status: Never Smoker  . Smokeless tobacco: Never Used  Substance and Sexual Activity  . Alcohol use: Never    Frequency: Never  . Drug use: Never  . Sexual activity: Not Currently  Lifestyle  . Physical activity:    Days per week: Not on file    Minutes per session: Not on file  . Stress: Not on file  Relationships  . Social connections:    Talks on phone: Not on file    Gets together: Not on file    Attends religious service: Not on file    Active member of club or organization: Not on file    Attends meetings of clubs or organizations: Not on file    Relationship status: Not on file  . Intimate partner violence:    Fear of current or ex partner: Not on file    Emotionally abused: Not on file    Physically abused: Not on file    Forced sexual activity: Not on file  Other Topics Concern  . Not on file  Social History Narrative   Lives in [place] with wife.     Review of Systems: General: negative for chills, fever, night sweats or weight changes.  Cardiovascular: negative for chest pain, dyspnea on exertion, edema, orthopnea,  palpitations, paroxysmal nocturnal dyspnea or shortness of breath Dermatological: negative for rash Respiratory: negative for cough or wheezing Urologic: negative for hematuria Abdominal: negative for nausea, vomiting, diarrhea, bright red blood per rectum, melena, or hematemesis Neurologic: negative for visual changes, syncope, or dizziness All other systems reviewed and are otherwise negative except as noted above.   Physical Exam:  Blood pressure 130/78, pulse 81, height 6\' 6"  (1.981 m), weight 290 lb 1.6 oz (131.6 kg), SpO2 94 %.  General appearance: alert, cooperative, no distress and moderately obese Neck: no carotid bruit and no JVD Lungs: clear to auscultation bilaterally Heart: regular rate and rhythm, S1, S2 normal, no murmur, click, rub or gallop Extremities: bilateral prosthesis s/p bilateral BKAs Pulses: 2+ and symmetric Skin: Skin color, texture, turgor normal. No rashes or lesions Neurologic: Grossly normal  EKG NSR, 79 bpm, prolonged QT/QTc -- personally reviewed   ASSESSMENT AND PLAN:   1. Atrial Flutter: treated with amiodarone. Dose was recently decreased at last OV 07/01/18 down to 200 mg daily. EKG today shows NSR. Rate is controlled in the 70s with metoprolol. He is on anticoagulation therapy w/ Coumadin and denies abnormal bleeding. We will check TSH and HFTs today for medication monitoring (amiodarone). QT/QTc is slightly prolonged at 460/527 ms, which has also been noted on prior EKGs.  His EKG was reviewed by Dr. Marlou Porch, Dr. of the day, who recommends continuation of current dose of amiodarone at 200 mg daily, as long as he is not had any syncope or near syncope, which the patient reports that he has not. He was advised to refrain from QT prolonging drugs, such as Zofran.   2. Chronic Systolic HF: EF 93-26% on recent echo. Evulemic on exam. Denies dyspnea. Volume controlled at HD. Continue w/ BB. Given he has CKD and BP is better controlled at <130/80, we will hold  off on addition of Losartan.    3. CAD: recent cath with findings outlined above. Medical therapy elected. He is  stable w/o angina.   4. ESRD: on HD T, TH, Sat  5. HTN: at goal and controlled on current regimen.   6. DM: h/o bilateral BKA. Followed by PCP.    Follow-Up w/ Dr. [name] in 3 months.   Brittainy Ladoris Gene, MHS [facility] 08/05/2018 12:54 PM

## 2018-08-05 NOTE — Patient Instructions (Signed)
Description   Continue taking 1 tablet daily except 1/2 tablet on Sundays, Tuesdays and Fridays. Recheck INR in 4 weeks.  [facility] [phone] Main [phone]

## 2018-08-05 NOTE — Patient Instructions (Addendum)
Medication Instructions:   Your physician recommends that you continue on your current medications as directed. Please refer to the Current Medication list given to you today.   If you need a refill on your cardiac medications before your next appointment, please call your pharmacy.  Labwork:  TSH AND HFT TODAY    Testing/Procedures:NONE ORDERED  TODAY    Follow-Up: IN 3 MONTH IWTH DR TURNER    Any Other Special Instructions Will Be Listed Below (If Applicable).

## 2018-08-10 ENCOUNTER — Encounter: Payer: Self-pay | Admitting: Physical Therapy

## 2018-08-10 ENCOUNTER — Ambulatory Visit: Payer: Medicare Other | Admitting: Physical Therapy

## 2018-08-10 DIAGNOSIS — R2681 Unsteadiness on feet: Secondary | ICD-10-CM

## 2018-08-10 DIAGNOSIS — Z9181 History of falling: Secondary | ICD-10-CM

## 2018-08-10 DIAGNOSIS — R293 Abnormal posture: Secondary | ICD-10-CM

## 2018-08-10 DIAGNOSIS — R2689 Other abnormalities of gait and mobility: Secondary | ICD-10-CM

## 2018-08-10 DIAGNOSIS — M6281 Muscle weakness (generalized): Secondary | ICD-10-CM

## 2018-08-11 NOTE — Therapy (Signed)
[facility] [address] [place], [place], [address] Phone: [phone]   Fax:  [phone]  Physical Therapy Treatment  Patient Details  Name: Thomas [name] MRN: [id number] Date of Birth: August 20, 1966 Referring Provider: Meridee Score, MD   Encounter Date: 08/10/2018  PT End of Session - 08/10/18 1314    Visit Number  12    Number of Visits  26    Date for PT Re-Evaluation  08/14/18    Authorization Type  Medicare & Generic commercial    PT Start Time  1230    PT Stop Time  1313    PT Time Calculation (min)  43 min    Equipment Utilized During Treatment  Gait belt    Activity Tolerance  Patient tolerated treatment well;Patient limited by fatigue    Behavior During Therapy  [facility] for tasks assessed/performed       Past Medical History:  Diagnosis Date  . Anemia   . Atrial fibrillation ([place])   . Chest pain with moderate risk for cardiac etiology 06/15/2018  . Chronic combined systolic and diastolic CHF (congestive heart failure) ([name])   . Diabetic foot ulcer ([place]) 09/03/2017  . Diabetic wet gangrene of the foot ([place]) 09/04/2017  . ESRD (end stage renal disease) on dialysis [facility])    [facility] [address] [place], [place], [place] (06/15/2018)  . Headache    "monthly" (06/15/2018)  . HIV disease ([place])   . Hyperparathyroidism, secondary ([place])   . Hypertension   . Hypertensive heart disease with end stage renal disease on dialysis ([place]) 03/13/2017  . Pneumonia X 1  . Subacute osteomyelitis, right ankle and foot ([place])   . SVT (supraventricular tachycardia) ([place])    ? afib or atrial flutter s/p TEE/DCCV with subsequent ablation due to reoccurrence in [place]  . Type 2 diabetes mellitus ([place])     Past Surgical History:  Procedure Laterality Date  . AMPUTATION Left    foot  . AMPUTATION Right 12/06/2017   Procedure: AMPUTATION BELOW KNEE;  Surgeon: [name], MD;  Location: [facility];  Service: Orthopedics;  Laterality: Right;  . APPLICATION OF  WOUND VAC Right 09/04/2017   Procedure: APPLICATION OF WOUND VAC;  Surgeon: [name], DPM;  Location: [facility];  Service: Podiatry;  Laterality: Right;  . APPLICATION OF WOUND VAC  12/06/2017   Procedure: APPLICATION OF WOUND VAC;  Surgeon: [name], MD;  Location: [facility];  Service: Orthopedics;;  . AV FISTULA PLACEMENT Right 10/19/2012  . AV FISTULA REPAIR Right ~ 02/2018   "had it cleaned out"  . BELOW KNEE LEG AMPUTATION Left ~ 2016  . COLONOSCOPY WITH PROPOFOL N/A 07/30/2017   Procedure: COLONOSCOPY WITH PROPOFOL;  Surgeon: [name], MD;  Location: [facility];  Service: Endoscopy;  Laterality: N/A;  . ESOPHAGOGASTRODUODENOSCOPY (EGD) WITH PROPOFOL N/A 07/28/2017   Procedure: ESOPHAGOGASTRODUODENOSCOPY (EGD) WITH PROPOFOL;  Surgeon: [name], MD;  Location: [facility];  Service: Endoscopy;  Laterality: N/A;  . ESOPHAGOGASTRODUODENOSCOPY (EGD) WITH PROPOFOL N/A 01/15/2018   Procedure: ESOPHAGOGASTRODUODENOSCOPY (EGD) WITH PROPOFOL;  Surgeon: [name], MD;  Location: [facility] ENDOSCOPY;  Service: Endoscopy;  Laterality: N/A;  . FLEXIBLE SIGMOIDOSCOPY N/A 07/27/2017   Procedure: FLEXIBLE SIGMOIDOSCOPY;  Surgeon: [name], MD;  [facility]COPY;  Service: Endoscopy;  Laterality: N/A;  . GRAFT APPLICATION Right 91/66/0600   Procedure: SKIN GRAFT APPLICATION RIGHT FOOT;  Surgeon: [name], DPM;  Location: [facility];  Service: Podiatry;  Laterality: Right;  .  I&D EXTREMITY Right 09/04/2017   Procedure: IRRIGATION AND DEBRIDEMENT EXTREMITY;  Surgeon: [name], DPM;  Location: [facility];  Service: Podiatry;  Laterality: Right;  . I&D EXTREMITY Right 11/12/2017   Procedure: IRRIGATION AND DEBRIDEMENT ULCER RIGHT FOOT;  Surgeon: [name], DPM;  Location: [facility];  Service: Podiatry;  Laterality: Right;  . LEFT HEART CATH AND CORONARY ANGIOGRAPHY N/A 06/18/2018   Procedure: LEFT HEART CATH AND CORONARY ANGIOGRAPHY;  Surgeon: [name], Peter M, MD;  Location: [facility];  Service: Cardiovascular;  Laterality: N/A;  . WOUND DEBRIDEMENT N/A 09/24/2017   Procedure: DEBRIDEMENT WOUND;  Surgeon: [name], DPM;  Location: [facility];  Service: Podiatry;  Laterality: N/A;    There were no vitals filed for this visit.  Subjective Assessment - 08/10/18 1230    Subjective  He has been doing a lot of walking. No falls.     Pertinent History  R TTA, ESRD, DM2, HIV, cardiomyopathy, CHF, Tachycardia, A-Fib, Mallory-Weiss Tear, obesity    Limitations  Standing;Walking;House hold activities    Patient Stated Goals  To use bilateral prostheses to dance with wife & swimming,     Currently in Pain?  No/denies                       [facility] Adult PT Treatment/Exercise - 08/10/18 1230      Ambulation/Gait   Ambulation/Gait  Yes    Ambulation/Gait Assistance  4: Min guard;5: Supervision;4: Min assist   supervision cane, MinA to Min guard no device   Ambulation Distance (Feet)  400 Feet   400' outdoors & 200' indoors cane & 100' X 2 no device   Assistive device  Prostheses;None;Straight cane   quad tip   Gait Pattern  Step-through pattern;Trunk flexed    Ambulation Surface  Level;Indoor;Outdoor;Paved;Grass    Stairs  Yes    Stairs Assistance  5: Supervision    Stair Management Technique  Two rails;Alternating pattern;One rail Right;Step to pattern;Forwards   2 rails alternating pattern, 1 rail step-to switching lead    Number of Stairs  4   4 reps   Ramp  5: Supervision   cane & prostheses   Curb  4: Min assist   cane & prostheses     Prosthetics   Current prosthetic wear tolerance (days/week)   daily    Current prosthetic wear tolerance (#hours/day)   most awake hours for bil prostheses, removing to dry as needed with rest about half way through day    Residual limb condition   --               PT Short Term Goals - 06/22/18 2226      PT SHORT TERM GOAL #1   Title  Patient verbalizes proper limb care for sweat  management. (All STGs Target Date: 06/17/2018)    Baseline  MET 06/22/2018    Time  1    Period  Months    Status  Achieved      PT SHORT TERM GOAL #2   Title  Patient verbalizes options for pool with & without prostheses to address his goal.     Baseline  MET 06/22/2018    Time  1    Period  Months    Status  Achieved      PT SHORT TERM GOAL #3   Title  Patient performs standing balance with cane support reaching 10" & to floor with supervision.  Baseline  Can reach all the way to the floor and out 12" with support on cane and supervision    Time  1    Period  Months    Status  Achieved      PT SHORT TERM GOAL #4   Title  Patient ambulates 200' with cane & prostheses with supervision.     Baseline  230' with cane and prostheses with supervision until pt fatigued and required min A to regain balance at end of walk, 89%, 93 bpm after two laps    Time  1    Period  Months    Status  Partially Met      PT SHORT TERM GOAL #5   Title  Patient negotiates stairs with 1 rails(to exit/enter friend's house), curbs & ramps with cane & prostheses with minA.     Baseline  stairs with min A with cane; ramp mod A, curb unable with cane as only support    Time  1    Period  Months    Status  Partially Met      PT SHORT TERM GOAL #6   Title  Pt will improve endurance to be able to complete 6 min walk test with 2 standing rest breaks and more upright trunk posture.    Baseline  Completed 4 minutes, 2-3 standing rest breaks    Time  1    Period  Months    Status  Partially Met        PT Long Term Goals - 05/20/18 1513      PT LONG TERM GOAL #1   Title  Patient verbalizes & demonstrates proper prosthetic care including prostheses with dialysis to enable safe use of bilateral Transtibial prostheses. (All LTGs Target Date: 08/14/2018)    Time  3    Period  Months    Status  New      PT LONG TERM GOAL #2   Title  Patient tolerates wear of bilateral Transtibial prostheses >90% of awake  hours without skin issues or limb pain to enable function during most of his day.     Time  3    Period  Months    Status  New      PT LONG TERM GOAL #3   Title  Standing balance with cane or less support reaching 10" anteriorly, to floor, scanning & managing clothes modified independent.     Time  3    Period  Months    Status  New      PT LONG TERM GOAL #4   Title  Patient ambulates 500' outdoors including grass with cane or less & bilateral prostheses modified independent for community mobility.     Time  3    Period  Months    Status  New      PT LONG TERM GOAL #5   Title  Patient ambulates100'  around furniture carrying plate or cup with cane or less & prostheses modified independent for household mobility.     Time  3    Period  Months    Status  New      Additional Long Term Goals   Additional Long Term Goals  Yes      PT LONG TERM GOAL #6   Title  Patient negotiates ramps, curbs & stairs (1 rail) with cane or less & prostheses modified independent for community access.     Time  3    Period  Months  Status  New      PT LONG TERM GOAL #7   Title  Pt will improve endurance during gait by increasing distance on 6 minute walk test by 150' with LRAD    Baseline  451 in 4 minutes; will re-assess at 4 weeks    Time  3    Period  Months    Status  New    Target Date  08/14/18            Plan - 08/11/18 1702    Clinical Impression Statement  patient requesting additional PT after this week to improve ability to negotiate curbs with cane and gait with bilateral Transtibial Prostheses only / no device.     Rehab Potential  Good    PT Frequency  2x / week   13 weeks (90 days)   PT Duration  Other (comment)    PT Treatment/Interventions  ADLs/Self Care Home Management;Canalith Repostioning;DME Instruction;Gait training;Stair training;Functional mobility training;Therapeutic activities;Therapeutic exercise;Balance training;Patient/family education;Prosthetic  Training;Vestibular    PT Next Visit Plan  assess LTGs and recertify.     Consulted and Agree with Plan of Care  Patient       Patient will benefit from skilled therapeutic intervention in order to improve the following deficits and impairments:  Abnormal gait, Cardiopulmonary status limiting activity, Decreased activity tolerance, Decreased balance, Decreased endurance, Decreased mobility, Decreased knowledge of use of DME, Decreased range of motion, Decreased scar mobility, Decreased strength, Postural dysfunction, Prosthetic Dependency  Visit Diagnosis: Unsteadiness on feet  Other abnormalities of gait and mobility  Muscle weakness (generalized)  Abnormal posture  History of falling     Problem List Patient Active Problem List   Diagnosis Date Noted  . Non-ST elevation (NSTEMI) myocardial infarction ([place])   . Persistent atrial fibrillation with rapid ventricular response ([place]) 06/15/2018  . Chest pain with moderate risk for cardiac etiology 06/15/2018  . Atrial flutter ([employer]) 06/15/2018  . Somniloquy 06/12/2018  . Encounter for therapeutic drug monitoring 05/27/2018  . Chronic systolic heart failure ([place])   . Demand ischemia ([place])   . Atrial fibrillation with RVR ([place]) 05/02/2018  . Murmur, cardiac 02/13/2018  . Mallory-Weiss tear 01/16/2018  . Morbid obesity ([place])   . Fall   . Subtherapeutic international normalized ratio (INR)   . History of left below knee amputation ([place]) 12/11/2017  . History of supraventricular tachycardia   . S/P bilateral BKA (below knee amputation) ([place])   . Anemia of chronic disease   . Leukocytosis   . Non-ischemic cardiomyopathy ([place])   . Hypokalemia 09/04/2017  . Essential hypertension 09/03/2017  . GERD (gastroesophageal reflux disease) 09/03/2017  . GIB (gastrointestinal bleeding) 07/26/2017  . Anemia due to end stage renal disease ([place]) 07/26/2017  . PAF (paroxysmal atrial fibrillation) ([place]) 07/26/2017  . Symptomatic anemia  07/26/2017  . End stage renal disease ([place]) 05/05/2017  . HIV disease ([place]) 03/25/2017  . Hypertensive heart disease with end stage renal disease on dialysis ([place]) 03/13/2017  . ESRD on dialysis ([place]) 03/13/2017  . Type 2 diabetes mellitus with complication ([place]) 82/51/8984  . SVT (supraventricular tachycardia) ([place])     Ena Demary PT, DPT 08/11/2018, 5:04 PM  [facility] [address] [place], [place], [address] Phone: [phone]   Fax:  [phone]  Name: Thomas [name] MRN: [id number] Date of Birth: December 26, 1966

## 2018-08-12 ENCOUNTER — Ambulatory Visit: Payer: Medicare Other | Admitting: Physical Therapy

## 2018-08-12 ENCOUNTER — Encounter: Payer: Self-pay | Admitting: Physical Therapy

## 2018-08-12 DIAGNOSIS — R2689 Other abnormalities of gait and mobility: Secondary | ICD-10-CM

## 2018-08-12 DIAGNOSIS — R293 Abnormal posture: Secondary | ICD-10-CM

## 2018-08-12 DIAGNOSIS — M6281 Muscle weakness (generalized): Secondary | ICD-10-CM

## 2018-08-12 DIAGNOSIS — R2681 Unsteadiness on feet: Secondary | ICD-10-CM

## 2018-08-13 ENCOUNTER — Other Ambulatory Visit: Payer: Self-pay | Admitting: Internal Medicine

## 2018-08-14 NOTE — Therapy (Signed)
[facility] [address] [place], [place], [address] Phone: [phone]   Fax:  [phone]  Physical Therapy Treatment  Patient Details  Name: Thomas [name] MRN: [id number] Date of Birth: 04-14-1966 Referring Provider: Meridee Score, MD   Encounter Date: 08/12/2018     08/12/18 1817  PT Visits / Re-Eval  Visit Number 13  Number of Visits 26  Date for PT Re-Evaluation 08/14/18  Authorization  Authorization Type Medicare & Generic commercial  PT Time Calculation  PT Start Time 1230  PT Stop Time 1310  PT Time Calculation (min) 40 min  PT - End of Session  Equipment Utilized During Treatment Gait belt  Activity Tolerance Patient tolerated treatment well  Behavior During Therapy [facility] for tasks assessed/performed    Past Medical History:  Diagnosis Date  . Anemia   . Atrial fibrillation ([place])   . Chest pain with moderate risk for cardiac etiology 06/15/2018  . Chronic combined systolic and diastolic CHF (congestive heart failure) ([place])   . Diabetic foot ulcer ([place]) 09/03/2017  . Diabetic wet gangrene of the foot ([place]) 09/04/2017  . ESRD (end stage renal disease) on dialysis [facility])    [place] [address] [place], [place], [place] (06/15/2018)  . Headache    "monthly" (06/15/2018)  . HIV disease ([place])   . Hyperparathyroidism, secondary ([place])   . Hypertension   . Hypertensive heart disease with end stage renal disease on dialysis ([place]) 03/13/2017  . Pneumonia X 1  . Subacute osteomyelitis, right ankle and foot ([place])   . SVT (supraventricular tachycardia) ([place])    ? afib or atrial flutter s/p TEE/DCCV with subsequent ablation due to reoccurrence in [place]  . Type 2 diabetes mellitus ([place])     Past Surgical History:  Procedure Laterality Date  . AMPUTATION Left    foot  . AMPUTATION Right 12/06/2017   Procedure: AMPUTATION BELOW KNEE;  Surgeon: [name], MD;  Location: [place] [facility];  Service: Orthopedics;  Laterality: Right;  .  APPLICATION OF WOUND VAC Right 09/04/2017   Procedure: APPLICATION OF WOUND VAC;  Surgeon: [name], DPM;  Location: [facility];  Service: Podiatry;  Laterality: Right;  . APPLICATION OF WOUND VAC  12/06/2017   Procedure: APPLICATION OF WOUND VAC;  Surgeon: [name], MD;  Location: [facility];  Service: Orthopedics;;  . AV FISTULA PLACEMENT Right 10/19/2012  . AV FISTULA REPAIR Right ~ 02/2018   "had it cleaned out"  . BELOW KNEE LEG AMPUTATION Left ~ 2016  . COLONOSCOPY WITH PROPOFOL N/A 07/30/2017   Procedure: COLONOSCOPY WITH PROPOFOL;  Surgeon: [name], MD;  Location: [place] [facility];  Service: Endoscopy;  Laterality: N/A;  . ESOPHAGOGASTRODUODENOSCOPY (EGD) WITH PROPOFOL N/A 07/28/2017   Procedure: ESOPHAGOGASTRODUODENOSCOPY (EGD) WITH PROPOFOL;  Surgeon: [name], MD;  Location: [facility];  Service: Endoscopy;  Laterality: N/A;  . ESOPHAGOGASTRODUODENOSCOPY (EGD) WITH PROPOFOL N/A 01/15/2018   Procedure: ESOPHAGOGASTRODUODENOSCOPY (EGD) WITH PROPOFOL;  Surgeon: [name], MD;  Location: [facility] ENDOSCOPY;  Service: Endoscopy;  Laterality: N/A;  . FLEXIBLE SIGMOIDOSCOPY N/A 07/27/2017   Procedure: FLEXIBLE SIGMOIDOSCOPY;  Surgeon: [name], MD;  Location: [facility] ENDOSCOPY;  Service: Endoscopy;  Laterality: N/A;  . GRAFT APPLICATION Right 56/31/4970   Procedure: SKIN GRAFT APPLICATION RIGHT FOOT;  Surgeon: [name], DPM;  Location: [facility];  Service: Podiatry;  Laterality: Right;  . I&D EXTREMITY Right 09/04/2017   Procedure: IRRIGATION AND DEBRIDEMENT EXTREMITY;  Surgeon: [name], DPM;  Location: [facility]  OR;  Service: Podiatry;  Laterality: Right;  . I&D EXTREMITY Right 11/12/2017   Procedure: IRRIGATION AND DEBRIDEMENT ULCER RIGHT FOOT;  Surgeon: [name], DPM;  Location: [place];  Service: Podiatry;  Laterality: Right;  . LEFT HEART CATH AND CORONARY ANGIOGRAPHY N/A 06/18/2018   Procedure: LEFT HEART CATH AND CORONARY ANGIOGRAPHY;  Surgeon: [name], Peter M, MD;   Location: [facility] CV LAB;  Service: Cardiovascular;  Laterality: N/A;  . WOUND DEBRIDEMENT N/A 09/24/2017   Procedure: DEBRIDEMENT WOUND;  Surgeon: [name], DPM;  Location: [place];  Service: Podiatry;  Laterality: N/A;    There were no vitals filed for this visit.     08/12/18 1229  Symptoms/Limitations  Subjective He feels that he has made progress but could improve more with additional PT.   Pertinent History R TTA, ESRD, DM2, HIV, cardiomyopathy, CHF, Tachycardia, A-Fib, Mallory-Weiss Tear, obesity  Limitations Standing;Walking;House hold activities  Patient Stated Goals To use bilateral prostheses to dance with wife & swimming,   Pain Assessment  Currently in Pain? No/denies          08/12/18 1230  Posture/Postural Control  Posture/Postural Control Postural limitations  Postural Limitations Rounded Shoulders;Forward head;Flexed trunk;Weight shift left (wide stance)  Transfers  Sit to Stand 5: Supervision;6: Modified independent (Device/Increase time);With upper extremity assist;With armrests;From chair/3-in-1 ( Mod Indep. from chairs w/ armrests & SBA chairs no armrests)  Stand to Sit 6: Modified independent (Device/Increase time);5: Supervision;With upper extremity assist;With armrests;To chair/3-in-1 (Mod Indep. from chairs w/ armrests & SBA chairs no armrests)  Ambulation/Gait  Ambulation/Gait Yes  Ambulation/Gait Assistance 5: Supervision  Ambulation/Gait Assistance Details carrying plate of objects around furniture  Ambulation Distance (Feet) 100 Feet (100' no device & 792' cane)  Assistive device Prostheses;None;Straight cane  Gait Pattern Step-through pattern;Trunk flexed;Wide base of support  Ambulation Surface Indoor;Level;Outdoor;Paved;Grass  Stairs Yes  Stairs Assistance 5: Supervision  Stair Management Technique Two rails;Alternating pattern;One rail Right;Step to pattern;Forwards  Number of Stairs 4  Ramp 5: Supervision (cane & bil TTA  prostheses)  Curb 4: Min assist (cane & bil TTA prostheses)  6 Minute Walk- Baseline  6 Minute Walk- Baseline y  BP (mmHg) (!) 149/122  HR (bpm) 77  02 Sat (%RA) 97 %  6 Minute walk- Post Test  BP (mmHg) 143/71  HR (bpm) 93  02 Sat (%RA) 92 %  Modified Borg Scale for Dyspnea 5- Strong or hard breathing  Perceived Rate of Exertion (Borg) 13- Somewhat hard  6 minute walk test results   Aerobic Endurance Distance Walked 792 (pace initial minute 2.2f/sec & last minute 1.5 ft/sec)  Endurance additional comments stopped at 563m 35sec;   Static Standing Balance  Static Standing - Balance Support No upper extremity supported  Static Standing - Level of Assistance 5: Stand by assistance  Static Standing - Comment/# of Minutes 2 minutes  Dynamic Standing Balance  Dynamic Standing - Balance Support Left upper extremity supported (cane support)  Dynamic Standing - Level of Assistance 5: Stand by assistance  Dynamic Standing - Balance Activities Head nods;Head turns;Reaching for objects  Dynamic Standing - Comments reaches 7" anteriorly, within 5" of floor, scans with upper trunk rotation to look side to side & up/down.       08/12/18 1230  Transfers  Sit to Stand 5: Supervision;6: Modified independent (Device/Increase time);With upper extremity assist;With armrests;From chair/3-in-1 ( Mod Indep. from chairs w/ armrests & SBA chairs no armrests)  Stand to Sit 6: Modified independent (Device/Increase time);5:  Supervision;With upper extremity assist;With armrests;To chair/3-in-1 (Mod Indep. from chairs w/ armrests & SBA chairs no armrests)  Ambulation/Gait  Ambulation/Gait Yes  Ambulation/Gait Assistance 5: Supervision  Ambulation/Gait Assistance Details carrying plate of objects around furniture  Ambulation Distance (Feet) 100 Feet (100' no device & 792' cane)  Assistive device Prostheses;None;Straight cane  Gait Pattern Step-through pattern;Trunk flexed;Wide base of support   Ambulation Surface Indoor;Level;Outdoor;Paved;Grass  Stairs Yes  Stairs Assistance 5: Supervision  Stair Management Technique Two rails;Alternating pattern;One rail Right;Step to pattern;Forwards  Number of Stairs 4  Ramp 5: Supervision (cane & bil TTA prostheses)  Curb 4: Min assist (cane & bil TTA prostheses)  Posture/Postural Control  Posture/Postural Control Postural limitations  Postural Limitations Rounded Shoulders;Forward head;Flexed trunk;Weight shift left (wide stance)  Prosthetics  Current prosthetic wear tolerance (days/week)  daily  Current prosthetic wear tolerance (#hours/day)  most of awake hours including dialysis, removes for naps after dialysis  Current prosthetic weight-bearing tolerance (hours/day)  Patient tolerates 10 minutes of standing acitivities without limb pain.  Residual limb condition  no open areas, normal color, temperature, moisture & hair growth. cylinderical shape.        08/12/18 1230  Prosthetics  Prosthetic Care Independent with Skin check;Residual limb care;Prosthetic cleaning;Proper wear schedule/adjustment;Ply sock cleaning  Prosthetic Care Dependent with Correct ply sock adjustment;Proper weight-bearing schedule/adjustment  Donning prosthesis  6  Doffing prosthesis  6  Current prosthetic wear tolerance (days/week)  daily  Current prosthetic wear tolerance (#hours/day)  most of awake hours including dialysis, removes for naps after dialysis  Current prosthetic weight-bearing tolerance (hours/day)  Patient tolerates 10 minutes of standing acitivities without limb pain.  Residual limb condition  no open areas, normal color, temperature, moisture & hair growth. cylinderical shape.      08/12/18 1230  PT Education  Education provided Yes  Education Details set-up of aerobic step near counter or table with chair backs on opposite side: step-ups & step downs alternating lead limb  Person(s) Educated Patient  Methods  Explanation;Demonstration;Tactile cues;Verbal cues  Comprehension Verbalized understanding;Returned demonstration;Verbal cues required;Tactile cues required;Need further instruction              08/12/18 1852  Plan  Clinical Impression Statement Patient fully met 2 LTGs, partially MET 3 LTGs (progressed but lacks full level of function) and NOT MET 2 LTGs (requires more assistance). Patient would benefit from additional PT to fully meet his functional potential. Patient has potential to ambulate household & very limited community with prostheses only and fuller community including grass & architectual barriers with cane & Prostheses.  Pt will benefit from skilled therapeutic intervention in order to improve on the following deficits Abnormal gait;Cardiopulmonary status limiting activity;Decreased activity tolerance;Decreased balance;Decreased endurance;Decreased mobility;Decreased knowledge of use of DME;Decreased range of motion;Decreased scar mobility;Decreased strength;Postural dysfunction;Prosthetic Dependency  Rehab Potential Good  PT Frequency 1x / week  PT Duration 12 weeks  PT Treatment/Interventions ADLs/Self Care Home Management;Canalith Repostioning;DME Instruction;Gait training;Stair training;Functional mobility training;Therapeutic activities;Therapeutic exercise;Balance training;Patient/family education;Prosthetic Training;Vestibular;Manual techniques  PT Next Visit Plan instruct in activities to progress mobility, strength, endurance & balance at home or outside PT  Consulted and Agree with Plan of Care Patient               PT Short Term Goals - 08/12/18 1847      PT SHORT TERM GOAL #1   Title  Patient verbalizes proper adjustment of ply socks with limb volume changes. (All STGs Target Date: 09/11/2018)    Baseline  Time  4    Period  Weeks    Status  New    Target Date  09/11/18      PT SHORT TERM GOAL #2   Title  Patient ambulates 200' negotiating  around obstacles carrying plate of objects safely with verbal cues.     Baseline        Time  4    Period  Weeks    Status  New    Target Date  09/11/18      PT SHORT TERM GOAL #3   Title  Patient reaches to floor with cane to retrieve 3# object with supervision.    Baseline        Time  4    Period  Weeks    Status  New    Target Date  09/11/18      PT SHORT TERM GOAL #4   Title  Patient ambulates 750' with cane outdoors including 100' on grass with supervision.     Baseline        Time  4    Period  Weeks    Status  New    Target Date  09/11/18      PT SHORT TERM GOAL #5   Title  Patient negotiates curb with cane & prostheses with supervision.    Time  4    Period  Weeks    Status  New    Target Date  09/11/18        PT Long Term Goals - 08/12/18 1837      PT LONG TERM GOAL #1   Title  Patient verbalizes & demonstrates proper prosthetic care including prostheses with dialysis to enable safe use of bilateral Transtibial prostheses. (All LTGs Target Date: 08/14/2018)    Baseline  Partially MET 08/12/2018 Pt is independent with donning, residual limb care and cleaning prostheses. He needs cues for adjusting ply socks, adjusting weight bearing and advanced problem solving new issues.     Time  3    Period  Months    Status  Partially Met      PT LONG TERM GOAL #2   Title  Patient tolerates wear of bilateral Transtibial prostheses >90% of awake hours without skin issues or limb pain to enable function during most of his day.     Baseline  MET 08/12/2018    Time  3    Period  Months    Status  Achieved      PT LONG TERM GOAL #3   Title  Standing balance with cane or less support reaching 10" anteriorly, to floor, scanning & managing clothes modified independent.     Baseline  Partially MET 08/12/2018 with cane. He reaches 7" anteriorly & within 5" of floor, scans & manages clothes safely.     Time  3    Period  Months    Status  Partially Met      PT LONG TERM GOAL #4    Title  Patient ambulates 500' outdoors including grass with cane or less & bilateral prostheses modified independent for community mobility.     Baseline  Partially MET 08/12/2018 Patient ambulates >500' on indoor & paved surfaces with cane modified independent. He needs minA on grass.    Time  3    Period  Months    Status  Partially Met      PT LONG TERM GOAL #5   Title  Patient ambulates100'  around furniture carrying plate  or cup with cane or less & prostheses modified independent for household mobility.     Baseline  NOT MET 08/12/2018  Patient ambulates 100' around furniture carrying plate with supervision.     Time  3    Period  Months    Status  Not Met      PT LONG TERM GOAL #6   Title  Patient negotiates ramps, curbs & stairs (1 rail) with cane or less & prostheses modified independent for community access.     Baseline  NOT MET 08/12/2018  Patient requires supervision to negotiate ramps & minimal assist for curbs with cane support.     Time  3    Period  Months    Status  Not Met      PT LONG TERM GOAL #7   Title  Pt will improve endurance during gait by increasing distance on 6 minute walk test by 150' with LRAD    Baseline  MET 08/12/2018  6-Minute Walk Test with cane 792'    Time  3    Period  Months    Status  Achieved      PT Long Term Goals - 08/14/18 1859      PT LONG TERM GOAL #1   Title  Patient verbalizes & demonstrates proper prosthetic care including problem solving advanced issues to enable safe use of bilateral Transtibial prostheses. (All LTGs Target Date: 08/14/2018)    Baseline        Time  12    Period  Weeks    Status  New    Target Date  11/06/18      PT LONG TERM GOAL #2   Title  Patient tolerates wear of bilateral Transtibial prostheses >90% of awake hours without skin issues or limb pain to enable function during most of his day.     Baseline        Time  12    Period  Weeks    Status  On-going    Target Date  11/06/18      PT LONG TERM  GOAL #3   Title  Berg Balance >36/56    Baseline        Time  12    Period  Weeks    Status  New    Target Date  11/06/18      PT LONG TERM GOAL #4   Title  Patient ambulates 1000' outdoors including grass with cane or less & bilateral prostheses modified independent for community mobility.     Baseline        Time  12    Period  Weeks    Status  New    Target Date  11/06/18      PT LONG TERM GOAL #5   Title  Patient ambulates 200' around furniture carrying plate or cup with prostheses only modified independent for household mobility.     Baseline       Time  12    Period  Weeks    Status  Revised    Target Date  11/06/18      PT LONG TERM GOAL #6   Title  Patient negotiates ramps, curbs & stairs (1 rail) with cane or less & prostheses modified independent for community access.     Baseline        Time  12    Period  Weeks    Status  On-going    Target Date  11/06/18  PT LONG TERM GOAL #7   Title  6 Minute Walk Test with cane & prostheses >900'    Baseline       Time  12    Period  Weeks    Status  Revised    Target Date  11/06/18           Patient will benefit from skilled therapeutic intervention in order to improve the following deficits and impairments:     Visit Diagnosis: Unsteadiness on feet  Other abnormalities of gait and mobility  Muscle weakness (generalized)  Abnormal posture     Problem List Patient Active Problem List   Diagnosis Date Noted  . Non-ST elevation (NSTEMI) myocardial infarction ([place])   . Persistent atrial fibrillation with rapid ventricular response ([place]) 06/15/2018  . Chest pain with moderate risk for cardiac etiology 06/15/2018  . Atrial flutter ([place]) 06/15/2018  . Somniloquy 06/12/2018  . Encounter for therapeutic drug monitoring 05/27/2018  . Chronic systolic heart failure ([place])   . Demand ischemia ([place])   . Atrial fibrillation with RVR ([place]) 05/02/2018  . Murmur, cardiac 02/13/2018  . Mallory-Weiss tear  01/16/2018  . Morbid obesity ([place])   . Fall   . Subtherapeutic international normalized ratio (INR)   . History of left below knee amputation ([place]) 12/11/2017  . History of supraventricular tachycardia   . S/P bilateral BKA (below knee amputation) ([place])   . Anemia of chronic disease   . Leukocytosis   . Non-ischemic cardiomyopathy ([place])   . Hypokalemia 09/04/2017  . Essential hypertension 09/03/2017  . GERD (gastroesophageal reflux disease) 09/03/2017  . GIB (gastrointestinal bleeding) 07/26/2017  . Anemia due to end stage renal disease ([place]) 07/26/2017  . PAF (paroxysmal atrial fibrillation) ([place]) 07/26/2017  . Symptomatic anemia 07/26/2017  . End stage renal disease ([place]) 05/05/2017  . HIV disease ([place]) 03/25/2017  . Hypertensive heart disease with end stage renal disease on dialysis ([place]) 03/13/2017  . ESRD on dialysis ([place]) 03/13/2017  . Type 2 diabetes mellitus with complication ([place]) 79/02/8332  . SVT (supraventricular tachycardia) ([place])     Danelle Curiale PT, DPT 08/14/2018, 6:45 PM  [facility] [address] [place], [place], [address] Phone: [phone]   Fax:  [phone]  Name: Thomas [name] MRN: [id number] Date of Birth: July 10, 1966

## 2018-08-18 ENCOUNTER — Other Ambulatory Visit: Payer: Self-pay | Admitting: Internal Medicine

## 2018-08-19 ENCOUNTER — Encounter: Payer: Self-pay | Admitting: Physical Therapy

## 2018-08-19 ENCOUNTER — Ambulatory Visit: Payer: Medicare Other | Admitting: Physical Therapy

## 2018-08-19 DIAGNOSIS — M6281 Muscle weakness (generalized): Secondary | ICD-10-CM

## 2018-08-19 DIAGNOSIS — R2681 Unsteadiness on feet: Secondary | ICD-10-CM | POA: Diagnosis not present

## 2018-08-19 DIAGNOSIS — R293 Abnormal posture: Secondary | ICD-10-CM

## 2018-08-19 DIAGNOSIS — R2689 Other abnormalities of gait and mobility: Secondary | ICD-10-CM

## 2018-08-20 NOTE — Therapy (Signed)
[facility] [address] [place], [place], [address] Phone: [phone]   Fax:  [phone]  Physical Therapy Treatment  Patient Details  Name: Thomas [name] MRN: [id number] Date of Birth: 1966-08-28 Referring Provider: Meridee Score, MD   Encounter Date: 08/19/2018  PT End of Session - 08/19/18 1417    Visit Number  14    Number of Visits  26    Date for PT Re-Evaluation  08/14/18    Authorization Type  Medicare & Generic commercial    PT Start Time  1319    PT Stop Time  1405    PT Time Calculation (min)  46 min    Equipment Utilized During Treatment  Gait belt    Activity Tolerance  Patient tolerated treatment well    Behavior During Therapy  WFL for tasks assessed/performed       Past Medical History:  Diagnosis Date  . Anemia   . Atrial fibrillation ([place])   . Chest pain with moderate risk for cardiac etiology 06/15/2018  . Chronic combined systolic and diastolic CHF (congestive heart failure) ([place])   . Diabetic foot ulcer ([place]) 09/03/2017  . Diabetic wet gangrene of the foot ([place]) 09/04/2017  . ESRD (end stage renal disease) on dialysis [facility])    [place] [address], [place], [place] (06/15/2018)  . Headache    "monthly" (06/15/2018)  . HIV disease ([place])   . Hyperparathyroidism, secondary ([place])   . Hypertension   . Hypertensive heart disease with end stage renal disease on dialysis ([place]) 03/13/2017  . Pneumonia X 1  . Subacute osteomyelitis, right ankle and foot ([place])   . SVT (supraventricular tachycardia) ([place])    ? afib or atrial flutter s/p TEE/DCCV with subsequent ablation due to reoccurrence in [place]  . Type 2 diabetes mellitus ([place])     Past Surgical History:  Procedure Laterality Date  . AMPUTATION Left    foot  . AMPUTATION Right 12/06/2017   Procedure: AMPUTATION BELOW KNEE;  Surgeon: [name], MD;  Location: [facility];  Service: Orthopedics;  Laterality: Right;  . APPLICATION OF WOUND VAC Right 09/04/2017    Procedure: APPLICATION OF WOUND VAC;  Surgeon: [name], DPM;  Location: [facility];  Service: Podiatry;  Laterality: Right;  . APPLICATION OF WOUND VAC  12/06/2017   Procedure: APPLICATION OF WOUND VAC;  Surgeon: [name], MD;  Location: [facility];  Service: Orthopedics;;  . AV FISTULA PLACEMENT Right 10/19/2012  . AV FISTULA REPAIR Right ~ 02/2018   "had it cleaned out"  . BELOW KNEE LEG AMPUTATION Left ~ 2016  . COLONOSCOPY WITH PROPOFOL N/A 07/30/2017   Procedure: COLONOSCOPY WITH PROPOFOL;  Surgeon: [name], MD;  Location: [facility];  Service: Endoscopy;  Laterality: N/A;  . ESOPHAGOGASTRODUODENOSCOPY (EGD) WITH PROPOFOL N/A 07/28/2017   Procedure: ESOPHAGOGASTRODUODENOSCOPY (EGD) WITH PROPOFOL;  Surgeon: [name], MD;  Location: [place];  Service: Endoscopy;  Laterality: N/A;  . ESOPHAGOGASTRODUODENOSCOPY (EGD) WITH PROPOFOL N/A 01/15/2018   Procedure: ESOPHAGOGASTRODUODENOSCOPY (EGD) WITH PROPOFOL;  Surgeon: [name], MD;  Location: [facility] ENDOSCOPY;  Service: Endoscopy;  Laterality: N/A;  . FLEXIBLE SIGMOIDOSCOPY N/A 07/27/2017   Procedure: FLEXIBLE SIGMOIDOSCOPY;  Surgeon: [name], MD;  Location: [facility] ENDOSCOPY;  Service: Endoscopy;  Laterality: N/A;  . GRAFT APPLICATION Right 09/73/5329   Procedure: SKIN GRAFT APPLICATION RIGHT FOOT;  Surgeon: [name], DPM;  Location: [facility];  Service: Podiatry;  Laterality: Right;  . I&D EXTREMITY  Right 09/04/2017   Procedure: IRRIGATION AND DEBRIDEMENT EXTREMITY;  Surgeon: [name], DPM;  Location: [facility];  Service: Podiatry;  Laterality: Right;  . I&D EXTREMITY Right 11/12/2017   Procedure: IRRIGATION AND DEBRIDEMENT ULCER RIGHT FOOT;  Surgeon: [name], DPM;  Location: [facility];  Service: Podiatry;  Laterality: Right;  . LEFT HEART CATH AND CORONARY ANGIOGRAPHY N/A 06/18/2018   Procedure: LEFT HEART CATH AND CORONARY ANGIOGRAPHY;  Surgeon: [name], Peter M, MD;  Location: [facility];   Service: Cardiovascular;  Laterality: N/A;  . WOUND DEBRIDEMENT N/A 09/24/2017   Procedure: DEBRIDEMENT WOUND;  Surgeon: [name], DPM;  Location: [facility];  Service: Podiatry;  Laterality: N/A;    There were no vitals filed for this visit.  Subjective Assessment - 08/19/18 1315    Subjective  No falls. [place] walked at airport and did a lot of walking    Pertinent History  R TTA, ESRD, DM2, HIV, cardiomyopathy, CHF, Tachycardia, A-Fib, Mallory-Weiss Tear, obesity    Limitations  Standing;Walking;House hold activities    Patient Stated Goals  To use bilateral prostheses to dance with wife & swimming,     Currently in Pain?  No/denies         Prosthetic Training with bilateral Transtibial Amputations PT used parked car with front over edge of curb to enable UE assist. Pt negotiated curb with cane & light touch on car with supervision & cues on technique.  PT demo, verbal cues on technique for floor transfer pressing on mat table. Pt return demo understanding leading with both LEs. PT recommended adding to HEP as strength & function  Therapeutic Exercise PT added for HEP exercises seated on 24" bar stool using green theraband with handles reciprocal & BUE Sit to Stand - 10 reps  Chest Press with Resistance - 10 reps Alternating Punch with Resistance - 10 reps  Shoulder extension with resistance - Neutral - 10 reps  Standing Shoulder Horizontal Abduction with Resistance - 10 reps  Standing Row with Resistance with Anchored Resistance at Chest Height Palms Down - 10 reps  Single Arm Shoulder Extension with Resistance - 10 reps  Scaption with Resistance - 10 reps  Seated Shoulder Diagonal Pulls with Resistance - 10 reps  Standing Military Press with Resistance - 10 reps  Standing Single Arm Shoulder Abduction with Resistance - 10 reps  Shoulder Adduction Bilateral resistance - 10 reps  Seated Shoulder Scaption with Resistance - 10 reps  Seated Shoulder Incline press with Resistance  10  reps                        PT Education - 08/19/18 1350    Education provided  Yes    Education Details  Access Code: [email or web address]  floor transfers using horizontal surface to push-up, sit to/from stand from 24" stool without UE assist, seated on 24" stool (no back support, feet supported on floor) UE green resistance band exercises reciprocal & BUEs row, forward reach, biceps curls, overhead press.      Person(s) Educated  Patient    Methods  Explanation;Demonstration;Tactile cues;Verbal cues;Handout    Comprehension  Verbalized understanding;Returned demonstration;Verbal cues required;Tactile cues required;Need further instruction       PT Short Term Goals - 08/12/18 1847      PT SHORT TERM GOAL #1   Title  Patient verbalizes proper adjustment of ply socks with limb volume changes. (All STGs Target Date: 09/11/2018)    Baseline  Time  4    Period  Weeks    Status  New    Target Date  09/11/18      PT SHORT TERM GOAL #2   Title  Patient ambulates 200' negotiating around obstacles carrying plate of objects safely with verbal cues.     Baseline        Time  4    Period  Weeks    Status  New    Target Date  09/11/18      PT SHORT TERM GOAL #3   Title  Patient reaches to floor with cane to retrieve 3# object with supervision.    Baseline        Time  4    Period  Weeks    Status  New    Target Date  09/11/18      PT SHORT TERM GOAL #4   Title  Patient ambulates 750' with cane outdoors including 100' on grass with supervision.     Baseline        Time  4    Period  Weeks    Status  New    Target Date  09/11/18      PT SHORT TERM GOAL #5   Title  Patient negotiates curb with cane & prostheses with supervision.    Time  4    Period  Weeks    Status  New    Target Date  09/11/18        PT Long Term Goals - 08/14/18 1859      PT LONG TERM GOAL #1   Title  Patient verbalizes & demonstrates proper prosthetic care including problem solving  advanced issues to enable safe use of bilateral Transtibial prostheses. (All LTGs Target Date: 08/14/2018)    Baseline        Time  12    Period  Weeks    Status  New    Target Date  11/06/18      PT LONG TERM GOAL #2   Title  Patient tolerates wear of bilateral Transtibial prostheses >90% of awake hours without skin issues or limb pain to enable function during most of his day.     Baseline        Time  12    Period  Weeks    Status  On-going    Target Date  11/06/18      PT LONG TERM GOAL #3   Title  Berg Balance >36/56    Baseline        Time  12    Period  Weeks    Status  New    Target Date  11/06/18      PT LONG TERM GOAL #4   Title  Patient ambulates 1000' outdoors including grass with cane or less & bilateral prostheses modified independent for community mobility.     Baseline        Time  12    Period  Weeks    Status  New    Target Date  11/06/18      PT LONG TERM GOAL #5   Title  Patient ambulates 200' around furniture carrying plate or cup with prostheses only modified independent for household mobility.     Baseline       Time  12    Period  Weeks    Status  Revised    Target Date  11/06/18      PT LONG TERM GOAL #  6   Title  Patient negotiates ramps, curbs & stairs (1 rail) with cane or less & prostheses modified independent for community access.     Baseline        Time  12    Period  Weeks    Status  On-going    Target Date  11/06/18      PT LONG TERM GOAL #7   Title  6 Minute Walk Test with cane & prostheses >900'    Baseline       Time  12    Period  Weeks    Status  Revised    Target Date  11/06/18            Plan - 08/19/18 1800    Clinical Impression Statement  Today's skilled session focused on activities for home for strength/core for balance training. Pt was challenged by exercises which he seemed to appreciate.     Rehab Potential  Good    PT Frequency  1x / week    PT Duration  12 weeks    PT Treatment/Interventions  ADLs/Self  Care Home Management;Canalith Repostioning;DME Instruction;Gait training;Stair training;Functional mobility training;Therapeutic activities;Therapeutic exercise;Balance training;Patient/family education;Prosthetic Training;Vestibular;Manual techniques    PT Next Visit Plan  review exercises (floor transfers, sit to/from stand stool) & UE green resistance band exercises.  introduce seated on Swiss ball    PT Home Exercise Plan  MedBridge Access Code: [email or web address]    Consulted and Agree with Plan of Care  Patient       Patient will benefit from skilled therapeutic intervention in order to improve the following deficits and impairments:  Abnormal gait, Cardiopulmonary status limiting activity, Decreased activity tolerance, Decreased balance, Decreased endurance, Decreased mobility, Decreased knowledge of use of DME, Decreased range of motion, Decreased scar mobility, Decreased strength, Postural dysfunction, Prosthetic Dependency  Visit Diagnosis: Unsteadiness on feet  Other abnormalities of gait and mobility  Muscle weakness (generalized)  Abnormal posture     Problem List Patient Active Problem List   Diagnosis Date Noted  . Non-ST elevation (NSTEMI) myocardial infarction ([place])   . Persistent atrial fibrillation with rapid ventricular response ([place]) 06/15/2018  . Chest pain with moderate risk for cardiac etiology 06/15/2018  . Atrial flutter ([place]) 06/15/2018  . Somniloquy 06/12/2018  . Encounter for therapeutic drug monitoring 05/27/2018  . Chronic systolic heart failure ([place])   . Demand ischemia ([place])   . Atrial fibrillation with RVR ([place]) 05/02/2018  . Murmur, cardiac 02/13/2018  . Mallory-Weiss tear 01/16/2018  . Morbid obesity ([place])   . Fall   . Subtherapeutic international normalized ratio (INR)   . History of left below knee amputation ([place]) 12/11/2017  . History of supraventricular tachycardia   . S/P bilateral BKA (below knee amputation) ([place])   . Anemia of chronic disease    . Leukocytosis   . Non-ischemic cardiomyopathy ([place])   . Hypokalemia 09/04/2017  . Essential hypertension 09/03/2017  . GERD (gastroesophageal reflux disease) 09/03/2017  . GIB (gastrointestinal bleeding) 07/26/2017  . Anemia due to end stage renal disease ([place]) 07/26/2017  . PAF (paroxysmal atrial fibrillation) ([place]) 07/26/2017  . Symptomatic anemia 07/26/2017  . End stage renal disease ([place]) 05/05/2017  . HIV disease ([place]) 03/25/2017  . Hypertensive heart disease with end stage renal disease on dialysis ([place]) 03/13/2017  . ESRD on dialysis ([place]) 03/13/2017  . Type 2 diabetes mellitus with complication ([place]) 35/45/6256  . SVT (supraventricular tachycardia) ([place])     Voula Waln PT, DPT 08/20/2018, 7:52  AM  [facility] [address] [place], [place], [address] Phone: [phone]   Fax:  [phone]  Name: Jeremy Ditullio MRN: [id number] Date of Birth: 1966-04-27

## 2018-08-20 NOTE — Patient Instructions (Signed)
Access Code: [email or web address]  URL: [email or web address]  Date: 08/19/2018  Prepared by: Jamey Reas   Exercises Half-Kneeling to Standing - 10 reps - 1 sets - 5 seconds hold - 1x daily - 5x weekly Sit to Stand - 10 reps - 1 sets - 5 seconds hold - 1x daily - 5x weekly Chest Press with Resistance - 10 reps - 1 sets - 5 seconds hold - 1x daily - 5x weekly Alternating Punch with Resistance - 10 reps - 1 sets - 5 seconds hold - 1x daily - 5x weekly Shoulder extension with resistance - Neutral - 10 reps - 1 sets - 5 seconds hold - 1x daily - 5x weekly Standing Shoulder Horizontal Abduction with Resistance - 10 reps - 1 sets - 5 seconds hold - 1x daily - 5x weekly Standing Row with Resistance with Anchored Resistance at Chest Height Palms Down - 10 reps - 1 sets - 5 seconds hold - 1x daily - 5x weekly Single Arm Shoulder Extension with Resistance - 10 reps - 1 sets - 5 seconds hold - 1x daily - 5x weekly Scaption with Resistance - 10 reps - 1 sets - 5 seconds hold - 1x daily - 5x weekly Seated Shoulder Diagonal Pulls with Resistance - 10 reps - 1 sets - 5 seconds hold - 1x daily - 5x weekly Standing Military Press with Resistance - 10 reps - 1 sets - 5 seconds hold - 1x daily - 5x weekly Standing Single Arm Shoulder Abduction with Resistance - 10 reps - 1 sets - 5 seconds hold - 1x daily - 5x weekly Shoulder Adduction Bilateral resistance - 10 reps - 1 sets - 5 seconds hold - 1x daily - 5x weekly Seated Shoulder Scaption with Resistance - 10 reps - 1 sets - 5 seconds hold - 1x daily - 5x weekly Seated Shoulder Incline press with Resistance - 10 reps - 1 sets - 5 seconds hold - 1x daily - 5x weekly

## 2018-08-24 MED FILL — BIKTARVY 50-200-25 MG TABS: 50-200-25 | 30 days supply | Qty: 30 | Fill #6

## 2018-08-27 ENCOUNTER — Encounter: Payer: Medicare Other | Admitting: Physical Therapy

## 2018-08-28 ENCOUNTER — Ambulatory Visit: Payer: Medicare Other | Admitting: Physical Therapy

## 2018-08-28 ENCOUNTER — Encounter: Payer: Self-pay | Admitting: Physical Therapy

## 2018-08-28 DIAGNOSIS — M6281 Muscle weakness (generalized): Secondary | ICD-10-CM

## 2018-08-28 DIAGNOSIS — R2689 Other abnormalities of gait and mobility: Secondary | ICD-10-CM

## 2018-08-28 DIAGNOSIS — R2681 Unsteadiness on feet: Secondary | ICD-10-CM | POA: Diagnosis not present

## 2018-08-28 DIAGNOSIS — R293 Abnormal posture: Secondary | ICD-10-CM

## 2018-08-29 NOTE — Therapy (Signed)
[facility] [address] [place], [place], [address] Phone: [phone]   Fax:  [phone]  Physical Therapy Treatment  Patient Details  Name: Thomas [name] MRN: [id number] Date of Birth: [birth year] Referring Provider: Meridee Score, MD   Encounter Date: 08/28/2018  PT End of Session - 08/28/18 1150    Visit Number  15    Number of Visits  26    Date for PT Re-Evaluation  08/14/18    Authorization Type  Medicare & Generic commercial    PT Start Time  1148    PT Stop Time  1228    PT Time Calculation (min)  40 min    Equipment Utilized During Treatment  Gait belt    Activity Tolerance  Patient tolerated treatment well    Behavior During Therapy  [facility] for tasks assessed/performed       Past Medical History:  Diagnosis Date  . Anemia   . Atrial fibrillation ([place])   . Chest pain with moderate risk for cardiac etiology 06/15/2018  . Chronic combined systolic and diastolic CHF (congestive heart failure) ([place])   . Diabetic foot ulcer ([place]) 09/03/2017  . Diabetic wet gangrene of the foot ([place]) 09/04/2017  . ESRD (end stage renal disease) on dialysis [facility])    [facility] [address] [place], [place] (06/15/2018)  . Headache    "monthly" (06/15/2018)  . HIV disease ([place])   . Hyperparathyroidism, secondary ([place])   . Hypertension   . Hypertensive heart disease with end stage renal disease on dialysis ([place]) 03/13/2017  . Pneumonia X 1  . Subacute osteomyelitis, right ankle and foot ([place])   . SVT (supraventricular tachycardia) ([place])    ? afib or atrial flutter s/p TEE/DCCV with subsequent ablation due to reoccurrence in [place]  . Type 2 diabetes mellitus ([place])     Past Surgical History:  Procedure Laterality Date  . AMPUTATION Left    foot  . AMPUTATION Right 12/06/2017   Procedure: AMPUTATION BELOW KNEE;  Surgeon: [name], MD;  Location: [place];  Service: Orthopedics;  Laterality: Right;  . APPLICATION OF WOUND VAC Right 09/04/2017    Procedure: APPLICATION OF WOUND VAC;  Surgeon: [name], DPM;  Location: [place];  Service: Podiatry;  Laterality: Right;  . APPLICATION OF WOUND VAC  12/06/2017   Procedure: APPLICATION OF WOUND VAC;  Surgeon: [name], MD;  Location: [place];  Service: Orthopedics;;  . AV FISTULA PLACEMENT Right 10/19/2012  . AV FISTULA REPAIR Right ~ 02/2018   "had it cleaned out"  . BELOW KNEE LEG AMPUTATION Left ~ 2016  . COLONOSCOPY WITH PROPOFOL N/A 07/30/2017   Procedure: COLONOSCOPY WITH PROPOFOL;  Surgeon: [name], MD;  Location: [facility];  Service: Endoscopy;  Laterality: N/A;  . ESOPHAGOGASTRODUODENOSCOPY (EGD) WITH PROPOFOL N/A 07/28/2017   Procedure: ESOPHAGOGASTRODUODENOSCOPY (EGD) WITH PROPOFOL;  Surgeon: [name], MD;  Location: [facility];  Service: Endoscopy;  Laterality: N/A;  . ESOPHAGOGASTRODUODENOSCOPY (EGD) WITH PROPOFOL N/A 01/15/2018   Procedure: ESOPHAGOGASTRODUODENOSCOPY (EGD) WITH PROPOFOL;  Surgeon: [name], MD;  Location: [facility] ENDOSCOPY;  Service: Endoscopy;  Laterality: N/A;  . FLEXIBLE SIGMOIDOSCOPY N/A 07/27/2017   Procedure: FLEXIBLE SIGMOIDOSCOPY;  Surgeon: [name], MD;  Location: [facility] ENDOSCOPY;  Service: Endoscopy;  Laterality: N/A;  . GRAFT APPLICATION Right 54/65/0354   Procedure: SKIN GRAFT APPLICATION RIGHT FOOT;  Surgeon: [name], DPM;  Location: [facility];  Service: Podiatry;  Laterality: Right;  . I&D EXTREMITY  Right 09/04/2017   Procedure: IRRIGATION AND DEBRIDEMENT EXTREMITY;  Surgeon: [name], DPM;  Location: [facility];  Service: Podiatry;  Laterality: Right;  . I&D EXTREMITY Right 11/12/2017   Procedure: IRRIGATION AND DEBRIDEMENT ULCER RIGHT FOOT;  Surgeon: [name], DPM;  Location: [facility];  Service: Podiatry;  Laterality: Right;  . LEFT HEART CATH AND CORONARY ANGIOGRAPHY N/A 06/18/2018   Procedure: LEFT HEART CATH AND CORONARY ANGIOGRAPHY;  Surgeon: [name], Peter M, MD;  Location: [place] [facility] LAB;   Service: Cardiovascular;  Laterality: N/A;  . WOUND DEBRIDEMENT N/A 09/24/2017   Procedure: DEBRIDEMENT WOUND;  Surgeon: [name], DPM;  Location: [facility];  Service: Podiatry;  Laterality: N/A;    There were no vitals filed for this visit.  Subjective Assessment - 08/28/18 1150    Subjective  No new complaitns. No falls or pain to report.     Pertinent History  R TTA, ESRD, DM2, HIV, cardiomyopathy, CHF, Tachycardia, A-Fib, Mallory-Weiss Tear, obesity    Limitations  Standing;Walking;House hold activities    Patient Stated Goals  To use bilateral prostheses to dance with wife & swimming,     Currently in Pain?  No/denies            [facility] Adult PT Treatment/Exercise - 08/28/18 1209      Ambulation/Gait   Ambulation/Gait  Yes    Ambulation/Gait Assistance  5: Supervision    Ambulation Distance (Feet)  100 Feet   x2, plus around gym   Assistive device  Prostheses;Straight cane    Gait Pattern  Step-through pattern;Trunk flexed;Wide base of support    Ambulation Surface  Level;Indoor    Curb  4: Min assist;Other (comment)   min guard assist   Curb Details (indicate cue type and reason)  blocked practice with bil aerobic steps next to parallel bars with cane/bil prostheses: min assist to min guard assist. pt demo's improved balance with descending if he rotates slightly sideways to descend.        Neuro Re-ed    Neuro Re-ed Details   for strengthening/muscle re-ed: seated on large pball- bouncing x1 minute, pelvic rocks laterally, pelvic rocks ant/post direction, long arc quads and combo contralateral UE/LE raises. min guard to min assist for balance with cues to maintain abdominal bracing with all exercises.                                    Exercises   Exercises  Other Exercises    Other Exercises   pt performed theraband ex's issued last session while seated on tall stool. cues needed on correct ex form/posture.       Prosthetics   Current prosthetic wear tolerance  (days/week)   daily    Current prosthetic wear tolerance (#hours/day)   most of awake hours including dialysis, removes for naps after dialysis    Residual limb condition   intact per pt report               PT Short Term Goals - 08/12/18 1847      PT SHORT TERM GOAL #1   Title  Patient verbalizes proper adjustment of ply socks with limb volume changes. (All STGs Target Date: 09/11/2018)    Baseline        Time  4    Period  Weeks    Status  New    Target Date  09/11/18  PT SHORT TERM GOAL #2   Title  Patient ambulates 200' negotiating around obstacles carrying plate of objects safely with verbal cues.     Baseline        Time  4    Period  Weeks    Status  New    Target Date  09/11/18      PT SHORT TERM GOAL #3   Title  Patient reaches to floor with cane to retrieve 3# object with supervision.    Baseline        Time  4    Period  Weeks    Status  New    Target Date  09/11/18      PT SHORT TERM GOAL #4   Title  Patient ambulates 750' with cane outdoors including 100' on grass with supervision.     Baseline        Time  4    Period  Weeks    Status  New    Target Date  09/11/18      PT SHORT TERM GOAL #5   Title  Patient negotiates curb with cane & prostheses with supervision.    Time  4    Period  Weeks    Status  New    Target Date  09/11/18        PT Long Term Goals - 08/14/18 1859      PT LONG TERM GOAL #1   Title  Patient verbalizes & demonstrates proper prosthetic care including problem solving advanced issues to enable safe use of bilateral Transtibial prostheses. (All LTGs Target Date: 08/14/2018)    Baseline        Time  12    Period  Weeks    Status  New    Target Date  11/06/18      PT LONG TERM GOAL #2   Title  Patient tolerates wear of bilateral Transtibial prostheses >90% of awake hours without skin issues or limb pain to enable function during most of his day.     Baseline        Time  12    Period  Weeks    Status  On-going     Target Date  11/06/18      PT LONG TERM GOAL #3   Title  Berg Balance >36/56    Baseline        Time  12    Period  Weeks    Status  New    Target Date  11/06/18      PT LONG TERM GOAL #4   Title  Patient ambulates 1000' outdoors including grass with cane or less & bilateral prostheses modified independent for community mobility.     Baseline        Time  12    Period  Weeks    Status  New    Target Date  11/06/18      PT LONG TERM GOAL #5   Title  Patient ambulates 200' around furniture carrying plate or cup with prostheses only modified independent for household mobility.     Baseline       Time  12    Period  Weeks    Status  Revised    Target Date  11/06/18      PT LONG TERM GOAL #6   Title  Patient negotiates ramps, curbs & stairs (1 rail) with cane or less & prostheses modified independent for community access.  Baseline        Time  12    Period  Weeks    Status  On-going    Target Date  11/06/18      PT LONG TERM GOAL #7   Title  6 Minute Walk Test with cane & prostheses >900'    Baseline       Time  12    Period  Weeks    Status  Revised    Target Date  11/06/18            Plan - 08/28/18 1151    Clinical Impression Statement  Today's skilled session continued to focus on strengthening and curb negotiation. Did not issue pball ex's today as pt needs to purchase a ball and needs additional practice in PT before doing at home alone. Pt is progressing toward goals and should benefit from continued PT to progress toward unmet goals.     Rehab Potential  Good    PT Frequency  1x / week    PT Duration  12 weeks    PT Treatment/Interventions  ADLs/Self Care Home Management;Canalith Repostioning;DME Instruction;Gait training;Stair training;Functional mobility training;Therapeutic activities;Therapeutic exercise;Balance training;Patient/family education;Prosthetic Training;Vestibular;Manual techniques    PT Next Visit Plan  review ex's on pball; continue  with curb negotation training    PT Home Exercise Plan  MedBridge Access Code: [email or web address]    Consulted and Agree with Plan of Care  Patient       Patient will benefit from skilled therapeutic intervention in order to improve the following deficits and impairments:  Abnormal gait, Cardiopulmonary status limiting activity, Decreased activity tolerance, Decreased balance, Decreased endurance, Decreased mobility, Decreased knowledge of use of DME, Decreased range of motion, Decreased scar mobility, Decreased strength, Postural dysfunction, Prosthetic Dependency  Visit Diagnosis: Unsteadiness on feet  Other abnormalities of gait and mobility  Muscle weakness (generalized)  Abnormal posture     Problem List Patient Active Problem List   Diagnosis Date Noted  . Non-ST elevation (NSTEMI) myocardial infarction ([place])   . Persistent atrial fibrillation with rapid ventricular response ([place]) 06/15/2018  . Chest pain with moderate risk for cardiac etiology 06/15/2018  . Atrial flutter ([place]) 06/15/2018  . Somniloquy 06/12/2018  . Encounter for therapeutic drug monitoring 05/27/2018  . Chronic systolic heart failure ([place])   . Demand ischemia ([place])   . Atrial fibrillation with RVR ([place]) 05/02/2018  . Murmur, cardiac 02/13/2018  . Mallory-Weiss tear 01/16/2018  . Morbid obesity ([place])   . Fall   . Subtherapeutic international normalized ratio (INR)   . History of left below knee amputation ([place]) 12/11/2017  . History of supraventricular tachycardia   . S/P bilateral BKA (below knee amputation) ([place])   . Anemia of chronic disease   . Leukocytosis   . Non-ischemic cardiomyopathy ([place])   . Hypokalemia 09/04/2017  . Essential hypertension 09/03/2017  . GERD (gastroesophageal reflux disease) 09/03/2017  . GIB (gastrointestinal bleeding) 07/26/2017  . Anemia due to end stage renal disease ([place]) 07/26/2017  . PAF (paroxysmal atrial fibrillation) ([place]) 07/26/2017  . Symptomatic anemia 07/26/2017   . End stage renal disease ([place]) 05/05/2017  . HIV disease ([place]) 03/25/2017  . Hypertensive heart disease with end stage renal disease on dialysis ([place]) 03/13/2017  . ESRD on dialysis ([place]) 03/13/2017  . Type 2 diabetes mellitus with complication ([place]) 19/37/9024  . SVT (supraventricular tachycardia) ([place])     Willow Ora, PTA, [facility] Outpatient Neuro [facility] [address], [place], [place] [address]  [phone] 08/29/18, 4:58 PM   Name: Thomas [name] MRN: [id number] Date of Birth: 1966/11/26

## 2018-09-03 ENCOUNTER — Encounter: Payer: Medicare Other | Admitting: Physical Therapy

## 2018-09-09 ENCOUNTER — Ambulatory Visit (INDEPENDENT_AMBULATORY_CARE_PROVIDER_SITE_OTHER): Payer: Medicare Other | Admitting: *Deleted

## 2018-09-09 DIAGNOSIS — Z5181 Encounter for therapeutic drug level monitoring: Secondary | ICD-10-CM | POA: Diagnosis not present

## 2018-09-09 DIAGNOSIS — I4891 Unspecified atrial fibrillation: Secondary | ICD-10-CM | POA: Diagnosis not present

## 2018-09-09 LAB — POCT INR: INR: 5.2 — AB (ref 2.0–3.0)

## 2018-09-09 NOTE — Patient Instructions (Signed)
Description   Do not take any Coumadin today and No Coumadin tomorrow then continue taking 1 tablet daily except 1/2 tablet on Sundays, Tuesdays and Fridays. Recheck INR in 1 week.  [facility] [phone] Main [phone]

## 2018-09-10 ENCOUNTER — Encounter: Payer: Medicare Other | Admitting: Physical Therapy

## 2018-09-16 ENCOUNTER — Encounter: Payer: Self-pay | Admitting: Physical Therapy

## 2018-09-16 ENCOUNTER — Ambulatory Visit: Payer: Medicare Other | Attending: Orthopedic Surgery | Admitting: Physical Therapy

## 2018-09-16 DIAGNOSIS — R2681 Unsteadiness on feet: Secondary | ICD-10-CM | POA: Diagnosis not present

## 2018-09-16 DIAGNOSIS — R293 Abnormal posture: Secondary | ICD-10-CM | POA: Insufficient documentation

## 2018-09-16 DIAGNOSIS — M6281 Muscle weakness (generalized): Secondary | ICD-10-CM | POA: Diagnosis present

## 2018-09-16 DIAGNOSIS — R2689 Other abnormalities of gait and mobility: Secondary | ICD-10-CM | POA: Insufficient documentation

## 2018-09-17 NOTE — Therapy (Signed)
[facility] [address] [place], [place], [address] Phone: [phone]   Fax:  [phone]  Physical Therapy Treatment  Patient Details  Name: Thomas [name] MRN: [id number] Date of Birth: 18-Aug-1966 Referring Provider: Meridee Score, MD   Encounter Date: 09/16/2018  PT End of Session - 09/16/18 1214    Visit Number  16    Number of Visits  26    Date for PT Re-Evaluation  08/14/18    Authorization Type  Medicare & Generic commercial    PT Start Time  1033    PT Stop Time  1100    PT Time Calculation (min)  27 min    Equipment Utilized During Treatment  Gait belt    Activity Tolerance  Patient tolerated treatment well    Behavior During Therapy  WFL for tasks assessed/performed       Past Medical History:  Diagnosis Date  . Anemia   . Atrial fibrillation ([place])   . Chest pain with moderate risk for cardiac etiology 06/15/2018  . Chronic combined systolic and diastolic CHF (congestive heart failure) ([place])   . Diabetic foot ulcer ([place]) 09/03/2017  . Diabetic wet gangrene of the foot ([place]) 09/04/2017  . ESRD (end stage renal disease) on dialysis [facility])    [place] [address] [place], [place], [place] (06/15/2018)  . Headache    "monthly" (06/15/2018)  . HIV disease ([place])   . Hyperparathyroidism, secondary ([place])   . Hypertension   . Hypertensive heart disease with end stage renal disease on dialysis ([place]) 03/13/2017  . Pneumonia X 1  . Subacute osteomyelitis, right ankle and foot ([place])   . SVT (supraventricular tachycardia) ([place])    ? afib or atrial flutter s/p TEE/DCCV with subsequent ablation due to reoccurrence in [place]  . Type 2 diabetes mellitus ([place])     Past Surgical History:  Procedure Laterality Date  . AMPUTATION Left    foot  . AMPUTATION Right 12/06/2017   Procedure: AMPUTATION BELOW KNEE;  Surgeon: [name], MD;  Location: [facility];  Service: Orthopedics;  Laterality: Right;  . APPLICATION OF WOUND VAC Right 09/04/2017    Procedure: APPLICATION OF WOUND VAC;  Surgeon: [name], DPM;  Location: [facility];  Service: Podiatry;  Laterality: Right;  . APPLICATION OF WOUND VAC  12/06/2017   Procedure: APPLICATION OF WOUND VAC;  Surgeon: [name], MD;  Location: [facility];  Service: Orthopedics;;  . AV FISTULA PLACEMENT Right 10/19/2012  . AV FISTULA REPAIR Right ~ 02/2018   "had it cleaned out"  . BELOW KNEE LEG AMPUTATION Left ~ 2016  . COLONOSCOPY WITH PROPOFOL N/A 07/30/2017   Procedure: COLONOSCOPY WITH PROPOFOL;  Surgeon: [name], MD;  Location: [facility];  Service: Endoscopy;  Laterality: N/A;  . ESOPHAGOGASTRODUODENOSCOPY (EGD) WITH PROPOFOL N/A 07/28/2017   Procedure: ESOPHAGOGASTRODUODENOSCOPY (EGD) WITH PROPOFOL;  Surgeon: [name], MD;  Location: [facility];  Service: Endoscopy;  Laterality: N/A;  . ESOPHAGOGASTRODUODENOSCOPY (EGD) WITH PROPOFOL N/A 01/15/2018   Procedure: ESOPHAGOGASTRODUODENOSCOPY (EGD) WITH PROPOFOL;  Surgeon: [name], MD;  Location: [facility] ENDOSCOPY;  Service: Endoscopy;  Laterality: N/A;  . FLEXIBLE SIGMOIDOSCOPY N/A 07/27/2017   Procedure: FLEXIBLE SIGMOIDOSCOPY;  Surgeon: [name], MD;  Location: [facility] ENDOSCOPY;  Service: Endoscopy;  Laterality: N/A;  . GRAFT APPLICATION Right 25/42/7062   Procedure: SKIN GRAFT APPLICATION RIGHT FOOT;  Surgeon: [name], DPM;  Location: [facility];  Service: Podiatry;  Laterality: Right;  . I&D EXTREMITY  Right 09/04/2017   Procedure: IRRIGATION AND DEBRIDEMENT EXTREMITY;  Surgeon: [name], DPM;  Location: [facility];  Service: Podiatry;  Laterality: Right;  . I&D EXTREMITY Right 11/12/2017   Procedure: IRRIGATION AND DEBRIDEMENT ULCER RIGHT FOOT;  Surgeon: [name], DPM;  Location: [facility];  Service: Podiatry;  Laterality: Right;  . LEFT HEART CATH AND CORONARY ANGIOGRAPHY N/A 06/18/2018   Procedure: LEFT HEART CATH AND CORONARY ANGIOGRAPHY;  Surgeon: [name], Peter M, MD;  Location: [facility] CV LAB;   Service: Cardiovascular;  Laterality: N/A;  . WOUND DEBRIDEMENT N/A 09/24/2017   Procedure: DEBRIDEMENT WOUND;  Surgeon: [name], DPM;  Location: [facility];  Service: Podiatry;  Laterality: N/A;    There were no vitals filed for this visit.  Subjective Assessment - 09/16/18 1037    Subjective  His coumadin levels are up. No bruising on residual limbs. He is wearing prostheses most of awake hours. No falls.     Pertinent History  R TTA, ESRD, DM2, HIV, cardiomyopathy, CHF, Tachycardia, A-Fib, Mallory-Weiss Tear, obesity    Limitations  Standing;Walking;House hold activities    Patient Stated Goals  To use bilateral prostheses to dance with wife & swimming,     Currently in Pain?  No/denies       Therapeutic Exercise with bil. Transtibial prostheses: Recommendation for exercises to include flexibility, strength, balance & endurance.  PT recommends machines initially over free weights. Need to use machines that work antagonistic muscle groups but not necessarily same amount of weight. PT recommends 4-6 LE, 4-6 UE/chest/upper back and 1-2 abdominal/back extensors.  Bil. Leg press with instruction in set-up and how to choose weight amount. 60# too light, 130# too heavy.  100# 15 reps & 110# 15 reps 2nd set. PT demo proper set up with UE machines.  SciFit recumbent stepper level 3 with BUEs & BLEs 10 minutes with instruction in set-up and machine choice of seated, back support that uses BUEs & BLEs at same time. Goal to increase time first to 30 minutes then work on speed or resistance. Pt verbalized understanding of above and plans to check out both [place]                    PT Education - 09/16/18 1030    Education provided  Yes    Education Details  coumadin level issues & clotting / bruising issues when PT too high. Monitor residual limbs for bruising. Vitamin K consistency with coumadin as he is eating broccoli at times.     Person(s) Educated  Patient     Methods  Explanation;Verbal cues    Comprehension  Verbalized understanding       PT Short Term Goals - 08/12/18 1847      PT SHORT TERM GOAL #1   Title  Patient verbalizes proper adjustment of ply socks with limb volume changes. (All STGs Target Date: 09/11/2018)    Baseline        Time  4    Period  Weeks    Status  New    Target Date  09/11/18      PT SHORT TERM GOAL #2   Title  Patient ambulates 200' negotiating around obstacles carrying plate of objects safely with verbal cues.     Baseline        Time  4    Period  Weeks    Status  New    Target Date  09/11/18  PT SHORT TERM GOAL #3   Title  Patient reaches to floor with cane to retrieve 3# object with supervision.    Baseline        Time  4    Period  Weeks    Status  New    Target Date  09/11/18      PT SHORT TERM GOAL #4   Title  Patient ambulates 750' with cane outdoors including 100' on grass with supervision.     Baseline        Time  4    Period  Weeks    Status  New    Target Date  09/11/18      PT SHORT TERM GOAL #5   Title  Patient negotiates curb with cane & prostheses with supervision.    Time  4    Period  Weeks    Status  New    Target Date  09/11/18        PT Long Term Goals - 08/14/18 1859      PT LONG TERM GOAL #1   Title  Patient verbalizes & demonstrates proper prosthetic care including problem solving advanced issues to enable safe use of bilateral Transtibial prostheses. (All LTGs Target Date: 08/14/2018)    Baseline        Time  12    Period  Weeks    Status  New    Target Date  11/06/18      PT LONG TERM GOAL #2   Title  Patient tolerates wear of bilateral Transtibial prostheses >90% of awake hours without skin issues or limb pain to enable function during most of his day.     Baseline        Time  12    Period  Weeks    Status  On-going    Target Date  11/06/18      PT LONG TERM GOAL #3   Title  Berg Balance >36/56    Baseline        Time  12    Period  Weeks     Status  New    Target Date  11/06/18      PT LONG TERM GOAL #4   Title  Patient ambulates 1000' outdoors including grass with cane or less & bilateral prostheses modified independent for community mobility.     Baseline        Time  12    Period  Weeks    Status  New    Target Date  11/06/18      PT LONG TERM GOAL #5   Title  Patient ambulates 200' around furniture carrying plate or cup with prostheses only modified independent for household mobility.     Baseline       Time  12    Period  Weeks    Status  Revised    Target Date  11/06/18      PT LONG TERM GOAL #6   Title  Patient negotiates ramps, curbs & stairs (1 rail) with cane or less & prostheses modified independent for community access.     Baseline        Time  12    Period  Weeks    Status  On-going    Target Date  11/06/18      PT LONG TERM GOAL #7   Title  6 Minute Walk Test with cane & prostheses >900'    Baseline  Time  12    Period  Weeks    Status  Revised    Target Date  11/06/18            Plan - 09/16/18 1800    Clinical Impression Statement  Patient has better understanding how to use exercise equipment at gym with bilateral Transtibial prostheses and medical issues.     Rehab Potential  Good    PT Frequency  1x / week    PT Duration  12 weeks    PT Treatment/Interventions  ADLs/Self Care Home Management;Canalith Repostioning;DME Instruction;Gait training;Stair training;Functional mobility training;Therapeutic activities;Therapeutic exercise;Balance training;Patient/family education;Prosthetic Training;Vestibular;Manual techniques    PT Next Visit Plan  check STGs and if gone to gym, review ex's on pball; continue with curb negotation training    PT Home Exercise Plan  MedBridge Access Code: [email or web address]    Consulted and Agree with Plan of Care  Patient       Patient will benefit from skilled therapeutic intervention in order to improve the following deficits and impairments:  Abnormal  gait, Cardiopulmonary status limiting activity, Decreased activity tolerance, Decreased balance, Decreased endurance, Decreased mobility, Decreased knowledge of use of DME, Decreased range of motion, Decreased scar mobility, Decreased strength, Postural dysfunction, Prosthetic Dependency  Visit Diagnosis: Unsteadiness on feet  Other abnormalities of gait and mobility  Muscle weakness (generalized)  Abnormal posture     Problem List Patient Active Problem List   Diagnosis Date Noted  . Non-ST elevation (NSTEMI) myocardial infarction ([place])   . Persistent atrial fibrillation with rapid ventricular response ([place]) 06/15/2018  . Chest pain with moderate risk for cardiac etiology 06/15/2018  . Atrial flutter ([place]) 06/15/2018  . Somniloquy 06/12/2018  . Encounter for therapeutic drug monitoring 05/27/2018  . Chronic systolic heart failure ([place])   . Demand ischemia ([place])   . Atrial fibrillation with RVR ([place]) 05/02/2018  . Murmur, cardiac 02/13/2018  . Mallory-Weiss tear 01/16/2018  . Morbid obesity ([place])   . Fall   . Subtherapeutic international normalized ratio (INR)   . History of left below knee amputation ([place]) 12/11/2017  . History of supraventricular tachycardia   . S/P bilateral BKA (below knee amputation) ([place])   . Anemia of chronic disease   . Leukocytosis   . Non-ischemic cardiomyopathy ([place])   . Hypokalemia 09/04/2017  . Essential hypertension 09/03/2017  . GERD (gastroesophageal reflux disease) 09/03/2017  . GIB (gastrointestinal bleeding) 07/26/2017  . Anemia due to end stage renal disease ([place]) 07/26/2017  . PAF (paroxysmal atrial fibrillation) ([place]) 07/26/2017  . Symptomatic anemia 07/26/2017  . End stage renal disease ([place]) 05/05/2017  . HIV disease ([place]) 03/25/2017  . Hypertensive heart disease with end stage renal disease on dialysis ([place]) 03/13/2017  . ESRD on dialysis ([place]) 03/13/2017  . Type 2 diabetes mellitus with complication ([place]) 37/48/2707  . SVT  (supraventricular tachycardia) ([facility])     Thomas [name] PT, DPT 09/17/2018, 8:04 AM  [facility] [address] [place], [place], [address] Phone: [phone]   Fax:  [phone]  Name: Thomas [name] MRN: [id number] Date of Birth: Aug 18, 1966

## 2018-09-18 ENCOUNTER — Ambulatory Visit (INDEPENDENT_AMBULATORY_CARE_PROVIDER_SITE_OTHER): Payer: Medicare Other | Admitting: *Deleted

## 2018-09-18 DIAGNOSIS — I4891 Unspecified atrial fibrillation: Secondary | ICD-10-CM

## 2018-09-18 DIAGNOSIS — Z5181 Encounter for therapeutic drug level monitoring: Secondary | ICD-10-CM | POA: Diagnosis not present

## 2018-09-18 LAB — POCT INR: INR: 3.4 — AB (ref 2.0–3.0)

## 2018-09-18 NOTE — Patient Instructions (Signed)
Description   Do not take any Coumadin today then start taking 1/2 tablet daily except 1 tablet on Mondays, Wednesdays, and Saturdays.  Recheck INR in 2 weeks.  [facility] [phone] Main [phone]

## 2018-09-21 MED FILL — BIKTARVY 50-200-25 MG TABS: 50-200-25 | 30 days supply | Qty: 30 | Fill #7

## 2018-09-23 ENCOUNTER — Encounter: Payer: Self-pay | Admitting: Physical Therapy

## 2018-09-23 ENCOUNTER — Ambulatory Visit: Payer: Medicare Other | Admitting: Physical Therapy

## 2018-09-23 DIAGNOSIS — R293 Abnormal posture: Secondary | ICD-10-CM

## 2018-09-23 DIAGNOSIS — M6281 Muscle weakness (generalized): Secondary | ICD-10-CM

## 2018-09-23 DIAGNOSIS — R2689 Other abnormalities of gait and mobility: Secondary | ICD-10-CM

## 2018-09-23 DIAGNOSIS — R2681 Unsteadiness on feet: Secondary | ICD-10-CM

## 2018-09-23 NOTE — Therapy (Signed)
[facility] [address] [place], [place], [address] Phone: [phone]   Fax:  [phone]  Physical Therapy Treatment  Patient Details  Name: Thomas [name] MRN: [id number] Date of Birth: 02-24-1966 Referring Provider: Meridee Score, MD   Encounter Date: 09/23/2018  PT End of Session - 09/23/18 1315    Visit Number  17    Number of Visits  26    Date for PT Re-Evaluation  08/14/18    Authorization Type  Medicare & Generic commercial    PT Start Time  1230    PT Stop Time  1315    PT Time Calculation (min)  45 min    Equipment Utilized During Treatment  Gait belt    Activity Tolerance  Patient tolerated treatment well    Behavior During Therapy  [facility] for tasks assessed/performed       Past Medical History:  Diagnosis Date  . Anemia   . Atrial fibrillation ([place])   . Chest pain with moderate risk for cardiac etiology 06/15/2018  . Chronic combined systolic and diastolic CHF (congestive heart failure) ([place])   . Diabetic foot ulcer ([place]) 09/03/2017  . Diabetic wet gangrene of the foot ([place]) 09/04/2017  . ESRD (end stage renal disease) on dialysis [facility])    [facility] [address], [place], [place] (06/15/2018)  . Headache    "monthly" (06/15/2018)  . HIV disease ([place])   . Hyperparathyroidism, secondary ([place])   . Hypertension   . Hypertensive heart disease with end stage renal disease on dialysis ([place]) 03/13/2017  . Pneumonia X 1  . Subacute osteomyelitis, right ankle and foot ([place])   . SVT (supraventricular tachycardia) ([place])    ? afib or atrial flutter s/p TEE/DCCV with subsequent ablation due to reoccurrence in [place]  . Type 2 diabetes mellitus ([place])     Past Surgical History:  Procedure Laterality Date  . AMPUTATION Left    foot  . AMPUTATION Right 12/06/2017   Procedure: AMPUTATION BELOW KNEE;  Surgeon: [name], MD;  Location: [facility];  Service: Orthopedics;  Laterality: Right;  . APPLICATION OF WOUND VAC Right 09/04/2017    Procedure: APPLICATION OF WOUND VAC;  Surgeon: [name], DPM;  Location: [facility];  Service: Podiatry;  Laterality: Right;  . APPLICATION OF WOUND VAC  12/06/2017   Procedure: APPLICATION OF WOUND VAC;  Surgeon: [name], MD;  Location: [facility];  Service: Orthopedics;;  . AV FISTULA PLACEMENT Right 10/19/2012  . AV FISTULA REPAIR Right ~ 02/2018   "had it cleaned out"  . BELOW KNEE LEG AMPUTATION Left ~ 2016  . COLONOSCOPY WITH PROPOFOL N/A 07/30/2017   Procedure: COLONOSCOPY WITH PROPOFOL;  Surgeon: [name], MD;  Location: [facility];  Service: Endoscopy;  Laterality: N/A;  . ESOPHAGOGASTRODUODENOSCOPY (EGD) WITH PROPOFOL N/A 07/28/2017   Procedure: ESOPHAGOGASTRODUODENOSCOPY (EGD) WITH PROPOFOL;  Surgeon: [name], MD;  Location: [facility];  Service: Endoscopy;  Laterality: N/A;  . ESOPHAGOGASTRODUODENOSCOPY (EGD) WITH PROPOFOL N/A 01/15/2018   Procedure: ESOPHAGOGASTRODUODENOSCOPY (EGD) WITH PROPOFOL;  Surgeon: [name], MD;  Location: [facility] ENDOSCOPY;  Service: Endoscopy;  Laterality: N/A;  . FLEXIBLE SIGMOIDOSCOPY N/A 07/27/2017   Procedure: FLEXIBLE SIGMOIDOSCOPY;  Surgeon: [name], MD;  Location: [facility] ENDOSCOPY;  Service: Endoscopy;  Laterality: N/A;  . GRAFT APPLICATION Right 23/76/2831   Procedure: SKIN GRAFT APPLICATION RIGHT FOOT;  Surgeon: [name], DPM;  Location: [facility];  Service: Podiatry;  Laterality: Right;  . I&D EXTREMITY  Right 09/04/2017   Procedure: IRRIGATION AND DEBRIDEMENT EXTREMITY;  Surgeon: [name], DPM;  Location: [facility];  Service: Podiatry;  Laterality: Right;  . I&D EXTREMITY Right 11/12/2017   Procedure: IRRIGATION AND DEBRIDEMENT ULCER RIGHT FOOT;  Surgeon: [name], DPM;  Location: [facility];  Service: Podiatry;  Laterality: Right;  . LEFT HEART CATH AND CORONARY ANGIOGRAPHY N/A 06/18/2018   Procedure: LEFT HEART CATH AND CORONARY ANGIOGRAPHY;  Surgeon: [name], Peter M, MD;  Location: [facility];   Service: Cardiovascular;  Laterality: N/A;  . WOUND DEBRIDEMENT N/A 09/24/2017   Procedure: DEBRIDEMENT WOUND;  Surgeon: [name], DPM;  Location: [facility];  Service: Podiatry;  Laterality: N/A;    There were no vitals filed for this visit.  Subjective Assessment - 09/23/18 1230    Subjective  No falls. He has been walking with prostheses more.     Pertinent History  R TTA, ESRD, DM2, HIV, cardiomyopathy, CHF, Tachycardia, A-Fib, Mallory-Weiss Tear, obesity    Limitations  Standing;Walking;House hold activities    Patient Stated Goals  To use bilateral prostheses to dance with wife & swimming,     Currently in Pain?  No/denies                       [facility] Adult PT Treatment/Exercise - 09/23/18 1230      Transfers   Transfers  Sit to Stand;Stand to Sit    Sit to Stand  5: Supervision;Without upper extremity assist;From elevated surface   from 24" stool without UE assist with bars close    Sit to Stand Details  Tactile cues for weight shifting    Stand to Sit  5: Supervision;Without upper extremity assist;To elevated surface   to 24" stool without UE assist with bars close    Stand to Sit Details (indicate cue type and reason)  Tactile cues for weight shifting      Ambulation/Gait   Ambulation/Gait  Yes    Ambulation/Gait Assistance  5: Supervision    Ambulation/Gait Assistance Details  right prosthesis has lateral pylon lean with patient improved weight shift in stance.     Ambulation Distance (Feet)  750 Feet   750' cane & 100' indoor around furniture carrying plate   Assistive device  Prostheses;Straight cane;None    Gait Pattern  Step-through pattern;Trunk flexed;Wide base of support    Ambulation Surface  Indoor;Level;Outdoor;Paved;Gravel;Grass    Stairs  Yes    Stairs Assistance  5: Supervision    Stairs Assistance Details (indicate cue type and reason)  verbal cues on alternate pattern     Stair Management Technique  Two rails;Alternating pattern;Forwards     Number of Stairs  4   3 reps   Ramp  5: Supervision   cane & prostheses   Curb  4: Min assist;Other (comment)   min guard assist, cane & prostheses   Curb Details (indicate cue type and reason)  Pt still needs light touch for support      Neuro Re-ed    Neuro Re-ed Details   standing without UE support with bars close for intermittent touch: green theraband reciprocal & BUEs rows, overhead reach and forward reach - 10 reps each.       Exercises   Exercises  Other Exercises    Other Exercises   seated on 24" bar stool: forward lean/recovery, back lean recovery, trunk rotation and side bends.       Prosthetics   Current  prosthetic wear tolerance (days/week)   daily    Current prosthetic wear tolerance (#hours/day)   most of awake hours including dialysis, removes for naps after dialysis    Residual limb condition   intact per pt report               PT Short Term Goals - 09/23/18 2135      PT SHORT TERM GOAL #1   Title  Patient verbalizes proper adjustment of ply socks with limb volume changes. (All STGs Target Date: 09/11/2018)    Baseline  MET 09/23/2018    Time  4    Period  Weeks    Status  Achieved      PT SHORT TERM GOAL #2   Title  Patient ambulates 200' negotiating around obstacles carrying plate of objects safely with verbal cues.     Baseline  MET 09/23/2018    Time  4    Period  Weeks    Status  Achieved      PT SHORT TERM GOAL #3   Title  Patient reaches to floor with cane to retrieve 3# object with supervision.    Baseline  MET 09/23/2018    Time  4    Period  Weeks    Status  Achieved      PT SHORT TERM GOAL #4   Title  Patient ambulates 750' with cane outdoors including 100' on grass with supervision.     Baseline  MET 09/23/2018    Time  4    Period  Weeks    Status  Achieved      PT SHORT TERM GOAL #5   Title  Patient negotiates curb with cane & prostheses with supervision.    Baseline  patient improved curb but requires light touch on car  hood or PT hand    Time  4    Period  Weeks    Status  Partially Met        PT Long Term Goals - 08/14/18 1859      PT LONG TERM GOAL #1   Title  Patient verbalizes & demonstrates proper prosthetic care including problem solving advanced issues to enable safe use of bilateral Transtibial prostheses. (All LTGs Target Date: 08/14/2018)    Baseline        Time  12    Period  Weeks    Status  New    Target Date  11/06/18      PT LONG TERM GOAL #2   Title  Patient tolerates wear of bilateral Transtibial prostheses >90% of awake hours without skin issues or limb pain to enable function during most of his day.     Baseline        Time  12    Period  Weeks    Status  On-going    Target Date  11/06/18      PT LONG TERM GOAL #3   Title  Berg Balance >36/56    Baseline        Time  12    Period  Weeks    Status  New    Target Date  11/06/18      PT LONG TERM GOAL #4   Title  Patient ambulates 1000' outdoors including grass with cane or less & bilateral prostheses modified independent for community mobility.     Baseline        Time  12    Period  Weeks    Status  New    Target Date  11/06/18      PT LONG TERM GOAL #5   Title  Patient ambulates 200' around furniture carrying plate or cup with prostheses only modified independent for household mobility.     Baseline       Time  12    Period  Weeks    Status  Revised    Target Date  11/06/18      PT LONG TERM GOAL #6   Title  Patient negotiates ramps, curbs & stairs (1 rail) with cane or less & prostheses modified independent for community access.     Baseline        Time  12    Period  Weeks    Status  On-going    Target Date  11/06/18      PT LONG TERM GOAL #7   Title  6 Minute Walk Test with cane & prostheses >900'    Baseline       Time  12    Period  Weeks    Status  Revised    Target Date  11/06/18            Plan - 09/23/18 2133    Clinical Impression Statement  Standing balance activities to  facilitate core stabilization fatigued patient's back. Pt met all STGs except curb with only supervision.     Rehab Potential  Good    PT Frequency  1x / week    PT Duration  12 weeks    PT Treatment/Interventions  ADLs/Self Care Home Management;Canalith Repostioning;DME Instruction;Gait training;Stair training;Functional mobility training;Therapeutic activities;Therapeutic exercise;Balance training;Patient/family education;Prosthetic Training;Vestibular;Manual techniques    PT Next Visit Plan  work towards [place],        Patient will benefit from skilled therapeutic intervention in order to improve the following deficits and impairments:  Abnormal gait, Cardiopulmonary status limiting activity, Decreased activity tolerance, Decreased balance, Decreased endurance, Decreased mobility, Decreased knowledge of use of DME, Decreased range of motion, Decreased scar mobility, Decreased strength, Postural dysfunction, Prosthetic Dependency  Visit Diagnosis: Unsteadiness on feet  Other abnormalities of gait and mobility  Muscle weakness (generalized)  Abnormal posture     Problem List Patient Active Problem List   Diagnosis Date Noted  . Non-ST elevation (NSTEMI) myocardial infarction ([place])   . Persistent atrial fibrillation with rapid ventricular response ([place]) 06/15/2018  . Chest pain with moderate risk for cardiac etiology 06/15/2018  . Atrial flutter ([place]) 06/15/2018  . Somniloquy 06/12/2018  . Encounter for therapeutic drug monitoring 05/27/2018  . Chronic systolic heart failure ([place])   . Demand ischemia ([place])   . Atrial fibrillation with RVR ([place]) 05/02/2018  . Murmur, cardiac 02/13/2018  . Mallory-Weiss tear 01/16/2018  . Morbid obesity ([place])   . Fall   . Subtherapeutic international normalized ratio (INR)   . History of left below knee amputation ([place]) 12/11/2017  . History of supraventricular tachycardia   . S/P bilateral BKA (below knee amputation) ([place])   . Anemia of  chronic disease   . Leukocytosis   . Non-ischemic cardiomyopathy ([place])   . Hypokalemia 09/04/2017  . Essential hypertension 09/03/2017  . GERD (gastroesophageal reflux disease) 09/03/2017  . GIB (gastrointestinal bleeding) 07/26/2017  . Anemia due to end stage renal disease ([place]) 07/26/2017  . PAF (paroxysmal atrial fibrillation) ([place]) 07/26/2017  . Symptomatic anemia 07/26/2017  . End stage renal disease ([place]) 05/05/2017  . HIV disease ([place]) 03/25/2017  . Hypertensive heart disease with end stage renal  disease on dialysis ([place]) 03/13/2017  . ESRD on dialysis ([place]) 03/13/2017  . Type 2 diabetes mellitus with complication ([place]) 16/09/9603  . SVT (supraventricular tachycardia) ([place])     Elenor Wildes PT, DPT 09/23/2018, 9:38 PM  [facility] [address] [place], [place], [address] Phone: [phone]   Fax:  [phone]  Name: Thomas [name] MRN: [id number] Date of Birth: 1966/02/12

## 2018-09-24 ENCOUNTER — Encounter: Payer: Medicare Other | Admitting: Internal Medicine

## 2018-09-30 ENCOUNTER — Ambulatory Visit: Payer: Medicare Other | Attending: Orthopedic Surgery | Admitting: Physical Therapy

## 2018-09-30 DIAGNOSIS — R293 Abnormal posture: Secondary | ICD-10-CM | POA: Insufficient documentation

## 2018-09-30 DIAGNOSIS — Z9181 History of falling: Secondary | ICD-10-CM | POA: Insufficient documentation

## 2018-09-30 DIAGNOSIS — R2681 Unsteadiness on feet: Secondary | ICD-10-CM | POA: Insufficient documentation

## 2018-09-30 DIAGNOSIS — M6281 Muscle weakness (generalized): Secondary | ICD-10-CM | POA: Insufficient documentation

## 2018-09-30 DIAGNOSIS — R2689 Other abnormalities of gait and mobility: Secondary | ICD-10-CM | POA: Insufficient documentation

## 2018-10-07 ENCOUNTER — Ambulatory Visit (INDEPENDENT_AMBULATORY_CARE_PROVIDER_SITE_OTHER): Payer: Medicare Other | Admitting: *Deleted

## 2018-10-07 ENCOUNTER — Ambulatory Visit: Payer: Medicare Other | Admitting: Physical Therapy

## 2018-10-07 DIAGNOSIS — Z5181 Encounter for therapeutic drug level monitoring: Secondary | ICD-10-CM | POA: Diagnosis not present

## 2018-10-07 DIAGNOSIS — I4891 Unspecified atrial fibrillation: Secondary | ICD-10-CM

## 2018-10-07 LAB — POCT INR: INR: 2.8 (ref 2.0–3.0)

## 2018-10-07 MED ORDER — WARFARIN SODIUM 7.5 MG PO TABS: ORAL_TABLET | ORAL | 0 refills | Status: DC

## 2018-10-07 NOTE — Patient Instructions (Signed)
Description   Continue taking 1/2 tablet daily except 1 tablet on Mondays, Wednesdays, and Saturdays.  Recheck INR in 4 weeks.  [facility] [phone] Main [phone]

## 2018-10-14 ENCOUNTER — Encounter: Payer: Self-pay | Admitting: Physical Therapy

## 2018-10-14 ENCOUNTER — Ambulatory Visit: Payer: Medicare Other | Admitting: Physical Therapy

## 2018-10-14 DIAGNOSIS — R2681 Unsteadiness on feet: Secondary | ICD-10-CM

## 2018-10-14 DIAGNOSIS — M6281 Muscle weakness (generalized): Secondary | ICD-10-CM | POA: Diagnosis present

## 2018-10-14 DIAGNOSIS — R2689 Other abnormalities of gait and mobility: Secondary | ICD-10-CM | POA: Diagnosis present

## 2018-10-14 DIAGNOSIS — R293 Abnormal posture: Secondary | ICD-10-CM

## 2018-10-14 DIAGNOSIS — Z9181 History of falling: Secondary | ICD-10-CM | POA: Diagnosis present

## 2018-10-15 ENCOUNTER — Emergency Department (HOSPITAL_COMMUNITY): Payer: Medicare Other

## 2018-10-15 ENCOUNTER — Observation Stay (HOSPITAL_COMMUNITY)
Admission: EM | Admit: 2018-10-15 | Discharge: 2018-10-16 | Disposition: A | Discharge status: Home / Self Care | Payer: Medicare Other | Attending: Internal Medicine | Admitting: Internal Medicine

## 2018-10-15 ENCOUNTER — Encounter (HOSPITAL_COMMUNITY): Payer: Self-pay

## 2018-10-15 ENCOUNTER — Other Ambulatory Visit: Payer: Self-pay

## 2018-10-15 DIAGNOSIS — Z794 Long term (current) use of insulin: Secondary | ICD-10-CM | POA: Insufficient documentation

## 2018-10-15 DIAGNOSIS — N2581 Secondary hyperparathyroidism of renal origin: Secondary | ICD-10-CM | POA: Diagnosis not present

## 2018-10-15 DIAGNOSIS — R079 Chest pain, unspecified: Secondary | ICD-10-CM

## 2018-10-15 DIAGNOSIS — Z7901 Long term (current) use of anticoagulants: Secondary | ICD-10-CM | POA: Insufficient documentation

## 2018-10-15 DIAGNOSIS — Z89512 Acquired absence of left leg below knee: Secondary | ICD-10-CM | POA: Insufficient documentation

## 2018-10-15 DIAGNOSIS — N186 End stage renal disease: Secondary | ICD-10-CM | POA: Diagnosis not present

## 2018-10-15 DIAGNOSIS — R0789 Other chest pain: Secondary | ICD-10-CM | POA: Diagnosis not present

## 2018-10-15 DIAGNOSIS — Z6837 Body mass index (BMI) 37.0-37.9, adult: Secondary | ICD-10-CM | POA: Insufficient documentation

## 2018-10-15 DIAGNOSIS — I132 Hypertensive heart and chronic kidney disease with heart failure and with stage 5 chronic kidney disease, or end stage renal disease: Secondary | ICD-10-CM | POA: Diagnosis not present

## 2018-10-15 DIAGNOSIS — I428 Other cardiomyopathies: Secondary | ICD-10-CM | POA: Insufficient documentation

## 2018-10-15 DIAGNOSIS — D631 Anemia in chronic kidney disease: Secondary | ICD-10-CM | POA: Diagnosis not present

## 2018-10-15 DIAGNOSIS — B20 Human immunodeficiency virus [HIV] disease: Secondary | ICD-10-CM | POA: Diagnosis not present

## 2018-10-15 DIAGNOSIS — I471 Supraventricular tachycardia: Secondary | ICD-10-CM | POA: Insufficient documentation

## 2018-10-15 DIAGNOSIS — K219 Gastro-esophageal reflux disease without esophagitis: Secondary | ICD-10-CM | POA: Diagnosis not present

## 2018-10-15 DIAGNOSIS — I5042 Chronic combined systolic (congestive) and diastolic (congestive) heart failure: Secondary | ICD-10-CM | POA: Insufficient documentation

## 2018-10-15 DIAGNOSIS — Z89511 Acquired absence of right leg below knee: Secondary | ICD-10-CM | POA: Insufficient documentation

## 2018-10-15 DIAGNOSIS — E1165 Type 2 diabetes mellitus with hyperglycemia: Secondary | ICD-10-CM | POA: Diagnosis not present

## 2018-10-15 DIAGNOSIS — E1151 Type 2 diabetes mellitus with diabetic peripheral angiopathy without gangrene: Secondary | ICD-10-CM | POA: Diagnosis not present

## 2018-10-15 DIAGNOSIS — I4891 Unspecified atrial fibrillation: Secondary | ICD-10-CM

## 2018-10-15 DIAGNOSIS — Z8249 Family history of ischemic heart disease and other diseases of the circulatory system: Secondary | ICD-10-CM | POA: Insufficient documentation

## 2018-10-15 DIAGNOSIS — E1122 Type 2 diabetes mellitus with diabetic chronic kidney disease: Secondary | ICD-10-CM | POA: Diagnosis not present

## 2018-10-15 DIAGNOSIS — R05 Cough: Secondary | ICD-10-CM | POA: Diagnosis not present

## 2018-10-15 DIAGNOSIS — R197 Diarrhea, unspecified: Secondary | ICD-10-CM | POA: Diagnosis not present

## 2018-10-15 DIAGNOSIS — I480 Paroxysmal atrial fibrillation: Secondary | ICD-10-CM | POA: Diagnosis not present

## 2018-10-15 DIAGNOSIS — Z79899 Other long term (current) drug therapy: Secondary | ICD-10-CM | POA: Insufficient documentation

## 2018-10-15 DIAGNOSIS — Z992 Dependence on renal dialysis: Secondary | ICD-10-CM | POA: Insufficient documentation

## 2018-10-15 DIAGNOSIS — I25110 Atherosclerotic heart disease of native coronary artery with unstable angina pectoris: Secondary | ICD-10-CM | POA: Insufficient documentation

## 2018-10-15 LAB — CBC
HCT: 37.4 % — ABNORMAL LOW (ref 39.0–52.0)
Hemoglobin: 12.5 g/dL — ABNORMAL LOW (ref 13.0–17.0)
MCH: 32.2 pg (ref 26.0–34.0)
MCHC: 33.4 g/dL (ref 30.0–36.0)
MCV: 96.4 fL (ref 80.0–100.0)
Platelets: 232 10*3/uL (ref 150–400)
RBC: 3.88 MIL/uL — AB (ref 4.22–5.81)
RDW: 14.6 % (ref 11.5–15.5)
WBC: 6.6 10*3/uL (ref 4.0–10.5)
nRBC: 0 % (ref 0.0–0.2)

## 2018-10-15 LAB — I-STAT TROPONIN, ED
TROPONIN I, POC: 0.1 ng/mL — AB (ref 0.00–0.08)
Troponin i, poc: 0.11 ng/mL (ref 0.00–0.08)

## 2018-10-15 LAB — BASIC METABOLIC PANEL
ANION GAP: 16 — AB (ref 5–15)
BUN: 26 mg/dL — ABNORMAL HIGH (ref 6–20)
CO2: 25 mmol/L (ref 22–32)
Calcium: 9.1 mg/dL (ref 8.9–10.3)
Chloride: 95 mmol/L — ABNORMAL LOW (ref 98–111)
Creatinine, Ser: 7.22 mg/dL — ABNORMAL HIGH (ref 0.61–1.24)
GFR calc Af Amer: 9 mL/min — ABNORMAL LOW (ref >60)
GFR calc non Af Amer: 8 mL/min — ABNORMAL LOW (ref >60)
GLUCOSE: 276 mg/dL — AB (ref 70–99)
POTASSIUM: 4.2 mmol/L (ref 3.5–5.1)
Sodium: 136 mmol/L (ref 135–145)

## 2018-10-15 LAB — TROPONIN I: Troponin I: 0.08 ng/mL (ref <0.03)

## 2018-10-15 LAB — PROTIME-INR
INR: 2.03
PROTHROMBIN TIME: 22.7 s — AB (ref 11.4–15.2)

## 2018-10-15 LAB — GLUCOSE, CAPILLARY: Glucose-Capillary: 379 mg/dL — ABNORMAL HIGH (ref 70–99)

## 2018-10-15 MED ORDER — METOPROLOL SUCCINATE ER 100 MG PO TB24
100.0000 mg | ORAL_TABLET | Freq: Every day | ORAL | Status: DC
  Administered 2018-10-16: 100 mg via ORAL
  Filled 2018-10-15: qty 1

## 2018-10-15 MED ORDER — ACETAMINOPHEN 500 MG PO TABS
1000.0000 mg | ORAL_TABLET | Freq: Once | ORAL | Status: AC
  Administered 2018-10-15: 1000 mg via ORAL
  Filled 2018-10-15: qty 2

## 2018-10-15 MED ORDER — ACETAMINOPHEN 325 MG PO TABS
650.0000 mg | ORAL_TABLET | Freq: Four times a day (QID) | ORAL | Status: DC | PRN
  Administered 2018-10-16: 650 mg via ORAL
  Filled 2018-10-15: qty 2

## 2018-10-15 MED ORDER — CINACALCET HCL 30 MG PO TABS
90.0000 mg | ORAL_TABLET | Freq: Every day | ORAL | Status: DC
  Administered 2018-10-16: 90 mg via ORAL
  Filled 2018-10-15: qty 3

## 2018-10-15 MED ORDER — WARFARIN - PHYSICIAN DOSING INPATIENT: Freq: Every day | Status: DC

## 2018-10-15 MED ORDER — WARFARIN SODIUM 2.5 MG PO TABS
3.2500 mg | ORAL_TABLET | ORAL | Status: DC
  Administered 2018-10-15: 3.25 mg via ORAL
  Filled 2018-10-15 (×2): qty 0.5

## 2018-10-15 MED ORDER — INSULIN GLARGINE 100 UNIT/ML ~~LOC~~ SOLN
10.0000 [IU] | Freq: Every day | SUBCUTANEOUS | Status: DC
  Administered 2018-10-15: 10 [IU] via SUBCUTANEOUS
  Filled 2018-10-15 (×2): qty 0.1

## 2018-10-15 MED ORDER — CALCIUM POLYCARBOPHIL 625 MG PO TABS
625.0000 mg | ORAL_TABLET | Freq: Every day | ORAL | Status: DC
  Administered 2018-10-16: 625 mg via ORAL
  Filled 2018-10-15: qty 1

## 2018-10-15 MED ORDER — WARFARIN SODIUM 7.5 MG PO TABS: 7.5000 mg | ORAL_TABLET | ORAL | Status: DC

## 2018-10-15 MED ORDER — BICTEGRAVIR-EMTRICITAB-TENOFOV 50-200-25 MG PO TABS
1.0000 | ORAL_TABLET | Freq: Every day | ORAL | Status: DC
  Administered 2018-10-16: 1 via ORAL
  Filled 2018-10-15: qty 1

## 2018-10-15 MED ORDER — SODIUM CHLORIDE 0.9 % IV BOLUS: 500.0000 mL | Freq: Once | INTRAVENOUS | Status: DC

## 2018-10-15 MED ORDER — PANTOPRAZOLE SODIUM 40 MG PO TBEC
80.0000 mg | DELAYED_RELEASE_TABLET | Freq: Every day | ORAL | Status: DC
  Administered 2018-10-16: 80 mg via ORAL
  Filled 2018-10-15: qty 2

## 2018-10-15 MED ORDER — ACETAMINOPHEN 650 MG RE SUPP: 650.0000 mg | Freq: Four times a day (QID) | RECTAL | Status: DC | PRN

## 2018-10-15 MED ORDER — WARFARIN SODIUM 2.5 MG PO TABS
3.2500 mg | ORAL_TABLET | Freq: Every day | ORAL | Status: DC
  Filled 2018-10-15: qty 0.5

## 2018-10-15 MED ORDER — CAMPHOR-MENTHOL 0.5-0.5 % EX LOTN
TOPICAL_LOTION | CUTANEOUS | Status: DC | PRN
  Filled 2018-10-15: qty 222

## 2018-10-15 MED ORDER — AMIODARONE HCL 200 MG PO TABS
200.0000 mg | ORAL_TABLET | Freq: Every day | ORAL | Status: DC
  Administered 2018-10-15 – 2018-10-16 (×2): 200 mg via ORAL
  Filled 2018-10-15 (×2): qty 1

## 2018-10-15 MED ORDER — INSULIN ASPART 100 UNIT/ML ~~LOC~~ SOLN
0.0000 [IU] | Freq: Three times a day (TID) | SUBCUTANEOUS | Status: DC
  Administered 2018-10-16: 11 [IU] via SUBCUTANEOUS
  Filled 2018-10-15: qty 1

## 2018-10-15 MED ORDER — GABAPENTIN 100 MG PO CAPS: 100.0000 mg | ORAL_CAPSULE | Freq: Every day | ORAL | Status: DC | PRN

## 2018-10-15 MED ORDER — WARFARIN - PHARMACIST DOSING INPATIENT
Freq: Every day | Status: DC
  Administered 2018-10-15: 23:00:00

## 2018-10-15 NOTE — ED Triage Notes (Signed)
Pt presents to ed from dialysis with complaints of cp and sob that woke him up out of sleep at 0500. Pt reports pain returned during dialysis. Pt received 2/3 of tx. Denies n/v/diaphoresis. Pt afib rate 60-90 per ems. Hx of same.

## 2018-10-15 NOTE — H&P (Addendum)
Date: 10/15/2018               Patient Name:  Thomas [name] MRN: [id number]  DOB: 10-Aug-1966 Age / Sex: 52 y.o., male   PCP: [name], MD         Medical Service: Internal Medicine Teaching Service         Attending Physician: Dr. Aldine Contes, MD    First Contact: Dr. Laural Golden, Aree[place] Pager: [phone]  Second Contact: Dr. Kalman Shan Pager: [phone]       After Hours (After 5p/  First Contact Pager: [phone]  weekends / holidays): Second Contact Pager: [phone]   Chief Complaint: Chest Pain  History of Present Illness:  Thomas [name] is a 52 yo M w/ PMH of ESRD on dialysis [facility], A.fib on Coumadin, systolic/diastolic CHF (last echo 95-18%, grade 2 diastolic dysfx on 07/4165), CAD (2 vessel severe obstruction on Left heart cath 05/2018), HIV on Biktarvy, GERD, and T2DM s/p bilateral BKA presenting with chest pain. He was examined and evaluated at bedside with his wife present. He states he was in his usual state of health of early this morning at 5am when he experienced 6/10 substernal chest tightness that woke him up from sleep. Pain was described as tightness and pressure mid-sternal and right sternal border without radiation. He had concomitant dyspnea and headache. Chest pain spontaneously resolved after 20 minutes. He went to his regularly scheduled dialysis session and the pain re-occurred about 1 hour and 15 minutes into the session. This time the pain was also associated with diaphoresis and nurse noted blood pressure of 74/46. Nurse did not give nitrates as his bp was low and he was brought by EMS to ED for evaluation. He states his symptoms have currently resolved. He denies any lapse in his medications last night but he did not take his medications today as he usually waits until after his dialysis (1-4pm) to take them.  He states he had similar symptoms in the past ever since he was diagnosed with a.fib and heart failure and he gets recurrent anginal symptoms every 6  months or so with admission for ACS r/o. He denies any recent fever, chills, nausea, vomiting, constipation. He states he has diarrhea treated at home with imodium which is chronic. He also mentions chronic dry cough that has been ongoing for 2 years. Denies any blurry vision, light-headedness, facial droop, or slurred speech. He mentions that he had increased swelling in his stump recently which resolved after compression stocking use.   On chart review, during his last admission for ACS r/o he presented with A.flutter with RVR and was on IV amiodarone and dilt drip. He underwent Left Cath which showed 2 vessel obstructive disease with mid LAD 80% stenosis and distal LAD w/ 90% stenosis. He was recommended for medical management at that time w/o stent placement, and he has been following up with his cardiology closely.  In the ED, he was given 1 dose of Tylenol. He was found to have elevated troponin at 0.10 (which is close to his baseline). EKG did not show ST changes. IM was consulted for admission for ACS rule out.  Meds:  Current Meds  Medication Sig  . amiodarone (PACERONE) 200 MG tablet Take 1 tablet (200 mg total) by mouth daily.  . bictegravir-emtricitabine-tenofovir AF (BIKTARVY) 50-200-25 MG TABS tablet Take 1 tablet by mouth daily.  . camphor-menthol (SARNA) lotion Apply topically as needed for itching.  . cinacalcet (SENSIPAR) 90 MG tablet Take 90  mg by mouth daily.  . ferric gluconate 125 mg in sodium chloride 0.9 % 100 mL Inject 125 mg into the vein Every Tuesday,Thursday,and Saturday with dialysis.  Marland Kitchen gabapentin (NEURONTIN) 100 MG capsule Take 1 capsule (100 mg total) by mouth every dialysis.  Marland Kitchen insulin glargine (LANTUS) 100 unit/mL SOPN Inject 0.3 mLs (30 Units total) into the skin at bedtime. (Patient taking differently: Inject 20 Units into the skin at bedtime. )  . metoprolol succinate (TOPROL-XL) 100 MG 24 hr tablet TAKE 1 TABLET BY MOUTH ONCE DAILY TAKE WITH OR IMMEDIATELY  FOLLOWING A MEAL  . pantoprazole (PROTONIX) 40 MG tablet Take 2 tablets (80 mg total) by mouth daily.  . polycarbophil (FIBERCON) 625 MG tablet Take 1 tablet (625 mg total) by mouth daily.  Marland Kitchen warfarin (COUMADIN) 7.5 MG tablet TAKE 1/2 TABLET BY MOUTH DAILY, EXCEPT 1 TABLET ON Monday, Wednesday, AND Saturday OR AS DIRECTED BY ANTICOAGULATION CLINIC.     Allergies: Allergies as of 10/15/2018 - Review Complete 10/15/2018  Allergen Reaction Noted  . Oxycodone-acetaminophen Other (See Comments) 01/13/2018   Past Medical History:  Diagnosis Date  . Anemia   . Atrial fibrillation ([place])   . Chest pain with moderate risk for cardiac etiology 06/15/2018  . Chronic combined systolic and diastolic CHF (congestive heart failure) ([place])   . Diabetic foot ulcer ([place]) 09/03/2017  . Diabetic wet gangrene of the foot ([place]) 09/04/2017  . ESRD (end stage renal disease) on dialysis [facility])    [facility] [address], [place], [place] (06/15/2018)  . Headache    "monthly" (06/15/2018)  . HIV disease ([place])   . Hyperparathyroidism, secondary ([place])   . Hypertension   . Hypertensive heart disease with end stage renal disease on dialysis ([place]) 03/13/2017  . Pneumonia X 1  . Subacute osteomyelitis, right ankle and foot ([place])   . SVT (supraventricular tachycardia) ([place])    ? afib or atrial flutter s/p TEE/DCCV with subsequent ablation due to reoccurrence in [place]  . Type 2 diabetes mellitus ([facility])     Family History:  Family History  Problem Relation Age of Onset  . Heart disease Mother   . Diabetes Mother   . Heart disease Father   . Cancer Neg Hx    Unknown age of heart disease in mother and grandmother  Social History:  On disability, lives at home with wife. Has anniversary coming up on Sunday. Denies tobacco, EtOH, illicit substance use.  Review of Systems: A complete ROS was negative except as per HPI.   Physical Exam: Blood pressure 125/83, pulse 88, temperature 98.2 F (36.8 C), temperature source Oral, resp.  rate 18, height 6\' 2"  (1.88 m), weight 131.5 kg, SpO2 96 %. Physical Exam  Constitutional: He is oriented to person, place, and time and well-developed, well-nourished, and in no distress. No distress.  HENT:  Head: Normocephalic and atraumatic.  Mouth/Throat: Oropharynx is clear and moist. No oropharyngeal exudate.  Eyes: Pupils are equal, round, and reactive to light. Conjunctivae and EOM are normal. No scleral icterus.  Neck: Normal range of motion. Neck supple. No JVD present.  Cardiovascular: Normal rate and normal heart sounds.  No murmur heard. Irregularly irregular rhythm  Pulmonary/Chest: Effort normal and breath sounds normal. He has no wheezes. He has no rales. He exhibits no tenderness (chest wall non-tender to palpation).  Abdominal: Soft. Bowel sounds are normal. He exhibits distension. There is no tenderness.  Musculoskeletal: Normal range of motion. He exhibits deformity (bilateral BKA with prosthesis present).  He exhibits no edema or tenderness.  Neurological: He is alert and oriented to person, place, and time. GCS score is 15.  Skin: Skin is warm and dry. He is not diaphoretic.  Psychiatric: Mood, memory, affect and judgment normal.     EKG: personally reviewed my interpretation is sinus rhythm, normal axis, +APC, no ST elevation or depression, borderline prolonged QT @ ~438msec. No significant change from prior EKG.  CXR: personally reviewed my interpretation is poor penetration, normal mediastinum, no lobar consolidation, no pulm edema, no pleural effusion.  Assessment & Plan by Problem: Mr.Seiber is a 52 yo M w/ PMh of ESRD on dialysis [facility], A.fib on Coumadin, systolic/diastolic CHF (last echo 05-39%, grade 2 diastolic dysfx on 06/6733), CAD (2 vessel severe obstruction on Left heart cath 05/2018), HIV on Biktarvy, GERD, T2DM, and PAD s/p bilateral BKA presenting with typical chest pain. Mr.Gancarz has known coronary artery disease and multiple risk factors concerning for  ischemic event as he had experienced 2/3 diamond classification atypical chest pain at rest. However his troponins are at baseline on presentation and his EKG does not show significant ST changes. He appears to have experienced unstable angina but we will admit and trend troponins to rule out NSTEMI. He may benefit from a repeat cath as he is known to have severe stenosis of multiple vessels and he is at risk for embolic event due to his A.fib.   Atypical Chest pain 2/2 NSTEMI vs unstable angina vs GERD I-stat tropnin 0.11x2. No ST elevation or depression on EKG. Appear to have baseline elevated troponemia 2/2 ESRD. Severe 2 vessel stenosis coronary artery disease on 05/2018 cath. Last echo in 2018 show EF of 35-40% w/ grade 2 diastolic dysfunction. A.fib on amiodarone and metoprolol at home. Admit BP 97/79 - Trend troponins - Telemetry - Repeat EKG in AM - TTE (last echo in 2018) - Cards consult in AM for further work up/re-evaluation of his cardiac disease (possible repeat cath?) - Keep NPO at midnight   - C/w home meds: warfarin per pharm, metoprolol succinate 100mg  daily, amiodarone 200mg  daily, pantoprazole 80mg  daily  End stage renal disease on dialysis [facility] Dialysis this PM cut short 2/2 chest pain. Admit creatinine 7.22 post dialysis. K 4.2 - Will need nephro consult for inpatient dialysis if patient is not discharged before his next scheduled dialysis date - C/w home med: cinacalcet 90mg  daily  T2DM Last hgb A1c 7.7 on 08/2017. Admit bg 276 - glucose checks - Sliding scale aspart - Lantus 10 units qhs  Hx of HIV Undetectable viral load, CD4 610 on 01/2018 - C/w home med: Biktarvy 1tab daily  DVT prophx: warfarin Diet: Cardiac/Renal w/ fluid restrction. NPO at midnight Bowel: polycarbophil Code: Full  Dispo: Admit patient to Observation with expected length of stay less than 2 midnights.  Signed: Mosetta Anis, MD 10/15/2018, 8:37 PM  Pager: [phone]

## 2018-10-15 NOTE — Therapy (Signed)
[facility] [address] [place], [place], [address] Phone: [phone]   Fax:  [phone]  Physical Therapy Treatment  Patient Details  Name: Thomas [name] MRN: [id number] Date of Birth: [birth year] Referring Provider (PT): Meridee Score, MD   Encounter Date: 10/14/2018  PT End of Session - 10/14/18 1110    Visit Number  18    Number of Visits  26    Date for PT Re-Evaluation  08/14/18    Authorization Type  Medicare & Generic commercial    PT Start Time  1106   pt late for appt   PT Stop Time  1145    PT Time Calculation (min)  39 min    Equipment Utilized During Treatment  Gait belt    Activity Tolerance  Patient tolerated treatment well;Patient limited by pain;Patient limited by fatigue    Behavior During Therapy  [facility] for tasks assessed/performed       Past Medical History:  Diagnosis Date  . Anemia   . Atrial fibrillation ([place])   . Chest pain with moderate risk for cardiac etiology 06/15/2018  . Chronic combined systolic and diastolic CHF (congestive heart failure) ([place])   . Diabetic foot ulcer ([place]) 09/03/2017  . Diabetic wet gangrene of the foot ([place]) 09/04/2017  . ESRD (end stage renal disease) on dialysis [facility])    [place] [address] [place], [place], [place] (06/15/2018)  . Headache    "monthly" (06/15/2018)  . HIV disease ([place])   . Hyperparathyroidism, secondary ([place])   . Hypertension   . Hypertensive heart disease with end stage renal disease on dialysis ([place]) 03/13/2017  . Pneumonia X 1  . Subacute osteomyelitis, right ankle and foot ([place])   . SVT (supraventricular tachycardia) ([place])    ? afib or atrial flutter s/p TEE/DCCV with subsequent ablation due to reoccurrence in [place]  . Type 2 diabetes mellitus ([place])     Past Surgical History:  Procedure Laterality Date  . AMPUTATION Left    foot  . AMPUTATION Right 12/06/2017   Procedure: AMPUTATION BELOW KNEE;  Surgeon: [name], MD;  Location: [place];  Service:  Orthopedics;  Laterality: Right;  . APPLICATION OF WOUND VAC Right 09/04/2017   Procedure: APPLICATION OF WOUND VAC;  Surgeon: [name], DPM;  Location: [facility];  Service: Podiatry;  Laterality: Right;  . APPLICATION OF WOUND VAC  12/06/2017   Procedure: APPLICATION OF WOUND VAC;  Surgeon: [name], MD;  Location: [facility];  Service: Orthopedics;;  . AV FISTULA PLACEMENT Right 10/19/2012  . AV FISTULA REPAIR Right ~ 02/2018   "had it cleaned out"  . BELOW KNEE LEG AMPUTATION Left ~ 2016  . COLONOSCOPY WITH PROPOFOL N/A 07/30/2017   Procedure: COLONOSCOPY WITH PROPOFOL;  Surgeon: [name], MD;  Location: [facility];  Service: Endoscopy;  Laterality: N/A;  . ESOPHAGOGASTRODUODENOSCOPY (EGD) WITH PROPOFOL N/A 07/28/2017   Procedure: ESOPHAGOGASTRODUODENOSCOPY (EGD) WITH PROPOFOL;  Surgeon: [name], MD;  Location: [place];  Service: Endoscopy;  Laterality: N/A;  . ESOPHAGOGASTRODUODENOSCOPY (EGD) WITH PROPOFOL N/A 01/15/2018   Procedure: ESOPHAGOGASTRODUODENOSCOPY (EGD) WITH PROPOFOL;  Surgeon: [name], MD;  Location: [facility] ENDOSCOPY;  Service: Endoscopy;  Laterality: N/A;  . FLEXIBLE SIGMOIDOSCOPY N/A 07/27/2017   Procedure: FLEXIBLE SIGMOIDOSCOPY;  Surgeon: [name], MD;  Location: [facility] ENDOSCOPY;  Service: Endoscopy;  Laterality: N/A;  . GRAFT APPLICATION Right 89/21/1941   Procedure: SKIN GRAFT APPLICATION RIGHT FOOT;  Surgeon: [name], DPM;  Location:  [facility];  Service: Podiatry;  Laterality: Right;  . I&D EXTREMITY Right 09/04/2017   Procedure: IRRIGATION AND DEBRIDEMENT EXTREMITY;  Surgeon: [name], DPM;  Location: [facility];  Service: Podiatry;  Laterality: Right;  . I&D EXTREMITY Right 11/12/2017   Procedure: IRRIGATION AND DEBRIDEMENT ULCER RIGHT FOOT;  Surgeon: [name], DPM;  Location: [facility];  Service: Podiatry;  Laterality: Right;  . LEFT HEART CATH AND CORONARY ANGIOGRAPHY N/A 06/18/2018   Procedure: LEFT HEART CATH AND CORONARY  ANGIOGRAPHY;  Surgeon: [name], Peter M, MD;  Location: [place] [facility];  Service: Cardiovascular;  Laterality: N/A;  . WOUND DEBRIDEMENT N/A 09/24/2017   Procedure: DEBRIDEMENT WOUND;  Surgeon: [name], DPM;  Location: [facility];  Service: Podiatry;  Laterality: N/A;    There were no vitals filed for this visit.  Subjective Assessment - 10/14/18 1107    Subjective  Had a fall while in [place]. Was getting up in a hurry to get to bathroom. Left prosthesis clicked all the way, the right one did not. He fell on the right limb. Did not open anything. Still a little swollen. This is why he missed last couple appts. Has not seen the MD as well.. Did a lot of walking in [place], and did the band ex's. Has not been back to gym since getting back into town 2 weeks ago.    Pertinent History  R TTA, ESRD, DM2, HIV, cardiomyopathy, CHF, Tachycardia, A-Fib, Mallory-Weiss Tear, obesity    Limitations  Standing;Walking;House hold activities    Patient Stated Goals  To use bilateral prostheses to dance with wife & swimming,     Currently in Pain?  Yes    Pain Score  5     Pain Location  Leg    Pain Orientation  Right    Pain Descriptors / Indicators  Sore    Pain Type  Acute pain    Pain Onset  1 to 4 weeks ago    Pain Frequency  Constant    Aggravating Factors   recent fall on limb    Pain Relieving Factors  rest, removing prosthesis             [facility] Adult PT Treatment/Exercise - 10/14/18 1113      High Level Balance   High Level Balance Activities  Side stepping;Backward walking;Marching forwards    High Level Balance Comments  on blue mat in parallel bars: 3 laps each/each way with min guard to min assist for balance. cues on posture, ex form/technique and weight shifting for balance assist.       Knee/Hip Exercises: Aerobic   Other Aerobic  Scifit LE/UE's level 2.5 for 6 minutes, rest, then 6 addional mintues, goal >/=55 rpm for strengthening and activity tolerance                    Prosthetics   Current prosthetic wear tolerance (days/week)   daily    Current prosthetic wear tolerance (#hours/day)   most of awake hours including dialysis, removes for naps after dialysis    Residual limb condition   left intact per pt. visually looked at right leg, no injury noted from fall, no bruising. continues with dry scabs along incision line.           Balance Exercises - 10/14/18 1134      Balance Exercises: Standing   Balance Beam  standing across blue foam beam: fwd stepping to floor/back onto beam, then bwd steping  to floor/back onto beam, bil light fingertip support for balance. cues for step length and step height    Other Standing Exercises  in parallel bars with light UE support: fwd step up on airex with contralateral march, alternating legs x 7-8 reps each side. min guard assist for safety with cues on form and technique.           PT Short Term Goals - 09/23/18 2135      PT SHORT TERM GOAL #1   Title  Patient verbalizes proper adjustment of ply socks with limb volume changes. (All STGs Target Date: 09/11/2018)    Baseline  MET 09/23/2018    Time  4    Period  Weeks    Status  Achieved      PT SHORT TERM GOAL #2   Title  Patient ambulates 200' negotiating around obstacles carrying plate of objects safely with verbal cues.     Baseline  MET 09/23/2018    Time  4    Period  Weeks    Status  Achieved      PT SHORT TERM GOAL #3   Title  Patient reaches to floor with cane to retrieve 3# object with supervision.    Baseline  MET 09/23/2018    Time  4    Period  Weeks    Status  Achieved      PT SHORT TERM GOAL #4   Title  Patient ambulates 750' with cane outdoors including 100' on grass with supervision.     Baseline  MET 09/23/2018    Time  4    Period  Weeks    Status  Achieved      PT SHORT TERM GOAL #5   Title  Patient negotiates curb with cane & prostheses with supervision.    Baseline  patient improved curb but requires light touch on car hood or  PT hand    Time  4    Period  Weeks    Status  Partially Met        PT Long Term Goals - 10/14/18 1110      PT LONG TERM GOAL #1   Title  Patient verbalizes & demonstrates proper prosthetic care including problem solving advanced issues to enable safe use of bilateral Transtibial prostheses. (All LTGs Target Date: 11/06/2018)    Baseline        Time  12    Period  Weeks    Status  On-going      PT LONG TERM GOAL #2   Title  Patient tolerates wear of bilateral Transtibial prostheses >90% of awake hours without skin issues or limb pain to enable function during most of his day.     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     Baseline        Time  12    Period  Weeks    Status  On-going      PT  LONG TERM GOAL #5   Title  Patient ambulates 200' around furniture carrying plate or cup with prostheses only modified independent for household mobility.     Baseline       Time  12    Period  Weeks    Status  On-going      PT LONG TERM GOAL #6   Title  Patient negotiates ramps, curbs & stairs (1 rail) with cane or less & prostheses modified independent for community access.     Baseline        Time  12    Period  Weeks    Status  On-going      PT LONG TERM GOAL #7   Title  6 Minute Walk Test with cane & prostheses >900'    Baseline       Time  12    Period  Weeks    Status  On-going            Plan - 10/14/18 1110    Clinical Impression Statement  Today's skilled session focues on activity tolerance and balance reactions with rest breaks needed due to mild increase in back pain with activity. Back pain resolved to base line levels with each seated rest  break. The pt is progressing toward goals and should benefit from continued PT to progress toward unmet goals     Rehab Potential  Good    PT Frequency  1x / week    PT Duration  12 weeks    PT Treatment/Interventions  ADLs/Self Care Home Management;Canalith Repostioning;DME Instruction;Gait training;Stair training;Functional mobility training;Therapeutic activities;Therapeutic exercise;Balance training;Patient/family education;Prosthetic Training;Vestibular;Manual techniques    PT Next Visit Plan  work towards [place],        Patient will benefit from skilled therapeutic intervention in order to improve the following deficits and impairments:  Abnormal gait, Cardiopulmonary status limiting activity, Decreased activity tolerance, Decreased balance, Decreased endurance, Decreased mobility, Decreased knowledge of use of DME, Decreased range of motion, Decreased scar mobility, Decreased strength, Postural dysfunction, Prosthetic Dependency  Visit Diagnosis: Unsteadiness on feet  Other abnormalities of gait and mobility  Muscle weakness (generalized)  Abnormal posture  History of falling     Problem List Patient Active Problem List   Diagnosis Date Noted  . Non-ST elevation (NSTEMI) myocardial infarction ([place])   . Persistent atrial fibrillation with rapid ventricular response 06/15/2018  . Chest pain with moderate risk for cardiac etiology 06/15/2018  . Atrial flutter ([place]) 06/15/2018  . Somniloquy 06/12/2018  . Encounter for therapeutic drug monitoring 05/27/2018  . Chronic systolic heart failure ([place])   . Demand ischemia ([place])   . Atrial fibrillation with RVR ([place]) 05/02/2018  . Murmur, cardiac 02/13/2018  . Mallory-Weiss tear 01/16/2018  . Morbid obesity ([place])   . Fall   . Subtherapeutic international normalized ratio (INR)   . History of left below knee amputation ([place]) 12/11/2017  . History of supraventricular tachycardia   . S/P bilateral BKA (below knee amputation) ([place])    . Anemia of chronic disease   . Leukocytosis   . Non-ischemic cardiomyopathy ([place])   . Hypokalemia 09/04/2017  . Essential hypertension 09/03/2017  . GERD (gastroesophageal reflux disease) 09/03/2017  . GIB (gastrointestinal bleeding) 07/26/2017  . Anemia due to end stage renal disease ([place]) 07/26/2017  . PAF (paroxysmal atrial fibrillation) ([place]) 07/26/2017  . Symptomatic anemia 07/26/2017  . End stage renal disease ([place]) 05/05/2017  . HIV disease ([place]) 03/25/2017  . Hypertensive heart disease with end stage  renal disease on dialysis ([place]) 03/13/2017  . ESRD on dialysis ([place]) 03/13/2017  . Type 2 diabetes mellitus with complication ([place]) 57/90/3833  . SVT (supraventricular tachycardia) ([place])     Willow Ora, PTA, [facility] [address], [place], [place] [address] [phone] 10/15/18, 1:33 PM   Name: Thomas [name] MRN: [id number] Date of Birth: 06/26/1966

## 2018-10-15 NOTE — ED Notes (Signed)
Patient back to ER room from xray

## 2018-10-15 NOTE — ED Provider Notes (Signed)
[facility] EMERGENCY DEPARTMENT Provider Note   CSN: [id number] Arrival date & time: 10/15/18  1558     History   Chief Complaint Chief Complaint  Patient presents with  . Chest Pain    HPI Thomas [name] is a 52 y.o. male.  Pt presents to the ED today with CP.  Pt said he woke up from sleep around 0500 with cp and sob.  It lasted a few minutes and went away.  He went to dialysis, and with about 1 hr 10 min left, he said he broke out in a sweat and developed cp.  He had nausea as well.  Pt said cp is gone.  His bp was low, so he did not get any nitro by EMS. Pt has a hx of a.fib and is on coumadin.  Pt also reports that his bp dropped into the 70s while in dialysis.  CHA2DS2/VAS Stroke Risk Points  Current as of just now     4 >= 2 Points: High Risk  1 - 1.99 Points: Medium Risk  0 Points: Low Risk    The previous score was 3 on 12/05/2017.:  Last Change:     Details    This score determines the patient's risk of having a stroke if the  patient has atrial fibrillation.       Points Metrics  1 Has Congestive Heart Failure:  Yes    Current as of just now  1 Has Vascular Disease:  Yes    Current as of just now  1 Has Hypertension:  Yes    Current as of just now  0 Age:  28    Current as of just now  1 Has Diabetes:  Yes    Current as of just now  0 Had Stroke:  No  Had TIA:  No  Had thromboembolism:  No    Current as of just now  0 Male:  No    Current as of just now              Past Medical History:  Diagnosis Date  . Anemia   . Atrial fibrillation ([place])   . Chest pain with moderate risk for cardiac etiology 06/15/2018  . Chronic combined systolic and diastolic CHF (congestive heart failure) ([place])   . Diabetic foot ulcer ([place]) 09/03/2017  . Diabetic wet gangrene of the foot ([place]) 09/04/2017  . ESRD (end stage renal disease) on dialysis [facility])    [place] [address] [place], [place], [place] (06/15/2018)  . Headache    "monthly" (06/15/2018)  . HIV  disease ([place])   . Hyperparathyroidism, secondary ([place])   . Hypertension   . Hypertensive heart disease with end stage renal disease on dialysis ([place]) 03/13/2017  . Pneumonia X 1  . Subacute osteomyelitis, right ankle and foot ([place])   . SVT (supraventricular tachycardia) ([place])    ? afib or atrial flutter s/p TEE/DCCV with subsequent ablation due to reoccurrence in [place]  . Type 2 diabetes mellitus [facility])     Patient Active Problem List   Diagnosis Date Noted  . Non-ST elevation (NSTEMI) myocardial infarction ([place])   . Persistent atrial fibrillation with rapid ventricular response 06/15/2018  . Chest pain with moderate risk for cardiac etiology 06/15/2018  . Atrial flutter ([place]) 06/15/2018  . Somniloquy 06/12/2018  . Encounter for therapeutic drug monitoring 05/27/2018  . Chronic systolic heart failure ([place])   . Demand ischemia ([place])   . Atrial fibrillation with RVR (  [place]) 05/02/2018  . Murmur, cardiac 02/13/2018  . Mallory-Weiss tear 01/16/2018  . Morbid obesity ([employer])   . Fall   . Subtherapeutic international normalized ratio (INR)   . History of left below knee amputation ([place]) 12/11/2017  . History of supraventricular tachycardia   . S/P bilateral BKA (below knee amputation) ([place])   . Anemia of chronic disease   . Leukocytosis   . Non-ischemic cardiomyopathy ([place])   . Hypokalemia 09/04/2017  . Essential hypertension 09/03/2017  . GERD (gastroesophageal reflux disease) 09/03/2017  . GIB (gastrointestinal bleeding) 07/26/2017  . Anemia due to end stage renal disease ([place]) 07/26/2017  . PAF (paroxysmal atrial fibrillation) ([place]) 07/26/2017  . Symptomatic anemia 07/26/2017  . End stage renal disease ([place]) 05/05/2017  . HIV disease ([place]) 03/25/2017  . Hypertensive heart disease with end stage renal disease on dialysis ([place]) 03/13/2017  . ESRD on dialysis ([place]) 03/13/2017  . Type 2 diabetes mellitus with complication ([place]) 55/73/2202  . SVT (supraventricular tachycardia) ([facility])      Past Surgical History:  Procedure Laterality Date  . AMPUTATION Left    foot  . AMPUTATION Right 12/06/2017   Procedure: AMPUTATION BELOW KNEE;  Surgeon: [name], MD;  Location: [facility];  Service: Orthopedics;  Laterality: Right;  . APPLICATION OF WOUND VAC Right 09/04/2017   Procedure: APPLICATION OF WOUND VAC;  Surgeon: [name], DPM;  Location: [employer];  Service: Podiatry;  Laterality: Right;  . APPLICATION OF WOUND VAC  12/06/2017   Procedure: APPLICATION OF WOUND VAC;  Surgeon: [name], MD;  Location: [place];  Service: Orthopedics;;  . AV FISTULA PLACEMENT Right 10/19/2012  . AV FISTULA REPAIR Right ~ 02/2018   "had it cleaned out"  . BELOW KNEE LEG AMPUTATION Left ~ 2016  . COLONOSCOPY WITH PROPOFOL N/A 07/30/2017   Procedure: COLONOSCOPY WITH PROPOFOL;  Surgeon: [name], MD;  Location: [place];  Service: Endoscopy;  Laterality: N/A;  . ESOPHAGOGASTRODUODENOSCOPY (EGD) WITH PROPOFOL N/A 07/28/2017   Procedure: ESOPHAGOGASTRODUODENOSCOPY (EGD) WITH PROPOFOL;  Surgeon: [name], MD;  Location: [place];  Service: Endoscopy;  Laterality: N/A;  . ESOPHAGOGASTRODUODENOSCOPY (EGD) WITH PROPOFOL N/A 01/15/2018   Procedure: ESOPHAGOGASTRODUODENOSCOPY (EGD) WITH PROPOFOL;  Surgeon: [name], MD;  Location: [facility] ENDOSCOPY;  Service: Endoscopy;  Laterality: N/A;  . FLEXIBLE SIGMOIDOSCOPY N/A 07/27/2017   Procedure: FLEXIBLE SIGMOIDOSCOPY;  Surgeon: [name], MD;  Location: [facility] ENDOSCOPY;  Service: Endoscopy;  Laterality: N/A;  . GRAFT APPLICATION Right 54/27/0623   Procedure: SKIN GRAFT APPLICATION RIGHT FOOT;  Surgeon: [name], DPM;  Location: [place];  Service: Podiatry;  Laterality: Right;  . I&D EXTREMITY Right 09/04/2017   Procedure: IRRIGATION AND DEBRIDEMENT EXTREMITY;  Surgeon: [name], DPM;  Location: [place];  Service: Podiatry;  Laterality: Right;  . I&D EXTREMITY Right 11/12/2017   Procedure: IRRIGATION AND DEBRIDEMENT ULCER  RIGHT FOOT;  Surgeon: [name], DPM;  Location: [place];  Service: Podiatry;  Laterality: Right;  . LEFT HEART CATH AND CORONARY ANGIOGRAPHY N/A 06/18/2018   Procedure: LEFT HEART CATH AND CORONARY ANGIOGRAPHY;  Surgeon: [name], Peter M, MD;  Location: [place] CV LAB;  Service: Cardiovascular;  Laterality: N/A;  . WOUND DEBRIDEMENT N/A 09/24/2017   Procedure: DEBRIDEMENT WOUND;  Surgeon: [name], DPM;  Location: [place];  Service: Podiatry;  Laterality: N/A;        Home Medications    Prior to Admission medications   Medication Sig Start Date End  Date Taking? Authorizing Provider  amiodarone (PACERONE) 200 MG tablet Take 1 tablet (200 mg total) by mouth daily. 08/05/18   Lyda Jester M, PA-C  bictegravir-emtricitabine-tenofovir AF (BIKTARVY) 50-200-25 MG TABS tablet Take 1 tablet by mouth daily. 03/02/18   Carlyle Basques, MD  camphor-menthol [facility]) lotion Apply topically as needed for itching. 12/11/17   Colbert Ewing, MD  cinacalcet (SENSIPAR) 90 MG tablet Take 90 mg by mouth daily. 04/27/18   [provider]  ferric gluconate 125 mg in sodium chloride 0.9 % 100 mL Inject 125 mg into the vein Every Tuesday,Thursday,and Saturday with dialysis. 05/10/17   Hosie Poisson, MD  gabapentin (NEURONTIN) 100 MG capsule Take 1 capsule (100 mg total) by mouth every dialysis. 01/09/18   Jamse Arn, MD  insulin glargine (LANTUS) 100 unit/mL SOPN Inject 0.3 mLs (30 Units total) into the skin at bedtime. Patient taking differently: Inject 20 Units into the skin at bedtime.  12/19/17   Angiulli, Lavon Paganini, PA-C  metoprolol succinate (TOPROL-XL) 100 MG 24 hr tablet TAKE 1 TABLET BY MOUTH ONCE DAILY TAKE WITH OR IMMEDIATELY FOLLOWING A MEAL 08/05/18   Rosita Fire, Brittainy M, PA-C  pantoprazole (PROTONIX) 40 MG tablet Take 2 tablets (80 mg total) by mouth daily. 02/16/18   Alphonzo Grieve, MD  polycarbophil (FIBERCON) 625 MG tablet Take 1 tablet (625 mg total) by mouth daily.  12/19/17   Angiulli, Lavon Paganini, PA-C  rosuvastatin (CRESTOR) 20 MG tablet TAKE 1 TABLET BY MOUTH ONCE DAILY AT [facility] 08/19/18   [name], MD  sevelamer carbonate (RENVELA) 800 MG tablet Take 800 mg by mouth 3 (three) times daily with meals.  01/10/18   [provider]  warfarin (COUMADIN) 7.5 MG tablet TAKE 1/2 TABLET BY MOUTH DAILY, EXCEPT 1 TABLET ON Monday, Wednesday, AND Saturday OR AS DIRECTED BY ANTICOAGULATION CLINIC. 10/07/18   Sueanne Margarita, MD    Family History Family History  Problem Relation Age of Onset  . Heart disease Mother   . Diabetes Mother   . Heart disease Father   . Cancer Neg Hx     Social History Social History   Tobacco Use  . Smoking status: Never Smoker  . Smokeless tobacco: Never Used  Substance Use Topics  . Alcohol use: Never    Frequency: Never  . Drug use: Never     Allergies   Oxycodone-acetaminophen   Review of Systems Review of Systems  Respiratory: Positive for shortness of breath.   Cardiovascular: Positive for chest pain.  All other systems reviewed and are negative.    Physical Exam Updated Vital Signs BP 97/79   Pulse 88   Temp 97.8 F (36.6 C) (Oral)   Resp 16   Ht 6\' 2"  (1.88 m)   Wt 131.5 kg   SpO2 96%   BMI 37.23 kg/m   Physical Exam  Constitutional: He is oriented to person, place, and time. He appears well-developed and well-nourished.  HENT:  Head: Normocephalic and atraumatic.  Eyes: Pupils are equal, round, and reactive to light. EOM are normal.  Neck: Normal range of motion. Neck supple.  Cardiovascular: Normal rate, intact distal pulses and normal pulses. An irregularly irregular rhythm present.  Pulmonary/Chest: Effort normal and breath sounds normal.  Abdominal: Soft. Bowel sounds are normal.  Musculoskeletal: Normal range of motion.       Right lower leg: Normal.       Left lower leg: Normal.  Bilateral bka  Neurological: He is alert and oriented to person,  place, and time.  Skin: Skin  is warm and dry. Capillary refill takes less than 2 seconds.  Psychiatric: He has a normal mood and affect. His behavior is normal.  Nursing note and vitals reviewed.    ED Treatments / Results  Labs (all labs ordered are listed, but only abnormal results are displayed) Labs Reviewed  BASIC METABOLIC PANEL - Abnormal; Notable for the following components:      Result Value   Chloride 95 (*)    Glucose, Bld 276 (*)    BUN 26 (*)    Creatinine, Ser 7.22 (*)    GFR calc non Af Amer 8 (*)    GFR calc Af Amer 9 (*)    Anion gap 16 (*)    All other components within normal limits  CBC - Abnormal; Notable for the following components:   RBC 3.88 (*)    Hemoglobin 12.5 (*)    HCT 37.4 (*)    All other components within normal limits  PROTIME-INR - Abnormal; Notable for the following components:   Prothrombin Time 22.7 (*)    All other components within normal limits  I-STAT TROPONIN, ED - Abnormal; Notable for the following components:   Troponin i, poc 0.11 (*)    All other components within normal limits  I-STAT TROPONIN, ED    EKG EKG Interpretation  Date/Time:  Thursday October 15 2018 15:58:46 EDT Ventricular Rate:  88 PR Interval:    QRS Duration: 106 QT Interval:  435 QTC Calculation: 527 R Axis:   41 Text Interpretation:  Sinus rhythm Atrial premature complexes Prolonged PR interval Prolonged QT interval No significant change since last tracing Confirmed by Isla Pence [id number]) on 10/15/2018 4:21:40 PM   Radiology Dg Chest 2 View  Result Date: 10/15/2018 CLINICAL DATA:  Chest pain and shortness of breath beginning today. Recent dialysis. EXAM: CHEST - 2 VIEW COMPARISON:  06/15/2018 FINDINGS: The heart size and mediastinal contours are within normal limits. Both lungs are clear. The visualized skeletal structures are unremarkable. IMPRESSION: No active cardiopulmonary disease. Electronically Signed   By: Earle Gell M.D.   On: 10/15/2018 17:44     Procedures Procedures (including critical care time)  Medications Ordered in ED Medications  sodium chloride 0.9 % bolus 500 mL (has no administration in time range)  acetaminophen (TYLENOL) tablet 1,000 mg (1,000 mg Oral Given 10/15/18 1727)     Initial Impression / Assessment and Plan / ED Course  I have reviewed the triage vital signs and the nursing notes.  Pertinent labs & imaging results that were available during my care of the patient were reviewed by me and considered in my medical decision making (see chart for details).    Pt has a heart score of 5.  No current CP. He was d/w IMTS for admission for observation rule out.    Final Clinical Impressions(s) / ED Diagnoses   Final diagnoses:  Chest pain, unspecified type  ESRD on hemodialysis [facility])  Atrial fibrillation, controlled [facility])    ED Discharge Orders    None       Isla Pence, MD 10/15/18 1858

## 2018-10-15 NOTE — Progress Notes (Signed)
ANTICOAGULATION CONSULT NOTE - Initial Consult  Pharmacy Consult for warfarin Indication: atrial fibrillation  Allergies  Allergen Reactions  . Oxycodone-Acetaminophen Other (See Comments)    Hallucinations/delirum    Patient Measurements: Height: 6\' 2"  (188 cm) Weight: 290 lb (131.5 kg) IBW/kg (Calculated) : 82.2  Vital Signs: Temp: 98.2 F (36.8 C) (10/17 2004) Temp Source: Oral (10/17 2004) BP: 125/83 (10/17 2004) Pulse Rate: 88 (10/17 2004)  Labs: Recent Labs    10/15/18 1623  HGB 12.5*  HCT 37.4*  PLT 232  LABPROT 22.7*  INR 2.03  CREATININE 7.22*    Estimated Creatinine Clearance: 17.2 mL/min (A) (by C-G formula based on SCr of 7.22 mg/dL (H)).   Medical History: Past Medical History:  Diagnosis Date  . Anemia   . Atrial fibrillation ([place])   . Chest pain with moderate risk for cardiac etiology 06/15/2018  . Chronic combined systolic and diastolic CHF (congestive heart failure) ([place])   . Diabetic foot ulcer ([place]) 09/03/2017  . Diabetic wet gangrene of the foot ([place]) 09/04/2017  . ESRD (end stage renal disease) on dialysis [facility])    [place] [address] T, [place], [place] (06/15/2018)  . Headache    "monthly" (06/15/2018)  . HIV disease ([place])   . Hyperparathyroidism, secondary ([place])   . Hypertension   . Hypertensive heart disease with end stage renal disease on dialysis ([place]) 03/13/2017  . Pneumonia X 1  . Subacute osteomyelitis, right ankle and foot ([place])   . SVT (supraventricular tachycardia) ([place])    ? afib or atrial flutter s/p TEE/DCCV with subsequent ablation due to reoccurrence in [place]  . Type 2 diabetes mellitus ([facility])     Medications:  Medications Prior to Admission  Medication Sig Dispense Refill Last Dose  . amiodarone (PACERONE) 200 MG tablet Take 1 tablet (200 mg total) by mouth daily. 30 tablet 11 10/14/2018 at Unknown time  . bictegravir-emtricitabine-tenofovir AF (BIKTARVY) 50-200-25 MG TABS tablet Take 1 tablet by mouth daily. 30 tablet 11 10/15/2018  at Unknown time  . camphor-menthol (SARNA) lotion Apply topically as needed for itching. 222 mL 0 unk at prn  . cinacalcet (SENSIPAR) 90 MG tablet Take 90 mg by mouth daily.  6 10/15/2018 at Unknown time  . ferric gluconate 125 mg in sodium chloride 0.9 % 100 mL Inject 125 mg into the vein Every Tuesday,Thursday,and Saturday with dialysis.   10/15/2018 at Unknown time  . gabapentin (NEURONTIN) 100 MG capsule Take 1 capsule (100 mg total) by mouth every dialysis. 15 capsule 1 10/15/2018 at Unknown time  . insulin glargine (LANTUS) 100 unit/mL SOPN Inject 0.3 mLs (30 Units total) into the skin at bedtime. (Patient taking differently: Inject 20 Units into the skin at bedtime. ) 15 mL 11 10/14/2018 at Unknown time  . metoprolol succinate (TOPROL-XL) 100 MG 24 hr tablet TAKE 1 TABLET BY MOUTH ONCE DAILY TAKE WITH OR IMMEDIATELY FOLLOWING A MEAL 90 tablet 1 10/14/2018 at Unknown time  . pantoprazole (PROTONIX) 40 MG tablet Take 2 tablets (80 mg total) by mouth daily. 60 tablet 1 10/15/2018 at Unknown time  . polycarbophil (FIBERCON) 625 MG tablet Take 1 tablet (625 mg total) by mouth daily. 30 tablet 0 10/15/2018 at Unknown time  . warfarin (COUMADIN) 7.5 MG tablet TAKE 1/2 TABLET BY MOUTH DAILY, EXCEPT 1 TABLET ON Monday, Wednesday, AND Saturday OR AS DIRECTED BY ANTICOAGULATION CLINIC. 90 tablet 0 10/14/2018 at 930pm  . rosuvastatin (CRESTOR) 20 MG tablet TAKE 1 TABLET BY MOUTH ONCE DAILY AT  6PM (Patient not taking: Reported on 10/15/2018) 90 tablet 1 Not Taking at Unknown time    Assessment: 46 yoM with history of Afib on warfarin PTA. Pt admitted for CP and SOB. INR currently therapeutic at 2.03. Hgb 12.5, Plt 232  PTA warfarin dose: 7.5 mg on Mon, Wed, Sat and 3.75 mg AOD  Goal of Therapy:  INR 2-3 Monitor platelets by anticoagulation protocol: Yes   Plan:  Continue warfarin 7.5 mg on Mon, Wed, Sat and 3.75 mg all other days Monitor daily INR, CBC, clinical course, s/sx of bleed, PO intake,  DDI   Thank you for allowing [place] to participate in this patients care.   Jens Som, PharmD Please utilize Amion (under [place]) for appropriate number for your unit pharmacist. 10/15/2018 8:51 PM

## 2018-10-16 ENCOUNTER — Observation Stay (HOSPITAL_BASED_OUTPATIENT_CLINIC_OR_DEPARTMENT_OTHER): Payer: Medicare Other

## 2018-10-16 DIAGNOSIS — N186 End stage renal disease: Secondary | ICD-10-CM

## 2018-10-16 DIAGNOSIS — Z992 Dependence on renal dialysis: Secondary | ICD-10-CM

## 2018-10-16 DIAGNOSIS — Z79899 Other long term (current) drug therapy: Secondary | ICD-10-CM

## 2018-10-16 DIAGNOSIS — I25110 Atherosclerotic heart disease of native coronary artery with unstable angina pectoris: Secondary | ICD-10-CM | POA: Diagnosis not present

## 2018-10-16 DIAGNOSIS — E118 Type 2 diabetes mellitus with unspecified complications: Secondary | ICD-10-CM | POA: Diagnosis not present

## 2018-10-16 DIAGNOSIS — I440 Atrioventricular block, first degree: Secondary | ICD-10-CM

## 2018-10-16 DIAGNOSIS — Z89512 Acquired absence of left leg below knee: Secondary | ICD-10-CM

## 2018-10-16 DIAGNOSIS — I132 Hypertensive heart and chronic kidney disease with heart failure and with stage 5 chronic kidney disease, or end stage renal disease: Secondary | ICD-10-CM | POA: Diagnosis not present

## 2018-10-16 DIAGNOSIS — Z885 Allergy status to narcotic agent status: Secondary | ICD-10-CM

## 2018-10-16 DIAGNOSIS — K219 Gastro-esophageal reflux disease without esophagitis: Secondary | ICD-10-CM

## 2018-10-16 DIAGNOSIS — Z89511 Acquired absence of right leg below knee: Secondary | ICD-10-CM

## 2018-10-16 DIAGNOSIS — Z7901 Long term (current) use of anticoagulants: Secondary | ICD-10-CM

## 2018-10-16 DIAGNOSIS — I2510 Atherosclerotic heart disease of native coronary artery without angina pectoris: Secondary | ICD-10-CM

## 2018-10-16 DIAGNOSIS — R0789 Other chest pain: Secondary | ICD-10-CM | POA: Diagnosis not present

## 2018-10-16 DIAGNOSIS — I25119 Atherosclerotic heart disease of native coronary artery with unspecified angina pectoris: Secondary | ICD-10-CM

## 2018-10-16 DIAGNOSIS — Z9861 Coronary angioplasty status: Secondary | ICD-10-CM

## 2018-10-16 DIAGNOSIS — Z794 Long term (current) use of insulin: Secondary | ICD-10-CM

## 2018-10-16 DIAGNOSIS — Z21 Asymptomatic human immunodeficiency virus [HIV] infection status: Secondary | ICD-10-CM

## 2018-10-16 DIAGNOSIS — I4891 Unspecified atrial fibrillation: Secondary | ICD-10-CM

## 2018-10-16 DIAGNOSIS — E1122 Type 2 diabetes mellitus with diabetic chronic kidney disease: Secondary | ICD-10-CM | POA: Diagnosis not present

## 2018-10-16 DIAGNOSIS — I5042 Chronic combined systolic (congestive) and diastolic (congestive) heart failure: Secondary | ICD-10-CM | POA: Diagnosis not present

## 2018-10-16 DIAGNOSIS — E1151 Type 2 diabetes mellitus with diabetic peripheral angiopathy without gangrene: Secondary | ICD-10-CM

## 2018-10-16 DIAGNOSIS — I5040 Unspecified combined systolic (congestive) and diastolic (congestive) heart failure: Secondary | ICD-10-CM

## 2018-10-16 LAB — PROTIME-INR
INR: 2.33
PROTHROMBIN TIME: 25.2 s — AB (ref 11.4–15.2)

## 2018-10-16 LAB — GLUCOSE, CAPILLARY
GLUCOSE-CAPILLARY: 407 mg/dL — AB (ref 70–99)
GLUCOSE-CAPILLARY: 234 mg/dL — AB (ref 70–99)
Glucose-Capillary: 421 mg/dL — ABNORMAL HIGH (ref 70–99)
Glucose-Capillary: 369 mg/dL — ABNORMAL HIGH (ref 70–99)
Glucose-Capillary: 237 mg/dL — ABNORMAL HIGH (ref 70–99)

## 2018-10-16 LAB — ECHOCARDIOGRAM COMPLETE
Height: 74 in
WEIGHTICAEL: 4640 [oz_av]

## 2018-10-16 LAB — HEMOGLOBIN A1C
Hgb A1c MFr Bld: 11.4 % — ABNORMAL HIGH (ref 4.8–5.6)
Mean Plasma Glucose: 280.48 mg/dL

## 2018-10-16 LAB — MRSA PCR SCREENING: MRSA BY PCR: NEGATIVE

## 2018-10-16 LAB — TROPONIN I: Troponin I: 0.08 ng/mL (ref <0.03)

## 2018-10-16 MED ORDER — INSULIN ASPART 100 UNIT/ML ~~LOC~~ SOLN
0.0000 [IU] | Freq: Three times a day (TID) | SUBCUTANEOUS | Status: DC
  Administered 2018-10-16: 5 [IU] via SUBCUTANEOUS
  Filled 2018-10-16: qty 1

## 2018-10-16 MED ORDER — INSULIN ASPART 100 UNIT/ML ~~LOC~~ SOLN: 0.0000 [IU] | Freq: Every day | SUBCUTANEOUS | Status: DC

## 2018-10-16 MED ORDER — WARFARIN SODIUM 7.5 MG PO TABS
3.7500 mg | ORAL_TABLET | ORAL | Status: DC
  Filled 2018-10-16: qty 0.5

## 2018-10-16 MED FILL — BIKTARVY 50-200-25 MG TABS: 50-200-25 | 30 days supply | Qty: 30 | Fill #8

## 2018-10-16 NOTE — Progress Notes (Signed)
  Echocardiogram 2D Echocardiogram has been performed.  Jannett Celestine 10/16/2018, 9:44 AM

## 2018-10-16 NOTE — Discharge Summary (Signed)
Name: Thomas [name] MRN: [id number] DOB: Nov 05, 1966 52 y.o. PCP: Thomas Homes, MD  Date of Admission: 10/15/2018  3:58 PM Date of Discharge: 10/18/201910/18/2019 Attending Physician: No att. providers found  Discharge Diagnosis: 1. Atypical Chest Pain 2. Type II DM  Discharge Medications: Allergies as of 10/16/2018      Reactions   Oxycodone-acetaminophen Other (See Comments)   Hallucinations/delirum      Medication List    TAKE these medications   amiodarone 200 MG tablet Commonly known as:  PACERONE Take 1 tablet (200 mg total) by mouth daily.   bictegravir-emtricitabine-tenofovir AF 50-200-25 MG Tabs tablet Commonly known as:  BIKTARVY Take 1 tablet by mouth daily.   camphor-menthol lotion Commonly known as:  SARNA Apply topically as needed for itching.   cinacalcet 90 MG tablet Commonly known as:  SENSIPAR Take 90 mg by mouth daily.   ferric gluconate 125 mg in sodium chloride 0.9 % 100 mL Inject 125 mg into the vein Every Tuesday,Thursday,and Saturday with dialysis.   gabapentin 100 MG capsule Commonly known as:  NEURONTIN Take 1 capsule (100 mg total) by mouth every dialysis.   insulin glargine 100 unit/mL Sopn Commonly known as:  LANTUS Inject 0.3 mLs (30 Units total) into the skin at bedtime. What changed:  how much to take   metoprolol succinate 100 MG 24 hr tablet Commonly known as:  TOPROL-XL TAKE 1 TABLET BY MOUTH ONCE DAILY TAKE WITH OR IMMEDIATELY FOLLOWING A MEAL   pantoprazole 40 MG tablet Commonly known as:  PROTONIX Take 2 tablets (80 mg total) by mouth daily.   polycarbophil 625 MG tablet Commonly known as:  FIBERCON Take 1 tablet (625 mg total) by mouth daily.   rosuvastatin 20 MG tablet Commonly known as:  CRESTOR TAKE 1 TABLET BY MOUTH ONCE DAILY AT 6PM   warfarin 7.5 MG tablet Commonly known as:  COUMADIN Take as directed. If you are unsure how to take this medication, talk to your nurse or doctor. Original instructions:   TAKE 1/2 TABLET BY MOUTH DAILY, EXCEPT 1 TABLET ON Monday, Wednesday, AND Saturday OR AS DIRECTED BY ANTICOAGULATION CLINIC.       Disposition and follow-up:   [name] was discharged from [facility] in Stable condition.  At the hospital follow up visit please address:  1.  Atypical chest pain- reassess patient's chest pain and his plans to follow-up with cardiology  2.  Uncontrolled type 2 diabetes- hemoglobin A1c was 11.4, reassess diabetes medication regimen  3.  Labs / imaging needed at time of follow-up: None  4.  Pending labs/ test needing follow-up: None  Follow-up Appointments:   Patient to follow-up at internal medicine clinic as soon as possible Cardiology follow on 11/4  Hospital Course by problem list: 1.  Atypical chest pain-patient presented to the ED after 2 episodes of chest pain.  First episode woke him up at 5 AM and resolved on its own 20 minutes later.  Second episode occurred during hemodialysis where he became hypotensive and then started experiencing similar chest pain.  In the ED his troponins elevated at 0.08 however this was lower than his baseline.  Repeat EKG showed no ST segment changes.  He denied any more chest pain during admission. recently had a cath done in June 2019 which showed severe 2 vessel stenosis coronary artery disease.  Denied repeat cath at this time.  Cardiology recommended coronary angiography outpatient can be discussed at his follow-up cardiology appointment.  2.  Type  2 diabetes- hemoglobin A1c was 11.4.  Unontrolled with his home medications of Lantus 30 units.  Recommended close follow-up with PCP.   Discharge Vitals:   BP 138/85 (BP Location: Left Arm)   Pulse 80   Temp 97.6 F (36.4 C) (Oral)   Resp 16   Ht 6\' 2"  (1.88 m)   Wt 131.5 kg   SpO2 95%   BMI 37.23 kg/m   Pertinent Labs, Studies, and Procedures:   CBC Latest Ref Rng & Units 10/15/2018 06/19/2018 06/18/2018  WBC 4.0 - 10.5 K/uL 6.6 7.5  8.6  Hemoglobin 13.0 - 17.0 g/dL 12.5(L) 9.8(L) 9.0(L)  Hematocrit 39.0 - 52.0 % 37.4(L) 29.3(L) 27.4(L)  Platelets 150 - 400 K/uL 232 203 181   CMP Latest Ref Rng & Units 10/15/2018 08/05/2018 06/19/2018  Glucose 70 - 99 mg/dL 276(H) - 324(H)  BUN 6 - 20 mg/dL 26(H) - 41(H)  Creatinine 0.61 - 1.24 mg/dL 7.22(H) - 8.14(H)  Sodium 135 - 145 mmol/L 136 - 135  Potassium 3.5 - 5.1 mmol/L 4.2 - 3.8  Chloride 98 - 111 mmol/L 95(L) - 96(L)  CO2 22 - 32 mmol/L 25 - 28  Calcium 8.9 - 10.3 mg/dL 9.1 - 9.1  Total Protein 6.0 - 8.5 g/dL - 7.2 -  Total Bilirubin 0.0 - 1.2 mg/dL - 0.5 -  Alkaline Phos 39 - 117 IU/L - 113 -  AST 0 - 40 IU/L - 15 -  ALT 0 - 44 IU/L - 21 -    Dg Chest 2 View  Result Date: 10/15/2018 CLINICAL DATA:  Chest pain and shortness of breath beginning today. Recent dialysis. EXAM: CHEST - 2 VIEW COMPARISON:  06/15/2018 FINDINGS: The heart size and mediastinal contours are within normal limits. Both lungs are clear. The visualized skeletal structures are unremarkable. IMPRESSION: No active cardiopulmonary disease. Electronically Signed   By: Thomas [name] M.D.   On: 10/15/2018 17:44     Discharge Instructions: Discharge Instructions    Diet - low sodium heart healthy   Complete by:  As directed    Diet - low sodium heart healthy   Complete by:  As directed    Increase activity slowly   Complete by:  As directed    Increase activity slowly   Complete by:  As directed      [name],  It was a pleasure taking care of you during your hospital stay.   Please continue your medications as prescribed and make sure to follow up with Cardiology on 11/4.  Please see your PCP as soon as possible to help manage your uncontrolled diabetes. We will call you to let you know when your appointment has been scheduled.   Thank you for allowing [place] to be a part of your care! And an early 53 Anniversary!    SignedMike Craze, DO 10/17/2018, 12:24 PM   Pager:  [phone]

## 2018-10-16 NOTE — Progress Notes (Signed)
ANTICOAGULATION CONSULT NOTE - Follow Up Consult  Pharmacy Consult for Coumadin Indication: atrial fibrillation  Allergies  Allergen Reactions  . Oxycodone-Acetaminophen Other (See Comments)    Hallucinations/delirum    Patient Measurements: Height: 6\' 2"  (188 cm) Weight: 290 lb (131.5 kg) IBW/kg (Calculated) : 82.2  Vital Signs: Temp: 98.4 F (36.9 C) (10/18 1013) Temp Source: Oral (10/18 1013) BP: 151/95 (10/18 1013) Pulse Rate: 88 (10/18 1013)  Labs: Recent Labs    10/15/18 1623 10/15/18 2150 10/16/18 0205 10/16/18 0756  HGB 12.5*  --   --   --   HCT 37.4*  --   --   --   PLT 232  --   --   --   LABPROT 22.7*  --   --  25.2*  INR 2.03  --   --  2.33  CREATININE 7.22*  --   --   --   TROPONINI  --  0.08* 0.08*  --     Estimated Creatinine Clearance: 17.2 mL/min (A) (by C-G formula based on SCr of 7.22 mg/dL (H)).  Assessment:  52 yr old male continues on Coumadin as prior to admission for atrial fibrillation.  Admitted 10/17 with CP and SOB.  Cardiology has evaluated.   INR remains therapeutic (2.33).   Home regimen: 7.5 mg MWSat, 3.75 mg TuThFriSun  Goal of Therapy:  INR 2-3 Monitor platelets by anticoagulation protocol: Yes   Plan:   Continue Coumadin 3.75 mg TuThFriSun and 7.5 mg MWSun.  Daily PT/INR for now.  Arty Baumgartner, [place] Pager: [phone] or phone: [phone] 10/16/2018,1:03 PM

## 2018-10-16 NOTE — Progress Notes (Signed)
   Subjective: Mr. Heyde reported feeling well this morning. He denied any more chest pain or SOB since his HD yesterday. He said this pain was similar in nature to the pain that brought him to the hospital in June. He wanted to make sure he could go home before Sunday for his wedding anniversary.   Objective:  Vital signs in last 24 hours: Vitals:   10/15/18 2334 10/16/18 0548 10/16/18 1013 10/16/18 1346  BP: 124/88 (!) 154/88 (!) 151/95 (!) 155/91  Pulse: 93 88 88 79  Resp: 18 16 16 17   Temp: 98.6 F (37 C) 98.4 F (36.9 C) 98.4 F (36.9 C) 97.6 F (36.4 C)  TempSrc: Oral Oral Oral Oral  SpO2: 95% 94% 95% 97%  Weight:      Height:       Physical Exam  Constitutional: He is oriented to person, place, and time and well-developed, well-nourished, and in no distress.  Cardiovascular: Normal rate, regular rhythm and normal heart sounds.  No murmur heard. Pulmonary/Chest: Effort normal and breath sounds normal. No respiratory distress. He has no wheezes.  Abdominal: Soft. Bowel sounds are normal. He exhibits no distension.  Musculoskeletal: He exhibits no edema.  Neurological: He is alert and oriented to person, place, and time.  Skin: Skin is warm and dry.    Assessment/Plan:  Active Problems:   Chest pain  Mr.Loder is a 52 yo M w/ PMh of ESRD on dialysis TuThSa, A.fib on Coumadin, systolic/diastolic CHF (last echo 27-51%, grade 2 diastolic dysfx on 06/16), CAD (2 vessel severe obstruction on Left heart cath 05/2018), HIV on Biktarvy, GERD, T2DM, and PAD s/p bilateral BKA presenting with chest pain. He has known coronary artery disease and multiple risk factors concerning for ischemic event as he had experienced 2/3 diamond classification atypical chest pain at rest. However his troponins are at baseline on presentation and his EKG does not show significant ST changes. He appears to have experienced unstable angina but we will admit and trend troponins to rule out NSTEMI. He may  benefit from a repeat cath as he is known to have severe stenosis of multiple vessels and he is at risk for embolic event due to his A.fib.   Atypical Chest pain 2/2 NSTEMI vs unstable angina vs GERD - tropinons 0.08 x2; baseline troponins elevated 2/2 ESRD - denies anymore chest pain since HD yesterday - severe 2 vessel stenosis coronary artery disease on 05/2018 cath. Last echo in 2018 show EF of 35-40% w/ grade 2 diastolic dysfunction. A.fib on amiodarone and metoprolol at home - repeat EKG no ST segment changes, sinus rhythm with 1st degree AV block - cardiology consulted appreciate recommendations - C/w home meds: warfarin per pharm, metoprolol succinate 100mg  daily, amiodarone 200mg  daily, pantoprazole 80mg  daily  End stage renal disease on dialysis TuThSa - last HD cut short 2/2 chest pain - Admit creatinine 7.22 post dialysis. K 4.2; unclear baseline Cr, ranges from ~5-12 - Will need nephro consult for inpatient dialysis if patient is not discharged before his next scheduled dialysis date - C/w home med: cinacalcet 90mg  daily  T2DM - repeat Hgb A1c 11.4 - CBG 237 - glucose checks - Lantus 10 units qhs and SSI  - will need to f/u with PCP    Dispo: Anticipated discharge is approximately today pending cardiology recommendations  Mike Craze, DO 10/16/2018, 1:57 PM Pager: [phone]

## 2018-10-16 NOTE — Consult Note (Addendum)
Cardiology Consultation:   Patient ID: Thomas [name]; [id number]; 1966/07/25   Admit date: 10/15/2018 Date of Consult: 10/16/2018  Primary Care Provider: [name], MD Primary Cardiologist: Fransico Him, MD Primary Electrophysiologist:  n/a   Patient Profile:   Thomas [name] is a 52 y.o. male with a hx of S-D-CHF, Aflutter on amio and coumadin, CAD w/ med mgt, ESRD on HD, DM, HTN, HLD w/ elev trig, bilat BKA, HIV, PAD who is being seen today for the evaluation of chest pain at the request of Dr Lysbeth Galas.  History of Present Illness:   Thomas [name] had a cath 05/2018, results below. Dr [name] recommended med rx, but mid-LAD possibly stented for refractory sx.   Office visit 08/07, no angina, generally stable. No ACE/ARB 2nd renal dz, on BB.   Yesterday a.m., Thomas [name] was awakened by chest pain.  His usual angina, 6/10.  He may have been a little short of breath, took some deep breaths and that seemed to help the pain a little bit.  It resolved without intervention in approximately 20 minutes.  He went on to dialysis.  After 3 hours of dialysis, his blood pressure dropped, he had some diaphoresis and then developed chest pain.  It was again a 6 or 7/10.  He did not get nitro because his blood pressure was low.  He came to the emergency room and was admitted.  His chest pain resolved, again without intervention.  It has not returned.  Today, he feels in his usual state of health.  He denies chest pain or shortness of breath.  He has been working in rehab to learn how to walk again and get stronger.  This is significant exertion for him.  He has not had exertional chest pain or shortness of breath.  He reports compliance with his medications and with fluid restrictions for dialysis.  He has not missed any dialysis appointments.  He feels strongly that he does not want another heart catheterization.  He wants to be discharged and go home.   Past Medical History:  Diagnosis Date  .  Anemia   . Atrial fibrillation ([place])   . Chest pain with moderate risk for cardiac etiology 06/15/2018  . Chronic combined systolic and diastolic CHF (congestive heart failure) ([place])   . Diabetic foot ulcer ([place]) 09/03/2017  . Diabetic wet gangrene of the foot ([place]) 09/04/2017  . ESRD (end stage renal disease) on dialysis [facility])    [place] [address] [place], [place] (10/15/2018)  . Headache    "monthly" (06/15/2018)  . HIV disease ([place])   . Hyperparathyroidism, secondary ([place])   . Hypertension   . Hypertensive heart disease with end stage renal disease on dialysis ([place]) 03/13/2017  . Pneumonia X 1  . Subacute osteomyelitis, right ankle and foot ([place])   . SVT (supraventricular tachycardia) ([place])    ? afib or atrial flutter s/p TEE/DCCV with subsequent ablation due to reoccurrence in [place]  . Type 2 diabetes mellitus ([place])     Past Surgical History:  Procedure Laterality Date  . AMPUTATION Left    foot  . AMPUTATION Right 12/06/2017   Procedure: AMPUTATION BELOW KNEE;  Surgeon: [name], MD;  Location: [place];  Service: Orthopedics;  Laterality: Right;  . APPLICATION OF WOUND VAC Right 09/04/2017   Procedure: APPLICATION OF WOUND VAC;  Surgeon: [name], DPM;  Location: [place];  Service: Podiatry;  Laterality: Right;  . APPLICATION OF WOUND VAC  12/06/2017  Procedure: APPLICATION OF WOUND VAC;  Surgeon: [name], MD;  Location: [place];  Service: Orthopedics;;  . AV FISTULA PLACEMENT Right 10/19/2012  . AV FISTULA REPAIR Right ~ 02/2018   "had it cleaned out"  . BELOW KNEE LEG AMPUTATION Left ~ 2016  . COLONOSCOPY WITH PROPOFOL N/A 07/30/2017   Procedure: COLONOSCOPY WITH PROPOFOL;  Surgeon: [name], MD;  Location: [place];  Service: Endoscopy;  Laterality: N/A;  . ESOPHAGOGASTRODUODENOSCOPY (EGD) WITH PROPOFOL N/A 07/28/2017   Procedure: ESOPHAGOGASTRODUODENOSCOPY (EGD) WITH PROPOFOL;  Surgeon: [name], MD;  Location: [place];  Service: Endoscopy;   Laterality: N/A;  . ESOPHAGOGASTRODUODENOSCOPY (EGD) WITH PROPOFOL N/A 01/15/2018   Procedure: ESOPHAGOGASTRODUODENOSCOPY (EGD) WITH PROPOFOL;  Surgeon: [name], MD;  Location: [facility] ENDOSCOPY;  Service: Endoscopy;  Laterality: N/A;  . FLEXIBLE SIGMOIDOSCOPY N/A 07/27/2017   Procedure: FLEXIBLE SIGMOIDOSCOPY;  Surgeon: [name], MD;  Location: [facility] ENDOSCOPY;  Service: Endoscopy;  Laterality: N/A;  . GRAFT APPLICATION Right 03/47/4259   Procedure: SKIN GRAFT APPLICATION RIGHT FOOT;  Surgeon: [name], DPM;  Location: [place];  Service: Podiatry;  Laterality: Right;  . I&D EXTREMITY Right 09/04/2017   Procedure: IRRIGATION AND DEBRIDEMENT EXTREMITY;  Surgeon: [name], DPM;  Location: [place];  Service: Podiatry;  Laterality: Right;  . I&D EXTREMITY Right 11/12/2017   Procedure: IRRIGATION AND DEBRIDEMENT ULCER RIGHT FOOT;  Surgeon: [name], DPM;  Location: [place];  Service: Podiatry;  Laterality: Right;  . LEFT HEART CATH AND CORONARY ANGIOGRAPHY N/A 06/18/2018   Procedure: LEFT HEART CATH AND CORONARY ANGIOGRAPHY;  Surgeon: [name], Peter M, MD;  Location: [place] [facility];  Service: Cardiovascular;  Laterality: N/A;  . WOUND DEBRIDEMENT N/A 09/24/2017   Procedure: DEBRIDEMENT WOUND;  Surgeon: [name], DPM;  Location: [place];  Service: Podiatry;  Laterality: N/A;     Prior to Admission medications   Medication Sig Start Date End Date Taking? Authorizing Provider  amiodarone (PACERONE) 200 MG tablet Take 1 tablet (200 mg total) by mouth daily. 08/05/18  Yes Simmons, Brittainy M, PA-C  bictegravir-emtricitabine-tenofovir AF (BIKTARVY) 50-200-25 MG TABS tablet Take 1 tablet by mouth daily. 03/02/18  Yes Carlyle Basques, MD  camphor-menthol [facility]) lotion Apply topically as needed for itching. 12/11/17  Yes Colbert Ewing, MD  cinacalcet (SENSIPAR) 90 MG tablet Take 90 mg by mouth daily. 04/27/18  Yes [provider]  ferric gluconate 125 mg in sodium chloride  0.9 % 100 mL Inject 125 mg into the vein Every Tuesday,Thursday,and Saturday with dialysis. 05/10/17  Yes Hosie Poisson, MD  gabapentin (NEURONTIN) 100 MG capsule Take 1 capsule (100 mg total) by mouth every dialysis. 01/09/18  Yes Jamse Arn, MD  insulin glargine (LANTUS) 100 unit/mL SOPN Inject 0.3 mLs (30 Units total) into the skin at bedtime. Patient taking differently: Inject 20 Units into the skin at bedtime.  12/19/17  Yes Angiulli, Lavon Paganini, PA-C  metoprolol succinate (TOPROL-XL) 100 MG 24 hr tablet TAKE 1 TABLET BY MOUTH ONCE DAILY TAKE WITH OR IMMEDIATELY FOLLOWING A MEAL 08/05/18  Yes Rosita Fire, Brittainy M, PA-C  pantoprazole (PROTONIX) 40 MG tablet Take 2 tablets (80 mg total) by mouth daily. 02/16/18  Yes Alphonzo Grieve, MD  polycarbophil (FIBERCON) 625 MG tablet Take 1 tablet (625 mg total) by mouth daily. 12/19/17  Yes Angiulli, Lavon Paganini, PA-C  warfarin (COUMADIN) 7.5 MG tablet TAKE 1/2 TABLET BY MOUTH DAILY, EXCEPT 1 TABLET ON Monday, Wednesday, AND Saturday OR AS DIRECTED BY  ANTICOAGULATION CLINIC. 10/07/18  Yes Turner, Eber Hong, MD  rosuvastatin (CRESTOR) 20 MG tablet TAKE 1 TABLET BY MOUTH ONCE DAILY AT 6PM Patient not taking: Reported on 10/15/2018 08/19/18   [name], MD    Inpatient Medications: Scheduled Meds: . amiodarone  200 mg Oral Daily  . bictegravir-emtricitabine-tenofovir AF  1 tablet Oral Daily  . cinacalcet  90 mg Oral Q breakfast  . insulin aspart  0-15 Units Subcutaneous TID WC  . insulin aspart  0-5 Units Subcutaneous QHS  . insulin glargine  10 Units Subcutaneous QHS  . metoprolol succinate  100 mg Oral Daily  . pantoprazole  80 mg Oral QAC breakfast  . polycarbophil  625 mg Oral Daily  . warfarin  3.25 mg Oral Once per day on Sun Tue Thu Fri  . [START ON 10/17/2018] warfarin  7.5 mg Oral Once per day on Mon Wed Sat  . Warfarin - Pharmacist Dosing Inpatient   Does not apply q1800   Continuous Infusions:  PRN Meds: acetaminophen **OR**  acetaminophen, camphor-menthol, gabapentin  Allergies:    Allergies  Allergen Reactions  . Oxycodone-Acetaminophen Other (See Comments)    Hallucinations/delirum    Social History:   Social History   Socioeconomic History  . Marital status: Married    Spouse name: kim  . Number of children: 3  . Years of education: college  . Highest education level: Not on file  Occupational History  . Occupation: disabled  Social Needs  . Financial resource strain: Not on file  . Food insecurity:    Worry: Not on file    Inability: Not on file  . Transportation needs:    Medical: Not on file    Non-medical: Not on file  Tobacco Use  . Smoking status: Never Smoker  . Smokeless tobacco: Never Used  Substance and Sexual Activity  . Alcohol use: Never    Frequency: Never  . Drug use: Never  . Sexual activity: Not Currently  Lifestyle  . Physical activity:    Days per week: Not on file    Minutes per session: Not on file  . Stress: Not on file  Relationships  . Social connections:    Talks on phone: Not on file    Gets together: Not on file    Attends religious service: Not on file    Active member of club or organization: Not on file    Attends meetings of clubs or organizations: Not on file    Relationship status: Not on file  . Intimate partner violence:    Fear of current or ex partner: Not on file    Emotionally abused: Not on file    Physically abused: Not on file    Forced sexual activity: Not on file  Other Topics Concern  . Not on file  Social History Narrative   Lives in [place] with wife.    Family History:   Family History  Problem Relation Age of Onset  . Heart disease Mother   . Diabetes Mother   . Heart disease Father   . Cancer Neg Hx    Family Status:  Family Status  Relation Name Status  . Mother  Deceased  . Father  Deceased  . Sister  Alive  . Brother  Alive  . MGM  Deceased  . MGF  Deceased  . PGM  Deceased  . PGF  Deceased  . Sister   Alive  . Sister  Alive  . Neg Hx  (Not  Specified)    ROS:  Please see the history of present illness.  All other ROS reviewed and negative.     Physical Exam/Data:   Vitals:   10/15/18 2004 10/15/18 2334 10/16/18 0548 10/16/18 1013  BP: 125/83 124/88 (!) 154/88 (!) 151/95  Pulse: 88 93 88 88  Resp: 18 18 16 16   Temp: 98.2 F (36.8 C) 98.6 F (37 C) 98.4 F (36.9 C) 98.4 F (36.9 C)  TempSrc: Oral Oral Oral Oral  SpO2: 96% 95% 94% 95%  Weight:      Height:        Intake/Output Summary (Last 24 hours) at 10/16/2018 1041 Last data filed at 10/15/2018 2000 Gross per 24 hour  Intake 222 ml  Output -  Net 222 ml   Filed Weights   10/15/18 1602  Weight: 131.5 kg   Body mass index is 37.23 kg/m.  General:  Well nourished, well developed, male in no acute distress HEENT: normal Lymph: no adenopathy Neck: no JVD seen, difficult to assess secondary to body habitus Endocrine:  No thryomegaly Vascular: No carotid bruits; left upper extremity pulse 2+, right upper extremity with dialysis access and palpable thrill, s/p bilat BKAs  Cardiac:  normal S1, S2; RRR; no murmur  Lungs:  clear to auscultation bilaterally, no wheezing, rhonchi or rales  Abd: soft, nontender, no hepatomegaly  Ext: no edema Musculoskeletal:  No deformities, BUE and BLE strength normal and equal Skin: warm and dry  Neuro:  CNs 2-12 intact, no focal abnormalities noted Psych:  Normal affect   EKG:  The EKG was personally reviewed and demonstrates: Sinus rhythm, heart rate 90, prolonged QTC at 426/521 Telemetry:  Telemetry was personally reviewed and demonstrates:  SR  Relevant CV Studies:  ECHO: 10/16/2018 - Procedure narrative: Transthoracic echocardiography. The study   was technically difficult, as a result of body habitus. - Left ventricle: The cavity size was normal. Wall thickness was   increased in a pattern of severe LVH. Systolic function was   moderately reduced. The estimated ejection  fraction was in the   range of 35% to 40%. More prounounced inferolateral wall   hypokinesis. The study is not technically sufficient to allow   evaluation of LV diastolic function. - Aortic valve: Sclerosis without stenosis. There was mild   regurgitation. - Aorta: Ascending aorta: 43 mm (ED) Aortic root dimension: 43 mm   (ED). - Aortic root: The aortic root is dilated. - Left atrium: Moderately dilated. - Inferior vena cava: The vessel was dilated. The respirophasic   diameter changes were blunted (< 50%), consistent with elevated   central venous pressure.  Impressions:  - Very technically difficult study. 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  CATH: 06/18/2018  Prox LAD lesion is 30% stenosed.  Mid LAD lesion is 80% stenosed.  Ost 3rd Diag lesion is 95% stenosed.  Dist LAD lesion is 90% stenosed.  Ost 1st Diag lesion is 100% stenosed.  Acute Mrg lesion is 95% stenosed.  1st RPLB lesion is 100% stenosed.  Post Atrio lesion is 95% stenosed.  There is mild to moderate left ventricular systolic dysfunction.  LV end diastolic pressure is normal.  1. Severe 2 vessel obstructive CAD. The entire coronary tree is severely calcified.    - 100% large first diagonal vessel. This fills late by left to left collaterals.    - 80% eccentric  mid LAD    - 90% distal LAD    - 95% small third diagonal    - 95% RV marginal branch.    - 100% first PLOM    - 95% ongoing PL branch of the RCA 2. Mild to moderate LV dysfunction with global hypokinesis. EF estimated at 40-45% 3. Normal LVEDP Plan: I would recommend medical management. Other than the mid LAD and first diagonal the other disease is in smaller distal branches. The diagonal is occluded. The mid LAD could potentially be treated with atherectomy and stenting but I would not advise this unless he has refractory  angina on optimal medical therapy and treatment of his arrhythmia. I also don't think this would add much to his overall prognosis. If no bleeding from cath site can resume coumadin tomorrow.    Laboratory Data:  Chemistry Recent Labs  Lab 10/15/18 1623  NA 136  K 4.2  CL 95*  CO2 25  GLUCOSE 276*  BUN 26*  CREATININE 7.22*  CALCIUM 9.1  GFRNONAA 8*  GFRAA 9*  ANIONGAP 16*    Lab Results  Component Value Date   ALT 21 08/05/2018   AST 15 08/05/2018   ALKPHOS 113 08/05/2018   BILITOT 0.5 08/05/2018   Hematology Recent Labs  Lab 10/15/18 1623  WBC 6.6  RBC 3.88*  HGB 12.5*  HCT 37.4*  MCV 96.4  MCH 32.2  MCHC 33.4  RDW 14.6  PLT 232   Cardiac Enzymes Recent Labs  Lab 10/15/18 2150 10/16/18 0205  TROPONINI 0.08* 0.08*    Recent Labs  Lab 10/15/18 1631 10/15/18 1853  TROPIPOC 0.11* 0.10*    TSH:  Lab Results  Component Value Date   TSH 1.600 08/05/2018   Lipids: Lab Results  Component Value Date   CHOL 202 (H) 06/18/2018   HDL 20 (L) 06/18/2018   LDLCALC UNABLE TO CALCULATE IF TRIGLYCERIDE OVER 400 mg/dL 06/18/2018   TRIG 596 (H) 06/18/2018   CHOLHDL 10.1 06/18/2018   HgbA1c: Lab Results  Component Value Date   HGBA1C 7.7 (H) 09/24/2017   Magnesium:  Magnesium  Date Value Ref Range Status  06/17/2018 2.2 1.7 - 2.4 mg/dL Final    Comment:    Performed at [facility], [place] [address] [place], [place] [address]     Radiology/Studies:  Dg Chest 2 View  Result Date: 10/15/2018 CLINICAL DATA:  Chest pain and shortness of breath beginning today. Recent dialysis. EXAM: CHEST - 2 VIEW COMPARISON:  06/15/2018 FINDINGS: The heart size and mediastinal contours are within normal limits. Both lungs are clear. The visualized skeletal structures are unremarkable. IMPRESSION: No active cardiopulmonary disease. Electronically Signed   By: Earle Gell M.D.   On: 10/15/2018 17:44    Assessment and Plan:   1. Anginal chest pain - he has been  exerting himself in physical therapy without chest pain -Had 2 episodes of chest pain yesterday, but today feels normal - Minimal elevation of troponin is consistent with end-stage renal disease, but may have some demand ischemia related to stress of dialysis and hypotension -He has refused cath today, but if that is felt to be the best option, can discuss bringing him in as an outpatient for a first case -With recent cath, do not see a role for stress testing.  2.  Hypertension: -SBP 97 on admission but is currently elevated. - He is on home Rx - Consider adding Imdur your, he could possibly take a whole tablet on nondialysis  days and 1/2 tablet after dialysis on dialysis days.  Active Problems:   Chest pain   For questions or updates, please contact [place] Please consult [email or web address] for contact info under Cardiology/STEMI.   Signed, Rosaria Ferries, PA-C  10/16/2018 10:41 AM ---------------------------------------------------------------------------------------------   History and all data above reviewed.  Patient examined.  I agree with the findings as above.  Thomas [name] is chest pain free, and is declining coronary cath today.  Constitutional: No acute distress Eyes: pupils equally round and reactive to light, sclera non-icteric, normal conjunctiva and lids ENMT: normal dentition, moist mucous membranes Cardiovascular: regular rhythm, normal rate, no murmurs. S1 and S2 normal. Radial pulses normal bilaterally. No jugular venous distention.  Respiratory: clear to auscultation bilaterally GI : normal bowel sounds, soft and nontender. No distention.   MSK: extremities warm, well perfused. No edema.  LYMPH: No lymphadenopathy noted of the head and neck NEURO: grossly nonfocal exam, moves all extremities. PSYCH: alert and oriented x 3, normal mood and affect.   All available labs, radiology testing, previous records reviewed. Agree with documented assessment and plan  of my colleague as stated above with the following additions or changes:  Active Problems:   Chest pain   Plan: Thomas [name] is currently chest pain-free and is declining coronary catheterization at this time.  He is very interested in eating.  We discussed that coronary angiography is likely nonurgent and can be discussed in close follow-up with cardiology.  Dr. [name] has performed his previous coronary angiogram and would likely be an excellent person to see the patient in close follow-up so as to provide an interventional consultation for angina management.  I agree with my colleague as noted above that his chest pain is likely demand related due to hypotension during dialysis, and his chest pain prior to dialysis may have been secondary to volume preceding dialysis.  He should have a low threshold for presenting to the hospital again if he has concerning chest pain given his known coronary artery disease.  He is anticipating hospital discharge, and no other cardiovascular testing is required at this time.  Cardiology will sign off at this time, I have communicated with the primary service by phone and they are anticipating hospital discharge.  Elouise Munroe, MD [facility] 4:49 PM  10/16/2018

## 2018-10-16 NOTE — Discharge Instructions (Addendum)
Thomas [name],  It was a pleasure taking care of you during your hospital stay.   Please continue your medications as prescribed and make sure to follow up with Cardiology on 11/4.  Please see your PCP as soon as possible to help manage your uncontrolled diabetes. We will call you to let you know when your appointment has been scheduled.   Thank you for allowing [place] to be a part of your care! And an early 30 Anniversary!          Information on my medicine - Coumadin   (Warfarin)  Why was Coumadin prescribed for you? Coumadin was prescribed for you because you have a blood clot or a medical condition that can cause an increased risk of forming blood clots. Blood clots can cause serious health problems by blocking the flow of blood to the heart, lung, or brain. Coumadin can prevent harmful blood clots from forming. As a reminder your indication for Coumadin is:   Stroke Prevention Because Of Atrial Fibrillation  What test will check on my response to Coumadin? While on Coumadin (warfarin) you will need to have an INR test regularly to ensure that your dose is keeping you in the desired range. The INR (international normalized ratio) number is calculated from the result of the laboratory test called prothrombin time (PT).  If an INR APPOINTMENT HAS NOT ALREADY BEEN MADE FOR YOU please schedule an appointment to have this lab work done by your health care provider within 7 days. Your INR goal is usually a number between:  2 to 3 or your provider may give you a [name] narrow range like 2-2.5.  Ask your health care provider during an office visit what your goal INR is.  What  do you need to  know  About  COUMADIN? Take Coumadin (warfarin) exactly as prescribed by your healthcare provider about the same time each day.  DO NOT stop taking without talking to the doctor who prescribed the medication.  Stopping without other blood clot prevention medication to take the place of Coumadin may  increase your risk of developing a new clot or stroke.  Get refills before you run out.  What do you do if you miss a dose? If you miss a dose, take it as soon as you remember on the same day then continue your regularly scheduled regimen the next day.  Do not take two doses of Coumadin at the same time.  Important Safety Information A possible side effect of Coumadin (Warfarin) is an increased risk of bleeding. You should call your healthcare provider right away if you experience any of the following: ? Bleeding from an injury or your nose that does not stop. ? Unusual colored urine (red or dark brown) or unusual colored stools (red or black). ? Unusual bruising for unknown reasons. ? A serious fall or if you hit your head (even if there is no bleeding).  Some foods or medicines interact with Coumadin (warfarin) and might alter your response to warfarin. To help avoid this: ? Eat a balanced diet, maintaining a consistent amount of Vitamin K. ? Notify your provider about major diet changes you plan to make. ? Avoid alcohol or limit your intake to 1 drink for women and 2 drinks for men per day. (1 drink is 5 oz. wine, 12 oz. beer, or 1.5 oz. liquor.)  Make sure that ANY health care provider who prescribes medication for you knows that you are taking Coumadin (warfarin).  Also make  sure the healthcare provider who is monitoring your Coumadin knows when you have started a new medication including herbals and non-prescription products.  Coumadin (Warfarin)  Major Drug Interactions  Increased Warfarin Effect Decreased Warfarin Effect  Alcohol (large quantities) Antibiotics (esp. Septra/Bactrim, Flagyl, Cipro) Amiodarone (Cordarone) Aspirin (ASA) Cimetidine (Tagamet) Megestrol (Megace) NSAIDs (ibuprofen, naproxen, etc.) Piroxicam (Feldene) Propafenone (Rythmol SR) Propranolol (Inderal) Isoniazid (INH) Posaconazole (Noxafil) Barbiturates (Phenobarbital) Carbamazepine  (Tegretol) Chlordiazepoxide (Librium) Cholestyramine (Questran) Griseofulvin Oral Contraceptives Rifampin Sucralfate (Carafate) Vitamin K   Coumadin (Warfarin) Major Herbal Interactions  Increased Warfarin Effect Decreased Warfarin Effect  Garlic Ginseng Ginkgo biloba Coenzyme Q10 Green tea St. Johns wort    Coumadin (Warfarin) FOOD Interactions  Eat a consistent number of servings per week of foods HIGH in Vitamin K (1 serving =  cup)  Collards (cooked, or boiled & drained) Kale (cooked, or boiled & drained) Mustard greens (cooked, or boiled & drained) Parsley *serving size only =  cup Spinach (cooked, or boiled & drained) Swiss chard (cooked, or boiled & drained) Turnip greens (cooked, or boiled & drained)  Eat a consistent number of servings per week of foods MEDIUM-HIGH in Vitamin K (1 serving = 1 cup)  Asparagus (cooked, or boiled & drained) Broccoli (cooked, boiled & drained, or raw & chopped) Brussel sprouts (cooked, or boiled & drained) *serving size only =  cup Lettuce, raw (green leaf, endive, romaine) Spinach, raw Turnip greens, raw & chopped   These websites have [name] information on Coumadin (warfarin):  FailFactory.se; VeganReport.com.au;

## 2018-10-19 ENCOUNTER — Telehealth: Payer: Self-pay

## 2018-10-19 NOTE — Telephone Encounter (Signed)
Hospital TOC per Dr Laural Golden, appt 10/26/2018.

## 2018-10-21 ENCOUNTER — Ambulatory Visit: Payer: Medicare Other | Admitting: Physical Therapy

## 2018-10-21 ENCOUNTER — Encounter: Payer: Self-pay | Admitting: Physical Therapy

## 2018-10-21 DIAGNOSIS — R2689 Other abnormalities of gait and mobility: Secondary | ICD-10-CM

## 2018-10-21 DIAGNOSIS — Z9181 History of falling: Secondary | ICD-10-CM

## 2018-10-21 DIAGNOSIS — R293 Abnormal posture: Secondary | ICD-10-CM

## 2018-10-21 DIAGNOSIS — M6281 Muscle weakness (generalized): Secondary | ICD-10-CM

## 2018-10-21 DIAGNOSIS — R2681 Unsteadiness on feet: Secondary | ICD-10-CM

## 2018-10-22 NOTE — Therapy (Signed)
[facility] [address] [place], [place], [address] Phone: [phone]   Fax:  [phone]  Physical Therapy Treatment  Patient Details  Name: Thomas [name] MRN: [id number] Date of Birth: 07-09-1966 Referring Provider (PT): Meridee Score, MD   Encounter Date: 10/21/2018  PT End of Session - 10/21/18 1801    Visit Number  19    Number of Visits  26    Date for PT Re-Evaluation  08/14/18    Authorization Type  Medicare & Generic commercial    PT Start Time  1100    PT Stop Time  1145    PT Time Calculation (min)  45 min    Equipment Utilized During Treatment  Gait belt    Activity Tolerance  Patient tolerated treatment well;Patient limited by pain;Patient limited by fatigue    Behavior During Therapy  [facility] for tasks assessed/performed       Past Medical History:  Diagnosis Date  . Anemia   . Atrial fibrillation ([place])   . Chest pain with moderate risk for cardiac etiology 06/15/2018  . Chronic combined systolic and diastolic CHF (congestive heart failure) ([place])   . Diabetic foot ulcer ([place]) 09/03/2017  . 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 ESRD (end stage renal disease) on dialysis [facility])    [place] [address] [place], [place], [place] (10/15/2018)  . Headache    "monthly" (06/15/2018)  . HIV disease ([place])   . Hyperparathyroidism, secondary ([place])   . Hypertension   . Hypertensive heart disease with end stage renal disease on dialysis ([place]) 03/13/2017  . Pneumonia X 1  . Subacute osteomyelitis, right ankle and foot ([place])   . SVT (supraventricular tachycardia) ([place])    ? afib or atrial flutter s/p TEE/DCCV with subsequent ablation due to reoccurrence in [place]  . Type 2 diabetes mellitus ([place])     Past Surgical History:  Procedure Laterality Date  . AMPUTATION Left    foot  . AMPUTATION Right 12/06/2017   Procedure: AMPUTATION BELOW KNEE;  Surgeon: [name], MD;  Location: [facility];  Service: Orthopedics;   Laterality: Right;  . APPLICATION OF WOUND VAC Right 09/04/2017   Procedure: APPLICATION OF WOUND VAC;  Surgeon: [name], DPM;  Location: [facility];  Service: Podiatry;  Laterality: Right;  . APPLICATION OF WOUND VAC  12/06/2017   Procedure: APPLICATION OF WOUND VAC;  Surgeon: [name], MD;  Location: [facility];  Service: Orthopedics;;  . AV FISTULA PLACEMENT Right 10/19/2012  . AV FISTULA REPAIR Right ~ 02/2018   "had it cleaned out"  . BELOW KNEE LEG AMPUTATION Left ~ 2016  . COLONOSCOPY WITH PROPOFOL N/A 07/30/2017   Procedure: COLONOSCOPY WITH PROPOFOL;  Surgeon: [name], MD;  Location: [facility];  Service: Endoscopy;  Laterality: N/A;  . ESOPHAGOGASTRODUODENOSCOPY (EGD) WITH PROPOFOL N/A 07/28/2017   Procedure: ESOPHAGOGASTRODUODENOSCOPY (EGD) WITH PROPOFOL;  Surgeon: [name], MD;  Location: [facility];  Service: Endoscopy;  Laterality: N/A;  . ESOPHAGOGASTRODUODENOSCOPY (EGD) WITH PROPOFOL N/A 01/15/2018   Procedure: ESOPHAGOGASTRODUODENOSCOPY (EGD) WITH PROPOFOL;  Surgeon: [name], MD;  Location: [facility] ENDOSCOPY;  Service: Endoscopy;  Laterality: N/A;  . FLEXIBLE SIGMOIDOSCOPY N/A 07/27/2017   Procedure: FLEXIBLE SIGMOIDOSCOPY;  Surgeon: [name], MD;  Location: [facility] ENDOSCOPY;  Service: Endoscopy;  Laterality: N/A;  . GRAFT APPLICATION Right 12/87/8676   Procedure: SKIN GRAFT APPLICATION RIGHT FOOT;  Surgeon: [name], DPM;  Location: [facility];  Service: Podiatry;  Laterality: Right;  . I&D EXTREMITY Right 09/04/2017   Procedure: IRRIGATION AND DEBRIDEMENT EXTREMITY;  Surgeon: [name], DPM;  Location: [facility];  Service: Podiatry;  Laterality: Right;  . I&D EXTREMITY Right 11/12/2017   Procedure: IRRIGATION AND DEBRIDEMENT ULCER RIGHT FOOT;  Surgeon: [name], DPM;  Location: [facility] [place] [facility];  Service: Podiatry;  Laterality: Right;  . LEFT HEART CATH AND CORONARY ANGIOGRAPHY N/A 06/18/2018   Procedure: LEFT HEART CATH AND CORONARY ANGIOGRAPHY;   Surgeon: [name], Peter M, MD;  Location: [place];  Service: Cardiovascular;  Laterality: N/A;  . WOUND DEBRIDEMENT N/A 09/24/2017   Procedure: DEBRIDEMENT WOUND;  Surgeon: [name], DPM;  Location: [facility];  Service: Podiatry;  Laterality: N/A;    There were no vitals filed for this visit.  Subjective Assessment - 10/21/18 1105    Subjective  His limb is better after the fall. He had SOB at dialysis 6 days ago. He went to ED /overnight with chest pain and was only monitored. His A-Fib improved. No more issues.     Pertinent History  R TTA, ESRD, DM2, HIV, cardiomyopathy, CHF, Tachycardia, A-Fib, Mallory-Weiss Tear, obesity    Limitations  Standing;Walking;House hold activities    Patient Stated Goals  To use bilateral prostheses to dance with wife & swimming,     Currently in Pain?  No/denies    Pain Onset  1 to 4 weeks ago      Prosthetic Training: See pt education & PT assessment comments Pt ambulated indoors/outdoors 500' with cane & prostheses with supervision including curb cut level ramp, 25' on grass and curb near car (did not have to touch today) using 45* sideways technique. PT provided verbal cues for technique.  Gait with head turns to scan right /left & up/down and longer steps to pick up pace to work on ability to cross a street.   Bilateral leg press 100# 15 reps 2 sets                         PT Education - 10/21/18 1130    Education provided  Yes    Education Details  traveling with prostheses including use of rolling suitcase with 4 swivel wheels as 2nd assistive device and considering purchasing cane that has seat but need to make sure he is not over weight capacity.   Varying assistive device depending on environment & distance.     Person(s) Educated  Patient    Methods  Explanation;Verbal cues    Comprehension  Verbalized understanding       PT Short Term Goals - 09/23/18 2135      PT SHORT TERM GOAL #1   Title  Patient  verbalizes proper adjustment of ply socks with limb volume changes. (All STGs Target Date: 09/11/2018)    Baseline  MET 09/23/2018    Time  4    Period  Weeks    Status  Achieved      PT SHORT TERM GOAL #2   Title  Patient ambulates 200' negotiating around obstacles carrying plate of objects safely with verbal cues.     Baseline  MET 09/23/2018    Time  4    Period  Weeks    Status  Achieved      PT SHORT TERM GOAL #3   Title  Patient reaches to floor with cane to retrieve 3# object with supervision.    Baseline  MET 09/23/2018  Time  4    Period  Weeks    Status  Achieved      PT SHORT TERM GOAL #4   Title  Patient ambulates 750' with cane outdoors including 100' on grass with supervision.     Baseline  MET 09/23/2018    Time  4    Period  Weeks    Status  Achieved      PT SHORT TERM GOAL #5   Title  Patient negotiates curb with cane & prostheses with supervision.    Baseline  patient improved curb but requires light touch on car hood or PT hand    Time  4    Period  Weeks    Status  Partially Met        PT Long Term Goals - 10/21/18 1800      PT LONG TERM GOAL #1   Title  Patient verbalizes & demonstrates proper prosthetic care including problem solving advanced issues to enable safe use of bilateral Transtibial prostheses. (All LTGs Target Date: 11/06/2018)    Baseline        Time  12    Period  Weeks    Status  On-going    Target Date  11/06/18      PT LONG TERM GOAL #2   Title  Patient tolerates wear of bilateral Transtibial prostheses >90% of awake hours without skin issues or limb pain to enable function during most of his day.     Baseline        Time  12    Period  Weeks    Status  On-going    Target Date  11/06/18      PT LONG TERM GOAL #3   Title  Berg Balance >36/56    Baseline        Time  12    Period  Weeks    Status  On-going    Target Date  11/06/18      PT LONG TERM GOAL #4   Title  Patient ambulates 1000' outdoors including grass with  cane or less & bilateral prostheses modified independent for community mobility.     Baseline        Time  12    Period  Weeks    Status  On-going    Target Date  11/06/18      PT LONG TERM GOAL #5   Title  Patient ambulates 200' around furniture carrying plate or cup with prostheses only modified independent for household mobility.     Baseline       Time  12    Period  Weeks    Status  On-going    Target Date  11/06/18      PT LONG TERM GOAL #6   Title  Patient negotiates ramps, curbs & stairs (1 rail) with cane or less & prostheses modified independent for community access.     Baseline        Time  12    Period  Weeks    Status  On-going    Target Date  11/06/18      PT LONG TERM GOAL #7   Title  6 Minute Walk Test with cane & prostheses >900'    Baseline       Time  12    Period  Weeks    Status  On-going    Target Date  11/06/18  Plan - 10/21/18 1800    Clinical Impression Statement  Today's skilled session focused on how to increase activity level using various assistive devices or shopping cart or rolling suitcase for longer distances. Patient seems to understand that pushing distances beyond his current capabilities with only cane support places him at risk for greater balance issues, falls, musculoskeletal issues or cardiopulmonary issues.     Rehab Potential  Good    PT Frequency  1x / week    PT Duration  12 weeks    PT Treatment/Interventions  ADLs/Self Care Home Management;Canalith Repostioning;DME Instruction;Gait training;Stair training;Functional mobility training;Therapeutic activities;Therapeutic exercise;Balance training;Patient/family education;Prosthetic Training;Vestibular;Manual techniques    PT Next Visit Plan  work towards [place] for 2 more visits       Patient will benefit from skilled therapeutic intervention in order to improve the following deficits and impairments:  Abnormal gait, Cardiopulmonary status limiting activity,  Decreased activity tolerance, Decreased balance, Decreased endurance, Decreased mobility, Decreased knowledge of use of DME, Decreased range of motion, Decreased scar mobility, Decreased strength, Postural dysfunction, Prosthetic Dependency  Visit Diagnosis: Unsteadiness on feet  Other abnormalities of gait and mobility  Muscle weakness (generalized)  Abnormal posture  History of falling     Problem List Patient Active Problem List   Diagnosis Date Noted  . Chest pain 10/15/2018  . Non-ST elevation (NSTEMI) myocardial infarction ([place])   . Persistent atrial fibrillation with rapid ventricular response 06/15/2018  . Chest pain with moderate risk for cardiac etiology 06/15/2018  . Atrial flutter ([place]) 06/15/2018  . Somniloquy 06/12/2018  . Encounter for therapeutic drug monitoring 05/27/2018  . Chronic systolic heart failure ([place])   . Demand ischemia ([place])   . Atrial fibrillation with RVR ([place]) 05/02/2018  . Murmur, cardiac 02/13/2018  . Mallory-Weiss tear 01/16/2018  . Morbid obesity ([place])   . Fall   . Subtherapeutic international normalized ratio (INR)   . History of left below knee amputation ([place]) 12/11/2017  . History of supraventricular tachycardia   . S/P bilateral BKA (below knee amputation) ([place])   . Anemia of chronic disease   . Leukocytosis   . Non-ischemic cardiomyopathy ([place])   . Hypokalemia 09/04/2017  . Essential hypertension 09/03/2017  . GERD (gastroesophageal reflux disease) 09/03/2017  . GIB (gastrointestinal bleeding) 07/26/2017  . Anemia due to end stage renal disease ([place]) 07/26/2017  . PAF (paroxysmal atrial fibrillation) ([place]) 07/26/2017  . Symptomatic anemia 07/26/2017  . End stage renal disease ([place]) 05/05/2017  . HIV disease ([place]) 03/25/2017  . Hypertensive heart disease with end stage renal disease on dialysis ([place]) 03/13/2017  . ESRD on dialysis ([place]) 03/13/2017  . Type 2 diabetes mellitus with complication ([place]) 29/79/8921  . SVT  (supraventricular tachycardia) ([place])     Shamell Suarez PT, DPT 10/22/2018, 8:59 AM  [facility] [address] [place], [place], [address] Phone: [phone]   Fax:  [phone]  Name: Hilding Quintanar MRN: [id number] Date of Birth: 10/10/1966

## 2018-10-23 NOTE — Progress Notes (Deleted)
Cardiology Office Note   Date:  10/23/2018   ID:  Thomas [name], DOB 06-Nov-1966, MRN [id number]  PCP:  [name], MD  Cardiologist:  Fransico Him MD  No chief complaint on file.     History of Present Illness: Thomas [name] is a 52 y.o. male who presents for Interventional cardiology assessment of CAD. He has a history of atrial flutter, chronic systolic HF and CAD. He has other multiple background illnesses, which include controlled HIV infection, insulin requiring diabetes mellitus, end-stage renal disease on hemodialysis and PAD with bilateral below-the-knee amputations. He was  admitted to the hospital in June 2019 for angina and atypical atrial flutter. 2D echo showed redcued LVEF, down to 35-40%. Cardiac cath showed severe 2 vessel obstructive CAD. The entire coronary tree is severely calcified (angiographic details are outlined below in cath report). Medical management recommended.  Per my cath report, "I would recommend medical management. Other than the mid LAD and first diagonal the other disease is in smaller distal branches. The diagonal is occluded. The mid LAD could potentially be treated with atherectomy and stenting but I would not advise this unless he has refractory angina on optimal medical therapy and treatment of his arrhythmia. I also don't think this would add much to his overall prognosis". He was placed on medical therapy. Nephrology assisted with HD for volume removal to treat acute CHF.  For his atrial flutter, he was started on IV amiodarone and transition to p.o. at a dose of 400 mg daily.  He was discharged on this dose and Coumadin was continued.  It should also be noted that the patient was observed to have a slightly prolonged QT interval which was recommended to be followed closely in the outpatient setting.  He was seen back for post hospital follow-up on July 01, 2018.  EKG showed normal sinus rhythm.  His amiodarone dose was further reduced down to 200 mg  daily.  He was seen by Cardiology service on 10/16/18 for chest pain. He had 20 minutes of chest pain and SOB one morning. Pain resolved. He went to HD. Became hypotensive and pain returned. Ecg without acute change. Troponin slightly elevated 0.11 with flat trend. Angina felt to be demand related secondary to hypotension. Patient refused repeat cath and medical therapy again recommended.     Past Medical History:  Diagnosis Date  . Anemia   . Atrial fibrillation ([place])   . Chest pain with moderate risk for cardiac etiology 06/15/2018  . Chronic combined systolic and diastolic CHF (congestive heart failure) ([place])   . Diabetic foot ulcer ([place]) 09/03/2017  . Diabetic wet gangrene of the foot ([place]) 09/04/2017  . ESRD (end stage renal disease) on dialysis [facility])    [facility] [address], [place] (10/15/2018)  . Headache    "monthly" (06/15/2018)  . HIV disease ([place])   . Hyperparathyroidism, secondary ([place])   . Hypertension   . Hypertensive heart disease with end stage renal disease on dialysis ([place]) 03/13/2017  . Pneumonia X 1  . Subacute osteomyelitis, right ankle and foot ([place])   . SVT (supraventricular tachycardia) ([place])    ? afib or atrial flutter s/p TEE/DCCV with subsequent ablation due to reoccurrence in [place]  . Type 2 diabetes mellitus ([place])     Past Surgical History:  Procedure Laterality Date  . AMPUTATION Left    foot  . AMPUTATION Right 12/06/2017   Procedure: AMPUTATION BELOW KNEE;  Surgeon: [name], MD;  Location:  [facility];  Service: Orthopedics;  Laterality: Right;  . APPLICATION OF WOUND VAC Right 09/04/2017   Procedure: APPLICATION OF WOUND VAC;  Surgeon: [name], DPM;  Location: [place];  Service: Podiatry;  Laterality: Right;  . APPLICATION OF WOUND VAC  12/06/2017   Procedure: APPLICATION OF WOUND VAC;  Surgeon: [name], MD;  Location: [place];  Service: Orthopedics;;  . AV FISTULA PLACEMENT Right 10/19/2012  . AV FISTULA REPAIR Right ~ 02/2018   "had it  cleaned out"  . BELOW KNEE LEG AMPUTATION Left ~ 2016  . COLONOSCOPY WITH PROPOFOL N/A 07/30/2017   Procedure: COLONOSCOPY WITH PROPOFOL;  Surgeon: [name], MD;  Location: [place];  Service: Endoscopy;  Laterality: N/A;  . ESOPHAGOGASTRODUODENOSCOPY (EGD) WITH PROPOFOL N/A 07/28/2017   Procedure: ESOPHAGOGASTRODUODENOSCOPY (EGD) WITH PROPOFOL;  Surgeon: [name], MD;  Location: [place];  Service: Endoscopy;  Laterality: N/A;  . ESOPHAGOGASTRODUODENOSCOPY (EGD) WITH PROPOFOL N/A 01/15/2018   Procedure: ESOPHAGOGASTRODUODENOSCOPY (EGD) WITH PROPOFOL;  Surgeon: [name], MD;  Location: [facility] END[facility];  Service: Endoscopy;  Laterality: N/A;  . FLEXIBLE SIGMOIDOSCOPY N/A 07/27/2017   Procedure: FLEXIBLE SIGMOIDOSCOPY;  Surgeon: [name], MD;  Location[facility] END[facility];  Service: Endoscopy;  Laterality: N/A;  . GRAFT APPLICATION Right 17/00/1749   Procedure: SKIN GRAFT APPLICATION RIGHT FOOT;  Surgeon: [name], DPM;  Location: [place];  Service: Podiatry;  Laterality: Right;  . I&D EXTREMITY Right 09/04/2017   Procedure: IRRIGATION AND DEBRIDEMENT EXTREMITY;  Surgeon: [name], DPM;  Location: [place];  Service: Podiatry;  Laterality: Right;  . I&D EXTREMITY Right 11/12/2017   Procedure: IRRIGATION AND DEBRIDEMENT ULCER RIGHT FOOT;  Surgeon: [name], DPM;  Location: [facility];  Service: Podiatry;  Laterality: Right;  . LEFT HEART CATH AND CORONARY ANGIOGRAPHY N/A 06/18/2018   Procedure: LEFT HEART CATH AND CORONARY ANGIOGRAPHY;  Surgeon: [name], Horace Lukas M, MD;  Location: [place] [facility];  Service: Cardiovascular;  Laterality: N/A;  . WOUND DEBRIDEMENT N/A 09/24/2017   Procedure: DEBRIDEMENT WOUND;  Surgeon: [name], DPM;  Location: [place];  Service: Podiatry;  Laterality: N/A;     Current Outpatient Medications  Medication Sig Dispense Refill  . amiodarone (PACERONE) 200 MG tablet Take 1 tablet (200 mg total) by mouth daily. 30 tablet 11  .  bictegravir-emtricitabine-tenofovir AF (BIKTARVY) 50-200-25 MG TABS tablet Take 1 tablet by mouth daily. 30 tablet 11  . camphor-menthol (SARNA) lotion Apply topically as needed for itching. 222 mL 0  . cinacalcet (SENSIPAR) 90 MG tablet Take 90 mg by mouth daily.  6  . ferric gluconate 125 mg in sodium chloride 0.9 % 100 mL Inject 125 mg into the vein Every Tuesday,Thursday,and Saturday with dialysis.    Marland Kitchen gabapentin (NEURONTIN) 100 MG capsule Take 1 capsule (100 mg total) by mouth every dialysis. 15 capsule 1  . insulin glargine (LANTUS) 100 unit/mL SOPN Inject 0.3 mLs (30 Units total) into the skin at bedtime. (Patient taking differently: Inject 20 Units into the skin at bedtime. ) 15 mL 11  . metoprolol succinate (TOPROL-XL) 100 MG 24 hr tablet TAKE 1 TABLET BY MOUTH ONCE DAILY TAKE WITH OR IMMEDIATELY FOLLOWING A MEAL 90 tablet 1  . pantoprazole (PROTONIX) 40 MG tablet Take 2 tablets (80 mg total) by mouth daily. 60 tablet 1  . polycarbophil (FIBERCON) 625 MG tablet Take 1 tablet (625 mg total) by mouth daily. 30 tablet 0  . rosuvastatin (CRESTOR) 20 MG tablet TAKE 1  TABLET BY MOUTH ONCE DAILY AT 6PM (Patient not taking: Reported on 10/15/2018) 90 tablet 1  . warfarin (COUMADIN) 7.5 MG tablet TAKE 1/2 TABLET BY MOUTH DAILY, EXCEPT 1 TABLET ON Monday, Wednesday, AND Saturday OR AS DIRECTED BY ANTICOAGULATION CLINIC. 90 tablet 0   No current facility-administered medications for this visit.     Allergies:   Oxycodone-acetaminophen    Social History:  The patient  reports that he has never smoked. He has never used smokeless tobacco. He reports that he does not drink alcohol or use drugs.   Family History:  The patient's ***family history includes Diabetes in his mother; Heart disease in his father and mother.    ROS:  Please see the history of present illness.   Otherwise, review of systems are positive for {NONE DEFAULTED:18576::"none"}.   All other systems are reviewed and negative.     PHYSICAL EXAM: VS:  There were no vitals taken for this visit. , BMI There is no height or weight on file to calculate BMI. GEN: Well nourished, well developed, in no acute distress  HEENT: normal  Neck: no JVD, carotid bruits, or masses Cardiac: ***RRR; no murmurs, rubs, or gallops,no edema  Respiratory:  clear to auscultation bilaterally, normal work of breathing GI: soft, nontender, nondistended, + BS MS: no deformity or atrophy  Skin: warm and dry, no rash Neuro:  Strength and sensation are intact Psych: euthymic mood, full affect   EKG:  EKG {ACTION; IS/IS KCL:27517001} ordered today. The ekg ordered today demonstrates ***   Recent Labs: 06/17/2018: Magnesium 2.2 08/05/2018: ALT 21; TSH 1.600 10/15/2018: BUN 26; Creatinine, Ser 7.22; Hemoglobin 12.5; Platelets 232; Potassium 4.2; Sodium 136    Lipid Panel    Component Value Date/Time   CHOL 202 (H) 06/18/2018 0338   TRIG 596 (H) 06/18/2018 0338   HDL 20 (L) 06/18/2018 0338   CHOLHDL 10.1 06/18/2018 0338   VLDL UNABLE TO CALCULATE IF TRIGLYCERIDE OVER 400 mg/dL 06/18/2018 0338   LDLCALC UNABLE TO CALCULATE IF TRIGLYCERIDE OVER 400 mg/dL 06/18/2018 0338      Wt Readings from Last 3 Encounters:  10/15/18 290 lb (131.5 kg)  08/05/18 290 lb 1.6 oz (131.6 kg)  07/01/18 290 lb (131.5 kg)      Other studies Reviewed: Additional studies/ records that were reviewed today include:   ECHO: 10/16/2018 - Procedure narrative: Transthoracic echocardiography. The study was technically difficult, as a result of body habitus. - Left ventricle: The cavity size was normal. Wall thickness was increased in a pattern of severe LVH. Systolic function was moderately reduced. The estimated ejection fraction was in the range of 35% to 40%. More prounounced inferolateral wall hypokinesis. The study is not technically sufficient to allow evaluation of LV diastolic function. - Aortic valve: Sclerosis without stenosis.  There was mild regurgitation. - Aorta: Ascending aorta: 43 mm (ED) Aortic root dimension: 43 mm (ED). - Aortic root: The aortic root is dilated. - Left atrium: Moderately dilated. - Inferior vena cava: The vessel was dilated. The respirophasic diameter changes were blunted (<50%), consistent with elevated central venous pressure.  Impressions:  - Very technically difficult study. 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  CATH: 06/18/2018  Prox LAD lesion is 30% stenosed.  Mid LAD lesion is 80% stenosed.  Ost 3rd Diag lesion is 95% stenosed.  Dist LAD lesion is 90% stenosed.  Ost 1st Diag lesion is 100% stenosed.  Acute Mrg lesion is 95% stenosed.  1st RPLB lesion is 100% stenosed.  Post Atrio lesion is 95% stenosed.  There is mild to moderate left ventricular systolic dysfunction.  LV end diastolic pressure is normal. 1. Severe 2 vessel obstructive CAD. The entire coronary tree is severely calcified. - 100% large first diagonal vessel. This fills late by left to left collaterals. - 80% eccentric mid LAD - 90% distal LAD - 95% small third diagonal - 95% RV marginal branch. - 100% first PLOM - 95% ongoing PL branch of the RCA 2. Mild to moderate LV dysfunction with global hypokinesis. EF estimated at 40-45% 3. Normal LVEDP Plan: I would recommend medical management. Other than the mid LAD and first diagonal the other disease is in smaller distal branches. The diagonal is occluded. The mid LAD could potentially be treated with atherectomy and stenting but I would not advise this unless he has refractory angina on optimal medical therapy and treatment of his arrhythmia. I also don't think this would add much to his overall prognosis. If no bleeding from cath site can resume coumadin tomorrow.   ASSESSMENT AND PLAN:  1.   ***   Current medicines are reviewed at length with the patient today.  The patient {ACTIONS; HAS/DOES NOT HAVE:19233} concerns regarding medicines.  The following changes have been made:  {PLAN; NO CHANGE:13088:s}  Labs/ tests ordered today include: *** No orders of the defined types were placed in this encounter.    Disposition:   FU with *** in {gen number 5-03:888280} {Days to years:10300}  Signed, Nymir Ringler [name], MD  10/23/2018 7:13 AM    [facility] [address], [place], [place], [address] Phone [phone], Fax [phone]

## 2018-10-26 ENCOUNTER — Encounter (INDEPENDENT_AMBULATORY_CARE_PROVIDER_SITE_OTHER): Payer: Self-pay

## 2018-10-26 ENCOUNTER — Other Ambulatory Visit: Payer: Self-pay

## 2018-10-26 ENCOUNTER — Encounter: Payer: Self-pay | Admitting: Internal Medicine

## 2018-10-26 ENCOUNTER — Ambulatory Visit (INDEPENDENT_AMBULATORY_CARE_PROVIDER_SITE_OTHER): Payer: Medicare Other | Admitting: Internal Medicine

## 2018-10-26 VITALS — BP 147/77 | HR 72 | Temp 98.2°F | Ht 77.0 in | Wt 293.3 lb

## 2018-10-26 DIAGNOSIS — N186 End stage renal disease: Secondary | ICD-10-CM

## 2018-10-26 DIAGNOSIS — K529 Noninfective gastroenteritis and colitis, unspecified: Secondary | ICD-10-CM | POA: Diagnosis present

## 2018-10-26 DIAGNOSIS — Z7901 Long term (current) use of anticoagulants: Secondary | ICD-10-CM

## 2018-10-26 DIAGNOSIS — Z992 Dependence on renal dialysis: Secondary | ICD-10-CM

## 2018-10-26 DIAGNOSIS — R011 Cardiac murmur, unspecified: Secondary | ICD-10-CM

## 2018-10-26 DIAGNOSIS — E1122 Type 2 diabetes mellitus with diabetic chronic kidney disease: Secondary | ICD-10-CM | POA: Diagnosis not present

## 2018-10-26 DIAGNOSIS — E118 Type 2 diabetes mellitus with unspecified complications: Secondary | ICD-10-CM

## 2018-10-26 DIAGNOSIS — K909 Intestinal malabsorption, unspecified: Secondary | ICD-10-CM

## 2018-10-26 DIAGNOSIS — Z794 Long term (current) use of insulin: Secondary | ICD-10-CM

## 2018-10-26 DIAGNOSIS — R159 Full incontinence of feces: Secondary | ICD-10-CM | POA: Diagnosis not present

## 2018-10-26 MED ORDER — INSULIN ASPART 100 UNIT/ML FLEXPEN: 10.0000 [IU] | PEN_INJECTOR | Freq: Two times a day (BID) | SUBCUTANEOUS | 11 refills | Status: DC

## 2018-10-26 NOTE — Progress Notes (Addendum)
CC: hospital discharge follow-up  HPI:[name] is a 52 y.o. male who presents for evaluation of atypical chest pain that prompted a hospital admission and diabetes. Please see individual problem based A/P for details.  Atypical Chest Pain: Resolved.  Intractable diarrhea: Patient with >6 months of diarrhea, stated that this began at approximately the same time as hemodialysis. Described as constant loose water stool, associated with increased fecal incontinence (has occurred while driving), improved with imodium, not worse with any particular food or activity. The patient denied blood and mucus in his stool, denied fever, chills, nausea, vomiting, abdominal pain, chest pain, headache, visual changes, joint aches, or myalgias. Occurs prior to dialysis. Patient must take an imodium dose prior to HD to avoid cutting this short. Uncertain etiology, was treated with clindamycin in December of last year. 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 DDx includes C. Diff colitis, microscopic colitis, celiac (not typical or classic), chrons, UC or infectious (absent risk factors for giardia).    Plan: Malabsorption evaluation with ferritin, Vit B12 CRP for inflammatory TSH Referral to GI C. Diff PCR  Patient did not complete labs. Symptoms resolved. Lab orders cancelled.   Diabetes: Patient currently making large dietary changes. He would like to consider meal time insulin toda in addition to his Lantus. He does not appear to be at high risk for hypoglycemia, has denied any prior events of DKA. He routinely obtains his POC CGB values at home. He is not an ideal candidate for oral medical treatment due to ESRD. A1c of 11.4%  Plan:  Will start novolog 10 units BID with his two largest meals. He will likely need increasing doses but does not have with him his glucometer. He is expecting to have access to a CGM device soon. The data from this device  would be helpful in titration of his treatment.   PHQ-9: Based on the patients    Office Visit from 10/26/2018 in [place]  PHQ-9 Total Score  1     score we have decided to monitor.  Past Medical History:  Diagnosis Date  . Anemia   . Atrial fibrillation ([place])   . Chest pain with moderate risk for cardiac etiology 06/15/2018  . Chronic combined systolic and diastolic CHF (congestive heart failure) ([place])   . Diabetic foot ulcer ([place]) 09/03/2017  . Diabetic wet gangrene of the foot ([place]) 09/04/2017  . ESRD (end stage renal disease) on dialysis [facility])    [facility] [address], [place] (10/15/2018)  . Headache    "monthly" (06/15/2018)  . HIV disease ([place])   . Hyperparathyroidism, secondary ([place])   . Hypertension   . Hypertensive heart disease with end stage renal disease on dialysis ([place]) 03/13/2017  . Pneumonia X 1  . Subacute osteomyelitis, right ankle and foot ([place])   . SVT (supraventricular tachycardia) ([place])    ? afib or atrial flutter s/p TEE/DCCV with subsequent ablation due to reoccurrence in [place]  . SVT (supraventricular tachycardia) ([place])    ? afib or atrial flutter s/p TEE/DCCV with subsequent ablation due to reoccurrence in [place]  . Type 2 diabetes mellitus ([place])    Review of Systems:  ROS negative except as per HPI.  Physical Exam: Vitals:   10/26/18 1344  BP: (!) 147/77  Pulse: 72  Temp: 98.2 F (36.8 C)  TempSrc: Oral  SpO2: 96%  Weight: 293 lb 4.8 oz (133 kg)  Height: 6\' 5"  (1.956  m)   General: A/O x4, in no acute distress, afebrile, nondiaphoretic Cardio: RRR, mild murmur (fistula)  Pulmonary: CTA bilaterally MSK: BLE nontedner, nonedematous   Assessment & Plan:   See Encounters Tab for problem based charting.  Patient discussed with Dr. Lynnae January

## 2018-10-26 NOTE — Patient Instructions (Addendum)
FOLLOW-UP INSTRUCTIONS When: 1 month For: Routine visit with Dr. Tarri Abernethy or if he is unavailable please schedule in our acute care clinic to have your glucometer downloaded and information reviewed to better adjust your insulin needs.  What to bring: All of your medications and your glucometer   I have made added Novolog Insulin to your medications today. Please take 10 units of this medication with any two of your largest meals each day. If this medication is not covered by your insurance please let us know and we will find an alternative.  Today we discussed your diarrhea and diabetes.   I will notify you of the results of any labs from today's evaluation when available to me.   Thank you for your visit to the [facility] today. If you have any questions or concerns please call us at [phone].    Insulin Injection Instructions, Using Insulin Pens, Adult A subcutaneous injection is a shot of medicine that is injected into the layer of fat between skin and muscle. People with type 1 diabetes must take insulin because their bodies do not make it. People with type 2 diabetes may need to take insulin. There are many different types of insulin. The type of insulin that you take may determine how many injections you give yourself and when you need to take the injections. Choosing a site for injection Insulin absorption varies from site to site. It is best to inject insulin within the same body area, using a different spot in that area for each injection. Do not inject the insulin in the same spot for each injection. There are five main areas that can be used for injecting. These areas include:  Abdomen. This is the preferred area.  Front of thigh.  Upper, outer side of thigh.  Back of upper arm.  Buttocks.  Using an insulin pen First, follow the steps for Getting Ready, then continue with the steps for Injecting the Insulin. Getting Ready 1. Wash your hands with soap and water. If  soap and water are not available, use hand sanitizer. 2. Check the expiration date and type of insulin in the pen. 3. If you are using CLEAR insulin, check to see that it is clear and free of clumps. 4. If you are using CLOUDY insulin, gently roll the pen between your palms several times, or tip the pen up and down several times to mix up the medicine. Do not shake the pen. 5. Remove the cap from the insulin pen. 6. Use an alcohol wipe to clean the rubber stopper of the pen cartridge. 7. Remove the protective paper tab from the disposable needle. Do not let the needle touch anything. 8. Screw the needle onto the pen. 9. Remove the outer and inner plastic covers from the needle. Do not throw away the outer plastic cover yet. 10. Prime the insulin pen by turning the button (dial) to 2 units. Hold the pen with the needle pointing up, and push the button on the opposite end of the pen until a drop of insulin appears at the needle tip. If no insulin appears, repeat this step. 11. Dial the number of units of insulin that you will be injecting. Injecting the Insulin  1. Use an alcohol wipe to clean the site where you will be injecting the needle. Let the site air-dry. 2. Hold the pen in the palm of your writing hand with your thumb on the top. 3. If directed by your health care provider, use  your other hand to pinch and hold about an inch of skin at the injection site. Do not directly touch the cleaned part of the skin. 4. Gently but quickly, put the needle straight into the skin. The needle should be at a 90-degree angle (perpendicular) to the skin, as if to form the letter "L." ? For example, if you are giving an injection in the abdomen, the abdomen forms one "leg" of the "L" and the needle forms the other "leg" of the "L." 5. For adults who have a small amount of body fat, the needle may need to be injected at a 45-degree angle instead. Your health care provider will tell you if this is  necessary. ? A 45-degree angle looks like the letter "V." 6. When the needle is completely inserted into the skin, use the thumb of your writing hand to push the top button of the pen down all the way to inject the insulin. 7. Let go of the skin that you are pinching. Continue to hold the pen in place with your writing hand. 8. Wait five seconds, then pull the needle straight out of the skin. 9. Carefully put the larger (outer) plastic cover of the needle back over the needle, then unscrew the capped needle and discard it in a sharps container, such as an empty plastic bottle with a cover. 10. Put the plastic cap back on the insulin pen. Throwing away supplies  Discard all used needles in a puncture-proof sharps disposal container. You can ask your local pharmacy about where you can get this kind of disposal container, or you can use an empty liquid laundry detergent bottle that has a cover.  Follow the disposal regulations for the area where you live. Do not use any needle more than one time.  Throw away empty disposable pens in the regular trash. What questions should I ask my health care provider?  How often should I be taking insulin?  How often should I check my blood glucose?  What amount of insulin should I be taking at each time?  What are the side effects?  What should I do if my blood glucose is too high?  What should I do if my blood glucose is too low?  What should I do if I forget to take my insulin?  What number should I call if I have questions? Where can I get more information?  American Diabetes Association (ADA): www.diabetes.org  American Association of Diabetes Educators (AADE) Patient Resources: https://www.diabeteseducator.org/patient-resources This information is not intended to replace advice given to you by your health care provider. Make sure you discuss any questions you have with your health care provider. Document Released: 01/19/2016 Document  Revised: 05/23/2016 Document Reviewed: 01/19/2016 Elsevier Interactive Patient Education  [employer].

## 2018-10-27 ENCOUNTER — Encounter: Payer: Self-pay | Admitting: Gastroenterology

## 2018-10-27 DIAGNOSIS — K529 Noninfective gastroenteritis and colitis, unspecified: Secondary | ICD-10-CM | POA: Insufficient documentation

## 2018-10-27 DIAGNOSIS — K909 Intestinal malabsorption, unspecified: Secondary | ICD-10-CM | POA: Insufficient documentation

## 2018-10-27 NOTE — Progress Notes (Signed)
Internal Medicine Clinic Attending  Case discussed with Dr. Harbrecht at the time of the visit.  We reviewed the resident's history and exam and pertinent patient test results.  I agree with the assessment, diagnosis, and plan of care documented in the resident's note.   

## 2018-10-27 NOTE — Assessment & Plan Note (Signed)
See intractable diarrhea A/P

## 2018-10-27 NOTE — Assessment & Plan Note (Addendum)
Intractable diarrhea: Patient with >6 months of diarrhea, stated that this began at approximately the same time as hemodialysis. Described as constant loose water stool, associated with increased fecal incontinence (has occurred while driving), improved with imodium, not worse with any particular food or activity. The patient denied blood and mucus in his stool, denied fever, chills, nausea, vomiting, abdominal pain, chest pain, headache, visual changes, joint aches, or myalgias. Occurs prior to dialysis. Patient must take an imodium dose prior to HD to avoid cutting this short. Uncertain etiology, was treated with clindamycin in December of last year. 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 DDx includes C. Diff colitis, microscopic colitis, celiac (not typical or classic), chrons, UC or infectious (absent risk factors for giardia).    Plan: Malabsorption evaluation with ferritin, Vit B12 CRP for inflammatory TSH Referral to GI C. Diff PCR  Patient did not complete labs. Symptoms resolved. Lab orders cancelled.

## 2018-10-27 NOTE — Assessment & Plan Note (Signed)
Diabetes: Patient currently making large dietary changes. He would like to consider meal time insulin toda in addition to his Lantus. He does not appear to be at high risk for hypoglycemia, has denied any prior events of DKA. He routinely obtains his POC CGB values at home. He is not an ideal candidate for oral medical treatment due to ESRD. A1c of 11.4%  Plan:  Will start novolog 10 units BID with his two largest meals. He will likely need increasing doses but does not have with him his glucometer. He is expecting to have access to a CGM device soon. The data from this device would be helpful in titration of his treatment.

## 2018-10-27 NOTE — Telephone Encounter (Signed)
HFU kept for 10/26/2018.

## 2018-10-28 ENCOUNTER — Ambulatory Visit: Payer: Medicare Other | Admitting: Physical Therapy

## 2018-10-28 ENCOUNTER — Telehealth: Payer: Self-pay | Admitting: *Deleted

## 2018-10-28 DIAGNOSIS — E118 Type 2 diabetes mellitus with unspecified complications: Secondary | ICD-10-CM

## 2018-10-28 NOTE — Telephone Encounter (Signed)
Call to [employer] for PA for Novolog Flex Pen.  Denied as not on formulary.  Patient will need to try and fail Humalog Quik Pen or Insulin Lispro.  Message to be forwarded to the ordering physician .  Sander Nephew, RN 10/28/2018.

## 2018-10-29 MED ORDER — INSULIN LISPRO 100 UNIT/ML (KWIKPEN): PEN_INJECTOR | SUBCUTANEOUS | 11 refills | Status: DC

## 2018-10-29 NOTE — Telephone Encounter (Signed)
Order changed to reflect insurance issue.

## 2018-11-02 ENCOUNTER — Ambulatory Visit: Payer: Medicare Other | Admitting: Cardiology

## 2018-11-03 ENCOUNTER — Encounter: Payer: Self-pay | Admitting: *Deleted

## 2018-11-04 ENCOUNTER — Ambulatory Visit: Payer: Medicare Other | Attending: Orthopedic Surgery | Admitting: Physical Therapy

## 2018-11-04 ENCOUNTER — Encounter: Payer: Self-pay | Admitting: Physical Therapy

## 2018-11-04 DIAGNOSIS — Z9181 History of falling: Secondary | ICD-10-CM | POA: Insufficient documentation

## 2018-11-04 DIAGNOSIS — R2689 Other abnormalities of gait and mobility: Secondary | ICD-10-CM | POA: Insufficient documentation

## 2018-11-04 DIAGNOSIS — M6281 Muscle weakness (generalized): Secondary | ICD-10-CM | POA: Insufficient documentation

## 2018-11-04 DIAGNOSIS — R293 Abnormal posture: Secondary | ICD-10-CM | POA: Diagnosis present

## 2018-11-04 DIAGNOSIS — R2681 Unsteadiness on feet: Secondary | ICD-10-CM | POA: Insufficient documentation

## 2018-11-04 NOTE — Therapy (Signed)
[facility] [address] [place], [place], [address] Phone: [phone]   Fax:  [phone]   PHYSICAL THERAPY DISCHARGE SUMMARY  Visits from Start of Care: 20  Current functional level related to goals / functional outcomes: See below   Remaining deficits: See below   Education / Equipment: Prosthetic care / use and HEP/ongoing fitness plan.   Plan: Patient agrees to discharge.  Patient goals were partially met. Patient is being discharged due to meeting the stated rehab goals.  ?????         Physical Therapy Treatment  Patient Details  Name: Thomas [name] MRN: [id number] Date of Birth: 1966/01/26 Referring Provider (PT): Meridee Score, MD   Encounter Date: 11/04/2018  PT End of Session - 11/04/18 2258    Visit Number  20    Number of Visits  26    Date for PT Re-Evaluation  08/14/18    Authorization Type  Medicare & Generic commercial    PT Start Time  1100    PT Stop Time  1144    PT Time Calculation (min)  44 min    Equipment Utilized During Treatment  Gait belt    Activity Tolerance  Patient tolerated treatment well    Behavior During Therapy  WFL for tasks assessed/performed       Past Medical History:  Diagnosis Date  . Anemia   . Atrial fibrillation ([place])   . Chest pain with moderate risk for cardiac etiology 06/15/2018  . Chronic combined systolic and diastolic CHF (congestive heart failure) ([place])   . Diabetic foot ulcer ([place]) 09/03/2017  . Diabetic wet gangrene of the foot ([place]) 09/04/2017  . ESRD (end stage renal disease) on dialysis [facility])    [facility] [address] T, [place], [place] (10/15/2018)  . Headache    "monthly" (06/15/2018)  . HIV disease ([place])   . Hyperparathyroidism, secondary ([place])   . Hypertension   . Hypertensive heart disease with end stage renal disease on dialysis ([place]) 03/13/2017  . Pneumonia X 1  . Subacute osteomyelitis, right ankle and foot ([place])   . SVT (supraventricular  tachycardia) ([place])    ? afib or atrial flutter s/p TEE/DCCV with subsequent ablation due to reoccurrence in [place]  . SVT (supraventricular tachycardia) ([place])    ? afib or atrial flutter s/p TEE/DCCV with subsequent ablation due to reoccurrence in [place]  . Type 2 diabetes mellitus ([place])     Past Surgical History:  Procedure Laterality Date  . AMPUTATION Left    foot  . AMPUTATION Right 12/06/2017   Procedure: AMPUTATION BELOW KNEE;  Surgeon: [name], MD;  Location: [place];  Service: Orthopedics;  Laterality: Right;  . APPLICATION OF WOUND VAC Right 09/04/2017   Procedure: APPLICATION OF WOUND VAC;  Surgeon: [name], DPM;  Location: [place];  Service: Podiatry;  Laterality: Right;  . APPLICATION OF WOUND VAC  12/06/2017   Procedure: APPLICATION OF WOUND VAC;  Surgeon: [name], MD;  Location: [place];  Service: Orthopedics;;  . AV FISTULA PLACEMENT Right 10/19/2012  . AV FISTULA REPAIR Right ~ 02/2018   "had it cleaned out"  . BELOW KNEE LEG AMPUTATION Left ~ 2016  . COLONOSCOPY WITH PROPOFOL N/A 07/30/2017   Procedure: COLONOSCOPY WITH PROPOFOL;  Surgeon: [name], MD;  Location: [facility];  Service: Endoscopy;  Laterality: N/A;  . ESOPHAGOGASTRODUODENOSCOPY (EGD) WITH PROPOFOL N/A 07/28/2017   Procedure: ESOPHAGOGASTRODUODENOSCOPY (EGD) WITH PROPOFOL;  Surgeon: [name],  MD;  Location: [place] ENDOSCOPY;  Service: Endoscopy;  Laterality: N/A;  . ESOPHAGOGASTRODUODENOSCOPY (EGD) WITH PROPOFOL N/A 01/15/2018   Procedure: ESOPHAGOGASTRODUODENOSCOPY (EGD) WITH PROPOFOL;  Surgeon: [name], MD;  Location: [facility] ENDOSCOPY;  Service: Endoscopy;  Laterality: N/A;  . FLEXIBLE SIGMOIDOSCOPY N/A 07/27/2017   Procedure: FLEXIBLE SIGMOIDOSCOPY;  Surgeon: [name], MD;  Location: [facility] ENDOSCOPY;  Service: Endoscopy;  Laterality: N/A;  . GRAFT APPLICATION Right 87/56/4332   Procedure: SKIN GRAFT APPLICATION RIGHT FOOT;  Surgeon: [name], DPM;  Location: [place];  Service:  Podiatry;  Laterality: Right;  . I&D EXTREMITY Right 09/04/2017   Procedure: IRRIGATION AND DEBRIDEMENT EXTREMITY;  Surgeon: [name], DPM;  Location: [place];  Service: Podiatry;  Laterality: Right;  . I&D EXTREMITY Right 11/12/2017   Procedure: IRRIGATION AND DEBRIDEMENT ULCER RIGHT FOOT;  Surgeon: [name], DPM;  Location: [place];  Service: Podiatry;  Laterality: Right;  . LEFT HEART CATH AND CORONARY ANGIOGRAPHY N/A 06/18/2018   Procedure: LEFT HEART CATH AND CORONARY ANGIOGRAPHY;  Surgeon: [name], Peter M, MD;  Location: [place] CV LAB;  Service: Cardiovascular;  Laterality: N/A;  . WOUND DEBRIDEMENT N/A 09/24/2017   Procedure: DEBRIDEMENT WOUND;  Surgeon: [name], DPM;  Location: [place];  Service: Podiatry;  Laterality: N/A;    There were no vitals filed for this visit.  Subjective Assessment - 11/04/18 1100    Subjective  He is wearing to prostheses all awake hours without issues.     Pertinent History  R TTA, ESRD, DM2, HIV, cardiomyopathy, CHF, Tachycardia, A-Fib, Mallory-Weiss Tear, obesity    Limitations  Standing;Walking;House hold activities    Patient Stated Goals  To use bilateral prostheses to dance with wife & swimming,     Currently in Pain?  No/denies    Pain Onset  1 to 4 weeks ago         [facility] PT Assessment - 11/04/18 1100      Ambulation/Gait   Ambulation/Gait  Yes    Ambulation/Gait Assistance  6: Modified independent (Device/Increase time)      6 Minute Walk- Baseline   6 Minute Walk- Baseline  yes    HR (bpm)  78    02 Sat (%RA)  98 %      6 Minute walk- Post Test   HR (bpm)  92    02 Sat (%RA)  97 %    Modified Borg Scale for Dyspnea  7- Severe shortness of breath or very hard breathing    Perceived Rate of Exertion (Borg)  15- Hard      6 minute walk test results    Aerobic Endurance Distance Walked  680    Endurance additional comments  2 standing rests for 15-30 seconds      Standardized Balance Assessment   Standardized  Balance Assessment  Berg Balance Test      Berg Balance Test   Sit to Stand  Able to stand  independently using hands    Standing Unsupported  Able to stand safely 2 minutes    Sitting with Back Unsupported but Feet Supported on Floor or Stool  Able to sit safely and securely 2 minutes    Stand to Sit  Controls descent by using hands    Transfers  Able to transfer safely, minor use of hands    Standing Unsupported with Eyes Closed  Unable to keep eyes closed 3 seconds but stays steady    Standing Ubsupported with Feet Together  Needs help to attain position and unable to hold for 15 seconds    From Standing, Reach Forward with Outstretched Arm  Reaches forward but needs supervision    From Standing Position, Pick up Object from Floor  Unable to pick up and needs supervision    From Standing Position, Turn to Look Behind Over each Shoulder  Turn sideways only but maintains balance    Turn 360 Degrees  Needs close supervision or verbal cueing    Standing Unsupported, Alternately Place Feet on Step/Stool  Needs assistance to keep from falling or unable to try    Standing Unsupported, One Foot in [place] to take small step independently and hold 30 seconds    Standing on One Leg  Tries to lift leg/unable to hold 3 seconds but remains standing independently    Total Score  27      Prosthetics Assessment - 11/04/18 [place] with  Skin check;Residual limb care;Prosthetic cleaning;Ply sock cleaning;Correct ply sock adjustment;Proper wear schedule/adjustment;Proper weight-bearing schedule/adjustment    Donning prosthesis   Modified independent (Device/Increase time)    Doffing prosthesis   Modified independent (Device/Increase time)    Current prosthetic wear tolerance (days/week)   daily    Current prosthetic wear tolerance (#hours/day)   most of awake hours including dialysis, removes for naps after dialysis    Current prosthetic weight-bearing  tolerance (hours/day)   Patient tolerates 10 minutes of standing acitivities without limb pain.    Edema  non-pitting edema present in RLE    Residual limb condition   no open areas,                   [facility] Adult PT Treatment/Exercise - 11/04/18 1100      Transfers   Transfers  Sit to Stand;Stand to Sit    Sit to Stand  6: Modified independent (Device/Increase time);With upper extremity assist;With armrests;From chair/3-in-1    Stand to Sit  6: Modified independent (Device/Increase time);With upper extremity assist;With armrests;To chair/3-in-1      Ambulation/Gait   Ambulation/Gait Assistance Details  Pt able to carry cup of water around furniture ambulating without device.     Ambulation Distance (Feet)  1000 Feet   2 standing rests required   Assistive device  Prostheses;Straight cane;None    Gait Pattern  Step-through pattern;Lateral hip instability;Wide base of support    Ambulation Surface  Indoor;Level;Outdoor;Paved    Stairs  Yes    Stairs Assistance  6: Modified independent (Device/Increase time)    Stair Management Technique  Two rails;Alternating pattern;Forwards    Number of Stairs  4    Ramp  6: Modified independent (Device)   cane & prostheses   Curb  6: Modified independent (Device/increase time)   cane & prostheses              PT Short Term Goals - 09/23/18 2135      PT SHORT TERM GOAL #1   Title  Patient verbalizes proper adjustment of ply socks with limb volume changes. (All STGs Target Date: 09/11/2018)    Baseline  MET 09/23/2018    Time  4    Period  Weeks    Status  Achieved      PT SHORT TERM GOAL #2   Title  Patient ambulates 200' negotiating around obstacles carrying plate of objects safely with verbal cues.     Baseline  MET 09/23/2018    Time  4    Period  Weeks    Status  Achieved      PT SHORT TERM GOAL #3   Title  Patient reaches to floor with cane to retrieve 3# object with supervision.    Baseline  MET 09/23/2018    Time   4    Period  Weeks    Status  Achieved      PT SHORT TERM GOAL #4   Title  Patient ambulates 750' with cane outdoors including 100' on grass with supervision.     Baseline  MET 09/23/2018    Time  4    Period  Weeks    Status  Achieved      PT SHORT TERM GOAL #5   Title  Patient negotiates curb with cane & prostheses with supervision.    Baseline  patient improved curb but requires light touch on car hood or PT hand    Time  4    Period  Weeks    Status  Partially Met        PT Long Term Goals - 11/04/18 2259      PT LONG TERM GOAL #1   Title  Patient verbalizes & demonstrates proper prosthetic care including problem solving advanced issues to enable safe use of bilateral Transtibial prostheses. (All LTGs Target Date: 11/06/2018)    Baseline  MET 11/04/2018    Time  12    Period  Weeks    Status  Achieved      PT LONG TERM GOAL #2   Title  Patient tolerates wear of bilateral Transtibial prostheses >90% of awake hours without skin issues or limb pain to enable function during most of his day.     Baseline  MET 11/04/2018    Time  12    Period  Weeks    Status  Achieved      PT LONG TERM GOAL #3   Title  Berg Balance >36/56    Baseline  NOT MET 11/04/2018 Merrilee Jansky Balance 27/56    Time  12    Period  Weeks    Status  Not Met      PT LONG TERM GOAL #4   Title  Patient ambulates 1000' outdoors including grass with cane or less & bilateral prostheses modified independent for community mobility.     Baseline  Partially MET 11/04/2018 with standing rests for fatigue    Time  12    Period  Weeks    Status  Partially Met      PT LONG TERM GOAL #5   Title  Patient ambulates 200' around furniture carrying plate or cup with prostheses only modified independent for household mobility.     Baseline  MET 11/04/2018    Time  12    Period  Weeks    Status  Achieved      PT LONG TERM GOAL #6   Title  Patient negotiates ramps, curbs & stairs (1 rail) with cane or less & prostheses  modified independent for community access.     Baseline  MET 11/04/2018    Time  12    Period  Weeks    Status  Achieved      PT LONG TERM GOAL #7   Title  6 Minute Walk Test with cane & prostheses >900'    Baseline  NOT MET 11/04/2018 6 MWT 680'     Time  12    Period  Weeks    Status  Not Met            Plan - 11/04/18 2302    Clinical Impression Statement  Patient met 5 of 7 LTGs. His Berg Balance 27/56 indicates fall risk with stationary standing ADLs. Patient is able to ambulate community distances with cane & prostheses and household with prostheses only. He continues to have fatigue issues with complications of dialysis with only ambulating 680' in 6 Minute Test.     Rehab Potential  Good    PT Frequency  1x / week    PT Duration  12 weeks    PT Treatment/Interventions  ADLs/Self Care Home Management;Canalith Repostioning;DME Instruction;Gait training;Stair training;Functional mobility training;Therapeutic activities;Therapeutic exercise;Balance training;Patient/family education;Prosthetic Training;Vestibular;Manual techniques    PT Next Visit Plan  discharge PT       Patient will benefit from skilled therapeutic intervention in order to improve the following deficits and impairments:  Abnormal gait, Cardiopulmonary status limiting activity, Decreased activity tolerance, Decreased balance, Decreased endurance, Decreased mobility, Decreased knowledge of use of DME, Decreased range of motion, Decreased scar mobility, Decreased strength, Postural dysfunction, Prosthetic Dependency  Visit Diagnosis: Unsteadiness on feet  Other abnormalities of gait and mobility  Muscle weakness (generalized)  Abnormal posture  History of falling     Problem List Patient Active Problem List   Diagnosis Date Noted  . Noninfectious gastroenteritis 10/27/2018  . 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 Chest pain 10/15/2018  . Non-ST elevation (NSTEMI) myocardial infarction ([place])   .  Persistent atrial fibrillation with rapid ventricular response 06/15/2018  . Chest pain with moderate risk for cardiac etiology 06/15/2018  . Atrial flutter ([place]) 06/15/2018  . Somniloquy 06/12/2018  . Encounter for therapeutic drug monitoring 05/27/2018  . Chronic systolic heart failure ([place])   . Demand ischemia ([place])   . Atrial fibrillation with RVR ([place]) 05/02/2018  . Murmur, cardiac 02/13/2018  . Mallory-Weiss tear 01/16/2018  . Morbid obesity ([place])   . Fall   . Subtherapeutic international normalized ratio (INR)   . History of left below knee amputation ([place]) 12/11/2017  . History of supraventricular tachycardia   . S/P bilateral BKA (below knee amputation) ([place])   . Anemia of chronic disease   . Leukocytosis   . Non-ischemic cardiomyopathy ([place])   . Hypokalemia 09/04/2017  . Essential hypertension 09/03/2017  . GERD (gastroesophageal reflux disease) 09/03/2017  . GIB (gastrointestinal bleeding) 07/26/2017  . Anemia due to end stage renal disease ([place]) 07/26/2017  . PAF (paroxysmal atrial fibrillation) ([place]) 07/26/2017  . Symptomatic anemia 07/26/2017  . End stage renal disease ([place]) 05/05/2017  . HIV disease ([place]) 03/25/2017  . Hypertensive heart disease with end stage renal disease on dialysis ([place]) 03/13/2017  . ESRD on dialysis ([place]) 03/13/2017  . Type 2 diabetes mellitus with complication ([place]) 74/71/5953    Korynn Kenedy PT, DPT 11/04/2018, 11:06 PM  [facility] [address] [place], [place], [address] Phone: [phone]   Fax:  [phone]  Name: Eryx Zane MRN: [id number] Date of Birth: 25-Jul-1966

## 2018-11-12 MED FILL — BIKTARVY 50-200-25 MG TABS: 50-200-25 | 30 days supply | Qty: 30 | Fill #9

## 2018-11-16 ENCOUNTER — Ambulatory Visit: Payer: Medicare Other | Admitting: Cardiology

## 2018-11-23 ENCOUNTER — Encounter: Payer: Self-pay | Admitting: *Deleted

## 2018-11-23 ENCOUNTER — Ambulatory Visit: Payer: Medicare Other | Admitting: Gastroenterology

## 2018-11-23 ENCOUNTER — Telehealth (INDEPENDENT_AMBULATORY_CARE_PROVIDER_SITE_OTHER): Payer: Self-pay | Admitting: Orthopedic Surgery

## 2018-11-23 NOTE — Telephone Encounter (Signed)
Patient's wife [name] states patient need Rx sent to [facility] for sleeves. Patient's callback number [phone]

## 2018-11-24 NOTE — Telephone Encounter (Signed)
Order written will have Dr. Sharol Given to sign and fax.

## 2018-12-01 ENCOUNTER — Telehealth (INDEPENDENT_AMBULATORY_CARE_PROVIDER_SITE_OTHER): Payer: Self-pay | Admitting: Orthopedic Surgery

## 2018-12-01 NOTE — Telephone Encounter (Signed)
I called and lm on vm for pt to advise that this order had been faxed 11/24/18 to [facility] but I would fax it again today and leave the original order at the desk for him to pick up as well. To call with questions.

## 2018-12-01 NOTE — Telephone Encounter (Signed)
Patient wife called to state that patient was to receive RX for sleeves<[facility] has not received it  Yet and would like to have it this week.  Please call [name](wife) to advise. [phone]

## 2018-12-07 ENCOUNTER — Other Ambulatory Visit: Payer: Self-pay

## 2018-12-07 ENCOUNTER — Ambulatory Visit (INDEPENDENT_AMBULATORY_CARE_PROVIDER_SITE_OTHER): Payer: Medicare Other | Admitting: Internal Medicine

## 2018-12-07 ENCOUNTER — Encounter: Payer: Self-pay | Admitting: Internal Medicine

## 2018-12-07 VITALS — BP 151/72 | HR 78 | Temp 99.5°F | Ht 77.0 in | Wt 297.1 lb

## 2018-12-07 DIAGNOSIS — E785 Hyperlipidemia, unspecified: Secondary | ICD-10-CM | POA: Diagnosis not present

## 2018-12-07 DIAGNOSIS — Z9861 Coronary angioplasty status: Secondary | ICD-10-CM | POA: Diagnosis not present

## 2018-12-07 DIAGNOSIS — I2510 Atherosclerotic heart disease of native coronary artery without angina pectoris: Secondary | ICD-10-CM

## 2018-12-07 DIAGNOSIS — I120 Hypertensive chronic kidney disease with stage 5 chronic kidney disease or end stage renal disease: Secondary | ICD-10-CM | POA: Diagnosis present

## 2018-12-07 DIAGNOSIS — Z7901 Long term (current) use of anticoagulants: Secondary | ICD-10-CM

## 2018-12-07 DIAGNOSIS — I480 Paroxysmal atrial fibrillation: Secondary | ICD-10-CM

## 2018-12-07 DIAGNOSIS — Z794 Long term (current) use of insulin: Secondary | ICD-10-CM

## 2018-12-07 DIAGNOSIS — Z79899 Other long term (current) drug therapy: Secondary | ICD-10-CM | POA: Diagnosis not present

## 2018-12-07 DIAGNOSIS — I428 Other cardiomyopathies: Secondary | ICD-10-CM | POA: Diagnosis not present

## 2018-12-07 DIAGNOSIS — E118 Type 2 diabetes mellitus with unspecified complications: Secondary | ICD-10-CM

## 2018-12-07 DIAGNOSIS — E1122 Type 2 diabetes mellitus with diabetic chronic kidney disease: Secondary | ICD-10-CM

## 2018-12-07 DIAGNOSIS — I1311 Hypertensive heart and chronic kidney disease without heart failure, with stage 5 chronic kidney disease, or end stage renal disease: Secondary | ICD-10-CM

## 2018-12-07 DIAGNOSIS — Z992 Dependence on renal dialysis: Secondary | ICD-10-CM

## 2018-12-07 DIAGNOSIS — N186 End stage renal disease: Secondary | ICD-10-CM

## 2018-12-07 DIAGNOSIS — I712 Thoracic aortic aneurysm, without rupture: Secondary | ICD-10-CM

## 2018-12-07 DIAGNOSIS — I7121 Aneurysm of the ascending aorta, without rupture: Secondary | ICD-10-CM | POA: Insufficient documentation

## 2018-12-07 MED ORDER — LINAGLIPTIN 5 MG PO TABS: 5.0000 mg | ORAL_TABLET | Freq: Every day | ORAL | 1 refills | Status: DC

## 2018-12-07 NOTE — Assessment & Plan Note (Addendum)
Last left heart catheterization in June 2019 with extensive multivessel calcifications. Echocardiogram in 09/2018 with left ventricular ejection fraction of 35 to 40%, left ventricular hypertrophy, 4.3 cm aortic aneurysm, and left atrial enlargement. Although the patient is end stage renal and there is no proven benefit in end-stage renal patients, he should be on medical management for his CAD. He is currently on a beta blocker and Crestor. I will reach out to nephrology about starting an ACE/ARB.

## 2018-12-07 NOTE — Assessment & Plan Note (Addendum)
Patient with uncontrolled diabetes mellitus. Last A1c 11.4 which likely does not represent his true A1c. It is likely much higher given that he is ESRD. He is currently on NovoLog 10 units BID and Lantus 30 units QHS. Not having any episodes of hypoglycemia and tells me that when he does check his sugars they typically range in the 200-300s. He just got both his prosthetics and is now attempting to exercise more.Patient is not due for repeat A1c today; however, we definitely need to get better control of his diabetes mellitus. In addition to his current insulin regimen we will also add on Trejenta 5 mg daily. Return in three months for further evaluation and management and repeat A1c at that time.

## 2018-12-07 NOTE — Assessment & Plan Note (Addendum)
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 The patient will need repeat echocardiogram to assess for increasing size of his ascending aortic aneurysm. We will repeat it six months from the date of his initial echocardiogram and if stable will monitor it yearly. It will be important that we control the patient's hypertension, hyperlipidemia, and diabetes. If his aneurysm is growing by greater than 0.5 cm in 12 months or gets larger than 5.5 cm he will need surgical intervention. Pending his next echocardiogram he may need a referral to vascular surgery.

## 2018-12-07 NOTE — Assessment & Plan Note (Signed)
Patient with ESRD secondary uncontrolled diabetes. He states that he is being evaluated by the transplant team in [place], associated with [facility]. He was told that he is not a candidate for transplant given his significant CAD. He expresses his dismay and states that he really wants a kidney transplant so he can continue to travel. States that he is started to exercise more and is working to become more active. He continues to follow with [facility].

## 2018-12-07 NOTE — Progress Notes (Signed)
   CC: F/U HTN  HPI:  [name] is a 52 y.o. male who presented to the clinic for continued evaluation and management of his chronic medical illnesses. For a detailed assessment and plan please refer to problem based charting below.   Past Medical History:  Diagnosis Date  . Anemia   . Atrial fibrillation ([place])   . Chest pain with moderate risk for cardiac etiology 06/15/2018  . Chronic combined systolic and diastolic CHF (congestive heart failure) ([place])   . Diabetic foot ulcer ([place]) 09/03/2017  . Diabetic wet gangrene of the foot ([place]) 09/04/2017  . ESRD (end stage renal disease) on dialysis [facility])    [facility] [address], [place], [place] (10/15/2018)  . Headache    "monthly" (06/15/2018)  . HIV disease ([place])   . Hyperparathyroidism, secondary ([place])   . Hypertension   . Hypertensive heart disease with end stage renal disease on dialysis ([place]) 03/13/2017  . Mallory-Weiss tear 2018  . Pneumonia X 1  . Subacute osteomyelitis, right ankle and foot ([place])   . SVT (supraventricular tachycardia) ([place])    ? afib or atrial flutter s/p TEE/DCCV with subsequent ablation due to reoccurrence in [place]  . SVT (supraventricular tachycardia) ([place])    ? afib or atrial flutter s/p TEE/DCCV with subsequent ablation due to reoccurrence in [place]  . Type 2 diabetes mellitus ([place])    Review of Systems:  12 point ROS preformed. All negative aside from those mentioned in the HPI.  Physical Exam: Vitals:   12/07/18 1406  BP: (!) 151/72  Pulse: 78  Temp: 99.5 F (37.5 C)  TempSrc: Oral  SpO2: 95%  Weight: 297 lb 1.6 oz (134.8 kg)  Height: 6\' 5"  (1.956 m)   General: Well nourished male in no acute distress Pulm: Good air movement with no wheezing or crackles  CV: RRR, no murmurs, no rubs  Abdomen: Active bowel sounds, soft, non-distended, no tenderness to palpation   Assessment & Plan:   See Encounters Tab for problem based charting.  Patient discussed with Dr. Rebeca Alert

## 2018-12-07 NOTE — Assessment & Plan Note (Signed)
Patient continues to follow up with cardiology and is continuing on amiodarone, metoprolol, and warfarin. No changes to medical management at this point.

## 2018-12-07 NOTE — Assessment & Plan Note (Addendum)
Patient is currently with uncontrolled hypertension in the setting of the ESRD and diabetes mellitus. He is currently on metoprolol succinate 100 mg daily. In the past during hemodialysis he notes that he has had issues with hypotension. He is not had any episodes of hypertension in several months however he is reluctant to change his medications. Although goal directed therapy for HFrEF is not proven in patients with ESRD we should try to optimize his medications. I think that he would benefit from an ACE/ARB. I will reach out to his nephrologist to see if he agrees with his recommendation.

## 2018-12-07 NOTE — Patient Instructions (Signed)
Thank you for allowing [place] to provide your care. Today we are starting a new medication Trejenta. Please take this as prescribed. I will need to see you back in three months for repeat echocardiogram and recheck your hemoglobin A1c. I will reach out to the nephrologist to discuss further medication management for your hypertension and your heart disease. Please let me know if you have any questions or concerns, otherwise I will see you back in three months.

## 2018-12-08 NOTE — Progress Notes (Signed)
Internal Medicine Clinic Attending  Case discussed with Dr. Helberg at the time of the visit.  We reviewed the resident's history and exam and pertinent patient test results.  I agree with the assessment, diagnosis, and plan of care documented in the resident's note.  Alexander Raines, M.D., Ph.D.  

## 2018-12-09 NOTE — Addendum Note (Signed)
Addended by: Nicola Girt on: 12/09/2018 03:11 PM   Modules accepted: Orders

## 2018-12-10 ENCOUNTER — Encounter: Payer: Medicare Other | Admitting: Internal Medicine

## 2018-12-11 MED FILL — BIKTARVY 50-200-25 MG TABS: 50-200-25 | 30 days supply | Qty: 30 | Fill #10

## 2018-12-14 ENCOUNTER — Ambulatory Visit: Payer: Medicare Other

## 2018-12-15 NOTE — Addendum Note (Signed)
Addended by: Hulan Fray on: 12/15/2018 04:57 PM   Modules accepted: Orders

## 2018-12-18 LAB — PROTIME-INR: INR: 3.1 — AB (ref 0.9–1.1)

## 2018-12-21 ENCOUNTER — Telehealth: Payer: Self-pay | Admitting: Pharmacist

## 2018-12-21 NOTE — Telephone Encounter (Signed)
Received INR result from standing labs at dialysis INR was 3.14. Pt is overdue and usually seen in office. Spoke with patient and advised to have a serving of greens and continue on same dose. Appt made for in office on Friday 12/25/18.

## 2018-12-25 ENCOUNTER — Ambulatory Visit (INDEPENDENT_AMBULATORY_CARE_PROVIDER_SITE_OTHER): Payer: Medicare Other | Admitting: *Deleted

## 2018-12-25 DIAGNOSIS — Z5181 Encounter for therapeutic drug level monitoring: Secondary | ICD-10-CM

## 2018-12-25 DIAGNOSIS — I4891 Unspecified atrial fibrillation: Secondary | ICD-10-CM | POA: Diagnosis not present

## 2018-12-25 LAB — POCT INR: INR: 2.5 (ref 2.0–3.0)

## 2018-12-25 MED ORDER — WARFARIN SODIUM 7.5 MG PO TABS: ORAL_TABLET | ORAL | 0 refills | Status: DC

## 2018-12-25 NOTE — Patient Instructions (Signed)
Description   Continue taking 1/2 tablet daily except 1 tablet on Mondays, Wednesdays, and Saturdays.  Recheck INR in 4 weeks.  [facility] [phone] Main [phone]

## 2018-12-31 LAB — POCT INR: INR: 3 (ref 2.0–3.0)

## 2019-01-04 ENCOUNTER — Encounter: Payer: Self-pay | Admitting: Cardiovascular Disease

## 2019-01-04 DIAGNOSIS — I4891 Unspecified atrial fibrillation: Secondary | ICD-10-CM

## 2019-01-04 DIAGNOSIS — Z5181 Encounter for therapeutic drug level monitoring: Secondary | ICD-10-CM

## 2019-01-04 NOTE — Progress Notes (Signed)
This encounter was created in error - please disregard.

## 2019-01-06 NOTE — Addendum Note (Signed)
Addended by: Nicola Girt on: 01/06/2019 09:59 AM   Modules accepted: Orders

## 2019-01-07 LAB — POCT INR: INR: 3.9 — AB (ref 2.0–3.0)

## 2019-01-07 LAB — PROTIME-INR: INR: 3.9 — AB (ref <=1.1)

## 2019-01-11 ENCOUNTER — Encounter (INDEPENDENT_AMBULATORY_CARE_PROVIDER_SITE_OTHER): Payer: Medicare Other | Admitting: Cardiology

## 2019-01-11 DIAGNOSIS — Z5181 Encounter for therapeutic drug level monitoring: Secondary | ICD-10-CM | POA: Diagnosis not present

## 2019-01-11 DIAGNOSIS — I4891 Unspecified atrial fibrillation: Secondary | ICD-10-CM | POA: Diagnosis not present

## 2019-01-11 NOTE — Patient Instructions (Signed)
Description   Received INR from [facility] drawn on 01/07/19, received on 01/11/19.  Called [facility] spoke with RN and asked them not to draw or fax INR's to our office, pt comes into clinic for INR checks. Called spoke with pt, advised to skip today's dosage of Coumadin, then resume same dosage 1/2 tablet daily except 1 tablet on Mondays, Wednesdays, and Saturdays.  Recheck INR in 4 weeks.  [facility] [phone] Main [phone]

## 2019-01-11 NOTE — Progress Notes (Signed)
This encounter was created in error - please disregard.

## 2019-01-22 ENCOUNTER — Inpatient Hospital Stay (HOSPITAL_COMMUNITY)
Admission: EM | Admit: 2019-01-22 | Discharge: 2019-01-26 | DRG: 286 | Disposition: A | Discharge status: Home / Self Care | Payer: Medicare Other | Attending: Internal Medicine | Admitting: Internal Medicine

## 2019-01-22 ENCOUNTER — Encounter (HOSPITAL_COMMUNITY): Payer: Self-pay | Admitting: Cardiology

## 2019-01-22 ENCOUNTER — Encounter: Payer: Self-pay | Admitting: Cardiology

## 2019-01-22 ENCOUNTER — Ambulatory Visit (INDEPENDENT_AMBULATORY_CARE_PROVIDER_SITE_OTHER): Payer: Medicare Other | Admitting: Cardiology

## 2019-01-22 ENCOUNTER — Emergency Department (HOSPITAL_COMMUNITY): Payer: Medicare Other

## 2019-01-22 ENCOUNTER — Telehealth: Payer: Self-pay | Admitting: Pharmacy Technician

## 2019-01-22 VITALS — BP 146/82 | HR 112 | Ht 77.0 in | Wt 286.0 lb

## 2019-01-22 DIAGNOSIS — I132 Hypertensive heart and chronic kidney disease with heart failure and with stage 5 chronic kidney disease, or end stage renal disease: Secondary | ICD-10-CM | POA: Diagnosis present

## 2019-01-22 DIAGNOSIS — E118 Type 2 diabetes mellitus with unspecified complications: Secondary | ICD-10-CM | POA: Diagnosis present

## 2019-01-22 DIAGNOSIS — I340 Nonrheumatic mitral (valve) insufficiency: Secondary | ICD-10-CM | POA: Diagnosis not present

## 2019-01-22 DIAGNOSIS — I25119 Atherosclerotic heart disease of native coronary artery with unspecified angina pectoris: Secondary | ICD-10-CM

## 2019-01-22 DIAGNOSIS — I2583 Coronary atherosclerosis due to lipid rich plaque: Secondary | ICD-10-CM

## 2019-01-22 DIAGNOSIS — I10 Essential (primary) hypertension: Secondary | ICD-10-CM | POA: Diagnosis not present

## 2019-01-22 DIAGNOSIS — K219 Gastro-esophageal reflux disease without esophagitis: Secondary | ICD-10-CM | POA: Diagnosis present

## 2019-01-22 DIAGNOSIS — Z89511 Acquired absence of right leg below knee: Secondary | ICD-10-CM | POA: Diagnosis not present

## 2019-01-22 DIAGNOSIS — Z833 Family history of diabetes mellitus: Secondary | ICD-10-CM

## 2019-01-22 DIAGNOSIS — I351 Nonrheumatic aortic (valve) insufficiency: Secondary | ICD-10-CM | POA: Diagnosis not present

## 2019-01-22 DIAGNOSIS — Z992 Dependence on renal dialysis: Secondary | ICD-10-CM

## 2019-01-22 DIAGNOSIS — Z8249 Family history of ischemic heart disease and other diseases of the circulatory system: Secondary | ICD-10-CM | POA: Diagnosis not present

## 2019-01-22 DIAGNOSIS — Z7901 Long term (current) use of anticoagulants: Secondary | ICD-10-CM | POA: Diagnosis not present

## 2019-01-22 DIAGNOSIS — I5022 Chronic systolic (congestive) heart failure: Secondary | ICD-10-CM

## 2019-01-22 DIAGNOSIS — I081 Rheumatic disorders of both mitral and tricuspid valves: Secondary | ICD-10-CM | POA: Diagnosis not present

## 2019-01-22 DIAGNOSIS — Z885 Allergy status to narcotic agent status: Secondary | ICD-10-CM | POA: Diagnosis not present

## 2019-01-22 DIAGNOSIS — E1122 Type 2 diabetes mellitus with diabetic chronic kidney disease: Secondary | ICD-10-CM | POA: Diagnosis present

## 2019-01-22 DIAGNOSIS — I1311 Hypertensive heart and chronic kidney disease without heart failure, with stage 5 chronic kidney disease, or end stage renal disease: Secondary | ICD-10-CM | POA: Diagnosis not present

## 2019-01-22 DIAGNOSIS — I255 Ischemic cardiomyopathy: Secondary | ICD-10-CM | POA: Diagnosis not present

## 2019-01-22 DIAGNOSIS — E785 Hyperlipidemia, unspecified: Secondary | ICD-10-CM | POA: Insufficient documentation

## 2019-01-22 DIAGNOSIS — E1151 Type 2 diabetes mellitus with diabetic peripheral angiopathy without gangrene: Secondary | ICD-10-CM | POA: Diagnosis present

## 2019-01-22 DIAGNOSIS — R079 Chest pain, unspecified: Secondary | ICD-10-CM | POA: Diagnosis not present

## 2019-01-22 DIAGNOSIS — B20 Human immunodeficiency virus [HIV] disease: Secondary | ICD-10-CM | POA: Diagnosis present

## 2019-01-22 DIAGNOSIS — I480 Paroxysmal atrial fibrillation: Secondary | ICD-10-CM | POA: Diagnosis present

## 2019-01-22 DIAGNOSIS — I25110 Atherosclerotic heart disease of native coronary artery with unstable angina pectoris: Secondary | ICD-10-CM | POA: Diagnosis present

## 2019-01-22 DIAGNOSIS — Z89512 Acquired absence of left leg below knee: Secondary | ICD-10-CM | POA: Diagnosis not present

## 2019-01-22 DIAGNOSIS — I272 Pulmonary hypertension, unspecified: Secondary | ICD-10-CM | POA: Diagnosis not present

## 2019-01-22 DIAGNOSIS — N2581 Secondary hyperparathyroidism of renal origin: Secondary | ICD-10-CM | POA: Diagnosis present

## 2019-01-22 DIAGNOSIS — Z79899 Other long term (current) drug therapy: Secondary | ICD-10-CM

## 2019-01-22 DIAGNOSIS — I420 Dilated cardiomyopathy: Secondary | ICD-10-CM | POA: Diagnosis present

## 2019-01-22 DIAGNOSIS — I4891 Unspecified atrial fibrillation: Secondary | ICD-10-CM | POA: Diagnosis not present

## 2019-01-22 DIAGNOSIS — I5042 Chronic combined systolic (congestive) and diastolic (congestive) heart failure: Secondary | ICD-10-CM | POA: Diagnosis present

## 2019-01-22 DIAGNOSIS — I200 Unstable angina: Secondary | ICD-10-CM | POA: Diagnosis present

## 2019-01-22 DIAGNOSIS — E7800 Pure hypercholesterolemia, unspecified: Secondary | ICD-10-CM

## 2019-01-22 DIAGNOSIS — I484 Atypical atrial flutter: Secondary | ICD-10-CM | POA: Diagnosis not present

## 2019-01-22 DIAGNOSIS — I4892 Unspecified atrial flutter: Secondary | ICD-10-CM | POA: Diagnosis present

## 2019-01-22 DIAGNOSIS — I712 Thoracic aortic aneurysm, without rupture: Secondary | ICD-10-CM

## 2019-01-22 DIAGNOSIS — I4581 Long QT syndrome: Secondary | ICD-10-CM | POA: Diagnosis not present

## 2019-01-22 DIAGNOSIS — Z794 Long term (current) use of insulin: Secondary | ICD-10-CM | POA: Diagnosis not present

## 2019-01-22 DIAGNOSIS — I2510 Atherosclerotic heart disease of native coronary artery without angina pectoris: Secondary | ICD-10-CM

## 2019-01-22 DIAGNOSIS — N186 End stage renal disease: Secondary | ICD-10-CM | POA: Diagnosis present

## 2019-01-22 DIAGNOSIS — I7121 Aneurysm of the ascending aorta, without rupture: Secondary | ICD-10-CM

## 2019-01-22 DIAGNOSIS — I361 Nonrheumatic tricuspid (valve) insufficiency: Secondary | ICD-10-CM | POA: Diagnosis not present

## 2019-01-22 DIAGNOSIS — D631 Anemia in chronic kidney disease: Secondary | ICD-10-CM | POA: Diagnosis present

## 2019-01-22 DIAGNOSIS — I428 Other cardiomyopathies: Secondary | ICD-10-CM

## 2019-01-22 HISTORY — DX: Atherosclerotic heart disease of native coronary artery without angina pectoris: I25.10

## 2019-01-22 LAB — CBC
HEMATOCRIT: 33.3 % — AB (ref 39.0–52.0)
Hemoglobin: 11 g/dL — ABNORMAL LOW (ref 13.0–17.0)
MCH: 31.6 pg (ref 26.0–34.0)
MCHC: 33 g/dL (ref 30.0–36.0)
MCV: 95.7 fL (ref 80.0–100.0)
Platelets: 197 10*3/uL (ref 150–400)
RBC: 3.48 MIL/uL — ABNORMAL LOW (ref 4.22–5.81)
RDW: 14.3 % (ref 11.5–15.5)
WBC: 6.6 10*3/uL (ref 4.0–10.5)
nRBC: 0 % (ref 0.0–0.2)

## 2019-01-22 LAB — BASIC METABOLIC PANEL
Anion gap: 15 (ref 5–15)
BUN: 30 mg/dL — ABNORMAL HIGH (ref 6–20)
CO2: 24 mmol/L (ref 22–32)
Calcium: 8.6 mg/dL — ABNORMAL LOW (ref 8.9–10.3)
Chloride: 95 mmol/L — ABNORMAL LOW (ref 98–111)
Creatinine, Ser: 8.95 mg/dL — ABNORMAL HIGH (ref 0.61–1.24)
GFR calc Af Amer: 7 mL/min — ABNORMAL LOW (ref >60)
GFR calc non Af Amer: 6 mL/min — ABNORMAL LOW (ref >60)
GLUCOSE: 298 mg/dL — AB (ref 70–99)
Potassium: 4.3 mmol/L (ref 3.5–5.1)
Sodium: 134 mmol/L — ABNORMAL LOW (ref 135–145)

## 2019-01-22 LAB — I-STAT TROPONIN, ED: Troponin i, poc: 0.1 ng/mL (ref 0.00–0.08)

## 2019-01-22 LAB — TROPONIN I: Troponin I: 0.09 ng/mL (ref <0.03)

## 2019-01-22 LAB — CBG MONITORING, ED: Glucose-Capillary: 347 mg/dL — ABNORMAL HIGH (ref 70–99)

## 2019-01-22 LAB — PROTIME-INR
INR: 3
Prothrombin Time: 30.7 seconds — ABNORMAL HIGH (ref 11.4–15.2)

## 2019-01-22 LAB — TSH: TSH: 1.48 u[IU]/mL (ref 0.350–4.500)

## 2019-01-22 LAB — MAGNESIUM: MAGNESIUM: 2.1 mg/dL (ref 1.7–2.4)

## 2019-01-22 MED ORDER — INSULIN ASPART 100 UNIT/ML ~~LOC~~ SOLN: 5.0000 [IU] | Freq: Three times a day (TID) | SUBCUTANEOUS | Status: DC

## 2019-01-22 MED ORDER — INSULIN GLARGINE 100 UNIT/ML ~~LOC~~ SOLN
15.0000 [IU] | Freq: Every day | SUBCUTANEOUS | Status: DC
  Administered 2019-01-22: 15 [IU] via SUBCUTANEOUS
  Filled 2019-01-22: qty 0.15

## 2019-01-22 MED ORDER — BICTEGRAVIR-EMTRICITAB-TENOFOV 50-200-25 MG PO TABS
1.0000 | ORAL_TABLET | Freq: Every day | ORAL | Status: DC
  Filled 2019-01-22: qty 1

## 2019-01-22 MED ORDER — ROSUVASTATIN CALCIUM 5 MG PO TABS
10.0000 mg | ORAL_TABLET | Freq: Every day | ORAL | Status: DC
  Administered 2019-01-23 – 2019-01-26 (×3): 10 mg via ORAL
  Filled 2019-01-22 (×3): qty 2

## 2019-01-22 MED ORDER — RAMELTEON 8 MG PO TABS
8.0000 mg | ORAL_TABLET | Freq: Every day | ORAL | Status: DC
  Administered 2019-01-23 – 2019-01-25 (×4): 8 mg via ORAL
  Filled 2019-01-22 (×4): qty 1

## 2019-01-22 MED ORDER — AMIODARONE HCL 200 MG PO TABS: 200.0000 mg | ORAL_TABLET | Freq: Every day | ORAL | Status: DC

## 2019-01-22 MED ORDER — SODIUM CHLORIDE 0.9% FLUSH
3.0000 mL | Freq: Once | INTRAVENOUS | Status: AC
  Administered 2019-01-23: 3 mL via INTRAVENOUS

## 2019-01-22 MED ORDER — METOPROLOL SUCCINATE ER 100 MG PO TB24
100.0000 mg | ORAL_TABLET | Freq: Every day | ORAL | Status: DC
  Administered 2019-01-23 – 2019-01-26 (×3): 100 mg via ORAL
  Filled 2019-01-22 (×3): qty 1

## 2019-01-22 MED ORDER — PANTOPRAZOLE SODIUM 40 MG PO TBEC
40.0000 mg | DELAYED_RELEASE_TABLET | Freq: Two times a day (BID) | ORAL | Status: DC
  Administered 2019-01-22 – 2019-01-26 (×7): 40 mg via ORAL
  Filled 2019-01-22 (×7): qty 1

## 2019-01-22 MED ORDER — INSULIN ASPART 100 UNIT/ML ~~LOC~~ SOLN
0.0000 [IU] | Freq: Every day | SUBCUTANEOUS | Status: DC
  Administered 2019-01-22: 4 [IU] via SUBCUTANEOUS
  Administered 2019-01-23: 5 [IU] via SUBCUTANEOUS
  Administered 2019-01-24: 2 [IU] via SUBCUTANEOUS
  Administered 2019-01-25: 3 [IU] via SUBCUTANEOUS

## 2019-01-22 MED ORDER — INSULIN ASPART 100 UNIT/ML ~~LOC~~ SOLN
0.0000 [IU] | Freq: Three times a day (TID) | SUBCUTANEOUS | Status: DC
  Administered 2019-01-23: 15 [IU] via SUBCUTANEOUS
  Administered 2019-01-23: 3 [IU] via SUBCUTANEOUS
  Administered 2019-01-24: 2 [IU] via SUBCUTANEOUS
  Administered 2019-01-24: 8 [IU] via SUBCUTANEOUS
  Administered 2019-01-24: 11 [IU] via SUBCUTANEOUS
  Administered 2019-01-25: 3 [IU] via SUBCUTANEOUS
  Administered 2019-01-25: 8 [IU] via SUBCUTANEOUS
  Administered 2019-01-26: 3 [IU] via SUBCUTANEOUS

## 2019-01-22 MED ORDER — ONDANSETRON HCL 4 MG/2ML IJ SOLN
4.0000 mg | Freq: Once | INTRAMUSCULAR | Status: AC
  Administered 2019-01-22: 4 mg via INTRAVENOUS
  Filled 2019-01-22: qty 2

## 2019-01-22 MED ORDER — NITROGLYCERIN 0.4 MG SL SUBL: 0.4000 mg | SUBLINGUAL_TABLET | SUBLINGUAL | Status: DC | PRN

## 2019-01-22 MED ORDER — MORPHINE SULFATE (PF) 4 MG/ML IV SOLN
4.0000 mg | Freq: Once | INTRAVENOUS | Status: AC
  Administered 2019-01-22: 4 mg via INTRAVENOUS
  Filled 2019-01-22: qty 1

## 2019-01-22 MED ORDER — INSULIN ASPART 100 UNIT/ML ~~LOC~~ SOLN: 3.0000 [IU] | Freq: Three times a day (TID) | SUBCUTANEOUS | Status: DC

## 2019-01-22 MED FILL — BIKTARVY 50-200-25 MG TABS: 50-200-25 | 30 days supply | Qty: 30 | Fill #11

## 2019-01-22 NOTE — H&P (Deleted)
Cardiology Office Note:   Date: 01/22/2019  ID: Jayvien Rowlette, DOB [birth year], MRN [id number]  PCP: [name], MD  Cardiologist: Fransico Him, MD  Referring MD: [name], MD      Chief Complaint  Patient presents with  . Follow-up    CAD, CHF   History of Present Illness:   Shaydon Lease is a 53 y.o. male with a hx of S-D-CHF, Aflutter on amio and coumadin, CAD w/ med mgt, ESRD on HD, DM, HTN, HLD w/ elev trig, bilat BKA, HIV, PAD.  The patient was hospitalized in June 2019 and underwent cardiac cath that revealed 30% proximal LAD and 80% mid LAD, 95% ostial D3, 90% distal LAD, occluded D1, sat 95% acute marginal, occluded first RPL B, 95% posterior atrial branch and mild to moderate LV dysfunction EF 40 to 45%. His LVEDP was normal at that time. Medical management was recommended because other than the mid LAD and first diagonal all other disease was in smaller distal branches and the first diagonal was occluded. It was felt that the LAD lesion should not be treated unless he had refractory angina on optimal medical therapy.  He was readmitted in October 2019 with complaints of chest pain. This awakened him from sleep. He went to dialysis later that day where his pressure dropped and he developed chest pain again. There was a minimal bump in his troponin which was felt consistent with his end-stage renal disease with some demand ischemia related to stress of dialysis and hypotension. Cath was recommended but he refused. Stress testing was not felt to add any further information given that we already knew his coronary anatomy.  He is here today for followup. He tells me initially when he got to the office he was feeling okay but while sitting in the examining room he started developing chest pressure in the middle of his chest radiating to the left side. Says all of a sudden he did not feel good. He looks very uncomfortable sitting in the chair. He is concerned that he may have gone  into atrial fibrillation. He tells me that since his hospital admission in October he is actually done quite well. This is the first time his had chest pain since then. He is also not noticed any palpitations since then either. He has been doing fairly well in dialysis as well without any recurrent chest pain. He feels short of breath today but has not noticed any lower extremity edema.      Past Medical History:  Diagnosis Date  . Anemia   . Atrial fibrillation ([place])   . CAD (coronary artery disease), native coronary artery 01/22/2019  . Chest pain with moderate risk for cardiac etiology 06/15/2018  . Chronic combined systolic and diastolic CHF (congestive heart failure) ([place])   . Diabetic foot ulcer ([place]) 09/03/2017  . Diabetic wet gangrene of the foot ([place]) 09/04/2017  . ESRD (end stage renal disease) on dialysis [facility])    [place] [address] [place], [place] (10/15/2018)  . Headache    "monthly" (06/15/2018)  . HIV disease ([place])   . Hyperlipidemia LDL goal <70 01/22/2019  . Hyperparathyroidism, secondary ([place])   . Hypertension   . Hypertensive heart disease with end stage renal disease on dialysis ([place]) 03/13/2017  . Mallory-Weiss tear 2018  . Pneumonia X 1  . Subacute osteomyelitis, right ankle and foot ([place])   . SVT (supraventricular tachycardia) ([place])    ? afib or atrial flutter s/p TEE/DCCV with  subsequent ablation due to reoccurrence in [place]  . SVT (supraventricular tachycardia) ([place])    ? afib or atrial flutter s/p TEE/DCCV with subsequent ablation due to reoccurrence in [place]  . Type 2 diabetes mellitus ([place])         Past Surgical History:  Procedure Laterality Date  . AMPUTATION Left    foot  . AMPUTATION Right 12/06/2017   Procedure: AMPUTATION BELOW KNEE; Surgeon: [name], MD; Location: [place]; Service: Orthopedics; Laterality: Right;  . APPLICATION OF WOUND VAC Right 09/04/2017   Procedure: APPLICATION OF WOUND VAC; Surgeon: [name], DPM; Location: [place]; Service:  Podiatry; Laterality: Right;  . APPLICATION OF WOUND VAC  12/06/2017   Procedure: APPLICATION OF WOUND VAC; Surgeon: [name], MD; Location: [place]; Service: Orthopedics;;  . AV FISTULA PLACEMENT Right 10/19/2012  . AV FISTULA REPAIR Right ~ 02/2018   "had it cleaned out"  . BELOW KNEE LEG AMPUTATION Left ~ 2016  . COLONOSCOPY WITH PROPOFOL N/A 07/30/2017   Procedure: COLONOSCOPY WITH PROPOFOL; Surgeon: [name], MD; Location: [place]; Service: Endoscopy; Laterality: N/A;  . ESOPHAGOGASTRODUODENOSCOPY (EGD) WITH PROPOFOL N/A 07/28/2017   Procedure: ESOPHAGOGASTRODUODENOSCOPY (EGD) WITH PROPOFOL; Surgeon: [name], MD; Location: [place]; Service: Endoscopy; Laterality: N/A;  . ESOPHAGOGASTRODUODENOSCOPY (EGD) WITH PROPOFOL N/A 01/15/2018   Procedure: ESOPHAGOGASTRODUODENOSCOPY (EGD) WITH PROPOFOL; Surgeon: [name], MD; Location: [facility] ENDOSCOPY; Service: Endoscopy; Laterality: N/A;  . FLEXIBLE SIGMOIDOSCOPY N/A 07/27/2017   Procedure: FLEXIBLE SIGMOIDOSCOPY; Surgeon: [name], MD; Location: [facility] ENDOSCOPY; Service: Endoscopy; Laterality: N/A;  . GRAFT APPLICATION Right 31/51/7616   Procedure: SKIN GRAFT APPLICATION RIGHT FOOT; Surgeon: [name], DPM; Location: [place]; Service: Podiatry; Laterality: Right;  . I&D EXTREMITY Right 09/04/2017   Procedure: IRRIGATION AND DEBRIDEMENT EXTREMITY; Surgeon: [name], DPM; Location: [place]; Service: Podiatry; Laterality: Right;  . I&D EXTREMITY Right 11/12/2017   Procedure: IRRIGATION AND DEBRIDEMENT ULCER RIGHT FOOT; Surgeon: [name], DPM; Location: [place]; Service: Podiatry; Laterality: Right;  . LEFT HEART CATH AND CORONARY ANGIOGRAPHY N/A 06/18/2018   Procedure: LEFT HEART CATH AND CORONARY ANGIOGRAPHY; Surgeon: [name], Peter M, MD; Location: [place] CV LAB; Service: Cardiovascular; Laterality: N/A;  . WOUND DEBRIDEMENT N/A 09/24/2017   Procedure: DEBRIDEMENT WOUND; Surgeon: [name], DPM;  Location: [place]; Service: Podiatry; Laterality: N/A;   Current Medications:  Active Medications                                                                                     Allergies: Oxycodone-acetaminophen  Social History        Socioeconomic History  . Marital status: Married    Spouse name: kim  . Number of children: 3  . Years of education: college  . Highest education level: Not on file  Occupational History  . Occupation: disabled  Social Needs  . Financial resource strain: Not on file  . Food insecurity:    Worry: Not on file    Inability: Not on file  . Transportation needs:    Medical: Not on file    Non-medical: Not on file  Tobacco Use  . Smoking status: Never Smoker  .  Smokeless tobacco: Never Used  Substance and Sexual Activity  . Alcohol use: Never    Frequency: Never  . Drug use: Never  . Sexual activity: Not Currently  Lifestyle  . Physical activity:    Days per week: Not on file    Minutes per session: Not on file  . Stress: Not on file  Relationships  . Social connections:    Talks on phone: Not on file    Gets together: Not on file    Attends religious service: Not on file    Active member of club or organization: Not on file    Attends meetings of clubs or organizations: Not on file    Relationship status: Not on file  Other Topics Concern  . Not on file  Social History Narrative   Lives in [place] with wife.   Family History:  The patient's family history includes Diabetes in his mother; Heart disease in his father and mother. There is no history of Cancer.  ROS:  Please see the history of present illness.  ROS  All other systems reviewed and negative.  EKGs/Labs/Other Studies Reviewed:   The following studies were reviewed today:  Hospital notes from 05/2018 and 09/2018 as well as cardiac cath  EKG: EKG is ordered today showed atypical atrial flutter with 2-1 block at 104 bpm  Recent Labs:  06/17/2018:  Magnesium 2.2  08/05/2018: ALT 21; TSH 1.600  01/22/2019: BUN 30; Creatinine, Ser 8.95; Hemoglobin 11.0; Platelets 197; Potassium 4.3; Sodium 134  Recent Lipid Panel  Labs (Brief)                                                     Physical Exam:   VS: BP (!) 146/82  Pulse (!) 112  Ht 6\' 5"  (1.956 m)  Wt 286 lb (129.7 kg)  SpO2 94%  BMI 33.91 kg/m     Wt Readings from Last 3 Encounters:  01/22/19 286 lb (129.7 kg)  12/07/18 297 lb 1.6 oz (134.8 kg)  10/26/18 293 lb 4.8 oz (133 kg)   GEN: Well nourished, well developed in no acute distress  HEENT: Normal  NECK: No JVD; No carotid bruits  LYMPHATICS: No lymphadenopathy  CARDIAC: RRR, no murmurs, rubs, gallops  RESPIRATORY: Clear to auscultation without rales, wheezing or rhonchi  ABDOMEN: Soft, non-tender, non-distended  MUSCULOSKELETAL: No edema; No deformity  SKIN: Warm and dry  NEUROLOGIC: Alert and oriented x 3  PSYCHIATRIC: Normal affect  ASSESSMENT:    1. Unstable angina pectoris ([place])   2. Coronary artery disease involving native coronary artery of native heart with angina pectoris ([place])   3. PAF (paroxysmal atrial fibrillation) ([place])   4. Essential hypertension   5. Non-ischemic cardiomyopathy ([place])   6. Chronic systolic heart failure ([place])   7. Ascending aortic aneurysm ([place])   8. Hyperlipidemia LDL goal <70    PLAN:   In order of problems listed above:   1. Chest pain - he is actually been doing well up until today in the office when he had sudden onset of severe chest pain midsternal with radiation to the left chest. EKG was done which showed some mild ST depression in lateral precordial leads but otherwise no evidence of STEMI. He has known CAD that is quite severe but in distal territories except for the mid LAD  lesion. It was recommended on cath in the past that PCI not be pursued unless the patient had recurrent chest pain and then possible PCI atherectomy of the mid LAD could be pursued. He is quite  uncomfortable in the office today with his chest discomfort and feels very bad.  -Will plan to admit him to the hospital telemetry unit.  -start him on IV nitro drip as blood pressure tolerates  -Hold Coumadin and start IV heparin drip once INR less than 2  -Cycle troponin every 6 hours x3  -Check 2D echocardiogram to rule out pericardial effusion and assess wall motion  -Consider repeat cardiac cath with possible PCI of the LAD since this is now his second admission for chest pain  -Start aspirin 81 mg daily and continue statin.   2. ASCAD - cardiac cath 05/2018 revealed 30% proximal LAD and 80% mid LAD, 95% ostial D3, 90% distal LAD, occluded D1, sat 95% acute marginal, occluded first RPLB, 95% posterior atrial branch and mild to moderate LV dysfunction EF 40 to 45%. His LVEDP was normal at that time. Medical management was recommended because other than the mid LAD and first diagonal all other disease was in smaller distal branches and the first diagonal was occluded. It was felt that the LAD lesion should not be treated unless he had refractory angina on optimal medical therapy.  -Continue Toprol-XL 100 mg daily  -Aspirin 81 mg daily  -Continue statin increase Crestor to 40 mg daily   3. PAF -he suddenly started feeling poorly in the office and said he thought he might of gone back into A. fib. EKG shows atypical atrial flutter with RVR and 2-1 block at 105 bpm. Unclear if this is actually the etiology of his current symptoms of chest pain and shortness of breath since his heart rates fairly well controlled but given his LV dysfunction the conversion to atrial flutter and loss of atrial kick may be causing some of his symptoms. He will continue on amiodarone 200 mg daily, Toprol-XL 100 mg daily and Coumadin. Is not on a DOAC due to end-stage renal disease on hemodialysis. His INR has been greater than 2.5 since December 19th so could do cardioversion while hospitalized after ischemic work-up  completed.   4. Hypertension -BP is well controlled on exam today.  -He will continue on Toprol-XL 100 mg daily.   5. Dilated cardiomyopathy -repeat 2D echocardiogram 10/16/2018 showed improvement in LV function. He has severe LVH and an EF of 35 to 40% with inferolateral wall hypokinesis.   6. Chronic systolic heart failure -he appears euvolemic on exam today. Weight is stable and actually down 9 pounds from December.  -He will continue on Toprol-XL 100 mg daily.  -He is not on an ACEI/ARB/ARNI due to end-stage renal disease and hypotension during dialysis.  - His volume is managed by dialysis.  -Would benefit from the addition of hydralazine/Imdur but not sure blood pressure will tolerate on hemodialysis days   7. Ascending aortic aneurysm -2D echo showed a sending aortic aneurysm at 43 mmHg.  -A 2D echocardiogram is pending 09/2019 to make sure this is stable.  -Continue statin therapy.   8. Hyperlipidemia with LDL goal less than 70  - his LDL was 62 on 03/10/2017 but then could not be calculated on repeat 06/18/2018 where his triglycerides were 596 and LDL could not be calculated..  - I will recheck an FLP and ALT in a.m.  -increase Crestor to 40 mg daily.   Signed,  Fransico Him, MD  01/22/2019 5:15 PM  Echo

## 2019-01-22 NOTE — Patient Instructions (Addendum)
Medication Instructions:  Your physician recommends that you continue on your current medications as directed. Please refer to the Current Medication list given to you today.  If you need a refill on your cardiac medications before your next appointment, please call your pharmacy.   Lab work: None If you have labs (blood work) drawn today and your tests are completely normal, you will receive your results only by: Marland Kitchen MyChart Message (if you have MyChart) OR . A paper copy in the mail If you have any lab test that is abnormal or we need to change your treatment, we will call you to review the results.  Testing/Procedures: Your physician has recommended that you have a pulmonary function test. Pulmonary Function Tests are a group of tests that measure how well air moves in and out of your lungs.

## 2019-01-22 NOTE — ED Notes (Signed)
No chest pain

## 2019-01-22 NOTE — Progress Notes (Signed)
Cardiology Office Note:    Date:  01/22/2019   ID:  Thomas [name] [name], DOB 04-Apr-1966, MRN [id number]  PCP:  [name], MD  Cardiologist:  Fransico Him, MD    Referring MD: [name], MD   Chief Complaint  Patient presents with  . Follow-up    CAD, CHF    History of Present Illness:    Thomas [name] [name] is a 53 y.o. male with a hx of S-D-CHF, Aflutter on amio and coumadin, CAD w/ med mgt, ESRD on HD, DM, HTN, HLD w/ elev trig, bilat BKA, HIV, PAD.   The patient was hospitalized in June 2019 and underwent cardiac cath that revealed 30% proximal LAD and 80% mid LAD, 95% ostial D3, 90% distal LAD, occluded D1, sat 95% acute marginal, occluded first RPL B, 95% posterior atrial branch and mild to moderate LV dysfunction EF 40 to 45%.  His LVEDP was normal at that time.  Medical management was recommended because other than the mid LAD and first diagonal all other disease was in smaller distal branches and the first diagonal was occluded.  It was felt that the LAD lesion should not be treated unless he had refractory angina on optimal medical therapy.  He was readmitted in October 2019 with complaints of chest pain.  This awakened him from sleep.  He went to dialysis later that day where his pressure dropped and he developed chest pain again.  There was a minimal bump in his troponin which was felt consistent with his end-stage renal disease with some demand ischemia related to stress of dialysis and hypotension.  Cath was recommended but he refused.  Stress testing was not felt to add any further information given that we already knew his coronary anatomy.  He is here today for followup.  He tells me initially when he got to the office he was feeling okay but while sitting in the examining room he started developing chest pressure in the middle of his chest radiating to the left side.  Says all of a sudden he did not feel good.  He looks very uncomfortable sitting in the chair.  He is concerned  that he may have gone into atrial fibrillation.  He tells me that since his hospital admission in October he is actually done quite well.  This is the first time his had chest pain since then.  He is also not noticed any palpitations since then either.  He has been doing fairly well in dialysis as well without any recurrent chest pain.  He feels short of breath today but has not noticed any lower extremity edema.  Past Medical History:  Diagnosis Date  . Anemia   . Atrial fibrillation ([place])   . CAD (coronary artery disease), native coronary artery 01/22/2019  . Chest pain with moderate risk for cardiac etiology 06/15/2018  . Chronic combined systolic and diastolic CHF (congestive heart failure) ([place])   . Diabetic foot ulcer ([place]) 09/03/2017  . Diabetic wet gangrene of the foot ([place]) 09/04/2017  . ESRD (end stage renal disease) on dialysis [facility])    [address] [place], [place] (10/15/2018)  . Headache    "monthly" (06/15/2018)  . HIV disease ([place])   . Hyperlipidemia LDL goal <70 01/22/2019  . Hyperparathyroidism, secondary ([place])   . Hypertension   . Hypertensive heart disease with end stage renal disease on dialysis ([place]) 03/13/2017  . Mallory-Weiss tear 2018  . Pneumonia X 1  . Subacute osteomyelitis, right ankle and foot (  [place])   . SVT (supraventricular tachycardia) ([place])    ? afib or atrial flutter s/p TEE/DCCV with subsequent ablation due to reoccurrence in [place]  . SVT (supraventricular tachycardia) ([place])    ? afib or atrial flutter s/p TEE/DCCV with subsequent ablation due to reoccurrence in [place]  . Type 2 diabetes mellitus ([name])     Past Surgical History:  Procedure Laterality Date  . AMPUTATION Left    foot  . AMPUTATION Right 12/06/2017   Procedure: AMPUTATION BELOW KNEE;  Surgeon: [name], MD;  Location: [place];  Service: Orthopedics;  Laterality: Right;  . APPLICATION OF WOUND VAC Right 09/04/2017   Procedure: APPLICATION OF WOUND VAC;  Surgeon: [name], DPM;  Location:  [place];  Service: Podiatry;  Laterality: Right;  . APPLICATION OF WOUND VAC  12/06/2017   Procedure: APPLICATION OF WOUND VAC;  Surgeon: [name], MD;  Location: [place];  Service: Orthopedics;;  . AV FISTULA PLACEMENT Right 10/19/2012  . AV FISTULA REPAIR Right ~ 02/2018   "had it cleaned out"  . BELOW KNEE LEG AMPUTATION Left ~ 2016  . COLONOSCOPY WITH PROPOFOL N/A 07/30/2017   Procedure: COLONOSCOPY WITH PROPOFOL;  Surgeon: [name], MD;  Location: [place];  Service: Endoscopy;  Laterality: N/A;  . ESOPHAGOGASTRODUODENOSCOPY (EGD) WITH PROPOFOL N/A 07/28/2017   Procedure: ESOPHAGOGASTRODUODENOSCOPY (EGD) WITH PROPOFOL;  Surgeon: [name], MD;  Location: [place];  Service: Endoscopy;  Laterality: N/A;  . ESOPHAGOGASTRODUODENOSCOPY (EGD) WITH PROPOFOL N/A 01/15/2018   Procedure: ESOPHAGOGASTRODUODENOSCOPY (EGD) WITH PROPOFOL;  Surgeon: [name], MD;  Location[facility] ENDOSCOPY;  Service: Endoscopy;  Laterality: N/A;  . FLEXIBLE SIGMOIDOSCOPY N/A 07/27/2017   Procedure: FLEXIBLE SIGMOIDOSCOPY;  Surgeon: [name], MD;  [facility];  Service: Endoscopy;  Laterality: N/A;  . GRAFT APPLICATION Right 63/[id number]/5643   Procedure: SKIN GRAFT APPLICATION RIGHT FOOT;  Surgeon: [name], DPM;  Location: [place];  Service: Podiatry;  Laterality: Right;  . I&D EXTREMITY Right 09/04/2017   Procedure: IRRIGATION AND DEBRIDEMENT EXTREMITY;  Surgeon: [name], DPM;  Location: [place];  Service: Podiatry;  Laterality: Right;  . I&D EXTREMITY Right 11/12/2017   Procedure: IRRIGATION AND DEBRIDEMENT ULCER RIGHT FOOT;  Surgeon: [name], DPM;  Location: [place];  Service: Podiatry;  Laterality: Right;  . LEFT HEART CATH AND CORONARY ANGIOGRAPHY N/A 06/18/2018   Procedure: LEFT HEART CATH AND CORONARY ANGIOGRAPHY;  Surgeon: [name], Peter M, MD;  Location: [place] CV LAB;  Service: Cardiovascular;  Laterality: N/A;  . WOUND DEBRIDEMENT N/A 09/24/2017    Procedure: DEBRIDEMENT WOUND;  Surgeon: [name], DPM;  Location: [place];  Service: Podiatry;  Laterality: N/A;    Current Medications: Current Meds  Medication Sig  . amiodarone (PACERONE) 200 MG tablet Take 1 tablet (200 mg total) by mouth daily.  . bictegravir-emtricitabine-tenofovir AF (BIKTARVY) 50-200-25 MG TABS tablet Take 1 tablet by mouth daily.  . camphor-menthol (SARNA) lotion Apply topically as needed for itching.  . cinacalcet (SENSIPAR) 90 MG tablet Take 90 mg by mouth daily.  . ferric gluconate 125 mg in sodium chloride 0.9 % 100 mL Inject 125 mg into the vein Every Tuesday,Thursday,and Saturday with dialysis.  Marland Kitchen gabapentin (NEURONTIN) 100 MG capsule Take 1 capsule (100 mg total) by mouth every dialysis.  Marland Kitchen insulin glargine (LANTUS) 100 unit/mL SOPN Inject 0.3 mLs (30 Units total) into the skin at bedtime. (Patient taking differently: Inject 20 Units into the skin at bedtime. )  .  insulin lispro (HUMALOG) 100 UNIT/ML KiwkPen Please inject 10 units, twice daily, ~10 min before your two largest meals of the day.  . linagliptin (TRADJENTA) 5 MG TABS tablet Take 1 tablet (5 mg total) by mouth daily.  . metoprolol succinate (TOPROL-XL) 100 MG 24 hr tablet TAKE 1 TABLET BY MOUTH ONCE DAILY TAKE WITH OR IMMEDIATELY FOLLOWING A MEAL  . pantoprazole (PROTONIX) 40 MG tablet Take 2 tablets (80 mg total) by mouth daily.  . polycarbophil (FIBERCON) 625 MG tablet Take 1 tablet (625 mg total) by mouth daily.  . rosuvastatin (CRESTOR) 20 MG tablet TAKE 1 TABLET BY MOUTH ONCE DAILY AT 6PM  . warfarin (COUMADIN) 7.5 MG tablet TAKE 1/2 TABLET BY MOUTH DAILY, EXCEPT 1 TABLET ON Monday, Wednesday, AND Saturday OR AS DIRECTED BY ANTICOAGULATION CLINIC.     Allergies:   Oxycodone-acetaminophen   Social History   Socioeconomic History  . Marital status: Married    Spouse name: kim  . Number of children: 3  . Years of education: college  . Highest education level: Not on file    Occupational History  . Occupation: disabled  Social Needs  . Financial resource strain: Not on file  . Food insecurity:    Worry: Not on file    Inability: Not on file  . Transportation needs:    Medical: Not on file    Non-medical: Not on file  Tobacco Use  . Smoking status: Never Smoker  . Smokeless tobacco: Never Used  Substance and Sexual Activity  . Alcohol use: Never    Frequency: Never  . Drug use: Never  . Sexual activity: Not Currently  Lifestyle  . Physical activity:    Days per week: Not on file    Minutes per session: Not on file  . Stress: Not on file  Relationships  . Social connections:    Talks on phone: Not on file    Gets together: Not on file    Attends religious service: Not on file    Active member of club or organization: Not on file    Attends meetings of clubs or organizations: Not on file    Relationship status: Not on file  Other Topics Concern  . Not on file  Social History Narrative   Lives in [place] with wife.     Family History: The patient's family history includes Diabetes in his mother; Heart disease in his father and mother. There is no history of Cancer.  ROS:   Please see the history of present illness.    ROS  All other systems reviewed and negative.   EKGs/Labs/Other Studies Reviewed:    The following studies were reviewed today: Hospital notes from 05/2018 and 09/2018 as well as cardiac cath  EKG:  EKG is ordered today showed atypical atrial flutter with 2-1 block at 104 bpm  Recent Labs: 06/17/2018: Magnesium 2.2 08/05/2018: ALT 21; TSH 1.600 01/22/2019: BUN 30; Creatinine, Ser 8.95; Hemoglobin 11.0; Platelets 197; Potassium 4.3; Sodium 134   Recent Lipid Panel    Component Value Date/Time   CHOL 202 (H) 06/18/2018 0338   TRIG 596 (H) 06/18/2018 0338   HDL 20 (L) 06/18/2018 0338   CHOLHDL 10.1 06/18/2018 0338   VLDL UNABLE TO CALCULATE IF TRIGLYCERIDE OVER 400 mg/dL 06/18/2018 0338   LDLCALC UNABLE TO CALCULATE  IF TRIGLYCERIDE OVER 400 mg/dL 06/18/2018 0338    Physical Exam:    VS:  BP (!) 146/82   Pulse (!) 112   Ht 6'  5" (1.956 m)   Wt 286 lb (129.7 kg)   SpO2 94%   BMI 33.91 kg/m     Wt Readings from Last 3 Encounters:  01/22/19 286 lb (129.7 kg)  12/07/18 297 lb 1.6 oz (134.8 kg)  10/26/18 293 lb 4.8 oz (133 kg)     GEN:  Well nourished, well developed in no acute distress HEENT: Normal NECK: No JVD; No carotid bruits LYMPHATICS: No lymphadenopathy CARDIAC: RRR, no murmurs, rubs, gallops RESPIRATORY:  Clear to auscultation without rales, wheezing or rhonchi  ABDOMEN: Soft, non-tender, non-distended MUSCULOSKELETAL:  No edema; No deformity  SKIN: Warm and dry NEUROLOGIC:  Alert and oriented x 3 PSYCHIATRIC:  Normal affect   ASSESSMENT:    1. Unstable angina pectoris ([place])   2. Coronary artery disease involving native coronary artery of native heart with angina pectoris ([place])   3. PAF (paroxysmal atrial fibrillation) ([place])   4. Essential hypertension   5. Non-ischemic cardiomyopathy ([place])   6. Chronic systolic heart failure ([place])   7. Ascending aortic aneurysm ([place])   8. Hyperlipidemia LDL goal <70    PLAN:    In order of problems listed above:  1.  Chest pain - he is actually been doing well up until today in the office when he had sudden onset of severe chest pain midsternal with radiation to the left chest.  EKG was done which showed some mild ST depression in lateral precordial leads but otherwise no evidence of STEMI.  He has known CAD that is quite severe but in distal territories except for the mid LAD lesion.  It was recommended on cath in the past that PCI not be pursued unless the patient had recurrent chest pain and then possible PCI atherectomy of the mid LAD could be pursued.  He is quite uncomfortable in the office today with his chest discomfort and feels very bad. -Will plan to admit him to the hospital telemetry unit.   -start him on IV nitro drip as blood  pressure tolerates -Hold Coumadin and start IV heparin drip once INR less than 2 -Cycle troponin every 6 hours x3 -Check 2D echocardiogram to rule out pericardial effusion and assess wall motion -Consider repeat cardiac cath with possible PCI of the LAD since this is now his second admission for chest pain  -Start aspirin 81 mg daily and continue statin.  2.  ASCAD - cardiac cath 05/2018  revealed 30% proximal LAD and 80% mid LAD, 95% ostial D3, 90% distal LAD, occluded D1, sat 95% acute marginal, occluded first RPLB, 95% posterior atrial branch and mild to moderate LV dysfunction EF 40 to 45%.  His LVEDP was normal at that time.  Medical management was recommended because other than the mid LAD and first diagonal all other disease was in smaller distal branches and the first diagonal was occluded.  It was felt that the LAD lesion should not be treated unless he had refractory angina on optimal medical therapy. -Continue Toprol-XL 100 mg daily -Aspirin 81 mg daily -Continue statin increase Crestor to 40 mg daily   2.  PAF -he suddenly started feeling poorly in the office and said he thought he might of gone back into A. fib.  EKG shows atypical atrial flutter with RVR and 2-1 block at 105 bpm.  Unclear if this is actually the etiology of his current symptoms of chest pain and shortness of breath since his heart rates fairly well controlled but given his LV dysfunction the conversion to atrial  flutter and loss of atrial kick may be causing some of his symptoms.  He will continue on amiodarone 200 mg daily, Toprol-XL 100 mg daily and Coumadin.  Is not on a DOAC due to end-stage renal disease on hemodialysis.  His INR has been greater than 2.5 since December 19th so could do cardioversion while hospitalized after ischemic work-up completed.  3.  Hypertension -BP is well controlled on exam today.   -He will continue on Toprol-XL 100 mg daily.  4.  Dilated cardiomyopathy -repeat 2D echocardiogram  10/16/2018 showed improvement in LV function.  He has severe LVH and an EF of 35 to 40% with inferolateral wall hypokinesis.  5.  Chronic systolic heart failure -he appears euvolemic on exam today.  Weight is stable and actually down 9 pounds from December.   -He will continue on Toprol-XL 100 mg daily.   -He is not on an ACEI/ARB/ARNI due to end-stage renal disease and hypotension during dialysis.  - His volume is managed by dialysis. -Would benefit from the addition of hydralazine/Imdur but not sure blood pressure will tolerate on hemodialysis days  6.  Ascending aortic aneurysm -2D echo showed a sending aortic aneurysm at 43 mmHg.   -A 2D echocardiogram is pending 09/2019 to make sure this is stable.   -Continue statin therapy.  7.  Hyperlipidemia with LDL goal less than 70  - his LDL was 62 on 03/10/2017 but then could not be calculated on repeat 06/18/2018 where his triglycerides were 596 and LDL could not be calculated..  - I will recheck an FLP and ALT in a.m. - -increase Crestor to 40 mg daily.   Medication Adjustments/Labs and Tests Ordered: Current medicines are reviewed at length with the patient today.  Concerns regarding medicines are outlined above.  Orders Placed This Encounter  Procedures  . Pulmonary function test   No orders of the defined types were placed in this encounter.   Signed, Fransico Him, MD  01/22/2019 5:15 PM    [place]

## 2019-01-22 NOTE — ED Notes (Signed)
Pt received 2mg  morphine iv and became sleepy, stopped giving medicine, pt is comfortable.

## 2019-01-22 NOTE — Consult Note (Addendum)
Cardiology Office Note:   Date: 01/22/2019  ID: Thomas [name], DOB 02/04/1966, MRN [id number]  PCP: [name], MD  Cardiologist: Fransico Him, MD  Referring MD: [name], MD      Chief Complaint  Patient presents with  . Follow-up    CAD, CHF   History of Present Illness:    Thomas [name] is a 53 y.o. male with a hx of S-D-CHF, Aflutter on amio and coumadin, CAD w/ med mgt, ESRD on HD, DM, HTN, HLD w/ elev trig, bilat BKA, HIV, PAD.   The patient was hospitalized in June 2019 and underwent cardiac cath that revealed 30% proximal LAD and 80% mid LAD, 95% ostial D3, 90% distal LAD, occluded D1, sat 95% acute marginal, occluded first RPL B, 95% posterior atrial branch and mild to moderate LV dysfunction EF 40 to 45%. His LVEDP was normal at that time. Medical management was recommended because other than the mid LAD and first diagonal all other disease was in smaller distal branches and the first diagonal was occluded. It was felt that the LAD lesion should not be treated unless he had refractory angina on optimal medical therapy.   He was readmitted in October 2019 with complaints of chest pain. This awakened him from sleep. He went to dialysis later that day where his pressure dropped and he developed chest pain again. There was a minimal bump in his troponin which was felt consistent with his end-stage renal disease with some demand ischemia related to stress of dialysis and hypotension. Cath was recommended but he refused. Stress testing was not felt to add any further information given that we already knew his coronary anatomy.   He is here today for followup. He tells me initially when he got to the office he was feeling okay but while sitting in the examining room he started developing chest pressure in the middle of his chest radiating to the left side. Says all of a sudden he did not feel good. He looks very uncomfortable sitting in the chair. He is concerned that he may have  gone into atrial fibrillation. He tells me that since his hospital admission in October he is actually done quite well. This is the first time his had chest pain since then. He is also not noticed any palpitations since then either. He has been doing fairly well in dialysis as well without any recurrent chest pain. He feels short of breath today but has not noticed any lower extremity edema.       Past Medical History:  Diagnosis Date  . Anemia   . Atrial fibrillation ([place])   . CAD (coronary artery disease), native coronary artery 01/22/2019  . Chest pain with moderate risk for cardiac etiology 06/15/2018  . Chronic combined systolic and diastolic CHF (congestive heart failure) ([place])   . Diabetic foot ulcer ([place]) 09/03/2017  . Diabetic wet gangrene of the foot ([place]) 09/04/2017  . ESRD (end stage renal disease) on dialysis [facility])    [place] [address], [place] (10/15/2018)  . Headache    "monthly" (06/15/2018)  . HIV disease ([place])   . Hyperlipidemia LDL goal <70 01/22/2019  . Hyperparathyroidism, secondary ([place])   . Hypertension   . Hypertensive heart disease with end stage renal disease on dialysis ([place]) 03/13/2017  . Mallory-Weiss tear 2018  . Pneumonia X 1  . Subacute osteomyelitis, right ankle and foot ([place])   . SVT (supraventricular tachycardia) ([place])    ? afib or atrial  flutter s/p TEE/DCCV with subsequent ablation due to reoccurrence in [place]  . SVT (supraventricular tachycardia) ([place])    ? afib or atrial flutter s/p TEE/DCCV with subsequent ablation due to reoccurrence in [place]  . Type 2 diabetes mellitus ([place])         Past Surgical History:  Procedure Laterality Date  . AMPUTATION Left    foot  . AMPUTATION Right 12/06/2017   Procedure: AMPUTATION BELOW KNEE; Surgeon: [name], MD; Location: [place]; Service: Orthopedics; Laterality: Right;  . APPLICATION OF WOUND VAC Right 09/04/2017   Procedure: APPLICATION OF WOUND VAC; Surgeon: [name], DPM; Location: [place]; Service:  Podiatry; Laterality: Right;  . APPLICATION OF WOUND VAC  12/06/2017   Procedure: APPLICATION OF WOUND VAC; Surgeon: [name], MD; Location: [place]; Service: Orthopedics;;  . AV FISTULA PLACEMENT Right 10/19/2012  . AV FISTULA REPAIR Right ~ 02/2018   "had it cleaned out"  . BELOW KNEE LEG AMPUTATION Left ~ 2016  . COLONOSCOPY WITH PROPOFOL N/A 07/30/2017   Procedure: COLONOSCOPY WITH PROPOFOL; Surgeon: [name], MD; Location: [place]; Service: Endoscopy; Laterality: N/A;  . ESOPHAGOGASTRODUODENOSCOPY (EGD) WITH PROPOFOL N/A 07/28/2017   Procedure: ESOPHAGOGASTRODUODENOSCOPY (EGD) WITH PROPOFOL; Surgeon: [name], MD; Location: [place]; Service: Endoscopy; Laterality: N/A;  . ESOPHAGOGASTRODUODENOSCOPY (EGD) WITH PROPOFOL N/A 01/15/2018   Procedure: ESOPHAGOGASTRODUODENOSCOPY (EGD) WITH PROPOFOL; Surgeon: [name], MD; Location[facility] ENDOSCOPY; Service: Endoscopy; Laterality: N/A;  . FLEXIBLE SIGMOIDOSCOPY N/A 07/27/2017   Procedure: FLEXIBLE SIGMOIDOSCOPY; Surgeon: [name], MD; Location[facility] ENDOSCOPY; Service: Endoscopy; Laterality: N/A;  . GRAFT APPLICATION Right [id number]/84/6962   Procedure: SKIN GRAFT APPLICATION RIGHT FOOT; Surgeon: [name], DPM; Location: [place]; Service: Podiatry; Laterality: Right;  . I&D EXTREMITY Right 09/04/2017   Procedure: IRRIGATION AND DEBRIDEMENT EXTREMITY; Surgeon: [name], DPM; Location: [place]; Service: Podiatry; Laterality: Right;  . I&D EXTREMITY Right 11/12/2017   Procedure: IRRIGATION AND DEBRIDEMENT ULCER RIGHT FOOT; Surgeon: [name], DPM; Location: [place]; Service: Podiatry; Laterality: Right;  . LEFT HEART CATH AND CORONARY ANGIOGRAPHY N/A 06/18/2018   Procedure: LEFT HEART CATH AND CORONARY ANGIOGRAPHY; Surgeon: [name], Peter M, MD; Location: [place] CV LAB; Service: Cardiovascular; Laterality: N/A;  . WOUND DEBRIDEMENT N/A 09/24/2017   Procedure: DEBRIDEMENT WOUND; Surgeon: [name], DPM;  Location: [place]; Service: Podiatry; Laterality: N/A;   Current Medications:  Active Medications   Allergies: Oxycodone-acetaminophen  Social History        Socioeconomic History  . Marital status: Married    Spouse name: kim  . Number of children: 3  . Years of education: college  . Highest education level: Not on file  Occupational History  . Occupation: disabled  Social Needs  . Financial resource strain: Not on file  . Food insecurity:    Worry: Not on file    Inability: Not on file  . Transportation needs:    Medical: Not on file    Non-medical: Not on file  Tobacco Use  . Smoking status: Never Smoker  . Smokeless tobacco: Never Used  Substance and Sexual Activity  . Alcohol use: Never    Frequency: Never  . Drug use: Never  . Sexual activity: Not Currently  Lifestyle  . Physical activity:    Days per week: Not on file    Minutes per session: Not on file  . Stress: Not on file  Relationships  . Social connections:    Talks on phone: Not on file  Gets together: Not on file    Attends religious service: Not on file    Active member of club or organization: Not on file    Attends meetings of clubs or organizations: Not on file    Relationship status: Not on file  Other Topics Concern  . Not on file  Social History Narrative   Lives in [place] with wife.   Family History:  The patient's family history includes Diabetes in his mother; Heart disease in his father and mother. There is no history of Cancer.   ROS:  Please see the history of present illness.    EKGs/Labs/Other Studies Reviewed:   The following studies were reviewed today:  Hospital notes from 05/2018 and 09/2018 as well as cardiac cath   EKG: EKG is ordered today showed atypical atrial flutter with 2-1 block at 104 bpm   Recent Labs:  06/17/2018: Magnesium 2.2  08/05/2018: ALT 21; TSH 1.600  01/22/2019: BUN 30; Creatinine, Ser 8.95; Hemoglobin 11.0; Platelets 197; Potassium 4.3; Sodium  134    Physical Exam:   VS: BP (!) 146/82  Pulse (!) 112  Ht 6\' 5"  (1.956 m)  Wt 286 lb (129.7 kg)  SpO2 94%  BMI 33.91 kg/m     Wt Readings from Last 3 Encounters:  01/22/19 286 lb (129.7 kg)  12/07/18 297 lb 1.6 oz (134.8 kg)  10/26/18 293 lb 4.8 oz (133 kg)   GEN: Well nourished, well developed in no acute distress  HEENT: Normal  NECK: No JVD; No carotid bruits  LYMPHATICS: No lymphadenopathy  CARDIAC: RRR, no murmurs, rubs, gallops  RESPIRATORY: Clear to auscultation without rales, wheezing or rhonchi  ABDOMEN: Soft, non-tender, non-distended  MUSCULOSKELETAL: No edema; No deformity  SKIN: Warm and dry  NEUROLOGIC: Alert and oriented x 3  PSYCHIATRIC: Normal affect   ASSESSMENT:    1. Unstable angina pectoris ([place])   2. Coronary artery disease involving native coronary artery of native heart with angina pectoris ([place])   3. PAF (paroxysmal atrial fibrillation) ([place])   4. Essential hypertension   5. Non-ischemic cardiomyopathy ([place])   6. Chronic systolic heart failure ([place])   7. Ascending aortic aneurysm ([place])   8. Hyperlipidemia LDL goal <70    PLAN:   In order of problems listed above:   1. Chest pain - he is actually been doing well up until today in the office when he had sudden onset of severe chest pain midsternal with radiation to the left chest. EKG was done which showed some mild ST depression in lateral precordial leads but otherwise no evidence of STEMI. He has known CAD that is quite severe but in distal territories except for the mid LAD lesion. It was recommended on cath in the past that PCI not be pursued unless the patient had recurrent chest pain and then possible PCI atherectomy of the mid LAD could be pursued. He is quite uncomfortable in the office today with his chest discomfort and feels very bad.  -Will plan to admit him to the hospitalist service on telemetry unit given his other significant medical problems -start him on IV nitro drip as blood  pressure tolerates  -Hold Coumadin and start IV heparin drip once INR less than 2  -Cycle troponin every 6 hours x3  -Check 2D echocardiogram to rule out pericardial effusion and assess wall motion  -Consider repeat cardiac cath with possible PCI of the LAD since this is now his second admission for chest pain  -Start aspirin 81  mg daily and continue statin.   2. ASCAD - cardiac cath 05/2018 revealed 30% proximal LAD and 80% mid LAD, 95% ostial D3, 90% distal LAD, occluded D1, sat 95% acute marginal, occluded first RPLB, 95% posterior atrial branch and mild to moderate LV dysfunction EF 40 to 45%. His LVEDP was normal at that time. Medical management was recommended because other than the mid LAD and first diagonal all other disease was in smaller distal branches and the first diagonal was occluded. It was felt that the LAD lesion should not be treated unless he had refractory angina on optimal medical therapy.  -Continue Toprol-XL 100 mg daily  -Aspirin 81 mg daily  -Continue statin increase Crestor to 40 mg daily   3. PAF -he suddenly started feeling poorly in the office and said he thought he might of gone back into A. fib. EKG shows atypical atrial flutter with RVR and 2-1 block at 105 bpm. Unclear if this is actually the etiology of his current symptoms of chest pain and shortness of breath since his heart rates fairly well controlled but given his LV dysfunction the conversion to atrial flutter and loss of atrial kick may be causing some of his symptoms. He will continue on amiodarone 200 mg daily, Toprol-XL 100 mg daily and Coumadin. Is not on a DOAC due to end-stage renal disease on hemodialysis. His INR has been greater than 2.5 since December 19th so could do cardioversion while hospitalized after ischemic work-up completed.   4. Hypertension -BP is well controlled on exam today.  -He will continue on Toprol-XL 100 mg daily.   5. Dilated cardiomyopathy -repeat 2D echocardiogram 10/16/2018  showed improvement in LV function. He has severe LVH and an EF of 35 to 40% with inferolateral wall hypokinesis.   6. Chronic systolic heart failure -he appears euvolemic on exam today. Weight is stable and actually down 9 pounds from December.  -He will continue on Toprol-XL 100 mg daily.  -He is not on an ACEI/ARB/ARNI due to end-stage renal disease and hypotension during dialysis.  - His volume is managed by dialysis.  -Would benefit from the addition of hydralazine/Imdur but not sure blood pressure will tolerate on hemodialysis days   7. Ascending aortic aneurysm -2D echo showed a sending aortic aneurysm at 43 mmHg.  -A 2D echocardiogram is pending 09/2019 to make sure this is stable.  -Continue statin therapy.   8. Hyperlipidemia with LDL goal less than 70  - his LDL was 62 on 03/10/2017 but then could not be calculated on repeat 06/18/2018 where his triglycerides were 596 and LDL could not be calculated..  - I will recheck an FLP and ALT in a.m.  -increase Crestor to 40 mg daily.   Signed,  Fransico Him, MD  01/22/2019 5:15 PM  [place]

## 2019-01-22 NOTE — ED Provider Notes (Addendum)
[place] [facility] EMERGENCY DEPARTMENT Provider Note   CSN: [id number] Arrival date & time: 01/22/19  1453     History   Chief Complaint Chief Complaint  Patient presents with  . Chest Pain    HPI Quindon Denker is a 53 y.o. male.  The history is provided by the patient and medical records. No language interpreter was used.  Chest Pain     53 year old male with history of CAD, end-stage renal disease currently on Tuesday Thursday Saturday dialysis, atrial fibrillation on warfarin, HIV, diabetes, CHF presents for evaluation of chest pain.  Patient states he was at the cardiology office today for regular checkup when he report feeling his heart palpitated, also experiencing a pressure sensation in his chest, felt a bit lightheaded and feeling nauseous.  Does not complain of any fever chills no productive cough, no pleuritic chest pain.  Pain is mild to moderate.  He also endorsed having intermittent right-sided abdominal pain that happen sporadically within the past 2 to 3 days.  He discussed this finding with the cardiologist who recommend patient to come to ER for further evaluation of his chest discomfort.  He had his dialysis session yesterday for the full duration.  He denies any postprandial pain.  Patient states he has been compliant with his medications.  Past Medical History:  Diagnosis Date  . Anemia   . Atrial fibrillation ([place])   . Chest pain with moderate risk for cardiac etiology 06/15/2018  . Chronic combined systolic and diastolic CHF (congestive heart failure) ([place])   . Diabetic foot ulcer ([place]) 09/03/2017  . Diabetic wet gangrene of the foot ([place]) 09/04/2017  . ESRD (end stage renal disease) on dialysis [facility])    [place] [address], [place], [place] (10/15/2018)  . Headache    "monthly" (06/15/2018)  . HIV disease ([place])   . Hyperlipidemia LDL goal <70 01/22/2019  . Hyperparathyroidism, secondary ([place])   . Hypertension   . Hypertensive heart disease with end stage  renal disease on dialysis ([place]) 03/13/2017  . Mallory-Weiss tear 2018  . Pneumonia X 1  . Subacute osteomyelitis, right ankle and foot ([place])   . SVT (supraventricular tachycardia) ([place])    ? afib or atrial flutter s/p TEE/DCCV with subsequent ablation due to reoccurrence in [place]  . SVT (supraventricular tachycardia) ([place])    ? afib or atrial flutter s/p TEE/DCCV with subsequent ablation due to reoccurrence in [place]  . Type 2 diabetes mellitus [facility])     Patient Active Problem List   Diagnosis Date Noted  . Hyperlipidemia LDL goal <70 01/22/2019  . Ascending aortic aneurysm ([place]) 12/07/2018  . Noninfectious gastroenteritis 10/27/2018  . 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 Chest pain 10/15/2018  . Non-ST elevation (NSTEMI) myocardial infarction ([place])   . Chest pain with moderate risk for cardiac etiology 06/15/2018  . Atrial flutter ([place]) 06/15/2018  . Somniloquy 06/12/2018  . Encounter for therapeutic drug monitoring 05/27/2018  . Chronic systolic heart failure ([place])   . Murmur, cardiac 02/13/2018  . Mallory-Weiss tear 01/16/2018  . Morbid obesity ([place])   . Fall   . Subtherapeutic international normalized ratio (INR)   . History of left below knee amputation ([place]) 12/11/2017  . History of supraventricular tachycardia   . S/P bilateral BKA (below knee amputation) ([place])   . Anemia of chronic disease   . Leukocytosis   . Non-ischemic cardiomyopathy ([place])   . Hypokalemia 09/04/2017  . Essential hypertension 09/03/2017  . GERD (gastroesophageal reflux disease) 09/03/2017  .  GIB (gastrointestinal bleeding) 07/26/2017  . Anemia due to end stage renal disease ([place]) 07/26/2017  . PAF (paroxysmal atrial fibrillation) ([place]) 07/26/2017  . Symptomatic anemia 07/26/2017  . End stage renal disease ([place]) 05/05/2017  . HIV disease ([place]) 03/25/2017  . Hypertensive heart disease with end stage renal disease on dialysis ([place]) 03/13/2017  . ESRD on dialysis ([place]) 03/13/2017  . Type 2 diabetes  mellitus with complication ([place]) 62/83/1517    Past Surgical History:  Procedure Laterality Date  . AMPUTATION Left    foot  . AMPUTATION Right 12/06/2017   Procedure: AMPUTATION BELOW KNEE;  Surgeon: [name], MD;  Location: [place];  Service: Orthopedics;  Laterality: Right;  . APPLICATION OF WOUND VAC Right 09/04/2017   Procedure: APPLICATION OF WOUND VAC;  Surgeon: [name], DPM;  Location: [place];  Service: Podiatry;  Laterality: Right;  . APPLICATION OF WOUND VAC  12/06/2017   Procedure: APPLICATION OF WOUND VAC;  Surgeon: [name], MD;  Location: [place];  Service: Orthopedics;;  . AV FISTULA PLACEMENT Right 10/19/2012  . AV FISTULA REPAIR Right ~ 02/2018   "had it cleaned out"  . BELOW KNEE LEG AMPUTATION Left ~ 2016  . COLONOSCOPY WITH PROPOFOL N/A 07/30/2017   Procedure: COLONOSCOPY WITH PROPOFOL;  Surgeon: [name], MD;  Location: [facility];  Service: Endoscopy;  Laterality: N/A;  . ESOPHAGOGASTRODUODENOSCOPY (EGD) WITH PROPOFOL N/A 07/28/2017   Procedure: ESOPHAGOGASTRODUODENOSCOPY (EGD) WITH PROPOFOL;  Surgeon: [name], MD;  Location: [place];  Service: Endoscopy;  Laterality: N/A;  . ESOPHAGOGASTRODUODENOSCOPY (EGD) WITH PROPOFOL N/A 01/15/2018   Procedure: ESOPHAGOGASTRODUODENOSCOPY (EGD) WITH PROPOFOL;  Surgeon: [name], MD;  Location: [facility] ENDOSCOPY;  Service: Endoscopy;  Laterality: N/A;  . FLEXIBLE SIGMOIDOSCOPY N/A 07/27/2017   Procedure: FLEXIBLE SIGMOIDOSCOPY;  Surgeon: [name], MD;  Location: [facility] ENDOSCOPY;  Service: Endoscopy;  Laterality: N/A;  . GRAFT APPLICATION Right 61/60/7371   Procedure: SKIN GRAFT APPLICATION RIGHT FOOT;  Surgeon: [name], DPM;  Location: [place];  Service: Podiatry;  Laterality: Right;  . I&D EXTREMITY Right 09/04/2017   Procedure: IRRIGATION AND DEBRIDEMENT EXTREMITY;  Surgeon: [name], DPM;  Location: [place];  Service: Podiatry;  Laterality: Right;  . I&D EXTREMITY Right 11/12/2017    Procedure: IRRIGATION AND DEBRIDEMENT ULCER RIGHT FOOT;  Surgeon: [name], DPM;  Location: [place];  Service: Podiatry;  Laterality: Right;  . LEFT HEART CATH AND CORONARY ANGIOGRAPHY N/A 06/18/2018   Procedure: LEFT HEART CATH AND CORONARY ANGIOGRAPHY;  Surgeon: [name], Peter M, MD;  Location: [place] CV LAB;  Service: Cardiovascular;  Laterality: N/A;  . WOUND DEBRIDEMENT N/A 09/24/2017   Procedure: DEBRIDEMENT WOUND;  Surgeon: [name], DPM;  Location: [facility];  Service: Podiatry;  Laterality: N/A;        Home Medications    Prior to Admission medications   Medication Sig Start Date End Date Taking? Authorizing Provider  amiodarone (PACERONE) 200 MG tablet Take 1 tablet (200 mg total) by mouth daily. 08/05/18   Lyda Jester M, PA-C  bictegravir-emtricitabine-tenofovir AF (BIKTARVY) 50-200-25 MG TABS tablet Take 1 tablet by mouth daily. 03/02/18   Carlyle Basques, MD  camphor-menthol [facility]) lotion Apply topically as needed for itching. 12/11/17   Colbert Ewing, MD  cinacalcet (SENSIPAR) 90 MG tablet Take 90 mg by mouth daily. 04/27/18   [provider]  ferric gluconate 125 mg in sodium chloride 0.9 % 100 mL Inject 125 mg into the vein Every  Tuesday,Thursday,and Saturday with dialysis. 05/10/17   Hosie Poisson, MD  gabapentin (NEURONTIN) 100 MG capsule Take 1 capsule (100 mg total) by mouth every dialysis. 01/09/18   Jamse Arn, MD  insulin glargine (LANTUS) 100 unit/mL SOPN Inject 0.3 mLs (30 Units total) into the skin at bedtime. Patient taking differently: Inject 20 Units into the skin at bedtime.  12/19/17   Angiulli, Lavon Paganini, PA-C  insulin lispro (HUMALOG) 100 UNIT/ML KiwkPen Please inject 10 units, twice daily, ~10 min before your two largest meals of the day. 10/29/18   Kathi Ludwig, MD  linagliptin (TRADJENTA) 5 MG TABS tablet Take 1 tablet (5 mg total) by mouth daily. 12/07/18   [name], MD  metoprolol succinate (TOPROL-XL) 100 MG 24  hr tablet TAKE 1 TABLET BY MOUTH ONCE DAILY TAKE WITH OR IMMEDIATELY FOLLOWING A MEAL 08/05/18   Rosita Fire, Brittainy M, PA-C  pantoprazole (PROTONIX) 40 MG tablet Take 2 tablets (80 mg total) by mouth daily. 02/16/18   Alphonzo Grieve, MD  polycarbophil (FIBERCON) 625 MG tablet Take 1 tablet (625 mg total) by mouth daily. 12/19/17   Angiulli, Lavon Paganini, PA-C  rosuvastatin (CRESTOR) 20 MG tablet TAKE 1 TABLET BY MOUTH ONCE DAILY AT [facility] 08/19/18   [name], MD  warfarin (COUMADIN) 7.5 MG tablet TAKE 1/2 TABLET BY MOUTH DAILY, EXCEPT 1 TABLET ON Monday, Wednesday, AND Saturday OR AS DIRECTED BY ANTICOAGULATION CLINIC. 12/25/18   Sueanne Margarita, MD    Family History Family History  Problem Relation Age of Onset  . Heart disease Mother   . Diabetes Mother   . Heart disease Father   . Cancer Neg Hx     Social History Social History   Tobacco Use  . Smoking status: Never Smoker  . Smokeless tobacco: Never Used  Substance Use Topics  . Alcohol use: Never    Frequency: Never  . Drug use: Never     Allergies   Oxycodone-acetaminophen   Review of Systems Review of Systems  Cardiovascular: Positive for chest pain.  All other systems reviewed and are negative.    Physical Exam Updated Vital Signs BP (!) 129/97 (BP Location: Left Arm)   Pulse (!) 102   Temp 98 F (36.7 C) (Oral)   Resp 16   SpO2 97%   Physical Exam Vitals signs and nursing note reviewed.  Constitutional:      General: He is not in acute distress.    Appearance: He is well-developed. He is obese.  HENT:     Head: Atraumatic.  Eyes:     Conjunctiva/sclera: Conjunctivae normal.  Neck:     Musculoskeletal: Neck supple.  Cardiovascular:     Rate and Rhythm: Tachycardia present.  Pulmonary:     Effort: Pulmonary effort is normal.     Breath sounds: Normal breath sounds.  Abdominal:     Palpations: Abdomen is soft.     Tenderness: There is no abdominal tenderness.  Skin:    Findings: No rash.    Neurological:     Mental Status: He is alert and oriented to person, place, and time.      ED Treatments / Results  Labs (all labs ordered are listed, but only abnormal results are displayed) Labs Reviewed  BASIC METABOLIC PANEL - Abnormal; Notable for the following components:      Result Value   Sodium 134 (*)    Chloride 95 (*)    Glucose, Bld 298 (*)    BUN 30 (*)  Creatinine, Ser 8.95 (*)    Calcium 8.6 (*)    GFR calc non Af Amer 6 (*)    GFR calc Af Amer 7 (*)    All other components within normal limits  CBC - Abnormal; Notable for the following components:   RBC 3.48 (*)    Hemoglobin 11.0 (*)    HCT 33.3 (*)    All other components within normal limits  PROTIME-INR - Abnormal; Notable for the following components:   Prothrombin Time 30.7 (*)    All other components within normal limits  I-STAT TROPONIN, ED - Abnormal; Notable for the following components:   Troponin i, poc 0.10 (*)    All other components within normal limits    EKG EKG Interpretation  Date/Time:  Friday January 22 2019 15:01:34 EST Ventricular Rate:  105 PR Interval:    QRS Duration: 102 QT Interval:  420 QTC Calculation: 555 R Axis:   106 Text Interpretation:  Long QTc Accelerated Junctional rhythm Rightward axis Prolonged QT Abnormal ECG New since previous tracing Confirmed by Fredia Sorrow [phone]) on 01/22/2019 5:04:12 PM   Radiology Dg Chest 2 View  Result Date: 01/22/2019 CLINICAL DATA:  Chest pain and shortness of breath EXAM: CHEST - 2 VIEW COMPARISON:  October 15, 2018 FINDINGS: Cardiomegaly. The hila and mediastinum are unremarkable. No pneumothorax. Haziness over the bases is symmetric and probably due to overlapping soft tissues. Subtle infiltrates not excluded. No other acute abnormalities. IMPRESSION: Haziness over the lung bases is symmetric and favored to be due to overlapping soft tissues. Subtle infiltrates considered less likely. Electronically Signed   By: Dorise Bullion III M.D   On: 01/22/2019 16:05    Procedures Procedures (including critical care time)  Medications Ordered in ED Medications  sodium chloride flush (NS) 0.9 % injection 3 mL (3 mLs Intravenous Not Given 01/22/19 1508)  morphine 4 MG/ML injection 4 mg (4 mg Intravenous Given 01/22/19 1721)  ondansetron (ZOFRAN) injection 4 mg (4 mg Intravenous Given 01/22/19 1721)     Initial Impression / Assessment and Plan / ED Course  I have reviewed the triage vital signs and the nursing notes.  Pertinent labs & imaging results that were available during my care of the patient were reviewed by me and considered in my medical decision making (see chart for details).     BP (!) 129/97 (BP Location: Left Arm)   Pulse (!) 102   Temp 98 F (36.7 C) (Oral)   Resp 16   SpO2 97%    Final Clinical Impressions(s) / ED Diagnoses   Final diagnoses:  Unstable angina [facility])    ED Discharge Orders    None     4:45 PM  Patient here complaining of heart palpitation and chest discomfort for the past 3 hours.  Symptoms started while he was at his cardiology office for a regular checkup.  He mention Dr. Golden Hurter recommend patient come to ER for further evaluation.  Currently he does endorse mild discomfort in his chest as well as discomfort to the right side of his abdomen.  He is otherwise well-appearing.  Initial troponin is 0.10 however this is at baseline for him and he has history of chronic renal disease which likely contributed to a false positive.  Chest x-ray shows haziness over lung base which is symmetric and favored to be due to overlapping soft tissue although subtle infiltrate considered less likely.  Patient does not have symptoms to suggest that he is having  pneumonia at this time.  5:55 PM Plan to consult cardiology to have patient admitted for unstable angina at the recommendations of Dr. [name].  Patient would likely be started on an IV nitro drip as blood pressure tolerates.   Coumadin needs to be hold and IV heparin drip started once INR is less than 2.  We will cycle troponin every 6 hours x 3, check 2D echocardiogram, and consider repeat cardiac cath with possible PCI of the LAD, start aspirin 81 mg daily.  These are the recommendation of Dr. [name].  6:43 PM Pt will be admitted to cardiology for further care.    7:31 PM Dr. [name] request medicine to admit pt since he is a dialysis pt with complicated medical hx.  Will consult medicine for admission.   8:11 PM I have consulted Dr. Hal Hope who agrees to see and admit pt for cp rule out.    8:41 PM I have consulted Internal Medicine resident who agrees to see and admit pt.     Domenic Moras, PA-C 01/22/19 2041    Fredia Sorrow, MD 01/24/19 [phone]

## 2019-01-22 NOTE — H&P (Signed)
Date: 01/22/2019               Patient Name:  Thomas [name] MRN: [id number]  DOB: Jul 27, 1966 Age / Sex: 53 y.o., male   PCP: [name], MD         Medical Service: Internal Medicine Teaching Service         Attending Physician: Dr. Sid Falcon, MD    First Contact: Dr. Annie Paras Pager: [phone]  Second Contact: Dr. Tarri Abernethy Pager: [phone]       After Hours (After 5p/  First Contact Pager: [phone]  weekends / holidays): Second Contact Pager: [phone]   Chief Complaint: chest pain  History of Present Illness:  53yo male with CAD, ESRD on HD TRS, a. Fib on warfarin and amio, HIV, DM2, HTN, HLD, CHF (EF 35-40%), bilateral BKA, PAD presenting with chest pain.   He was sitting in the waiting room at his cardiologist's office when he had sudden onset chest pressure in the middle of his chest ("like an elephant sitting on my chest") with associated lightheadedness and nausea and palpitations. He felt like he was going to pass out. His cardiologist came out to evaluate him and he looked unwell. EKG was ordered and EMS was called. He was given morphine with complete resolution of his symptoms. This is the first time he has had chest pain since October. He has otherwise been feeling well, but does note 2-3 days of preceding right upper quadrant abdominal pain, intermittent, sharp, making it hard for him to sleep, with associated orthopnea. Last HD was Thursday for full duration and he remains at his dry weight of 125kg.    Heart cath in June 2019 revealed severe multivessel disease but only the 80% mid LAD occlusion would be amenable to stenting so medical management was recommended at that time.   ED Course: Given morphine and zofran   Meds:  Current Meds  Medication Sig  . acetaminophen (TYLENOL) 325 MG tablet Take 650 mg by mouth every 6 (six) hours as needed (for headaches).   Marland Kitchen amiodarone (PACERONE) 200 MG tablet Take 1 tablet (200 mg total) by mouth daily. (Patient taking  differently: Take 200 mg by mouth at bedtime. )  . bictegravir-emtricitabine-tenofovir AF (BIKTARVY) 50-200-25 MG TABS tablet Take 1 tablet by mouth daily.  . camphor-menthol (SARNA) lotion Apply topically as needed for itching.  . cinacalcet (SENSIPAR) 90 MG tablet Take 90 mg by mouth daily.  . ferric citrate (AURYXIA) 1 GM 210 MG(Fe) tablet Take 420-840 mg by mouth See admin instructions. Take 840 mg by mouth three times a day after meals and 420 mg after each snack  . gabapentin (NEURONTIN) 100 MG capsule Take 1 capsule (100 mg total) by mouth every dialysis.  Marland Kitchen insulin glargine (LANTUS) 100 unit/mL SOPN Inject 0.3 mLs (30 Units total) into the skin at bedtime. (Patient taking differently: Inject 20 Units into the skin at bedtime. )  . insulin lispro (HUMALOG) 100 UNIT/ML KiwkPen Please inject 10 units, twice daily, ~10 min before your two largest meals of the day. (Patient taking differently: Inject 10 Units into the skin See admin instructions. Inject 10 units into the skin two times a day- 10 minutes before the 2 largest meals of the day)  . linagliptin (TRADJENTA) 5 MG TABS tablet Take 1 tablet (5 mg total) by mouth daily.  . metoprolol succinate (TOPROL-XL) 100 MG 24 hr tablet TAKE 1 TABLET BY MOUTH ONCE DAILY TAKE WITH OR IMMEDIATELY FOLLOWING A  MEAL (Patient taking differently: Take 100 mg by mouth daily. TAKE WITH OR IMMEDIATELY FOLLOWING A MEAL)  . Naproxen Sodium (ALEVE PO) Take 1-2 capsules by mouth every 8 (eight) hours as needed (for headaches or pain).  . pantoprazole (PROTONIX) 40 MG tablet Take 2 tablets (80 mg total) by mouth daily. (Patient taking differently: Take 40 mg by mouth 2 (two) times daily. )  . polycarbophil (FIBERCON) 625 MG tablet Take 1 tablet (625 mg total) by mouth daily.  . rosuvastatin (CRESTOR) 20 MG tablet TAKE 1 TABLET BY MOUTH ONCE DAILY AT 6PM (Patient taking differently: Take 20 mg by mouth daily. )  . warfarin (COUMADIN) 7.5 MG tablet TAKE 1/2 TABLET BY  MOUTH DAILY, EXCEPT 1 TABLET ON Monday, Wednesday, AND Saturday OR AS DIRECTED BY ANTICOAGULATION CLINIC. (Patient taking differently: Take 3.75-7.5 mg by mouth at bedtime. Take 3.75 mg by mouth at bedtime on Sun/Tues/Thurs/Fri and 7.5 mg on Mon/Wed/Sat)     Allergies: Allergies as of 01/22/2019 - Review Complete 01/22/2019  Allergen Reaction Noted  . Oxycodone-acetaminophen Other (See Comments) 01/13/2018   Past Medical History:  Diagnosis Date  . Anemia   . Atrial fibrillation ([place])   . CAD (coronary artery disease), native coronary artery 01/22/2019  . Chest pain with moderate risk for cardiac etiology 06/15/2018  . Chronic combined systolic and diastolic CHF (congestive heart failure) ([place])   . Diabetic foot ulcer ([place]) 09/03/2017  . Diabetic wet gangrene of the foot ([place]) 09/04/2017  . ESRD (end stage renal disease) on dialysis [facility])    [place] [address] T, [place], [place] (10/15/2018)  . Headache    "monthly" (06/15/2018)  . HIV disease ([place])   . Hyperlipidemia LDL goal <70 01/22/2019  . Hyperparathyroidism, secondary ([place])   . Hypertension   . Hypertensive heart disease with end stage renal disease on dialysis ([place]) 03/13/2017  . Mallory-Weiss tear 2018  . Pneumonia X 1  . Subacute osteomyelitis, right ankle and foot ([place])   . SVT (supraventricular tachycardia) ([place])    ? afib or atrial flutter s/p TEE/DCCV with subsequent ablation due to reoccurrence in [place]  . Type 2 diabetes mellitus (HCC)     Family History:  Family History  Problem Relation Age of Onset  . Heart disease Mother   . Diabetes Mother   . Heart disease Father   . Cancer Neg Hx     Social History: Never smoker. No alcohol or drug use.   Review of Systems: A complete ROS was negative except as per HPI.   Physical Exam: Blood pressure 125/89, pulse (!) 102, temperature 98 F (36.7 C), temperature source Oral, resp. rate (!) 21, SpO2 92 %. Vitals:   01/22/19 2015 01/22/19 2100 01/22/19 2115 01/22/19 2130  BP:  (!) 134/98 (!) 135/104 (!) 143/97 125/89  Pulse: (!) 103 (!) 102 (!) 103 (!) 102  Resp: 18 (!) 24 (!) 27 (!) 21  Temp:      TempSrc:      SpO2: 96% 96% 97% 92%   General: Vital signs reviewed.  Patient is well-developed and well-nourished, in no acute distress and cooperative with exam.  Head: Normocephalic and atraumatic. Eyes: EOMI, conjunctivae normal, no scleral icterus.  Neck: Supple, trachea midline, normal ROM, no JVD Cardiovascular: slightly tachycardic, regular rhythm, S1 normal, S2 normal, no murmurs, gallops, or rubs. Pulmonary/Chest: Clear to auscultation bilaterally, no wheezes, rales, or rhonchi. Abdominal: Soft, non-tender, non-distended, BS +, no masses, organomegaly, or guarding present.  Musculoskeletal: No joint deformities,  erythema, or stiffness, ROM full and nontender. Extremities: bilateral BKAs Neurological: A&O x3, Strength is normal and symmetric bilaterally, cranial nerve II-XII are grossly intact, no focal motor deficit, sensory intact to light touch bilaterally.  Skin: Warm, dry and intact. No rashes or erythema. Psychiatric: Normal mood and affect. speech and behavior is normal. Cognition and memory are normal.    Labs: Creatine 8.95 CBC with anemia of chronic disease, Hgb 11, MCV 95  Trop 0.10 --> 0.09  INR 3.0  EKG: personally reviewed my interpretation is HR 105, NSR, prolonged QTc 555  CXR: personally reviewed my interpretation is cardiomegaly with questionable bilateral infiltrates vs overlapping soft tissues  Assessment & Plan by Problem: Active Problems:   Unstable angina ([place])  53 y.o. male p/w s/s consistent with unstable angina and overall feeling poorly. Was seen by cardiologist outpatient who recommended admission to medicine service and possible repeat LHC  Unstable Angina:  - initial troponin and EKG reassuring, no evidence of STEMI - Echo Oct 2019: LVEF 35-40% with inferolateral hypokinesis, severe LVH, aortic sclerosis with mild  AI, dilated ascending aorta to 4.3 cm, moderate LAE, dilated IVC. - LHC June 2019:   1. Severe 2 vessel obstructive CAD. The entire coronary tree is severely calcified.     - 100% large first diagonal vessel. This fills late by left to left collaterals.     - 80% eccentric mid LAD     - 90% distal LAD     - 95% small third diagonal     - 95% RV marginal branch.     - 100% first PLOM     - 95% ongoing PL branch of the RCA  2. Mild to moderate LV dysfunction with global hypokinesis. EF estimated at 40-45%  3. Normal LVEDP Plan: - cardiology consulted - SL nitro, can advance to IV if needed - hold warfarin, start IV heparin once INR < 2 - trend troponin  - echo - NPO at MN for possible PCI - aspirin 81mg  daily, continue statin  - SL nitro prn chest pain  Chronic HFrEF: euvolemic  - continue home metoprolol, he is currently tachycardic and has not had this medication today  A. Fib: appears to be in NSR on my exam, rate is reasonably well controlled, slightly tachycardic HR 104, missed is A.M. dose of metoprolol.  - continue home metoprolol - hold amiodarone 2/2 long QT - hold warfarin in the setting of IV heparin   ESRD: HD TRS, last session was yesterday without complications. Reports good adherence to HD sessions and has maintained his dry weight of 125kg  - consult nephro for need of regular HD while inpatient   Prolonged QT: appears chronic but slightly worse, will hold prolonging meds for now amio and biktarvy, already received zofran and morphine in ED  HTN: well controlled. Continue home metoprolol   HLD: recheck lipid panel, home med rosuvastatin 20mg  qd - decrease rosubastatin to 10mg  qd due to ESRD  DM2: Last a1c 11.4. Home meds include glargine 30U nightly, lispro 10U BID wc - 5U novolog tid wc + SSI tid wc - lantus 15U nightly   HIV: well controlled. Last RNA quant was undetectable - holding home biktarvy 2/2 prolonged QT  GERD: continue home pantoprazole 40mg   bid  Diet: NPO at MN for possible heart cath Code: Full DVT ppx: heparin once INR < 2  Dispo: Admit patient to Inpatient with expected length of stay greater than 2 midnights.  Signed: Isabelle Course,  MD 01/22/2019, 9:47 PM  Pager: [phone]

## 2019-01-22 NOTE — ED Notes (Signed)
pts wife left  He is tired of waitinghere for a bed

## 2019-01-22 NOTE — ED Notes (Signed)
No pain

## 2019-01-22 NOTE — Telephone Encounter (Signed)
Good Days renewal for copay assistance ID  [id number] B  [id number] P  [id number] G  [id number]

## 2019-01-23 ENCOUNTER — Other Ambulatory Visit (HOSPITAL_COMMUNITY): Payer: Medicare Other

## 2019-01-23 DIAGNOSIS — E785 Hyperlipidemia, unspecified: Secondary | ICD-10-CM

## 2019-01-23 DIAGNOSIS — E1122 Type 2 diabetes mellitus with diabetic chronic kidney disease: Secondary | ICD-10-CM

## 2019-01-23 DIAGNOSIS — Z89512 Acquired absence of left leg below knee: Secondary | ICD-10-CM

## 2019-01-23 DIAGNOSIS — Z89511 Acquired absence of right leg below knee: Secondary | ICD-10-CM

## 2019-01-23 DIAGNOSIS — I132 Hypertensive heart and chronic kidney disease with heart failure and with stage 5 chronic kidney disease, or end stage renal disease: Secondary | ICD-10-CM

## 2019-01-23 DIAGNOSIS — Z7901 Long term (current) use of anticoagulants: Secondary | ICD-10-CM

## 2019-01-23 DIAGNOSIS — I4581 Long QT syndrome: Secondary | ICD-10-CM

## 2019-01-23 DIAGNOSIS — Z9716 Presence of artificial legs, bilateral (complete) (partial): Secondary | ICD-10-CM

## 2019-01-23 DIAGNOSIS — B20 Human immunodeficiency virus [HIV] disease: Secondary | ICD-10-CM

## 2019-01-23 DIAGNOSIS — I25110 Atherosclerotic heart disease of native coronary artery with unstable angina pectoris: Secondary | ICD-10-CM

## 2019-01-23 DIAGNOSIS — Z794 Long term (current) use of insulin: Secondary | ICD-10-CM

## 2019-01-23 DIAGNOSIS — E1151 Type 2 diabetes mellitus with diabetic peripheral angiopathy without gangrene: Secondary | ICD-10-CM

## 2019-01-23 DIAGNOSIS — I4891 Unspecified atrial fibrillation: Secondary | ICD-10-CM

## 2019-01-23 DIAGNOSIS — Z79899 Other long term (current) drug therapy: Secondary | ICD-10-CM

## 2019-01-23 DIAGNOSIS — K219 Gastro-esophageal reflux disease without esophagitis: Secondary | ICD-10-CM

## 2019-01-23 DIAGNOSIS — Z885 Allergy status to narcotic agent status: Secondary | ICD-10-CM

## 2019-01-23 LAB — CBC
HCT: 33.1 % — ABNORMAL LOW (ref 39.0–52.0)
Hemoglobin: 10.7 g/dL — ABNORMAL LOW (ref 13.0–17.0)
MCH: 31.2 pg (ref 26.0–34.0)
MCHC: 32.3 g/dL (ref 30.0–36.0)
MCV: 96.5 fL (ref 80.0–100.0)
Platelets: 201 10*3/uL (ref 150–400)
RBC: 3.43 MIL/uL — ABNORMAL LOW (ref 4.22–5.81)
RDW: 14.4 % (ref 11.5–15.5)
WBC: 6.1 10*3/uL (ref 4.0–10.5)
nRBC: 0 % (ref 0.0–0.2)

## 2019-01-23 LAB — COMPREHENSIVE METABOLIC PANEL
ALK PHOS: 88 U/L (ref 38–126)
ALT: 22 U/L (ref 0–44)
AST: 14 U/L — ABNORMAL LOW (ref 15–41)
Albumin: 3.5 g/dL (ref 3.5–5.0)
Anion gap: 17 — ABNORMAL HIGH (ref 5–15)
BUN: 38 mg/dL — ABNORMAL HIGH (ref 6–20)
CALCIUM: 8.3 mg/dL — AB (ref 8.9–10.3)
CO2: 21 mmol/L — ABNORMAL LOW (ref 22–32)
CREATININE: 10.12 mg/dL — AB (ref 0.61–1.24)
Chloride: 98 mmol/L (ref 98–111)
GFR calc Af Amer: 6 mL/min — ABNORMAL LOW (ref >60)
GFR calc non Af Amer: 5 mL/min — ABNORMAL LOW (ref >60)
Glucose, Bld: 310 mg/dL — ABNORMAL HIGH (ref 70–99)
Potassium: 4.1 mmol/L (ref 3.5–5.1)
Sodium: 136 mmol/L (ref 135–145)
TOTAL PROTEIN: 7.2 g/dL (ref 6.5–8.1)
Total Bilirubin: 1.2 mg/dL (ref 0.3–1.2)

## 2019-01-23 LAB — LIPID PANEL
Cholesterol: 89 mg/dL (ref 0–200)
HDL: 16 mg/dL — ABNORMAL LOW (ref >40)
LDL Cholesterol: UNDETERMINED mg/dL (ref 0–99)
Total CHOL/HDL Ratio: 5.6 RATIO
Triglycerides: 409 mg/dL — ABNORMAL HIGH (ref <150)
VLDL: UNDETERMINED mg/dL (ref 0–40)

## 2019-01-23 LAB — PROTIME-INR
INR: 3.05
Prothrombin Time: 31.1 seconds — ABNORMAL HIGH (ref 11.4–15.2)

## 2019-01-23 LAB — HEMOGLOBIN A1C
Hgb A1c MFr Bld: 11.9 % — ABNORMAL HIGH (ref 4.8–5.6)
Mean Plasma Glucose: 294.83 mg/dL

## 2019-01-23 LAB — GLUCOSE, CAPILLARY
GLUCOSE-CAPILLARY: 176 mg/dL — AB (ref 70–99)
Glucose-Capillary: 423 mg/dL — ABNORMAL HIGH (ref 70–99)
Glucose-Capillary: 378 mg/dL — ABNORMAL HIGH (ref 70–99)

## 2019-01-23 LAB — MRSA PCR SCREENING: MRSA by PCR: NEGATIVE

## 2019-01-23 LAB — TROPONIN I: Troponin I: 0.08 ng/mL (ref <0.03)

## 2019-01-23 MED ORDER — INSULIN ASPART 100 UNIT/ML ~~LOC~~ SOLN
7.0000 [IU] | Freq: Three times a day (TID) | SUBCUTANEOUS | Status: DC
  Administered 2019-01-23 – 2019-01-24 (×2): 7 [IU] via SUBCUTANEOUS

## 2019-01-23 MED ORDER — AMIODARONE HCL 200 MG PO TABS
200.0000 mg | ORAL_TABLET | Freq: Every day | ORAL | Status: DC
  Administered 2019-01-23 – 2019-01-26 (×3): 200 mg via ORAL
  Filled 2019-01-23 (×3): qty 1

## 2019-01-23 MED ORDER — INSULIN GLARGINE 100 UNIT/ML ~~LOC~~ SOLN
20.0000 [IU] | Freq: Every day | SUBCUTANEOUS | Status: DC
  Administered 2019-01-23: 20 [IU] via SUBCUTANEOUS
  Filled 2019-01-23: qty 0.2

## 2019-01-23 MED ORDER — ASPIRIN 81 MG PO CHEW
81.0000 mg | CHEWABLE_TABLET | Freq: Every day | ORAL | Status: DC
  Administered 2019-01-23 – 2019-01-26 (×3): 81 mg via ORAL
  Filled 2019-01-23 (×3): qty 1

## 2019-01-23 MED ORDER — CHLORHEXIDINE GLUCONATE CLOTH 2 % EX PADS: 6.0000 | MEDICATED_PAD | Freq: Every day | CUTANEOUS | Status: DC

## 2019-01-23 MED ORDER — BICTEGRAVIR-EMTRICITAB-TENOFOV 50-200-25 MG PO TABS
1.0000 | ORAL_TABLET | Freq: Every day | ORAL | Status: DC
  Administered 2019-01-23 – 2019-01-26 (×3): 1 via ORAL
  Filled 2019-01-23 (×5): qty 1

## 2019-01-23 NOTE — Progress Notes (Signed)
I was unable to perform the echo because the patient was in dialysis for most of the day and at this time.

## 2019-01-23 NOTE — ED Notes (Signed)
No chest pain

## 2019-01-23 NOTE — Consult Note (Signed)
Renal Service Consult Note [facility]  Thomas [name] 01/23/2019 Sol [place] Requesting Physician:  Dr Daryll Drown, E.   Reason for Consult:  ESRD pt w/ chest pain HPI: The patient is a 53 y.o. year-old with hx of HIV, DM2, bilat AKA, HL, combined CHF, CAD , afib and esrd on HD TTS.  Pt was admitted yest for chest pain. We are asked to see for ESRD.    In 2019 pt was here in Jan w/ MW tear rx w/ PPI. In may - June he was here 3 times w/ afib/ RVR , the 3rd time was put on amiodarone and had LHC.  LHC showed severe 2V CAD w/ Ca++, medical Rx.  Last admit was 0ctober for atypical CP, medical Rx.   Pt w/o chest pain now.  No sob, edema, fevers, chills, n/v/d. No access issues recently.    ROS  denies CP  no joint pain   no HA  no blurry vision  no rash  no diarrhea  no nausea/ vomiting    Past Medical History  Past Medical History:  Diagnosis Date  . Anemia   . Atrial fibrillation ([place])   . CAD (coronary artery disease), native coronary artery 01/22/2019  . Chest pain with moderate risk for cardiac etiology 06/15/2018  . Chronic combined systolic and diastolic CHF (congestive heart failure) ([place])   . Diabetic foot ulcer ([place]) 09/03/2017  . Diabetic wet gangrene of the foot ([place]) 09/04/2017  . ESRD (end stage renal disease) on dialysis [facility])    [place] [address], [place], [place] (10/15/2018)  . Headache    "monthly" (06/15/2018)  . HIV disease ([place])   . Hyperlipidemia LDL goal <70 01/22/2019  . Hyperparathyroidism, secondary ([place])   . Hypertension   . Hypertensive heart disease with end stage renal disease on dialysis ([place]) 03/13/2017  . Mallory-Weiss tear 2018  . Pneumonia X 1  . Subacute osteomyelitis, right ankle and foot ([place])   . SVT (supraventricular tachycardia) ([place])    ? afib or atrial flutter s/p TEE/DCCV with subsequent ablation due to reoccurrence in [place]  . Type 2 diabetes mellitus [facility])    Past Surgical History  Past Surgical History:  Procedure  Laterality Date  . AMPUTATION Left    foot  . AMPUTATION Right 12/06/2017   Procedure: AMPUTATION BELOW KNEE;  Surgeon: [name], MD;  Location: [place];  Service: Orthopedics;  Laterality: Right;  . APPLICATION OF WOUND VAC Right 09/04/2017   Procedure: APPLICATION OF WOUND VAC;  Surgeon: [name], DPM;  Location: [place];  Service: Podiatry;  Laterality: Right;  . APPLICATION OF WOUND VAC  12/06/2017   Procedure: APPLICATION OF WOUND VAC;  Surgeon: [name], MD;  Location: [place];  Service: Orthopedics;;  . AV FISTULA PLACEMENT Right 10/19/2012  . AV FISTULA REPAIR Right ~ 02/2018   "had it cleaned out"  . BELOW KNEE LEG AMPUTATION Left ~ 2016  . COLONOSCOPY WITH PROPOFOL N/A 07/30/2017   Procedure: COLONOSCOPY WITH PROPOFOL;  Surgeon: [name], MD;  Location: [place];  Service: Endoscopy;  Laterality: N/A;  . ESOPHAGOGASTRODUODENOSCOPY (EGD) WITH PROPOFOL N/A 07/28/2017   Procedure: ESOPHAGOGASTRODUODENOSCOPY (EGD) WITH PROPOFOL;  Surgeon: [name], MD;  Location: [place];  Service: Endoscopy;  Laterality: N/A;  . ESOPHAGOGASTRODUODENOSCOPY (EGD) WITH PROPOFOL N/A 01/15/2018   Procedure: ESOPHAGOGASTRODUODENOSCOPY (EGD) WITH PROPOFOL;  Surgeon: [name], MD;  Location: [facility] ENDOSCOPY;  Service: Endoscopy;  Laterality: N/A;  . FLEXIBLE  SIGMOIDOSCOPY N/A 07/27/2017   Procedure: FLEXIBLE SIGMOIDOSCOPY;  Surgeon: [name], MD;  Location: [facility] ENDOSCOPY;  Service: Endoscopy;  Laterality: N/A;  . GRAFT APPLICATION Right 42/35/3614   Procedure: SKIN GRAFT APPLICATION RIGHT FOOT;  Surgeon: [name], DPM;  Location: [place];  Service: Podiatry;  Laterality: Right;  . I&D EXTREMITY Right 09/04/2017   Procedure: IRRIGATION AND DEBRIDEMENT EXTREMITY;  Surgeon: [name], DPM;  Location: [place];  Service: Podiatry;  Laterality: Right;  . I&D EXTREMITY Right 11/12/2017   Procedure: IRRIGATION AND DEBRIDEMENT ULCER RIGHT FOOT;  Surgeon: [name],  DPM;  Location: [place];  Service: Podiatry;  Laterality: Right;  . LEFT HEART CATH AND CORONARY ANGIOGRAPHY N/A 06/18/2018   Procedure: LEFT HEART CATH AND CORONARY ANGIOGRAPHY;  Surgeon: [name], Peter M, MD;  Location: [place] CV LAB;  Service: Cardiovascular;  Laterality: N/A;  . WOUND DEBRIDEMENT N/A 09/24/2017   Procedure: DEBRIDEMENT WOUND;  Surgeon: [name], DPM;  Location: [place];  Service: Podiatry;  Laterality: N/A;   Family History  Family History  Problem Relation Age of Onset  . Heart disease Mother   . Diabetes Mother   . Heart disease Father   . Cancer Neg Hx    Social History  reports that he has never smoked. He has never used smokeless tobacco. He reports that he does not drink alcohol or use drugs. Allergies  Allergies  Allergen Reactions  . Oxycodone-Acetaminophen Other (See Comments)    Hallucinations/delirum   Home medications Prior to Admission medications   Medication Sig Start Date End Date Taking? Authorizing Provider  acetaminophen (TYLENOL) 325 MG tablet Take 650 mg by mouth every 6 (six) hours as needed (for headaches).    Yes [provider]  amiodarone (PACERONE) 200 MG tablet Take 1 tablet (200 mg total) by mouth daily. Patient taking differently: Take 200 mg by mouth at bedtime.  08/05/18  Yes Simmons, Brittainy M, PA-C  bictegravir-emtricitabine-tenofovir AF (BIKTARVY) 50-200-25 MG TABS tablet Take 1 tablet by mouth daily. 03/02/18  Yes Carlyle Basques, MD  camphor-menthol [facility]) lotion Apply topically as needed for itching. 12/11/17  Yes Colbert Ewing, MD  cinacalcet (SENSIPAR) 90 MG tablet Take 90 mg by mouth daily. 04/27/18  Yes [provider]  ferric citrate (AURYXIA) 1 GM 210 MG(Fe) tablet Take 420-840 mg by mouth See admin instructions. Take 840 mg by mouth three times a day after meals and 420 mg after each snack   Yes [provider]  gabapentin (NEURONTIN) 100 MG capsule Take 1 capsule (100 mg total) by mouth  every dialysis. 01/09/18  Yes Jamse Arn, MD  insulin glargine (LANTUS) 100 unit/mL SOPN Inject 0.3 mLs (30 Units total) into the skin at bedtime. Patient taking differently: Inject 20 Units into the skin at bedtime.  12/19/17  Yes Angiulli, Lavon Paganini, PA-C  insulin lispro (HUMALOG) 100 UNIT/ML KiwkPen Please inject 10 units, twice daily, ~10 min before your two largest meals of the day. Patient taking differently: Inject 10 Units into the skin See admin instructions. Inject 10 units into the skin two times a day- 10 minutes before the 2 largest meals of the day 10/29/18  Yes Kathi Ludwig, MD  linagliptin (TRADJENTA) 5 MG TABS tablet Take 1 tablet (5 mg total) by mouth daily. 12/07/18  Yes Helberg, Larkin Ina, MD  metoprolol succinate (TOPROL-XL) 100 MG 24 hr tablet TAKE 1 TABLET BY MOUTH ONCE DAILY TAKE WITH OR IMMEDIATELY FOLLOWING A MEAL Patient taking differently:  Take 100 mg by mouth daily. TAKE WITH OR IMMEDIATELY FOLLOWING A MEAL 08/05/18  Yes Lyda Jester M, PA-C  Naproxen Sodium (ALEVE PO) Take 1-2 capsules by mouth every 8 (eight) hours as needed (for headaches or pain).   Yes [provider]  pantoprazole (PROTONIX) 40 MG tablet Take 2 tablets (80 mg total) by mouth daily. Patient taking differently: Take 40 mg by mouth 2 (two) times daily.  02/16/18  Yes Alphonzo Grieve, MD  polycarbophil (FIBERCON) 625 MG tablet Take 1 tablet (625 mg total) by mouth daily. 12/19/17  Yes Angiulli, Lavon Paganini, PA-C  rosuvastatin (CRESTOR) 20 MG tablet TAKE 1 TABLET BY MOUTH ONCE DAILY AT 6PM Patient taking differently: Take 20 mg by mouth daily.  08/19/18  Yes [name], MD  warfarin (COUMADIN) 7.5 MG tablet TAKE 1/2 TABLET BY MOUTH DAILY, EXCEPT 1 TABLET ON Monday, Wednesday, AND Saturday OR AS DIRECTED BY ANTICOAGULATION CLINIC. Patient taking differently: Take 3.75-7.5 mg by mouth at bedtime. Take 3.75 mg by mouth at bedtime on Sun/Tues/Thurs/Fri and 7.5 mg on Mon/Wed/Sat 12/25/18   Yes Turner, Eber Hong, MD  ferric gluconate 125 mg in sodium chloride 0.9 % 100 mL Inject 125 mg into the vein Every Tuesday,Thursday,and Saturday with dialysis. Patient not taking: Reported on 01/22/2019 05/10/17   Hosie Poisson, MD   Liver Function Tests Recent Labs  Lab 01/23/19 0314  AST 14*  ALT 22  ALKPHOS 88  BILITOT 1.2  PROT 7.2  ALBUMIN 3.5   No results for input(s): LIPASE, AMYLASE in the last 168 hours. CBC Recent Labs  Lab 01/22/19 1514 01/23/19 0314  WBC 6.6 6.1  HGB 11.0* 10.7*  HCT 33.3* 33.1*  MCV 95.7 96.5  PLT 197 132   Basic Metabolic Panel Recent Labs  Lab 01/22/19 1514 01/23/19 0314  NA 134* 136  K 4.3 4.1  CL 95* 98  CO2 24 21*  GLUCOSE 298* 310*  BUN 30* 38*  CREATININE 8.95* 10.12*  CALCIUM 8.6* 8.3*   Iron/TIBC/Ferritin/ %Sat    Component Value Date/Time   IRON 47 01/15/2018 1508   TIBC 192 (L) 01/15/2018 1508   IRONPCTSAT 25 01/15/2018 1508    Vitals:   01/23/19 0700 01/23/19 0745 01/23/19 0800 01/23/19 0827  BP:   123/73 115/85  Pulse: (!) 108 (!) 114  (!) 102  Resp: (!) 23 (!) 26  (!) 28  Temp:   97.9 F (36.6 C) 98.8 F (37.1 C)  TempSrc:   Oral Oral  SpO2: 91% 96%  95%   Exam Gen obese pleasant AAM in no distress No rash, cyanosis or gangrene Sclera anicteric, throat clear  No jvd or bruits Chest clear bilat to bases RRR no MRG Abd soft ntnd no mass or ascites +bs GU normal male MS no joint effusion, bilat AKA Ext no LE edema, no wounds or ulcers Neuro is alert, Ox 3 , nf    Home meds:  - amiodarone 200 / metoprolol xl 100 qd/ rosuvastatin 10 qd  - pantoprazole 40 bid  - bictegravir-emtricitabine-tenofovir AF 1 qd  - more pending   Dialysis: [facility] (2013)  4h 1min  123kg   3K/2.25 bath  RFA AVF  500/800   Hep none  - mircera 30 ug every 4 wks  - hect 6 ug tiw     Assessment: 1. Chest pain/ severe 2V CAD 2. ESRD on HD TTS 3. Vol stable on exam, up 5-6kg 4. Atrial fib on amio/ coumadin 5. MBD ckd -  cont meds  6. Anemia ckd - get records 7. HIV    P: 1. HD today, max UF       [name] MD [facility] pager [phone]   01/23/2019, 9:28 AM

## 2019-01-23 NOTE — ED Notes (Signed)
Regular hosp received  Pt moved to bed for comfort

## 2019-01-23 NOTE — ED Notes (Signed)
02  Offered  Pt refused

## 2019-01-23 NOTE — Procedures (Signed)
   I was present at this dialysis session, have reviewed the session itself and made  appropriate changes Rob Braxley Balandran MD [facility] pager [phone]   01/23/2019, 1:41 PM    

## 2019-01-23 NOTE — Progress Notes (Addendum)
Progress Note  Patient Name: Thomas [name] Date of Encounter: 01/23/2019  Primary Cardiologist: Fransico Him, MD   Subjective   No CP or dyspnea  Inpatient Medications    Scheduled Meds: . Chlorhexidine Gluconate Cloth  6 each Topical Q0600  . insulin aspart  0-15 Units Subcutaneous TID WC  . insulin aspart  0-5 Units Subcutaneous QHS  . insulin aspart  5 Units Subcutaneous TID WC  . insulin glargine  15 Units Subcutaneous QHS  . metoprolol succinate  100 mg Oral Daily  . pantoprazole  40 mg Oral BID  . ramelteon  8 mg Oral QHS  . rosuvastatin  10 mg Oral Daily  . sodium chloride flush  3 mL Intravenous Once   Continuous Infusions:  PRN Meds: nitroGLYCERIN   Vital Signs    Vitals:   01/23/19 0745 01/23/19 0800 01/23/19 0827 01/23/19 0929  BP:  123/73 115/85   Pulse: (!) 114  (!) 102   Resp: (!) 26  (!) 28   Temp:  97.9 F (36.6 C) 98.8 F (37.1 C)   TempSrc:  Oral Oral   SpO2: 96%  95%   Height:    6\' 5"  (1.956 m)    Intake/Output Summary (Last 24 hours) at 01/23/2019 1028 Last data filed at 01/23/2019 0700 Gross per 24 hour  Intake 0 ml  Output 0 ml  Net 0 ml   Last 3 Weights 01/22/2019 12/07/2018 10/26/2018  Weight (lbs) 286 lb 297 lb 1.6 oz 293 lb 4.8 oz  Weight (kg) 129.729 kg 134.764 kg 133.04 kg      Telemetry    Atrial flutter with HR 100 - Personally Reviewed  ECG    Atrial flutter, prolonged QT interval, right axis deviation.- Personally Reviewed  Physical Exam   GEN: No acute distress.   Neck: No JVD Cardiac: RRR, no murmurs, rubs, or gallops.  Respiratory: Clear to auscultation bilaterally. GI: Soft, nontender, non-distended  MS: s/p bilateral BKA Neuro:  Nonfocal  Psych: Normal affect   Labs    Chemistry Recent Labs  Lab 01/22/19 1514 01/23/19 0314  NA 134* 136  K 4.3 4.1  CL 95* 98  CO2 24 21*  GLUCOSE 298* 310*  BUN 30* 38*  CREATININE 8.95* 10.12*  CALCIUM 8.6* 8.3*  PROT  --  7.2  ALBUMIN  --  3.5  AST  --  14*   ALT  --  22  ALKPHOS  --  88  BILITOT  --  1.2  GFRNONAA 6* 5*  GFRAA 7* 6*  ANIONGAP 15 17*     Hematology Recent Labs  Lab 01/22/19 1514 01/23/19 0314  WBC 6.6 6.1  RBC 3.48* 3.43*  HGB 11.0* 10.7*  HCT 33.3* 33.1*  MCV 95.7 96.5  MCH 31.6 31.2  MCHC 33.0 32.3  RDW 14.3 14.4  PLT 197 201    Cardiac Enzymes Recent Labs  Lab 01/22/19 2209 01/23/19 0314  TROPONINI 0.09* 0.08*    Recent Labs  Lab 01/22/19 1603  TROPIPOC 0.10*     Radiology    Dg Chest 2 View  Result Date: 01/22/2019 CLINICAL DATA:  Chest pain and shortness of breath EXAM: CHEST - 2 VIEW COMPARISON:  October 15, 2018 FINDINGS: Cardiomegaly. The hila and mediastinum are unremarkable. No pneumothorax. Haziness over the bases is symmetric and probably due to overlapping soft tissues. Subtle infiltrates not excluded. No other acute abnormalities. IMPRESSION: Haziness over the lung bases is symmetric and favored to be due to overlapping soft  tissues. Subtle infiltrates considered less likely. Electronically Signed   By: Dorise Bullion III M.D   On: 01/22/2019 16:05    Patient Profile     53 y.o. male with past medical history of coronary artery disease, combined systolic/diastolic congestive heart failure, atrial flutter, end-stage renal disease on dialysis, diabetes mellitus, hypertension, hyperlipidemia, prior bilateral BKA, HIV admitted with chest pain.  Last catheterization June 2019 showed an 80% mid LAD, 95% D3, 90% distal LAD, occluded first diagonal, 95% acute marginal, 95% posterior atrial branch and ejection fraction 40 to 45%.  Medical therapy recommended unless refractory angina and PCI of LAD could be considered.  Assessment & Plan    1 chest pain-patient is pain-free this morning.  Troponins minimally elevated but no clear trend and not consistent with acute coronary syndrome.  He has had difficulties with recurrent chest pain and per Dr. [name] plan will be cardiac catheterization to see if  LAD lesion amenable to PCI.  The risks and benefits including myocardial infarction, CVA and death discussed and he agrees to proceed.  We will need to hold until INR less than 1.8.  2 atrial flutter-patient in atrial flutter with upper normal to mildly elevated heart rate.  Continue beta-blocker; resume amiodarone.  Coumadin on hold for upcoming catheterization.  Begin heparin when INR less than 2.  Resume Coumadin when all procedures complete.  Can consider cardioversion in the future.  3 coronary artery disease-continue statin.  Given upcoming catheterization we will add aspirin 81 mg daily.  Continue statin.  4 end-stage renal disease-dialysis per nephrology.  5 hypertension-blood pressure is controlled this morning.  Continue present medications and follow.  6 hyperlipidemia-continue Crestor.  7 history of cardiomyopathy-repeat echocardiogram.  Continue beta-blocker.  If LV function significantly reduced would consider hydralazine nitrates.  For questions or updates, please contact [place] Please consult [email or web address] for contact info under        Signed, Kirk Ruths, MD  01/23/2019, 10:28 AM

## 2019-01-23 NOTE — ED Notes (Signed)
Pt awake whenever hiks monitor alarmed.  No chest pain

## 2019-01-23 NOTE — Progress Notes (Signed)
   Subjective: feeling well today without any further chest pain. States that he feels it was related to his a fib. He states that anytime he converts in a fib. Staff shortness of breath, fatigue, and chest pain. Baseline he is typically able to walk as much as he wants without any shortness of breath or chest pain. He is amendable to any type of procedure cardiology wants to do because he is tired of being admitted to the hospital. He reiterates Cardiology's plan for left heart cath on Monday. All questions and concerns addressed.  Objective: Vital signs in last 24 hours: Vitals:   01/23/19 0745 01/23/19 0800 01/23/19 0827 01/23/19 0929  BP:  123/73 115/85   Pulse: (!) 114  (!) 102   Resp: (!) 26  (!) 28   Temp:  97.9 F (36.6 C) 98.8 F (37.1 C)   TempSrc:  Oral Oral   SpO2: 96%  95%   Height:    6\' 5"  (1.956 m)   Gen: Obese male in no acute distress Pulm: Good air movement with no wheezing or crackles  CV: Tachycardic, irregular rhythm   Assessment/Plan:  Thomas [name] is a 53 y.o. male p/w s/s consistent with unstable angina and overall feeling poorly. Was seen by cardiologist outpatient who recommended admission to medicine service and possible repeat LHC  CAD Unstable Angina:  - Chest pain has resolved.  - Troponin and EKG reassuring, no evidence of STEMI - Cardiology will schedule patient for LHC  Ischemic Cardiomyopathy  Chronic HFrEF - Euvolemic - Volume status managed with HD  - Continue metoprolol   A. Fib - Continue home metoprolol - Cardiology restarted Amiodarone  - Holding warfarin, once INR <2.0 will start heparin per cardiology   ESRD - Patient to go for HD today  - Nephrology onboard   DM2 - CBGs above goal  - Continue Lantus 20 untis QHS + 7 units Novolog TID WC + SSI  Prolonged QT - Will repeat EKG  - Discontinue zofran  - Monitor for QT prolongation   HTN: Well controlled. Continue home metoprolol  HLD: Continue rosuvastatin 10mg  qd due  to ESRD HIV: well controlled. Last RNA quant was undetectable. Continue biktarvy  GERD: continue home pantoprazole 40mg  bid  Dispo: Anticipated discharge in approximately 3-4 day(s).   [name], MD 01/23/2019, 11:31 AM

## 2019-01-23 NOTE — ED Notes (Signed)
Pt is having no chest pain

## 2019-01-24 ENCOUNTER — Other Ambulatory Visit (HOSPITAL_COMMUNITY): Payer: Medicare Other

## 2019-01-24 ENCOUNTER — Encounter (HOSPITAL_COMMUNITY): Payer: Self-pay

## 2019-01-24 ENCOUNTER — Inpatient Hospital Stay (HOSPITAL_COMMUNITY): Payer: Medicare Other

## 2019-01-24 DIAGNOSIS — I351 Nonrheumatic aortic (valve) insufficiency: Secondary | ICD-10-CM

## 2019-01-24 DIAGNOSIS — I361 Nonrheumatic tricuspid (valve) insufficiency: Secondary | ICD-10-CM

## 2019-01-24 DIAGNOSIS — I340 Nonrheumatic mitral (valve) insufficiency: Secondary | ICD-10-CM

## 2019-01-24 LAB — GLUCOSE, CAPILLARY
Glucose-Capillary: 331 mg/dL — ABNORMAL HIGH (ref 70–99)
Glucose-Capillary: 266 mg/dL — ABNORMAL HIGH (ref 70–99)
Glucose-Capillary: 140 mg/dL — ABNORMAL HIGH (ref 70–99)
Glucose-Capillary: 207 mg/dL — ABNORMAL HIGH (ref 70–99)

## 2019-01-24 LAB — ECHOCARDIOGRAM COMPLETE
Height: 77 in
Weight: 4328.07 oz

## 2019-01-24 LAB — PROTIME-INR
INR: 2.23
Prothrombin Time: 24.4 seconds — ABNORMAL HIGH (ref 11.4–15.2)

## 2019-01-24 MED ORDER — ASPIRIN 81 MG PO CHEW: 81.0000 mg | CHEWABLE_TABLET | ORAL | Status: DC

## 2019-01-24 MED ORDER — POLYETHYLENE GLYCOL 3350 17 G PO PACK
17.0000 g | PACK | Freq: Every day | ORAL | Status: DC | PRN
  Administered 2019-01-24 – 2019-01-25 (×2): 17 g via ORAL
  Filled 2019-01-24 (×2): qty 1

## 2019-01-24 MED ORDER — INSULIN GLARGINE 100 UNIT/ML ~~LOC~~ SOLN
25.0000 [IU] | Freq: Every day | SUBCUTANEOUS | Status: DC
  Administered 2019-01-24 – 2019-01-25 (×2): 25 [IU] via SUBCUTANEOUS
  Filled 2019-01-24 (×3): qty 0.25

## 2019-01-24 MED ORDER — SODIUM CHLORIDE 0.9% FLUSH
3.0000 mL | Freq: Two times a day (BID) | INTRAVENOUS | Status: DC
  Administered 2019-01-25: 3 mL via INTRAVENOUS

## 2019-01-24 MED ORDER — SODIUM CHLORIDE 0.9 % IV SOLN: 250.0000 mL | INTRAVENOUS | Status: DC | PRN

## 2019-01-24 MED ORDER — SODIUM CHLORIDE 0.9 % IV SOLN
INTRAVENOUS | Status: DC
  Administered 2019-01-25: 01:00:00 via INTRAVENOUS

## 2019-01-24 MED ORDER — SODIUM CHLORIDE 0.9% FLUSH
3.0000 mL | INTRAVENOUS | Status: DC | PRN
  Administered 2019-01-24: 3 mL via INTRAVENOUS
  Filled 2019-01-24: qty 3

## 2019-01-24 MED ORDER — INSULIN ASPART 100 UNIT/ML ~~LOC~~ SOLN
10.0000 [IU] | Freq: Three times a day (TID) | SUBCUTANEOUS | Status: DC
  Administered 2019-01-24 – 2019-01-26 (×5): 10 [IU] via SUBCUTANEOUS

## 2019-01-24 NOTE — Progress Notes (Signed)
Progress Note  Patient Name: Thomas [name] Date of Encounter: 01/24/2019  Primary Cardiologist: Fransico Him, MD   Subjective   Denies CP or dyspnea  Inpatient Medications    Scheduled Meds: . amiodarone  200 mg Oral Daily  . aspirin  81 mg Oral Daily  . bictegravir-emtricitabine-tenofovir AF  1 tablet Oral Daily  . Chlorhexidine Gluconate Cloth  6 each Topical Q0600  . insulin aspart  0-15 Units Subcutaneous TID WC  . insulin aspart  0-5 Units Subcutaneous QHS  . insulin aspart  7 Units Subcutaneous TID WC  . insulin glargine  20 Units Subcutaneous QHS  . metoprolol succinate  100 mg Oral Daily  . pantoprazole  40 mg Oral BID  . ramelteon  8 mg Oral QHS  . rosuvastatin  10 mg Oral Daily   Continuous Infusions:  PRN Meds: nitroGLYCERIN, polyethylene glycol   Vital Signs    Vitals:   01/23/19 2100 01/23/19 2300 01/24/19 0300 01/24/19 0730  BP:  124/76 (!) 140/92 129/80  Pulse:  78 75 75  Resp: 18 20 20  (!) 29  Temp:  98.6 F (37 C) 98.5 F (36.9 C) 98.8 F (37.1 C)  TempSrc:  Oral Oral Oral  SpO2:  98% 98%   Weight:      Height:        Intake/Output Summary (Last 24 hours) at 01/24/2019 0842 Last data filed at 01/24/2019 0741 Gross per 24 hour  Intake 0 ml  Output 2000 ml  Net -2000 ml   Last 3 Weights 01/23/2019 01/23/2019 01/22/2019  Weight (lbs) 270 lb 8.1 oz 274 lb 0.5 oz 286 lb  Weight (kg) 122.7 kg 124.3 kg 129.729 kg      Telemetry    Converted to sinus - Personally Reviewed   Physical Exam   GEN: No acute distress.  WD WN Neck: No JVD, supple Cardiac: RRR Respiratory: CTA GI: Soft, NT/ND MS: s/p bilateral BKA Neuro:  Grossly intact   Labs    Chemistry Recent Labs  Lab 01/22/19 1514 01/23/19 0314  NA 134* 136  K 4.3 4.1  CL 95* 98  CO2 24 21*  GLUCOSE 298* 310*  BUN 30* 38*  CREATININE 8.95* 10.12*  CALCIUM 8.6* 8.3*  PROT  --  7.2  ALBUMIN  --  3.5  AST  --  14*  ALT  --  22  ALKPHOS  --  88  BILITOT  --  1.2    GFRNONAA 6* 5*  GFRAA 7* 6*  ANIONGAP 15 17*     Hematology Recent Labs  Lab 01/22/19 1514 01/23/19 0314  WBC 6.6 6.1  RBC 3.48* 3.43*  HGB 11.0* 10.7*  HCT 33.3* 33.1*  MCV 95.7 96.5  MCH 31.6 31.2  MCHC 33.0 32.3  RDW 14.3 14.4  PLT 197 201    Cardiac Enzymes Recent Labs  Lab 01/22/19 2209 01/23/19 0314  TROPONINI 0.09* 0.08*    Recent Labs  Lab 01/22/19 1603  TROPIPOC 0.10*     Radiology    Dg Chest 2 View  Result Date: 01/22/2019 CLINICAL DATA:  Chest pain and shortness of breath EXAM: CHEST - 2 VIEW COMPARISON:  October 15, 2018 FINDINGS: Cardiomegaly. The hila and mediastinum are unremarkable. No pneumothorax. Haziness over the bases is symmetric and probably due to overlapping soft tissues. Subtle infiltrates not excluded. No other acute abnormalities. IMPRESSION: Haziness over the lung bases is symmetric and favored to be due to overlapping soft tissues. Subtle infiltrates considered less likely.  Electronically Signed   By: Dorise Bullion III M.D   On: 01/22/2019 16:05    Patient Profile     53 y.o. male with past medical history of coronary artery disease, combined systolic/diastolic congestive heart failure, atrial flutter, end-stage renal disease on dialysis, diabetes mellitus, hypertension, hyperlipidemia, prior bilateral BKA, HIV admitted with chest pain.  Last catheterization June 2019 showed an 80% mid LAD, 95% D3, 90% distal LAD, occluded first diagonal, 95% acute marginal, 95% posterior atrial branch and ejection fraction 40 to 45%.  Medical therapy recommended unless refractory angina and PCI of LAD could be considered.  Assessment & Plan    1 chest pain-patient remains pain-free.  As outlined previously he has had difficulties with recurrent chest pain and plan per Dr. [name] is to proceed with cardiac catheterization to see if LAD lesion is amenable to PCI.  Hold Coumadin.  Proceed with catheterization when INR less than 1.8.    2 atrial  flutter-patient has converted to sinus rhythm.  Continue amiodarone and beta-blocker.  Begin heparin when INR less than 2.  Resume Coumadin once all procedures complete.   3 coronary artery disease-continue statin.  Given upcoming catheterization continue aspirin 81 mg daily.  4 end-stage renal disease-dialysis per nephrology.  5 hypertension-blood pressure is controlled this morning.  Continue present medications and follow.  6 hyperlipidemia-continue Crestor.  7 history of cardiomyopathy-repeat echocardiogram pending.  Continue beta-blocker.  If LV function significantly reduced would consider hydralazine nitrates.  For questions or updates, please contact [place] Please consult [email or web address] for contact info under        Signed, Kirk Ruths, MD  01/24/2019, 8:42 AM

## 2019-01-24 NOTE — Progress Notes (Signed)
Echocardiogram 2D Echocardiogram has been performed.  Thomas [name] 01/24/2019, 9:31 AM

## 2019-01-24 NOTE — Progress Notes (Addendum)
   Subjective: No overnight events. Thomas [name] reports that he is hungry this morning. He has no chest pain. His only acute concern is constipation and wanting to shower. He knows the plan is for [facility] tomorrow.   Objective:  Vital signs in last 24 hours: Vitals:   01/23/19 1900 01/23/19 2100 01/23/19 2300 01/24/19 0300  BP: (!) 109/58  124/76 (!) 140/92  Pulse: 78  78 75  Resp: 16 18 20 20   Temp: 98.7 F (37.1 C)  98.6 F (37 C) 98.5 F (36.9 C)  TempSrc: Oral  Oral Oral  SpO2: 97%  98% 98%  Weight:      Height:       Gen: sitting up comfortably in bed, no distress CV: irregularly irregular. Pulm: ctab, normal effort Ext: bilateral BKA  Assessment/Plan:  Principal Problem:   Unstable angina (HCC) Active Problems:   Hypertensive heart disease with end stage renal disease on dialysis ([place])   ESRD on dialysis ([place])   Type 2 diabetes mellitus with complication (HCC)   HIV disease ([place])   PAF (paroxysmal atrial fibrillation) (HCC)  Thomas [name] is a 53 y.o. male with a medical history of CAD, combined systolic/diastolic heart failure, HTN, HLD, prior bilateral BKA, and HIV who presented with symptoms consistent with unstable angina. He was seen by an outpatient cardiologist who recommended admission to medicine service and possible repeat LHC  CAD Unstable Angina:  - Chest pain has resolved.  - Troponin and EKG reassuring, no evidence of STEMI Plan - Cardiology will schedule patient for LHC (likely tomorrow) - Continue aspirin and statin - ECHO  Ischemic Cardiomyopathy  Chronic HFrEF - Euvolemic - Volume status managed with HD  - Continue metoprolol   A. Fib - Continue home metoprolol - Cardiology restarted Amiodarone  - Holding warfarin, INR 2.2 this am, once INR <2.0 will start heparin per cardiology   ESRD - Last dialysis 1/25 - Nephrology onboard   DM2 - CBGs above goal. Am glucose 331.  - Increase Lantus to 25 units QHS + increase mealtime to 10  units Novolog TID WC + SSI  Prolonged QT - Repeat EKG shows persistent QT prolongation - Discontinue zofran  - Avoid QT prolonging medications  PJS:RPRX controlled. Continue home metoprolol  HLD: Continue rosuvastatin 10mg  qd due to ESRD YVO:PFYT controlled. Last RNA quant was undetectable. Continue biktarvy GERD: Continue home pantoprazole 40mg  bid  Dispo: Anticipated discharge in approximately 2-3 day(s).   Thomas [name], Andree Elk, MD 01/24/2019, 6:38 AM Pager: [phone]

## 2019-01-24 NOTE — H&P (View-Only) (Signed)
Progress Note  Patient Name: Thomas [name] Date of Encounter: 01/24/2019  Primary Cardiologist: Fransico Him, MD   Subjective   Denies CP or dyspnea  Inpatient Medications    Scheduled Meds: . amiodarone  200 mg Oral Daily  . aspirin  81 mg Oral Daily  . bictegravir-emtricitabine-tenofovir AF  1 tablet Oral Daily  . Chlorhexidine Gluconate Cloth  6 each Topical Q0600  . insulin aspart  0-15 Units Subcutaneous TID WC  . insulin aspart  0-5 Units Subcutaneous QHS  . insulin aspart  7 Units Subcutaneous TID WC  . insulin glargine  20 Units Subcutaneous QHS  . metoprolol succinate  100 mg Oral Daily  . pantoprazole  40 mg Oral BID  . ramelteon  8 mg Oral QHS  . rosuvastatin  10 mg Oral Daily   Continuous Infusions:  PRN Meds: nitroGLYCERIN, polyethylene glycol   Vital Signs    Vitals:   01/23/19 2100 01/23/19 2300 01/24/19 0300 01/24/19 0730  BP:  124/76 (!) 140/92 129/80  Pulse:  78 75 75  Resp: 18 20 20  (!) 29  Temp:  98.6 F (37 C) 98.5 F (36.9 C) 98.8 F (37.1 C)  TempSrc:  Oral Oral Oral  SpO2:  98% 98%   Weight:      Height:        Intake/Output Summary (Last 24 hours) at 01/24/2019 0842 Last data filed at 01/24/2019 0741 Gross per 24 hour  Intake 0 ml  Output 2000 ml  Net -2000 ml   Last 3 Weights 01/23/2019 01/23/2019 01/22/2019  Weight (lbs) 270 lb 8.1 oz 274 lb 0.5 oz 286 lb  Weight (kg) 122.7 kg 124.3 kg 129.729 kg      Telemetry    Converted to sinus - Personally Reviewed   Physical Exam   GEN: No acute distress.  WD WN Neck: No JVD, supple Cardiac: RRR Respiratory: CTA GI: Soft, NT/ND MS: s/p bilateral BKA Neuro:  Grossly intact   Labs    Chemistry Recent Labs  Lab 01/22/19 1514 01/23/19 0314  NA 134* 136  K 4.3 4.1  CL 95* 98  CO2 24 21*  GLUCOSE 298* 310*  BUN 30* 38*  CREATININE 8.95* 10.12*  CALCIUM 8.6* 8.3*  PROT  --  7.2  ALBUMIN  --  3.5  AST  --  14*  ALT  --  22  ALKPHOS  --  88  BILITOT  --  1.2    GFRNONAA 6* 5*  GFRAA 7* 6*  ANIONGAP 15 17*     Hematology Recent Labs  Lab 01/22/19 1514 01/23/19 0314  WBC 6.6 6.1  RBC 3.48* 3.43*  HGB 11.0* 10.7*  HCT 33.3* 33.1*  MCV 95.7 96.5  MCH 31.6 31.2  MCHC 33.0 32.3  RDW 14.3 14.4  PLT 197 201    Cardiac Enzymes Recent Labs  Lab 01/22/19 2209 01/23/19 0314  TROPONINI 0.09* 0.08*    Recent Labs  Lab 01/22/19 1603  TROPIPOC 0.10*     Radiology    Dg Chest 2 View  Result Date: 01/22/2019 CLINICAL DATA:  Chest pain and shortness of breath EXAM: CHEST - 2 VIEW COMPARISON:  October 15, 2018 FINDINGS: Cardiomegaly. The hila and mediastinum are unremarkable. No pneumothorax. Haziness over the bases is symmetric and probably due to overlapping soft tissues. Subtle infiltrates not excluded. No other acute abnormalities. IMPRESSION: Haziness over the lung bases is symmetric and favored to be due to overlapping soft tissues. Subtle infiltrates considered less likely.  Electronically Signed   By: Dorise Bullion III M.D   On: 01/22/2019 16:05    Patient Profile     53 y.o. male with past medical history of coronary artery disease, combined systolic/diastolic congestive heart failure, atrial flutter, end-stage renal disease on dialysis, diabetes mellitus, hypertension, hyperlipidemia, prior bilateral BKA, HIV admitted with chest pain.  Last catheterization June 2019 showed an 80% mid LAD, 95% D3, 90% distal LAD, occluded first diagonal, 95% acute marginal, 95% posterior atrial branch and ejection fraction 40 to 45%.  Medical therapy recommended unless refractory angina and PCI of LAD could be considered.  Assessment & Plan    1 chest pain-patient remains pain-free.  As outlined previously he has had difficulties with recurrent chest pain and plan per Dr. [name] is to proceed with cardiac catheterization to see if LAD lesion is amenable to PCI.  Hold Coumadin.  Proceed with catheterization when INR less than 1.8.    2 atrial  flutter-patient has converted to sinus rhythm.  Continue amiodarone and beta-blocker.  Begin heparin when INR less than 2.  Resume Coumadin once all procedures complete.   3 coronary artery disease-continue statin.  Given upcoming catheterization continue aspirin 81 mg daily.  4 end-stage renal disease-dialysis per nephrology.  5 hypertension-blood pressure is controlled this morning.  Continue present medications and follow.  6 hyperlipidemia-continue Crestor.  7 history of cardiomyopathy-repeat echocardiogram pending.  Continue beta-blocker.  If LV function significantly reduced would consider hydralazine nitrates.  For questions or updates, please contact [place] Please consult [email or web address] for contact info under        Signed, Kirk Ruths, MD  01/24/2019, 8:42 AM

## 2019-01-24 NOTE — Progress Notes (Signed)
  Date: 01/24/2019  Patient name: Thomas [name]  Medical record number: [id number]  Date of birth: 08-05-1966   This patient's plan of care was discussed with the house staff. Please see their note for complete details. I concur with their findings.   Sid Falcon, MD 01/24/2019, 11:11 PM

## 2019-01-24 NOTE — Progress Notes (Signed)
[facility] Progress Note  Subjective: no sig c/o's  Vitals:   01/23/19 2300 01/24/19 0300 01/24/19 0730 01/24/19 1123  BP: 124/76 (!) 140/92 129/80 (!) 143/85  Pulse: 78 75 75 75  Resp: 20 20 (!) 29 19  Temp: 98.6 F (37 C) 98.5 F (36.9 C) 98.8 F (37.1 C) 97.7 F (36.5 C)  TempSrc: Oral Oral Oral Oral  SpO2: 98% 98%    Weight:      Height:        Inpatient medications: . amiodarone  200 mg Oral Daily  . aspirin  81 mg Oral Daily  . bictegravir-emtricitabine-tenofovir AF  1 tablet Oral Daily  . Chlorhexidine Gluconate Cloth  6 each Topical Q0600  . insulin aspart  0-15 Units Subcutaneous TID WC  . insulin aspart  0-5 Units Subcutaneous QHS  . insulin aspart  10 Units Subcutaneous TID WC  . insulin glargine  25 Units Subcutaneous QHS  . metoprolol succinate  100 mg Oral Daily  . pantoprazole  40 mg Oral BID  . ramelteon  8 mg Oral QHS  . rosuvastatin  10 mg Oral Daily    nitroGLYCERIN, polyethylene glycol  Iron/TIBC/Ferritin/ %Sat    Component Value Date/Time   IRON 47 01/15/2018 1508   TIBC 192 (L) 01/15/2018 1508   IRONPCTSAT 25 01/15/2018 1508    Exam: Gen obese pleasant AAM in no distress No jvd or bruits Chest clear bilat to bases RRR no MRG Abd soft ntnd no mass or ascites +bs GU normal male MS no joint effusion, bilat AKA Ext no LE edema Neuro is alert, Ox 3 , nf    Home meds:  - amiodarone 200 / metoprolol xl 100 qd/ rosuvastatin 10 qd  - pantoprazole 40 bid  - bictegravir-emtricitabine-tenofovir AF 1 qd  - more pending   Dialysis: [facility] (2013)  4h 36min  123kg   3K/2.25 bath  RFA AVF  500/800   Hep none  - mircera 30 ug every 4 wks  - hect 6 ug tiw     Assessment: 1. Chest pain/ known 2V CAD- planning for heart cath per cards 2. ESRD on HD TTS 3. Vol stable , at dry wt 4. Atrial fib on amio/ coumadin 5. MBD ckd - cont meds  6. Anemia ckd - get records 7. HIV 8. HTN bp's good   P: 1. Next HD 1/28      [name] MD [facility] pager [phone]   01/24/2019, 12:49 PM   Recent Labs  Lab 01/22/19 1514  01/23/19 0314 01/24/19 0710  NA 134*  --  136  --   K 4.3  --  4.1  --   CL 95*  --  98  --   CO2 24  --  21*  --   GLUCOSE 298*  --  310*  --   BUN 30*  --  38*  --   CREATININE 8.95*  --  10.12*  --   CALCIUM 8.6*  --  8.3*  --   ALBUMIN  --   --  3.5  --   INR  --    < > 3.05 2.23   < > = values in this interval not displayed.   Recent Labs  Lab 01/23/19 0314  AST 14*  ALT 22  ALKPHOS 88  BILITOT 1.2  PROT 7.2   Recent Labs  Lab 01/22/19 1514 01/23/19 0314  WBC 6.6 6.1  HGB 11.0* 10.7*  HCT 33.3*  33.1*  MCV 95.7 96.5  PLT 197 201

## 2019-01-25 ENCOUNTER — Encounter (HOSPITAL_COMMUNITY): Payer: Self-pay | Admitting: Cardiology

## 2019-01-25 ENCOUNTER — Encounter (HOSPITAL_COMMUNITY)
Admission: EM | Disposition: A | Discharge status: Home / Self Care | Payer: Self-pay | Source: Home / Self Care | Attending: Internal Medicine

## 2019-01-25 DIAGNOSIS — I081 Rheumatic disorders of both mitral and tricuspid valves: Secondary | ICD-10-CM

## 2019-01-25 DIAGNOSIS — I255 Ischemic cardiomyopathy: Secondary | ICD-10-CM

## 2019-01-25 DIAGNOSIS — I272 Pulmonary hypertension, unspecified: Secondary | ICD-10-CM

## 2019-01-25 DIAGNOSIS — I25119 Atherosclerotic heart disease of native coronary artery with unspecified angina pectoris: Secondary | ICD-10-CM

## 2019-01-25 DIAGNOSIS — I480 Paroxysmal atrial fibrillation: Principal | ICD-10-CM

## 2019-01-25 HISTORY — PX: LEFT HEART CATH AND CORONARY ANGIOGRAPHY: CATH118249

## 2019-01-25 LAB — GLUCOSE, CAPILLARY
GLUCOSE-CAPILLARY: 252 mg/dL — AB (ref 70–99)
Glucose-Capillary: 130 mg/dL — ABNORMAL HIGH (ref 70–99)
Glucose-Capillary: 146 mg/dL — ABNORMAL HIGH (ref 70–99)
Glucose-Capillary: 184 mg/dL — ABNORMAL HIGH (ref 70–99)
Glucose-Capillary: 278 mg/dL — ABNORMAL HIGH (ref 70–99)

## 2019-01-25 LAB — PROTIME-INR
INR: 1.71
Prothrombin Time: 19.9 seconds — ABNORMAL HIGH (ref 11.4–15.2)

## 2019-01-25 LAB — POCT ACTIVATED CLOTTING TIME: Activated Clotting Time: 147 seconds

## 2019-01-25 SURGERY — LEFT HEART CATH AND CORONARY ANGIOGRAPHY
Anesthesia: LOCAL

## 2019-01-25 MED ORDER — ACETAMINOPHEN 325 MG PO TABS
650.0000 mg | ORAL_TABLET | Freq: Four times a day (QID) | ORAL | Status: DC | PRN
  Administered 2019-01-25 – 2019-01-26 (×3): 650 mg via ORAL
  Filled 2019-01-25 (×3): qty 2

## 2019-01-25 MED ORDER — SODIUM CHLORIDE 0.9% FLUSH: 3.0000 mL | INTRAVENOUS | Status: DC | PRN

## 2019-01-25 MED ORDER — MIDAZOLAM HCL 2 MG/2ML IJ SOLN
INTRAMUSCULAR | Status: AC
  Filled 2019-01-25: qty 2

## 2019-01-25 MED ORDER — HEPARIN (PORCINE) 25000 UT/250ML-% IV SOLN: 1600.0000 [IU]/h | INTRAVENOUS | Status: DC

## 2019-01-25 MED ORDER — FENTANYL CITRATE (PF) 100 MCG/2ML IJ SOLN
INTRAMUSCULAR | Status: AC
  Filled 2019-01-25: qty 2

## 2019-01-25 MED ORDER — LIVING WELL WITH DIABETES BOOK
Freq: Once | Status: AC
  Administered 2019-01-25: 16:00:00
  Filled 2019-01-25: qty 1

## 2019-01-25 MED ORDER — HEPARIN (PORCINE) IN NACL 1000-0.9 UT/500ML-% IV SOLN
INTRAVENOUS | Status: AC
  Filled 2019-01-25: qty 1000

## 2019-01-25 MED ORDER — SODIUM CHLORIDE 0.9% FLUSH
3.0000 mL | Freq: Two times a day (BID) | INTRAVENOUS | Status: DC
  Administered 2019-01-25 (×2): 3 mL via INTRAVENOUS

## 2019-01-25 MED ORDER — IOHEXOL 350 MG/ML SOLN
INTRAVENOUS | Status: DC | PRN
  Administered 2019-01-25: 100 mL via INTRA_ARTERIAL

## 2019-01-25 MED ORDER — WARFARIN - PHARMACIST DOSING INPATIENT: Freq: Every day | Status: DC

## 2019-01-25 MED ORDER — HEPARIN (PORCINE) IN NACL 1000-0.9 UT/500ML-% IV SOLN
INTRAVENOUS | Status: DC | PRN
  Administered 2019-01-25 (×2): 500 mL

## 2019-01-25 MED ORDER — LIDOCAINE HCL (PF) 1 % IJ SOLN
INTRAMUSCULAR | Status: AC
  Filled 2019-01-25: qty 30

## 2019-01-25 MED ORDER — MIDAZOLAM HCL 2 MG/2ML IJ SOLN
INTRAMUSCULAR | Status: DC | PRN
  Administered 2019-01-25: 2 mg via INTRAVENOUS

## 2019-01-25 MED ORDER — LIDOCAINE HCL (PF) 1 % IJ SOLN
INTRAMUSCULAR | Status: DC | PRN
  Administered 2019-01-25: 20 mL

## 2019-01-25 MED ORDER — SODIUM CHLORIDE 0.9 % IV SOLN: 250.0000 mL | INTRAVENOUS | Status: DC | PRN

## 2019-01-25 MED ORDER — WARFARIN SODIUM 7.5 MG PO TABS
7.5000 mg | ORAL_TABLET | Freq: Once | ORAL | Status: AC
  Administered 2019-01-25: 7.5 mg via ORAL
  Filled 2019-01-25: qty 1

## 2019-01-25 MED ORDER — FENTANYL CITRATE (PF) 100 MCG/2ML IJ SOLN
INTRAMUSCULAR | Status: DC | PRN
  Administered 2019-01-25: 25 ug via INTRAVENOUS

## 2019-01-25 SURGICAL SUPPLY — 8 items
CATH INFINITI 5FR JL5 (CATHETERS) ×2 IMPLANT
CATH INFINITI 5FR MULTPACK ANG (CATHETERS) ×2 IMPLANT
KIT HEART LEFT (KITS) ×2 IMPLANT
PACK CARDIAC CATHETERIZATION (CUSTOM PROCEDURE TRAY) ×2 IMPLANT
SHEATH PINNACLE 5F 10CM (SHEATH) ×2 IMPLANT
SHEATH PROBE COVER 6X72 (BAG) ×2 IMPLANT
TRANSDUCER W/STOPCOCK (MISCELLANEOUS) ×2 IMPLANT
WIRE EMERALD 3MM-J .035X150CM (WIRE) ×2 IMPLANT

## 2019-01-25 NOTE — Plan of Care (Signed)
  Problem: Clinical Measurements: Goal: Will remain free from infection Outcome: Progressing Goal: Diagnostic test results will improve Outcome: Progressing Goal: Respiratory complications will improve Outcome: Progressing Goal: Cardiovascular complication will be avoided Outcome: Progressing   Problem: Activity: Goal: Risk for activity intolerance will decrease Outcome: Progressing   Problem: Nutrition: Goal: Adequate nutrition will be maintained Outcome: Progressing   Problem: Coping: Goal: Level of anxiety will decrease Outcome: Progressing   Problem: Elimination: Goal: Will not experience complications related to bowel motility Outcome: Progressing Goal: Will not experience complications related to urinary retention Outcome: Progressing   Problem: Pain Managment: Goal: General experience of comfort will improve Outcome: Progressing   Problem: Safety: Goal: Ability to remain free from injury will improve Outcome: Progressing   Problem: Skin Integrity: Goal: Risk for impaired skin integrity will decrease Outcome: Progressing   

## 2019-01-25 NOTE — Progress Notes (Signed)
   Subjective: No overnight events. Patient states he has not had chest pain since the morning after he came in. He said cardiology saw him and plans to do the cath around 12 pm. He has been NPO since midnight. He reports a headache this morning which he attributes to being hungry.    Objective:  Vital signs in last 24 hours: Vitals:   01/24/19 2000 01/25/19 0015 01/25/19 0230 01/25/19 0500  BP:  136/85  138/90  Pulse:  73  70  Resp: 20 16 12 19   Temp:  98.6 F (37 C)  97.8 F (36.6 C)  TempSrc:  Oral  Oral  SpO2:  94%  98%  Weight:      Height:       Gen: seen comfortably laying in bed, NAD CV: RRR, no murmurs Lungs: ctab, normal effort Ext: Bilateral BKA  Assessment/Plan:  Principal Problem:   Unstable angina (HCC) Active Problems:   Hypertensive heart disease with end stage renal disease on dialysis (HCC)   ESRD on dialysis (HCC)   Type 2 diabetes mellitus with complication (HCC)   HIV disease (HCC)   PAF (paroxysmal atrial fibrillation) (HCC)  Thomas [name] is a15 y.o. male with a medical history of CAD, combined systolic/diastolic heart failure, HTN, HLD, prior bilateral BKA, and HIV who presented with symptoms consistent with unstable angina. He was seen by an outpatient cardiologist who recommended admission to medicine service and possible repeat LHC  CAD Unstable Angina:  -Chest pain has resolved.  - Troponinand EKG reassuring, no evidence of STEMI - ECHO: Moderate global reduction in LV systolic function (EF 34-19%); severe LVH; restrictive filling; mild LVE; mild AI; mildly dilated aortic root/ascending aorta; mild MR; severe LAE; mild TR with mild pulmonary hypertension. Consistent with patient's last ECHO 09/2018 Plan - LHC with cardiology today - Continue aspirin and statin  Ischemic Cardiomyopathy Chronic HFrEF -Euvolemic - Volume status managed with HD  - Continue metoprolol  A. Fib - Continue home metoprolol -Cardiology restarted  Amiodarone - INR 1.71 this am Plan - Start heparin  -Hold warfarin until after procedures  ESRD - Last dialysis 1/25 Plan - Nephrology onboard - HD tomorrow  DM2 -CBGs within goal. Am glucose 130. Plan - Lantus 25 units QHS + 10 units Novolog TID WC + SSI  Prolonged QT - Repeat EKG shows persistent QT prolongation - Discontinue zofran  - Avoid QT prolonging medications  QQI:WLNL controlled. Continue home metoprolol  HLD:Continuerosuvastatin10mg  qd due to ESRD GXQ:[id number]$$[id number]$ controlled. Last RNA quant was undetectable. Continuebiktarvy GERD: Continue home pantoprazole 40mg  bid  ZES:PQZR, MD 01/25/2019, 6:10 AM Pager: [phone]

## 2019-01-25 NOTE — Interval H&P Note (Signed)
History and Physical Interval Note:  01/25/2019 8:17 AM  Elizabeth Sauer  has presented today for surgery, with the diagnosis of NSTEMI  The various methods of treatment have been discussed with the patient and family. After consideration of risks, benefits and other options for treatment, the patient has consented to  Procedure(s): LEFT HEART CATH AND CORONARY ANGIOGRAPHY (N/A) as a surgical intervention .  The patient's history has been reviewed, patient examined, no change in status, stable for surgery.  I have reviewed the patient's chart and labs.  Questions were answered to the patient's satisfaction.    Cath Lab Visit (complete for each Cath Lab visit)  Clinical Evaluation Leading to the Procedure:   ACS: Yes.    Non-ACS:    Anginal Classification: CCS III  Anti-ischemic medical therapy: Minimal Therapy (1 class of medications)  Non-Invasive Test Results: No non-invasive testing performed  Prior CABG: No previous CABG       Collier [facility] 01/25/2019 8:18 AM

## 2019-01-25 NOTE — Plan of Care (Signed)

## 2019-01-25 NOTE — Progress Notes (Signed)
Inpatient Diabetes Program Recommendations  AACE/ADA: New Consensus Statement on Inpatient Glycemic Control (2015)  Target Ranges:  Prepandial:   less than 140 mg/dL      Peak postprandial:   less than 180 mg/dL (1-2 hours)      Critically ill patients:  140 - 180 mg/dL   Lab Results  Component Value Date   GLUCAP 184 (H) 01/25/2019   HGBA1C 11.9 (H) 01/23/2019    Spoke with pt and wife @ bedside about A1C results 11.9 (average CBG 295 over the past 2-3 months) and explained what an A1C is, basic pathophysiology of DM Type 2, basic home care, basic diabetes diet nutrition principles, importance of checking CBGs and maintaining good CBG control to prevent long-term and short-term complications. Reviewed signs and symptoms of hyperglycemia and hypoglycemia and how to treat hypoglycemia at home. Also reviewed blood sugar goals at home.  RNs to provide ongoing basic DM education at bedside with this patient. Have ordered educational booklet and DM videos. Answered questions regarding sugary drinks,juice, and carbohydrates.  Thank you, Nani Gasser. Brylin Stanislawski, RN, MSN, CDE  Diabetes Coordinator Inpatient Glycemic Control Team Team Pager [phone] (8am-5pm) 01/25/2019 3:06 PM

## 2019-01-25 NOTE — Progress Notes (Addendum)
ANTICOAGULATION CONSULT NOTE - Initial Consult  Pharmacy Consult for warfarin Indication: atrial fibrillation  Allergies  Allergen Reactions  . Oxycodone-Acetaminophen Other (See Comments)    Hallucinations/delirum    Patient Measurements: Height: 6\' 5"  (195.6 cm) Weight: 270 lb 8.1 oz (122.7 kg) IBW/kg (Calculated) : 89.1  Vital Signs: Temp: 98 F (36.7 C) (01/27 0739) Temp Source: Oral (01/27 0739) BP: 151/87 (01/27 0955) Pulse Rate: 67 (01/27 0955)  Labs: Recent Labs    01/22/19 1514  01/22/19 2209 01/23/19 0314 01/24/19 0710 01/25/19 0247  HGB 11.0*  --   --  10.7*  --   --   HCT 33.3*  --   --  33.1*  --   --   PLT 197  --   --  201  --   --   LABPROT  --    < >  --  31.1* 24.4* 19.9*  INR  --    < >  --  3.05 2.23 1.71  CREATININE 8.95*  --   --  10.12*  --   --   TROPONINI  --   --  0.09* 0.08*  --   --    < > = values in this interval not displayed.    Estimated Creatinine Clearance: 12.4 mL/min (A) (by C-G formula based on SCr of 10.12 mg/dL (H)).   Medical History: Past Medical History:  Diagnosis Date  . Anemia   . Atrial fibrillation ([place])   . CAD (coronary artery disease), native coronary artery 01/22/2019  . Chest pain with moderate risk for cardiac etiology 06/15/2018  . Chronic combined systolic and diastolic CHF (congestive heart failure) ([place])   . Diabetic foot ulcer ([place]) 09/03/2017  . 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 ESRD (end stage renal disease) on dialysis [facility])    [facility] [address] T, [place], [place] (10/15/2018)  . Headache    "monthly" (06/15/2018)  . HIV disease ([place])   . Hyperlipidemia LDL goal <70 01/22/2019  . Hyperparathyroidism, secondary ([place])   . Hypertension   . Hypertensive heart disease with end stage renal disease on dialysis ([place]) 03/13/2017  . Mallory-Weiss tear 2018  . Pneumonia X 1  . Subacute osteomyelitis, right ankle and foot ([place])   . SVT (supraventricular tachycardia) ([place])    ? afib or atrial  flutter s/p TEE/DCCV with subsequent ablation due to reoccurrence in [place]  . Type 2 diabetes mellitus (HCC)      Assessment: 48 yoM on warfarin PTA for hx of PAF. Warfarin held for [facility], pt now to resume tonight. INR 1.71, no interventions completed in cath lab.  *Home Dose = 7.5mg  Mon/Wed/Sat, 3.75mg  all other days  Goal of Therapy:  INR 2-3 Monitor platelets by anticoagulation protocol: Yes   Plan:  -Warfarin 7.5mg  PO x1 tonight -Daily INR  Arrie Senate, PharmD, BCPS Clinical Pharmacist [phone] Please check AMION for all [facility] numbers 01/25/2019

## 2019-01-25 NOTE — Care Management Important Message (Signed)
Important Message  Patient Details  Name: Thomas [name] MRN: [id number] Date of Birth: 1966/11/30   Medicare Important Message Given:  Yes    Hudsyn Barich P Sybol Morre 01/25/2019, 12:04 PM

## 2019-01-25 NOTE — Progress Notes (Signed)
[facility] Progress Note  Subjective:  LHC this AM with MV disease, no PCI, will need ongoing med mgmt   Vitals:   01/25/19 0945 01/25/19 0950 01/25/19 0955 01/25/19 1136  BP: 118/69 130/82 (!) 151/87 137/72  Pulse: 68 66 67 72  Resp: 18 17 20 19   Temp:    98.2 F (36.8 C)  TempSrc:    Oral  SpO2: 95% 95% 94% 94%  Weight:      Height:        Inpatient medications: . amiodarone  200 mg Oral Daily  . aspirin  81 mg Oral Daily  . bictegravir-emtricitabine-tenofovir AF  1 tablet Oral Daily  . Chlorhexidine Gluconate Cloth  6 each Topical Q0600  . insulin aspart  0-15 Units Subcutaneous TID WC  . insulin aspart  0-5 Units Subcutaneous QHS  . insulin aspart  10 Units Subcutaneous TID WC  . insulin glargine  25 Units Subcutaneous QHS  . metoprolol succinate  100 mg Oral Daily  . pantoprazole  40 mg Oral BID  . ramelteon  8 mg Oral QHS  . rosuvastatin  10 mg Oral Daily  . sodium chloride flush  3 mL Intravenous Q12H  . warfarin  7.5 mg Oral ONCE-1800  . Warfarin - Pharmacist Dosing Inpatient   Does not apply q1800   . sodium chloride     sodium chloride, acetaminophen, nitroGLYCERIN, polyethylene glycol, sodium chloride flush  Iron/TIBC/Ferritin/ %Sat    Component Value Date/Time   IRON 47 01/15/2018 1508   TIBC 192 (L) 01/15/2018 1508   IRONPCTSAT 25 01/15/2018 1508    Exam: NAD Chest clear bilat to bases RRR no MRG Abd soft ntnd no mass or ascites +bs MS no joint effusion, bilat AKA Ext no LE edema Neuro is alert, Ox 3 , nf    Home meds:  - amiodarone 200 / metoprolol xl 100 qd/ rosuvastatin 10 qd  - pantoprazole 40 bid  - bictegravir-emtricitabine-tenofovir AF 1 qd  - more pending   Dialysis: [facility] (2013)  4h 46min  123kg   3K/2.25 bath  RFA AVF  500/800   Hep none  - mircera 30 ug every 4 wks  - hect 6 ug tiw     Assessment: 1. Chest pain/ known 2V CAD LHC 1/27 MV disease needs ongoing med mgmt 2. ESRD on HD [facility] 3. Vol stable , at dry wt 4. Atrial fib on amio/ coumadin 5. MBD ckd - cont meds  6. Anemia ckd - get records 7. HIV 8. HTN bp's good   P: 1. Next HD 1/28: 3K AVF 4h no heparin, Qb 450 2-3L UF as able   Pearson Grippe MD 01/25/2019, 12:46 PM   Recent Labs  Lab 01/22/19 1514  01/23/19 0314 01/24/19 0710 01/25/19 0247  NA 134*  --  136  --   --   K 4.3  --  4.1  --   --   CL 95*  --  98  --   --   CO2 24  --  21*  --   --   GLUCOSE 298*  --  310*  --   --   BUN 30*  --  38*  --   --   CREATININE 8.95*  --  10.12*  --   --   CALCIUM 8.6*  --  8.3*  --   --   ALBUMIN  --   --  3.5  --   --  INR  --    < > 3.05 2.23 1.71   < > = values in this interval not displayed.   Recent Labs  Lab 01/23/19 0314  AST 14*  ALT 22  ALKPHOS 88  BILITOT 1.2  PROT 7.2   Recent Labs  Lab 01/22/19 1514 01/23/19 0314  WBC 6.6 6.1  HGB 11.0* 10.7*  HCT 33.3* 33.1*  MCV 95.7 96.5  PLT 197 201

## 2019-01-25 NOTE — Progress Notes (Signed)
  Date: 01/25/2019  Patient name: Thomas [name]  Medical record number: [id number]  Date of birth: 1966-04-07   I have seen and evaluated this patient and I have discussed the plan of care with the house staff. Please see their note for complete details. I concur with their findings with the following additions/corrections:   53 year old man with ESRD, HFrEF, bilateral BKA, HIV, and CAD admitted with unstable angina.  Planning for left heart cath today.  Chest pain-free, only complaint this morning is being n.p.o. and having a headache.  We will follow-up on the results of the cath and cardiology recommendations.  Lenice Pressman, M.D., Ph.D. 01/25/2019, 1:36 PM

## 2019-01-25 NOTE — Progress Notes (Signed)
Site area: Right groin a 5 french arterial sheath was removed by Hyiu Ksor RN   Site Prior to Removal:  Level 0  Pressure Applied For 20 MINUTES    Bedrest Beginning at 1000am  Manual:   Yes.    Patient Status During Pull:  stable  Post Pull Groin Site:  Level 0  Post Pull Instructions Given:  Yes.    Post Pull Pulses Present:  Yes.    Dressing Applied:  Yes.    Comments:  VS remain stable

## 2019-01-25 NOTE — Progress Notes (Addendum)
Pt returned from cath lab was advise bedrest until 1300. Patient was advise to lay flat could not setup due to R groin site.  Patient noncompliant to instructions elevated be to semi fowlers stated could not lay flat. Patient seen sitting up in bed. RN advise patient to must lie down to prevent possible bleed or hematoma in groin.  1120- Pt sitting in semi fowlers position eating lunch.  RN will continue to monitor.

## 2019-01-25 NOTE — Progress Notes (Signed)
ANTICOAGULATION CONSULT NOTE - Initial Consult  Pharmacy Consult for heparin Indication: USAP and Afib  Allergies  Allergen Reactions  . Oxycodone-Acetaminophen Other (See Comments)    Hallucinations/delirum    Patient Measurements: Height: 6\' 5"  (195.6 cm) Weight: 270 lb 8.1 oz (122.7 kg) IBW/kg (Calculated) : 89.1 Heparin Dosing Weight: 115kg  Vital Signs: Temp: 97.8 F (36.6 C) (01/27 0500) Temp Source: Oral (01/27 0500) BP: 138/90 (01/27 0500) Pulse Rate: 70 (01/27 0500)  Labs: Recent Labs    01/22/19 1514  01/22/19 2209 01/23/19 0314 01/24/19 0710 01/25/19 0247  HGB 11.0*  --   --  10.7*  --   --   HCT 33.3*  --   --  33.1*  --   --   PLT 197  --   --  201  --   --   LABPROT  --    < >  --  31.1* 24.4* 19.9*  INR  --    < >  --  3.05 2.23 1.71  CREATININE 8.95*  --   --  10.12*  --   --   TROPONINI  --   --  0.09* 0.08*  --   --    < > = values in this interval not displayed.    Estimated Creatinine Clearance: 12.4 mL/min (A) (by C-G formula based on SCr of 10.12 mg/dL (H)).   Medical History: Past Medical History:  Diagnosis Date  . Anemia   . Atrial fibrillation ([place])   . CAD (coronary artery disease), native coronary artery 01/22/2019  . Chest pain with moderate risk for cardiac etiology 06/15/2018  . Chronic combined systolic and diastolic CHF (congestive heart failure) ([place])   . Diabetic foot ulcer ([place]) 09/03/2017  . 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 ESRD (end stage renal disease) on dialysis [facility])    [facility] [address] T, [place], [place] (10/15/2018)  . Headache    "monthly" (06/15/2018)  . HIV disease ([place])   . Hyperlipidemia LDL goal <70 01/22/2019  . Hyperparathyroidism, secondary ([place])   . Hypertension   . Hypertensive heart disease with end stage renal disease on dialysis ([place]) 03/13/2017  . Mallory-Weiss tear 2018  . Pneumonia X 1  . Subacute osteomyelitis, right ankle and foot ([place])   . SVT (supraventricular tachycardia) ([place])     ? afib or atrial flutter s/p TEE/DCCV with subsequent ablation due to reoccurrence in [place]  . Type 2 diabetes mellitus (HCC)     Medications:  Medications Prior to Admission  Medication Sig Dispense Refill Last Dose  . acetaminophen (TYLENOL) 325 MG tablet Take 650 mg by mouth every 6 (six) hours as needed (for headaches).    unk at [employer]  . amiodarone (PACERONE) 200 MG tablet Take 1 tablet (200 mg total) by mouth daily. (Patient taking differently: Take 200 mg by mouth at bedtime. ) 30 tablet 11 01/21/2019 at pm  . bictegravir-emtricitabine-tenofovir AF (BIKTARVY) 50-200-25 MG TABS tablet Take 1 tablet by mouth daily. 30 tablet 11 01/21/2019 at Unknown time  . camphor-menthol (SARNA) lotion Apply topically as needed for itching. 222 mL 0 unk at unk  . cinacalcet (SENSIPAR) 90 MG tablet Take 90 mg by mouth daily.  6 01/22/2019 at Unknown time  . ferric citrate (AURYXIA) 1 GM 210 MG(Fe) tablet Take 420-840 mg by mouth See admin instructions. Take 840 mg by mouth three times a day after meals and 420 mg after each snack   01/22/2019 at Unknown  time  . gabapentin (NEURONTIN) 100 MG capsule Take 1 capsule (100 mg total) by mouth every dialysis. 15 capsule 1 01/20/2019 at Unknown time  . insulin glargine (LANTUS) 100 unit/mL SOPN Inject 0.3 mLs (30 Units total) into the skin at bedtime. (Patient taking differently: Inject 20 Units into the skin at bedtime. ) 15 mL 11 01/21/2019 at pm  . insulin lispro (HUMALOG) 100 UNIT/ML KiwkPen Please inject 10 units, twice daily, ~10 min before your two largest meals of the day. (Patient taking differently: Inject 10 Units into the skin See admin instructions. Inject 10 units into the skin two times a day- 10 minutes before the 2 largest meals of the day) 15 mL 11 01/21/2019 at Unknown time  . linagliptin (TRADJENTA) 5 MG TABS tablet Take 1 tablet (5 mg total) by mouth daily. 90 tablet 1 01/21/2019 at Unknown time  . metoprolol succinate (TOPROL-XL) 100 MG 24 hr tablet TAKE  1 TABLET BY MOUTH ONCE DAILY TAKE WITH OR IMMEDIATELY FOLLOWING A MEAL (Patient taking differently: Take 100 mg by mouth daily. TAKE WITH OR IMMEDIATELY FOLLOWING A MEAL) 90 tablet 1 01/21/2019 at 1700  . Naproxen Sodium (ALEVE PO) Take 1-2 capsules by mouth every 8 (eight) hours as needed (for headaches or pain).   unk at [employer]  . pantoprazole (PROTONIX) 40 MG tablet Take 2 tablets (80 mg total) by mouth daily. (Patient taking differently: Take 40 mg by mouth 2 (two) times daily. ) 60 tablet 1 01/22/2019 at am  . polycarbophil (FIBERCON) 625 MG tablet Take 1 tablet (625 mg total) by mouth daily. 30 tablet 0 01/22/2019 at Unknown time  . rosuvastatin (CRESTOR) 20 MG tablet TAKE 1 TABLET BY MOUTH ONCE DAILY AT 6PM (Patient taking differently: Take 20 mg by mouth daily. ) 90 tablet 1 01/21/2019 at pm  . warfarin (COUMADIN) 7.5 MG tablet TAKE 1/2 TABLET BY MOUTH DAILY, EXCEPT 1 TABLET ON Monday, Wednesday, AND Saturday OR AS DIRECTED BY ANTICOAGULATION CLINIC. (Patient taking differently: Take 3.75-7.5 mg by mouth at bedtime. Take 3.75 mg by mouth at bedtime on Sun/Tues/Thurs/Fri and 7.5 mg on Mon/Wed/Sat) 90 tablet 0 01/21/2019 at 2100  . ferric gluconate 125 mg in sodium chloride 0.9 % 100 mL Inject 125 mg into the vein Every Tuesday,Thursday,and Saturday with dialysis. (Patient not taking: Reported on 01/22/2019)   Not Taking at Unknown time   Scheduled:  . amiodarone  200 mg Oral Daily  . aspirin  81 mg Oral Daily  . aspirin  81 mg Oral Pre-Cath  . bictegravir-emtricitabine-tenofovir AF  1 tablet Oral Daily  . Chlorhexidine Gluconate Cloth  6 each Topical Q0600  . insulin aspart  0-15 Units Subcutaneous TID WC  . insulin aspart  0-5 Units Subcutaneous QHS  . insulin aspart  10 Units Subcutaneous TID WC  . insulin glargine  25 Units Subcutaneous QHS  . metoprolol succinate  100 mg Oral Daily  . pantoprazole  40 mg Oral BID  . ramelteon  8 mg Oral QHS  . rosuvastatin  10 mg Oral Daily  . sodium chloride  flush  3 mL Intravenous Q12H   Infusions:  . sodium chloride    . sodium chloride 10 mL/hr at 01/25/19 0037    Assessment: 53yo male admitted w/ USAP, awaiting LHC, INR now <2, to begin heparin bridge.  Goal of Therapy:  Heparin level 0.3-0.7 units/ml Monitor platelets by anticoagulation protocol: Yes   Plan:  Will begin heparin gtt at 1600 units/hr and monitor heparin level  and CBC.  Wynona Neat, PharmD, BCPS  01/25/2019,6:22 AM

## 2019-01-26 ENCOUNTER — Other Ambulatory Visit: Payer: Self-pay | Admitting: Physician Assistant

## 2019-01-26 ENCOUNTER — Telehealth: Payer: Self-pay

## 2019-01-26 DIAGNOSIS — I1311 Hypertensive heart and chronic kidney disease without heart failure, with stage 5 chronic kidney disease, or end stage renal disease: Secondary | ICD-10-CM

## 2019-01-26 DIAGNOSIS — I214 Non-ST elevation (NSTEMI) myocardial infarction: Secondary | ICD-10-CM

## 2019-01-26 DIAGNOSIS — Z992 Dependence on renal dialysis: Secondary | ICD-10-CM

## 2019-01-26 DIAGNOSIS — E785 Hyperlipidemia, unspecified: Secondary | ICD-10-CM

## 2019-01-26 DIAGNOSIS — N186 End stage renal disease: Secondary | ICD-10-CM

## 2019-01-26 DIAGNOSIS — I5042 Chronic combined systolic (congestive) and diastolic (congestive) heart failure: Secondary | ICD-10-CM

## 2019-01-26 DIAGNOSIS — I484 Atypical atrial flutter: Secondary | ICD-10-CM

## 2019-01-26 DIAGNOSIS — R079 Chest pain, unspecified: Secondary | ICD-10-CM

## 2019-01-26 LAB — GLUCOSE, CAPILLARY: Glucose-Capillary: 176 mg/dL — ABNORMAL HIGH (ref 70–99)

## 2019-01-26 LAB — RENAL FUNCTION PANEL
Albumin: 3.2 g/dL — ABNORMAL LOW (ref 3.5–5.0)
Anion gap: 18 — ABNORMAL HIGH (ref 5–15)
BUN: 68 mg/dL — ABNORMAL HIGH (ref 6–20)
CHLORIDE: 94 mmol/L — AB (ref 98–111)
CO2: 21 mmol/L — ABNORMAL LOW (ref 22–32)
Calcium: 8.5 mg/dL — ABNORMAL LOW (ref 8.9–10.3)
Creatinine, Ser: 11.94 mg/dL — ABNORMAL HIGH (ref 0.61–1.24)
GFR calc Af Amer: 5 mL/min — ABNORMAL LOW (ref >60)
GFR calc non Af Amer: 4 mL/min — ABNORMAL LOW (ref >60)
Glucose, Bld: 228 mg/dL — ABNORMAL HIGH (ref 70–99)
Phosphorus: 5.9 mg/dL — ABNORMAL HIGH (ref 2.5–4.6)
Potassium: 4.5 mmol/L (ref 3.5–5.1)
Sodium: 133 mmol/L — ABNORMAL LOW (ref 135–145)

## 2019-01-26 LAB — CBC
HEMATOCRIT: 28.6 % — AB (ref 39.0–52.0)
Hemoglobin: 9.2 g/dL — ABNORMAL LOW (ref 13.0–17.0)
MCH: 30.8 pg (ref 26.0–34.0)
MCHC: 32.2 g/dL (ref 30.0–36.0)
MCV: 95.7 fL (ref 80.0–100.0)
Platelets: 157 10*3/uL (ref 150–400)
RBC: 2.99 MIL/uL — ABNORMAL LOW (ref 4.22–5.81)
RDW: 14.6 % (ref 11.5–15.5)
WBC: 7.6 10*3/uL (ref 4.0–10.5)
nRBC: 0 % (ref 0.0–0.2)

## 2019-01-26 LAB — PROTIME-INR
INR: 1.65
Prothrombin Time: 19.3 seconds — ABNORMAL HIGH (ref 11.4–15.2)

## 2019-01-26 MED ORDER — ASPIRIN 81 MG PO CHEW: 81.0000 mg | CHEWABLE_TABLET | Freq: Every day | ORAL | 1 refills | Status: AC

## 2019-01-26 MED ORDER — ACETAMINOPHEN 325 MG PO TABS
ORAL_TABLET | ORAL | Status: AC
  Filled 2019-01-26: qty 2

## 2019-01-26 NOTE — Procedures (Signed)
I was present at this dialysis session. I have reviewed the session itself and made appropriate changes.   3L UF goal, 115/73.  Qb 400.  C/o some dyspnea overnight.  Inc UF goal to 4L to start with.  3K bath.    If dysnpea resolves with HD, ok for DC from renal perspective.  Filed Weights   01/23/19 1530 01/26/19 0300 01/26/19 0710  Weight: 122.7 kg 130.5 kg 128.4 kg    Recent Labs  Lab 01/23/19 0314  NA 136  K 4.1  CL 98  CO2 21*  GLUCOSE 310*  BUN 38*  CREATININE 10.12*  CALCIUM 8.3*    Recent Labs  Lab 01/22/19 1514 01/23/19 0314  WBC 6.6 6.1  HGB 11.0* 10.7*  HCT 33.3* 33.1*  MCV 95.7 96.5  PLT 197 201    Scheduled Meds: . amiodarone  200 mg Oral Daily  . aspirin  81 mg Oral Daily  . bictegravir-emtricitabine-tenofovir AF  1 tablet Oral Daily  . Chlorhexidine Gluconate Cloth  6 each Topical Q0600  . insulin aspart  0-15 Units Subcutaneous TID WC  . insulin aspart  0-5 Units Subcutaneous QHS  . insulin aspart  10 Units Subcutaneous TID WC  . insulin glargine  25 Units Subcutaneous QHS  . metoprolol succinate  100 mg Oral Daily  . pantoprazole  40 mg Oral BID  . ramelteon  8 mg Oral QHS  . rosuvastatin  10 mg Oral Daily  . sodium chloride flush  3 mL Intravenous Q12H  . Warfarin - Pharmacist Dosing Inpatient   Does not apply q1800   Continuous Infusions: . sodium chloride     PRN Meds:.sodium chloride, acetaminophen, nitroGLYCERIN, polyethylene glycol, sodium chloride flush   Pearson Grippe  MD 01/26/2019, 7:52 AM

## 2019-01-26 NOTE — Telephone Encounter (Signed)
Hospital TOC per Dr Tarri Abernethy, discharge 01/26/19, appt 02/04/19.

## 2019-01-26 NOTE — Discharge Summary (Signed)
Name: Thomas [name] MRN: [id number] DOB: 18-Apr-1966 53 y.o. PCP: [name], MD  Date of Admission: 01/22/2019  2:53 PM Date of Discharge: 01/26/2019 Attending Physician: Oda Kilts, MD  Discharge Diagnosis:  Chest Pain secondary to Paroxysmal Atrial Fibrillation   Discharge Medications: Allergies as of 01/26/2019      Reactions   Oxycodone-acetaminophen Other (See Comments)   Hallucinations/delirum      Medication List    STOP taking these medications   ALEVE PO     TAKE these medications   acetaminophen 325 MG tablet Commonly known as:  TYLENOL Take 650 mg by mouth every 6 (six) hours as needed (for headaches).   amiodarone 200 MG tablet Commonly known as:  PACERONE Take 1 tablet (200 mg total) by mouth daily. What changed:  when to take this   aspirin 81 MG chewable tablet Chew 1 tablet (81 mg total) by mouth daily.   AURYXIA 1 GM 210 MG(Fe) tablet Generic drug:  ferric citrate Take 420-840 mg by mouth See admin instructions. Take 840 mg by mouth three times a day after meals and 420 mg after each snack   bictegravir-emtricitabine-tenofovir AF 50-200-25 MG Tabs tablet Commonly known as:  BIKTARVY Take 1 tablet by mouth daily.   camphor-menthol lotion Commonly known as:  SARNA Apply topically as needed for itching.   cinacalcet 90 MG tablet Commonly known as:  SENSIPAR Take 90 mg by mouth daily.   ferric gluconate 125 mg in sodium chloride 0.9 % 100 mL Inject 125 mg into the vein Every Tuesday,Thursday,and Saturday with dialysis.   gabapentin 100 MG capsule Commonly known as:  NEURONTIN Take 1 capsule (100 mg total) by mouth every dialysis.   insulin glargine 100 unit/mL Sopn Commonly known as:  LANTUS Inject 0.3 mLs (30 Units total) into the skin at bedtime. What changed:  how much to take   insulin lispro 100 UNIT/ML KiwkPen Commonly known as:  HUMALOG Please inject 10 units, twice daily, ~10 min before your two largest meals of the  day. What changed:    how much to take  how to take this  when to take this  additional instructions   linagliptin 5 MG Tabs tablet Commonly known as:  TRADJENTA Take 1 tablet (5 mg total) by mouth daily.   metoprolol succinate 100 MG 24 hr tablet Commonly known as:  TOPROL-XL TAKE 1 TABLET BY MOUTH ONCE DAILY TAKE WITH OR IMMEDIATELY FOLLOWING A MEAL What changed:    how much to take  how to take this  when to take this  additional instructions   pantoprazole 40 MG tablet Commonly known as:  PROTONIX Take 2 tablets (80 mg total) by mouth daily. What changed:    how much to take  when to take this   polycarbophil 625 MG tablet Commonly known as:  FIBERCON Take 1 tablet (625 mg total) by mouth daily.   rosuvastatin 20 MG tablet Commonly known as:  CRESTOR TAKE 1 TABLET BY MOUTH ONCE DAILY AT 6PM What changed:  See the new instructions.   warfarin 7.5 MG tablet Commonly known as:  COUMADIN Take as directed. If you are unsure how to take this medication, talk to your nurse or doctor. Original instructions:  TAKE 1/2 TABLET BY MOUTH DAILY, EXCEPT 1 TABLET ON Monday, Wednesday, AND Saturday OR AS DIRECTED BY ANTICOAGULATION CLINIC. What changed:    how much to take  how to take this  when to take this  additional instructions  Disposition and follow-up:   [name] was discharged from [facility] in Stable condition.  At the hospital follow up visit please address:  1.  Chest Pain. Discuss the initiation of ARB. Continue to work on risk factor modification including diabetes and BP control.   2.  Labs / imaging needed at time of follow-up: None  3.  Pending labs/ test needing follow-up: None  Follow-up Appointments: Follow-up Information    [name], MD Follow up on 02/04/2019.   Specialty:  Internal Medicine Why:  @ 2:15pm  Contact information: [place] [id number] [phone]        Sueanne Margarita, MD .   Specialty:  Cardiology Contact information: [address] [place]  [address] [place] Hospital Course by problem list:  Chest Pain secondary to Paroxysmal Atrial Fibrillation. 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 He wasseen by anoutpatient cardiologist who recommended admission to medicine service and possible repeat LHC. With admission his PAF spontaneously converted back to sinus and his chest pain resolved. He was monitored and underwent LHC on 01/25/2018 that illustrated no significant changes compared to June 2019 and cardiology felt the risk of intervention outweighed the benefit. Therefore medical management was recommended. Cariology recommended discharge on ASA 81 mg QD, Toprol XL 100 mg QD, Amiodarone 200 mg QD, Warfarin, and Crestor 40 mg QD. His Crestor was not increased do to his ESRD but all other medications were changed accordingly. It is recommended that he be on an ARB but we will defer this decision to nephrology. He was discharge in stable condition.   Discharge Vitals:   BP (!) 145/92   Pulse 77   Temp 98.5 F (36.9 C) (Oral)   Resp 20   Ht 6\' 5"  (1.956 m)   Wt 128.4 kg   SpO2 98%   BMI 33.57 kg/m   Pertinent Labs, Studies, and Procedures:   Left Heart Cath 01/25/2019  1. Severe 2 vessel obstructive CAD    - 80% mid LAD    - 99% distal LAD and third diagonal    - 100% first diagonal    - 100% first PLOM, 95% ongoing PL branch    - 90% RV marginal branch. 2. Mildly elevated LVEDP 24 mm Hg.  Plan: Compared to June 2019 there is no significant change. The only lesion that is amenable to PCI is the mid LAD. This would be a complex PCI with need for atherectomy and stenting due to severe calcification.  It would also require DAPT as well  as coumadin increasing bleeding risk. As noted in prior cath note I don't think treatment of this lesion would add to his overall prognosis. The patient has only had 2 episodes of angina since last June. One occurred last October when he became hypotensive during dialysis. His recent symptoms are clearly related to development of atrial flutter and  his symptoms resolved with restoration of NSR. This would also argue for continued medical management.   Discharge Instructions: Discharge Instructions    Diet - low sodium heart healthy   Complete by:  As directed    Increase activity slowly   Complete by:  As directed     Signed: [name], MD 01/26/2019, 10:44 AM

## 2019-01-26 NOTE — Progress Notes (Signed)
   Subjective: No overnight events. Thomas [name] is seen in HD this morning. He reports that he was feeling short of breath before dialysis and now feels much better since 1L of fluid has been removed already during his session. He denies any chest pain. He reports soreness at the point of entry of his LHC procedure, but denies any bleeding. All of his questions were addressed.  Objective:  Vital signs in last 24 hours: Vitals:   01/25/19 1623 01/25/19 1900 01/25/19 2300 01/26/19 0300  BP: 128/89 (!) 144/82 (!) 145/81 (!) 152/91  Pulse:  76 81 79  Resp: 19 (!) 24 17 (!) 29  Temp: (!) 97.3 F (36.3 C) 98.4 F (36.9 C) 98.4 F (36.9 C) 98.4 F (36.9 C)  TempSrc: Oral Oral Oral Oral  SpO2: 98% 98% 98% 98%  Weight:    130.5 kg  Height:       Gen: lying comfortably in bed in HD, no distress Pulm: normal effort on 2L [place] Ext: bilateral BKA  Assessment/Plan:  Principal Problem:   Unstable angina (HCC) Active Problems:   Hypertensive heart disease with end stage renal disease on dialysis (HCC)   ESRD on dialysis (HCC)   Type 2 diabetes mellitus with complication (HCC)   HIV disease (HCC)   PAF (paroxysmal atrial fibrillation) (HCC)   Atrial flutter (HCC)  Thomas [name] is a64 y.o. malewith a medical history of CAD, combined systolic/diastolic heart failure, HTN, HLD, prior bilateral BKA, and HIV who presented with symptomsconsistent with unstable angina. He wasseen by anoutpatient cardiologist who recommended admission to medicine service and possible repeat LHC  CAD Unstable Angina  -Chest pain has resolved.  - Troponinand EKG reassuring, no evidence of STEMI - ECHO: Moderate global reduction in LV systolic function (EF 57-32%); severe LVH; restrictive filling; mild LVE; mild AI; mildly dilated aortic root/ascending aorta; mild MR; severe LAE; mild TR with mild pulmonary hypertension. Consistent with patient's last ECHO 09/2018 - LHC 1/27: severe two vessel obstructive CAD  (80% mid LAD, 99% distal LAD and third diagonal, 100% first diagonal, 100% first PLOM, 95% ongoing PL branch, 90% RV marginal branch). Cardiology did not stent and recommended continued medical management. Plan - Continue aspirin, statin, and metoprolol  Ischemic Cardiomyopathy Chronic HFrEF -Euvolemic - Volume status managed with HD  - Continue metoprolol - Cards recommends low dose ARB, but will defer to nephrology given hypotension  A. Flutter - Continue home metoprolol - Continue amiodarone -Resumed warfarin yesterday, INR 1.65 this am  ESRD -Last dialysis 1/25 Plan - Nephrology onboard - HD today  DM2 -CBGs above goal. Patient met with inpatient diabetes coordinator for counseling. Discharge home today with PCP f/u for ongoing management. Plan -Lantus 25 unitsQHS + 10units Novolog TID WC + SSI  Prolonged QT -Repeat EKG shows persistent QT prolongation - Discontinue zofran  -Avoid QT prolonging medications  KGU:RKYH controlled. Continue home metoprolol  CWC:[id number] qd. No dose increase given ESRD. WNI:OEVO controlled. Last RNA quant was undetectable. Continuebiktarvy GERD:Continue home pantoprazole 61m bid  Dispo: Anticipated discharge today  Dorrell, DAndree Elk MD 01/26/2019, 6:13 AM Pager: [phone]

## 2019-01-26 NOTE — Progress Notes (Signed)
Back from Hemodialysis by bed awake and alert. 

## 2019-01-26 NOTE — Discharge Instructions (Signed)
Thank you for allowing [place] to provide your care. We have not made any medication changes. You have follow-up scheduled with your primary care doctor and your cardiologist in the coming weeks.

## 2019-01-26 NOTE — Progress Notes (Signed)
Progress Note  Patient Name: Thomas [name] Date of Encounter: 01/26/2019  Primary Cardiologist: Fransico Him, MD   Subjective   Denies any further CP or SOB.  Remains in NSR.  Inpatient Medications    Scheduled Meds: . amiodarone  200 mg Oral Daily  . aspirin  81 mg Oral Daily  . bictegravir-emtricitabine-tenofovir AF  1 tablet Oral Daily  . Chlorhexidine Gluconate Cloth  6 each Topical Q0600  . insulin aspart  0-15 Units Subcutaneous TID WC  . insulin aspart  0-5 Units Subcutaneous QHS  . insulin aspart  10 Units Subcutaneous TID WC  . insulin glargine  25 Units Subcutaneous QHS  . metoprolol succinate  100 mg Oral Daily  . pantoprazole  40 mg Oral BID  . ramelteon  8 mg Oral QHS  . rosuvastatin  10 mg Oral Daily  . sodium chloride flush  3 mL Intravenous Q12H  . Warfarin - Pharmacist Dosing Inpatient   Does not apply q1800   Continuous Infusions: . sodium chloride     PRN Meds: sodium chloride, acetaminophen, nitroGLYCERIN, polyethylene glycol, sodium chloride flush   Vital Signs    Vitals:   01/26/19 0730 01/26/19 0800 01/26/19 0830 01/26/19 0900  BP: 115/73 (!) 147/93 128/60 (!) 137/94  Pulse: 84 83 78 83  Resp:      Temp:      TempSrc:      SpO2:      Weight:      Height:        Intake/Output Summary (Last 24 hours) at 01/26/2019 0919 Last data filed at 01/25/2019 2202 Gross per 24 hour  Intake 483 ml  Output -  Net 483 ml   Filed Weights   01/23/19 1530 01/26/19 0300 01/26/19 0710  Weight: 122.7 kg 130.5 kg 128.4 kg    Telemetry    NSR - Personally Reviewed  ECG    NSR with prolonged QT at 580msec. - Personally Reviewed  Physical Exam   GEN: No acute distress.   Neck: No JVD Cardiac: RRR, no murmurs, rubs, or gallops.  Respiratory: Clear to auscultation bilaterally. GI: Soft, nontender, non-distended  MS: No edema; No deformity. Neuro:  Nonfocal  Psych: Normal affect   Labs    Chemistry Recent Labs  Lab 01/22/19 1514  01/23/19 0314 01/26/19 0700  NA 134* 136 133*  K 4.3 4.1 4.5  CL 95* 98 94*  CO2 24 21* 21*  GLUCOSE 298* 310* 228*  BUN 30* 38* 68*  CREATININE 8.95* 10.12* 11.94*  CALCIUM 8.6* 8.3* 8.5*  PROT  --  7.2  --   ALBUMIN  --  3.5 3.2*  AST  --  14*  --   ALT  --  22  --   ALKPHOS  --  88  --   BILITOT  --  1.2  --   GFRNONAA 6* 5* 4*  GFRAA 7* 6* 5*  ANIONGAP 15 17* 18*     Hematology Recent Labs  Lab 01/22/19 1514 01/23/19 0314 01/26/19 0700  WBC 6.6 6.1 7.6  RBC 3.48* 3.43* 2.99*  HGB 11.0* 10.7* 9.2*  HCT 33.3* 33.1* 28.6*  MCV 95.7 96.5 95.7  MCH 31.6 31.2 30.8  MCHC 33.0 32.3 32.2  RDW 14.3 14.4 14.6  PLT 197 201 157    Cardiac Enzymes Recent Labs  Lab 01/22/19 2209 01/23/19 0314  TROPONINI 0.09* 0.08*    Recent Labs  Lab 01/22/19 1603  TROPIPOC 0.10*     BNPNo results  for input(s): BNP, PROBNP in the last 168 hours.   DDimer No results for input(s): DDIMER in the last 168 hours.   Radiology    No results found.  Cardiac Studies   2D echo Study Conclusions  - Left ventricle: The cavity size was mildly dilated. Wall   thickness was increased in a pattern of severe LVH. Systolic   function was moderately reduced. The estimated ejection fraction   was in the range of 35% to 40%. Diffuse hypokinesis. Doppler   parameters are consistent with restrictive physiology, indicative   of decreased left ventricular diastolic compliance and/or   increased left atrial pressure. Doppler parameters are consistent   with high ventricular filling pressure. - Aortic valve: There was mild regurgitation. - Aortic root: The aortic root was mildly dilated. - Ascending aorta: The ascending aorta was mildly dilated. - Mitral valve: Calcified annulus. There was mild regurgitation. - Left atrium: The atrium was severely dilated. - Pulmonary arteries: Systolic pressure was mildly increased. PA   peak pressure: 42 mm Hg (S).  Impressions:  - Moderate global  reduction in LV systolic function (EF 40); severe   LVH; restrictive filling; mild LVE; mild AI; mildly dilated   aortic root/ascending aorta; mild MR; severe LAE; mild TR with   mild pulmonary hypertension.  Cardiac Cath  Prox LAD lesion is 30% stenosed.  Ost 3rd Diag lesion is 95% stenosed.  Mid LAD lesion is 80% stenosed.  Dist LAD lesion is 99% stenosed.  Ost 1st Diag lesion is 100% stenosed.  Acute Mrg lesion is 95% stenosed.  1st RPLB lesion is 100% stenosed.  Post Atrio lesion is 95% stenosed.  LV end diastolic pressure is mildly elevated.   1. Severe 2 vessel obstructive CAD    - 80% mid LAD    - 99% distal LAD and third diagonal    - 100% first diagonal    - 100% first PLOM, 95% ongoing PL branch    - 90% RV marginal branch. 2. Mildly elevated LVEDP 24 mm Hg.  Plan: Compared to June 2019 there is no significant change. The only lesion that is amenable to PCI is the mid LAD. This would be a complex PCI with need for atherectomy and stenting due to severe calcification.  It would also require DAPT as well as coumadin increasing bleeding risk. As noted in prior cath note I don't think treatment of this lesion would add to his overall prognosis. The patient has only had 2 episodes of angina since last June. One occurred last October when he became hypotensive during dialysis. His recent symptoms are clearly related to development of atrial flutter and  his symptoms resolved with restoration of NSR. This would also argue for continued medical management.    Patient Profile     53 y.o. male with Ischemic DCM, ASCAD, dilated aorta, hyperlipidemia and chronic systolic CHF admitted with atrial flutter and CP.  Assessment & Plan    1. Chest pain  -likely related to sudden onset of atrial flutter -trop minimally elevated at 0.1>0.09>0.08 -cath unchanged from prior study with severe 2v CAD with 80% mLAD, 99% dLAD and D# and occluded D1/PLOM and 95% PL, 90% RV  marginal. -the only lesion amenable to PCI is the mLAD which would be complex PCI with atherectomy and stent and would require DAPT and coumadin increasing bleeding risk.   -Treatment of LAD would likely not add to overall prognosis and has only had CP twice in past 9 months (once  on HD in setting of hypotension and the other in setting of aflutter). -continue medical management with ASA 81mg  daily, statin and BB.  2. ASCAD  -see above #1 explanation of cath -Continue Toprol-XL 100 mg daily, Aspirin 81 mg daily  -Crestor increased to 40mg  daily  3. PAFlutter -converted back to NSR which has been maintaining since conversion -continue warfarin, BB and Amio -will have him seen in afib clinic  4. Hypertension  -BP fairly well controlled on exam today.  BP 137/49mmHg but normal yesterday -He will continue on Toprol-XL 100 mg daily.   5. Ischemic dilated cardiomyopathy  -repeat echo this admission showed severe LVH with DCM EF 35-40% and restrictive physiology  -continue BB -he has had problems with hypotension on HD int he past but would like renal input as to whether he would tolerate low dose ARB for his LV dysfunction  6. Chronic systolic heart failure  -He will continue on Toprol-XL 100 mg daily.  -He is not on an ACEI/ARB/ARNI due to end-stage renal disease and history of hypotension during dialysis.  -His volume is managed by dialysis.  -Would appreciate renal input on whether he would be a candidate for low dose ARB  7. Ascending aortic aneurysm  -has hx of ascending aortic aneurysm at 43 mmHg.  -repeat echo this admit showed stable ascending aortic aneurysm at 24mm -Continue statin therapy and good BP control  8. Hyperlipidemia with LDL goal less than 70  - his LDL was 62 on 03/10/2017 but then could not be calculated on repeat 06/18/2018 where his triglycerides were 596 and LDL could not be calculated..  - repeat lipids this admit with severe  hypertryglyceridemia -increased Crestor to 40 mg daily.  -will need followup in lipid clinic.  I have spent a total of 40 minutes with patient reviewing 2D echo, cardiac cath , telemetry, EKGs, labs and examining patient as well as establishing an assessment and plan that was discussed with the patient.  > 50% of time was spent in direct patient care.    [facility] will sign off.   Medication Recommendations:  Crestor 40mg  daily, ASA 81mg  daily, Toprol XL 100mg  daily, Amiodarone 200mg  daily and warfarin per pharmacy.  Consider addition of low dose ARB if renal ok and feel that his BP will tolerate during HD. Other recommendations (labs, testing, etc):  FLP and ALT in 6 weeks Follow up as an outpatient:  Lipid clinic outpt, afib clinic, TOC with extender in 7-10 days  For questions or updates, please contact [place] Please consult [email or web address] for contact info under Cardiology/STEMI.      Signed, Fransico Him, MD  01/26/2019, 9:19 AM

## 2019-01-26 NOTE — Progress Notes (Signed)
Discharged home accompanied by wife, discharge instructions given to pt. Belongings taken home. 

## 2019-01-27 ENCOUNTER — Telehealth: Payer: Self-pay

## 2019-01-27 DIAGNOSIS — E782 Mixed hyperlipidemia: Secondary | ICD-10-CM

## 2019-01-27 DIAGNOSIS — E785 Hyperlipidemia, unspecified: Secondary | ICD-10-CM

## 2019-01-27 NOTE — Telephone Encounter (Signed)
-----   Message from Rivka Barbara sent at 01/27/2019  9:07 AM EST ----- Regarding: FW: Lipid Clinic  ----- Message ----- From: Reola Mosher Sent: 01/26/2019   5:46 PM EST To: Charlie Pitter, PA-C, Lonn Georgia, PA-C, # Subject: Lipid Clinic                                   He needs a referral to the Lipid Clinic plus a recheck on his lipids and liver functions in 6 weeks (ordered). Pt of Dr [name], already has f/u with Dayna in 2 weeks. I have also sent a message to get him an appt in the Afib clinic.   Thanks

## 2019-01-27 NOTE — Telephone Encounter (Signed)
Lipid clinic referral ordered.

## 2019-01-27 NOTE — Addendum Note (Signed)
Addended by: Patterson Hammersmith A on: 01/27/2019 11:25 AM   Modules accepted: Orders

## 2019-01-28 ENCOUNTER — Other Ambulatory Visit: Payer: Self-pay

## 2019-01-28 ENCOUNTER — Emergency Department (HOSPITAL_COMMUNITY): Payer: Medicare Other

## 2019-01-28 ENCOUNTER — Observation Stay (HOSPITAL_COMMUNITY)
Admission: EM | Admit: 2019-01-28 | Discharge: 2019-01-29 | Disposition: A | Discharge status: Home / Self Care | Payer: Medicare Other | Attending: Internal Medicine | Admitting: Internal Medicine

## 2019-01-28 ENCOUNTER — Encounter (HOSPITAL_COMMUNITY): Payer: Self-pay | Admitting: Emergency Medicine

## 2019-01-28 DIAGNOSIS — R404 Transient alteration of awareness: Secondary | ICD-10-CM | POA: Diagnosis present

## 2019-01-28 DIAGNOSIS — Z89512 Acquired absence of left leg below knee: Secondary | ICD-10-CM

## 2019-01-28 DIAGNOSIS — N2581 Secondary hyperparathyroidism of renal origin: Secondary | ICD-10-CM | POA: Diagnosis not present

## 2019-01-28 DIAGNOSIS — Z794 Long term (current) use of insulin: Secondary | ICD-10-CM | POA: Insufficient documentation

## 2019-01-28 DIAGNOSIS — I272 Pulmonary hypertension, unspecified: Secondary | ICD-10-CM | POA: Diagnosis not present

## 2019-01-28 DIAGNOSIS — I480 Paroxysmal atrial fibrillation: Secondary | ICD-10-CM | POA: Diagnosis not present

## 2019-01-28 DIAGNOSIS — B20 Human immunodeficiency virus [HIV] disease: Secondary | ICD-10-CM | POA: Diagnosis not present

## 2019-01-28 DIAGNOSIS — I5022 Chronic systolic (congestive) heart failure: Secondary | ICD-10-CM

## 2019-01-28 DIAGNOSIS — Z79899 Other long term (current) drug therapy: Secondary | ICD-10-CM

## 2019-01-28 DIAGNOSIS — R0601 Orthopnea: Secondary | ICD-10-CM | POA: Diagnosis not present

## 2019-01-28 DIAGNOSIS — E1122 Type 2 diabetes mellitus with diabetic chronic kidney disease: Secondary | ICD-10-CM

## 2019-01-28 DIAGNOSIS — I5042 Chronic combined systolic (congestive) and diastolic (congestive) heart failure: Secondary | ICD-10-CM | POA: Diagnosis not present

## 2019-01-28 DIAGNOSIS — K219 Gastro-esophageal reflux disease without esophagitis: Secondary | ICD-10-CM

## 2019-01-28 DIAGNOSIS — I132 Hypertensive heart and chronic kidney disease with heart failure and with stage 5 chronic kidney disease, or end stage renal disease: Secondary | ICD-10-CM | POA: Diagnosis not present

## 2019-01-28 DIAGNOSIS — E785 Hyperlipidemia, unspecified: Secondary | ICD-10-CM

## 2019-01-28 DIAGNOSIS — E1151 Type 2 diabetes mellitus with diabetic peripheral angiopathy without gangrene: Secondary | ICD-10-CM | POA: Diagnosis not present

## 2019-01-28 DIAGNOSIS — I440 Atrioventricular block, first degree: Secondary | ICD-10-CM

## 2019-01-28 DIAGNOSIS — Z8249 Family history of ischemic heart disease and other diseases of the circulatory system: Secondary | ICD-10-CM | POA: Insufficient documentation

## 2019-01-28 DIAGNOSIS — E118 Type 2 diabetes mellitus with unspecified complications: Secondary | ICD-10-CM | POA: Diagnosis present

## 2019-01-28 DIAGNOSIS — I739 Peripheral vascular disease, unspecified: Secondary | ICD-10-CM | POA: Insufficient documentation

## 2019-01-28 DIAGNOSIS — Z992 Dependence on renal dialysis: Secondary | ICD-10-CM

## 2019-01-28 DIAGNOSIS — R079 Chest pain, unspecified: Secondary | ICD-10-CM

## 2019-01-28 DIAGNOSIS — I25110 Atherosclerotic heart disease of native coronary artery with unstable angina pectoris: Principal | ICD-10-CM | POA: Insufficient documentation

## 2019-01-28 DIAGNOSIS — Z7901 Long term (current) use of anticoagulants: Secondary | ICD-10-CM | POA: Diagnosis not present

## 2019-01-28 DIAGNOSIS — Z6833 Body mass index (BMI) 33.0-33.9, adult: Secondary | ICD-10-CM | POA: Insufficient documentation

## 2019-01-28 DIAGNOSIS — Z7982 Long term (current) use of aspirin: Secondary | ICD-10-CM

## 2019-01-28 DIAGNOSIS — I2510 Atherosclerotic heart disease of native coronary artery without angina pectoris: Secondary | ICD-10-CM

## 2019-01-28 DIAGNOSIS — Z833 Family history of diabetes mellitus: Secondary | ICD-10-CM | POA: Insufficient documentation

## 2019-01-28 DIAGNOSIS — I5023 Acute on chronic systolic (congestive) heart failure: Secondary | ICD-10-CM | POA: Diagnosis present

## 2019-01-28 DIAGNOSIS — R918 Other nonspecific abnormal finding of lung field: Secondary | ICD-10-CM | POA: Insufficient documentation

## 2019-01-28 DIAGNOSIS — Z89511 Acquired absence of right leg below knee: Secondary | ICD-10-CM

## 2019-01-28 DIAGNOSIS — R7989 Other specified abnormal findings of blood chemistry: Secondary | ICD-10-CM | POA: Diagnosis not present

## 2019-01-28 DIAGNOSIS — N186 End stage renal disease: Secondary | ICD-10-CM | POA: Diagnosis not present

## 2019-01-28 DIAGNOSIS — I491 Atrial premature depolarization: Secondary | ICD-10-CM | POA: Insufficient documentation

## 2019-01-28 DIAGNOSIS — Z21 Asymptomatic human immunodeficiency virus [HIV] infection status: Secondary | ICD-10-CM

## 2019-01-28 DIAGNOSIS — R931 Abnormal findings on diagnostic imaging of heart and coronary circulation: Secondary | ICD-10-CM | POA: Insufficient documentation

## 2019-01-28 DIAGNOSIS — Z885 Allergy status to narcotic agent status: Secondary | ICD-10-CM

## 2019-01-28 DIAGNOSIS — I4581 Long QT syndrome: Secondary | ICD-10-CM

## 2019-01-28 LAB — POCT I-STAT 7, (LYTES, BLD GAS, ICA,H+H)
Bicarbonate: 24.4 mmol/L (ref 20.0–28.0)
Calcium, Ion: 1.09 mmol/L — ABNORMAL LOW (ref 1.15–1.40)
HEMATOCRIT: 26 % — AB (ref 39.0–52.0)
Hemoglobin: 8.8 g/dL — ABNORMAL LOW (ref 13.0–17.0)
O2 Saturation: 94 %
Patient temperature: 98.6
Potassium: 4.4 mmol/L (ref 3.5–5.1)
SODIUM: 133 mmol/L — AB (ref 135–145)
TCO2: 26 mmol/L (ref 22–32)
pCO2 arterial: 38.3 mmHg (ref 32.0–48.0)
pH, Arterial: 7.412 (ref 7.350–7.450)
pO2, Arterial: 70 mmHg — ABNORMAL LOW (ref 83.0–108.0)

## 2019-01-28 LAB — GLUCOSE, CAPILLARY
Glucose-Capillary: 230 mg/dL — ABNORMAL HIGH (ref 70–99)
Glucose-Capillary: 165 mg/dL — ABNORMAL HIGH (ref 70–99)
Glucose-Capillary: 136 mg/dL — ABNORMAL HIGH (ref 70–99)
Glucose-Capillary: 267 mg/dL — ABNORMAL HIGH (ref 70–99)
Glucose-Capillary: 301 mg/dL — ABNORMAL HIGH (ref 70–99)

## 2019-01-28 LAB — CBC
HCT: 29.4 % — ABNORMAL LOW (ref 39.0–52.0)
HEMOGLOBIN: 9.3 g/dL — AB (ref 13.0–17.0)
MCH: 30.7 pg (ref 26.0–34.0)
MCHC: 31.6 g/dL (ref 30.0–36.0)
MCV: 97 fL (ref 80.0–100.0)
Platelets: 152 10*3/uL (ref 150–400)
RBC: 3.03 MIL/uL — ABNORMAL LOW (ref 4.22–5.81)
RDW: 14.9 % (ref 11.5–15.5)
WBC: 8.4 10*3/uL (ref 4.0–10.5)
nRBC: 0 % (ref 0.0–0.2)

## 2019-01-28 LAB — BASIC METABOLIC PANEL
Anion gap: 16 — ABNORMAL HIGH (ref 5–15)
BUN: 52 mg/dL — ABNORMAL HIGH (ref 6–20)
CO2: 22 mmol/L (ref 22–32)
Calcium: 8.6 mg/dL — ABNORMAL LOW (ref 8.9–10.3)
Chloride: 96 mmol/L — ABNORMAL LOW (ref 98–111)
Creatinine, Ser: 10.58 mg/dL — ABNORMAL HIGH (ref 0.61–1.24)
GFR calc Af Amer: 6 mL/min — ABNORMAL LOW (ref >60)
GFR calc non Af Amer: 5 mL/min — ABNORMAL LOW (ref >60)
Glucose, Bld: 292 mg/dL — ABNORMAL HIGH (ref 70–99)
Potassium: 4.3 mmol/L (ref 3.5–5.1)
Sodium: 134 mmol/L — ABNORMAL LOW (ref 135–145)

## 2019-01-28 LAB — I-STAT TROPONIN, ED: Troponin i, poc: 0.16 ng/mL (ref 0.00–0.08)

## 2019-01-28 LAB — TROPONIN I
Troponin I: 0.14 ng/mL (ref <0.03)
Troponin I: 0.11 ng/mL (ref <0.03)

## 2019-01-28 LAB — PROTIME-INR
INR: 1.59
Prothrombin Time: 18.7 seconds — ABNORMAL HIGH (ref 11.4–15.2)

## 2019-01-28 MED ORDER — DARBEPOETIN ALFA 40 MCG/0.4ML IJ SOSY
PREFILLED_SYRINGE | INTRAMUSCULAR | Status: AC
  Administered 2019-01-28: 40 ug via INTRAVENOUS
  Filled 2019-01-28: qty 0.4

## 2019-01-28 MED ORDER — VANCOMYCIN HCL 10 G IV SOLR
2500.0000 mg | Freq: Once | INTRAVENOUS | Status: DC
  Filled 2019-01-28: qty 2500

## 2019-01-28 MED ORDER — SODIUM CHLORIDE 0.9% FLUSH: 3.0000 mL | Freq: Once | INTRAVENOUS | Status: DC

## 2019-01-28 MED ORDER — ROSUVASTATIN CALCIUM 5 MG PO TABS
10.0000 mg | ORAL_TABLET | Freq: Every day | ORAL | Status: DC
  Administered 2019-01-28 – 2019-01-29 (×2): 10 mg via ORAL
  Filled 2019-01-28 (×2): qty 2

## 2019-01-28 MED ORDER — DOXERCALCIFEROL 4 MCG/2ML IV SOLN
6.0000 ug | INTRAVENOUS | Status: DC
  Administered 2019-01-28: 6 ug via INTRAVENOUS
  Filled 2019-01-28: qty 4

## 2019-01-28 MED ORDER — METOPROLOL SUCCINATE ER 100 MG PO TB24
100.0000 mg | ORAL_TABLET | Freq: Every day | ORAL | Status: DC
  Administered 2019-01-28 – 2019-01-29 (×2): 100 mg via ORAL
  Filled 2019-01-28 (×2): qty 1

## 2019-01-28 MED ORDER — NITROGLYCERIN 0.4 MG SL SUBL: 0.4000 mg | SUBLINGUAL_TABLET | Freq: Once | SUBLINGUAL | Status: DC

## 2019-01-28 MED ORDER — DOXERCALCIFEROL 4 MCG/2ML IV SOLN
INTRAVENOUS | Status: AC
  Administered 2019-01-28: 6 ug via INTRAVENOUS
  Filled 2019-01-28: qty 4

## 2019-01-28 MED ORDER — WARFARIN SODIUM 5 MG PO TABS
8.0000 mg | ORAL_TABLET | Freq: Once | ORAL | Status: AC
  Administered 2019-01-28: 8 mg via ORAL
  Filled 2019-01-28: qty 1

## 2019-01-28 MED ORDER — INSULIN ASPART 100 UNIT/ML ~~LOC~~ SOLN
0.0000 [IU] | Freq: Every day | SUBCUTANEOUS | Status: DC
  Administered 2019-01-28: 4 [IU] via SUBCUTANEOUS

## 2019-01-28 MED ORDER — INSULIN ASPART 100 UNIT/ML ~~LOC~~ SOLN
8.0000 [IU] | Freq: Three times a day (TID) | SUBCUTANEOUS | Status: DC
  Administered 2019-01-28 (×2): 8 [IU] via SUBCUTANEOUS

## 2019-01-28 MED ORDER — WARFARIN - PHARMACIST DOSING INPATIENT
Freq: Every day | Status: DC
  Administered 2019-01-28: 1

## 2019-01-28 MED ORDER — INSULIN GLARGINE 100 UNIT/ML ~~LOC~~ SOLN
25.0000 [IU] | Freq: Every day | SUBCUTANEOUS | Status: DC
  Administered 2019-01-28: 25 [IU] via SUBCUTANEOUS
  Filled 2019-01-28: qty 0.25

## 2019-01-28 MED ORDER — SODIUM CHLORIDE 0.9 % IV SOLN: 100.0000 mL | INTRAVENOUS | Status: DC | PRN

## 2019-01-28 MED ORDER — DARBEPOETIN ALFA 40 MCG/0.4ML IJ SOSY
40.0000 ug | PREFILLED_SYRINGE | INTRAMUSCULAR | Status: DC
  Administered 2019-01-28: 40 ug via INTRAVENOUS
  Filled 2019-01-28: qty 0.4

## 2019-01-28 MED ORDER — NITROGLYCERIN 0.4 MG SL SUBL: 0.4000 mg | SUBLINGUAL_TABLET | SUBLINGUAL | Status: DC | PRN

## 2019-01-28 MED ORDER — ROSUVASTATIN CALCIUM 20 MG PO TABS: 20.0000 mg | ORAL_TABLET | Freq: Every day | ORAL | Status: DC

## 2019-01-28 MED ORDER — PANTOPRAZOLE SODIUM 40 MG PO TBEC
40.0000 mg | DELAYED_RELEASE_TABLET | Freq: Two times a day (BID) | ORAL | Status: DC
  Administered 2019-01-28 – 2019-01-29 (×3): 40 mg via ORAL
  Filled 2019-01-28 (×3): qty 1

## 2019-01-28 MED ORDER — ALTEPLASE 2 MG IJ SOLR: 2.0000 mg | Freq: Once | INTRAMUSCULAR | Status: DC | PRN

## 2019-01-28 MED ORDER — LIDOCAINE-PRILOCAINE 2.5-2.5 % EX CREA
1.0000 "application " | TOPICAL_CREAM | CUTANEOUS | Status: DC | PRN
  Filled 2019-01-28: qty 5

## 2019-01-28 MED ORDER — CHLORHEXIDINE GLUCONATE CLOTH 2 % EX PADS
6.0000 | MEDICATED_PAD | Freq: Every day | CUTANEOUS | Status: DC
  Administered 2019-01-28: 6 via TOPICAL

## 2019-01-28 MED ORDER — LIDOCAINE HCL (PF) 1 % IJ SOLN: 5.0000 mL | INTRAMUSCULAR | Status: DC | PRN

## 2019-01-28 MED ORDER — INSULIN ASPART 100 UNIT/ML ~~LOC~~ SOLN
0.0000 [IU] | Freq: Three times a day (TID) | SUBCUTANEOUS | Status: DC
  Administered 2019-01-28: 5 [IU] via SUBCUTANEOUS
  Administered 2019-01-28: 2 [IU] via SUBCUTANEOUS
  Administered 2019-01-29: 8 [IU] via SUBCUTANEOUS
  Administered 2019-01-29: 2 [IU] via SUBCUTANEOUS

## 2019-01-28 MED ORDER — AMIODARONE HCL 200 MG PO TABS
200.0000 mg | ORAL_TABLET | Freq: Every day | ORAL | Status: DC
  Administered 2019-01-28: 200 mg via ORAL
  Filled 2019-01-28: qty 1

## 2019-01-28 MED ORDER — ACETAMINOPHEN 325 MG PO TABS
650.0000 mg | ORAL_TABLET | Freq: Four times a day (QID) | ORAL | Status: DC | PRN
  Administered 2019-01-28: 650 mg via ORAL
  Filled 2019-01-28: qty 2

## 2019-01-28 MED ORDER — SODIUM CHLORIDE 0.9 % IV SOLN
2.0000 g | Freq: Once | INTRAVENOUS | Status: AC
  Administered 2019-01-28: 2 g via INTRAVENOUS
  Filled 2019-01-28: qty 2

## 2019-01-28 MED ORDER — RANOLAZINE ER 500 MG PO TB12
500.0000 mg | ORAL_TABLET | Freq: Two times a day (BID) | ORAL | Status: DC
  Administered 2019-01-28 – 2019-01-29 (×3): 500 mg via ORAL
  Filled 2019-01-28 (×3): qty 1

## 2019-01-28 MED ORDER — ASPIRIN 81 MG PO CHEW
81.0000 mg | CHEWABLE_TABLET | Freq: Every day | ORAL | Status: DC
  Administered 2019-01-28 – 2019-01-29 (×2): 81 mg via ORAL
  Filled 2019-01-28 (×2): qty 1

## 2019-01-28 MED ORDER — PENTAFLUOROPROP-TETRAFLUOROETH EX AERO: 1.0000 "application " | INHALATION_SPRAY | CUTANEOUS | Status: DC | PRN

## 2019-01-28 MED ORDER — BICTEGRAVIR-EMTRICITAB-TENOFOV 50-200-25 MG PO TABS
1.0000 | ORAL_TABLET | Freq: Every day | ORAL | Status: DC
  Administered 2019-01-28 – 2019-01-29 (×2): 1 via ORAL
  Filled 2019-01-28 (×2): qty 1

## 2019-01-28 NOTE — ED Notes (Signed)
Report given to 3E RN. All questions answered 

## 2019-01-28 NOTE — Progress Notes (Signed)
   Subjective: Patient reports he is feeling tired this morning. He felt well when he left the hospital on 1/28 but started having chest pain and dyspnea yesterday around 5 pm. He is still feeling short of breath this morning but denies chest pain. He is anuric at baseline. All questions and concerns addressed.  Objective:  Vital signs in last 24 hours: Vitals:   01/28/19 0545 01/28/19 0600 01/28/19 0656 01/28/19 0905  BP: (!) 151/86 (!) 146/87 (!) 159/97 124/79  Pulse: 71 74 76 78  Resp: (!) 21 20 18    Temp:   (!) 97.2 F (36.2 C)   TempSrc:   Oral   SpO2: 98%  98%   Weight:   130.5 kg    Physical exam: Gen: sleeping comfortably in bed lying flat, no distress. Pulm: faint bibasilar rales, on 3 L O2 by nasal cannula. CV: regular rate and rhythm without murmurs, JVD difficult to assess given patient's positioning. Ext: s/p bilateral BKA.  Assessment/Plan:  Active Problems:   Chest pain  Thomas [name] is a 53 yo male with CAD, ESRD on HD TuThSa, Afib on warfarin and amio, HIV, DMII, HTN, HLD, CHF (EF35-40%), bilateral BKA, and PAD who presented with chest painand new dyspnea, orthopnea, and PND. CXR showed pulmonary edema. He had no missed dialysis sessions recently. He was admitted for further evaluation and management.   Chest pain - Chest pain has resolved this morning. Likely 2/2 known CAD. Last LHC two days ago. No stenting or intervention recommended by cards. Continue medical management. - Discussed initiation of ranolazine for better anginal control with his cardiologist Dr. [name].  Plan - trend troponins - sublingual nitro PRN - continue home asa 81mg , rosuvastatin, metoprolol - start ranolazine for refractory angina, follow up EKG 1/31 to monitor for QT prolongation  SOB HFrEF ESRD - Seen on 3L oxygen this morning. Continues to endorse dyspnea. - Last HD session on Tues 1/28. Most likely 2/2 pulmonary edema.  - Low suspicion for pneumonia to explain new O2  requirement, patient given vancomycin/cefepime in the ED but we are holding antibiotics for now. Plan - nephrology has been consulted, appreciate recommendations - HD today 1/30 for volume management - Reassess symptoms s/p HD - Continue metoprolol  HTN - Continue home metoprolol - HD as above  DM2 - Home meds include glargine 30 units nightly, lispro 10 units BID wc. Plan -8 units aspart TID wc - glargine 25 units qPM - moderate SSI TID wc  Atrial fibrillation: Continue home metoprolol, amiodarone, and warfarin HLD: homerosuvastatin to 10 mg daily (renally dosed). [id number], home biktarvy. GERD: home pantoprazole 40mg  bid.  Dispo: Anticipated discharge in approximately 1-2 day(s).   Thomas [name], Medical Student 01/28/2019, 9:30 AM   Attestation for Student Documentation:  I personally was present and performed or re-performed the history, physical exam and medical decision-making activities of this service and have verified that the service and findings are accurately documented in the student's note.  Thomas [name], Andree Elk, MD 01/28/2019, 10:50 AM

## 2019-01-28 NOTE — Progress Notes (Addendum)
I have seen and examined this patient and agree with the plan of care. Plan dialysis treatment TTS   Sherril Croon 01/28/2019, 1:29 PM   [facility] Progress Note  Background:  53 year old gentleman with ESRD d/c 1/28 following an admission for CP 2/2 PAF with a hx pf severe 2 vessel CAD returns with chest pain. Cath last admission showed no significant change.His last HD here was 1/28 with net UF 3.7 and post wt 124.4. He had worsening SOB after d/c with CP and nausea. He also had PND, dry cough and orthopnea per admission note.  hgb at d/c was 9.2.  CXR suggestive of pulmonary edema.   Dialysis Orders: NWF 4.25 3 K 2.25 EDW 123  right lower AVF 500/800 no heparin Mircera 30 q r4 weeks hectorol 6 - last mircera 12/10  Assessment/Plan: 1. Chest pain - possibly made worse by excess volume  - known severe 2 vessel CAD / empiric antibiotics for  PNA  - no fever, no leukocytosis 2. ESRD -TTS - HD today - use 3 K bath due to hx afib 3. Anemia - hgb 9.3 - resume Aranesp 40 - last dose 12/10 4. Secondary hyperparathyroidism -continue hectorol/binders  5. HTN/volume - decrease volume - may need serial HD if BP doesn't tolerate - likely needs edw lowered LVED 24 last cath- if BP limits may need to reduce meds-  6. Nutrition - renal carb mod diet/vits/fuid restrict 7. Atrial fibrillation on amio/coumadin 8. HIV   [name], PA-C [facility] Beeper [phone] 01/28/2019,11:34 AM  LOS: 0 days   Subjective:   Still with central CP but able to lie flat without SOB.    Objective Vitals:   01/28/19 0545 01/28/19 0600 01/28/19 0656 01/28/19 0905  BP: (!) 151/86 (!) 146/87 (!) 159/97 124/79  Pulse: 71 74 76 78  Resp: (!) 21 20 18    Temp:   (!) 97.2 F (36.2 C)   TempSrc:   Oral   SpO2: 98%  98%   Weight:   130.5 kg    Physical Exam General: obese male sitting up at bedside breathing easily Heart: RRR no rub Lungs: grossly clear Abdomen: obese  soft Extremities: no thigh edema - bilateral prostheses in place Dialysis Access: right lower AVF    Additional Objective Labs: Basic Metabolic Panel: Recent Labs  Lab 01/23/19 0314 01/26/19 0700 01/28/19 0242 01/28/19 0401  NA 136 133* 134* 133*  K 4.1 4.5 4.3 4.4  CL 98 94* 96*  --   CO2 21* 21* 22  --   GLUCOSE 310* 228* 292*  --   BUN 38* 68* 52*  --   CREATININE 10.12* 11.94* 10.58*  --   CALCIUM 8.3* 8.5* 8.6*  --   PHOS  --  5.9*  --   --    Liver Function Tests: Recent Labs  Lab 01/23/19 0314 01/26/19 0700  AST 14*  --   ALT 22  --   ALKPHOS 88  --   BILITOT 1.2  --   PROT 7.2  --   ALBUMIN 3.5 3.2*   No results for input(s): LIPASE, AMYLASE in the last 168 hours. CBC: Recent Labs  Lab 01/22/19 1514 01/23/19 0314 01/26/19 0700 01/28/19 0242 01/28/19 0401  WBC 6.6 6.1 7.6 8.4  --   HGB 11.0* 10.7* 9.2* 9.3* 8.8*  HCT 33.3* 33.1* 28.6* 29.4* 26.0*  MCV 95.7 96.5 95.7 97.0  --   PLT 197 201 157 152  --  Blood Culture    Component Value Date/Time   SDES BLOOD LEFT FOREARM 12/05/2017 1313   SPECREQUEST  12/05/2017 1313    BOTTLES DRAWN AEROBIC AND ANAEROBIC Blood Culture adequate volume   CULT NO GROWTH 5 DAYS 12/05/2017 1313   REPTSTATUS 12/10/2017 FINAL 12/05/2017 1313    Cardiac Enzymes: Recent Labs  Lab 01/22/19 2209 01/23/19 0314  TROPONINI 0.09* 0.08*   CBG: Recent Labs  Lab 01/25/19 1136 01/25/19 1626 01/25/19 2139 01/26/19 1216 01/28/19 0753  GLUCAP 184* 278* 252* 176* 230*   Iron Studies: No results for input(s): IRON, TIBC, TRANSFERRIN, FERRITIN in the last 72 hours. Lab Results  Component Value Date   INR 1.59 01/28/2019   INR 1.65 01/26/2019   INR 1.71 01/25/2019   Studies/Results: Dg Chest 2 View  Result Date: 01/28/2019 CLINICAL DATA:  53 y/o  M; chest pain and shortness of breath. EXAM: CHEST - 2 VIEW COMPARISON:  01/22/2019 chest radiograph FINDINGS: Stable enlarged cardiac silhouette given projection and  technique. Increased diffuse hazy and reticular opacities of the lungs. No pleural effusion or pneumothorax. Bones are unremarkable. IMPRESSION: Increased hazy and reticular opacities of the lungs probably representing pulmonary edema. Underlying pneumonia is not excluded. Enlarged cardiac silhouette. Electronically Signed   By: Kristine Garbe M.D.   On: 01/28/2019 03:28   Ct Head Wo Contrast  Result Date: 01/28/2019 CLINICAL DATA:  New headache.  Anticoagulant use. EXAM: CT HEAD WITHOUT CONTRAST TECHNIQUE: Contiguous axial images were obtained from the base of the skull through the vertex without intravenous contrast. COMPARISON:  None. FINDINGS: Brain: No evidence of acute infarction, hemorrhage, hydrocephalus, extra-axial collection or mass lesion/mass effect. Remote small vessel infarcts in the bilateral basal ganglia, well-defined and chronic appearing on reformats. There are a few superficial parenchymal calcifications along the right parietal and left frontal convexity. These could be arterial/embolic. There is a segment of prominent atherosclerotic calcification of the right pericallosal artery. Vascular: Multifocal atherosclerotic calcification. Skull: No acute finding Sinuses/Orbits: Bilateral cataract resection IMPRESSION: 1. No acute finding. 2. Prominent atherosclerotic calcification for age. Remote basal ganglia infarcts. Electronically Signed   By: Monte Fantasia M.D.   On: 01/28/2019 05:33   Medications: . vancomycin     . amiodarone  200 mg Oral QHS  . aspirin  81 mg Oral Daily  . bictegravir-emtricitabine-tenofovir AF  1 tablet Oral Daily  . insulin aspart  0-15 Units Subcutaneous TID WC  . insulin aspart  0-5 Units Subcutaneous QHS  . insulin aspart  8 Units Subcutaneous TID WC  . insulin glargine  25 Units Subcutaneous QHS  . metoprolol succinate  100 mg Oral Daily  . nitroGLYCERIN  0.4 mg Sublingual Once  . pantoprazole  40 mg Oral BID  . ranolazine  500 mg Oral BID   . rosuvastatin  10 mg Oral Daily  . sodium chloride flush  3 mL Intravenous Once  . warfarin  8 mg Oral [id number]-1800  . Warfarin - Pharmacist Dosing Inpatient   Does not apply [phone]

## 2019-01-28 NOTE — Progress Notes (Signed)
CRITICAL VALUE ALERT  Critical Value:  Troponin 0.14  Date & Time Notied: 01/28/19 1229  Provider Notified: 01/28/19 1235  Orders Received/Actions taken: MD notified, will look at the labs. Marcille Blanco, RN

## 2019-01-28 NOTE — ED Triage Notes (Signed)
Pt c/o chest pain, SOB, nausea, and vomiting since 5pm.  Discharged from hospital on Tuesday for CP.

## 2019-01-28 NOTE — ED Notes (Signed)
Informed Dr Leonides Schanz of critical results for Trop. Dr Leonides Schanz to advise Lattie Haw.

## 2019-01-28 NOTE — ED Provider Notes (Signed)
[facility] EMERGENCY DEPARTMENT Provider Note   CSN: [id number] Arrival date & time: 01/28/19  0232     History   Chief Complaint Chief Complaint  Patient presents with  . Chest Pain    HPI Thomas [name] is a 53 y.o. male.  The history is provided by the patient and medical records.  Chest Pain    53 y.o. M with hx of anemia, PAF on coumadin, CHF, DM, ESRD on HD (schedule T, R, Sa), HIV, HLP, HTN, DM2, presenting to the ED for chest pain.  Patient released from hospital 2 days ago on 01/26/19 after admission for unstable angina.  He underwent repeat left heart cath that was grossly unchanged from last heart.  There were no recommended medication or therapy changes.  Symptoms were felt to be due to atrial flutter.  He converted back to normal sinus rhythm spontaneously while in the hospital and started doing better.  Wife states even when he went home Tuesday he was still complaining of some chest discomfort, but now his main complaint is mostly shortness of breath.  She states he cannot lay flat at all or else he feels like he is suffocating.  She states over the past 24 hours she has noticed some intermittent episodes of what seems like "disorientation".  States he seems to be falling asleep in the middle of conversation, even when sitting upright.  Patient states he is not sleeping at night because he feels grossly uncomfortable.  He has been having some headaches.  He did take a "sleeping pill" earlier tonight to try and get some rest but still unable to fall asleep for more than 20 mins at a time.  Earlier this evening he reports he felt an episode of "heart racing" again but denies this at present.  He did have full dialysis session on Tuesday while in the hospital, due for it again this morning.  Patient was hypoxic here upon returning from x-ray, he was placed on 3 L supplemental oxygen.  Wife reports he required oxygen while in the hospital as well but was not  discharged home with any.  He does not use CPAP.  Past Medical History:  Diagnosis Date  . Anemia   . Atrial fibrillation ([place])   . CAD (coronary artery disease), native coronary artery 01/22/2019  . Chest pain with moderate risk for cardiac etiology 06/15/2018  . Chronic combined systolic and diastolic CHF (congestive heart failure) ([place])   . Diabetic foot ulcer ([place]) 09/03/2017  . Diabetic wet gangrene of the foot ([place]) 09/04/2017  . ESRD (end stage renal disease) on dialysis [facility])    [facility] [address] T, [place], [place] (10/15/2018)  . Headache    "monthly" (06/15/2018)  . HIV disease ([place])   . Hyperlipidemia LDL goal <70 01/22/2019  . Hyperparathyroidism, secondary ([place])   . Hypertension   . Hypertensive heart disease with end stage renal disease on dialysis ([place]) 03/13/2017  . Mallory-Weiss tear 2018  . Pneumonia X 1  . Subacute osteomyelitis, right ankle and foot ([place])   . SVT (supraventricular tachycardia) ([place])    ? afib or atrial flutter s/p TEE/DCCV with subsequent ablation due to reoccurrence in [place]  . Type 2 diabetes mellitus [facility])     Patient Active Problem List   Diagnosis Date Noted  . Hyperlipidemia LDL goal <70 01/22/2019  . CAD (coronary artery disease), native coronary artery 01/22/2019  . Unstable angina ([place]) 01/22/2019  . Ascending aortic aneurysm ([place]) 12/07/2018  .  Noninfectious gastroenteritis 10/27/2018  . 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 Chest pain 10/15/2018  . Non-ST elevation (NSTEMI) myocardial infarction ([place])   . Chest pain with moderate risk for cardiac etiology 06/15/2018  . Atrial flutter ([place]) 06/15/2018  . Somniloquy 06/12/2018  . Encounter for therapeutic drug monitoring 05/27/2018  . Chronic systolic heart failure ([place])   . Murmur, cardiac 02/13/2018  . Mallory-Weiss tear 01/16/2018  . Morbid obesity ([place])   . Fall   . Subtherapeutic international normalized ratio (INR)   . History of left below knee amputation ([place]) 12/11/2017  .  History of supraventricular tachycardia   . S/P bilateral BKA (below knee amputation) ([place])   . Anemia of chronic disease   . Leukocytosis   . Non-ischemic cardiomyopathy ([place])   . Hypokalemia 09/04/2017  . Essential hypertension 09/03/2017  . GERD (gastroesophageal reflux disease) 09/03/2017  . GIB (gastrointestinal bleeding) 07/26/2017  . Anemia due to end stage renal disease ([place]) 07/26/2017  . PAF (paroxysmal atrial fibrillation) ([place]) 07/26/2017  . Symptomatic anemia 07/26/2017  . End stage renal disease ([place]) 05/05/2017  . HIV disease ([place]) 03/25/2017  . Hypertensive heart disease with end stage renal disease on dialysis ([place]) 03/13/2017  . ESRD on dialysis ([place]) 03/13/2017  . Type 2 diabetes mellitus with complication ([place]) 68/10/5725    Past Surgical History:  Procedure Laterality Date  . AMPUTATION Left    foot  . AMPUTATION Right 12/06/2017   Procedure: AMPUTATION BELOW KNEE;  Surgeon: [name], MD;  Location: [facility];  Service: Orthopedics;  Laterality: Right;  . APPLICATION OF WOUND VAC Right 09/04/2017   Procedure: APPLICATION OF WOUND VAC;  Surgeon: [name], DPM;  Location: [facility];  Service: Podiatry;  Laterality: Right;  . APPLICATION OF WOUND VAC  12/06/2017   Procedure: APPLICATION OF WOUND VAC;  Surgeon: [name], MD;  Location: [facility];  Service: Orthopedics;;  . AV FISTULA PLACEMENT Right 10/19/2012  . AV FISTULA REPAIR Right ~ 02/2018   "had it cleaned out"  . BELOW KNEE LEG AMPUTATION Left ~ 2016  . COLONOSCOPY WITH PROPOFOL N/A 07/30/2017   Procedure: COLONOSCOPY WITH PROPOFOL;  Surgeon: [name], MD;  Location: [facility];  Service: Endoscopy;  Laterality: N/A;  . ESOPHAGOGASTRODUODENOSCOPY (EGD) WITH PROPOFOL N/A 07/28/2017   Procedure: ESOPHAGOGASTRODUODENOSCOPY (EGD) WITH PROPOFOL;  Surgeon: [name], MD;  Location: [facility];  Service: Endoscopy;  Laterality: N/A;  . ESOPHAGOGASTRODUODENOSCOPY (EGD) WITH PROPOFOL N/A  01/15/2018   Procedure: ESOPHAGOGASTRODUODENOSCOPY (EGD) WITH PROPOFOL;  Surgeon: [name], MD;  Location: [facility] ENDOSCOPY;  Service: Endoscopy;  Laterality: N/A;  . FLEXIBLE SIGMOIDOSCOPY N/A 07/27/2017   Procedure: FLEXIBLE SIGMOIDOSCOPY;  Surgeon: [name], MD;  Location: [facility] ENDOSCOPY;  Service: Endoscopy;  Laterality: N/A;  . GRAFT APPLICATION Right 20/35/5974   Procedure: SKIN GRAFT APPLICATION RIGHT FOOT;  Surgeon: [name], DPM;  Location: [facility];  Service: Podiatry;  Laterality: Right;  . I&D EXTREMITY Right 09/04/2017   Procedure: IRRIGATION AND DEBRIDEMENT EXTREMITY;  Surgeon: [name], DPM;  Location: [facility];  Service: Podiatry;  Laterality: Right;  . I&D EXTREMITY Right 11/12/2017   Procedure: IRRIGATION AND DEBRIDEMENT ULCER RIGHT FOOT;  Surgeon: [name], DPM;  Location: [facility];  Service: Podiatry;  Laterality: Right;  . LEFT HEART CATH AND CORONARY ANGIOGRAPHY N/A 06/18/2018   Procedure: LEFT HEART CATH AND CORONARY ANGIOGRAPHY;  Surgeon: [name], Peter M, MD;  Location: [facility] CV LAB;  Service: Cardiovascular;  Laterality: N/A;  .  LEFT HEART CATH AND CORONARY ANGIOGRAPHY N/A 01/25/2019   Procedure: LEFT HEART CATH AND CORONARY ANGIOGRAPHY;  Surgeon: [name], Peter M, MD;  Location: [facility] CV LAB;  Service: Cardiovascular;  Laterality: N/A;  . WOUND DEBRIDEMENT N/A 09/24/2017   Procedure: DEBRIDEMENT WOUND;  Surgeon: [name], DPM;  Location: [facility];  Service: Podiatry;  Laterality: N/A;        Home Medications    Prior to Admission medications   Medication Sig Start Date End Date Taking? Authorizing Provider  acetaminophen (TYLENOL) 325 MG tablet Take 650 mg by mouth every 6 (six) hours as needed (for headaches).     [provider]  amiodarone (PACERONE) 200 MG tablet Take 1 tablet (200 mg total) by mouth daily. Patient taking differently: Take 200 mg by mouth at bedtime.  08/05/18   Consuelo Pandy, PA-C  aspirin 81 MG  chewable tablet Chew 1 tablet (81 mg total) by mouth daily. 01/26/19   [name], MD  bictegravir-emtricitabine-tenofovir AF (BIKTARVY) 50-200-25 MG TABS tablet Take 1 tablet by mouth daily. 03/02/18   Carlyle Basques, MD  camphor-menthol [facility]) lotion Apply topically as needed for itching. 12/11/17   Colbert Ewing, MD  cinacalcet (SENSIPAR) 90 MG tablet Take 90 mg by mouth daily. 04/27/18   [provider]  ferric citrate (AURYXIA) 1 GM 210 MG(Fe) tablet Take 420-840 mg by mouth See admin instructions. Take 840 mg by mouth three times a day after meals and 420 mg after each snack    [provider]  ferric gluconate 125 mg in sodium chloride 0.9 % 100 mL Inject 125 mg into the vein Every Tuesday,Thursday,and Saturday with dialysis. Patient not taking: Reported on 01/22/2019 05/10/17   Hosie Poisson, MD  gabapentin (NEURONTIN) 100 MG capsule Take 1 capsule (100 mg total) by mouth every dialysis. 01/09/18   Jamse Arn, MD  insulin glargine (LANTUS) 100 unit/mL SOPN Inject 0.3 mLs (30 Units total) into the skin at bedtime. Patient taking differently: Inject 20 Units into the skin at bedtime.  12/19/17   Angiulli, Lavon Paganini, PA-C  insulin lispro (HUMALOG) 100 UNIT/ML KiwkPen Please inject 10 units, twice daily, ~10 min before your two largest meals of the day. Patient taking differently: Inject 10 Units into the skin See admin instructions. Inject 10 units into the skin two times a day- 10 minutes before the 2 largest meals of the day 10/29/18   Kathi Ludwig, MD  linagliptin (TRADJENTA) 5 MG TABS tablet Take 1 tablet (5 mg total) by mouth daily. 12/07/18   [name], MD  metoprolol succinate (TOPROL-XL) 100 MG 24 hr tablet TAKE 1 TABLET BY MOUTH ONCE DAILY TAKE WITH OR IMMEDIATELY FOLLOWING A MEAL Patient taking differently: Take 100 mg by mouth daily. TAKE WITH OR IMMEDIATELY FOLLOWING A MEAL 08/05/18   Lyda Jester M, PA-C  pantoprazole (PROTONIX) 40 MG tablet  Take 2 tablets (80 mg total) by mouth daily. Patient taking differently: Take 40 mg by mouth 2 (two) times daily.  02/16/18   Alphonzo Grieve, MD  polycarbophil (FIBERCON) 625 MG tablet Take 1 tablet (625 mg total) by mouth daily. 12/19/17   Angiulli, Lavon Paganini, PA-C  rosuvastatin (CRESTOR) 20 MG tablet TAKE 1 TABLET BY MOUTH ONCE DAILY AT 6PM Patient taking differently: Take 20 mg by mouth daily.  08/19/18   [name], MD  warfarin (COUMADIN) 7.5 MG tablet TAKE 1/2 TABLET BY MOUTH DAILY, EXCEPT 1 TABLET ON Monday, Wednesday, AND Saturday OR AS DIRECTED BY  ANTICOAGULATION CLINIC. Patient taking differently: Take 3.75-7.5 mg by mouth at bedtime. Take 3.75 mg by mouth at bedtime on Sun/Tues/Thurs/Fri and 7.5 mg on Mon/Wed/Sat 12/25/18   Sueanne Margarita, MD    Family History Family History  Problem Relation Age of Onset  . Heart disease Mother   . Diabetes Mother   . Heart disease Father   . Cancer Neg Hx     Social History Social History   Tobacco Use  . Smoking status: Never Smoker  . Smokeless tobacco: Never Used  Substance Use Topics  . Alcohol use: Never    Frequency: Never  . Drug use: Never     Allergies   Oxycodone-acetaminophen   Review of Systems Review of Systems  Cardiovascular: Positive for chest pain.  All other systems reviewed and are negative.    Physical Exam Updated Vital Signs BP (!) 147/64   Pulse 77   Temp 98.7 F (37.1 C) (Oral)   Resp 19   SpO2 95%   Physical Exam Vitals signs and nursing note reviewed.  Constitutional:      Appearance: He is well-developed.     Comments: Somewhat somnolent, appears to be dozing off several times during exam  HENT:     Head: Normocephalic and atraumatic.  Eyes:     Conjunctiva/sclera: Conjunctivae normal.     Pupils: Pupils are equal, round, and reactive to light.  Neck:     Musculoskeletal: Normal range of motion.  Cardiovascular:     Rate and Rhythm: Normal rate and regular rhythm.     Heart  sounds: Normal heart sounds.  Pulmonary:     Effort: Pulmonary effort is normal.     Breath sounds: Normal breath sounds.  Abdominal:     General: Bowel sounds are normal.     Palpations: Abdomen is soft.  Musculoskeletal: Normal range of motion.     Comments: Right BKA with prosthesis Dialysis fistula right forearm, thrill noted  Skin:    General: Skin is warm and dry.  Neurological:     Mental Status: He is alert and oriented to person, place, and time.      ED Treatments / Results  Labs (all labs ordered are listed, but only abnormal results are displayed) Labs Reviewed  BASIC METABOLIC PANEL - Abnormal; Notable for the following components:      Result Value   Sodium 134 (*)    Chloride 96 (*)    Glucose, Bld 292 (*)    BUN 52 (*)    Creatinine, Ser 10.58 (*)    Calcium 8.6 (*)    GFR calc non Af Amer 5 (*)    GFR calc Af Amer 6 (*)    Anion gap 16 (*)    All other components within normal limits  CBC - Abnormal; Notable for the following components:   RBC 3.03 (*)    Hemoglobin 9.3 (*)    HCT 29.4 (*)    All other components within normal limits  PROTIME-INR - Abnormal; Notable for the following components:   Prothrombin Time 18.7 (*)    All other components within normal limits  I-STAT TROPONIN, ED - Abnormal; Notable for the following components:   Troponin i, poc 0.16 (*)    All other components within normal limits  POCT I-STAT 7, (LYTES, BLD GAS, ICA,H+H) - Abnormal; Notable for the following components:   pO2, Arterial 70.0 (*)    Sodium 133 (*)    Calcium, Ion 1.09 (*)  HCT 26.0 (*)    Hemoglobin 8.8 (*)    All other components within normal limits  CULTURE, BLOOD (ROUTINE X 2)  CULTURE, BLOOD (ROUTINE X 2)  BLOOD GAS, ARTERIAL    EKG EKG Interpretation  Date/Time:  Thursday January 28 2019 02:38:31 EST Ventricular Rate:  78 PR Interval:  240 QRS Duration: 106 QT Interval:  332 QTC Calculation: 378 R Axis:   106 Text Interpretation:   Sinus rhythm with 1st degree A-V block with Fusion complexes Rightward axis ST & T wave abnormality, consider inferior ischemia Abnormal ECG No significant change since last tracing Confirmed by Pryor Curia [id number]) on 01/28/2019 2:59:24 AM   Radiology Dg Chest 2 View  Result Date: 01/28/2019 CLINICAL DATA:  53 y/o  M; chest pain and shortness of breath. EXAM: CHEST - 2 VIEW COMPARISON:  01/22/2019 chest radiograph FINDINGS: Stable enlarged cardiac silhouette given projection and technique. Increased diffuse hazy and reticular opacities of the lungs. No pleural effusion or pneumothorax. Bones are unremarkable. IMPRESSION: Increased hazy and reticular opacities of the lungs probably representing pulmonary edema. Underlying pneumonia is not excluded. Enlarged cardiac silhouette. Electronically Signed   By: Kristine Garbe M.D.   On: 01/28/2019 03:28   Ct Head Wo Contrast  Result Date: 01/28/2019 CLINICAL DATA:  New headache.  Anticoagulant use. EXAM: CT HEAD WITHOUT CONTRAST TECHNIQUE: Contiguous axial images were obtained from the base of the skull through the vertex without intravenous contrast. COMPARISON:  None. FINDINGS: Brain: No evidence of acute infarction, hemorrhage, hydrocephalus, extra-axial collection or mass lesion/mass effect. Remote small vessel infarcts in the bilateral basal ganglia, well-defined and chronic appearing on reformats. There are a few superficial parenchymal calcifications along the right parietal and left frontal convexity. These could be arterial/embolic. There is a segment of prominent atherosclerotic calcification of the right pericallosal artery. Vascular: Multifocal atherosclerotic calcification. Skull: No acute finding Sinuses/Orbits: Bilateral cataract resection IMPRESSION: 1. No acute finding. 2. Prominent atherosclerotic calcification for age. Remote basal ganglia infarcts. Electronically Signed   By: Monte Fantasia M.D.   On: 01/28/2019 05:33     Procedures Procedures (including critical care time)  Medications Ordered in ED Medications  sodium chloride flush (NS) 0.9 % injection 3 mL (3 mLs Intravenous Not Given 01/28/19 0518)  vancomycin (VANCOCIN) 2,500 mg in sodium chloride 0.9 % 500 mL IVPB (has no administration in time range)  ceFEPIme (MAXIPIME) 2 g in sodium chloride 0.9 % 100 mL IVPB (2 g Intravenous New Bag/Given 01/28/19 0517)     Initial Impression / Assessment and Plan / ED Course  I have reviewed the triage vital signs and the nursing notes.  Pertinent labs & imaging results that were available during my care of the patient were reviewed by me and considered in my medical decision making (see chart for details).  53 year old male here with chest pain.  Recently admitted for same and discharged from this facility 2 days ago.  He did undergo left heart cath during that hospitalization that was grossly unchanged from prior.  No acute changes in medications or other therapy was recommended.  Symptoms were felt to be due to atrial flutter.  Since discharge, has continued to have some discomfort but wife reports mostly has been complaining of increased shortness of breath.  Has had a cough but denies any fevers or mucus production.  Wife states also he has seemed a bit "off".  States he has not been sleeping but has been dozing off and seeming a little confused at  times.  States he did take a "sleeping pill" last evening but this did not help and symptoms were present even prior to taking this.    EKG here grossly unchanged from prior.  Troponin is elevated at 0.16, patient has history of somewhat chronically elevated troponins.  Labs are overall reassuring given his dialysis status, potassium is normal.  During exam, patient does appear somewhat somnolent and is falling asleep several times during my examination.   When he arouses, he answers questions appropriately.   He is currently requiring 3 L supplemental oxygen which is  not his baseline.  Wife reports he was requiring oxygen while in the hospital but was not sent home with oxygen.  Chest x-ray today with finding of opacities, felt to represent pulmonary edema but underlying pneumonia not excluded.  Given patient's presentation and recent hospital admission, will initiate antibiotics.  Blood gas obtained that is reassuring without any noted hypercapnia.  Patient now complaining of headache, wife states he has been complaining of this some at home as well.  Generally does not have any significant headaches.  Was on heparin while in the hospital and is still on Coumadin.  CT head without acute findings.    Case discussed with IM teaching service-- they will come evaluate patient for admission.  Final Clinical Impressions(s) / ED Diagnoses   Final diagnoses:  Transient alteration of awareness  Chest pain in adult    ED Discharge Orders    None       Larene Pickett, PA-C 01/28/19 0600    Ward, Delice Bison, DO 01/28/19 [phone]

## 2019-01-28 NOTE — Progress Notes (Signed)
ANTICOAGULATION CONSULT NOTE - Initial Consult  Pharmacy Consult for heparin Indication: atrial fibrillation  Allergies  Allergen Reactions  . Oxycodone-Acetaminophen Other (See Comments)    Hallucinations/delirum    Vital Signs: Temp: 98.7 F (37.1 C) (01/30 0249) Temp Source: Oral (01/30 0249) BP: 151/86 (01/30 0545) Pulse Rate: 71 (01/30 0545)  Labs: Recent Labs    01/26/19 0214  01/26/19 0700 01/28/19 0242 01/28/19 0401  HGB  --    < > 9.2* 9.3* 8.8*  HCT  --   --  28.6* 29.4* 26.0*  PLT  --   --  157 152  --   LABPROT 19.3*  --   --  18.7*  --   INR 1.65  --   --  1.59  --   CREATININE  --   --  11.94* 10.58*  --    < > = values in this interval not displayed.    Estimated Creatinine Clearance: 11.9 mL/min (A) (by C-G formula based on SCr of 10.58 mg/dL (H)).   Medical History: Past Medical History:  Diagnosis Date  . Anemia   . Atrial fibrillation ([place])   . CAD (coronary artery disease), native coronary artery 01/22/2019  . Chest pain with moderate risk for cardiac etiology 06/15/2018  . Chronic combined systolic and diastolic CHF (congestive heart failure) ([place])   . Diabetic foot ulcer ([place]) 09/03/2017  . Diabetic wet gangrene of the foot ([place]) 09/04/2017  . ESRD (end stage renal disease) on dialysis [facility])    [place] [address] T, [place], [place] (10/15/2018)  . Headache    "monthly" (06/15/2018)  . HIV disease ([place])   . Hyperlipidemia LDL goal <70 01/22/2019  . Hyperparathyroidism, secondary ([place])   . Hypertension   . Hypertensive heart disease with end stage renal disease on dialysis ([place]) 03/13/2017  . Mallory-Weiss tear 2018  . Pneumonia X 1  . Subacute osteomyelitis, right ankle and foot ([place])   . SVT (supraventricular tachycardia) ([place])    ? afib or atrial flutter s/p TEE/DCCV with subsequent ablation due to reoccurrence in [place]  . Type 2 diabetes mellitus (H[facility])     Assessment: 53yo male was discharged 2d ago after stay for USAP, now being readmitted for  AMS and recurrent CP, to continue Coumadin for Afib; of note Coumadin had been held during recent admission for [facility] and was resumed prior to discharge; INR currently below goal.  Goal of Therapy:  INR 2-3   Plan:  Will give boosted Coumadin dose of 8mg  po x1 today and monitor INR for dose adjustments.  Wynona Neat, PharmD, BCPS  01/28/2019,6:09 AM

## 2019-01-28 NOTE — H&P (Signed)
Date: 01/28/2019               Patient Name:  Thomas [name] MRN: [id number]  DOB: 05-06-1966 Age / Sex: 53 y.o., male   PCP: [name], MD         Medical Service: Internal Medicine Teaching Service         Attending Physician: Dr. Rebeca Alert, Raynaldo Opitz, MD    First Contact: Dr. Annie Paras Pager: [phone]  Second Contact: Dr. Tarri Abernethy Pager: [phone]       After Hours (After 5p/  First Contact Pager: [phone]  weekends / holidays): Second Contact Pager: [phone]   Chief Complaint: chest pain  History of Present Illness:  53yo male with CAD, ESRD on HD TRS, a. Fib on warfarin and amio, HIV, DM2, HTN, HLD, CHF (EF 35-40%), bilateral BKA, PAD presenting with chest pain. Recently discharged from hospital on 1/28 after admission for unstable angina during which he had a LHC that was largely unchanged from prior and symptoms were felt to be due to atrial flutter of which he spontaneously converted out of and back to NSR. However, on my interpretation of his initial EKG it appeared to be NSR with prolonged QTc, not atrial flutter. No medication changes were made and he was not sent home with any additional anti-anginal agents.   He states that the night he got home from the hospital his chest pain returned along with worsening shortness of breath. He was trying to sleep when his symptoms began. These symptoms are worse when he lays flat and he has therefore had trouble sleeping the past two nights. His chest pain is sharp, 8 out of 10, located in the middle of his chest, non-radiating, with associated nausea. He has not tried anything to make his chest pain better. His sob is accompanied by a dry cough, orthopnea, and PND. His last HD was Tuesday and they removed 4.2L. His dry weight is 125kg.   He denies fevers, chills, vomiting, diarrhea, sore throat, productive cough. He also endorses decreased appetite.   Meds:  Current Meds  Medication Sig  . acetaminophen (TYLENOL) 325 MG tablet Take  650 mg by mouth every 6 (six) hours as needed (for headaches).   Marland Kitchen amiodarone (PACERONE) 200 MG tablet Take 1 tablet (200 mg total) by mouth daily. (Patient taking differently: Take 200 mg by mouth at bedtime. )  . aspirin 81 MG chewable tablet Chew 1 tablet (81 mg total) by mouth daily.  . bictegravir-emtricitabine-tenofovir AF (BIKTARVY) 50-200-25 MG TABS tablet Take 1 tablet by mouth daily.  . camphor-menthol (SARNA) lotion Apply topically as needed for itching. (Patient taking differently: Apply 1 application topically daily as needed for itching. )  . cinacalcet (SENSIPAR) 90 MG tablet Take 90 mg by mouth daily.  . ferric citrate (AURYXIA) 1 GM 210 MG(Fe) tablet Take 420-840 mg by mouth See admin instructions. Take 840 mg by mouth three times a day after meals and 420 mg after each snack  . gabapentin (NEURONTIN) 100 MG capsule Take 1 capsule (100 mg total) by mouth every dialysis.  Marland Kitchen insulin glargine (LANTUS) 100 unit/mL SOPN Inject 0.3 mLs (30 Units total) into the skin at bedtime. (Patient taking differently: Inject 20 Units into the skin at bedtime. )  . insulin lispro (HUMALOG) 100 UNIT/ML KiwkPen Please inject 10 units, twice daily, ~10 min before your two largest meals of the day. (Patient taking differently: Inject 10 Units into the skin See admin instructions. Inject 10 units  into the skin two times a day- 10 minutes before the 2 largest meals of the day)  . linagliptin (TRADJENTA) 5 MG TABS tablet Take 1 tablet (5 mg total) by mouth daily.  . metoprolol succinate (TOPROL-XL) 100 MG 24 hr tablet TAKE 1 TABLET BY MOUTH ONCE DAILY TAKE WITH OR IMMEDIATELY FOLLOWING A MEAL (Patient taking differently: Take 100 mg by mouth daily. TAKE WITH OR IMMEDIATELY FOLLOWING A MEAL)  . pantoprazole (PROTONIX) 40 MG tablet Take 2 tablets (80 mg total) by mouth daily. (Patient taking differently: Take 40 mg by mouth 2 (two) times daily. )  . polycarbophil (FIBERCON) 625 MG tablet Take 1 tablet (625 mg  total) by mouth daily.  . rosuvastatin (CRESTOR) 20 MG tablet TAKE 1 TABLET BY MOUTH ONCE DAILY AT 6PM (Patient taking differently: Take 20 mg by mouth daily. )  . warfarin (COUMADIN) 7.5 MG tablet TAKE 1/2 TABLET BY MOUTH DAILY, EXCEPT 1 TABLET ON Monday, Wednesday, AND Saturday OR AS DIRECTED BY ANTICOAGULATION CLINIC. (Patient taking differently: Take 3.75-7.5 mg by mouth at bedtime. Take 3.75 mg by mouth at bedtime on Sun/Tues/Thurs/Fri and 7.5 mg on Mon/Wed/Sat)     Allergies: Allergies as of 01/28/2019 - Review Complete 01/25/2019  Allergen Reaction Noted  . Oxycodone-acetaminophen Other (See Comments) 01/13/2018   Past Medical History:  Diagnosis Date  . Anemia   . Atrial fibrillation ([place])   . CAD (coronary artery disease), native coronary artery 01/22/2019  . Chest pain with moderate risk for cardiac etiology 06/15/2018  . Chronic combined systolic and diastolic CHF (congestive heart failure) ([place])   . Diabetic foot ulcer ([place]) 09/03/2017  . Diabetic wet gangrene of the foot ([place]) 09/04/2017  . ESRD (end stage renal disease) on dialysis [facility])    [address] [place], [place] (10/15/2018)  . Headache    "monthly" (06/15/2018)  . HIV disease ([place])   . Hyperlipidemia LDL goal <70 01/22/2019  . Hyperparathyroidism, secondary ([place])   . Hypertension   . Hypertensive heart disease with end stage renal disease on dialysis ([place]) 03/13/2017  . Mallory-Weiss tear 2018  . Pneumonia X 1  . Subacute osteomyelitis, right ankle and foot ([place])   . SVT (supraventricular tachycardia) ([place])    ? afib or atrial flutter s/p TEE/DCCV with subsequent ablation due to reoccurrence in [place]  . Type 2 diabetes mellitus ([facility])     Family History:  Family History  Problem Relation Age of Onset  . Heart disease Mother   . Diabetes Mother   . Heart disease Father   . Cancer Neg Hx      Social History: Never smoker. Denies alcohol or drug use  Review of Systems: A complete ROS was negative except as  per HPI.   Physical Exam: Blood pressure (!) 146/87, pulse 74, temperature 98.7 F (37.1 C), temperature source Oral, resp. rate 20, SpO2 98 %. Gen: appears tired, NAD, nasal cannula in place, wife at bedside HENT: oropharynx is without exudate or erythema, conjunctiva without erythema Cardiac: RRR, no m/r/g, chest wall is non tender to palpation, no JVD but difficult to assess given body habitus Pulm: faint crackles at bilateral bases, breathing and speaking comfortably on 2L [place], desatted to 89% when turned to RA. Abd: obese, soft, NT, ND, active BS Ext: bilateral BKAs Neuro: A&O x 3, somnolent but easily arousable, CN2-12 grossly intact, moving all extremities equally   Labs: CBC: hgb 9.3 (baseline 9-11)  Na 134, K 4.3 Glucose 292 Creatine 10.58, BUN  52  Trop 0.16  INR 1.59  ABG: pH 7.4, co2 38, o2 70   EKG: personally reviewed my interpretation is significant motion artifact, recommend repeat   CXR: personally reviewed my interpretation is increased bilateral pulmonary edema, cannot rule out pneumonia   CT Head: 1. No acute finding. 2. Prominent atherosclerotic calcification for age. Remote basal ganglia infarcts.  Assessment & Plan by Problem: Active Problems:   Chest pain  53yo male with CAD, ESRD on HD TRS, a. Fib on warfarin and amio, HIV, DM2, HTN, HLD, CHF (EF 35-40%), bilateral BKA, PAD presenting with chest pain and new sob, orthopnea, PND with CXR showing pulmonary edema   Chest pain: Recently discharged from hospital on 1/28 after admission for unstable angina during which he had a LHC that was largely unchanged from prior and symptoms were felt to be due to atrial flutter of which he spontaneously converted out of and back to NSR. However, on my interpretation of his initial EKG it appeared to be NSR with prolonged QTc, not atrial flutter. No medication changes were made and he was not sent home with any additional anti-anginal agents. - chest pain most likely  2/2 known severe CAD   - initial poc Troponin 0.16, will trend - initial ekg difficult to interpret due to motion artifact, will repeat  - SL nitro prn  - continue asa 81mg , statin  - consider addition of IMDUR vs Isordil once stabilizes HD  SOB, ESRD: New oxygen requirement of 2L [place]. SOB Most likely 2/2 pulmonary edema given findings on CXR and symptoms of orthopnea and PND. Etiology over volume overload is unknown at this time. He has been compliant with HD sessions, reports good fluid removal, and denies increased fluid intake.  - s/p cefepime and vancomycin in the ED for possible pneumonia, will reevaluate need for abx after dialysis  - nephro c/s for HD today   Chronic HFrEF: recent echo with EF 35-40%, severe LVH and LAE, mild pulmonary HTN - continue home metoprolol, he is currently tachycardic and has not had this medication today  A. Fib: NSR - continue home metoprolol, amiodarone, and warfarin   HTN: slightly hypertensive on admission. Continue home metoprolol. HD as above   HLD: continue home rosuvastatin to 10mg  qd (renally dosed)  DM2: Last a1c 11.4. Home meds include glargine 30U nightly, lispro 10U BID wc - 8U novolog tid wc + moderate SSI tid wc - lantus 25U nightly   HIV: well controlled. Last RNA quant was undetectable - continue home biktarvy   GERD: continue home pantoprazole 40mg  bid  Diet: HH Code: Full DVT ppx: warfarin  Dispo: Admit patient to Observation with expected length of stay less than 2 midnights.  Signed: Isabelle Course, MD 01/28/2019, 6:49 AM  Pager: [phone]

## 2019-01-28 NOTE — ED Notes (Signed)
Pt calling out from room regarding "new chest pain". When in room, pt references pain that began at 5pm and c/o headache now as well.

## 2019-01-29 DIAGNOSIS — J811 Chronic pulmonary edema: Secondary | ICD-10-CM | POA: Diagnosis not present

## 2019-01-29 DIAGNOSIS — E1122 Type 2 diabetes mellitus with diabetic chronic kidney disease: Secondary | ICD-10-CM | POA: Diagnosis not present

## 2019-01-29 DIAGNOSIS — R0602 Shortness of breath: Secondary | ICD-10-CM

## 2019-01-29 DIAGNOSIS — R079 Chest pain, unspecified: Secondary | ICD-10-CM | POA: Diagnosis not present

## 2019-01-29 DIAGNOSIS — B20 Human immunodeficiency virus [HIV] disease: Secondary | ICD-10-CM | POA: Diagnosis not present

## 2019-01-29 DIAGNOSIS — I4891 Unspecified atrial fibrillation: Secondary | ICD-10-CM

## 2019-01-29 DIAGNOSIS — I25118 Atherosclerotic heart disease of native coronary artery with other forms of angina pectoris: Secondary | ICD-10-CM

## 2019-01-29 DIAGNOSIS — I132 Hypertensive heart and chronic kidney disease with heart failure and with stage 5 chronic kidney disease, or end stage renal disease: Secondary | ICD-10-CM | POA: Diagnosis not present

## 2019-01-29 DIAGNOSIS — I5020 Unspecified systolic (congestive) heart failure: Secondary | ICD-10-CM

## 2019-01-29 DIAGNOSIS — I25110 Atherosclerotic heart disease of native coronary artery with unstable angina pectoris: Secondary | ICD-10-CM | POA: Diagnosis not present

## 2019-01-29 LAB — GLUCOSE, CAPILLARY
Glucose-Capillary: 138 mg/dL — ABNORMAL HIGH (ref 70–99)
Glucose-Capillary: 246 mg/dL — ABNORMAL HIGH (ref 70–99)
Glucose-Capillary: 254 mg/dL — ABNORMAL HIGH (ref 70–99)
Glucose-Capillary: 288 mg/dL — ABNORMAL HIGH (ref 70–99)

## 2019-01-29 LAB — PROTIME-INR
INR: 1.83
Prothrombin Time: 20.9 seconds — ABNORMAL HIGH (ref 11.4–15.2)

## 2019-01-29 MED ORDER — INSULIN ASPART 100 UNIT/ML ~~LOC~~ SOLN: 11.0000 [IU] | Freq: Three times a day (TID) | SUBCUTANEOUS | Status: DC

## 2019-01-29 MED ORDER — INSULIN ASPART 100 UNIT/ML ~~LOC~~ SOLN
10.0000 [IU] | Freq: Three times a day (TID) | SUBCUTANEOUS | Status: DC
  Administered 2019-01-29 (×2): 10 [IU] via SUBCUTANEOUS

## 2019-01-29 MED ORDER — ROSUVASTATIN CALCIUM 20 MG PO TABS: 10.0000 mg | ORAL_TABLET | Freq: Every day | ORAL | 1 refills | Status: DC

## 2019-01-29 MED ORDER — RANOLAZINE ER 500 MG PO TB12: 500.0000 mg | ORAL_TABLET | Freq: Two times a day (BID) | ORAL | 0 refills | Status: AC

## 2019-01-29 MED ORDER — NITROGLYCERIN 0.4 MG SL SUBL: 0.4000 mg | SUBLINGUAL_TABLET | SUBLINGUAL | 0 refills | Status: AC | PRN

## 2019-01-29 MED ORDER — WARFARIN SODIUM 2.5 MG PO TABS
3.7500 mg | ORAL_TABLET | Freq: Once | ORAL | Status: DC
  Filled 2019-01-29: qty 1

## 2019-01-29 NOTE — Progress Notes (Signed)
   Subjective: Reports feeling much better after dialysis yesterday.  He is tolerating ranolazine and has not had any chest pain post-dialysis.  Patient is very pleasant this morning and able to sit up comfortably for physical exam.  He mentioned that he would like to start a diet for weight loss when he leaves the hospital and was advised to keep his kidney doctors in the loop in case adjustments to HD are necessary.  We discussed possibility of discharge this afternoon depending on whether nephrology feels that he needs serial dialysis, he is comfortable with this plan.  Objective:  Vital signs in last 24 hours: Vitals:   01/28/19 1917 01/29/19 0030 01/29/19 0437 01/29/19 0846  BP: 109/75 123/60 (!) 129/93 121/86  Pulse: 94 90 86 82  Resp: 18 20 20    Temp: 98.3 F (36.8 C) 98.3 F (36.8 C) 98.1 F (36.7 C)   TempSrc: Oral Oral Oral   SpO2: 94% 94% 91%   Weight:   126.4 kg    Gen: pleasant, sitting upright in bed without discomfort. Pulm: able to maintain extended conversation on room air, clear to ausculation without crackles bilaterally CV: regular rate and rhythm without murmurs Ext: s/p bilateral BKA.  Assessment/Plan:  Mr. Demonte is a 73 yomale with CAD, ESRD on HD TuThSa,Afibon warfarin and amio, HIV, DMII, HTN, HLD, CHF (EF35-40%), bilateral BKA,andPADwho presentedwith chest painand newdyspnea, orthopnea,andPND.CXR showedpulmonary edema. He had no missed dialysis sessions recently. He was admitted for further evaluation and management.  Chest Pain - Chest pain has resolved this morning with HD and addition of ranolazine.  His presentation is consistent with demand ischemia in setting of known CAD and volume overload between dialysis sessions. - Troponins have been slightly elevated but downtrending. Likely demand ischemia plus ESRD. - No significant change in QTc since adding ranolazine - No interventions necessary, continue medical management. Plan - continue  home asa 81 mg, metoprolol - continue rosuvastatin 10mg  - sublingual nitro PRN - ranolazine 500 mg BID - ready for discharge today  SOB HFrEF ESRD - Symptoms improved after HD, patient off supplemental oxygen and breathing comfortably. - Tolerated HD yesterday with 3.7 L removed. Nephrology has decreased his dry weight to avoid fluid accumulation between HD sessions Plan - Okay to discharge home from nephrology standpoint  - Continue home metoprolol.  HTN -Continue home metoprolol -HD as above  DM2 - Home meds include glargine 30 units nightly, lispro 10 units BID wc. - Evening and AM glucose has been elevated between 200-300 Plan -increase aspart from 8 to 10 units TID wc - continue glargine 25 units qPM - moderate SSI TID wc  Atrial fibrillation: Continue home metoprolol, amiodarone, and warfarin HLD: homerosuvastatin to 10 mg daily (renally dosed) EHM:[id number], home biktarvy GERD: home pantoprazole 40mg  bid  Dispo: Patient is medically stable for discharge today.   Gypsy Decant, Medical Student 01/29/2019, 11:37 AM  Attestation for Student Documentation:  I personally was present and performed or re-performed the history, physical exam and medical decision-making activities of this service and have verified that the service and findings are accurately documented in the student's note.  Dorrell, Andree Elk, MD 01/29/2019, 1:14 PM

## 2019-01-29 NOTE — Progress Notes (Signed)
Pt s/p HD yesterday, 3.7L UF, post weight 122.3kg, under EDW of 123kg.  Pt denies CP or SOB, feels ready for DC.    On RA, VSS.    OK for DC. He will go to outpt HD tomorrow. Will lower EDW to 122kg at DC.

## 2019-01-29 NOTE — Progress Notes (Signed)
ANTICOAGULATION CONSULT NOTE - Follow Up Consult  Pharmacy Consult for Coumadin Indication: atrial fibrillation  Allergies  Allergen Reactions  . Oxycodone-Acetaminophen Other (See Comments)    Hallucinations/delirum    Patient Measurements: Weight: 278 lb 10.6 oz (126.4 kg)  Vital Signs: Temp: 98.1 F (36.7 C) (01/31 0437) Temp Source: Oral (01/31 0437) BP: 121/86 (01/31 0846) Pulse Rate: 82 (01/31 0846)  Labs: Recent Labs    01/28/19 0242 01/28/19 0401 01/28/19 1101 01/28/19 2053 01/29/19 0614  HGB 9.3* 8.8*  --   --   --   HCT 29.4* 26.0*  --   --   --   PLT 152  --   --   --   --   LABPROT 18.7*  --   --   --  20.9*  INR 1.59  --   --   --  1.83  CREATININE 10.58*  --   --   --   --   TROPONINI  --   --  0.14* 0.11*  --     Estimated Creatinine Clearance: 12 mL/min (A) (by C-G formula based on SCr of 10.58 mg/dL (H)).   Assessment: 53 year old male on Coumadin prior to admission for atrial fibrillation with goal INR of 2-3 to resume inpatient per pharmacy protocol.   INR on admission was low at 1.59 (patient recently admitted and held for heart cath then resumed at discharge on 1/28). Note that per Anticoagulation clinic notes patient had been therapeutic on home regimen on 7.5mg  on Mondays/Wednesdays/Saturdays and 3.75mg  all other days prior to this.   INR today is up at 1.83 after 1 dose of 8 mg.  H/H was low on admission yesterday. Platelets were stable. No bleeding noted.   Goal of Therapy:  INR 2-3 Monitor platelets by anticoagulation protocol: Yes   Plan:  Coumadin 3.75mg  po x1 tonight if still here per home regimen.  If to discharge today, recommend continue home regimen of 7.5mg  on Mondays/Wednesdays/Saturdays and 3.75mg  all other days with follow-up in Anticoagulation clinic.  Daily PT/INR   Sloan Leiter, PharmD, BCPS, BCCCP Clinical Pharmacist Please refer to [facility] for [place] numbers 01/29/2019,10:25 AM

## 2019-01-29 NOTE — Discharge Instructions (Addendum)

## 2019-01-29 NOTE — Discharge Summary (Signed)
Name: Thomas [name] MRN: [id number] DOB: 09/22/1966 53 y.o. PCP: [name], MD  Date of Admission: 01/28/2019  2:32 AM Date of Discharge: 01/29/2019 Attending Physician: Dr. Rebeca Alert  Discharge Diagnosis: 1. Dyspnea 2. Stable angina 3. ESRD 4. HFrEF  Discharge Medications: Allergies as of 01/29/2019      Reactions   Oxycodone-acetaminophen Other (See Comments)   Hallucinations/delirum      Medication List    TAKE these medications   acetaminophen 325 MG tablet Commonly known as:  TYLENOL Take 650 mg by mouth every 6 (six) hours as needed (for headaches).   amiodarone 200 MG tablet Commonly known as:  PACERONE Take 1 tablet (200 mg total) by mouth daily. What changed:  when to take this   aspirin 81 MG chewable tablet Chew 1 tablet (81 mg total) by mouth daily.   AURYXIA 1 GM 210 MG(Fe) tablet Generic drug:  ferric citrate Take 420-840 mg by mouth See admin instructions. Take 840 mg by mouth three times a day after meals and 420 mg after each snack   bictegravir-emtricitabine-tenofovir AF 50-200-25 MG Tabs tablet Commonly known as:  BIKTARVY Take 1 tablet by mouth daily.   camphor-menthol lotion Commonly known as:  SARNA Apply topically as needed for itching. What changed:    how much to take  when to take this   cinacalcet 90 MG tablet Commonly known as:  SENSIPAR Take 90 mg by mouth daily.   ferric gluconate 125 mg in sodium chloride 0.9 % 100 mL Inject 125 mg into the vein Every Tuesday,Thursday,and Saturday with dialysis.   gabapentin 100 MG capsule Commonly known as:  NEURONTIN Take 1 capsule (100 mg total) by mouth every dialysis.   insulin glargine 100 unit/mL Sopn Commonly known as:  LANTUS Inject 0.3 mLs (30 Units total) into the skin at bedtime. What changed:  how much to take   insulin lispro 100 UNIT/ML KiwkPen Commonly known as:  HUMALOG Please inject 10 units, twice daily, ~10 min before your two largest meals of the day. What  changed:    how much to take  how to take this  when to take this  additional instructions   linagliptin 5 MG Tabs tablet Commonly known as:  TRADJENTA Take 1 tablet (5 mg total) by mouth daily.   metoprolol succinate 100 MG 24 hr tablet Commonly known as:  TOPROL-XL TAKE 1 TABLET BY MOUTH ONCE DAILY TAKE WITH OR IMMEDIATELY FOLLOWING A MEAL What changed:    how much to take  how to take this  when to take this  additional instructions   nitroGLYCERIN 0.4 MG SL tablet Commonly known as:  NITROSTAT Place 1 tablet (0.4 mg total) under the tongue every 5 (five) minutes as needed for chest pain.   pantoprazole 40 MG tablet Commonly known as:  PROTONIX Take 2 tablets (80 mg total) by mouth daily. What changed:    how much to take  when to take this   polycarbophil 625 MG tablet Commonly known as:  FIBERCON Take 1 tablet (625 mg total) by mouth daily.   ranolazine 500 MG 12 hr tablet Commonly known as:  RANEXA Take 1 tablet (500 mg total) by mouth 2 (two) times daily.   rosuvastatin 20 MG tablet Commonly known as:  CRESTOR Take 0.5 tablets (10 mg total) by mouth daily. What changed:  See the new instructions.   warfarin 7.5 MG tablet Commonly known as:  COUMADIN Take as directed. If you are unsure how to  take this medication, talk to your nurse or doctor. Original instructions:  TAKE 1/2 TABLET BY MOUTH DAILY, EXCEPT 1 TABLET ON Monday, Wednesday, AND Saturday OR AS DIRECTED BY ANTICOAGULATION CLINIC. What changed:    how much to take  how to take this  when to take this  additional instructions       Disposition and follow-up:   Thomas [name] was discharged from [facility] in Good condition.  At the hospital follow up visit please address:  1.  Stable angina - Discharged with nitroglycerin PRN for chest pain - Started ranolazine 500mg  BID for chest pain - Patient has prolonged QT at baseline. Please repeat EKG on f/u for QT  prolongation.  2. ESRD - Dry weight lowered to 122kg to avoid volume overload between HD sessions  3.  Labs / imaging needed at time of follow-up: EKG  4.  Pending labs/ test needing follow-up: none  Follow-up Appointments: Follow-up Information    [name], MD Follow up.   Specialty:  Internal Medicine Contact information: [place] [address] [phone]        Sueanne Margarita, MD .   Specialty:  Cardiology Contact information: [phone] [address] [address] [phone]           Hospital Course by problem list: Thomas [name] is a 88 yomale with CAD, ESRD on HD TuThSa,Afibon warfarin and amio, HIV, DMII, HTN, HLD, CHF (EF35-40%), bilateral BKA,andPADwho presentedwith chest painand newdyspnea, orthopnea,andPND.CXR showedpulmonary edema. He had no missed dialysis sessions recently. He was admitted for further evaluation and management.  1. Chest pain: Likely 2/2 known CAD. Last LHC two days prior to this admission. No stenting or intervention. Cards recommended continued medical management. Discussed initiation of ranolazine for better anginal control with his cardiologist Dr. [name]. Started ranolazine 500mg  BID. His QT is prolonged at baseline. QT at discharge was 535, which is around his baseline. Also started him on prn nitroglycerin for chest pain. He was continued on metoprolol and crestor. Long acting nitrates were avoided due to patient's history of hypotension with HD. He was advised to f/u with his cardiologist and PCP.   2. Dyspnea 2/2 volume overload in the setting of ESRD and HFrEF: Patient initially required supplemental oxygen for dyspnea, but after he was dialyzed, he was satting well on room air. Dyspnea was 2/2 pulmonary edema. He had not missed any HD sessions, he has been compliant with his medications, and he denied dietary indiscretions. His dry weight was decreased to 122kg to avoid fluid accumulation  between HD sessions. He will resume his normal HD schedule upon discharge.  Discharge Vitals:   BP 121/86   Pulse 82   Temp 98.1 F (36.7 C) (Oral)   Resp 20   Wt 126.4 kg   SpO2 91%   BMI 33.04 kg/m   Pertinent Labs, Studies, and Procedures:  CBC Latest Ref Rng & Units 01/28/2019 01/28/2019 01/26/2019  WBC 4.0 - 10.5 K/uL - 8.4 7.6  Hemoglobin 13.0 - 17.0 g/dL 8.8(L) 9.3(L) 9.2(L)  Hematocrit 39.0 - 52.0 % 26.0(L) 29.4(L) 28.6(L)  Platelets 150 - 400 K/uL - 152 157   CMP Latest Ref Rng & Units 01/28/2019 01/28/2019 01/26/2019  Glucose 70 - 99 mg/dL - 292(H) 228(H)  BUN 6 - 20 mg/dL - 52(H) 68(H)  Creatinine 0.61 - 1.24 mg/dL - 10.58(H) 11.94(H)  Sodium 135 - 145 mmol/L 133(L) 134(L) 133(L)  Potassium 3.5 - 5.1 mmol/L 4.4  4.3 4.5  Chloride 98 - 111 mmol/L - 96(L) 94(L)  CO2 22 - 32 mmol/L - 22 21(L)  Calcium 8.9 - 10.3 mg/dL - 8.6(L) 8.5(L)  Total Protein 6.5 - 8.1 g/dL - - -  Total Bilirubin 0.3 - 1.2 mg/dL - - -  Alkaline Phos 38 - 126 U/L - - -  AST 15 - 41 U/L - - -  ALT 0 - 44 U/L - - -   CT Head 1. No acute finding. 2. Prominent atherosclerotic calcification for age. Remote basal ganglia infarcts.  Discharge Instructions: Discharge Instructions    Diet - low sodium heart healthy   Complete by:  As directed    Discharge instructions   Complete by:  As directed    It was a pleasure taking care of you while you were in the hospital, Mr. Solly!  1. You were treated because you were retaining fluid which made you short of breath. You were treated with dialysis. The kidney doctors have decreased your dry weight to prevent this in the future.  2. We have made two medication changes for your chest pain - Start taking ranolazine 500mg  twice a day - You can also take nitroglycerin as needed when you experience chest pain   3. Please follow-up with your cardiologist and your kidney doctor  Feel free to call our clinic at [phone] if you have any  questions.  Thanks! Deb   Increase activity slowly   Complete by:  As directed       Signed: Lyndle Pang, Andree Elk, MD 01/29/2019, 11:54 AM   Pager: [phone]

## 2019-02-03 ENCOUNTER — Ambulatory Visit (INDEPENDENT_AMBULATORY_CARE_PROVIDER_SITE_OTHER): Payer: Medicare Other | Admitting: Pharmacist

## 2019-02-03 ENCOUNTER — Ambulatory Visit: Payer: Medicare Other

## 2019-02-03 DIAGNOSIS — I200 Unstable angina: Secondary | ICD-10-CM | POA: Diagnosis not present

## 2019-02-03 DIAGNOSIS — E785 Hyperlipidemia, unspecified: Secondary | ICD-10-CM | POA: Diagnosis not present

## 2019-02-03 DIAGNOSIS — Z5181 Encounter for therapeutic drug level monitoring: Secondary | ICD-10-CM

## 2019-02-03 LAB — POCT INR: INR: 2.8 (ref 2.0–3.0)

## 2019-02-03 MED ORDER — OMEGA-3-ACID ETHYL ESTERS 1 G PO CAPS: 2.0000 g | ORAL_CAPSULE | Freq: Two times a day (BID) | ORAL | 11 refills | Status: AC

## 2019-02-03 MED ORDER — ICOSAPENT ETHYL 1 G PO CAPS: 2.0000 g | ORAL_CAPSULE | Freq: Two times a day (BID) | ORAL | 11 refills | Status: DC

## 2019-02-03 NOTE — Patient Instructions (Addendum)
Start taking Lovaza - prescription fish oil 2 grams twice a day  Continue taking your rosuvastatin 10mg  once daily  Try to limit your intake of saturated fats (avoiding red meat, fried food, fast food)  Try to decrease your intake of sugar and carbs. Eat more whole grain carbs (whole wheat pasta, brown rice, and avoid white products). This will help with your triglycerides.   Follow up with your primary care doctor about your insulin - controlling your blood sugar will help with your triglycerides as well  Recheck fasting cholesterol in 3 months on Monday May 4. Come in any time after 7:30am for fasting lab work

## 2019-02-03 NOTE — Patient Instructions (Signed)
Description   Continue taking 1/2 tablet daily except 1 tablet on Mondays, Wednesdays, and Saturdays.  Recheck INR in 4 weeks.  Coumadin Clinic [phone] Main [phone]

## 2019-02-03 NOTE — Progress Notes (Signed)
Patient ID: Thomas [name]                 DOB: 1966-08-19                    MRN: [id number]     HPI: Thomas [name] is a 53 y.o. male patient of Dr [name] referred to lipid clinic by Rosaria Ferries, PA. PMH is significant for CHF with LVEF 40-45%, CAD, unstable angina, DM2, ESRD on HD secondary to uncontrolled DM, HTN, HLD with elevated TG, bilateral BKA, HIV, and PAD. He was last seen in clinic by Dr [name] on 01/22/19 and was admitted that day due to chest pain. His rosuvastatin was also increased to 40mg  daily in clinic. Upon hospital discharge, he was started on Ranexa for chest pain and rosuvastatin was decreased to 10mg  daily due to renal dysfunction. ACE/ARB has been recommended by cardiology and internal med, however decision has been deferred to nephrology due to hypotension during dialysis. He presents to lipid clinic for management of elevated triglycerides.  Pt presents today in good spirits. Reports tolerating his rosuvastatin well. Denies alcohol use. A1c most recently 11.9 - discussed that poorly controlled DM is a large contributing factor to elevated TG. He has [employer] D insurance. Reports some issues with the cost of his insulin and needles. He was recently started on Tradjenta for his diabetes.  Current Medications: rosuvastatin 10mg  daily Risk Factors: ASCVD, DM, HTN, PAD, CKD LDL goal: 70mg /dL, TG goal < 150mg /dL  Diet: Spaghetti, fish, rice/beans, chicken, occasionally a burger or steak  Exercise: Minimal due to bilateral BKA.  Family History: The patient's family history includes Diabetes in his mother; Heart disease in his father and mother. There is no history of Cancer.  Social History: Denies tobacco, alcohol, and illicit drug use.   Labs: 01/23/19: TC 89, TG 409, HDL 16, LDL not calculable, A1c 11.9 (rosuvastatin 20mg  daily) 06/18/18: TC 202, TG 596, HDL 20, LDL not calculable  Past Medical History:  Diagnosis Date  . Anemia   . Atrial  fibrillation ([place])   . CAD (coronary artery disease), native coronary artery 01/22/2019  . Chest pain with moderate risk for cardiac etiology 06/15/2018  . Chronic combined systolic and diastolic CHF (congestive heart failure) ([place])   . Diabetic foot ulcer ([place]) 09/03/2017  . Diabetic wet gangrene of the foot ([place]) 09/04/2017  . ESRD (end stage renal disease) on dialysis [facility])    [facility] [address] [place], [place] (10/15/2018)  . Headache    "monthly" (06/15/2018)  . HIV disease ([place])   . Hyperlipidemia LDL goal <70 01/22/2019  . Hyperparathyroidism, secondary ([place])   . Hypertension   . Hypertensive heart disease with end stage renal disease on dialysis ([place]) 03/13/2017  . Mallory-Weiss tear 2018  . Pneumonia X 1  . Subacute osteomyelitis, right ankle and foot ([place])   . SVT (supraventricular tachycardia) ([place])    ? afib or atrial flutter s/p TEE/DCCV with subsequent ablation due to reoccurrence in [place]  . Type 2 diabetes mellitus ([place])     Current Outpatient Medications on File Prior to Visit  Medication Sig Dispense Refill  . acetaminophen (TYLENOL) 325 MG tablet Take 650 mg by mouth every 6 (six) hours as needed (for headaches).     Marland Kitchen amiodarone (PACERONE) 200 MG tablet Take 1 tablet (200 mg total) by mouth daily. (Patient taking differently: Take 200 mg by mouth at bedtime. ) 30 tablet 11  .  aspirin 81 MG chewable tablet Chew 1 tablet (81 mg total) by mouth daily. 90 tablet 1  . bictegravir-emtricitabine-tenofovir AF (BIKTARVY) 50-200-25 MG TABS tablet Take 1 tablet by mouth daily. 30 tablet 11  . camphor-menthol (SARNA) lotion Apply topically as needed for itching. (Patient taking differently: Apply 1 application topically daily as needed for itching. ) 222 mL 0  . cinacalcet (SENSIPAR) 90 MG tablet Take 90 mg by mouth daily.  6  . ferric citrate (AURYXIA) 1 GM 210 MG(Fe) tablet Take 420-840 mg by mouth See admin instructions. Take 840 mg by mouth three times a day after meals and 420 mg after  each snack    . ferric gluconate 125 mg in sodium chloride 0.9 % 100 mL Inject 125 mg into the vein Every Tuesday,Thursday,and Saturday with dialysis. (Patient not taking: Reported on 01/22/2019)    . gabapentin (NEURONTIN) 100 MG capsule Take 1 capsule (100 mg total) by mouth every dialysis. 15 capsule 1  . insulin glargine (LANTUS) 100 unit/mL SOPN Inject 0.3 mLs (30 Units total) into the skin at bedtime. (Patient taking differently: Inject 20 Units into the skin at bedtime. ) 15 mL 11  . insulin lispro (HUMALOG) 100 UNIT/ML KiwkPen Please inject 10 units, twice daily, ~10 min before your two largest meals of the day. (Patient taking differently: Inject 10 Units into the skin See admin instructions. Inject 10 units into the skin two times a day- 10 minutes before the 2 largest meals of the day) 15 mL 11  . linagliptin (TRADJENTA) 5 MG TABS tablet Take 1 tablet (5 mg total) by mouth daily. 90 tablet 1  . metoprolol succinate (TOPROL-XL) 100 MG 24 hr tablet TAKE 1 TABLET BY MOUTH ONCE DAILY TAKE WITH OR IMMEDIATELY FOLLOWING A MEAL (Patient taking differently: Take 100 mg by mouth daily. TAKE WITH OR IMMEDIATELY FOLLOWING A MEAL) 90 tablet 1  . nitroGLYCERIN (NITROSTAT) 0.4 MG SL tablet Place 1 tablet (0.4 mg total) under the tongue every 5 (five) minutes as needed for chest pain. 30 tablet 0  . pantoprazole (PROTONIX) 40 MG tablet Take 2 tablets (80 mg total) by mouth daily. (Patient taking differently: Take 40 mg by mouth 2 (two) times daily. ) 60 tablet 1  . polycarbophil (FIBERCON) 625 MG tablet Take 1 tablet (625 mg total) by mouth daily. 30 tablet 0  . ranolazine (RANEXA) 500 MG 12 hr tablet Take 1 tablet (500 mg total) by mouth 2 (two) times daily. 60 tablet 0  . rosuvastatin (CRESTOR) 20 MG tablet Take 0.5 tablets (10 mg total) by mouth daily. 90 tablet 1  . warfarin (COUMADIN) 7.5 MG tablet TAKE 1/2 TABLET BY MOUTH DAILY, EXCEPT 1 TABLET ON Monday, Wednesday, AND Saturday OR AS DIRECTED BY  ANTICOAGULATION CLINIC. (Patient taking differently: Take 3.75-7.5 mg by mouth at bedtime. Take 3.75 mg by mouth at bedtime on Sun/Tues/Thurs/Fri and 7.5 mg on Mon/Wed/Sat) 90 tablet 0   No current facility-administered medications on file prior to visit.     Allergies  Allergen Reactions  . Oxycodone-Acetaminophen Other (See Comments)    Hallucinations/delirum    Assessment/Plan:  1. Hyperlipidemia - TG above goal < 150 secondary to poorly controlled diabetes. A1c was most recently 11.9 (and even likely higher as A1c readings in dialysis tend to be falsely low). Encouraged pt to follow up with PCP regarding insulin adjustments. Options are limited for TG lowering medications as fibrates are contraindicated in dialysis patients, and Vascepa is cost prohibitive at $160 per month.  Will start Lovaza 2g BID as this is affordable at $10 per month. He is taking rosuvastatin 10mg  daily as well - this is the highest recommended dose in dialysis patients. Will continue therapy and add on direct LDL to future lipid panel as LDL has not been able to be calculated on the past few lipid panels due to elevated TG. Discussed diet low in saturated fats and white carbs/sugar. Will recheck lipids in 3 months.   Megan E. Supple, PharmD, BCACP, [place] [address] [place], [place] [address] Phone: [phone]; Fax: [phone] 02/03/2019 10:22 AM

## 2019-02-04 ENCOUNTER — Encounter: Payer: Medicare Other | Admitting: Internal Medicine

## 2019-02-05 ENCOUNTER — Ambulatory Visit: Payer: Medicare Other

## 2019-02-08 ENCOUNTER — Ambulatory Visit (HOSPITAL_COMMUNITY)
Admission: RE | Admit: 2019-02-08 | Discharge: 2019-02-08 | Disposition: A | Discharge status: Home / Self Care | Payer: Medicare Other | Source: Ambulatory Visit | Attending: Internal Medicine | Admitting: Internal Medicine

## 2019-02-08 ENCOUNTER — Ambulatory Visit (HOSPITAL_COMMUNITY)
Admission: RE | Admit: 2019-02-08 | Discharge: 2019-02-08 | Disposition: A | Discharge status: Home / Self Care | Payer: Medicare Other | Source: Ambulatory Visit | Attending: Family Medicine | Admitting: Family Medicine

## 2019-02-08 ENCOUNTER — Ambulatory Visit (INDEPENDENT_AMBULATORY_CARE_PROVIDER_SITE_OTHER): Payer: Medicare Other | Admitting: Internal Medicine

## 2019-02-08 ENCOUNTER — Ambulatory Visit (HOSPITAL_COMMUNITY): Admission: RE | Admit: 2019-02-08 | Payer: Medicare Other | Source: Ambulatory Visit

## 2019-02-08 ENCOUNTER — Other Ambulatory Visit: Payer: Self-pay

## 2019-02-08 ENCOUNTER — Other Ambulatory Visit: Payer: Self-pay | Admitting: Pharmacist

## 2019-02-08 ENCOUNTER — Encounter: Payer: Self-pay | Admitting: Internal Medicine

## 2019-02-08 VITALS — BP 149/81 | HR 73 | Temp 98.4°F | Wt 293.2 lb

## 2019-02-08 DIAGNOSIS — M79603 Pain in arm, unspecified: Secondary | ICD-10-CM | POA: Diagnosis not present

## 2019-02-08 DIAGNOSIS — I200 Unstable angina: Secondary | ICD-10-CM | POA: Diagnosis not present

## 2019-02-08 DIAGNOSIS — Z89612 Acquired absence of left leg above knee: Secondary | ICD-10-CM | POA: Diagnosis not present

## 2019-02-08 DIAGNOSIS — Z89611 Acquired absence of right leg above knee: Secondary | ICD-10-CM | POA: Diagnosis not present

## 2019-02-08 DIAGNOSIS — I208 Other forms of angina pectoris: Secondary | ICD-10-CM

## 2019-02-08 DIAGNOSIS — M79644 Pain in right finger(s): Secondary | ICD-10-CM

## 2019-02-08 DIAGNOSIS — G4700 Insomnia, unspecified: Secondary | ICD-10-CM | POA: Diagnosis present

## 2019-02-08 DIAGNOSIS — R9431 Abnormal electrocardiogram [ECG] [EKG]: Secondary | ICD-10-CM | POA: Diagnosis not present

## 2019-02-08 DIAGNOSIS — R200 Anesthesia of skin: Secondary | ICD-10-CM | POA: Diagnosis not present

## 2019-02-08 DIAGNOSIS — N186 End stage renal disease: Secondary | ICD-10-CM

## 2019-02-08 DIAGNOSIS — Z992 Dependence on renal dialysis: Secondary | ICD-10-CM | POA: Diagnosis not present

## 2019-02-08 DIAGNOSIS — L989 Disorder of the skin and subcutaneous tissue, unspecified: Secondary | ICD-10-CM | POA: Diagnosis not present

## 2019-02-08 DIAGNOSIS — B20 Human immunodeficiency virus [HIV] disease: Secondary | ICD-10-CM

## 2019-02-08 MED ORDER — EUCERIN EX CREA: TOPICAL_CREAM | CUTANEOUS | 0 refills | Status: AC | PRN

## 2019-02-08 NOTE — Assessment & Plan Note (Addendum)
Patient presents complaining of bilateral hand numbness that started about 1 month ago.  No lower extremity symptoms.  He has bilateral above-the-knee amputations but has not felt any symptoms at the stumps.  He appears to be on gabapentin 100 mg after every dialysis but was unaware of this and is actually not sure that he is taking this medication.  It is possible this may be related to uncontrolled diabetes given A1c of 11.9 in a patient with ESRD.  Recent TSH was normal.  Will check B12 today and continue gabapentin pending further work-up.

## 2019-02-08 NOTE — Assessment & Plan Note (Signed)
Mr. flury presented for hospital follow-up.  He was admitted 1/24-1/28 and 1/30-1/31 for recurrent chest pain that was thought to be cardiac in nature. He underwent LHC on 1/27 that showed severe 2-vessel, obstructive disease though unchanged from previous LHC on 05/2018. He was started on Ranexa 500 mg BID for anginal pain. He reports compliance with this and denies chest pain since discharged from the hospital. Has overall being doing well except for other complaints documented in a separate A&P.  During his admission he was noted to have a prolonged QTC of 535 MS which is his baseline.  Repeat EKG today showed prolongation of all intervals with QTC of 526ms.  He is on amiodarone and Protonix which can increase QT interval, but with most recent addition of Ranexa to his regimen this is likely the cause of the worsening in QT prolongation. - Hold Ranexa until 2/12 when he will follow up with Cardiology  - Avoid any other QT prolonging medications  - Last Mag 2.1 on 1/24, will check today

## 2019-02-08 NOTE — Patient Instructions (Addendum)
[name],   Do not take Ranexa until your appointment with the cardiologist in 2 days.   For the pain in your finger, we ordered an x ray. For the lesion in your finger we recommend a hydrating cream. I sent a prescription for oneto your pharmacy, but you can also get one over-the-counter.  For your issues sleeping I have ordered a sleep study.  Please call us if you have any questions or concerns  - Dr. Frederico Hamman

## 2019-02-08 NOTE — Progress Notes (Signed)
   CC: Hospital follow-up for chest pain, finger pain, neuropathy, and insomnia  HPI:  [name] is a 53 y.o. year-old male with PMH listed below who presents to clinic for hospital follow-up for chest pain, finger pain, neuropathy, and insomnia. Please see problem based assessment and plan for further details.   Past Medical History:  Diagnosis Date  . Anemia   . Atrial fibrillation ([place])   . CAD (coronary artery disease), native coronary artery 01/22/2019  . Chest pain with moderate risk for cardiac etiology 06/15/2018  . Chronic combined systolic and diastolic CHF (congestive heart failure) ([place])   . Diabetic foot ulcer ([place]) 09/03/2017  . Diabetic wet gangrene of the foot ([place]) 09/04/2017  . ESRD (end stage renal disease) on dialysis [facility])    [place] [address] [place], [place] (10/15/2018)  . Headache    "monthly" (06/15/2018)  . HIV disease ([place])   . Hyperlipidemia LDL goal <70 01/22/2019  . Hyperparathyroidism, secondary ([place])   . Hypertension   . Hypertensive heart disease with end stage renal disease on dialysis ([place]) 03/13/2017  . Mallory-Weiss tear 2018  . Pneumonia X 1  . Subacute osteomyelitis, right ankle and foot ([place])   . SVT (supraventricular tachycardia) ([place])    ? afib or atrial flutter s/p TEE/DCCV with subsequent ablation due to reoccurrence in [place]  . Type 2 diabetes mellitus ([place])    Review of Systems:   Review of Systems  Constitutional: Negative for chills, fever and malaise/fatigue.  Respiratory: Negative for shortness of breath.   Cardiovascular: Negative for chest pain, palpitations and leg swelling.  Musculoskeletal: Positive for joint pain.  Neurological: Positive for sensory change. Negative for dizziness, focal weakness and headaches.  Psychiatric/Behavioral: The patient has insomnia. The patient is not nervous/anxious.     Physical Exam: Vitals:   02/08/19 1353  BP: (!) 149/81  Pulse: 73  Temp: 98.4 F (36.9 C)  TempSrc: Oral  SpO2: 93%    Weight: 293 lb 3.2 oz (133 kg)    General: Male with flat affect, well-appearing, in no acute distress Cardiac: regular rate and rhythm, nl S1/S2, no murmurs, rubs or gallops, no JVD  Pulm: CTAB, no wheezes or crackles, no increased work of breathing on room air  Neuro: Bilateral hand numbness with normal strength bilaterally Ext: Bilateral amputee Derm: Hyperkeratotic lesion at the lateral nail bed of right third finger without associated erythema or warmth, no discoloration or bruising noted    Assessment & Plan:   See Encounters Tab for problem based charting.  Patient discussed with Dr. Angelia Mould

## 2019-02-08 NOTE — Assessment & Plan Note (Signed)
Baseline QTc 530s while on amiodarone and Protonix. He was started on Ranexa 10 days ago for refractory angina and QTC now 559 ms along with prolongation of PR and QRS.  Holding Ranexa until he sees cardiologist on 2/12 and avoiding any QT prolonging medications. Cheking Mag.  CT chest pain assessment and plan for further details.

## 2019-02-08 NOTE — Assessment & Plan Note (Signed)
Patient complains of insomnia for the past month.  He reports he has difficulty falling asleep and  usually goes to bed at 1AM.  He is however able to sleep through the night and usually wakes up at 10 AM.  Denies exercise and caffeine intake prior to bedtime.  He does take naps during the day.  He reports bringing this up with his nephrologist who recommended over-the-counter melatonin.  He has been taking 10 mg of this on a daily basis which has not helped.  He reports snoring at night, but unsure if he is having apneic events through the night. He is requesting medication for insomnia.  He is sleeping 8 to 9 hours every day.  We discussed importance of sleep hygiene and avoiding naps throughout the day as this is most likely the cause of his difficulty sleeping.  I will also order a sleep study to evaluate for sleep apnea as he is at high risk (STOP BANG 6).

## 2019-02-08 NOTE — Assessment & Plan Note (Addendum)
Patient presents complaining of acute onset of pain in his right third finger.  He reports he has difficulty bending it but has not noticed any weakness.  He also endorses numbness (see pain and numbness assessment and plan).  He has a hyperkeratotic lesion on his finger on the lateral nail bed but otherwise no other rashes or lesions or signs of infection. I recommended a hydrating cream or ointment.  I also ordered a x-ray of his right hand. Will continue to monitor.  Please see assessment and plan for pain and numbness of upper extremities for further details.

## 2019-02-09 ENCOUNTER — Encounter: Payer: Self-pay | Admitting: Physician Assistant

## 2019-02-09 ENCOUNTER — Other Ambulatory Visit: Payer: Self-pay | Admitting: Internal Medicine

## 2019-02-09 LAB — MAGNESIUM: Magnesium: 2.3 mg/dL (ref 1.6–2.3)

## 2019-02-09 LAB — VITAMIN B12: Vitamin B-12: 769 pg/mL (ref 232–1245)

## 2019-02-09 NOTE — Telephone Encounter (Signed)
Pt wife said she called the pharmacy to check to see if the hand cream was there, pharmacy said they dont have an order. Pt was here on yesterday; [facility] ([place]), [place]; pt contact [phone]

## 2019-02-09 NOTE — Telephone Encounter (Signed)
Returned call-informed pt's wife that MD sent in order for Eucerin, but since it's available otc, insurance will not likely pay for it. Wife will pick up otc.  No further action needed, phone call complete.Despina Hidden Cassady2/11/20202:28 PM

## 2019-02-09 NOTE — Progress Notes (Deleted)
Cardiology Office Note    Date:  02/09/2019  ID:  Thomas [name], DOB [birth year], MRN [id number] PCP:  [name], MD  Cardiologist:  Fransico Him, MD   Chief Complaint: f/u CAD, CHF  History of Present Illness:  Thomas [name] is a 53 y.o. male with history of HIV, DM, ESRD on HD, HTN, paroxysmal atrial fib/flutter, L BKA, secondary hyperparathyroidism, anemia, 72m ascending aortic aneurysm by echo 12/2018, CAD (treated medically), chronic combined CHF, QT prolongation, Mallory Weiss tear who presents for f/u.  He established care with Dr. TRadford Pax3/2018. [place] had some type of SVT and had what sounds like from his description as afib or flutter and had a TEE/DCCV and about 2 weeks later had reoccurrence of the arrhythmia and had an ablation. He has since been on chronic anticoagulation with warfarin. He was then seen our hospital system 04/2017 for chest pain and acute heart failure. Troponin was elevated in flat trend up to peak 1.07 felt related to CHF/CKD. 2D echo at that time showed mod LVH, EF 35-40%, akinesis of the inferior myocardium, grade 2 DD. I met him in 11/2017 when he arrived for pre-op clearance for R foot ulcer, but was actually noted to be febrile and tachycardic so was sent to ER for sepsis. He ultimately required transtibial amputation of right lower extremity for osteomyelitis that admission. In 12/2017 he was admitted with symptomatic anemia requiring 5 units of PRBC with EGD showing Mallory-Weiss tear. He had several admissions over the summer (May and June 2019) for recurrent atrial fib as well as atrial flutter. He was started on amiodarone but has had a persistently elevated QTC in the 520 range. LHC was performed 2019 showing severe 2V CAD, treated medically. Most recently he was admitted 1/24-1/28/20 with recurrent chest pain and mildly elevated troponin felt related to recurrent atrial flutter. His cath was unchanged from prior study with severe  2v CAD with 80% mLAD, 99% dLAD and D# and occluded D1/PLOM and 95% PL, 90% RV marginal. The the only lesion amenable to PCI was the mLAD which would be complex PCI with atherectomy and stent and would require DAPT and coumadin increasing bleeding risk. The treatment of LAD was not felt likely to add to overall prognosis and medical therapy was recommended. Hypotension has limited his HF med titration. Last echo 01/24/19 showed EF 40%, moderate LVH with restrictive filling, mild AI; mildly dilated aortic root/ascending aorta; mild MR; severe LAE; mild TR with mild pulmonary hypertension. Last labs 12/2018 showed K 4.3, Hgb 9.3, trig 409, TSH wnl, LDL unable to calculate. His lipids have been followed by our pharmD lipid clinic.     cbc aneurysm  CAD Chronic combined CHF Paroxysmal atrial fib/flutter Ascending aortic aneurysm       Past Medical History:  Diagnosis Date  . Anemia   . Ascending aortic aneurysm ([place]   . CAD (coronary artery disease), native coronary artery 01/22/2019  . Chronic combined systolic and diastolic CHF (congestive heart failure) ([place]   . Diabetic foot ulcer ([place] 09/03/2017  . Diabetic wet gangrene of the foot ([place] 09/04/2017  . ESRD (end stage renal disease) on dialysis ([facility]    [place] [address], [place], [place](10/15/2018)  . Headache    "monthly" (06/15/2018)  . HIV disease ([place]   . Hyperlipidemia LDL goal <70 01/22/2019  . Hyperparathyroidism, secondary ([place]   . Hypertension   . Hypertensive heart disease with end stage renal disease  on dialysis ([place]) 03/13/2017  . Mallory-Weiss tear 2018  . PAF (paroxysmal atrial fibrillation) ([place])   . Paroxysmal atrial flutter ([place])   . Pneumonia X 1  . QT prolongation   . Subacute osteomyelitis, right ankle and foot ([place])   . SVT (supraventricular tachycardia) ([place])    ? afib or atrial flutter s/p TEE/DCCV with subsequent ablation due to reoccurrence in [place]  . Type 2 diabetes mellitus ([place])     Past Surgical  History:  Procedure Laterality Date  . AMPUTATION Left    foot  . AMPUTATION Right 12/06/2017   Procedure: AMPUTATION BELOW KNEE;  Surgeon: [name], MD;  Location: [place];  Service: Orthopedics;  Laterality: Right;  . APPLICATION OF WOUND VAC Right 09/04/2017   Procedure: APPLICATION OF WOUND VAC;  Surgeon: [name], DPM;  Location: [place];  Service: Podiatry;  Laterality: Right;  . APPLICATION OF WOUND VAC  12/06/2017   Procedure: APPLICATION OF WOUND VAC;  Surgeon: [name], MD;  Location: [place];  Service: Orthopedics;;  . AV FISTULA PLACEMENT Right 10/19/2012  . AV FISTULA REPAIR Right ~ 02/2018   "had it cleaned out"  . BELOW KNEE LEG AMPUTATION Left ~ 2016  . COLONOSCOPY WITH PROPOFOL N/A 07/30/2017   Procedure: COLONOSCOPY WITH PROPOFOL;  Surgeon: [name], MD;  Location: [place];  Service: Endoscopy;  Laterality: N/A;  . ESOPHAGOGASTRODUODENOSCOPY (EGD) WITH PROPOFOL N/A 07/28/2017   Procedure: ESOPHAGOGASTRODUODENOSCOPY (EGD) WITH PROPOFOL;  Surgeon: [name], MD;  Location: [place];  Service: Endoscopy;  Laterality: N/A;  . ESOPHAGOGASTRODUODENOSCOPY (EGD) WITH PROPOFOL N/A 01/15/2018   Procedure: ESOPHAGOGASTRODUODENOSCOPY (EGD) WITH PROPOFOL;  Surgeon: [name], MD;  Location[facility] ENDOSCOPY;  Service: Endoscopy;  Laterality: N/A;  . FLEXIBLE SIGMOIDOSCOPY N/A 07/27/2017   Procedure: FLEXIBLE SIGMOIDOSCOPY;  Surgeon: [name], MD;  Location: [facility] ENDOSCOPY;  Service: Endoscopy;  Laterality: N/A;  . GRAFT APPLICATION Right 91/79/1505   Procedure: SKIN GRAFT APPLICATION RIGHT FOOT;  Surgeon: [name], DPM;  Location: [place];  Service: Podiatry;  Laterality: Right;  . I&D EXTREMITY Right 09/04/2017   Procedure: IRRIGATION AND DEBRIDEMENT EXTREMITY;  Surgeon: [name], DPM;  Location: [place];  Service: Podiatry;  Laterality: Right;  . I&D EXTREMITY Right 11/12/2017   Procedure: IRRIGATION AND DEBRIDEMENT ULCER RIGHT FOOT;   Surgeon: [name], DPM;  Location: [place];  Service: Podiatry;  Laterality: Right;  . LEFT HEART CATH AND CORONARY ANGIOGRAPHY N/A 06/18/2018   Procedure: LEFT HEART CATH AND CORONARY ANGIOGRAPHY;  Surgeon: [name], Peter M, MD;  Location: [place] [facility];  Service: Cardiovascular;  Laterality: N/A;  . LEFT HEART CATH AND CORONARY ANGIOGRAPHY N/A 01/25/2019   Procedure: LEFT HEART CATH AND CORONARY ANGIOGRAPHY;  Surgeon: [name], Peter M, MD;  Location: [place] [facility];  Service: Cardiovascular;  Laterality: N/A;  . WOUND DEBRIDEMENT N/A 09/24/2017   Procedure: DEBRIDEMENT WOUND;  Surgeon: [name], DPM;  Location: [place];  Service: Podiatry;  Laterality: N/A;    Current Medications: No outpatient medications have been marked as taking for the 02/10/19 encounter (Appointment) with Charlie Pitter, PA-C.   ***   Allergies:   Oxycodone-acetaminophen   Social History   Socioeconomic History  . Marital status: Married    Spouse name: kim  . Number of children: 3  . Years of education: college  . Highest education level: Not on file  Occupational History  . Occupation: disabled  Social Needs  .  Financial resource strain: Not on file  . Food insecurity:    Worry: Not on file    Inability: Not on file  . Transportation needs:    Medical: Not on file    Non-medical: Not on file  Tobacco Use  . Smoking status: Never Smoker  . Smokeless tobacco: Never Used  Substance and Sexual Activity  . Alcohol use: Never    Frequency: Never  . Drug use: Never  . Sexual activity: Not Currently  Lifestyle  . Physical activity:    Days per week: Not on file    Minutes per session: Not on file  . Stress: Not on file  Relationships  . Social connections:    Talks on phone: Not on file    Gets together: Not on file    Attends religious service: Not on file    Active member of club or organization: Not on file    Attends meetings of clubs or organizations: Not on file     Relationship status: Not on file  Other Topics Concern  . Not on file  Social History Narrative   Lives in [place] with wife.     Family History:  The patient's ***family history includes Diabetes in his mother; Heart disease in his father and mother. There is no history of Cancer.  ROS:   Please see the history of present illness. Otherwise, review of systems is positive for ***.  All other systems are reviewed and otherwise negative.    PHYSICAL EXAM:   VS:  There were no vitals taken for this visit.  BMI: There is no height or weight on file to calculate BMI. GEN: Well nourished, well developed, in no acute distress HEENT: normocephalic, atraumatic Neck: no JVD, carotid bruits, or masses Cardiac: ***RRR; no murmurs, rubs, or gallops, no edema  Respiratory:  clear to auscultation bilaterally, normal work of breathing GI: soft, nontender, nondistended, + BS MS: no deformity or atrophy Skin: warm and dry, no rash Neuro:  Alert and Oriented x 3, Strength and sensation are intact, follows commands Psych: euthymic mood, full affect  Wt Readings from Last 3 Encounters:  02/08/19 293 lb 3.2 oz (133 kg)  01/29/19 278 lb 10.6 oz (126.4 kg)  01/26/19 274 lb 4 oz (124.4 kg)      Studies/Labs Reviewed:   EKG:  EKG was ordered today and personally reviewed by me and demonstrates *** EKG was not ordered today.***  Recent Labs: 01/22/2019: TSH 1.480 01/23/2019: ALT 22 01/28/2019: BUN 52; Creatinine, Ser 10.58; Hemoglobin 8.8; Platelets 152; Potassium 4.4; Sodium 133 02/08/2019: Magnesium 2.3   Lipid Panel    Component Value Date/Time   CHOL 89 01/23/2019 0314   TRIG 409 (H) 01/23/2019 0314   HDL 16 (L) 01/23/2019 0314   CHOLHDL 5.6 01/23/2019 0314   VLDL UNABLE TO CALCULATE IF TRIGLYCERIDE OVER 400 mg/dL 01/23/2019 0314   LDLCALC UNABLE TO CALCULATE IF TRIGLYCERIDE OVER 400 mg/dL 01/23/2019 0314    Additional studies/ records that were reviewed today include: Summarized  above.***    ASSESSMENT & PLAN:   1. ***  Disposition: F/u with ***   Medication Adjustments/Labs and Tests Ordered: Current medicines are reviewed at length with the patient today.  Concerns regarding medicines are outlined above. Medication changes, Labs and Tests ordered today are summarized above and listed in the Patient Instructions accessible in Encounters.   Signed, Charlie Pitter, PA-C  02/09/2019 1:16 PM    [facility] [address] [place], [place]  [address] Phone: [phone]; Fax: [phone]

## 2019-02-10 ENCOUNTER — Ambulatory Visit: Payer: Medicare Other | Admitting: Physician Assistant

## 2019-02-12 NOTE — Progress Notes (Signed)
Internal Medicine Clinic Attending  Case discussed with Dr. Santos-Sanchez at the time of the visit.  We reviewed the resident's history and exam and pertinent patient test results.  I agree with the assessment, diagnosis, and plan of care documented in the resident's note.    

## 2019-02-15 ENCOUNTER — Telehealth: Payer: Self-pay

## 2019-02-15 DIAGNOSIS — G4700 Insomnia, unspecified: Secondary | ICD-10-CM

## 2019-02-15 MED FILL — BIKTARVY 50-200-25 MG TABS: 50-200-25 | 30 days supply | Qty: 30 | Fill #0

## 2019-02-15 NOTE — Telephone Encounter (Signed)
Pt's wife calling to check on the status of CPAP machine. Please call back.

## 2019-02-15 NOTE — Telephone Encounter (Signed)
There is an order for one placed on 2/10. Do I need to place a different one?

## 2019-02-15 NOTE — Addendum Note (Signed)
Addended by: Marcelino Duster on: 02/15/2019 04:30 PM   Modules accepted: Orders

## 2019-02-15 NOTE — Telephone Encounter (Addendum)
Order changed to "split night"-returned call to pt's wife, no answer-contact info to [facility] [phone] left on recorder.  Per appt notes, sleep center had attempted to contact pt and left message. CMA also left contact info on recorder to assist pt with scheduling appt if needed.Despina Hidden Cassady2/17/20204:29 PM

## 2019-02-21 ENCOUNTER — Inpatient Hospital Stay (HOSPITAL_COMMUNITY)
Admission: EM | Admit: 2019-02-21 | Discharge: 2019-02-24 | DRG: 291 | Disposition: A | Discharge status: Home / Self Care | Payer: Medicare Other | Attending: Internal Medicine | Admitting: Internal Medicine

## 2019-02-21 ENCOUNTER — Other Ambulatory Visit: Payer: Self-pay

## 2019-02-21 ENCOUNTER — Encounter (HOSPITAL_COMMUNITY): Payer: Self-pay

## 2019-02-21 ENCOUNTER — Emergency Department (HOSPITAL_COMMUNITY): Payer: Medicare Other

## 2019-02-21 DIAGNOSIS — G4733 Obstructive sleep apnea (adult) (pediatric): Secondary | ICD-10-CM | POA: Diagnosis present

## 2019-02-21 DIAGNOSIS — E785 Hyperlipidemia, unspecified: Secondary | ICD-10-CM | POA: Diagnosis present

## 2019-02-21 DIAGNOSIS — E1122 Type 2 diabetes mellitus with diabetic chronic kidney disease: Secondary | ICD-10-CM | POA: Diagnosis present

## 2019-02-21 DIAGNOSIS — Z8249 Family history of ischemic heart disease and other diseases of the circulatory system: Secondary | ICD-10-CM

## 2019-02-21 DIAGNOSIS — G4700 Insomnia, unspecified: Secondary | ICD-10-CM | POA: Diagnosis present

## 2019-02-21 DIAGNOSIS — Z7901 Long term (current) use of anticoagulants: Secondary | ICD-10-CM

## 2019-02-21 DIAGNOSIS — Z6831 Body mass index (BMI) 31.0-31.9, adult: Secondary | ICD-10-CM

## 2019-02-21 DIAGNOSIS — D631 Anemia in chronic kidney disease: Secondary | ICD-10-CM | POA: Diagnosis present

## 2019-02-21 DIAGNOSIS — I132 Hypertensive heart and chronic kidney disease with heart failure and with stage 5 chronic kidney disease, or end stage renal disease: Secondary | ICD-10-CM | POA: Diagnosis not present

## 2019-02-21 DIAGNOSIS — N2581 Secondary hyperparathyroidism of renal origin: Secondary | ICD-10-CM | POA: Diagnosis present

## 2019-02-21 DIAGNOSIS — R945 Abnormal results of liver function studies: Secondary | ICD-10-CM

## 2019-02-21 DIAGNOSIS — R0600 Dyspnea, unspecified: Secondary | ICD-10-CM

## 2019-02-21 DIAGNOSIS — R0902 Hypoxemia: Secondary | ICD-10-CM

## 2019-02-21 DIAGNOSIS — R778 Other specified abnormalities of plasma proteins: Secondary | ICD-10-CM

## 2019-02-21 DIAGNOSIS — I480 Paroxysmal atrial fibrillation: Secondary | ICD-10-CM | POA: Diagnosis present

## 2019-02-21 DIAGNOSIS — K219 Gastro-esophageal reflux disease without esophagitis: Secondary | ICD-10-CM | POA: Diagnosis present

## 2019-02-21 DIAGNOSIS — Z794 Long term (current) use of insulin: Secondary | ICD-10-CM

## 2019-02-21 DIAGNOSIS — I712 Thoracic aortic aneurysm, without rupture: Secondary | ICD-10-CM | POA: Diagnosis present

## 2019-02-21 DIAGNOSIS — I2510 Atherosclerotic heart disease of native coronary artery without angina pectoris: Secondary | ICD-10-CM | POA: Diagnosis present

## 2019-02-21 DIAGNOSIS — I252 Old myocardial infarction: Secondary | ICD-10-CM

## 2019-02-21 DIAGNOSIS — Z992 Dependence on renal dialysis: Secondary | ICD-10-CM

## 2019-02-21 DIAGNOSIS — Z89512 Acquired absence of left leg below knee: Secondary | ICD-10-CM

## 2019-02-21 DIAGNOSIS — N186 End stage renal disease: Secondary | ICD-10-CM | POA: Diagnosis present

## 2019-02-21 DIAGNOSIS — R740 Nonspecific elevation of levels of transaminase and lactic acid dehydrogenase [LDH]: Secondary | ICD-10-CM | POA: Diagnosis not present

## 2019-02-21 DIAGNOSIS — R072 Precordial pain: Secondary | ICD-10-CM

## 2019-02-21 DIAGNOSIS — Z833 Family history of diabetes mellitus: Secondary | ICD-10-CM

## 2019-02-21 DIAGNOSIS — I959 Hypotension, unspecified: Secondary | ICD-10-CM | POA: Diagnosis not present

## 2019-02-21 DIAGNOSIS — Z7982 Long term (current) use of aspirin: Secondary | ICD-10-CM

## 2019-02-21 DIAGNOSIS — R7989 Other specified abnormal findings of blood chemistry: Secondary | ICD-10-CM

## 2019-02-21 DIAGNOSIS — E669 Obesity, unspecified: Secondary | ICD-10-CM | POA: Diagnosis present

## 2019-02-21 DIAGNOSIS — Z885 Allergy status to narcotic agent status: Secondary | ICD-10-CM

## 2019-02-21 DIAGNOSIS — I428 Other cardiomyopathies: Secondary | ICD-10-CM | POA: Diagnosis present

## 2019-02-21 DIAGNOSIS — I5042 Chronic combined systolic (congestive) and diastolic (congestive) heart failure: Secondary | ICD-10-CM | POA: Diagnosis present

## 2019-02-21 DIAGNOSIS — Z21 Asymptomatic human immunodeficiency virus [HIV] infection status: Secondary | ICD-10-CM | POA: Diagnosis present

## 2019-02-21 LAB — GLUCOSE, CAPILLARY
Glucose-Capillary: 182 mg/dL — ABNORMAL HIGH (ref 70–99)
Glucose-Capillary: 228 mg/dL — ABNORMAL HIGH (ref 70–99)
Glucose-Capillary: 171 mg/dL — ABNORMAL HIGH (ref 70–99)

## 2019-02-21 LAB — COMPREHENSIVE METABOLIC PANEL
ALT: 56 U/L — ABNORMAL HIGH (ref 0–44)
AST: 66 U/L — AB (ref 15–41)
Albumin: 3.7 g/dL (ref 3.5–5.0)
Alkaline Phosphatase: 96 U/L (ref 38–126)
Anion gap: 14 (ref 5–15)
BUN: 23 mg/dL — ABNORMAL HIGH (ref 6–20)
CO2: 26 mmol/L (ref 22–32)
Calcium: 10.7 mg/dL — ABNORMAL HIGH (ref 8.9–10.3)
Chloride: 96 mmol/L — ABNORMAL LOW (ref 98–111)
Creatinine, Ser: 6.74 mg/dL — ABNORMAL HIGH (ref 0.61–1.24)
GFR calc Af Amer: 10 mL/min — ABNORMAL LOW (ref >60)
GFR calc non Af Amer: 9 mL/min — ABNORMAL LOW (ref >60)
Glucose, Bld: 220 mg/dL — ABNORMAL HIGH (ref 70–99)
Potassium: 3.6 mmol/L (ref 3.5–5.1)
Sodium: 136 mmol/L (ref 135–145)
Total Bilirubin: 2.6 mg/dL — ABNORMAL HIGH (ref 0.3–1.2)
Total Protein: 7.4 g/dL (ref 6.5–8.1)

## 2019-02-21 LAB — CBC
HCT: 31.4 % — ABNORMAL LOW (ref 39.0–52.0)
Hemoglobin: 9.5 g/dL — ABNORMAL LOW (ref 13.0–17.0)
MCH: 30.6 pg (ref 26.0–34.0)
MCHC: 30.3 g/dL (ref 30.0–36.0)
MCV: 101.3 fL — ABNORMAL HIGH (ref 80.0–100.0)
NRBC: 0.5 % — AB (ref 0.0–0.2)
Platelets: 216 10*3/uL (ref 150–400)
RBC: 3.1 MIL/uL — ABNORMAL LOW (ref 4.22–5.81)
RDW: 17.2 % — ABNORMAL HIGH (ref 11.5–15.5)
WBC: 7.6 10*3/uL (ref 4.0–10.5)

## 2019-02-21 LAB — I-STAT TROPONIN, ED: Troponin i, poc: 0.19 ng/mL (ref 0.00–0.08)

## 2019-02-21 LAB — PROTIME-INR
INR: 3.69
Prothrombin Time: 36 seconds — ABNORMAL HIGH (ref 11.4–15.2)

## 2019-02-21 MED ORDER — INSULIN ASPART 100 UNIT/ML ~~LOC~~ SOLN
0.0000 [IU] | Freq: Every day | SUBCUTANEOUS | Status: DC
  Administered 2019-02-23: 2 [IU] via SUBCUTANEOUS

## 2019-02-21 MED ORDER — INSULIN GLARGINE 100 UNIT/ML ~~LOC~~ SOLN
20.0000 [IU] | Freq: Every day | SUBCUTANEOUS | Status: DC
  Administered 2019-02-21 – 2019-02-23 (×3): 20 [IU] via SUBCUTANEOUS
  Filled 2019-02-21 (×5): qty 0.2

## 2019-02-21 MED ORDER — ASPIRIN 81 MG PO CHEW
81.0000 mg | CHEWABLE_TABLET | Freq: Every day | ORAL | Status: DC
  Administered 2019-02-22 – 2019-02-24 (×3): 81 mg via ORAL
  Filled 2019-02-21 (×3): qty 1

## 2019-02-21 MED ORDER — AMIODARONE HCL 200 MG PO TABS
200.0000 mg | ORAL_TABLET | Freq: Every day | ORAL | Status: DC
  Administered 2019-02-21 – 2019-02-23 (×3): 200 mg via ORAL
  Filled 2019-02-21 (×3): qty 1

## 2019-02-21 MED ORDER — WARFARIN - PHARMACIST DOSING INPATIENT
Freq: Every day | Status: DC
  Administered 2019-02-23: 17:00:00

## 2019-02-21 MED ORDER — ACETAMINOPHEN 325 MG PO TABS
650.0000 mg | ORAL_TABLET | Freq: Four times a day (QID) | ORAL | Status: DC | PRN
  Administered 2019-02-21 – 2019-02-23 (×2): 650 mg via ORAL
  Filled 2019-02-21 (×2): qty 2

## 2019-02-21 MED ORDER — INSULIN ASPART 100 UNIT/ML ~~LOC~~ SOLN
0.0000 [IU] | Freq: Three times a day (TID) | SUBCUTANEOUS | Status: DC
  Administered 2019-02-21: 3 [IU] via SUBCUTANEOUS
  Administered 2019-02-22 – 2019-02-23 (×4): 2 [IU] via SUBCUTANEOUS
  Administered 2019-02-24: 5 [IU] via SUBCUTANEOUS
  Administered 2019-02-24: 2 [IU] via SUBCUTANEOUS

## 2019-02-21 MED ORDER — INSULIN ASPART 100 UNIT/ML ~~LOC~~ SOLN
6.0000 [IU] | Freq: Three times a day (TID) | SUBCUTANEOUS | Status: DC
  Administered 2019-02-22 – 2019-02-24 (×5): 6 [IU] via SUBCUTANEOUS

## 2019-02-21 NOTE — Progress Notes (Signed)
ANTICOAGULATION CONSULT NOTE - Initial Consult  Pharmacy Consult for warfarin Indication: atrial fibrillation  Allergies  Allergen Reactions  . Oxycodone-Acetaminophen Other (See Comments)    Hallucinations/delirum    Patient Measurements:  Vital Signs: Temp: 98.4 F (36.9 C) (02/23 1117) BP: 137/90 (02/23 1117) Pulse Rate: 79 (02/23 1117)  Labs: Recent Labs    02/21/19 1130  HGB 9.5*  HCT 31.4*  PLT 216  LABPROT 36.0*  INR 3.69  CREATININE 6.74*    Estimated Creatinine Clearance: 19.3 mL/min (A) (by C-G formula based on SCr of 6.74 mg/dL (H)).   Medical History: Past Medical History:  Diagnosis Date  . Anemia   . Ascending aortic aneurysm ([place])   . CAD (coronary artery disease), native coronary artery 01/22/2019  . Chronic combined systolic and diastolic CHF (congestive heart failure) ([place])   . Diabetic foot ulcer ([place]) 09/03/2017  . Diabetic wet gangrene of the foot ([place]) 09/04/2017  . ESRD (end stage renal disease) on dialysis [facility])    [address], Th, [place] (10/15/2018)  . Headache    "monthly" (06/15/2018)  . HIV disease ([place])   . Hyperlipidemia LDL goal <70 01/22/2019  . Hyperparathyroidism, secondary ([place])   . Hypertension   . Hypertensive heart disease with end stage renal disease on dialysis ([place]) 03/13/2017  . Mallory-Weiss tear 2018  . PAF (paroxysmal atrial fibrillation) ([place])   . Paroxysmal atrial flutter ([place])   . Pneumonia X 1  . QT prolongation   . Subacute osteomyelitis, right ankle and foot ([place])   . SVT (supraventricular tachycardia) ([place])    ? afib or atrial flutter s/p TEE/DCCV with subsequent ablation due to reoccurrence in [place]  . Type 2 diabetes mellitus ([facility])     Assessment: Thomas [name] is a 53 yo male with hx of AFib on warfarin PTA. INR is SUPRAtherapeutic on admission at 3.69. INR was therapeutic on 2/5 at 2.8 on current dosing  PTA dose: warfarin 3.75 mg by mouth at bedtime on Sun/Tues/Thurs/Fri and 7.5 mg on  Mon/Wed/Sat  Goal of Therapy:  Heparin level 0.3-0.7 units/ml Monitor platelets by anticoagulation protocol: Yes   Plan:  Hold warfarin x 1 Check baseline INR Monitor daily INR, CBC, clinical course, s/sx of bleed, PO intake, DDI    Thank you for allowing [place] to participate in this patients care.   Jens Som, PharmD Please utilize [facility] (under [place]) for appropriate number for your unit pharmacist. 02/21/2019 1:27 PM

## 2019-02-21 NOTE — ED Triage Notes (Signed)
Onset 1 1/2 months pt is not able to sleep.  When lays down will get chest pain and short of breath.  Has appt next week for sleep apnea test but is unable to wait until then to be evaluated.  Has not missed any dialysis visits.  SpO2 83-87% in triage.

## 2019-02-21 NOTE — ED Provider Notes (Signed)
[facility] EMERGENCY DEPARTMENT Provider Note   CSN: [id number] Arrival date & time: 02/21/19  1055    History   Chief Complaint Chief Complaint  Patient presents with  . Shortness of Breath    HPI Thomas [name] is a 53 y.o. male.     Patient c/o sob, feeling tired/exhausted, trouble sleeping, pnd, and intermittent chest pain in the past 1-2 months. States he will try to sleep, lying flat, and after a few minutes, will sit upright, and c/o being unable to breath. States at most can sleep 20 minutes or so before symptoms recur.  Symptoms gradual onset, moderate-sev, persistent, slowly worsening. Has hx esrd/hd for past 7 yrs, states compliant w dialysis, including yesterday. Makes no urine at baseline. +non prod cough. No fever. Non smoker. No current chest pain - states occurs randomly, at rest, lasts a few minutes. Denies increased swelling.   The history is provided by the patient and the spouse.  Shortness of Breath  Associated symptoms: cough   Associated symptoms: no abdominal pain, no chest pain, no fever, no headaches, no neck pain, no rash, no sore throat and no vomiting     Past Medical History:  Diagnosis Date  . Anemia   . Ascending aortic aneurysm ([place])   . CAD (coronary artery disease), native coronary artery 01/22/2019  . Chronic combined systolic and diastolic CHF (congestive heart failure) ([place])   . Diabetic foot ulcer ([place]) 09/03/2017  . Diabetic wet gangrene of the foot ([place]) 09/04/2017  . ESRD (end stage renal disease) on dialysis [facility])    [place] [address], [place], [place] (10/15/2018)  . Headache    "monthly" (06/15/2018)  . HIV disease ([place])   . Hyperlipidemia LDL goal <70 01/22/2019  . Hyperparathyroidism, secondary ([place])   . Hypertension   . Hypertensive heart disease with end stage renal disease on dialysis ([place]) 03/13/2017  . Mallory-Weiss tear 2018  . PAF (paroxysmal atrial fibrillation) ([place])   . Paroxysmal atrial flutter ([place])   .  Pneumonia X 1  . QT prolongation   . Subacute osteomyelitis, right ankle and foot ([place])   . SVT (supraventricular tachycardia) ([place])    ? afib or atrial flutter s/p TEE/DCCV with subsequent ablation due to reoccurrence in [place]  . Type 2 diabetes mellitus [facility])     Patient Active Problem List   Diagnosis Date Noted  . QT prolongation 02/08/2019  . Lesion of finger 02/08/2019  . Pain and numbness of upper extremity 02/08/2019  . Insomnia 02/08/2019  . Hyperlipidemia LDL goal <70 01/22/2019  . CAD (coronary artery disease), native coronary artery 01/22/2019  . Unstable angina ([place]) 01/22/2019  . Ascending aortic aneurysm ([place]) 12/07/2018  . Noninfectious gastroenteritis 10/27/2018  . 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 Chest pain 10/15/2018  . Non-ST elevation (NSTEMI) myocardial infarction ([place])   . Chest pain with moderate risk for cardiac etiology 06/15/2018  . Atrial flutter ([place]) 06/15/2018  . Somniloquy 06/12/2018  . Encounter for therapeutic drug monitoring 05/27/2018  . Acute on chronic systolic heart failure ([place])   . Murmur, cardiac 02/13/2018  . Mallory-Weiss tear 01/16/2018  . Morbid obesity ([place])   . Fall   . Subtherapeutic international normalized ratio (INR)   . History of left below knee amputation ([place]) 12/11/2017  . History of supraventricular tachycardia   . S/P bilateral BKA (below knee amputation) ([place])   . Anemia of chronic disease   . Leukocytosis   . Non-ischemic cardiomyopathy ([place])   .  Hypokalemia 09/04/2017  . Essential hypertension 09/03/2017  . GERD (gastroesophageal reflux disease) 09/03/2017  . GIB (gastrointestinal bleeding) 07/26/2017  . Anemia due to end stage renal disease ([place]) 07/26/2017  . PAF (paroxysmal atrial fibrillation) ([place]) 07/26/2017  . Symptomatic anemia 07/26/2017  . End stage renal disease ([place]) 05/05/2017  . HIV disease ([place]) 03/25/2017  . Hypertensive heart disease with end stage renal disease on dialysis ([place])  03/13/2017  . ESRD on dialysis ([place]) 03/13/2017  . Type 2 diabetes mellitus with complication ([place]) 62/37/6283    Past Surgical History:  Procedure Laterality Date  . AMPUTATION Left    foot  . AMPUTATION Right 12/06/2017   Procedure: AMPUTATION BELOW KNEE;  Surgeon: [name], MD;  Location: [facility];  Service: Orthopedics;  Laterality: Right;  . APPLICATION OF WOUND VAC Right 09/04/2017   Procedure: APPLICATION OF WOUND VAC;  Surgeon: [name], DPM;  Location: [facility];  Service: Podiatry;  Laterality: Right;  . APPLICATION OF WOUND VAC  12/06/2017   Procedure: APPLICATION OF WOUND VAC;  Surgeon: [name], MD;  Location: [facility];  Service: Orthopedics;;  . AV FISTULA PLACEMENT Right 10/19/2012  . AV FISTULA REPAIR Right ~ 02/2018   "had it cleaned out"  . BELOW KNEE LEG AMPUTATION Left ~ 2016  . COLONOSCOPY WITH PROPOFOL N/A 07/30/2017   Procedure: COLONOSCOPY WITH PROPOFOL;  Surgeon: [name], MD;  Location: [facility];  Service: Endoscopy;  Laterality: N/A;  . ESOPHAGOGASTRODUODENOSCOPY (EGD) WITH PROPOFOL N/A 07/28/2017   Procedure: ESOPHAGOGASTRODUODENOSCOPY (EGD) WITH PROPOFOL;  Surgeon: [name], MD;  Location: [facility];  Service: Endoscopy;  Laterality: N/A;  . ESOPHAGOGASTRODUODENOSCOPY (EGD) WITH PROPOFOL N/A 01/15/2018   Procedure: ESOPHAGOGASTRODUODENOSCOPY (EGD) WITH PROPOFOL;  Surgeon: [name], MD;  Location: [facility] ENDOSCOPY;  Service: Endoscopy;  Laterality: N/A;  . FLEXIBLE SIGMOIDOSCOPY N/A 07/27/2017   Procedure: FLEXIBLE SIGMOIDOSCOPY;  Surgeon: [name], MD;  Location[facility];  Service: Endoscopy;  Laterality: N/A;  . GRAFT APPLICATION Right 15/17/6160   Procedure: SKIN GRAFT APPLICATION RIGHT FOOT;  Surgeon: [name], DPM;  Location: [facility];  Service: Podiatry;  Laterality: Right;  . I&D EXTREMITY Right 09/04/2017   Procedure: IRRIGATION AND DEBRIDEMENT EXTREMITY;  Surgeon: [name], DPM;  Location: [facility];   Service: Podiatry;  Laterality: Right;  . I&D EXTREMITY Right 11/12/2017   Procedure: IRRIGATION AND DEBRIDEMENT ULCER RIGHT FOOT;  Surgeon: [name], DPM;  Location: [facility];  Service: Podiatry;  Laterality: Right;  . LEFT HEART CATH AND CORONARY ANGIOGRAPHY N/A 06/18/2018   Procedure: LEFT HEART CATH AND CORONARY ANGIOGRAPHY;  Surgeon: [name], Peter M, MD;  Location: [facility];  Service: Cardiovascular;  Laterality: N/A;  . LEFT HEART CATH AND CORONARY ANGIOGRAPHY N/A 01/25/2019   Procedure: LEFT HEART CATH AND CORONARY ANGIOGRAPHY;  Surgeon: [name], Peter M, MD;  Location: [facility];  Service: Cardiovascular;  Laterality: N/A;  . WOUND DEBRIDEMENT N/A 09/24/2017   Procedure: DEBRIDEMENT WOUND;  Surgeon: [name], DPM;  Location: [facility];  Service: Podiatry;  Laterality: N/A;        Home Medications    Prior to Admission medications   Medication Sig Start Date End Date Taking? Authorizing Provider  acetaminophen (TYLENOL) 325 MG tablet Take 650 mg by mouth every 6 (six) hours as needed (for headaches).     [provider]  amiodarone (PACERONE) 200 MG tablet Take 1 tablet (200 mg total) by mouth daily. Patient  taking differently: Take 200 mg by mouth at bedtime.  08/05/18   Consuelo Pandy, PA-C  aspirin 81 MG chewable tablet Chew 1 tablet (81 mg total) by mouth daily. 01/26/19   Helberg, Larkin Ina, MD  BIKTARVY 50-200-25 MG TABS tablet TAKE 1 TABLET BY MOUTH DAILY. 02/08/19   Carlyle Basques, MD  camphor-menthol [facility]) lotion Apply topically as needed for itching. Patient taking differently: Apply 1 application topically daily as needed for itching.  12/11/17   Colbert Ewing, MD  cinacalcet (SENSIPAR) 90 MG tablet Take 90 mg by mouth daily. 04/27/18   [provider]  ferric citrate (AURYXIA) 1 GM 210 MG(Fe) tablet Take 420-840 mg by mouth See admin instructions. Take 840 mg by mouth three times a day after meals and 420 mg after each snack     [provider]  ferric gluconate 125 mg in sodium chloride 0.9 % 100 mL Inject 125 mg into the vein Every Tuesday,Thursday,and Saturday with dialysis. Patient not taking: Reported on 01/22/2019 05/10/17   Hosie Poisson, MD  gabapentin (NEURONTIN) 100 MG capsule Take 1 capsule (100 mg total) by mouth every dialysis. 01/09/18   Jamse Arn, MD  insulin glargine (LANTUS) 100 unit/mL SOPN Inject 0.3 mLs (30 Units total) into the skin at bedtime. Patient taking differently: Inject 20 Units into the skin at bedtime.  12/19/17   Angiulli, Lavon Paganini, PA-C  insulin lispro (HUMALOG) 100 UNIT/ML KiwkPen Please inject 10 units, twice daily, ~10 min before your two largest meals of the day. Patient taking differently: Inject 10 Units into the skin See admin instructions. Inject 10 units into the skin two times a day- 10 minutes before the 2 largest meals of the day 10/29/18   Kathi Ludwig, MD  linagliptin (TRADJENTA) 5 MG TABS tablet Take 1 tablet (5 mg total) by mouth daily. 12/07/18   [name], MD  metoprolol succinate (TOPROL-XL) 100 MG 24 hr tablet TAKE 1 TABLET BY MOUTH ONCE DAILY TAKE WITH OR IMMEDIATELY FOLLOWING A MEAL Patient taking differently: Take 100 mg by mouth daily. TAKE WITH OR IMMEDIATELY FOLLOWING A MEAL 08/05/18   Lyda Jester M, PA-C  nitroGLYCERIN (NITROSTAT) 0.4 MG SL tablet Place 1 tablet (0.4 mg total) under the tongue every 5 (five) minutes as needed for chest pain. 01/29/19   Dorrell, Andree Elk, MD  omega-3 acid ethyl esters (LOVAZA) 1 g capsule Take 2 capsules (2 g total) by mouth 2 (two) times daily. 02/03/19   Sueanne Margarita, MD  pantoprazole (PROTONIX) 40 MG tablet Take 2 tablets (80 mg total) by mouth daily. Patient taking differently: Take 40 mg by mouth 2 (two) times daily.  02/16/18   Alphonzo Grieve, MD  polycarbophil (FIBERCON) 625 MG tablet Take 1 tablet (625 mg total) by mouth daily. 12/19/17   Angiulli, Lavon Paganini, PA-C  ranolazine (RANEXA) 500 MG 12  hr tablet Take 1 tablet (500 mg total) by mouth 2 (two) times daily. 01/29/19   Dorrell, Andree Elk, MD  rosuvastatin (CRESTOR) 20 MG tablet Take 0.5 tablets (10 mg total) by mouth daily. 01/29/19   Dorrell, Andree Elk, MD  Skin Protectants, Misc. (EUCERIN) cream Apply topically as needed for dry skin. 02/08/19   Welford Roche, MD  warfarin (COUMADIN) 7.5 MG tablet TAKE 1/2 TABLET BY MOUTH DAILY, EXCEPT 1 TABLET ON Monday, Wednesday, AND Saturday OR AS DIRECTED BY ANTICOAGULATION CLINIC. Patient taking differently: Take 3.75-7.5 mg by mouth at bedtime. Take 3.75 mg by mouth at bedtime on Sun/Tues/Thurs/Fri and 7.5  mg on Mon/Wed/Sat 12/25/18   Sueanne Margarita, MD    Family History Family History  Problem Relation Age of Onset  . Heart disease Mother   . Diabetes Mother   . Heart disease Father   . Cancer Neg Hx     Social History Social History   Tobacco Use  . Smoking status: Never Smoker  . Smokeless tobacco: Never Used  Substance Use Topics  . Alcohol use: Never    Frequency: Never  . Drug use: Never     Allergies   Oxycodone-acetaminophen   Review of Systems Review of Systems  Constitutional: Negative for fever.  HENT: Negative for sore throat.   Eyes: Negative for redness.  Respiratory: Positive for cough and shortness of breath.   Cardiovascular: Negative for chest pain, palpitations and leg swelling.  Gastrointestinal: Negative for abdominal pain, diarrhea and vomiting.  Endocrine: Negative for polyuria.  Genitourinary: Negative for flank pain.  Musculoskeletal: Negative for back pain and neck pain.  Skin: Negative for rash.  Neurological: Negative for headaches.  Hematological: Does not bruise/bleed easily.  Psychiatric/Behavioral: Negative for confusion.     Physical Exam Updated Vital Signs BP 137/90   Pulse 79   Temp 98.4 F (36.9 C)   Resp 18   SpO2 (!) 83%   Physical Exam Vitals signs and nursing note reviewed.  Constitutional:       Appearance: Normal appearance. He is well-developed.  HENT:     Head: Atraumatic.     Nose: Nose normal.     Mouth/Throat:     Mouth: Mucous membranes are moist.     Pharynx: Oropharynx is clear.  Eyes:     General: No scleral icterus.    Conjunctiva/sclera: Conjunctivae normal.     Pupils: Pupils are equal, round, and reactive to light.  Neck:     Musculoskeletal: Normal range of motion and neck supple. No neck rigidity.     Trachea: No tracheal deviation.  Cardiovascular:     Rate and Rhythm: Normal rate and regular rhythm.     Pulses: Normal pulses.     Heart sounds: Normal heart sounds. No murmur. No friction rub. No gallop.   Pulmonary:     Effort: Pulmonary effort is normal. No accessory muscle usage or respiratory distress.     Breath sounds: Normal breath sounds.  Abdominal:     General: Bowel sounds are normal. There is no distension.     Palpations: Abdomen is soft.     Tenderness: There is no abdominal tenderness. There is no guarding.  Genitourinary:    Comments: No cva tenderness. Musculoskeletal:     Comments: bil leg amputee. No upper leg swelling.   Skin:    General: Skin is warm and dry.     Findings: No rash.  Neurological:     Mental Status: He is alert.     Comments: Alert, speech clear.   Psychiatric:        Mood and Affect: Mood normal.      ED Treatments / Results  Labs (all labs ordered are listed, but only abnormal results are displayed) Results for orders placed or performed during the hospital encounter of 02/21/19  CBC  Result Value Ref Range   WBC 7.6 4.0 - 10.5 K/uL   RBC 3.10 (L) 4.22 - 5.81 MIL/uL   Hemoglobin 9.5 (L) 13.0 - 17.0 g/dL   HCT 31.4 (L) 39.0 - 52.0 %   MCV 101.3 (H) 80.0 - 100.0 fL  MCH 30.6 26.0 - 34.0 pg   MCHC 30.3 30.0 - 36.0 g/dL   RDW 17.2 (H) 11.5 - 15.5 %   Platelets 216 150 - 400 K/uL   nRBC 0.5 (H) 0.0 - 0.2 %  Comprehensive metabolic panel  Result Value Ref Range   Sodium 136 135 - 145 mmol/L    Potassium 3.6 3.5 - 5.1 mmol/L   Chloride 96 (L) 98 - 111 mmol/L   CO2 26 22 - 32 mmol/L   Glucose, Bld 220 (H) 70 - 99 mg/dL   BUN 23 (H) 6 - 20 mg/dL   Creatinine, Ser 6.74 (H) 0.61 - 1.24 mg/dL   Calcium 10.7 (H) 8.9 - 10.3 mg/dL   Total Protein 7.4 6.5 - 8.1 g/dL   Albumin 3.7 3.5 - 5.0 g/dL   AST 66 (H) 15 - 41 U/L   ALT 56 (H) 0 - 44 U/L   Alkaline Phosphatase 96 38 - 126 U/L   Total Bilirubin 2.6 (H) 0.3 - 1.2 mg/dL   GFR calc non Af Amer 9 (L) >60 mL/min   GFR calc Af Amer 10 (L) >60 mL/min   Anion gap 14 5 - 15  Protime-INR  Result Value Ref Range   Prothrombin Time 36.0 (H) 11.4 - 15.2 seconds   INR 3.69   I-stat troponin, ED  Result Value Ref Range   Troponin i, poc 0.19 (HH) 0.00 - 0.08 ng/mL   Comment NOTIFIED PHYSICIAN    Comment 3           Dg Chest 2 View  Result Date: 01/28/2019 CLINICAL DATA:  53 y/o  M; chest pain and shortness of breath. EXAM: CHEST - 2 VIEW COMPARISON:  01/22/2019 chest radiograph FINDINGS: Stable enlarged cardiac silhouette given projection and technique. Increased diffuse hazy and reticular opacities of the lungs. No pleural effusion or pneumothorax. Bones are unremarkable. IMPRESSION: Increased hazy and reticular opacities of the lungs probably representing pulmonary edema. Underlying pneumonia is not excluded. Enlarged cardiac silhouette. Electronically Signed   By: Kristine Garbe M.D.   On: 01/28/2019 03:28   Dg Chest 2 View  Result Date: 01/22/2019 CLINICAL DATA:  Chest pain and shortness of breath EXAM: CHEST - 2 VIEW COMPARISON:  October 15, 2018 FINDINGS: Cardiomegaly. The hila and mediastinum are unremarkable. No pneumothorax. Haziness over the bases is symmetric and probably due to overlapping soft tissues. Subtle infiltrates not excluded. No other acute abnormalities. IMPRESSION: Haziness over the lung bases is symmetric and favored to be due to overlapping soft tissues. Subtle infiltrates considered less likely.  Electronically Signed   By: Dorise Bullion III M.D   On: 01/22/2019 16:05   Ct Head Wo Contrast  Result Date: 01/28/2019 CLINICAL DATA:  New headache.  Anticoagulant use. EXAM: CT HEAD WITHOUT CONTRAST TECHNIQUE: Contiguous axial images were obtained from the base of the skull through the vertex without intravenous contrast. COMPARISON:  None. FINDINGS: Brain: No evidence of acute infarction, hemorrhage, hydrocephalus, extra-axial collection or mass lesion/mass effect. Remote small vessel infarcts in the bilateral basal ganglia, well-defined and chronic appearing on reformats. There are a few superficial parenchymal calcifications along the right parietal and left frontal convexity. These could be arterial/embolic. There is a segment of prominent atherosclerotic calcification of the right pericallosal artery. Vascular: Multifocal atherosclerotic calcification. Skull: No acute finding Sinuses/Orbits: Bilateral cataract resection IMPRESSION: 1. No acute finding. 2. Prominent atherosclerotic calcification for age. Remote basal ganglia infarcts. Electronically Signed   By: Neva Seat.D.  On: 01/28/2019 05:33   Dg Chest Port 1 View  Result Date: 02/21/2019 CLINICAL DATA:  Shortness of breath.  Chest pressure.  On dialysis. EXAM: PORTABLE CHEST 1 VIEW COMPARISON:  01/28/2019 FINDINGS: Mildly degraded exam due to AP portable technique and patient body habitus. Midline trachea. Moderate cardiomegaly. Superior mediastinal soft tissue fullness is likely technique related. Cannot exclude small bilateral pleural effusions. No pneumothorax. Similar pulmonary interstitial prominence with thickening of the right minor fissure. IMPRESSION: Similar pulmonary interstitial prominence. Likely a combination of low lung volumes/patient body habitus and mild pulmonary venous congestion. No overt congestive failure. Electronically Signed   By: Abigail Miyamoto M.D.   On: 02/21/2019 12:30   Dg Hand Complete Right  Result  Date: 02/08/2019 CLINICAL DATA:  Right hand pain EXAM: RIGHT HAND - COMPLETE 3+ VIEW COMPARISON:  None. FINDINGS: No acute bony abnormality. Specifically, no fracture, subluxation, or dislocation. Extensive vascular calcifications. IMPRESSION: No acute bony abnormality. Electronically Signed   By: Rolm Baptise M.D.   On: 02/08/2019 21:32    EKG EKG Interpretation  Date/Time:  Sunday February 21 2019 11:24:08 EST Ventricular Rate:  92 PR Interval:    QRS Duration: 115 QT Interval:  482 QTC Calculation: 597 R Axis:   69 Text Interpretation:  Atrial fibrillation Non-specific intra-ventricular conduction delay Non-specific ST-t changes Confirmed by Lajean Saver [phone]) on 02/21/2019 11:43:43 AM   Radiology Dg Chest Port 1 View  Result Date: 02/21/2019 CLINICAL DATA:  Shortness of breath.  Chest pressure.  On dialysis. EXAM: PORTABLE CHEST 1 VIEW COMPARISON:  01/28/2019 FINDINGS: Mildly degraded exam due to AP portable technique and patient body habitus. Midline trachea. Moderate cardiomegaly. Superior mediastinal soft tissue fullness is likely technique related. Cannot exclude small bilateral pleural effusions. No pneumothorax. Similar pulmonary interstitial prominence with thickening of the right minor fissure. IMPRESSION: Similar pulmonary interstitial prominence. Likely a combination of low lung volumes/patient body habitus and mild pulmonary venous congestion. No overt congestive failure. Electronically Signed   By: Abigail Miyamoto M.D.   On: 02/21/2019 12:30    Procedures Procedures (including critical care time)  Medications Ordered in ED Medications - No data to display   Initial Impression / Assessment and Plan / ED Course  I have reviewed the triage vital signs and the nursing notes.  Pertinent labs & imaging results that were available during my care of the patient were reviewed by me and considered in my medical decision making (see chart for details).  Iv ns. Continuous pulse  ox and monitor. Ecg. Cxr. Labs.   On room air sats in 80's. 4 liters o2, sat 95%.   Reviewed nursing notes and prior charts for additional history.   Labs reviewed - trop is elev.   cxr reviewed - vasc congestion, no pna.   Given recent chest pain, increased dyspnea, hypoxia, elev trop  - will admit. Medicine team consulted for admission.       Final Clinical Impressions(s) / ED Diagnoses   Final diagnoses:  None    ED Discharge Orders    None       Lajean Saver, MD 02/21/19 1240

## 2019-02-21 NOTE — H&P (Addendum)
Date: 02/21/2019               Patient Name:  Thomas [name] MRN: [id number]  DOB: 10-20-1966 Age / Sex: 53 y.o., male   PCP: [name], MD         Medical Service: Internal Medicine Teaching Service         Attending Physician: Dr. Aldine Contes, MD    First Contact: Dr. Myrtie Hawk Pager: [phone]  Second Contact: Dr. Shan Levans Pager: [phone]       After Hours (After 5p/  First Contact Pager: [phone]  weekends / holidays): Second Contact Pager: [phone]   Chief Complaint: Insomnia   History of Present Illness: 53 year old male past medical history of ESRD on hemodialysis (TTS), DM 2, HTN, paroxysmal A. Fib on Warfarin, CAD, HFrEF, presented to ED due to difficulty sleeping that started a month ago. Per his wife, he wakes up with difficulty breathing after 20 minutes of sleep at night and is anxious. He has been tired and has low energy during the day. He was supposed to do a sleep study out patient but he did not get an early appointment. He has had chronic chest pain and shortness of breath which has been the same over past months. He also reports some non productive cough with no fever or chills.  No nausea vomiting no abdominal pain. He has been compliant with his HD and the last session was yesterday.   Meds:  Current Meds  Medication Sig  . acetaminophen (TYLENOL) 325 MG tablet Take 650 mg by mouth every 6 (six) hours as needed (for headaches).   Marland Kitchen amiodarone (PACERONE) 200 MG tablet Take 1 tablet (200 mg total) by mouth daily. (Patient taking differently: Take 200 mg by mouth at bedtime. )  . aspirin 81 MG chewable tablet Chew 1 tablet (81 mg total) by mouth daily.  Marland Kitchen BIKTARVY 50-200-25 MG TABS tablet TAKE 1 TABLET BY MOUTH DAILY. (Patient taking differently: Take 1 tablet by mouth daily. )  . camphor-menthol (SARNA) lotion Apply topically as needed for itching. (Patient taking differently: Apply 1 application topically daily as needed for itching. )  . cinacalcet  (SENSIPAR) 90 MG tablet Take 90 mg by mouth daily.  . ferric citrate (AURYXIA) 1 GM 210 MG(Fe) tablet Take 420-840 mg by mouth See admin instructions. Take 840 mg by mouth three times a day after meals and 420 mg after each snack  . gabapentin (NEURONTIN) 100 MG capsule Take 1 capsule (100 mg total) by mouth every dialysis.  Marland Kitchen insulin glargine (LANTUS) 100 unit/mL SOPN Inject 0.3 mLs (30 Units total) into the skin at bedtime. (Patient taking differently: Inject 20 Units into the skin at bedtime. )  . insulin lispro (HUMALOG) 100 UNIT/ML KiwkPen Please inject 10 units, twice daily, ~10 min before your two largest meals of the day. (Patient taking differently: Inject 10 Units into the skin See admin instructions. Inject 10 units into the skin two times a day- 10 minutes before the 2 largest meals of the day)  . linagliptin (TRADJENTA) 5 MG TABS tablet Take 1 tablet (5 mg total) by mouth daily.  . metoprolol succinate (TOPROL-XL) 100 MG 24 hr tablet TAKE 1 TABLET BY MOUTH ONCE DAILY TAKE WITH OR IMMEDIATELY FOLLOWING A MEAL (Patient taking differently: Take 100 mg by mouth daily. TAKE WITH OR IMMEDIATELY FOLLOWING A MEAL)  . nitroGLYCERIN (NITROSTAT) 0.4 MG SL tablet Place 1 tablet (0.4 mg total) under the tongue every 5 (five) minutes  as needed for chest pain.  Marland Kitchen omega-3 acid ethyl esters (LOVAZA) 1 g capsule Take 2 capsules (2 g total) by mouth 2 (two) times daily.  . pantoprazole (PROTONIX) 40 MG tablet Take 2 tablets (80 mg total) by mouth daily. (Patient taking differently: Take 40 mg by mouth 2 (two) times daily. )  . ranolazine (RANEXA) 500 MG 12 hr tablet Take 1 tablet (500 mg total) by mouth 2 (two) times daily.  . rosuvastatin (CRESTOR) 20 MG tablet Take 0.5 tablets (10 mg total) by mouth daily.  . Skin Protectants, Misc. (EUCERIN) cream Apply topically as needed for dry skin.  Marland Kitchen warfarin (COUMADIN) 7.5 MG tablet TAKE 1/2 TABLET BY MOUTH DAILY, EXCEPT 1 TABLET ON Monday, Wednesday, AND Saturday  OR AS DIRECTED BY ANTICOAGULATION CLINIC. (Patient taking differently: Take 3.75-7.5 mg by mouth at bedtime. Take 3.75 mg by mouth at bedtime on Sun/Tues/Thurs/Fri and 7.5 mg on Mon/Wed/Sat)     Allergies: Allergies as of 02/21/2019 - Review Complete 02/21/2019  Allergen Reaction Noted  . Oxycodone-acetaminophen Other (See Comments) 01/13/2018   Past Medical History:  Diagnosis Date  . Anemia   . Ascending aortic aneurysm ([place])   . CAD (coronary artery disease), native coronary artery 01/22/2019  . Chronic combined systolic and diastolic CHF (congestive heart failure) ([place])   . Diabetic foot ulcer ([place]) 09/03/2017  . Diabetic wet gangrene of the foot ([place]) 09/04/2017  . ESRD (end stage renal disease) on dialysis [facility])    [place] [address], [place], [place] (10/15/2018)  . Headache    "monthly" (06/15/2018)  . HIV disease ([place])   . Hyperlipidemia LDL goal <70 01/22/2019  . Hyperparathyroidism, secondary ([place])   . Hypertension   . Hypertensive heart disease with end stage renal disease on dialysis ([place]) 03/13/2017  . Mallory-Weiss tear 2018  . PAF (paroxysmal atrial fibrillation) ([place])   . Paroxysmal atrial flutter ([place])   . Pneumonia X 1  . QT prolongation   . Subacute osteomyelitis, right ankle and foot ([place])   . SVT (supraventricular tachycardia) ([place])    ? afib or atrial flutter s/p TEE/DCCV with subsequent ablation due to reoccurrence in [place]  . Type 2 diabetes mellitus ([facility])     Family History:  Family History  Problem Relation Age of Onset  . Heart disease Mother   . Diabetes Mother   . Heart disease Father   . Cancer Neg Hx     Social History: Never smoked  Review of Systems: A complete ROS was negative except as per HPI.   Physical Exam: Blood pressure (!) 150/86, pulse 89, temperature 98.8 F (37.1 C), temperature source Oral, resp. rate (!) 24, height 6\' 5"  (1.956 m), weight 125.2 kg, SpO2 98 %. Physical Exam  Constitutional: Appears well-developed and  well-nourished. Falling sleep during physical exam but easily arousable to verbal stimuli. No no distress.  HENT:  Head: Normocephalic and atraumatic.  Eyes: Conjunctivae are normal.  Cardiovascular: Normal rate, regular rhythm and normal heart sounds. JVP is elevated Respiratory: Effort normal and breath sounds normal. No respiratory distress on 4 li nasal O2 . No wheezes. Has bilateral fine crackle Adbomen: Is distended, Soft. Bowel sounds are normal. There is no tenderness.  Musculoskeletal: Has bilateral BKA Neurological: Is alert and oriented x3 but falls sleep during physical exam.  Skin: Not diaphoretic. No erythema.  Psychiatric: Normal mood and affect. Behavior is normal. Judgment and thought content normal.    Mild transaminitis Elevated Total Bili at 2.6  EKG: personally reviewed my interpretation is AFib  CXR: personally reviewed my interpretation is wide mediastinum (likely technical related per radiologist) small  pleural effusion and some pulmonary congestion.   Assessment & Plan by Problem: Active Problems:   Insomnia 53 year old male past medical history of ESRD on hemodialysis (TTS), IV, DM 2, HTN, paroxysmal A. Fib on Warfarin, nonischemic cardiomyopathy, presented to ED due to problem sleeping at night and fatigue during the day.  Insomina, Daily fatigue Orthopnea and PND  Likely due to Probable OSA + volume overload:  Patient and his wife think that he has sleep apnea having daily fatigue and not being able to sleep. Sleep study has been pending. Has some abdominal distension, JVD and mild crackle on exam than can also be contributing to difficulty sleeping.  Cant give diuretic since he is ESRD and anuric and has soft BP at the same time.  Plan will be nephro consult for HD tomorrow, may need adjusted dry weight Will try CPAP tonight. -F/u sleep study out patient -Cardiac monitory -Continuous pulse oxymetry -Will try CPAP tonight  CAD CHF Cardiac Cath  01/25/2019 EF 35-40%, Prox LAD lesion 30% stenosed, Mid LAD lesion 80% stenosed, Dist LAD lesion 99% stenosed, Ost 1st Diag lesion 100% stenosed, Ost 3rd Diag lesion is 95%, ... stenosed. On Metoprolol succinate 100 mg QD, Crestor 20 mg QD, (Ranolzain discontinued recently)  He has on and off chest pain but does not report worsening. -ASA 81 mg QD -Holding BP due to low BP  A-fib: -Continue home Amiodarone 200 mg QD -Continue home Warfarin (per pharmacy) -INR   DM II with complication: - Sensitive SSI -Novelog 6 u TID with meal -Lantus 20 u SQ at bedtime -CBG monitoring  ESRD on HD (TTS), last hemodialysis was yesterday. He is aneuric. He has some volume overload on exam that may also play a role in his SOB. No uremia and no indication for urgent HD today. Will f/u with nephro for inpatient HD tomorrow. May need to adjust dry weight.  -Consult Nephrology for inpatient HD tomorrow.    HIV: Last CD4 (01/2017), nl at 610 -Continue home med tomorrow -F/u with ID outpatient -CD4  Diet: Renal/carb modified IV fluid: None VTE ppx: Warfarin Code status: Full  Dispo: Admit patient to  Observation with expected length of stay less than 2 midnights.  SignedDewayne Hatch, MD 02/21/2019, 5:36 PM  Pager: [phone]

## 2019-02-21 NOTE — ED Notes (Signed)
Dr.Steinl made awarre of pt I-stat troponin level. ED-Lab.

## 2019-02-22 ENCOUNTER — Encounter: Payer: Self-pay | Admitting: Cardiovascular Disease

## 2019-02-22 DIAGNOSIS — K219 Gastro-esophageal reflux disease without esophagitis: Secondary | ICD-10-CM | POA: Diagnosis present

## 2019-02-22 DIAGNOSIS — G4733 Obstructive sleep apnea (adult) (pediatric): Secondary | ICD-10-CM | POA: Diagnosis present

## 2019-02-22 DIAGNOSIS — Z7901 Long term (current) use of anticoagulants: Secondary | ICD-10-CM | POA: Diagnosis not present

## 2019-02-22 DIAGNOSIS — I428 Other cardiomyopathies: Secondary | ICD-10-CM

## 2019-02-22 DIAGNOSIS — N186 End stage renal disease: Secondary | ICD-10-CM

## 2019-02-22 DIAGNOSIS — R072 Precordial pain: Secondary | ICD-10-CM | POA: Diagnosis present

## 2019-02-22 DIAGNOSIS — Z21 Asymptomatic human immunodeficiency virus [HIV] infection status: Secondary | ICD-10-CM | POA: Diagnosis present

## 2019-02-22 DIAGNOSIS — I132 Hypertensive heart and chronic kidney disease with heart failure and with stage 5 chronic kidney disease, or end stage renal disease: Secondary | ICD-10-CM | POA: Diagnosis present

## 2019-02-22 DIAGNOSIS — Z8249 Family history of ischemic heart disease and other diseases of the circulatory system: Secondary | ICD-10-CM | POA: Diagnosis not present

## 2019-02-22 DIAGNOSIS — G4700 Insomnia, unspecified: Secondary | ICD-10-CM

## 2019-02-22 DIAGNOSIS — Z89511 Acquired absence of right leg below knee: Secondary | ICD-10-CM

## 2019-02-22 DIAGNOSIS — Z7982 Long term (current) use of aspirin: Secondary | ICD-10-CM

## 2019-02-22 DIAGNOSIS — D631 Anemia in chronic kidney disease: Secondary | ICD-10-CM | POA: Diagnosis present

## 2019-02-22 DIAGNOSIS — Z89512 Acquired absence of left leg below knee: Secondary | ICD-10-CM | POA: Diagnosis not present

## 2019-02-22 DIAGNOSIS — I2510 Atherosclerotic heart disease of native coronary artery without angina pectoris: Secondary | ICD-10-CM | POA: Diagnosis present

## 2019-02-22 DIAGNOSIS — Z992 Dependence on renal dialysis: Secondary | ICD-10-CM

## 2019-02-22 DIAGNOSIS — R0602 Shortness of breath: Secondary | ICD-10-CM | POA: Diagnosis not present

## 2019-02-22 DIAGNOSIS — I5022 Chronic systolic (congestive) heart failure: Secondary | ICD-10-CM

## 2019-02-22 DIAGNOSIS — Z5181 Encounter for therapeutic drug level monitoring: Secondary | ICD-10-CM

## 2019-02-22 DIAGNOSIS — E1122 Type 2 diabetes mellitus with diabetic chronic kidney disease: Secondary | ICD-10-CM | POA: Diagnosis present

## 2019-02-22 DIAGNOSIS — I712 Thoracic aortic aneurysm, without rupture: Secondary | ICD-10-CM | POA: Diagnosis present

## 2019-02-22 DIAGNOSIS — Z794 Long term (current) use of insulin: Secondary | ICD-10-CM | POA: Diagnosis not present

## 2019-02-22 DIAGNOSIS — E785 Hyperlipidemia, unspecified: Secondary | ICD-10-CM | POA: Diagnosis present

## 2019-02-22 DIAGNOSIS — Z833 Family history of diabetes mellitus: Secondary | ICD-10-CM | POA: Diagnosis not present

## 2019-02-22 DIAGNOSIS — I480 Paroxysmal atrial fibrillation: Secondary | ICD-10-CM | POA: Diagnosis present

## 2019-02-22 DIAGNOSIS — I5042 Chronic combined systolic (congestive) and diastolic (congestive) heart failure: Secondary | ICD-10-CM | POA: Diagnosis present

## 2019-02-22 DIAGNOSIS — Z885 Allergy status to narcotic agent status: Secondary | ICD-10-CM | POA: Diagnosis not present

## 2019-02-22 DIAGNOSIS — I959 Hypotension, unspecified: Secondary | ICD-10-CM | POA: Diagnosis not present

## 2019-02-22 DIAGNOSIS — R740 Nonspecific elevation of levels of transaminase and lactic acid dehydrogenase [LDH]: Secondary | ICD-10-CM | POA: Diagnosis not present

## 2019-02-22 DIAGNOSIS — N2581 Secondary hyperparathyroidism of renal origin: Secondary | ICD-10-CM | POA: Diagnosis present

## 2019-02-22 DIAGNOSIS — Z79899 Other long term (current) drug therapy: Secondary | ICD-10-CM

## 2019-02-22 DIAGNOSIS — I252 Old myocardial infarction: Secondary | ICD-10-CM | POA: Diagnosis not present

## 2019-02-22 LAB — CBC
HCT: 29.2 % — ABNORMAL LOW (ref 39.0–52.0)
Hemoglobin: 9.3 g/dL — ABNORMAL LOW (ref 13.0–17.0)
MCH: 31.6 pg (ref 26.0–34.0)
MCHC: 31.8 g/dL (ref 30.0–36.0)
MCV: 99.3 fL (ref 80.0–100.0)
Platelets: 220 10*3/uL (ref 150–400)
RBC: 2.94 MIL/uL — ABNORMAL LOW (ref 4.22–5.81)
RDW: 17.3 % — ABNORMAL HIGH (ref 11.5–15.5)
WBC: 8.8 10*3/uL (ref 4.0–10.5)
nRBC: 0.7 % — ABNORMAL HIGH (ref 0.0–0.2)

## 2019-02-22 LAB — COMPREHENSIVE METABOLIC PANEL
ALT: 194 U/L — ABNORMAL HIGH (ref 0–44)
AST: 215 U/L — AB (ref 15–41)
Albumin: 3.5 g/dL (ref 3.5–5.0)
Alkaline Phosphatase: 123 U/L (ref 38–126)
Anion gap: 14 (ref 5–15)
BUN: 35 mg/dL — ABNORMAL HIGH (ref 6–20)
CHLORIDE: 97 mmol/L — AB (ref 98–111)
CO2: 24 mmol/L (ref 22–32)
Calcium: 10.5 mg/dL — ABNORMAL HIGH (ref 8.9–10.3)
Creatinine, Ser: 8.56 mg/dL — ABNORMAL HIGH (ref 0.61–1.24)
GFR calc Af Amer: 7 mL/min — ABNORMAL LOW (ref >60)
GFR calc non Af Amer: 6 mL/min — ABNORMAL LOW (ref >60)
Glucose, Bld: 188 mg/dL — ABNORMAL HIGH (ref 70–99)
Potassium: 3.8 mmol/L (ref 3.5–5.1)
Sodium: 135 mmol/L (ref 135–145)
Total Bilirubin: 3.2 mg/dL — ABNORMAL HIGH (ref 0.3–1.2)
Total Protein: 7 g/dL (ref 6.5–8.1)

## 2019-02-22 LAB — PROTIME-INR
INR: 4.54
Prothrombin Time: 42.3 seconds — ABNORMAL HIGH (ref 11.4–15.2)

## 2019-02-22 LAB — GLUCOSE, CAPILLARY
Glucose-Capillary: 140 mg/dL — ABNORMAL HIGH (ref 70–99)
Glucose-Capillary: 175 mg/dL — ABNORMAL HIGH (ref 70–99)
Glucose-Capillary: 94 mg/dL (ref 70–99)
Glucose-Capillary: 160 mg/dL — ABNORMAL HIGH (ref 70–99)
Glucose-Capillary: 146 mg/dL — ABNORMAL HIGH (ref 70–99)

## 2019-02-22 MED ORDER — METOPROLOL SUCCINATE ER 100 MG PO TB24: 100.0000 mg | ORAL_TABLET | Freq: Every day | ORAL | Status: DC

## 2019-02-22 MED ORDER — CINACALCET HCL 30 MG PO TABS
90.0000 mg | ORAL_TABLET | Freq: Every day | ORAL | Status: DC
  Administered 2019-02-22 – 2019-02-23 (×2): 90 mg via ORAL
  Filled 2019-02-22 (×2): qty 3

## 2019-02-22 MED ORDER — GABAPENTIN 100 MG PO CAPS
100.0000 mg | ORAL_CAPSULE | ORAL | Status: DC | PRN
  Administered 2019-02-22: 100 mg via ORAL
  Filled 2019-02-22: qty 1

## 2019-02-22 MED ORDER — FLUTICASONE PROPIONATE 50 MCG/ACT NA SUSP
1.0000 | Freq: Every day | NASAL | Status: DC
  Administered 2019-02-22: 1 via NASAL
  Filled 2019-02-22: qty 16

## 2019-02-22 MED ORDER — CHLORHEXIDINE GLUCONATE CLOTH 2 % EX PADS: 6.0000 | MEDICATED_PAD | Freq: Every day | CUTANEOUS | Status: DC

## 2019-02-22 MED ORDER — BENZONATATE 100 MG PO CAPS
100.0000 mg | ORAL_CAPSULE | Freq: Two times a day (BID) | ORAL | Status: DC | PRN
  Administered 2019-02-23 – 2019-02-24 (×3): 100 mg via ORAL
  Filled 2019-02-22 (×3): qty 1

## 2019-02-22 MED ORDER — RENA-VITE PO TABS
1.0000 | ORAL_TABLET | Freq: Every day | ORAL | Status: DC
  Administered 2019-02-22 – 2019-02-23 (×2): 1 via ORAL
  Filled 2019-02-22 (×2): qty 1

## 2019-02-22 MED ORDER — FERRIC CITRATE 1 GM 210 MG(FE) PO TABS
840.0000 mg | ORAL_TABLET | Freq: Three times a day (TID) | ORAL | Status: DC
  Administered 2019-02-22 – 2019-02-24 (×6): 840 mg via ORAL
  Filled 2019-02-22 (×8): qty 4

## 2019-02-22 MED ORDER — BICTEGRAVIR-EMTRICITAB-TENOFOV 50-200-25 MG PO TABS
1.0000 | ORAL_TABLET | Freq: Every day | ORAL | Status: DC
  Administered 2019-02-22 – 2019-02-24 (×3): 1 via ORAL
  Filled 2019-02-22 (×3): qty 1

## 2019-02-22 MED ORDER — METOPROLOL SUCCINATE ER 100 MG PO TB24
100.0000 mg | ORAL_TABLET | Freq: Every day | ORAL | Status: DC
  Administered 2019-02-22: 100 mg via ORAL
  Filled 2019-02-22: qty 1

## 2019-02-22 NOTE — Progress Notes (Addendum)
   Subjective: Patient was seen and evaluated at bedside on morning rounds. No acute events overnight. He looks better, and mentions that he slept better than before last night. He did not get the CPAP but when asked him about nasal O2, he thinks it helped him to sleep better. We talked about having nephrology come and see him. He agrees with plan.  Objective:  Vital signs in last 24 hours: Vitals:   02/21/19 1935 02/21/19 2335 02/22/19 0200 02/22/19 0340  BP: (!) 145/103 (!) 140/96 (!) 137/107 128/87  Pulse: 91 82 (!) 116 76  Resp: 18 20 (!) 22 18  Temp: 97.9 F (36.6 C) (!) 97.5 F (36.4 C)  98.3 F (36.8 C)  TempSrc: Oral Oral  Oral  SpO2: 100% 98% 100% 100%  Weight:    123 kg  Height:       Vital signs reviewed General: Sitting on the bed in no acute distress, well developed and well nourished. Head: Normocephalic and atraumatic. CV: Normal rate, irregular rhythm, no murmur, has JVD Lungs: No respiratory distress on nasal O2, has bilateral mild fine crackle at bases. Abdomen: Is soft, nontender but distended, bowel sounds are present Extremities: Bilateral BKA Neurology: Alert and oriented x3 Psychiatric: Normal mood and affect  Assessment/Plan:  Active Problems:   Insomnia   53 year old male past medical history of ESRD on hemodialysis (TTS), IV, DM 2, HTN, paroxysmal A. Fib on Warfarin, nonischemic cardiomyopathy, presented to ED due to problem sleeping at night and fatigue during the day.  Insomnia Likely due to probable OSA and fluid overload in setting of ESRD (on HD) and HFrEF.  He has some volume overload on exam today and Chest x-ray on admission, showed mild pulmonary venous congestion and small pleural effusion.  Nephrology consulted and saw the patient today and suspect fluid overload is contributing to symptoms. Plan is doing extra dialysis treatment today with goal of 3-4L as tolerated. -HD today -Nephrology is on board, we will follow  recommendation -Try CPAP tonight   A fib: Rate is  Control. Patient is asymptomatic. Warfarin has held by Pharmacy due to elevated INR at 3.6 yesterday and 4.5 today.  -INR monitoring -Adjusting Warfarin per pharmacy   CAD: Stable. Patient without acute symptoms before and during admission.   HFrEF (EF 35-40%)  Cardiac Cath 01/25/2019 Prox LAD lesion 30% stenosed, Mid LAD lesion 80% stenosed, Dist LAD lesion 99% stenosed, Ost 1st Diag lesion 100% stenosed, Ost 3rd Diag lesion is 95%, stenosed. On Metoprolol succinate 100 mg QD, Crestor 20 mg QD, (Ranolzain discontinued recently) at home. He also has worsening of trans aminitis today (AST 215, ALT 194 and elevated total Bili that is likely due to congestion. We will repeat daily. (No icter, No abdominal tenderness, elevated INR is due to Warfarin therapy and not concerning for liver failure)  -Continue ASA 81 mg QD -Held BP due to low BP. Today BP 137/107. He will get HD today, so I do not resume his BP meds today. May resume tomorrow if BP elevated. -CMP daily  Dispo: Anticipated discharge in approximately 2-3 days  Dewayne Hatch, MD 02/22/2019, 6:33 AM Pager: [phone]

## 2019-02-22 NOTE — Progress Notes (Signed)
This encounter was created in error - please disregard.

## 2019-02-22 NOTE — Progress Notes (Signed)
  Date: 02/22/2019  Patient name: Thomas [name]  Medical record number: [id number]  Date of birth: [birth year]   I have seen and evaluated Thomas [name] and discussed their care with the Residency Team.  In brief, patient is a 53 year old male with a past medical history of end-stage renal disease on hemodialysis, type 2 diabetes, hypertension, paroxysmal A. fib on anticoagulation, CAD, chronic systolic heart failure (EF 35 to 40%) who presented to the ED with difficulty sleeping over the past month.  Patient states that over the past month he has noted difficulty with sleeping especially with laying flat as well as waking up short of breath about 20 minutes after he goes to sleep.  Patient also complains of some dyspnea on exertion as well.  Patient also notes feeling tired and lethargic during the day.  Patient also complains of a nonproductive cough over the last couple of weeks.  No fevers or chills, no myalgias, no headache, no blurry vision, no lightheadedness, no syncope, no palpitations, no nausea or vomiting, no diarrhea, no abdominal pain.  Today patient states that he feels lightly better and that he slept better overnight.  PMHx, Fam Hx, and/or Soc Hx : As per resident admit note  Vitals:   02/22/19 1220 02/22/19 1238  BP: 127/68 138/85  Pulse: 86 87  Resp: (!) 22 17  Temp: 97.9 F (36.6 C)   SpO2: 100%    General: Awake, alert, x3, NAD CVS: Regular rate and rhythm, normal heart sounds Lungs: Fine crackles noted bilateral bases Abdomen: Soft, nontender, nondistended, normoactive bowel sounds Extremities: Bilateral BKA, no edema noted  Assessment and Plan: I have seen and evaluated the patient as outlined above. I agree with the formulated Assessment and Plan as detailed in the residents' note, with the following changes:   1.  Shortness of breath: -Patient presented to ED with difficulty sleeping.  Patient does complain of orthopnea and episodes of paroxysmal nocturnal dyspnea  as well as a dry cough.  Chest x-ray showed mild pulmonary vascular congestion.  I suspect that he does have a component of fluid overload contributing to his symptoms.  However, patient's main problem appears to be sleep apnea and he will need follow-up for an outpatient sleep study. -Nephrology follow-up and recommendations appreciated -Patient to go for an extra hemodialysis session today with a target of 3 to 4 L off -We will attempt to try CPAP with the patient again tonight -No further work-up at this time -Patient will likely be [place] home tomorrow if he continues to improve  Aldine Contes, MD 2/24/20202:28 PM

## 2019-02-22 NOTE — Plan of Care (Signed)
  Problem: Clinical Measurements: Goal: Will remain free from infection Outcome: Progressing   Problem: Activity: Goal: Risk for activity intolerance will decrease Outcome: Progressing   Problem: Elimination: Goal: Will not experience complications related to bowel motility Outcome: Progressing   Problem: Safety: Goal: Ability to remain free from injury will improve Outcome: Progressing   

## 2019-02-22 NOTE — Consult Note (Addendum)
[facility] Renal Consultation Note    Indication for Consultation:  Management of ESRD/hemodialysis, anemia, hypertension/volume, and secondary hyperparathyroidism. PCP:  HPI: Thomas [name] is a 53 y.o. male with a history of ESRD, T2DM, paroxysmal a.fib on warfarin, CAD, HFrEF, who presented to the ED yesterday for shortness of breath while sleeping. Patient states "I think I have sleep apnea." Reports he frequently wakes up with difficulty breathing and feels he needs to catch his breath. This has been occurring for approximately 6 weeks. He reports difficulty laying flat and progressive dyspnea with exertion. Reports he has been feeling very tired with low energy for the past month. He had been referred for sleep study as an outpatient but reportedly has not yet had the study. He denies any chest pain, palpitations, fever/chills, N/V/D, or peripheral edema.  Has been compliant with dialysis and has been getting slightly below his EDW of 122kg, with minimal gains between treatments. Last HD was 02/20/2019.   On presentation to the ED, O2 sats in the 80's on room air, improved to 95% on 4L O2. Hgb 9.5, K 3.6, Ca 10.7, albumin 3.7 and LFTs slightly elevated. Reports he continues to feel short of breath whenever he tries to sleep.  Chest x-ray showed pulmonary interstitial prominence with low lung volumes and mild pulmonary venous congestion. Usually does well on HD. His main concern is lack of sleep.  Past Medical History:  Diagnosis Date  . Anemia   . Ascending aortic aneurysm ([place])   . CAD (coronary artery disease), native coronary artery 01/22/2019  . Chronic combined systolic and diastolic CHF (congestive heart failure) ([place])   . Diabetic foot ulcer ([place]) 09/03/2017  . Diabetic wet gangrene of the foot ([place]) 09/04/2017  . ESRD (end stage renal disease) on dialysis [facility])    [place] [address] T, [place], [place] (10/15/2018)  . Headache    "monthly" (06/15/2018)  . HIV disease ([place])   .  Hyperlipidemia LDL goal <70 01/22/2019  . Hyperparathyroidism, secondary ([place])   . Hypertension   . Hypertensive heart disease with end stage renal disease on dialysis ([place]) 03/13/2017  . Mallory-Weiss tear 2018  . PAF (paroxysmal atrial fibrillation) ([place])   . Paroxysmal atrial flutter ([place])   . Pneumonia X 1  . QT prolongation   . Subacute osteomyelitis, right ankle and foot ([place])   . SVT (supraventricular tachycardia) ([place])    ? afib or atrial flutter s/p TEE/DCCV with subsequent ablation due to reoccurrence in [place]  . Type 2 diabetes mellitus ([place])    Past Surgical History:  Procedure Laterality Date  . AMPUTATION Left    foot  . AMPUTATION Right 12/06/2017   Procedure: AMPUTATION BELOW KNEE;  Surgeon: [name], MD;  Location: [place];  Service: Orthopedics;  Laterality: Right;  . APPLICATION OF WOUND VAC Right 09/04/2017   Procedure: APPLICATION OF WOUND VAC;  Surgeon: [name], DPM;  Location: [place];  Service: Podiatry;  Laterality: Right;  . APPLICATION OF WOUND VAC  12/06/2017   Procedure: APPLICATION OF WOUND VAC;  Surgeon: [name], MD;  Location: [place];  Service: Orthopedics;;  . AV FISTULA PLACEMENT Right 10/19/2012  . AV FISTULA REPAIR Right ~ 02/2018   "had it cleaned out"  . BELOW KNEE LEG AMPUTATION Left ~ 2016  . COLONOSCOPY WITH PROPOFOL N/A 07/30/2017   Procedure: COLONOSCOPY WITH PROPOFOL;  Surgeon: [name], MD;  Location: [place];  Service: Endoscopy;  Laterality: N/A;  . ESOPHAGOGASTRODUODENOSCOPY (  EGD) WITH PROPOFOL N/A 07/28/2017   Procedure: ESOPHAGOGASTRODUODENOSCOPY (EGD) WITH PROPOFOL;  Surgeon: [name], MD;  Location: [place];  Service: Endoscopy;  Laterality: N/A;  . ESOPHAGOGASTRODUODENOSCOPY (EGD) WITH PROPOFOL N/A 01/15/2018   Procedure: ESOPHAGOGASTRODUODENOSCOPY (EGD) WITH PROPOFOL;  Surgeon: [name], MD;  Location: [facility] ENDOSCOPY;  Service: Endoscopy;  Laterality: N/A;  . FLEXIBLE SIGMOIDOSCOPY N/A 07/27/2017    Procedure: FLEXIBLE SIGMOIDOSCOPY;  Surgeon: [name], MD;  Location: [facility] ENDOSCOPY;  Service: Endoscopy;  Laterality: N/A;  . GRAFT APPLICATION Right [id number]/23/5573   Procedure: SKIN GRAFT APPLICATION RIGHT FOOT;  Surgeon: [name], DPM;  Location: [place];  Service: Podiatry;  Laterality: Right;  . I&D EXTREMITY Right 09/04/2017   Procedure: IRRIGATION AND DEBRIDEMENT EXTREMITY;  Surgeon: [name], DPM;  Location: [place];  Service: Podiatry;  Laterality: Right;  . I&D EXTREMITY Right 11/12/2017   Procedure: IRRIGATION AND DEBRIDEMENT ULCER RIGHT FOOT;  Surgeon: [name], DPM;  Location: [place];  Service: Podiatry;  Laterality: Right;  . LEFT HEART CATH AND CORONARY ANGIOGRAPHY N/A 06/18/2018   Procedure: LEFT HEART CATH AND CORONARY ANGIOGRAPHY;  Surgeon: [name], Peter M, MD;  Location: [place] CV LAB;  Service: Cardiovascular;  Laterality: N/A;  . LEFT HEART CATH AND CORONARY ANGIOGRAPHY N/A 01/25/2019   Procedure: LEFT HEART CATH AND CORONARY ANGIOGRAPHY;  Surgeon: [name], Peter M, MD;  Location: [place] CV LAB;  Service: Cardiovascular;  Laterality: N/A;  . WOUND DEBRIDEMENT N/A 09/24/2017   Procedure: DEBRIDEMENT WOUND;  Surgeon: [name], DPM;  Location: [place];  Service: Podiatry;  Laterality: N/A;   Family History  Problem Relation Age of Onset  . Heart disease Mother   . Diabetes Mother   . Heart disease Father   . Cancer Neg Hx    Social History:  reports that he has never smoked. He has never used smokeless tobacco. He reports that he does not drink alcohol or use drugs.  ROS: As per HPI otherwise negative.  Physical Exam: Vitals:   02/21/19 1935 02/21/19 2335 02/22/19 0200 02/22/19 0340  BP: (!) 145/103 (!) 140/96 (!) 137/107 128/87  Pulse: 91 82 (!) 116 76  Resp: 18 20 (!) 22 18  Temp: 97.9 F (36.6 C) (!) 97.5 F (36.4 C)  98.3 F (36.8 C)  TempSrc: Oral Oral  Oral  SpO2: 100% 98% 100% 100%  Weight:    123 kg  Height:          General: Well developed, obese male. Appears tired. Falls asleep talking Head: Normocephalic, atraumatic, sclera non-icteric, mucus membranes are moist. Neck: Supple without Betsy Pries. JVD elevated. PCL Lungs: On O2 via nasal cannula. +crackles with decreased breath sound b/l lower lobes. No wheezing, respirations even and unlabored. Bilat  Decreased bs.  Heart: RRR with normal S1, S2. Gr 2/6 SEM ABDM with normoactive bowel sounds. No rebound/guarding. No obvious abdominal masses. Liver down 5 cm, obese. Musculoskeletal:  Bilateral BKA. Lower extremities: No edema, no open wounds. Neuro: Alert and oriented X 3. Moves all extremities spontaneously. Psych:  Responds to questions appropriately with a normal affect. Dialysis Access: R forearm AVF, + thrill and bruit  Allergies  Allergen Reactions  . Oxycodone-Acetaminophen Other (See Comments)    Hallucinations/delirum   Prior to Admission medications   Medication Sig Start Date End Date Taking? Authorizing Provider  acetaminophen (TYLENOL) 325 MG tablet Take 650 mg by mouth every 6 (six) hours as needed (for headaches).    Yes [provider]  amiodarone (PACERONE) 200 MG tablet Take 1 tablet (200 mg total) by mouth daily. Patient taking differently: Take 200 mg by mouth at bedtime.  08/05/18  Yes Lyda Jester M, PA-C  aspirin 81 MG chewable tablet Chew 1 tablet (81 mg total) by mouth daily. 01/26/19  Yes Helberg, Larkin Ina, MD  BIKTARVY 50-200-25 MG TABS tablet TAKE 1 TABLET BY MOUTH DAILY. Patient taking differently: Take 1 tablet by mouth daily.  02/08/19  Yes Carlyle Basques, MD  camphor-menthol [facility]) lotion Apply topically as needed for itching. Patient taking differently: Apply 1 application topically daily as needed for itching.  12/11/17  Yes Colbert Ewing, MD  cinacalcet (SENSIPAR) 90 MG tablet Take 90 mg by mouth daily. 04/27/18  Yes [provider]  ferric citrate (AURYXIA) 1 GM 210 MG(Fe) tablet Take 420-840  mg by mouth See admin instructions. Take 840 mg by mouth three times a day after meals and 420 mg after each snack   Yes [provider]  gabapentin (NEURONTIN) 100 MG capsule Take 1 capsule (100 mg total) by mouth every dialysis. 01/09/18  Yes Jamse Arn, MD  insulin glargine (LANTUS) 100 unit/mL SOPN Inject 0.3 mLs (30 Units total) into the skin at bedtime. Patient taking differently: Inject 20 Units into the skin at bedtime.  12/19/17  Yes Angiulli, Lavon Paganini, PA-C  insulin lispro (HUMALOG) 100 UNIT/ML KiwkPen Please inject 10 units, twice daily, ~10 min before your two largest meals of the day. Patient taking differently: Inject 10 Units into the skin See admin instructions. Inject 10 units into the skin two times a day- 10 minutes before the 2 largest meals of the day 10/29/18  Yes Kathi Ludwig, MD  linagliptin (TRADJENTA) 5 MG TABS tablet Take 1 tablet (5 mg total) by mouth daily. 12/07/18  Yes Helberg, Larkin Ina, MD  metoprolol succinate (TOPROL-XL) 100 MG 24 hr tablet TAKE 1 TABLET BY MOUTH ONCE DAILY TAKE WITH OR IMMEDIATELY FOLLOWING A MEAL Patient taking differently: Take 100 mg by mouth daily. TAKE WITH OR IMMEDIATELY FOLLOWING A MEAL 08/05/18  Yes Lyda Jester M, PA-C  nitroGLYCERIN (NITROSTAT) 0.4 MG SL tablet Place 1 tablet (0.4 mg total) under the tongue every 5 (five) minutes as needed for chest pain. 01/29/19  Yes Dorrell, Andree Elk, MD  omega-3 acid ethyl esters (LOVAZA) 1 g capsule Take 2 capsules (2 g total) by mouth 2 (two) times daily. 02/03/19  Yes Turner, Eber Hong, MD  pantoprazole (PROTONIX) 40 MG tablet Take 2 tablets (80 mg total) by mouth daily. Patient taking differently: Take 40 mg by mouth 2 (two) times daily.  02/16/18  Yes Alphonzo Grieve, MD  ranolazine (RANEXA) 500 MG 12 hr tablet Take 1 tablet (500 mg total) by mouth 2 (two) times daily. 01/29/19  Yes Dorrell, Andree Elk, MD  rosuvastatin (CRESTOR) 20 MG tablet Take 0.5 tablets (10 mg total) by mouth  daily. 01/29/19  Yes Dorrell, Andree Elk, MD  Skin Protectants, Misc. (EUCERIN) cream Apply topically as needed for dry skin. 02/08/19  Yes Welford Roche, MD  warfarin (COUMADIN) 7.5 MG tablet TAKE 1/2 TABLET BY MOUTH DAILY, EXCEPT 1 TABLET ON Monday, Wednesday, AND Saturday OR AS DIRECTED BY ANTICOAGULATION CLINIC. Patient taking differently: Take 3.75-7.5 mg by mouth at bedtime. Take 3.75 mg by mouth at bedtime on Sun/Tues/Thurs/Fri and 7.5 mg on Mon/Wed/Sat 12/25/18  Yes Turner, Traci R, MD  ferric gluconate 125 mg in sodium chloride 0.9 % 100 mL Inject 125 mg into the vein Every Tuesday,Thursday,and  Saturday with dialysis. Patient not taking: Reported on 01/22/2019 05/10/17   Hosie Poisson, MD  polycarbophil (FIBERCON) 625 MG tablet Take 1 tablet (625 mg total) by mouth daily. Patient not taking: Reported on 02/21/2019 12/19/17   Cathlyn Parsons, PA-C   Current Facility-Administered Medications  Medication Dose Route Frequency Provider Last Rate Last Dose  . acetaminophen (TYLENOL) tablet 650 mg  650 mg Oral Q6H PRN Rehman, Areeg N, DO   650 mg at 02/21/19 2345  . amiodarone (PACERONE) tablet 200 mg  200 mg Oral QHS Katherine Roan, MD   200 mg at 02/21/19 2113  . aspirin chewable tablet 81 mg  81 mg Oral Daily Katherine Roan, MD   81 mg at 02/22/19 0851  . bictegravir-emtricitabine-tenofovir AF (BIKTARVY) 50-200-25 MG per tablet 1 tablet  1 tablet Oral Daily Winfrey, Jenne Pane, MD      . Chlorhexidine Gluconate Cloth 2 % PADS 6 each  6 each Topical Q0600 Collins, Samantha G, PA-C      . gabapentin (NEURONTIN) capsule 100 mg  100 mg Oral Q dialysis Winfrey, Jenne Pane, MD      . insulin aspart (novoLOG) injection 0-5 Units  0-5 Units Subcutaneous QHS Guadlupe Spanish B, MD      . insulin aspart (novoLOG) injection 0-9 Units  0-9 Units Subcutaneous TID WC Katherine Roan, MD   2 Units at 02/22/19 [phone]  . insulin aspart (novoLOG) injection 6 Units  6 Units Subcutaneous TID WC  Katherine Roan, MD   6 Units at 02/22/19 [phone]  . insulin glargine (LANTUS) injection 20 Units  20 Units Subcutaneous QHS Katherine Roan, MD   20 Units at 02/21/19 2258  . metoprolol succinate (TOPROL-XL) 24 hr tablet 100 mg  100 mg Oral Daily Katherine Roan, MD   100 mg at 02/22/19 0851  . Warfarin - Pharmacist Dosing Inpatient   Does not apply [id number] Aldine Contes, MD   Stopped at 02/21/19 1800   Labs: Basic Metabolic Panel: Recent Labs  Lab 02/21/19 1130 02/22/19 0645  NA 136 135  K 3.6 3.8  CL 96* 97*  CO2 26 24  GLUCOSE 220* 188*  BUN 23* 35*  CREATININE 6.74* 8.56*  CALCIUM 10.7* 10.5*   Liver Function Tests: Recent Labs  Lab 02/21/19 1130 02/22/19 0645  AST 66* 215*  ALT 56* 194*  ALKPHOS 96 123  BILITOT 2.6* 3.2*  PROT 7.4 7.0  ALBUMIN 3.7 3.5   CBC: Recent Labs  Lab 02/21/19 1130 02/22/19 0645  WBC 7.6 8.8  HGB 9.5* 9.3*  HCT 31.4* 29.2*  MCV 101.3* 99.3  PLT 216 220   CBG: Recent Labs  Lab 02/21/19 1528 02/21/19 1710 02/21/19 2102 02/22/19 0240 02/22/19 0742  GLUCAP 182* 228* 171* 140* 175*   Studies/Results: Dg Chest Port 1 View  Result Date: 02/21/2019 CLINICAL DATA:  Shortness of breath.  Chest pressure.  On dialysis. EXAM: PORTABLE CHEST 1 VIEW COMPARISON:  01/28/2019 FINDINGS: Mildly degraded exam due to AP portable technique and patient body habitus. Midline trachea. Moderate cardiomegaly. Superior mediastinal soft tissue fullness is likely technique related. Cannot exclude small bilateral pleural effusions. No pneumothorax. Similar pulmonary interstitial prominence with thickening of the right minor fissure. IMPRESSION: Similar pulmonary interstitial prominence. Likely a combination of low lung volumes/patient body habitus and mild pulmonary venous congestion. No overt congestive failure. Electronically Signed   By: Abigail Miyamoto M.D.   On: 02/21/2019 12:30    Dialysis Orders: Center: [facility]  on TTS . Time: 4h 57min; EDW  122kg. BFR 500 DFR 800 2K, 3.5 Ca; UF profile 2 Hecterol: 6 mcg IVP 3x week during dialysis Mircera: 36mcg IV q2 weeks (last dose 01/31/2019) Heparin: None Sensipar: 90mg  PO once daily Binder: Auryxia 210mg  4 tabs TID with meals  Recent labs 02/18/2019: Hgb 9.2, K 3.9, Ca 9.6, Phos 4.2  Assessment/Plan: 1.  Difficulty sleeping:+ Orthopnea and PND. Chest x-ray showed mild pulmonary venous congestion. Suspect fluid overload is contributing to symptoms. Will plan for extra dialysis treatment today with goal of 3-4L as tolerated. Per primary, tried CPAP last night and will follow up sleep study as an outpatient. Primary issue is OSA 2.  ESRD:  Outpatient schedule TTS. Plan for extra treatment today.  K 3.8. 3K/ 2.5 Ca bath. strd hep. Vol xs, reset dry. 3.  Hypertension/volume: Blood pressure meds being held per primary. + Orthopnea and PND. Plan for extra HD today with UFG of 3-4L.  4.  Anemia: Hgb 9.3. No bleeding reported. Not due for ESA yet. Follow.Start ESA 5.  Metabolic bone disease: Ca elevated, was on 3.5Ca bath as outpatient, will use 2.0 Ca bath. Hold hecterol. Last outpatient phos 4.2. Continue sensipar and home binders.  6.  Nutrition:  Albumin 3.5, will start pro-stat supplement 7. Diabetes mellitus: Insulin per primary team 8. CAD: No chest pain at present. BP meds held for low BP per primary team. 9. A.fib: Currently rate controlled, on coumadin with elevated INR- management per pharm/ primary team 10. Obesity 11. OSA   Anice Paganini, PA-C 02/22/2019, 9:52 AM  [facility] Pager: [phone] I have seen and examined this patient and agree with the plan of care seen, eval, examined ,counseled patient .  Jeneen Rinks Catherine Cubero 02/22/2019, 1:45 PM

## 2019-02-22 NOTE — Procedures (Signed)
I was present at this session.  I have reviewed the session itself and made appropriate changes. Hd via RUA avf . bp tol vol off.  Access press ok. lethargic Mauricia Area 2/24/20201:39 PM

## 2019-02-22 NOTE — Progress Notes (Signed)
ANTICOAGULATION CONSULT NOTE - Initial Consult  Pharmacy Consult for warfarin Indication: atrial fibrillation   Vital Signs: Temp: 98.3 F (36.8 C) (02/24 0340) Temp Source: Oral (02/24 0340) BP: 128/87 (02/24 0340) Pulse Rate: 76 (02/24 0340)  Labs: Recent Labs    02/21/19 1130 02/22/19 0532 02/22/19 0645  HGB 9.5*  --  9.3*  HCT 31.4*  --  29.2*  PLT 216  --  220  LABPROT 36.0* 42.3*  --   INR 3.69 4.54*  --   CREATININE 6.74*  --  8.56*    Medical History: Past Medical History:  Diagnosis Date  . Anemia   . Ascending aortic aneurysm ([place])   . CAD (coronary artery disease), native coronary artery 01/22/2019  . Chronic combined systolic and diastolic CHF (congestive heart failure) ([place])   . Diabetic foot ulcer ([place]) 09/03/2017  . Diabetic wet gangrene of the foot ([place]) 09/04/2017  . ESRD (end stage renal disease) on dialysis [facility])    [place] [address] T, [place], [place] (10/15/2018)  . Headache    "monthly" (06/15/2018)  . HIV disease ([place])   . Hyperlipidemia LDL goal <70 01/22/2019  . Hyperparathyroidism, secondary ([place])   . Hypertension   . Hypertensive heart disease with end stage renal disease on dialysis ([place]) 03/13/2017  . Mallory-Weiss tear 2018  . PAF (paroxysmal atrial fibrillation) ([place])   . Paroxysmal atrial flutter ([place])   . Pneumonia X 1  . QT prolongation   . Subacute osteomyelitis, right ankle and foot ([place])   . SVT (supraventricular tachycardia) ([place])    ? afib or atrial flutter s/p TEE/DCCV with subsequent ablation due to reoccurrence in [place]  . Type 2 diabetes mellitus (HCC)     Assessment: Thomas [name] is a 53 yo male with hx of AFib on warfarin PTA. INR remains supratherapeutic and is trending up. INR 3.69 > 4.5.   PTA dose: warfarin 3.75 mg by mouth at bedtime on Sun/Tues/Thurs/Fri and 7.5 mg on Mon/Wed/Sat   Goal of Therapy:  Heparin level 0.3-0.7 units/ml Monitor platelets by anticoagulation protocol: Yes    Plan:  -Hold warfarin  -Daily  INR   Harvel Quale 02/22/2019 9:11 AM

## 2019-02-22 NOTE — Progress Notes (Signed)
INR 4.54, MD notified, will cont to monitor, Thanks Arvella Nigh RN.

## 2019-02-22 NOTE — Progress Notes (Signed)
Inpatient Diabetes Program Recommendations  AACE/ADA: New Consensus Statement on Inpatient Glycemic Control (2015)  Target Ranges:  Prepandial:   less than 140 mg/dL      Peak postprandial:   less than 180 mg/dL (1-2 hours)      Critically ill patients:  140 - 180 mg/dL   Lab Results  Component Value Date   GLUCAP 94 02/22/2019   HGBA1C 11.9 (H) 01/23/2019    Review of Glycemic Control  Diabetes history: DM2 Outpatient Diabetes medications: Lantus 20 units + Novolog 10 units bid ac largest 2 meals + Tradjenta 5 mg Current orders for Inpatient glycemic control: Lantus 20 units + Novolog 6 units tid + Novolog sensitive correction scale tid + hs  Inpatient Diabetes Program Recommendations:   DM coordinator met with patient and wife to discuss elevated A1c of 11.9 (01/23/19) on 01/25/19. Agree with current orders.  Thank you, Nani Gasser. Manha Amato, RN, MSN, CDE  Diabetes Coordinator Inpatient Glycemic Control Team Team Pager [phone] (8am-5pm) 02/22/2019 2:56 PM

## 2019-02-23 ENCOUNTER — Other Ambulatory Visit (HOSPITAL_COMMUNITY): Payer: Medicare Other

## 2019-02-23 DIAGNOSIS — R740 Nonspecific elevation of levels of transaminase and lactic acid dehydrogenase [LDH]: Secondary | ICD-10-CM

## 2019-02-23 LAB — RENAL FUNCTION PANEL
Albumin: 3.6 g/dL (ref 3.5–5.0)
Anion gap: 12 (ref 5–15)
BUN: 16 mg/dL (ref 6–20)
CO2: 27 mmol/L (ref 22–32)
Calcium: 9.3 mg/dL (ref 8.9–10.3)
Chloride: 97 mmol/L — ABNORMAL LOW (ref 98–111)
Creatinine, Ser: 4.35 mg/dL — ABNORMAL HIGH (ref 0.61–1.24)
GFR calc Af Amer: 17 mL/min — ABNORMAL LOW (ref >60)
GFR calc non Af Amer: 15 mL/min — ABNORMAL LOW (ref >60)
Glucose, Bld: 167 mg/dL — ABNORMAL HIGH (ref 70–99)
Phosphorus: 1.6 mg/dL — ABNORMAL LOW (ref 2.5–4.6)
Potassium: 3.3 mmol/L — ABNORMAL LOW (ref 3.5–5.1)
Sodium: 136 mmol/L (ref 135–145)

## 2019-02-23 LAB — GLUCOSE, CAPILLARY
GLUCOSE-CAPILLARY: 181 mg/dL — AB (ref 70–99)
GLUCOSE-CAPILLARY: 117 mg/dL — AB (ref 70–99)
Glucose-Capillary: 199 mg/dL — ABNORMAL HIGH (ref 70–99)
Glucose-Capillary: 157 mg/dL — ABNORMAL HIGH (ref 70–99)
Glucose-Capillary: 237 mg/dL — ABNORMAL HIGH (ref 70–99)

## 2019-02-23 LAB — CBC
HCT: 32 % — ABNORMAL LOW (ref 39.0–52.0)
Hemoglobin: 9.9 g/dL — ABNORMAL LOW (ref 13.0–17.0)
MCH: 31.7 pg (ref 26.0–34.0)
MCHC: 30.9 g/dL (ref 30.0–36.0)
MCV: 102.6 fL — ABNORMAL HIGH (ref 80.0–100.0)
PLATELETS: 188 10*3/uL (ref 150–400)
RBC: 3.12 MIL/uL — ABNORMAL LOW (ref 4.22–5.81)
RDW: 18 % — AB (ref 11.5–15.5)
WBC: 8.1 10*3/uL (ref 4.0–10.5)
nRBC: 1.2 % — ABNORMAL HIGH (ref 0.0–0.2)

## 2019-02-23 LAB — COMPREHENSIVE METABOLIC PANEL
ALT: 415 U/L — ABNORMAL HIGH (ref 0–44)
AST: 346 U/L — ABNORMAL HIGH (ref 15–41)
Albumin: 3.4 g/dL — ABNORMAL LOW (ref 3.5–5.0)
Alkaline Phosphatase: 125 U/L (ref 38–126)
Anion gap: 15 (ref 5–15)
BUN: 30 mg/dL — ABNORMAL HIGH (ref 6–20)
CO2: 25 mmol/L (ref 22–32)
Calcium: 9.6 mg/dL (ref 8.9–10.3)
Chloride: 97 mmol/L — ABNORMAL LOW (ref 98–111)
Creatinine, Ser: 6.98 mg/dL — ABNORMAL HIGH (ref 0.61–1.24)
GFR calc Af Amer: 10 mL/min — ABNORMAL LOW (ref >60)
GFR calc non Af Amer: 8 mL/min — ABNORMAL LOW (ref >60)
Glucose, Bld: 200 mg/dL — ABNORMAL HIGH (ref 70–99)
Potassium: 3.7 mmol/L (ref 3.5–5.1)
Sodium: 137 mmol/L (ref 135–145)
TOTAL PROTEIN: 6.8 g/dL (ref 6.5–8.1)
Total Bilirubin: 2 mg/dL — ABNORMAL HIGH (ref 0.3–1.2)

## 2019-02-23 LAB — PROTIME-INR
INR: 2.9 — ABNORMAL HIGH (ref 0.8–1.2)
Prothrombin Time: 30.2 seconds — ABNORMAL HIGH (ref 11.4–15.2)

## 2019-02-23 LAB — HEPATITIS B SURFACE ANTIGEN: Hepatitis B Surface Ag: NEGATIVE

## 2019-02-23 MED ORDER — METOPROLOL SUCCINATE ER 25 MG PO TB24: 125.0000 mg | ORAL_TABLET | Freq: Every day | ORAL | Status: DC

## 2019-02-23 MED ORDER — HEPARIN SODIUM (PORCINE) 1000 UNIT/ML DIALYSIS
1000.0000 [IU] | INTRAMUSCULAR | Status: DC | PRN
  Filled 2019-02-23: qty 1

## 2019-02-23 MED ORDER — PENTAFLUOROPROP-TETRAFLUOROETH EX AERO: 1.0000 "application " | INHALATION_SPRAY | CUTANEOUS | Status: DC | PRN

## 2019-02-23 MED ORDER — LIDOCAINE-PRILOCAINE 2.5-2.5 % EX CREA
1.0000 "application " | TOPICAL_CREAM | CUTANEOUS | Status: DC | PRN
  Filled 2019-02-23: qty 5

## 2019-02-23 MED ORDER — SODIUM CHLORIDE 0.9 % IV SOLN: 100.0000 mL | INTRAVENOUS | Status: DC | PRN

## 2019-02-23 MED ORDER — METOPROLOL SUCCINATE ER 100 MG PO TB24
100.0000 mg | ORAL_TABLET | Freq: Every day | ORAL | Status: DC
  Administered 2019-02-23: 100 mg via ORAL
  Filled 2019-02-23: qty 1

## 2019-02-23 MED ORDER — CHLORHEXIDINE GLUCONATE CLOTH 2 % EX PADS: 6.0000 | MEDICATED_PAD | Freq: Every day | CUTANEOUS | Status: DC

## 2019-02-23 MED ORDER — LIDOCAINE-PRILOCAINE 2.5-2.5 % EX CREA: 1.0000 "application " | TOPICAL_CREAM | CUTANEOUS | Status: DC | PRN

## 2019-02-23 MED ORDER — LIDOCAINE HCL (PF) 1 % IJ SOLN: 5.0000 mL | INTRAMUSCULAR | Status: DC | PRN

## 2019-02-23 MED ORDER — WARFARIN SODIUM 2.5 MG PO TABS
3.7500 mg | ORAL_TABLET | Freq: Once | ORAL | Status: AC
  Administered 2019-02-23: 3.75 mg via ORAL
  Filled 2019-02-23: qty 1

## 2019-02-23 MED ORDER — ALTEPLASE 2 MG IJ SOLR: 2.0000 mg | Freq: Once | INTRAMUSCULAR | Status: DC | PRN

## 2019-02-23 MED ORDER — HEPARIN SODIUM (PORCINE) 1000 UNIT/ML DIALYSIS: 1000.0000 [IU] | INTRAMUSCULAR | Status: DC | PRN

## 2019-02-23 MED ORDER — METOPROLOL SUCCINATE ER 25 MG PO TB24: 25.0000 mg | ORAL_TABLET | Freq: Every day | ORAL | Status: DC

## 2019-02-23 MED ORDER — HEPARIN SODIUM (PORCINE) 1000 UNIT/ML DIALYSIS: 100.0000 [IU]/kg | INTRAMUSCULAR | Status: DC | PRN

## 2019-02-23 NOTE — Plan of Care (Signed)
  Problem: Education: Goal: Knowledge of General Education information will improve Description Including pain rating scale, medication(s)/side effects and non-pharmacologic comfort measures Outcome: Progressing   Problem: Health Behavior/Discharge Planning: Goal: Ability to manage health-related needs will improve Outcome: Progressing   

## 2019-02-23 NOTE — Progress Notes (Signed)
[place] for warfarin Indication: atrial fibrillation   Vital Signs: Temp: 98.3 F (36.8 C) (02/25 0800) Temp Source: Oral (02/25 0800) BP: 124/84 (02/25 0800) Pulse Rate: 91 (02/25 0800)  Labs: Recent Labs    02/21/19 1130 02/22/19 0532 02/22/19 0645 02/23/19 0438 02/23/19 0707  HGB 9.5*  --  9.3*  --   --   HCT 31.4*  --  29.2*  --   --   PLT 216  --  220  --   --   LABPROT 36.0* 42.3*  --  30.2*  --   INR 3.69 4.54*  --  2.9*  --   CREATININE 6.74*  --  8.56*  --  6.98*    Medical History: Past Medical History:  Diagnosis Date  . Anemia   . Ascending aortic aneurysm ([place])   . CAD (coronary artery disease), native coronary artery 01/22/2019  . Chronic combined systolic and diastolic CHF (congestive heart failure) ([place])   . Diabetic foot ulcer ([place]) 09/03/2017  . Diabetic wet gangrene of the foot ([place]) 09/04/2017  . ESRD (end stage renal disease) on dialysis [facility])    [place] [address] T, [place], [place] (10/15/2018)  . Headache    "monthly" (06/15/2018)  . HIV disease ([place])   . Hyperlipidemia LDL goal <70 01/22/2019  . Hyperparathyroidism, secondary ([place])   . Hypertension   . Hypertensive heart disease with end stage renal disease on dialysis ([place]) 03/13/2017  . Mallory-Weiss tear 2018  . PAF (paroxysmal atrial fibrillation) ([place])   . Paroxysmal atrial flutter ([place])   . Pneumonia X 1  . QT prolongation   . Subacute osteomyelitis, right ankle and foot ([place])   . SVT (supraventricular tachycardia) ([place])    ? afib or atrial flutter s/p TEE/DCCV with subsequent ablation due to reoccurrence in [place]  . Type 2 diabetes mellitus (HCC)     Assessment: Thomas [name] is a 53 yo male with hx of AFib on warfarin PTA. INR was supratherapeutic at admission 3.6 > 4.5, with no associated bleeding, and is now down to 2.9.   PTA dose: warfarin 3.75 mg by mouth at bedtime on Sun/Tues/Thurs/Fri and 7.5 mg on Mon/Wed/Sat   Goal of Therapy:  Heparin  level 0.3-0.7 units/ml Monitor platelets by anticoagulation protocol: Yes    Plan:  -warfarin 3.75 mg po x1 -Daily INR   Harvel Quale 02/23/2019 8:24 AM

## 2019-02-23 NOTE — Progress Notes (Addendum)
Subjective: Patient was seen and evaluated at bedside on morning rounds. No acute events overnight. He feels much better and was able to sleep better than before last night. He still endorses feeling sleeping during the day. He is upset about why he accumulated fluid despite being on hemodialysis. We explain about part of his symptoms was because of volume overload but also due to probable sleep apnea. We addressed his concern.   Objective:  Vital signs in last 24 hours: Vitals:   02/22/19 1653 02/22/19 1955 02/23/19 0321 02/23/19 0426  BP: (!) 115/55 118/87    Pulse: 82 (!) 37 90 (!) 113  Resp: 18 18  18   Temp: 98.7 F (37.1 C) 98 F (36.7 C)  97.7 F (36.5 C)  TempSrc: Oral   Oral  SpO2: 98% 100%  98%  Weight: 122.7 kg   120 kg  Height:       Physical Exam  Constitutional: Appears well-developed and well-nourished. No distress.  HENT:  Head: Normocephalic and atraumatic.  Eyes: Conjunctivae are normal.  Cardiovascular: Normal rate, regular rhythm and normal heart sounds.  Respiratory: Effort normal. Is on nasal O2 with O2 sat 100%. No respiratory distress.  bilateral mild fine crackle at bases improved. GI: Soft. Bowel sounds are normal. No distension. There is no tenderness.  Musculoskeletal: Bilateral BKA Neurological: Is alert but falls asleep briefly during physical exam.  Skin: Not diaphoretic. No erythema.  Psychiatric: Normal mood and affect. Behavior is normal. Judgment and thought content normal.    Assessment/Plan:  Active Problems:   Insomnia   ESRD (end stage renal disease) on dialysis ([place])  53 year old male past medical history of ESRD on hemodialysis(TTS),IV, DM 2, HTN, paroxysmal A. Fibon Warfarin, nonischemic cardiomyopathy,presented to ED due toproblem sleeping at nightand fatigue during the day.  Insomnia/PND Likely due to probable OSA and fluid overload in setting of ESRD (on HD) and HFrEF. Improved after HD.  He has some volume overload  on on admission and Chest x-ray, showed mild pulmonary venous congestion and small pleural effusion. Got extra HD yesterday.  He mentions he was able to sleep last night. Still tired during the day.   Could not tolerate CPAP last night.  Is on nasal O2 with O2 sat 100%. We do not think he needs home O2 as Saturation remained appropriate at 95% when turn off the nasal O2. Patient will be ok to discharge probably today. (if clear by nephrology and after having stable CMP result)  -Nephrology is on board, we will follow recommendation about HD today (inpatient vs out patient) -Refer to sleep study center for sleep study -f/u today CMP result  A fib: Rate elevated at 110-130 this AM. Patient is asymptomatic.Was off of Metoprolol due to hypotension. BP ok now. Will resume Metop now and will. INR came down to 2.7 today, within the goal now.  -INR monitoring -Warfarin per pharmacy -Continue home dose of Amiodaron -Resume Metoprolol -Continue home meds after discharge and follow up in clinic for INR check.  CAD: Stable. Patient without acute symptoms before and during admission.   HFrEF (EF 35-40%)  Cardiac Cath 01/25/2019 Prox LAD lesion 30% stenosed,Mid LAD lesion 80% stenosed,Dist LAD lesion 99% stenosed,Ost 1st Diag lesion 100% stenosed,Ost 3rd Diag lesion is 95%, stenosed. On Metoprolol succinate 100 mg QD, Crestor 20 mg QD,(Ranolzain discontinued recently) at home. He also has worsening of trans aminitis today (AST 215, ALT 194 and elevated total Bili that is likely due to congestion. We will repeat  daily. (No icter, No abdominal tenderness, elevated INR is due to Warfarin therapy and not concerning for liver failure)  -Continue home meds now and after discharge -f/u with PCP/cardiology after discharge   Dispo: Anticipated discharge today or tomorrow  Dewayne Hatch, MD 02/23/2019, 5:16 AM Pager: [phone]

## 2019-02-23 NOTE — Procedures (Signed)
I was present at this session.  I have reviewed the session itself and made appropriate changes.  Hd via [facility]A avf to get vol, get on sched. ? Hepatic congestion. Access press ok.  Jeneen Rinks Hilding Quintanar 2/25/202012:47 PM

## 2019-02-23 NOTE — Progress Notes (Signed)
Internal Medicine Attending:   I saw and examined the patient. I reviewed the resident's note and I agree with the resident's findings and plan as documented in the resident's note.  Patient states that he feels much better today.  No more orthopnea or PND and shortness of breath has resolved.  Patient is initially admitted to hospital with difficulty sleeping secondary to orthopnea and PND and was found to be slightly fluid overloaded in the setting of a history of ESRD on hemodialysis and chronic systolic heart failure.  Patient is much improved after hemodialysis with 4 L removed.  His lungs are clear to auscultation today.  He will need an outpatient sleep study as I suspect that OSA is contributing to his symptoms.  Nephrology follow-up and recommendations appreciated.  We will continue with hemodialysis per nephrology.  Of note, patient does have increasing transaminases up to the 300s and 400s.  The etiology remains uncertain at this time.  I doubt that this is viral hepatitis and his alkaline phosphatase is normal which would make an obstructive etiology less likely.  However, given the continued worsening of his LFTs we will check for hepatitis B and C as well as obtain a right upper quadrant sonogram.  We will repeat a CMP in the morning.  I do suspect that some of this may be secondary to hepatic congestion from fluid overload but will need to rule out other etiologies as well.

## 2019-02-23 NOTE — Plan of Care (Signed)
  Problem: Activity: Goal: Risk for activity intolerance will decrease Outcome: Progressing   Problem: Safety: Goal: Ability to remain free from injury will improve Outcome: Progressing   

## 2019-02-23 NOTE — Progress Notes (Signed)
Subjective: Interval History: has complaints breathing not real good yet, not sleeping, does not feel good.  Objective: Vital signs in last 24 hours: Temp:  [97.7 F (36.5 C)-98.7 F (37.1 C)] 98.3 F (36.8 C) (02/25 0800) Pulse Rate:  [37-113] 91 (02/25 0800) Resp:  [16-22] 18 (02/25 0426) BP: (115-156)/(55-93) 124/84 (02/25 0800) SpO2:  [91 %-100 %] 91 % (02/25 0800) Weight:  [120 kg-126.2 kg] 120 kg (02/25 0426) Weight change: 0.962 kg  Intake/Output from previous day: 02/24 0701 - 02/25 0700 In: 720 [P.O.:720] Out: 3502 [Stool:2] Intake/Output this shift: Total I/O In: 716 [P.O.:716] Out: -   General appearance: alert, cooperative, moderately obese, pale and on O2, no distress Resp: diminished breath sounds bilaterally and rales bibasilar Cardio: irregularly irregular rhythm and systolic murmur: holosystolic 2/6, blowing at apex GI: obese, pos bs, soft,  Extremities: bilat LE amp  Lab Results: Recent Labs    02/21/19 1130 02/22/19 0645  WBC 7.6 8.8  HGB 9.5* 9.3*  HCT 31.4* 29.2*  PLT 216 220   BMET:  Recent Labs    02/22/19 0645 02/23/19 0707  NA 135 137  K 3.8 3.7  CL 97* 97*  CO2 24 25  GLUCOSE 188* 200*  BUN 35* 30*  CREATININE 8.56* 6.98*  CALCIUM 10.5* 9.6   No results for input(s): PTH in the last 72 hours. Iron Studies: No results for input(s): IRON, TIBC, TRANSFERRIN, FERRITIN in the last 72 hours.  Studies/Results: Dg Chest Port 1 View  Result Date: 02/21/2019 CLINICAL DATA:  Shortness of breath.  Chest pressure.  On dialysis. EXAM: PORTABLE CHEST 1 VIEW COMPARISON:  01/28/2019 FINDINGS: Mildly degraded exam due to AP portable technique and patient body habitus. Midline trachea. Moderate cardiomegaly. Superior mediastinal soft tissue fullness is likely technique related. Cannot exclude small bilateral pleural effusions. No pneumothorax. Similar pulmonary interstitial prominence with thickening of the right minor fissure. IMPRESSION: Similar  pulmonary interstitial prominence. Likely a combination of low lung volumes/patient body habitus and mild pulmonary venous congestion. No overt congestive failure. Electronically Signed   By: Abigail Miyamoto M.D.   On: 02/21/2019 12:30    I have reviewed the patient's current medications.  Assessment/Plan: 1 ESRD will do additional HD with ^ LFTs, some breathing issues 2 Anemia esa 3 DM per primary 4 ^LFTs, ??viral, vs toxic not typical of congestion 5 OSA?? 6 Obesity 7 HPTH P HD,esa, lower vol, w/u of LFTs.    LOS: 1 day   Jeneen Rinks Kiko Ripp 02/23/2019,10:52 AM

## 2019-02-23 NOTE — Progress Notes (Signed)
Pt unable to tolerate CPAP. Pt switched back to nasal cannula per pt request. Will continue to monitor.

## 2019-02-24 ENCOUNTER — Ambulatory Visit (HOSPITAL_COMMUNITY): Payer: Medicare Other | Admitting: Physician Assistant

## 2019-02-24 ENCOUNTER — Inpatient Hospital Stay (HOSPITAL_COMMUNITY): Payer: Medicare Other

## 2019-02-24 DIAGNOSIS — Z885 Allergy status to narcotic agent status: Secondary | ICD-10-CM

## 2019-02-24 LAB — COMPREHENSIVE METABOLIC PANEL
ALT: 343 U/L — ABNORMAL HIGH (ref 0–44)
AST: 153 U/L — AB (ref 15–41)
Albumin: 3.4 g/dL — ABNORMAL LOW (ref 3.5–5.0)
Alkaline Phosphatase: 125 U/L (ref 38–126)
Anion gap: 13 (ref 5–15)
BUN: 33 mg/dL — AB (ref 6–20)
CO2: 26 mmol/L (ref 22–32)
Calcium: 9.4 mg/dL (ref 8.9–10.3)
Chloride: 97 mmol/L — ABNORMAL LOW (ref 98–111)
Creatinine, Ser: 6.48 mg/dL — ABNORMAL HIGH (ref 0.61–1.24)
GFR calc Af Amer: 10 mL/min — ABNORMAL LOW (ref >60)
GFR calc non Af Amer: 9 mL/min — ABNORMAL LOW (ref >60)
Glucose, Bld: 204 mg/dL — ABNORMAL HIGH (ref 70–99)
Potassium: 3.5 mmol/L (ref 3.5–5.1)
Sodium: 136 mmol/L (ref 135–145)
Total Bilirubin: 1.2 mg/dL (ref 0.3–1.2)
Total Protein: 6.9 g/dL (ref 6.5–8.1)

## 2019-02-24 LAB — GLUCOSE, CAPILLARY: Glucose-Capillary: 157 mg/dL — ABNORMAL HIGH (ref 70–99)

## 2019-02-24 LAB — CBC
HCT: 32.2 % — ABNORMAL LOW (ref 39.0–52.0)
Hemoglobin: 10.3 g/dL — ABNORMAL LOW (ref 13.0–17.0)
MCH: 32.3 pg (ref 26.0–34.0)
MCHC: 32 g/dL (ref 30.0–36.0)
MCV: 100.9 fL — ABNORMAL HIGH (ref 80.0–100.0)
Platelets: 172 10*3/uL (ref 150–400)
RBC: 3.19 MIL/uL — ABNORMAL LOW (ref 4.22–5.81)
RDW: 17.9 % — ABNORMAL HIGH (ref 11.5–15.5)
WBC: 9.2 10*3/uL (ref 4.0–10.5)
nRBC: 0.8 % — ABNORMAL HIGH (ref 0.0–0.2)

## 2019-02-24 LAB — HEPATITIS B SURFACE ANTIBODY, QUANTITATIVE: Hep B S AB Quant (Post): 26.2 m[IU]/mL (ref >9.9)

## 2019-02-24 LAB — PROTIME-INR
INR: 2 — ABNORMAL HIGH (ref 0.8–1.2)
Prothrombin Time: 22.5 seconds — ABNORMAL HIGH (ref 11.4–15.2)

## 2019-02-24 LAB — HEPATIC FUNCTION PANEL
ALT: 362 U/L — ABNORMAL HIGH (ref 0–44)
AST: 166 U/L — ABNORMAL HIGH (ref 15–41)
Albumin: 3.5 g/dL (ref 3.5–5.0)
Alkaline Phosphatase: 127 U/L — ABNORMAL HIGH (ref 38–126)
Bilirubin, Direct: 0.5 mg/dL — ABNORMAL HIGH (ref 0.0–0.2)
Indirect Bilirubin: 1 mg/dL — ABNORMAL HIGH (ref 0.3–0.9)
TOTAL PROTEIN: 7.2 g/dL (ref 6.5–8.1)
Total Bilirubin: 1.5 mg/dL — ABNORMAL HIGH (ref 0.3–1.2)

## 2019-02-24 LAB — HEPATITIS B CORE ANTIBODY, IGM: Hep B C IgM: NEGATIVE

## 2019-02-24 LAB — HEPATITIS C ANTIBODY: HCV Ab: <0.1 s/co ratio (ref 0.0–0.9)

## 2019-02-24 LAB — PHOSPHORUS: PHOSPHORUS: 2.4 mg/dL — AB (ref 2.5–4.6)

## 2019-02-24 MED ORDER — CHLORHEXIDINE GLUCONATE CLOTH 2 % EX PADS: 6.0000 | MEDICATED_PAD | Freq: Every day | CUTANEOUS | Status: DC

## 2019-02-24 MED ORDER — GUAIFENESIN-DM 100-10 MG/5ML PO SYRP
5.0000 mL | ORAL_SOLUTION | ORAL | Status: DC | PRN
  Administered 2019-02-24: 5 mL via ORAL
  Filled 2019-02-24: qty 5

## 2019-02-24 MED ORDER — WARFARIN SODIUM 7.5 MG PO TABS: 7.5000 mg | ORAL_TABLET | Freq: Once | ORAL | Status: DC

## 2019-02-24 MED ORDER — METOPROLOL SUCCINATE ER 50 MG PO TB24: 50.0000 mg | ORAL_TABLET | Freq: Every day | ORAL | Status: DC

## 2019-02-24 NOTE — Progress Notes (Signed)
Subjective: Interval History: has complaints still not sleeping.  Objective: Vital signs in last 24 hours: Temp:  [97.8 F (36.6 C)-98.3 F (36.8 C)] 98.1 F (36.7 C) (02/26 0430) Pulse Rate:  [90-128] 90 (02/26 0700) Resp:  [18-20] 20 (02/26 0430) BP: (99-140)/(55-116) 129/93 (02/26 0540) SpO2:  [91 %-100 %] 100 % (02/26 0430) Weight:  [117.1 kg-121.8 kg] 121.8 kg (02/26 0427) Weight change: -6.3 kg  Intake/Output from previous day: 02/25 0701 - 02/26 0700 In: 1556 [P.O.:1556] Out: 2100  Intake/Output this shift: No intake/output data recorded.  General appearance: alert, cooperative, no distress and moderately obese Resp: diminished breath sounds bilaterally Cardio: irregularly irregular rhythm and systolic murmur: systolic ejection 2/6, crescendo and decrescendo at 2nd left intercostal space GI: obese, pos bs, liver down 5 cm Extremities: Bilat BKA, AVF R FA  Lab Results: Recent Labs    02/23/19 1357 02/24/19 0628  WBC 8.1 9.2  HGB 9.9* 10.3*  HCT 32.0* 32.2*  PLT 188 172   BMET:  Recent Labs    02/23/19 0707 02/23/19 1643  NA 137 136  K 3.7 3.3*  CL 97* 97*  CO2 25 27  GLUCOSE 200* 167*  BUN 30* 16  CREATININE 6.98* 4.35*  CALCIUM 9.6 9.3   No results for input(s): PTH in the last 72 hours. Iron Studies: No results for input(s): IRON, TIBC, TRANSFERRIN, FERRITIN in the last 72 hours.  Studies/Results: No results found.  I have reviewed the patient's current medications.  Assessment/Plan: 1 ESRD vol better 2 HTN better, low at times , lower Metop, if has Rudean Hitt, ^ AMio 3 Anemia improved 4 HPTH vit D 5 PVD 6 Sleep disorder primary issue still 7 DM controlled 8 Afib rate controlled and anticoag , may need more Amio 9 Obesity P HD tomorrow, esa, lower metop,     LOS: 2 days   Jeneen Rinks Scharlene Catalina 02/24/2019,7:32 AM

## 2019-02-24 NOTE — Care Management Note (Signed)
Case Management Note  Patient Details  Name: Thomas [name] MRN: [id number] Date of Birth: [birth year]  Subjective/Objective: CHF                  Action/Plan: Patient lives at home; PCP is Dr [name] MD; has private insrance with Medicare / [employer] with prescription drug coverage; Noted Sleep Study scheduled for 03/12/2019 and CPAP machine was ordered on 02/08/2019; CM following for progression of care.    Expected Discharge Date:  02/24/19               Expected Discharge Plan:  Home/Self Care  Discharge planning Services  CM Consult  Status of Service:  In process, will continue to follow  Sherrilyn Rist [phone] 02/24/2019, 2:01 PM

## 2019-02-24 NOTE — Progress Notes (Signed)
Internal Medicine Attending:   I saw and examined the patient. I reviewed the resident's note and I agree with the resident's findings and plan as documented in the resident's note.  Patient feels well today with no new complaints.  He still states that he feels sleepy during the day and has trouble sleeping at night.  Patient is initially admitted to the hospital with mild fluid overload in the setting of ESRD on hemodialysis.  Patient symptoms are now significantly improved after consecutive hemodialysis sessions.  I suspect that a large component of his sleep issues are related to OSA.  Patient has a scheduled appointment in the sleep center in March.  Patient stable for DC home today.  No further work-up at this time.  Of note, patient also had worsening LFTs while in the hospital possibly secondary to congestion from fluid overload versus hypotension induced liver injury.  LFTs are much improved today.  Patient follow-up in [facility].  He will need repeat LFTs done at that time.  Work-up including a right upper quadrant sono and viral hepatitis panel were negative.

## 2019-02-24 NOTE — Progress Notes (Signed)
ANTICOAGULATION CONSULT NOTE  Pharmacy Consult:  Coumadin Indication: atrial fibrillation   Vital Signs: Temp: 98.1 F (36.7 C) (02/26 0430) Temp Source: Oral (02/26 0430) BP: 129/93 (02/26 0540) Pulse Rate: 90 (02/26 0700)  Labs: Recent Labs    02/22/19 0532 02/22/19 0645 02/23/19 0438 02/23/19 0707 02/23/19 1357 02/23/19 1643 02/24/19 0456 02/24/19 0628  HGB  --  9.3*  --   --  9.9*  --   --  10.3*  HCT  --  29.2*  --   --  32.0*  --   --  32.2*  PLT  --  220  --   --  188  --   --  172  LABPROT 42.3*  --  30.2*  --   --   --  22.5*  --   INR 4.54*  --  2.9*  --   --   --  2.0*  --   CREATININE  --  8.56*  --  6.98*  --  4.35*  --   --     Assessment: 27 YOM with history of AFib on Coumadin PTA and continued here per Pharmacy. INR was supratherapeutic on admission and has trended down to low therapeutic level today.  No bleeding reported.  Home dose: 3.75mg  PO daily except 7.5mg  on Mon/Wed/Sat  Goal of Therapy:  INR 2 - 3 Monitor platelets by anticoagulation protocol: Yes   Plan:  Coumadin 7.5mg  PO today Daily PT / INR   Yukiko Minnich D. Mina Marble, PharmD, BCPS, [place] 02/24/2019, 9:16 AM

## 2019-02-24 NOTE — Progress Notes (Signed)
Subjective: Thomas [name] was doing well today, no acute events overnight. He reports that he slept well overnight, denied any new issues today. We discussed the results of the hepatic tests and that he will need to have the sleep study. He reported that he already has one scheduled. We discussed that he may be able to go home after the results of the ultrasound come back.   Objective:  Vital signs in last 24 hours: Vitals:   02/23/19 2007 02/24/19 0427 02/24/19 0430 02/24/19 0540  BP: 118/84  (!) 140/116 (!) 129/93  Pulse: 98  (!) 104 (!) 120  Resp: 20  20   Temp: 98.1 F (36.7 C)  98.1 F (36.7 C)   TempSrc: Oral  Oral   SpO2: 100%  100%   Weight:  121.8 kg    Height:       Physical Exam  Constitutional: Appears well-developed and well-nourished. No distress.  HENT:  Head: Normocephalic and atraumatic.  Eyes: Conjunctivae are normal.  Cardiovascular: Normal rate, regular rhythm and normal heart sounds.  Respiratory: Effort normal and breath sounds normal. No respiratory distress. No wheezes.  No crackles on exam today. GI: Soft. Bowel sounds are normal. No distension. There is no tenderness.  Musculoskeletal: Bilateral BKA Neurological: Is alert and at x3 Skin: Not diaphoretic. No erythema.  Psychiatric: Normal mood and affect. Behavior is normal. Judgment and thought content normal.   Assessment/Plan:  Active Problems:   Insomnia   ESRD (end stage renal disease) on dialysis ([facility]) Insomnia/PND Likely due to probable OSA and fluid overload in setting of ESRD (on HD) and HFrEF. Significantly improved.  He mentions that although he cannot take the full sleep during the night, he feels much better than the time he came into the hospital. We discussed with patient that we will discharge him today to continue his dialysis sessions outpatient and should follow-up with PCP and may take benefit of CPAP. -Discharge home today -Continue out patient HD schedule -F/u with PCP for  sleep study (has a schedule on March for sleep study)   A fib:2 episode of tachy cardia at  104 and 120 early this morning. But came down since then and remained normal. Patient is asymptomatic.    -Continue home dose of Amiodarone, Metoprolol and warfarin after discharge -follow up in clinic as before for INR check.  CAD: Stable. Patient without acute symptoms before and during admission.  HFrEF (EF 35-40%)  Cardiac Cath 01/25/2019 Prox LAD lesion 30% stenosed,Mid LAD lesion 80% stenosed,Dist LAD lesion 99% stenosed,Ost 1st Diag lesion 100% stenosed,Ost 3rd Diag lesion is 95%, stenosed. On Metoprolol succinate 100 mg QD, Crestor 20 mg QD,(Ranolzain discontinued recently)at home. He also has worsening of trans aminitis today (AST 215, ALT 194 and elevated total Bili that is likely due to congestion. We will repeat daily. (No icter, No abdominal tenderness, elevated INR is due to Warfarin therapy and not concerning for liver failure)  -Continuehome meds now and after discharge -f/u with PCP/cardiology after discharge   Transaminitis:  Liver function test improved today. AST 166 , ALT 362, total bilirubin dropped to 1.5 today from 2 yesterday. HBS antigen negative. (No acute hepatitis B).  Rest of hepatitis panel are pending.  His transient transaminitis was likely secondary to congestion in setting of volume overload.  Recommended to follow-up with PCP in a week to repeat CMP and follow-up wrist hepatitis panel.  -RUQ US--> no acute abnormality -Follow-up with PCP in a week to repeat CMP  and follow-up hepatitis panel  Dispo: discharge home today  Dewayne Hatch, MD 02/24/2019, 5:45 AM Pager: [phone]

## 2019-02-24 NOTE — Discharge Summary (Deleted)
Name: Thomas [name] MRN: [id number] DOB: 07-Aug-1966 53 y.o. PCP: [name], MD  Date of Admission: 02/21/2019 11:10 AM Date of Discharge: 2/26/20202/26/2020 Attending Physician: No att. providers found  Discharge Diagnosis: 1.Active Problems:   Insomnia   ESRD (end stage renal disease) on dialysis [facility])    Discharge Medications: Allergies as of 02/24/2019      Reactions   Oxycodone-acetaminophen Other (See Comments)   Hallucinations/delirum      Medication List    TAKE these medications   acetaminophen 325 MG tablet Commonly known as:  TYLENOL Take 650 mg by mouth every 6 (six) hours as needed (for headaches).   amiodarone 200 MG tablet Commonly known as:  PACERONE Take 1 tablet (200 mg total) by mouth daily. What changed:  when to take this   aspirin 81 MG chewable tablet Chew 1 tablet (81 mg total) by mouth daily.   AURYXIA 1 GM 210 MG(Fe) tablet Generic drug:  ferric citrate Take 420-840 mg by mouth See admin instructions. Take 840 mg by mouth three times a day after meals and 420 mg after each snack   BIKTARVY 50-200-25 MG Tabs tablet Generic drug:  bictegravir-emtricitabine-tenofovir AF TAKE 1 TABLET BY MOUTH DAILY.   camphor-menthol lotion Commonly known as:  SARNA Apply topically as needed for itching. What changed:    how much to take  when to take this   cinacalcet 90 MG tablet Commonly known as:  SENSIPAR Take 90 mg by mouth daily.   eucerin cream Apply topically as needed for dry skin.   ferric gluconate 125 mg in sodium chloride 0.9 % 100 mL Inject 125 mg into the vein Every Tuesday,Thursday,and Saturday with dialysis.   gabapentin 100 MG capsule Commonly known as:  NEURONTIN Take 1 capsule (100 mg total) by mouth every dialysis.   insulin glargine 100 unit/mL Sopn Commonly known as:  LANTUS Inject 0.3 mLs (30 Units total) into the skin at bedtime. What changed:  how much to take   insulin lispro 100 UNIT/ML KiwkPen Commonly  known as:  HUMALOG Please inject 10 units, twice daily, ~10 min before your two largest meals of the day. What changed:    how much to take  how to take this  when to take this  additional instructions   linagliptin 5 MG Tabs tablet Commonly known as:  TRADJENTA Take 1 tablet (5 mg total) by mouth daily.   metoprolol succinate 100 MG 24 hr tablet Commonly known as:  TOPROL-XL TAKE 1 TABLET BY MOUTH ONCE DAILY TAKE WITH OR IMMEDIATELY FOLLOWING A MEAL What changed:    how much to take  how to take this  when to take this  additional instructions   nitroGLYCERIN 0.4 MG SL tablet Commonly known as:  NITROSTAT Place 1 tablet (0.4 mg total) under the tongue every 5 (five) minutes as needed for chest pain.   omega-3 acid ethyl esters 1 g capsule Commonly known as:  LOVAZA Take 2 capsules (2 g total) by mouth 2 (two) times daily.   pantoprazole 40 MG tablet Commonly known as:  PROTONIX Take 2 tablets (80 mg total) by mouth daily. What changed:    how much to take  when to take this   polycarbophil 625 MG tablet Commonly known as:  FIBERCON Take 1 tablet (625 mg total) by mouth daily.   ranolazine 500 MG 12 hr tablet Commonly known as:  RANEXA Take 1 tablet (500 mg total) by mouth 2 (two) times daily.  rosuvastatin 20 MG tablet Commonly known as:  CRESTOR Take 0.5 tablets (10 mg total) by mouth daily.   warfarin 7.5 MG tablet Commonly known as:  COUMADIN Take as directed. If you are unsure how to take this medication, talk to your nurse or doctor. Original instructions:  TAKE 1/2 TABLET BY MOUTH DAILY, EXCEPT 1 TABLET ON Monday, Wednesday, AND Saturday OR AS DIRECTED BY ANTICOAGULATION CLINIC. What changed:    how much to take  how to take this  when to take this  additional instructions       Disposition and follow-up:   [name] was discharged from [facility] in stable condition.  At the hospital follow up visit please  address:  1. Patient presented with insomnia and PND, likely in setting of volume overload /OSA. Please evaluate patient's volume status and f/u sleep study.  2. Patient developed transaminitis at this hospitalization. Improved but still elevalted at discharge. Please repeat CMP and f/u hepatitis panel result.  2.  Labs / imaging needed at time of follow-up: CMP, referral for sleep study, evaluation for volume status, INR  3.  Pending labs/ test needing follow-up: Hep panel  Follow-up Appointments: Follow-up Information    [name], MD On 03/05/2019.   Specialty:  Internal Medicine Why:  @ 10:30 am  Contact information: [place] [address] [phone]           Hospital Course by problem list: 47. 53 year old male past medical history of ESRD on hemodialysis (TTS), DM 2, HTN, paroxysmal A. Fib on Warfarin, CAD, HFrEF, presented to ED due to difficulty sleeping. Per his wife, he wakes up with difficulty breathing after 20 minutes of sleep at night and gets anxious. He has been tired and has low energy during the day. He was supposed to do a sleep study out patient but the appointment in not due yet. He is a hemodialysis patient and has been compliant with his HD (last session was the day before admission). He had volume overload when admitted. Nephrology was on board, patient got extra HD with extra fluid removal in hospital and his symptoms improved. He is discharged to continue his HD outpatient.    2. Transaminitis: Patient developed transaminitis during this hospitalization. Work up uncluding acute hepatitis B was negative. Rest of hepatitis panel are pending. RUQ ultrasound did not show any acute liver abnormality Liver function test improved at time of discharge: AST 166 , ALT 362, total bilirubin dropped to 1.5. (INR elevated due to Warfarin) His transaminitis was likely secondary to congestion in setting of volume overload.  Patient is discharged  to  follow-up with PCP in a week to repeat CMP and follow hepatitis panel.  3. Afib. Stable during this hospitalization. Patient is on Amiodarone, Metoprolol and Warfarin. Will continue at discharge.  Discharge Vitals:   BP (!) 140/106 (BP Location: Right Arm)   Pulse 72   Temp 98.7 F (37.1 C) (Oral)   Resp 18   Ht 6\' 5"  (1.956 m)   Wt 121.8 kg   SpO2 99%   BMI 31.84 kg/m   Pertinent Labs, Studies, and Procedures:  CMP Latest Ref Rng & Units 02/24/2019 02/24/2019 02/23/2019  Glucose 70 - 99 mg/dL 204(H) - 167(H)  BUN 6 - 20 mg/dL 33(H) - 16  Creatinine 0.61 - 1.24 mg/dL 6.48(H) - 4.35(H)  Sodium 135 - 145 mmol/L 136 - 136  Potassium 3.5 - 5.1 mmol/L 3.5 - 3.3(L)  Chloride 98 -  111 mmol/L 97(L) - 97(L)  CO2 22 - 32 mmol/L 26 - 27  Calcium 8.9 - 10.3 mg/dL 9.4 - 9.3  Total Protein 6.5 - 8.1 g/dL 6.9 7.2 -  Total Bilirubin 0.3 - 1.2 mg/dL 1.2 1.5(H) -  Alkaline Phos 38 - 126 U/L 125 127(H) -  AST 15 - 41 U/L 153(H) 166(H) -  ALT 0 - 44 U/L 343(H) 362(H) -     Discharge Instructions: Discharge Instructions    Call MD for:   Complete by:  As directed    Shortness of breath   Diet - low sodium heart healthy   Complete by:  As directed    Discharge instructions   Complete by:  As directed    Thank you for allowing [place] taking care of you at [facility] came in due to difficulty sleeping and shortness of breath at night.  We found some extra volume in your body removed by extrema dialysis.   As we also talked, part of your symptoms can be due to sleep apnea . We are glad that you are doing better.  Please follow your hemodialysis schedule and make sure to follow-up with your primary care in a week to repeat liver test and also getting sleep study. Your liver test were initially elevated but got better. Your liver ultrasound was ok.  If any question, call us at [phone].   Increase activity slowly   Complete by:  As directed       Signed: Dewayne Hatch,  MD 03/02/2019, 7:14 PM   Pager: [phone]

## 2019-02-24 NOTE — Progress Notes (Signed)
Pt asking about going home, discussed he is still using oxygen and he does not use at home, he reports he needs the oxygen esp at night, per report he was 100% on room air yesterday with walking,  2l/[place] with sat 99%, turned to 1l/[place] and will evaluate ptpt says he wants to talk to MD, paged MD as pt reqeust

## 2019-02-24 NOTE — Discharge Instructions (Signed)

## 2019-02-25 LAB — GLUCOSE, CAPILLARY: Glucose-Capillary: 276 mg/dL — ABNORMAL HIGH (ref 70–99)

## 2019-02-26 LAB — PROTIME-INR: INR: 2.3 — AB (ref 0.9–1.1)

## 2019-03-02 ENCOUNTER — Ambulatory Visit (HOSPITAL_BASED_OUTPATIENT_CLINIC_OR_DEPARTMENT_OTHER): Payer: Medicare Other | Attending: Internal Medicine | Admitting: Internal Medicine

## 2019-03-02 VITALS — Ht 77.0 in | Wt 275.0 lb

## 2019-03-02 DIAGNOSIS — G4700 Insomnia, unspecified: Secondary | ICD-10-CM

## 2019-03-02 DIAGNOSIS — G4733 Obstructive sleep apnea (adult) (pediatric): Secondary | ICD-10-CM | POA: Insufficient documentation

## 2019-03-02 NOTE — Progress Notes (Signed)
Cardiology Office Note    Date:  03/03/2019   ID:  Thomas [name], DOB [birth year], MRN [id number]  PCP:  Thomas Homes, MD  Cardiologist: Thomas Him, MD EPS: None  Chief Complaint  Patient presents with  . Hospitalization Follow-up    History of Present Illness:  Thomas [name] is a 53 y.o. 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    Was admitted 12/2018 with sudden onset of atrial flutter and chest pain.  Cardiac cath was unchanged from prior study but with severe two-vessel CAD 80% mid LAD, 99% distal LAD and diagonal and occluded D1/PL OM, 95% PLA, 90% RV marginal.  The only lesion amenable to PCI to mid LAD which would be complex with atherectomy and stent would require DAPT and Coumadin increasing his bleeding risk.  Medical therapy recommended.  Patient converted back to normal sinus rhythm spontaneously.  On beta-blocker amiodarone and warfarin.  Repeat echo showed LVEF 35 to 40% with restrictive physiology.  Because of hypotension on dialysis in the past ARB was not added.  Also followed in the lipid clinic for hyperlipidemia triglyceride elevation secondary to poorly controlled diabetes with hemoglobin A1c of 11.9.  He was started on Lovaza 2 g twice daily and continued on rosuvastatin 10 mg daily  Patient was discharged 02/24/2019 after an admission with fluid overload and insomnia.  He was dialyzed and symptoms improved.   He was in A. fib 02/21/2019.He had a sleep study last night and is very fatigued.  He denies chest pain or shortness of breath.  Complains of nonhealing wound on his right middle finger that he is picking.  He had x-rays done that did not show any bony abnormality but did have vascular calcifications.  Past Medical History:  Diagnosis Date  . Anemia   . Ascending aortic aneurysm ([place])   . CAD (coronary artery disease), native coronary artery 01/22/2019  . Chronic combined  systolic and diastolic CHF (congestive heart failure) ([place])   . Diabetic foot ulcer ([place]) 09/03/2017  . Diabetic wet gangrene of the foot ([place]) 09/04/2017  . ESRD (end stage renal disease) on dialysis [facility])    [place] [address] [place], [place], [place] (10/15/2018)  . Headache    "monthly" (06/15/2018)  . HIV disease ([place])   . Hyperlipidemia LDL goal <70 01/22/2019  . Hyperparathyroidism, secondary ([place])   . Hypertension   . Hypertensive heart disease with end stage renal disease on dialysis ([place]) 03/13/2017  . Mallory-Weiss tear 2018  . PAF (paroxysmal atrial fibrillation) ([place])   . Paroxysmal atrial flutter ([place])   . Pneumonia X 1  . QT prolongation   . Subacute osteomyelitis, right ankle and foot ([place])   . SVT (supraventricular tachycardia) ([place])    ? afib or atrial flutter s/p TEE/DCCV with subsequent ablation due to reoccurrence in [place]  . Type 2 diabetes mellitus ([place])     Past Surgical History:  Procedure Laterality Date  . AMPUTATION Left    foot  . AMPUTATION Right 12/06/2017   Procedure: AMPUTATION BELOW KNEE;  Surgeon: [name], MD;  Location: [place];  Service: Orthopedics;  Laterality: Right;  . APPLICATION OF WOUND VAC Right 09/04/2017   Procedure: APPLICATION OF WOUND VAC;  Surgeon: [name], DPM;  Location: [place];  Service: Podiatry;  Laterality: Right;  . APPLICATION OF WOUND VAC  12/06/2017   Procedure: APPLICATION OF WOUND VAC;  Surgeon: [name],  MD;  Location: [place];  Service: Orthopedics;;  . AV FISTULA PLACEMENT Right 10/19/2012  . AV FISTULA REPAIR Right ~ 02/2018   "had it cleaned out"  . BELOW KNEE LEG AMPUTATION Left ~ 2016  . COLONOSCOPY WITH PROPOFOL N/A 07/30/2017   Procedure: COLONOSCOPY WITH PROPOFOL;  Surgeon: [name], MD;  Location: [name];  Service: Endoscopy;  Laterality: N/A;  . ESOPHAGOGASTRODUODENOSCOPY (EGD) WITH PROPOFOL N/A 07/28/2017   Procedure: ESOPHAGOGASTRODUODENOSCOPY (EGD) WITH PROPOFOL;  Surgeon: [name], MD;   Location: [place];  Service: Endoscopy;  Laterality: N/A;  . ESOPHAGOGASTRODUODENOSCOPY (EGD) WITH PROPOFOL N/A 01/15/2018   Procedure: ESOPHAGOGASTRODUODENOSCOPY (EGD) WITH PROPOFOL;  Surgeon: [name], MD;  Location: [facility] ENDOSCOPY;  Service: Endoscopy;  Laterality: N/A;  . FLEXIBLE SIGMOIDOSCOPY N/A 07/27/2017   Procedure: FLEXIBLE SIGMOIDOSCOPY;  Surgeon: [name], MD;  Location: [facility] ENDOSCOPY;  Service: Endoscopy;  Laterality: N/A;  . GRAFT APPLICATION Right 63/87/5643   Procedure: SKIN GRAFT APPLICATION RIGHT FOOT;  Surgeon: [name], DPM;  Location: [place];  Service: Podiatry;  Laterality: Right;  . I&D EXTREMITY Right 09/04/2017   Procedure: IRRIGATION AND DEBRIDEMENT EXTREMITY;  Surgeon: [name], DPM;  Location: [place];  Service: Podiatry;  Laterality: Right;  . I&D EXTREMITY Right 11/12/2017   Procedure: IRRIGATION AND DEBRIDEMENT ULCER RIGHT FOOT;  Surgeon: [name], DPM;  Location: [place];  Service: Podiatry;  Laterality: Right;  . LEFT HEART CATH AND CORONARY ANGIOGRAPHY N/A 06/18/2018   Procedure: LEFT HEART CATH AND CORONARY ANGIOGRAPHY;  Surgeon: [name], Peter M, MD;  Location: [place] CV [facility];  Service: Cardiovascular;  Laterality: N/A;  . LEFT HEART CATH AND CORONARY ANGIOGRAPHY N/A 01/25/2019   Procedure: LEFT HEART CATH AND CORONARY ANGIOGRAPHY;  Surgeon: [name], Peter M, MD;  Location: [place] CV [facility];  Service: Cardiovascular;  Laterality: N/A;  . WOUND DEBRIDEMENT N/A 09/24/2017   Procedure: DEBRIDEMENT WOUND;  Surgeon: [name], DPM;  Location: [place];  Service: Podiatry;  Laterality: N/A;    Current Medications: Current Meds  Medication Sig  . acetaminophen (TYLENOL) 325 MG tablet Take 650 mg by mouth every 6 (six) hours as needed (for headaches).   Marland Kitchen amiodarone (PACERONE) 200 MG tablet Take 1 tablet (200 mg total) by mouth daily.  Marland Kitchen aspirin 81 MG chewable tablet Chew 1 tablet (81 mg total) by mouth daily.  Marland Kitchen BIKTARVY  50-200-25 MG TABS tablet TAKE 1 TABLET BY MOUTH DAILY.  . cinacalcet (SENSIPAR) 90 MG tablet Take 90 mg by mouth daily.  . ferric citrate (AURYXIA) 1 GM 210 MG(Fe) tablet Take 420-840 mg by mouth See admin instructions. Take 840 mg by mouth three times a day after meals and 420 mg after each snack  . ferric gluconate 125 mg in sodium chloride 0.9 % 100 mL Inject 125 mg into the vein Every Tuesday,Thursday,and Saturday with dialysis.  Marland Kitchen gabapentin (NEURONTIN) 100 MG capsule Take 1 capsule (100 mg total) by mouth every dialysis.  Marland Kitchen insulin glargine (LANTUS) 100 unit/mL SOPN Inject 0.3 mLs (30 Units total) into the skin at bedtime.  . insulin lispro (HUMALOG) 100 UNIT/ML KiwkPen Please inject 10 units, twice daily, ~10 min before your two largest meals of the day.  . linagliptin (TRADJENTA) 5 MG TABS tablet Take 1 tablet (5 mg total) by mouth daily.  . metoprolol succinate (TOPROL-XL) 100 MG 24 hr tablet TAKE 1 TABLET BY MOUTH ONCE DAILY TAKE WITH OR IMMEDIATELY FOLLOWING A MEAL  . nitroGLYCERIN (NITROSTAT) 0.4  MG SL tablet Place 1 tablet (0.4 mg total) under the tongue every 5 (five) minutes as needed for chest pain.  Marland Kitchen omega-3 acid ethyl esters (LOVAZA) 1 g capsule Take 2 capsules (2 g total) by mouth 2 (two) times daily.  . pantoprazole (PROTONIX) 40 MG tablet Take 2 tablets (80 mg total) by mouth daily.  . polycarbophil (FIBERCON) 625 MG tablet Take 1 tablet (625 mg total) by mouth daily.  . ranolazine (RANEXA) 500 MG 12 hr tablet Take 1 tablet (500 mg total) by mouth 2 (two) times daily.  . rosuvastatin (CRESTOR) 20 MG tablet Take 0.5 tablets (10 mg total) by mouth daily.  . Skin Protectants, Misc. (EUCERIN) cream Apply topically as needed for dry skin.  Marland Kitchen warfarin (COUMADIN) 7.5 MG tablet TAKE 1/2 TABLET BY MOUTH DAILY, EXCEPT 1 TABLET ON Monday, Wednesday, AND Saturday OR AS DIRECTED BY ANTICOAGULATION CLINIC. (Patient taking differently: Take 3.75-7.5 mg by mouth at bedtime. Take 3.75 mg by  mouth at bedtime on Sun/Tues/Thurs/Fri and 7.5 mg on Mon/Wed/Sat)     Allergies:   Oxycodone-acetaminophen   Social History   Socioeconomic History  . Marital status: Married    Spouse name: kim  . Number of children: 3  . Years of education: college  . Highest education level: Not on file  Occupational History  . Occupation: disabled  Social Needs  . Financial resource strain: Not on file  . Food insecurity:    Worry: Not on file    Inability: Not on file  . Transportation needs:    Medical: Not on file    Non-medical: Not on file  Tobacco Use  . Smoking status: Never Smoker  . Smokeless tobacco: Never Used  Substance and Sexual Activity  . Alcohol use: Never    Frequency: Never  . Drug use: Never  . Sexual activity: Not Currently  Lifestyle  . Physical activity:    Days per week: Not on file    Minutes per session: Not on file  . Stress: Not on file  Relationships  . Social connections:    Talks on phone: Not on file    Gets together: Not on file    Attends religious service: Not on file    Active member of club or organization: Not on file    Attends meetings of clubs or organizations: Not on file    Relationship status: Not on file  Other Topics Concern  . Not on file  Social History Narrative   Lives in [place] with wife.     Family History:  The patient's family history includes Diabetes in his mother; Heart disease in his father and mother.   ROS:   Please see the history of present illness.    Review of Systems  Cardiovascular: Positive for irregular heartbeat.  Respiratory: Positive for sleep disturbances due to breathing and snoring.    All other systems reviewed and are negative.   PHYSICAL EXAM:   VS:  BP (!) 142/72   Pulse 72   Ht 6' 5.5" (1.969 m)   Wt 275 lb 9.6 oz (125 kg)   SpO2 (!) 89%   BMI 32.26 kg/m   Physical Exam  GEN: Well nourished, well developed, in no acute distress  Neck: no JVD, carotid bruits, or  masses Cardiac:RRR; 1/6 systolic murmur at left sternal border Respiratory:  clear to auscultation bilaterally, normal work of breathing GI: soft, nontender, nondistended, + BS Ext: Double amputee Neuro:  Alert and Oriented x 3  Psych: euthymic mood, full affect  Wt Readings from Last 3 Encounters:  03/03/19 275 lb 9.6 oz (125 kg)  03/02/19 275 lb (124.7 kg)  02/24/19 268 lb 8.3 oz (121.8 kg)      Studies/Labs Reviewed:   EKG:  EKG is ordered today.  The ekg ordered today demonstrates normal sinus rhythm with first-degree AV block PR 272  Recent Labs: 01/22/2019: TSH 1.480 02/08/2019: Magnesium 2.3 02/24/2019: ALT 343; BUN 33; Creatinine, Ser 6.48; Hemoglobin 10.3; Platelets 172; Potassium 3.5; Sodium 136   Lipid Panel    Component Value Date/Time   CHOL 89 01/23/2019 0314   TRIG 409 (H) 01/23/2019 0314   HDL 16 (L) 01/23/2019 0314   CHOLHDL 5.6 01/23/2019 0314   VLDL UNABLE TO CALCULATE IF TRIGLYCERIDE OVER 400 mg/dL 01/23/2019 0314   LDLCALC UNABLE TO CALCULATE IF TRIGLYCERIDE OVER 400 mg/dL 01/23/2019 0314    Additional studies/ records that were reviewed today include:   2D echo 1/26/2020Study Conclusions   - Left ventricle: The cavity size was mildly dilated. Wall   thickness was increased in a pattern of severe LVH. Systolic   function was moderately reduced. The estimated ejection fraction   was in the range of 35% to 40%. Diffuse hypokinesis. Doppler   parameters are consistent with restrictive physiology, indicative   of decreased left ventricular diastolic compliance and/or   increased left atrial pressure. Doppler parameters are consistent   with high ventricular filling pressure. - Aortic valve: There was mild regurgitation. - Aortic root: The aortic root was mildly dilated. - Ascending aorta: The ascending aorta was mildly dilated. - Mitral valve: Calcified annulus. There was mild regurgitation. - Left atrium: The atrium was severely dilated. - Pulmonary  arteries: Systolic pressure was mildly increased. PA   peak pressure: 42 mm Hg (S).   Impressions:   - Moderate global reduction in LV systolic function (EF 40); severe   LVH; restrictive filling; mild LVE; mild AI; mildly dilated   aortic root/ascending aorta; mild MR; severe LAE; mild TR with   mild pulmonary hypertension.    Cardiac catheterization 1/27/2020Prox LAD lesion is 30% stenosed.  Ost 3rd Diag lesion is 95% stenosed.  Mid LAD lesion is 80% stenosed.  Dist LAD lesion is 99% stenosed.  Ost 1st Diag lesion is 100% stenosed.  Acute Mrg lesion is 95% stenosed.  1st RPLB lesion is 100% stenosed.  Post Atrio lesion is 95% stenosed.  LV end diastolic pressure is mildly elevated.   1. Severe 2 vessel obstructive CAD    - 80% mid LAD    - 99% distal LAD and third diagonal    - 100% first diagonal    - 100% first PLOM, 95% ongoing PL branch    - 90% RV marginal branch. 2. Mildly elevated LVEDP 24 mm Hg.   Plan: Compared to June 2019 there is no significant change. The only lesion that is amenable to PCI is the mid LAD. This would be a complex PCI with need for atherectomy and stenting due to severe calcification.  It would also require DAPT as well as coumadin increasing bleeding risk. As noted in prior cath note I don't think treatment of this lesion would add to his overall prognosis. The patient has only had 2 episodes of angina since last June. One occurred last October when he became hypotensive during dialysis. His recent symptoms are clearly related to development of atrial flutter and  his symptoms resolved with restoration of NSR. This would also argue  for continued medical management.     ASSESSMENT:    1. Coronary artery disease involving native coronary artery of native heart with angina pectoris ([place])   2. PAF (paroxysmal atrial fibrillation) ([place])   3. Essential hypertension   4. Non-ischemic cardiomyopathy ([employer])   5. Chronic systolic CHF (congestive  heart failure) ([place])   6. Ascending aortic aneurysm ([place])   7. Hyperlipidemia LDL goal <70      PLAN:  In order of problems listed above: CAD with recent cath showing severe two-vessel CAD unchanged from prior cath.  Medical therapy recommended.  See above for details.  Patient denies angina.  Atrial flutter converted to normal sinus rhythm in the hospital on amiodarone, metoprolol and Coumadin but was back in atrial fibrillation during hospitalization 02/21/2019.  Patient back in normal sinus rhythm with prolonged PR.  Reviewed EKG with Dr. Irish Lack [name] because his rates in the 70s he recommended no change in amiodarone or Toprol.  Follow-up with Dr. [name] next available.  Essential hypertension blood pressure stable  Non-Ischemic cardiomyopathy ejection fraction 35 to 40% with restrictive physiology recent fluid overload managed with dialysis  Chronic systolic CHF patient developed hypotension on dialysis compensated  Asending aortic aneurysm 44 mmHg needs ongoing monitoring  Hyperlipidemia with severe hypertriglyceridemia cannot calculate LDL.  Crestor increased to 40 mg daily and on Lovaza 2 g twice daily in the lipid clinic  Medication Adjustments/Labs and Tests Ordered: Current medicines are reviewed at length with the patient today.  Concerns regarding medicines are outlined above.  Medication changes, Labs and Tests ordered today are listed in the Patient Instructions below. Patient Instructions  Medication Instructions:  Your physician recommends that you continue on your current medications as directed. Please refer to the Current Medication list given to you today.  If you need a refill on your cardiac medications before your next appointment, please call your pharmacy.   Lab work: None  If you have labs (blood work) drawn today and your tests are completely normal, you will receive your results only by: Marland Kitchen MyChart Message (if you have MyChart) OR . A paper copy in the  mail If you have any lab test that is abnormal or we need to change your treatment, we will call you to review the results.  Testing/Procedures: None  Follow-Up: Follow up with Dr. [name] at her next available appointment   Any Other Special Instructions Will Be Listed Below (If Applicable).      Sumner Boast, PA-C  03/03/2019 10:07 AM    [facility] [address], [place], [place]  [address] Phone: [phone]; Fax: [phone]

## 2019-03-02 NOTE — Discharge Summary (Signed)
Name: Thomas [name] MRN: [id number] DOB: 05/19/1966 53 y.o. PCP: [name], MD  Date of Admission: 02/21/2019 11:10 AM Date of Discharge: 2/26/20202/26/2020 Attending Physician: No att. providers found  Discharge Diagnosis: 1.Active Problems:   Insomnia   ESRD (end stage renal disease) on dialysis [facility])    Discharge Medications: Allergies as of 02/24/2019      Reactions   Oxycodone-acetaminophen Other (See Comments)   Hallucinations/delirum      Medication List    TAKE these medications   acetaminophen 325 MG tablet Commonly known as:  TYLENOL Take 650 mg by mouth every 6 (six) hours as needed (for headaches).   amiodarone 200 MG tablet Commonly known as:  PACERONE Take 1 tablet (200 mg total) by mouth daily. What changed:  when to take this   aspirin 81 MG chewable tablet Chew 1 tablet (81 mg total) by mouth daily.   AURYXIA 1 GM 210 MG(Fe) tablet Generic drug:  ferric citrate Take 420-840 mg by mouth See admin instructions. Take 840 mg by mouth three times a day after meals and 420 mg after each snack   BIKTARVY 50-200-25 MG Tabs tablet Generic drug:  bictegravir-emtricitabine-tenofovir AF TAKE 1 TABLET BY MOUTH DAILY.   camphor-menthol lotion Commonly known as:  SARNA Apply topically as needed for itching. What changed:    how much to take  when to take this   cinacalcet 90 MG tablet Commonly known as:  SENSIPAR Take 90 mg by mouth daily.   eucerin cream Apply topically as needed for dry skin.   ferric gluconate 125 mg in sodium chloride 0.9 % 100 mL Inject 125 mg into the vein Every Tuesday,Thursday,and Saturday with dialysis.   gabapentin 100 MG capsule Commonly known as:  NEURONTIN Take 1 capsule (100 mg total) by mouth every dialysis.   insulin glargine 100 unit/mL Sopn Commonly known as:  LANTUS Inject 0.3 mLs (30 Units total) into the skin at bedtime. What changed:  how much to take   insulin lispro 100 UNIT/ML KiwkPen Commonly  known as:  HUMALOG Please inject 10 units, twice daily, ~10 min before your two largest meals of the day. What changed:    how much to take  how to take this  when to take this  additional instructions   linagliptin 5 MG Tabs tablet Commonly known as:  TRADJENTA Take 1 tablet (5 mg total) by mouth daily.   metoprolol succinate 100 MG 24 hr tablet Commonly known as:  TOPROL-XL TAKE 1 TABLET BY MOUTH ONCE DAILY TAKE WITH OR IMMEDIATELY FOLLOWING A MEAL What changed:    how much to take  how to take this  when to take this  additional instructions   nitroGLYCERIN 0.4 MG SL tablet Commonly known as:  NITROSTAT Place 1 tablet (0.4 mg total) under the tongue every 5 (five) minutes as needed for chest pain.   omega-3 acid ethyl esters 1 g capsule Commonly known as:  LOVAZA Take 2 capsules (2 g total) by mouth 2 (two) times daily.   pantoprazole 40 MG tablet Commonly known as:  PROTONIX Take 2 tablets (80 mg total) by mouth daily. What changed:    how much to take  when to take this   polycarbophil 625 MG tablet Commonly known as:  FIBERCON Take 1 tablet (625 mg total) by mouth daily.   ranolazine 500 MG 12 hr tablet Commonly known as:  RANEXA Take 1 tablet (500 mg total) by mouth 2 (two) times daily.  rosuvastatin 20 MG tablet Commonly known as:  CRESTOR Take 0.5 tablets (10 mg total) by mouth daily.   warfarin 7.5 MG tablet Commonly known as:  COUMADIN Take as directed. If you are unsure how to take this medication, talk to your nurse or doctor. Original instructions:  TAKE 1/2 TABLET BY MOUTH DAILY, EXCEPT 1 TABLET ON Monday, Wednesday, AND Saturday OR AS DIRECTED BY ANTICOAGULATION CLINIC. What changed:    how much to take  how to take this  when to take this  additional instructions       Disposition and follow-up:   Thomas [name] was discharged from [facility] in stable condition.  At the hospital follow up visit please  address:  1. Patient presented with insomnia and PND, likely in setting of volume overload /OSA. Please evaluate patient's volume status and f/u sleep study.  2. Patient developed transaminitis at this hospitalization. Improved but still elevalted at discharge. Please repeat CMP and f/u hepatitis panel result.  2.  Labs / imaging needed at time of follow-up: CMP, referral for sleep study, evaluation for volume status, INR  3.  Pending labs/ test needing follow-up: Hep panel  Follow-up Appointments: Follow-up Information    [name], MD On 03/05/2019.   Specialty:  Internal Medicine Why:  @ 10:30 am  Contact information: [name] [phone]           Hospital Course by problem list: 86. 53 year old male past medical history of ESRD on hemodialysis (TTS), DM 2, HTN, paroxysmal A. Fib on Warfarin, CAD, HFrEF, presented to ED due to difficulty sleeping. Per his wife, he wakes up with difficulty breathing after 20 minutes of sleep at night and gets anxious. He has been tired and has low energy during the day. He was supposed to do a sleep study out patient but the appointment in not due yet. He is a hemodialysis patient and has been compliant with his HD (last session was the day before admission). He had volume overload when admitted. Nephrology was on board, patient got extra HD with extra fluid removal in hospital and his symptoms improved. He is discharged to continue his HD outpatient.    2. Transaminitis: Patient developed transaminitis during this hospitalization. Work up uncluding acute hepatitis B was negative. Rest of hepatitis panel are pending. RUQ ultrasound did not show any acute liver abnormality Liver function test improved at time of discharge: AST 166 , ALT 362, total bilirubin dropped to 1.5. (INR elevated due to Warfarin) His transaminitis was likely secondary to congestion in setting of volume overload.  Patient is discharged  to  follow-up with PCP in a week to repeat CMP and follow hepatitis panel.  3. Afib. Stable during this hospitalization. Patient is on Amiodarone, Metoprolol and Warfarin. Will continue at discharge.  Discharge Vitals:   BP (!) 140/106 (BP Location: Right Arm)   Pulse 72   Temp 98.7 F (37.1 C) (Oral)   Resp 18   Ht 6\' 5"  (1.956 m)   Wt 121.8 kg   SpO2 99%   BMI 31.84 kg/m   Pertinent Labs, Studies, and Procedures:  CMP Latest Ref Rng & Units 02/24/2019 02/24/2019 02/23/2019  Glucose 70 - 99 mg/dL 204(H) - 167(H)  BUN 6 - 20 mg/dL 33(H) - 16  Creatinine 0.61 - 1.24 mg/dL 6.48(H) - 4.35(H)  Sodium 135 - 145 mmol/L 136 - 136  Potassium 3.5 - 5.1 mmol/L 3.5 - 3.3(L)  Chloride 98 -  111 mmol/L 97(L) - 97(L)  CO2 22 - 32 mmol/L 26 - 27  Calcium 8.9 - 10.3 mg/dL 9.4 - 9.3  Total Protein 6.5 - 8.1 g/dL 6.9 7.2 -  Total Bilirubin 0.3 - 1.2 mg/dL 1.2 1.5(H) -  Alkaline Phos 38 - 126 U/L 125 127(H) -  AST 15 - 41 U/L 153(H) 166(H) -  ALT 0 - 44 U/L 343(H) 362(H) -     Discharge Instructions: Discharge Instructions    Call MD for:   Complete by:  As directed    Shortness of breath   Diet - low sodium heart healthy   Complete by:  As directed    Discharge instructions   Complete by:  As directed    Thank you for allowing [place] taking care of you at [place] came in due to difficulty sleeping and shortness of breath at night.  We found some extra volume in your body removed by extrema dialysis.   As we also talked, part of your symptoms can be due to sleep apnea . We are glad that you are doing better.  Please follow your hemodialysis schedule and make sure to follow-up with your primary care in a week to repeat liver test and also getting sleep study. Your liver test were initially elevated but got better. Your liver ultrasound was ok.  If any question, call us at [phone].   Increase activity slowly   Complete by:  As directed       Signed: Dewayne Hatch,  MD 03/02/2019, 9:08 PM   Pager: [phone]

## 2019-03-03 ENCOUNTER — Ambulatory Visit (INDEPENDENT_AMBULATORY_CARE_PROVIDER_SITE_OTHER): Payer: Medicare Other | Admitting: Physician Assistant

## 2019-03-03 ENCOUNTER — Encounter: Payer: Self-pay | Admitting: Physician Assistant

## 2019-03-03 ENCOUNTER — Ambulatory Visit (INDEPENDENT_AMBULATORY_CARE_PROVIDER_SITE_OTHER): Payer: Medicare Other | Admitting: *Deleted

## 2019-03-03 VITALS — BP 142/72 | HR 72 | Ht 77.5 in | Wt 275.6 lb

## 2019-03-03 DIAGNOSIS — E785 Hyperlipidemia, unspecified: Secondary | ICD-10-CM

## 2019-03-03 DIAGNOSIS — Z5181 Encounter for therapeutic drug level monitoring: Secondary | ICD-10-CM | POA: Diagnosis not present

## 2019-03-03 DIAGNOSIS — I7121 Aneurysm of the ascending aorta, without rupture: Secondary | ICD-10-CM

## 2019-03-03 DIAGNOSIS — I4891 Unspecified atrial fibrillation: Secondary | ICD-10-CM

## 2019-03-03 DIAGNOSIS — I25119 Atherosclerotic heart disease of native coronary artery with unspecified angina pectoris: Secondary | ICD-10-CM

## 2019-03-03 DIAGNOSIS — I480 Paroxysmal atrial fibrillation: Secondary | ICD-10-CM | POA: Diagnosis not present

## 2019-03-03 DIAGNOSIS — I10 Essential (primary) hypertension: Secondary | ICD-10-CM

## 2019-03-03 DIAGNOSIS — I712 Thoracic aortic aneurysm, without rupture: Secondary | ICD-10-CM

## 2019-03-03 DIAGNOSIS — I428 Other cardiomyopathies: Secondary | ICD-10-CM | POA: Diagnosis not present

## 2019-03-03 DIAGNOSIS — I5022 Chronic systolic (congestive) heart failure: Secondary | ICD-10-CM

## 2019-03-03 DIAGNOSIS — I200 Unstable angina: Secondary | ICD-10-CM

## 2019-03-03 LAB — POCT INR: INR: 4.9 — AB (ref 2.0–3.0)

## 2019-03-03 NOTE — Patient Instructions (Signed)
Description   Do not take any Coumadin today and No Coumadin tomorrow then start taking 1/2 tablet daily except 1 tablet on Mondays and Saturdays.  Recheck INR in 2 weeks.  Coumadin Clinic [phone] Main [phone]

## 2019-03-03 NOTE — Patient Instructions (Signed)
Medication Instructions:  Your physician recommends that you continue on your current medications as directed. Please refer to the Current Medication list given to you today.  If you need a refill on your cardiac medications before your next appointment, please call your pharmacy.   Lab work: None  If you have labs (blood work) drawn today and your tests are completely normal, you will receive your results only by: Marland Kitchen MyChart Message (if you have MyChart) OR . A paper copy in the mail If you have any lab test that is abnormal or we need to change your treatment, we will call you to review the results.  Testing/Procedures: None  Follow-Up: Follow up with Dr. [name] at her next available appointment   Any Other Special Instructions Will Be Listed Below (If Applicable).

## 2019-03-04 ENCOUNTER — Other Ambulatory Visit: Payer: Self-pay | Admitting: Internal Medicine

## 2019-03-04 ENCOUNTER — Telehealth: Payer: Self-pay

## 2019-03-04 DIAGNOSIS — B20 Human immunodeficiency virus [HIV] disease: Secondary | ICD-10-CM

## 2019-03-04 LAB — POCT INR: INR: 3.4 — AB (ref 2.0–3.0)

## 2019-03-04 NOTE — Telephone Encounter (Signed)
Attempted to call patient to schedule appointment for lab work/office visit. Patient was last seen by Pharmacist on 02/16/18. Left voicemail requesting patient call office back. [place]

## 2019-03-05 ENCOUNTER — Ambulatory Visit: Payer: Medicare Other | Admitting: Dietician

## 2019-03-05 ENCOUNTER — Ambulatory Visit (INDEPENDENT_AMBULATORY_CARE_PROVIDER_SITE_OTHER): Payer: Medicare Other | Admitting: Internal Medicine

## 2019-03-05 ENCOUNTER — Other Ambulatory Visit: Payer: Self-pay

## 2019-03-05 ENCOUNTER — Encounter: Payer: Self-pay | Admitting: Internal Medicine

## 2019-03-05 ENCOUNTER — Encounter: Payer: Self-pay | Admitting: Dietician

## 2019-03-05 VITALS — BP 131/81 | HR 63 | Temp 98.1°F | Ht 77.5 in | Wt 264.2 lb

## 2019-03-05 DIAGNOSIS — Z7901 Long term (current) use of anticoagulants: Secondary | ICD-10-CM

## 2019-03-05 DIAGNOSIS — R740 Nonspecific elevation of levels of transaminase and lactic acid dehydrogenase [LDH]: Secondary | ICD-10-CM | POA: Diagnosis not present

## 2019-03-05 DIAGNOSIS — Z21 Asymptomatic human immunodeficiency virus [HIV] infection status: Secondary | ICD-10-CM

## 2019-03-05 DIAGNOSIS — I480 Paroxysmal atrial fibrillation: Secondary | ICD-10-CM | POA: Diagnosis not present

## 2019-03-05 DIAGNOSIS — Z992 Dependence on renal dialysis: Secondary | ICD-10-CM

## 2019-03-05 DIAGNOSIS — Z7982 Long term (current) use of aspirin: Secondary | ICD-10-CM | POA: Diagnosis not present

## 2019-03-05 DIAGNOSIS — F329 Major depressive disorder, single episode, unspecified: Secondary | ICD-10-CM | POA: Diagnosis not present

## 2019-03-05 DIAGNOSIS — Z79899 Other long term (current) drug therapy: Secondary | ICD-10-CM

## 2019-03-05 DIAGNOSIS — Z794 Long term (current) use of insulin: Secondary | ICD-10-CM | POA: Diagnosis not present

## 2019-03-05 DIAGNOSIS — R441 Visual hallucinations: Secondary | ICD-10-CM

## 2019-03-05 DIAGNOSIS — E118 Type 2 diabetes mellitus with unspecified complications: Secondary | ICD-10-CM

## 2019-03-05 DIAGNOSIS — G4700 Insomnia, unspecified: Secondary | ICD-10-CM | POA: Diagnosis present

## 2019-03-05 DIAGNOSIS — E1122 Type 2 diabetes mellitus with diabetic chronic kidney disease: Secondary | ICD-10-CM | POA: Diagnosis not present

## 2019-03-05 DIAGNOSIS — R443 Hallucinations, unspecified: Secondary | ICD-10-CM

## 2019-03-05 DIAGNOSIS — N186 End stage renal disease: Secondary | ICD-10-CM | POA: Diagnosis not present

## 2019-03-05 DIAGNOSIS — R440 Auditory hallucinations: Secondary | ICD-10-CM | POA: Diagnosis not present

## 2019-03-05 DIAGNOSIS — R7401 Elevation of levels of liver transaminase levels: Secondary | ICD-10-CM | POA: Insufficient documentation

## 2019-03-05 MED ORDER — ACCU-CHEK FASTCLIX LANCETS MISC: 3 refills | Status: AC

## 2019-03-05 MED ORDER — GLUCOSE BLOOD VI STRP: ORAL_STRIP | 3 refills | Status: AC

## 2019-03-05 MED ORDER — INSULIN PEN NEEDLE 32G X 4 MM MISC: 3 refills | Status: AC

## 2019-03-05 NOTE — Progress Notes (Signed)
Met with patient and woman in the room with him about diabetes supplies and insulin. They were taught how to use the meter, lancing decvies and about insulin site rotation and proper injection techniques. Care was coordinated with Dr. Sharon Seller. A sample Accu chek guide meter was provided today with pen needle samples. Patient verbalized understanding and was encouraged to contact certified diabetes educator with questions or concerns. Debera Lat, RD 03/05/2019 12:10 PM.

## 2019-03-05 NOTE — Assessment & Plan Note (Signed)
Continues to be compliant with HD. His wife states he "never misses HD." They are unsure what led to his volume overload last admission. Possibly related to a. Flutter and a. fib. He has not had any increased SOB. 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 No JVD on exam and lung sounds are clear.   - obtain f/u CMP

## 2019-03-05 NOTE — Assessment & Plan Note (Signed)
He has been unable to afford his insulin since the [place] Year due to co-pays. Discussed with Dr. [name] and Butch Penny. Butch Penny met with him and supplied levemir and fiasp samples with education on how to use, but this is unlikely to be a long term solution.    - refer to follow-up with Dr. [name] for further options in affordability

## 2019-03-05 NOTE — Assessment & Plan Note (Signed)
While hospitalized, thought to be secondary to volume overload as LFTs only moderately elevated. Hepatitis panel ordered while hospitalized. HCV and HBV negative. No signs of encephalopathy or jaundice. No abdominal pain.   - repeat CMP today

## 2019-03-05 NOTE — Progress Notes (Signed)
   CC: hospital f/u, insomnia  HPI:  [name] is a 53 y.o. with PMH as below.   Please see A&P for assessment of the patient's acute and chronic medical conditions.   Past Medical History:  Diagnosis Date  . Anemia   . Ascending aortic aneurysm ([place])   . CAD (coronary artery disease), native coronary artery 01/22/2019  . Chronic combined systolic and diastolic CHF (congestive heart failure) ([place])   . Diabetic foot ulcer ([place]) 09/03/2017  . 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 ESRD (end stage renal disease) on dialysis [facility])    [place] [address] [place], [place], [place] (10/15/2018)  . Headache    "monthly" (06/15/2018)  . HIV disease ([place])   . Hyperlipidemia LDL goal <70 01/22/2019  . Hyperparathyroidism, secondary ([place])   . Hypertension   . Hypertensive heart disease with end stage renal disease on dialysis ([place]) 03/13/2017  . Mallory-Weiss tear 2018  . PAF (paroxysmal atrial fibrillation) ([place])   . Paroxysmal atrial flutter ([place])   . Pneumonia X 1  . QT prolongation   . Subacute osteomyelitis, right ankle and foot ([place])   . SVT (supraventricular tachycardia) ([place])    ? afib or atrial flutter s/p TEE/DCCV with subsequent ablation due to reoccurrence in [place]  . Type 2 diabetes mellitus ([place])    Review of Systems:   Review of Systems  Constitutional: Positive for malaise/fatigue. Negative for chills, fever and weight loss.  Respiratory: Negative for cough, sputum production, shortness of breath and wheezing.   Cardiovascular: Negative for chest pain, palpitations, orthopnea and leg swelling.  Gastrointestinal: Negative for abdominal pain, constipation, diarrhea, nausea and vomiting.  Neurological: Negative for dizziness, tingling, focal weakness, weakness and headaches.  Psychiatric/Behavioral: Positive for depression and hallucinations. Negative for memory loss, substance abuse and suicidal ideas. The patient has insomnia. The patient is not nervous/anxious.      Physical Exam:  Constitution: mild distress, obese, cooperative  HENT: /AT Eyes: no scleral icterus or injection  Cardio: RRR, no m/r/g  Respiratory: CTA, no w/r/r  Abdominal: NTTP, non-distended, +BS MSK: moving all extremities, no pitting edema  Neuro: a&ox3, no asterixis,   Skin: no jaundice, c/d/i    Vitals:   03/05/19 1052  BP: 131/81  Pulse: 63  Temp: 98.1 F (36.7 C)  TempSrc: Oral  SpO2: 96%  Weight: 264 lb 3.2 oz (119.8 kg)  Height: 6' 5.5" (1.969 m)    Assessment & Plan:   See Encounters Tab for problem based charting.  Patient discussed with Dr. Lynnae January

## 2019-03-05 NOTE — Assessment & Plan Note (Signed)
He has been having difficulty sleeping for the past two months, and this is why his wife initially brought him to the ER for his last admission. He states he sleeps for about 30-40 minutes at a time and wakes up hallucinating. He states he hallucinates more at night but sometimes in the day. E.g. of what he sees include worms crawling in the carpet, a beetle on someone for hours at a time during HD, and the feeling of someone standing over him while playing video games. His wife states he also has been talking to dead relatives and other people at night. He remembers these conversations although he states he does not have other auditory hallucinations or anyone telling him things to do. This occurred once before last July and resolved on its own. He was referred for sleep study then but was unable to complete it. He was referred again during hospital admission, it was done two days ago but results are not yet available. He is alert and oriented to self, time, and place and interacts normally with normal affect and cognition. He denies alcohol or drug use. His only recent med His insomnia may be causing his hallucinations, and will await sleep study results. He additionally has HIV for which he takes biktarvy, differential includes medication AE or infection. He states he is compliant with his biktarvy but will obtain HIV quant and CD-4. History of transaminitis while hospitalized. He does not have signs of hepatic encephalopathy currently. Stroke also must be considered with his comorbidity and atherosclerotic disease on CT in January.    - HIV quant and CD-4 count - CMP  - f/u sleep study results  - MRI ordered

## 2019-03-05 NOTE — Assessment & Plan Note (Signed)
History of Afib, thought to have contributed to recent volume overload as he is compliant with HD. Currently taking amiodarone, metoprolol and warfarin. NSR on auscultation today. Last saw cardiology yesterday.   - cont. Current medications

## 2019-03-05 NOTE — Patient Instructions (Signed)
Thank you for allowing [place] to provide your care today. Today we discussed your insomnia, hallucinations, and type II diabetes.   I have ordered the following labs for you:  Complete metabolic panel, HIV quantification, and CD-4 count    I will call if any are abnormal.    Today we made the following changes to your medications:   Please follow-up to have your MRI as soon as called.   I will find out the results of your sleep study. Please call the office in the next couple days if you have not been contacted.   Please schedule a follow-up appointment with infectious disease.   Please follow-up in two weeks.    Should you have any questions or concerns please call the internal medicine clinic at [phone].

## 2019-03-06 DIAGNOSIS — G4736 Sleep related hypoventilation in conditions classified elsewhere: Secondary | ICD-10-CM

## 2019-03-06 DIAGNOSIS — G4733 Obstructive sleep apnea (adult) (pediatric): Secondary | ICD-10-CM

## 2019-03-06 NOTE — Procedures (Signed)
Patient Name: Thomas [name], [place] Date: 03/02/2019 Gender: Male D.O.B: [birth year] Age (years): 52 Referring Provider: Bartholomew Crews Height (inches): 53 Interpreting Physician: Baird Lyons MD, ABSM Weight (lbs): 275 RPSGT: Laren Everts BMI: 33 MRN: [id number] Neck Size: 19.00  CLINICAL INFORMATION Sleep Study Type: NPSG Indication for sleep study: Diabetes, Excessive Daytime Sleepiness, Fatigue, Hypertension, Obesity, Witnessed Apneas  Epworth Sleepiness Score: 15  SLEEP STUDY TECHNIQUE As per the AASM Manual for the Scoring of Sleep and Associated Events v2.3 (April 2016) with a hypopnea requiring 4% desaturations.  The channels recorded and monitored were frontal, central and occipital EEG, electrooculogram (EOG), submentalis EMG (chin), nasal and oral airflow, thoracic and abdominal wall motion, anterior tibialis EMG, snore microphone, electrocardiogram, and pulse oximetry.  MEDICATIONS Medications self-administered by patient taken the night of the study : AMIODARONE, COUMADIN, METOPROLOL SUCCINATE, Ranexa, Rosuvastatin, Lovaza  SLEEP ARCHITECTURE The study was initiated at 11:00:36 PM and ended at 5:13:00 AM.  Sleep onset time was 0.3 minutes and the sleep efficiency was 47.0%%. The total sleep time was 175 minutes.  Stage REM latency was N/A minutes.  The patient spent 26.9%% of the night in stage N1 sleep, 73.1%% in stage N2 sleep, 0.0%% in stage N3 and 0% in REM.  Alpha intrusion was absent.  Supine sleep was 4.81%.  RESPIRATORY PARAMETERS The overall apnea/hypopnea index (AHI) was 64.1 per hour. There were 25 total apneas, including 24 obstructive, 1 central and 0 mixed apneas. There were 162 hypopneas and 22 RERAs.  The AHI during Stage REM sleep was N/A per hour.  AHI while supine was 78.4 per hour.  The mean oxygen saturation was 85.5%. The minimum SpO2 during sleep was 72.0%.  soft snoring was noted during this study.  CARDIAC DATA The 2  lead EKG demonstrated sinus rhythm. The mean heart rate was 72.8 beats per minute. Other EKG findings include: PVCs.  LEG MOVEMENT DATA The total PLMS were 0 with a resulting PLMS index of 0.0. Associated arousal with leg movement index was 0.0 .  IMPRESSIONS - Severe obstructive sleep apnea occurred during this study (AHI = 64.1/h). - Severely fragmented sleep with frequent wakings. Insufficient early sleep to meet protocol requirements for split CPAP titraation. - No significant central sleep apnea occurred during this study (CAI = 0.3/h). - Moderate oxygen desaturation was noted during this study (Min O2 = 72.0%). Mean sat 86.4%.  - Time with O2 saturation 88% or less was 248 minutes. - The patient snored with soft snoring volume. - EKG findings include PVCs. - Clinically significant periodic limb movements did not occur during sleep. No significant associated arousals.  DIAGNOSIS - Obstructive Sleep Apnea (327.23 [G47.33 ICD-10]) - Nocturnal Hypoxemia (327.26 [G47.36 ICD-10]  RECOMMENDATIONS - Because of the nocturnal hypoxemia, suggest return for CPAP titration sleep study. This will indicate if CPAP is sufficient to correct the nocturnal hypoxemia. - Be careful with alcohol, sedatives and other CNS depressants that may worsen sleep apnea and disrupt normal sleep architecture. - Sleep hygiene should be reviewed to assess factors that may improve sleep quality. - Weight management and regular exercise should be initiated or continued if appropriate.  [Electronically signed] 03/06/2019 12:26 PM  Baird Lyons MD, [place], American Board of Sleep Medicine   NPI: [id number]                          [place], [place] of [facility]  ELECTRONICALLY SIGNED ON:  03/06/2019, 12:22  PM [facility] PH: [phone]   FX: [phone] ACCREDITED BY THE AMERICAN ACADEMY OF SLEEP MEDICINE

## 2019-03-08 ENCOUNTER — Other Ambulatory Visit (INDEPENDENT_AMBULATORY_CARE_PROVIDER_SITE_OTHER): Payer: Medicare Other

## 2019-03-08 ENCOUNTER — Telehealth: Payer: Self-pay | Admitting: *Deleted

## 2019-03-08 ENCOUNTER — Ambulatory Visit: Payer: Medicare Other | Admitting: Pharmacist

## 2019-03-08 ENCOUNTER — Telehealth: Payer: Self-pay | Admitting: Internal Medicine

## 2019-03-08 ENCOUNTER — Other Ambulatory Visit: Payer: Self-pay | Admitting: Internal Medicine

## 2019-03-08 DIAGNOSIS — Z21 Asymptomatic human immunodeficiency virus [HIV] infection status: Secondary | ICD-10-CM | POA: Diagnosis not present

## 2019-03-08 DIAGNOSIS — G4700 Insomnia, unspecified: Secondary | ICD-10-CM

## 2019-03-08 DIAGNOSIS — R740 Nonspecific elevation of levels of transaminase and lactic acid dehydrogenase [LDH]: Secondary | ICD-10-CM | POA: Diagnosis not present

## 2019-03-08 DIAGNOSIS — R7401 Elevation of levels of liver transaminase levels: Secondary | ICD-10-CM

## 2019-03-08 DIAGNOSIS — G4733 Obstructive sleep apnea (adult) (pediatric): Secondary | ICD-10-CM

## 2019-03-08 NOTE — Telephone Encounter (Signed)
Pt came from lab asking about his sleep study, he is irritated that no one has called, informed him it is quite possible that dr Tarri Abernethy has not seen the results. Will continue to wait to hear from dr Tarri Abernethy, text message sent. Pt was assured dr Tarri Abernethy would be in touch soon

## 2019-03-08 NOTE — Telephone Encounter (Signed)
I am currently on nights. Patient's sleep study is consistent with OSA. I have already placed an order for a CPAP titration study. Thank you.

## 2019-03-08 NOTE — Telephone Encounter (Signed)
Patient recently completed sleep study. Documented approximately AHI of 64/h. Will send in order for CPAP titration study.

## 2019-03-08 NOTE — Progress Notes (Signed)
S: Thomas [name] is a 53 y.o. male reports to clinical pharmacist appointment for medication management and review. Patient did not bring medication bottles. Patient is accompanied by family, who assist at home with medication management.  Allergies  Allergen Reactions  . Oxycodone-Acetaminophen Other (See Comments)    Hallucinations/delirum    Current Outpatient Medications:  .  Accu-Chek FastClix Lancets MISC, Check blood sugar 3 times a day, Disp: 306 each, Rfl: 3 .  acetaminophen (TYLENOL) 325 MG tablet, Take 650 mg by mouth every 6 (six) hours as needed (for headaches). , Disp: , Rfl:  .  amiodarone (PACERONE) 200 MG tablet, Take 1 tablet (200 mg total) by mouth daily., Disp: 30 tablet, Rfl: 11 .  aspirin 81 MG chewable tablet, Chew 1 tablet (81 mg total) by mouth daily., Disp: 90 tablet, Rfl: 1 .  BIKTARVY 50-200-25 MG TABS tablet, TAKE 1 TABLET BY MOUTH DAILY., Disp: 30 tablet, Rfl: 0 .  cinacalcet (SENSIPAR) 90 MG tablet, Take 90 mg by mouth daily., Disp: , Rfl: 6 .  ferric citrate (AURYXIA) 1 GM 210 MG(Fe) tablet, Take 420-840 mg by mouth See admin instructions. Take 840 mg by mouth three times a day after meals and 420 mg after each snack, Disp: , Rfl:  .  ferric gluconate 125 mg in sodium chloride 0.9 % 100 mL, Inject 125 mg into the vein Every Tuesday,Thursday,and Saturday with dialysis., Disp: , Rfl:  .  gabapentin (NEURONTIN) 100 MG capsule, Take 1 capsule (100 mg total) by mouth every dialysis., Disp: 15 capsule, Rfl: 1 .  glucose blood (ACCU-CHEK GUIDE) test strip, Use as instructed to check blood sugar 3 times a day, Disp: 300 each, Rfl: 3 .  insulin glargine (LANTUS) 100 unit/mL SOPN, Inject 0.3 mLs (30 Units total) into the skin at bedtime., Disp: 15 mL, Rfl: 11 .  insulin lispro (HUMALOG) 100 UNIT/ML KiwkPen, Please inject 10 units, twice daily, ~10 min before your two largest meals of the day., Disp: 15 mL, Rfl: 11 .  Insulin Pen Needle 32G X 4 MM MISC, Use to inject both  mealtime and long acting insulins up to 3 injections  a day, Disp: 300 each, Rfl: 3 .  linagliptin (TRADJENTA) 5 MG TABS tablet, Take 1 tablet (5 mg total) by mouth daily., Disp: 90 tablet, Rfl: 1 .  metoprolol succinate (TOPROL-XL) 100 MG 24 hr tablet, TAKE 1 TABLET BY MOUTH ONCE DAILY TAKE WITH OR IMMEDIATELY FOLLOWING A MEAL, Disp: 90 tablet, Rfl: 1 .  nitroGLYCERIN (NITROSTAT) 0.4 MG SL tablet, Place 1 tablet (0.4 mg total) under the tongue every 5 (five) minutes as needed for chest pain., Disp: 30 tablet, Rfl: 0 .  omega-3 acid ethyl esters (LOVAZA) 1 g capsule, Take 2 capsules (2 g total) by mouth 2 (two) times daily., Disp: 120 capsule, Rfl: 11 .  pantoprazole (PROTONIX) 40 MG tablet, Take 2 tablets (80 mg total) by mouth daily., Disp: 60 tablet, Rfl: 1 .  polycarbophil (FIBERCON) 625 MG tablet, Take 1 tablet (625 mg total) by mouth daily., Disp: 30 tablet, Rfl: 0 .  ranolazine (RANEXA) 500 MG 12 hr tablet, Take 1 tablet (500 mg total) by mouth 2 (two) times daily., Disp: 60 tablet, Rfl: 0 .  rosuvastatin (CRESTOR) 20 MG tablet, Take 0.5 tablets (10 mg total) by mouth daily., Disp: 90 tablet, Rfl: 1 .  Skin Protectants, Misc. (EUCERIN) cream, Apply topically as needed for dry skin., Disp: 454 g, Rfl: 0 .  warfarin (COUMADIN) 7.5 MG tablet,  TAKE 1/2 TABLET BY MOUTH DAILY, EXCEPT 1 TABLET ON Monday, Wednesday, AND Saturday OR AS DIRECTED BY ANTICOAGULATION CLINIC. (Patient taking differently: Take 3.75-7.5 mg by mouth at bedtime. Take 3.75 mg by mouth at bedtime on Sun/Tues/Thurs/Fri and 7.5 mg on Mon/Wed/Sat), Disp: 90 tablet, Rfl: 0 Past Medical History:  Diagnosis Date  . Anemia   . Ascending aortic aneurysm ([place])   . CAD (coronary artery disease), native coronary artery 01/22/2019  . Chronic combined systolic and diastolic CHF (congestive heart failure) ([place])   . Diabetic foot ulcer ([place]) 09/03/2017  . Diabetic wet gangrene of the foot ([place]) 09/04/2017  . ESRD (end stage renal disease) on  dialysis [facility])    [place] [address], [place] (10/15/2018)  . Headache    "monthly" (06/15/2018)  . HIV disease ([place])   . Hyperlipidemia LDL goal <70 01/22/2019  . Hyperparathyroidism, secondary ([place])   . Hypertension   . Hypertensive heart disease with end stage renal disease on dialysis ([place]) 03/13/2017  . Mallory-Weiss tear 2018  . PAF (paroxysmal atrial fibrillation) ([place])   . Paroxysmal atrial flutter ([place])   . Pneumonia X 1  . QT prolongation   . Subacute osteomyelitis, right ankle and foot ([place])   . SVT (supraventricular tachycardia) ([place])    ? afib or atrial flutter s/p TEE/DCCV with subsequent ablation due to reoccurrence in [place]  . Type 2 diabetes mellitus ([place])    .history  Social History   Socioeconomic History  . Marital status: Married    Spouse name: kim  . Number of children: 3  . Years of education: college  . Highest education level: Not on file  Occupational History  . Occupation: disabled  Social Needs  . Financial resource strain: Not on file  . Food insecurity:    Worry: Not on file    Inability: Not on file  . Transportation needs:    Medical: Not on file    Non-medical: Not on file  Tobacco Use  . Smoking status: Never Smoker  . Smokeless tobacco: Never Used  Substance and Sexual Activity  . Alcohol use: Never    Frequency: Never  . Drug use: Never  . Sexual activity: Not Currently  Lifestyle  . Physical activity:    Days per week: Not on file    Minutes per session: Not on file  . Stress: Not on file  Relationships  . Social connections:    Talks on phone: Not on file    Gets together: Not on file    Attends religious service: Not on file    Active member of club or organization: Not on file    Attends meetings of clubs or organizations: Not on file    Relationship status: Not on file  Other Topics Concern  . Not on file  Social History Narrative   Lives in [place] with wife.   Family History  Problem Relation Age of Onset   . Heart disease Mother   . Diabetes Mother   . Heart disease Father   . Cancer Neg Hx     O:  BMP Latest Ref Rng & Units 02/24/2019 02/23/2019 02/23/2019  Glucose 70 - 99 mg/dL 204(H) 167(H) 200(H)  BUN 6 - 20 mg/dL 33(H) 16 30(H)  Creatinine 0.61 - 1.24 mg/dL 6.48(H) 4.35(H) 6.98(H)  Sodium 135 - 145 mmol/L 136 136 137  Potassium 3.5 - 5.1 mmol/L 3.5 3.3(L) 3.7  Chloride 98 - 111 mmol/L 97(L) 97(L) 97(L)  CO2 22 -  32 mmol/L 26 27 25   Calcium 8.9 - 10.3 mg/dL 9.4 9.3 9.6        Component Value Date/Time   CHOL 89 01/23/2019 0314   HDL 16 (L) 01/23/2019 0314   LDLCALC UNABLE TO CALCULATE IF TRIGLYCERIDE OVER 400 mg/dL 01/23/2019 0314   TRIG 409 (H) 01/23/2019 0314   GLUCOSE 204 (H) 02/24/2019 0804   HGBA1C 11.9 (H) 01/23/2019 0314   NA 136 02/24/2019 0804   K 3.5 02/24/2019 0804   CL 97 (L) 02/24/2019 0804   CO2 26 02/24/2019 0804   BUN 33 (H) 02/24/2019 0804   CREATININE 6.48 (H) 02/24/2019 0804   CREATININE 9.13 (H) 03/10/2017 1217   CALCIUM 9.4 02/24/2019 0804   GFRNONAA 9 (L) 02/24/2019 0804   GFRNONAA 6 (L) 03/10/2017 1217   GFRAA 10 (L) 02/24/2019 0804   GFRAA 7 (L) 03/10/2017 1217   AST 153 (H) 02/24/2019 0804   ALT 343 (H) 02/24/2019 0804   WBC 9.2 02/24/2019 0628   HGB 10.3 (L) 02/24/2019 0628   HCT 32.2 (L) 02/24/2019 0628   PLT 172 02/24/2019 0628   TSH 1.480 01/22/2019 2209   TSH 1.600 08/05/2018 1123   Ht Readings from Last 2 Encounters:  03/05/19 6' 5.5" (1.969 m)  03/03/19 6' 5.5" (1.969 m)   Wt Readings from Last 2 Encounters:  03/05/19 264 lb 3.2 oz (119.8 kg)  03/03/19 275 lb 9.6 oz (125 kg)   There is no height or weight on file to calculate BMI. BP Readings from Last 3 Encounters:  03/05/19 131/81  03/03/19 (!) 142/72  02/24/19 (!) 140/106     A/P: A drug regimen assessment was performed, including review of allergies, interactions, disease-state management, dosing and immunization history. Medications were reviewed with the patient,  including name, instructions, indication, goals of therapy, potential side effects, importance of adherence, and safe use.   Spoke with patient confirming medication adherence and fill history; patient expressed there was an issue receiving two medications and that his pharmacy needed the red/white/blue insurance card (Part A from Medicare) and the patient couldn't afford them.. Expressed to patient that if the pharmacy is requesting an insurance card that it would possibly help with the cost of their medication. Patient also stated that he had all of his medications. I believe that the patient was accounting for the samples that were given to him by Dr.Seawell. Patient did not bring in medications for review. Patient expressed that the only issue he is having is insomnia at night. After a review of the patients medications, there were no drugs identified that were contributing to his insomnia. Patient was concerned about the cost of the sleep apnea machine if he was going to get one. The patient was directed to take their insurance cards to the DME supplier if they were supposed to be picking one up.    An after visit summary was provided and patient advised to follow up in 1 week or sooner if any changes in condition or questions regarding medications arise.   The patient and family verbalized understanding of information provided by repeating back concepts discussed.

## 2019-03-09 ENCOUNTER — Encounter: Payer: Self-pay | Admitting: Cardiology

## 2019-03-09 DIAGNOSIS — Z5181 Encounter for therapeutic drug level monitoring: Secondary | ICD-10-CM

## 2019-03-09 LAB — CMP14 + ANION GAP
ALT: 97 IU/L — ABNORMAL HIGH (ref 0–44)
ANION GAP: 23 mmol/L — AB (ref 10.0–18.0)
AST: 39 IU/L (ref 0–40)
Albumin/Globulin Ratio: 1.5 (ref 1.2–2.2)
Albumin: 4 g/dL (ref 3.8–4.9)
Alkaline Phosphatase: 109 IU/L (ref 39–117)
BUN/Creatinine Ratio: 6 — ABNORMAL LOW (ref 9–20)
BUN: 53 mg/dL — ABNORMAL HIGH (ref 6–24)
Bilirubin Total: 1 mg/dL (ref 0.0–1.2)
CO2: 21 mmol/L (ref 20–29)
Calcium: 10.8 mg/dL — ABNORMAL HIGH (ref 8.7–10.2)
Chloride: 95 mmol/L — ABNORMAL LOW (ref 96–106)
Creatinine, Ser: 9.1 mg/dL — ABNORMAL HIGH (ref 0.76–1.27)
GFR calc Af Amer: 7 mL/min/{1.73_m2} — ABNORMAL LOW (ref >59)
GFR calc non Af Amer: 6 mL/min/{1.73_m2} — ABNORMAL LOW (ref >59)
Globulin, Total: 2.7 g/dL (ref 1.5–4.5)
Glucose: 99 mg/dL (ref 65–99)
Potassium: 3.8 mmol/L (ref 3.5–5.2)
Sodium: 139 mmol/L (ref 134–144)
Total Protein: 6.7 g/dL (ref 6.0–8.5)

## 2019-03-09 LAB — T-HELPER CELLS (CD4) COUNT (NOT AT ARMC)
CD4 % Helper T Cell: 48 % (ref 33–55)
CD4 T Cell Abs: 190 /uL — ABNORMAL LOW (ref 400–2700)

## 2019-03-09 LAB — HIV-1 RNA QUANT-NO REFLEX-BLD
HIV-1 RNA Viral Load Log: 2.681 log10copy/mL
HIV-1 RNA Viral Load: 480 copies/mL

## 2019-03-09 NOTE — Progress Notes (Signed)
This encounter was created in error - please disregard.

## 2019-03-10 ENCOUNTER — Other Ambulatory Visit: Payer: Self-pay

## 2019-03-10 ENCOUNTER — Ambulatory Visit (INDEPENDENT_AMBULATORY_CARE_PROVIDER_SITE_OTHER): Payer: Medicare Other | Admitting: Infectious Diseases

## 2019-03-10 ENCOUNTER — Encounter: Payer: Self-pay | Admitting: Infectious Diseases

## 2019-03-10 VITALS — BP 144/79 | HR 70 | Temp 98.0°F | Ht 77.0 in

## 2019-03-10 DIAGNOSIS — B20 Human immunodeficiency virus [HIV] disease: Secondary | ICD-10-CM | POA: Diagnosis present

## 2019-03-10 DIAGNOSIS — Z79899 Other long term (current) drug therapy: Secondary | ICD-10-CM | POA: Diagnosis not present

## 2019-03-10 DIAGNOSIS — G4733 Obstructive sleep apnea (adult) (pediatric): Secondary | ICD-10-CM | POA: Diagnosis not present

## 2019-03-10 DIAGNOSIS — N186 End stage renal disease: Secondary | ICD-10-CM

## 2019-03-10 DIAGNOSIS — Z992 Dependence on renal dialysis: Secondary | ICD-10-CM | POA: Diagnosis not present

## 2019-03-10 MED ORDER — BICTEGRAVIR-EMTRICITAB-TENOFOV 50-200-25 MG PO TABS: 1.0000 | ORAL_TABLET | Freq: Every day | ORAL | 11 refills | Status: AC

## 2019-03-10 NOTE — Progress Notes (Signed)
Internal Medicine Clinic Attending  Case discussed with Dr. Seawell at the time of the visit.  We reviewed the resident's history and exam and pertinent patient test results.  I agree with the assessment, diagnosis, and plan of care documented in the resident's note.    

## 2019-03-10 NOTE — Patient Instructions (Signed)
It was nice to meet you both.   I will send a note to Dr. Tarri Abernethy and his team to see if they can reach out to you about the next steps on your CPAP machine. I think you will feel like a new man once this arrives.   Please come back to see Dr. Baxter Flattery in 3 months - we will repeat your blood work at this time.

## 2019-03-10 NOTE — Progress Notes (Signed)
Name: Thomas [name]  DOB: 03-23-1966 MRN: [id number] PCP: [name], MD    Patient Active Problem List   Diagnosis Date Noted  . Obstructive sleep apnea 03/12/2019  . Polypharmacy 03/12/2019  . Transaminitis 03/05/2019  . QT prolongation 02/08/2019  . Lesion of finger 02/08/2019  . Pain and numbness of upper extremity 02/08/2019  . Insomnia 02/08/2019  . Hyperlipidemia LDL goal <70 01/22/2019  . CAD (coronary artery disease), native coronary artery 01/22/2019  . Unstable angina ([place]) 01/22/2019  . Ascending aortic aneurysm ([place]) 12/07/2018  . Noninfectious gastroenteritis 10/27/2018  . 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 Chest pain 10/15/2018  . Non-ST elevation (NSTEMI) myocardial infarction ([place])   . Chest pain with moderate risk for cardiac etiology 06/15/2018  . Atrial flutter ([place]) 06/15/2018  . Somniloquy 06/12/2018  . Encounter for therapeutic drug monitoring 05/27/2018  . Acute on chronic systolic heart failure ([place])   . Murmur, cardiac 02/13/2018  . Mallory-Weiss tear 01/16/2018  . Morbid obesity ([place])   . Fall   . Subtherapeutic international normalized ratio (INR)   . History of left below knee amputation ([place]) 12/11/2017  . History of supraventricular tachycardia   . S/P bilateral BKA (below knee amputation) ([place])   . Anemia of chronic disease   . Non-ischemic cardiomyopathy ([place])   . Hypokalemia 09/04/2017  . Essential hypertension 09/03/2017  . GERD (gastroesophageal reflux disease) 09/03/2017  . GIB (gastrointestinal bleeding) 07/26/2017  . Anemia due to end stage renal disease ([place]) 07/26/2017  . PAF (paroxysmal atrial fibrillation) ([place]) 07/26/2017  . Symptomatic anemia 07/26/2017  . End stage renal disease ([place]) 05/05/2017  . HIV disease ([place]) 03/25/2017  . Hypertensive heart disease with end stage renal disease on dialysis ([place]) 03/13/2017  . ESRD on dialysis ([place]) 03/13/2017  . Type 2 diabetes mellitus with complication ([place]) 68/10/5725      Brief Narrative:  Thomas [name] is a 53 y.o. man with HIV disease Dx approx 2008. He is also ESRD on dialysis.  HIV Risk: heterosexual, denies IVDU OI Hx: unknown  Previous Regimens: . Biktarvy  Genotypes: . unknown  Subjective:  CC: HIV follow up care. Very tired, fatigued, headaches.   HPI: He last saw Dr. Baxter Flattery in March 2018 and was doing well on Biktarvy at that time. He has had a lot of medication changes over this time frame however and was requested to be seen by provider by pharmacy staff before refills. He is annoyed that we asked him to do this and thinks it has something to do with insurance filing.  He reassures me that he is still taking his biktarvy once a day regularly. He does not miss doses and understands that this is "the pill" he should not miss. His main concern today is in relation to his fatigue, inability to sleep and chest palpitations. He had a sleep study and has a face-mask but no machine. Does not know results of the study. Frustrated because of lack of sleep and wife is tired too. They are requesting I help get them an update on this for him.   Depression screen [facility] 2/9 03/10/2019  Decreased Interest 0  Down, Depressed, Hopeless 0  PHQ - 2 Score 0  Altered sleeping 1  Tired, decreased energy -  Change in appetite 0  Feeling bad or failure about yourself  0  Trouble concentrating 0  Moving slowly or fidgety/restless 0  Suicidal thoughts 0  PHQ-9 Score 1  Difficult doing work/chores Not difficult at all  He is followed by internal medicine program.   Review of Systems  Constitutional: Positive for malaise/fatigue. Negative for chills, fever and weight loss.  Eyes: Negative for blurred vision.  Respiratory: Positive for shortness of breath. Negative for cough.   Cardiovascular: Positive for palpitations and leg swelling. Negative for chest pain.  Gastrointestinal: Negative for abdominal pain, diarrhea and vomiting.  Genitourinary:        Anuric  Musculoskeletal: Negative for myalgias.  Skin: Negative for rash.  Neurological: Positive for dizziness, weakness and headaches.  Psychiatric/Behavioral: The patient has insomnia.     Past Medical History:  Diagnosis Date  . Anemia   . Ascending aortic aneurysm ([place])   . CAD (coronary artery disease), native coronary artery 01/22/2019  . Chronic combined systolic and diastolic CHF (congestive heart failure) ([place])   . Diabetic foot ulcer ([place]) 09/03/2017  . Diabetic wet gangrene of the foot ([place]) 09/04/2017  . ESRD (end stage renal disease) on dialysis [facility])    [place] [address], [place], [place] (10/15/2018)  . Headache    "monthly" (06/15/2018)  . HIV disease ([place])   . Hyperlipidemia LDL goal <70 01/22/2019  . Hyperparathyroidism, secondary ([place])   . Hypertension   . Hypertensive heart disease with end stage renal disease on dialysis ([place]) 03/13/2017  . Mallory-Weiss tear 2018  . PAF (paroxysmal atrial fibrillation) ([place])   . Paroxysmal atrial flutter ([place])   . Pneumonia X 1  . QT prolongation   . Subacute osteomyelitis, right ankle and foot ([place])   . SVT (supraventricular tachycardia) ([place])    ? afib or atrial flutter s/p TEE/DCCV with subsequent ablation due to reoccurrence in [place]  . Type 2 diabetes mellitus ([place])     Outpatient Medications Prior to Visit  Medication Sig Dispense Refill  . Accu-Chek FastClix Lancets MISC Check blood sugar 3 times a day 306 each 3  . acetaminophen (TYLENOL) 325 MG tablet Take 650 mg by mouth every 6 (six) hours as needed (for headaches).     Marland Kitchen amiodarone (PACERONE) 200 MG tablet Take 1 tablet (200 mg total) by mouth daily. 30 tablet 11  . aspirin 81 MG chewable tablet Chew 1 tablet (81 mg total) by mouth daily. 90 tablet 1  . cinacalcet (SENSIPAR) 90 MG tablet Take 90 mg by mouth daily.  6  . ferric citrate (AURYXIA) 1 GM 210 MG(Fe) tablet Take 420-840 mg by mouth See admin instructions. Take 840 mg by mouth three times a day after meals and  420 mg after each snack    . ferric gluconate 125 mg in sodium chloride 0.9 % 100 mL Inject 125 mg into the vein Every Tuesday,Thursday,and Saturday with dialysis.    Marland Kitchen gabapentin (NEURONTIN) 100 MG capsule Take 1 capsule (100 mg total) by mouth every dialysis. 15 capsule 1  . glucose blood (ACCU-CHEK GUIDE) test strip Use as instructed to check blood sugar 3 times a day 300 each 3  . insulin glargine (LANTUS) 100 unit/mL SOPN Inject 0.3 mLs (30 Units total) into the skin at bedtime. 15 mL 11  . insulin lispro (HUMALOG) 100 UNIT/ML KiwkPen Please inject 10 units, twice daily, ~10 min before your two largest meals of the day. 15 mL 11  . Insulin Pen Needle 32G X 4 MM MISC Use to inject both mealtime and long acting insulins up to 3 injections  a day 300 each 3  . linagliptin (TRADJENTA) 5 MG TABS tablet Take 1 tablet (5 mg total) by mouth  daily. 90 tablet 1  . metoprolol succinate (TOPROL-XL) 100 MG 24 hr tablet TAKE 1 TABLET BY MOUTH ONCE DAILY TAKE WITH OR IMMEDIATELY FOLLOWING A MEAL 90 tablet 1  . nitroGLYCERIN (NITROSTAT) 0.4 MG SL tablet Place 1 tablet (0.4 mg total) under the tongue every 5 (five) minutes as needed for chest pain. 30 tablet 0  . omega-3 acid ethyl esters (LOVAZA) 1 g capsule Take 2 capsules (2 g total) by mouth 2 (two) times daily. 120 capsule 11  . pantoprazole (PROTONIX) 40 MG tablet Take 2 tablets (80 mg total) by mouth daily. 60 tablet 1  . polycarbophil (FIBERCON) 625 MG tablet Take 1 tablet (625 mg total) by mouth daily. 30 tablet 0  . ranolazine (RANEXA) 500 MG 12 hr tablet Take 1 tablet (500 mg total) by mouth 2 (two) times daily. 60 tablet 0  . rosuvastatin (CRESTOR) 20 MG tablet Take 0.5 tablets (10 mg total) by mouth daily. 90 tablet 1  . Skin Protectants, Misc. (EUCERIN) cream Apply topically as needed for dry skin. 454 g 0  . warfarin (COUMADIN) 7.5 MG tablet TAKE 1/2 TABLET BY MOUTH DAILY, EXCEPT 1 TABLET ON Monday, Wednesday, AND Saturday OR AS DIRECTED BY  ANTICOAGULATION CLINIC. (Patient taking differently: Take 3.75-7.5 mg by mouth at bedtime. Take 3.75 mg by mouth at bedtime on Sun/Tues/Thurs/Fri and 7.5 mg on Mon/Wed/Sat) 90 tablet 0  . BIKTARVY 50-200-25 MG TABS tablet TAKE 1 TABLET BY MOUTH DAILY. 30 tablet 0   No facility-administered medications prior to visit.      Allergies  Allergen Reactions  . Oxycodone-Acetaminophen Other (See Comments)    Hallucinations/delirum    Social History   Tobacco Use  . Smoking status: Never Smoker  . Smokeless tobacco: Never Used  Substance Use Topics  . Alcohol use: Never    Frequency: Never  . Drug use: Never    Family History  Problem Relation Age of Onset  . Heart disease Mother   . Diabetes Mother   . Heart disease Father   . Cancer Neg Hx     Social History   Substance and Sexual Activity  Sexual Activity Not Currently     Objective:   Vitals:   03/10/19 1452  BP: (!) 144/79  Pulse: 70  Temp: 98 F (36.7 C)  Height: 6\' 5"  (1.956 m)   Body mass index is 31.33 kg/m.  Physical Exam Vitals signs reviewed.  Constitutional:      Comments: Seated in wheelchair. Tired appearing but non-toxic and well nourished.   HENT:     Mouth/Throat:     Mouth: Mucous membranes are moist.     Pharynx: Oropharynx is clear. No oropharyngeal exudate.  Eyes:     Pupils: Pupils are equal, round, and reactive to light.  Neck:     Musculoskeletal: Normal range of motion. No neck rigidity.  Cardiovascular:     Rate and Rhythm: Normal rate and regular rhythm.     Heart sounds: No murmur.  Pulmonary:     Effort: Pulmonary effort is normal. No respiratory distress.     Breath sounds: Normal breath sounds. No wheezing.  Abdominal:     General: Abdomen is flat. Bowel sounds are normal. There is no distension.  Skin:    General: Skin is warm and dry.     Capillary Refill: Capillary refill takes less than 2 seconds.  Neurological:     General: No focal deficit present.     Mental  Status: He is  alert and oriented to person, place, and time.     Lab Results Lab Results  Component Value Date   WBC 9.2 02/24/2019   HGB 10.3 (L) 02/24/2019   HCT 32.2 (L) 02/24/2019   MCV 100.9 (H) 02/24/2019   PLT 172 02/24/2019    Lab Results  Component Value Date   CREATININE 9.10 (H) 03/08/2019   BUN 53 (H) 03/08/2019   NA 139 03/08/2019   K 3.8 03/08/2019   CL 95 (L) 03/08/2019   CO2 21 03/08/2019    Lab Results  Component Value Date   ALT 97 (H) 03/08/2019   AST 39 03/08/2019   ALKPHOS 109 03/08/2019   BILITOT 1.0 03/08/2019    Lab Results  Component Value Date   CHOL 89 01/23/2019   HDL 16 (L) 01/23/2019   LDLCALC UNABLE TO CALCULATE IF TRIGLYCERIDE OVER 400 mg/dL 01/23/2019   TRIG 409 (H) 01/23/2019   CHOLHDL 5.6 01/23/2019   HIV 1 RNA Quant (copies/mL)  Date Value  02/16/2018 <20 NOT DETECTED  03/10/2017 30 (H)   HIV-1 RNA Viral Load (copies/mL)  Date Value  03/08/2019 480   CD4 T Cell Abs (/uL)  Date Value  03/08/2019 190 (L)  02/16/2018 610  12/09/2017 490     Assessment & Plan:   Problem List Items Addressed This Visit      Unprioritized   ESRD on dialysis ([place])    Safe to continue with Biktarvy. R FA AVF looks good.       HIV disease ([place])    He had viral load done at another office that revealed elevated level @ 450 copies. I discussed this with him today. He assures me he has not missed any medications. I reviewed med list extensively with him. Only identified possible DDI is with Iron which I am not sure he takes consistently. Counseled to strictly separate by 4 hours from biktarvy. He denies any other unlisted supplements. I have emphasized the importance of regular visits here to monitor his infection and ensure that with his polypharmacy alone he is on the best, most effective and safest option. He will gladly come back whenever we ask him to.  Return in about 3 months (around 06/10/2019). to see Dr. Baxter Flattery.        Relevant  Medications   bictegravir-emtricitabine-tenofovir AF (BIKTARVY) 50-200-25 MG TABS tablet   Obstructive sleep apnea    Reviewed sleep study with him. He has periods of time where he drops his oxygen sat to the low 70s. High frequency of apneas > 60 times an hour. Will reach out to his PCP team for him to ask for update on next steps as they are not aware.       Polypharmacy    DDIs reviewed with Cassie our clinical pharm d.          Janene Madeira, MSN, NP-C [facility] [place] Pager: [phone] Office: [phone]  03/12/19  4:05 PM

## 2019-03-11 ENCOUNTER — Telehealth: Payer: Self-pay | Admitting: *Deleted

## 2019-03-11 ENCOUNTER — Ambulatory Visit (HOSPITAL_BASED_OUTPATIENT_CLINIC_OR_DEPARTMENT_OTHER): Payer: Medicare Other | Attending: Internal Medicine

## 2019-03-11 NOTE — Telephone Encounter (Signed)
Called pt per Dr Lynnae January b/c pt had cancel his sleep study appt - no answer; left message to call the office. Pt has OSA /needs to re-schedule his appt.

## 2019-03-12 ENCOUNTER — Encounter (HOSPITAL_BASED_OUTPATIENT_CLINIC_OR_DEPARTMENT_OTHER): Payer: Medicare Other

## 2019-03-12 DIAGNOSIS — Z79899 Other long term (current) drug therapy: Secondary | ICD-10-CM | POA: Insufficient documentation

## 2019-03-12 DIAGNOSIS — G4733 Obstructive sleep apnea (adult) (pediatric): Secondary | ICD-10-CM | POA: Insufficient documentation

## 2019-03-12 NOTE — Assessment & Plan Note (Signed)
DDIs reviewed with Cassie our clinical pharm d.

## 2019-03-12 NOTE — Assessment & Plan Note (Signed)
He had viral load done at another office that revealed elevated level @ 450 copies. I discussed this with him today. He assures me he has not missed any medications. I reviewed med list extensively with him. Only identified possible DDI is with Iron which I am not sure he takes consistently. Counseled to strictly separate by 4 hours from biktarvy. He denies any other unlisted supplements. I have emphasized the importance of regular visits here to monitor his infection and ensure that with his polypharmacy alone he is on the best, most effective and safest option. He will gladly come back whenever we ask him to.  Return in about 3 months (around 06/10/2019). to see Dr. Baxter Flattery.

## 2019-03-12 NOTE — Assessment & Plan Note (Signed)
Reviewed sleep study with him. He has periods of time where he drops his oxygen sat to the low 70s. High frequency of apneas > 60 times an hour. Will reach out to his PCP team for him to ask for update on next steps as they are not aware.

## 2019-03-12 NOTE — Assessment & Plan Note (Signed)
Safe to continue with Biktarvy. R FA AVF looks good.

## 2019-03-15 MED FILL — BIKTARVY 50-200-25 MG TABS: 50-200-25 | 30 days supply | Qty: 30 | Fill #0

## 2019-03-16 ENCOUNTER — Other Ambulatory Visit (HOSPITAL_COMMUNITY): Payer: Self-pay | Admitting: Internal Medicine

## 2019-03-19 ENCOUNTER — Ambulatory Visit (HOSPITAL_COMMUNITY): Admission: RE | Admit: 2019-03-19 | Payer: Medicare Other | Source: Ambulatory Visit

## 2019-03-19 ENCOUNTER — Ambulatory Visit: Payer: Medicare Other

## 2019-03-26 ENCOUNTER — Other Ambulatory Visit: Payer: Self-pay | Admitting: Dietician

## 2019-03-26 DIAGNOSIS — E118 Type 2 diabetes mellitus with unspecified complications: Secondary | ICD-10-CM

## 2019-03-26 NOTE — Telephone Encounter (Signed)
Thomas [name] is a 53 y.o. male who was contacted on behalf of [facility] Geriatrics Task Force.   Left 3 messages on different days in case he was at dialysis for Mr. Heroux to return call.  Checked diabetes medicine and it appears he needs a refill on: Trajenta. Will request on his behalf.  Butch Penny Morene Cecilio, RD 03/30/2019 10:06 AM.

## 2019-03-26 NOTE — Telephone Encounter (Signed)
-----   Message from Resa Miner, RD sent at 03/26/2019  9:51 AM EDT ----- Regarding: Lauren's GTF pt

## 2019-03-30 NOTE — Telephone Encounter (Signed)
Trajenta was refilled on 12/07/18 #90 x 1 RF. Called pt - no answer and mailbox is full, unable to leave a message.

## 2019-04-01 MED ORDER — LINAGLIPTIN 5 MG PO TABS: 5.0000 mg | ORAL_TABLET | Freq: Every day | ORAL | 1 refills | Status: DC

## 2019-04-02 NOTE — Telephone Encounter (Signed)
Thomas [name] is a 53 y.o. male who was contacted on behalf of [facility] Geriatrics Task Force.  Left message for [name]  to call the main number office number if he needs anything or has any issues with his diabetes or blood pressure care or medicines. He has called me 3 times and left messages in addition to the many times I tried calling him.

## 2019-04-07 ENCOUNTER — Telehealth: Payer: Self-pay | Admitting: Pharmacist

## 2019-04-07 NOTE — Telephone Encounter (Signed)
LMOM for pt - he is overdue for INR check and missed last appt in mid March.

## 2019-04-12 MED FILL — BIKTARVY 50-200-25 MG TABS: 50-200-25 | 30 days supply | Qty: 30 | Fill #1

## 2019-04-13 ENCOUNTER — Telehealth: Payer: Self-pay

## 2019-04-13 NOTE — Telephone Encounter (Signed)

## 2019-04-14 ENCOUNTER — Other Ambulatory Visit: Payer: Self-pay

## 2019-04-14 ENCOUNTER — Ambulatory Visit (INDEPENDENT_AMBULATORY_CARE_PROVIDER_SITE_OTHER): Payer: Medicare Other | Admitting: Pharmacist

## 2019-04-14 DIAGNOSIS — I4891 Unspecified atrial fibrillation: Secondary | ICD-10-CM

## 2019-04-14 DIAGNOSIS — Z5181 Encounter for therapeutic drug level monitoring: Secondary | ICD-10-CM | POA: Diagnosis not present

## 2019-04-14 LAB — POCT INR: INR: 2.5 (ref 2.0–3.0)

## 2019-04-29 LAB — PROTIME-INR: INR: 1.3 — AB (ref <=1.1)

## 2019-05-03 ENCOUNTER — Ambulatory Visit (INDEPENDENT_AMBULATORY_CARE_PROVIDER_SITE_OTHER): Payer: Medicare Other | Admitting: Pharmacist Clinician (PhC)/ Clinical Pharmacy Specialist

## 2019-05-03 ENCOUNTER — Other Ambulatory Visit: Payer: Medicare Other

## 2019-05-03 DIAGNOSIS — Z5181 Encounter for therapeutic drug level monitoring: Secondary | ICD-10-CM | POA: Diagnosis not present

## 2019-05-06 MED FILL — BIKTARVY 50-200-25 MG TABS: 50-200-25 | 30 days supply | Qty: 30 | Fill #2

## 2019-05-11 ENCOUNTER — Telehealth: Payer: Self-pay

## 2019-05-11 NOTE — Telephone Encounter (Signed)

## 2019-05-12 ENCOUNTER — Other Ambulatory Visit: Payer: Self-pay

## 2019-05-12 ENCOUNTER — Ambulatory Visit (INDEPENDENT_AMBULATORY_CARE_PROVIDER_SITE_OTHER): Payer: Medicare Other | Admitting: Pharmacist

## 2019-05-12 DIAGNOSIS — I4891 Unspecified atrial fibrillation: Secondary | ICD-10-CM

## 2019-05-12 DIAGNOSIS — Z5181 Encounter for therapeutic drug level monitoring: Secondary | ICD-10-CM

## 2019-05-12 LAB — POCT INR: INR: 4 — AB (ref 2.0–3.0)

## 2019-05-19 ENCOUNTER — Other Ambulatory Visit: Payer: Self-pay

## 2019-05-19 ENCOUNTER — Ambulatory Visit (INDEPENDENT_AMBULATORY_CARE_PROVIDER_SITE_OTHER): Payer: Medicare Other | Admitting: Pharmacist

## 2019-05-19 DIAGNOSIS — Z5181 Encounter for therapeutic drug level monitoring: Secondary | ICD-10-CM | POA: Diagnosis not present

## 2019-05-19 DIAGNOSIS — I4891 Unspecified atrial fibrillation: Secondary | ICD-10-CM

## 2019-05-19 LAB — POCT INR
INR: 3.6 — AB (ref 2.0–3.0)
INR: 3.6 — AB (ref 2.0–3.0)

## 2019-05-19 NOTE — Patient Instructions (Signed)
Description   Skip Coumadin today, then continue taking 1/2 tablet daily except 1 tablet on Mondays and Saturdays.  Recheck INR in 2 weeks.  Coumadin Clinic [phone] Main [phone]

## 2019-05-25 ENCOUNTER — Telehealth: Payer: Self-pay

## 2019-05-25 NOTE — Telephone Encounter (Signed)
YOUR CARDIOLOGY TEAM HAS ARRANGED FOR AN E-VISIT FOR YOUR APPOINTMENT - PLEASE REVIEW IMPORTANT INFORMATION BELOW SEVERAL DAYS PRIOR TO YOUR APPOINTMENT  Due to the recent COVID-19 pandemic, we are transitioning in-person office visits to tele-medicine visits in an effort to decrease unnecessary exposure to our patients, their families, and staff. These visits are billed to your insurance just like a normal visit is. We also encourage you to sign up for MyChart if you have not already done so. You will need a smartphone if possible. For patients that do not have this, we can still complete the visit using a regular telephone but do prefer a smartphone to enable video when possible. You may have a family member that lives with you that can help. If possible, we also ask that you have a blood pressure cuff and scale at home to measure your blood pressure, heart rate and weight prior to your scheduled appointment. Patients with clinical needs that need an in-person evaluation and testing will still be able to come to the office if absolutely necessary. If you have any questions, feel free to call our office.     YOUR PROVIDER WILL BE USING THE FOLLOWING PLATFORM TO COMPLETE YOUR VISIT: Phone call  . IF USING MYCHART - How to Download the MyChart App to Your SmartPhone   - If Apple, go to App Store and type in MyChart in the search bar and download the app. If Android, ask patient to go to Google Play Store and type in MyChart in the search bar and download the app. The app is free but as with any other app downloads, your phone may require you to verify saved payment information or Apple/Android password.  - You will need to then log into the app with your MyChart username and password, and select [facility] as your healthcare provider to link the account.  - When it is time for your visit, go to the MyChart app, find appointments, and click Begin Video Visit. Be sure to Select Allow for your device to  access the Microphone and Camera for your visit. You will then be connected, and your provider will be with you shortly.  **If you have any issues connecting or need assistance, please contact MyChart service desk [phone] ([phone])**  **If using a computer, in order to ensure the best quality for your visit, you will need to use either of the following Internet Browsers: Google Chrome or Microsoft Edge**  . IF USING DOXIMITY or DOXY.ME - The staff will give you instructions on receiving your link to join the meeting the day of your visit.      2-3 DAYS BEFORE YOUR APPOINTMENT  You will receive a telephone call from one of our [facility] team members - your caller ID may say "Unknown caller." If this is a video visit, we will walk you through how to get the video launched on your phone. We will remind you check your blood pressure, heart rate and weight prior to your scheduled appointment. If you have an Apple Watch or Kardia, please upload any pertinent ECG strips the day before or morning of your appointment to MyChart. Our staff will also make sure you have reviewed the consent and agree to move forward with your scheduled tele-health visit.     THE DAY OF YOUR APPOINTMENT  Approximately 15 minutes prior to your scheduled appointment, you will receive a telephone call from one of HeartCare team - your caller ID may say "Unknown caller."    Our staff will confirm medications, vital signs for the day and any symptoms you may be experiencing. Please have this information available prior to the time of visit start. It may also be helpful for you to have a pad of paper and pen handy for any instructions given during your visit. They will also walk you through joining the smartphone meeting if this is a video visit.    CONSENT FOR TELE-HEALTH VISIT - PLEASE REVIEW  I hereby voluntarily request, consent and authorize [facility] and its employed or contracted physicians, physician  assistants, nurse practitioners or other licensed health care professionals (the Practitioner), to provide me with telemedicine health care services (the "Services") as deemed necessary by the treating Practitioner. I acknowledge and consent to receive the Services by the Practitioner via telemedicine. I understand that the telemedicine visit will involve communicating with the Practitioner through live audiovisual communication technology and the disclosure of certain medical information by electronic transmission. I acknowledge that I have been given the opportunity to request an in-person assessment or other available alternative prior to the telemedicine visit and am voluntarily participating in the telemedicine visit.  I understand that I have the right to withhold or withdraw my consent to the use of telemedicine in the course of my care at any time, without affecting my right to future care or treatment, and that the Practitioner or I may terminate the telemedicine visit at any time. I understand that I have the right to inspect all information obtained and/or recorded in the course of the telemedicine visit and may receive copies of available information for a reasonable fee.  I understand that some of the potential risks of receiving the Services via telemedicine include:  . Delay or interruption in medical evaluation due to technological equipment failure or disruption; . Information transmitted may not be sufficient (e.g. poor resolution of images) to allow for appropriate medical decision making by the Practitioner; and/or  . In rare instances, security protocols could fail, causing a breach of personal health information.  Furthermore, I acknowledge that it is my responsibility to provide information about my medical history, conditions and care that is complete and accurate to the best of my ability. I acknowledge that Practitioner's advice, recommendations, and/or decision may be based on  factors not within their control, such as incomplete or inaccurate data provided by me or distortions of diagnostic images or specimens that may result from electronic transmissions. I understand that the practice of medicine is not an exact science and that Practitioner makes no warranties or guarantees regarding treatment outcomes. I acknowledge that I will receive a copy of this consent concurrently upon execution via email to the email address I last provided but may also request a printed copy by calling the office of [facility].    I understand that my insurance will be billed for this visit.   I have read or had this consent read to me. . I understand the contents of this consent, which adequately explains the benefits and risks of the Services being provided via telemedicine.  . I have been provided ample opportunity to ask questions regarding this consent and the Services and have had my questions answered to my satisfaction. . I give my informed consent for the services to be provided through the use of telemedicine in my medical care  By participating in this telemedicine visit I agree to the above.  

## 2019-05-26 ENCOUNTER — Other Ambulatory Visit: Payer: Medicare Other

## 2019-05-27 ENCOUNTER — Other Ambulatory Visit: Payer: Self-pay | Admitting: Internal Medicine

## 2019-05-27 NOTE — Progress Notes (Signed)
This encounter was created in error - please disregard.

## 2019-05-28 ENCOUNTER — Encounter: Payer: Medicare Other | Admitting: Cardiology

## 2019-05-28 ENCOUNTER — Other Ambulatory Visit: Payer: Self-pay

## 2019-05-28 ENCOUNTER — Telehealth: Payer: Self-pay

## 2019-05-28 NOTE — Telephone Encounter (Signed)
Called patient for hypoglycemia education. Left HIPAA compliant message. Will attempt to call patient next week.  Thomas [name], Sherian Rein D PGY1 Pharmacy Resident  Phone [phone]

## 2019-05-31 ENCOUNTER — Telehealth: Payer: Self-pay

## 2019-05-31 NOTE — Telephone Encounter (Signed)

## 2019-06-02 ENCOUNTER — Telehealth: Payer: Self-pay

## 2019-06-02 ENCOUNTER — Other Ambulatory Visit: Payer: Self-pay

## 2019-06-02 ENCOUNTER — Ambulatory Visit (INDEPENDENT_AMBULATORY_CARE_PROVIDER_SITE_OTHER): Payer: Medicare Other | Admitting: *Deleted

## 2019-06-02 DIAGNOSIS — Z5181 Encounter for therapeutic drug level monitoring: Secondary | ICD-10-CM

## 2019-06-02 DIAGNOSIS — I4891 Unspecified atrial fibrillation: Secondary | ICD-10-CM

## 2019-06-02 LAB — POCT INR: INR: 2.6 (ref 2.0–3.0)

## 2019-06-02 NOTE — Patient Instructions (Signed)
Description   Continue taking 1/2 tablet daily except 1 tablet on Mondays and Saturdays.  Recheck INR in 3 weeks.  Coumadin Clinic [phone] Main [phone]

## 2019-06-03 MED FILL — BIKTARVY 50-200-25 MG TABS: 50-200-25 | 30 days supply | Qty: 30 | Fill #3

## 2019-06-03 NOTE — Telephone Encounter (Signed)
Thomas [name] is a 53 y.o. male who was contacted on behalf of [facility] Geriatrics Task Force.   Diabetes Assessment  DM meds and BS checks -  "What medications are you taking for diabetes?"  Lantus 30 units qhs Humalog 12 units  Tradjenta 5 mg -  "How often do you check your blood sugars at home?"  Yes - 2x daily - "What have your blood sugars been?"  230 and 400s - phone cut out and dropped and patient won't pick up  High Blood Pressure Assessment  BP meds & BP checks -  "What medications are you taking for high blood pressure?"  - "How often do you check your blood pressure at home?"  - "What have your blood pressure readings been?"   Coping with DM and BP - What else are you doing to help with your DM and BP - Diet?  Exercise?   Medication Access Issues  Medication Issues? -  "Are you getting your medicines refilled on time without skipping any doses?"  - "Are you having any problems getting/taking your meds (cost, timing, transportation)?"  - Do you need any meds refilled?   Conclusion  Close the call - Date of follow-up visit scheduled - "Any other questions or concerns?"    Scheduled appointment to speak in person to review meds due to difficulty on the phone

## 2019-06-09 ENCOUNTER — Ambulatory Visit (INDEPENDENT_AMBULATORY_CARE_PROVIDER_SITE_OTHER): Payer: Medicare Other | Admitting: Internal Medicine

## 2019-06-09 ENCOUNTER — Other Ambulatory Visit: Payer: Self-pay

## 2019-06-09 ENCOUNTER — Encounter: Payer: Self-pay | Admitting: Internal Medicine

## 2019-06-09 VITALS — BP 115/70 | HR 85 | Temp 98.0°F

## 2019-06-09 DIAGNOSIS — N186 End stage renal disease: Secondary | ICD-10-CM | POA: Diagnosis not present

## 2019-06-09 DIAGNOSIS — B20 Human immunodeficiency virus [HIV] disease: Secondary | ICD-10-CM | POA: Diagnosis present

## 2019-06-09 DIAGNOSIS — Z992 Dependence on renal dialysis: Secondary | ICD-10-CM | POA: Diagnosis not present

## 2019-06-09 DIAGNOSIS — Z79899 Other long term (current) drug therapy: Secondary | ICD-10-CM

## 2019-06-09 DIAGNOSIS — I200 Unstable angina: Secondary | ICD-10-CM | POA: Diagnosis not present

## 2019-06-09 NOTE — Progress Notes (Signed)
RFV: follow up for hiv disease  Patient ID: Thomas [name], male   DOB: 1966-10-11, 53 y.o.   MRN: [id number]  HPI 53yo M with hiv disease, CD 4 count of 190/VL 480 in march 2020 , on biktarvy - about a year. He reports that he a break off of his meds for getting refills.  " I don't go no where?" except HD center. Has been doing good at getting screened. Wearing masks.   Has right hand first digit/thumb eschar  Outpatient Encounter Medications as of 06/09/2019  Medication Sig  . Accu-Chek FastClix Lancets MISC Check blood sugar 3 times a day  . acetaminophen (TYLENOL) 325 MG tablet Take 650 mg by mouth every 6 (six) hours as needed (for headaches).   Marland Kitchen amiodarone (PACERONE) 200 MG tablet Take 1 tablet (200 mg total) by mouth daily.  Marland Kitchen aspirin 81 MG chewable tablet Chew 1 tablet (81 mg total) by mouth daily.  . bictegravir-emtricitabine-tenofovir AF (BIKTARVY) 50-200-25 MG TABS tablet Take 1 tablet by mouth daily.  . cinacalcet (SENSIPAR) 90 MG tablet Take 90 mg by mouth daily.  . ferric citrate (AURYXIA) 1 GM 210 MG(Fe) tablet Take 420-840 mg by mouth See admin instructions. Take 840 mg by mouth three times a day after meals and 420 mg after each snack  . ferric gluconate 125 mg in sodium chloride 0.9 % 100 mL Inject 125 mg into the vein Every Tuesday,Thursday,and Saturday with dialysis.  Marland Kitchen gabapentin (NEURONTIN) 100 MG capsule Take 1 capsule (100 mg total) by mouth every dialysis.  Marland Kitchen glucose blood (ACCU-CHEK GUIDE) test strip Use as instructed to check blood sugar 3 times a day  . insulin glargine (LANTUS) 100 unit/mL SOPN Inject 0.3 mLs (30 Units total) into the skin at bedtime.  . insulin lispro (HUMALOG) 100 UNIT/ML KiwkPen Please inject 10 units, twice daily, ~10 min before your two largest meals of the day.  . Insulin Pen Needle 32G X 4 MM MISC Use to inject both mealtime and long acting insulins up to 3 injections  a day  . linagliptin (TRADJENTA) 5 MG TABS tablet Take 1 tablet (5  mg total) by mouth daily.  . metoprolol succinate (TOPROL-XL) 100 MG 24 hr tablet TAKE 1 TABLET BY MOUTH ONCE DAILY TAKE WITH OR IMMEDIATELY FOLLOWING A MEAL  . nitroGLYCERIN (NITROSTAT) 0.4 MG SL tablet Place 1 tablet (0.4 mg total) under the tongue every 5 (five) minutes as needed for chest pain.  Marland Kitchen omega-3 acid ethyl esters (LOVAZA) 1 g capsule Take 2 capsules (2 g total) by mouth 2 (two) times daily.  . pantoprazole (PROTONIX) 40 MG tablet Take 2 tablets (80 mg total) by mouth daily.  . polycarbophil (FIBERCON) 625 MG tablet Take 1 tablet (625 mg total) by mouth daily.  . ranolazine (RANEXA) 500 MG 12 hr tablet Take 1 tablet (500 mg total) by mouth 2 (two) times daily.  . rosuvastatin (CRESTOR) 20 MG tablet TAKE 1 TABLET BY MOUTH ONCE DAILY AT 6PM  . Skin Protectants, Misc. (EUCERIN) cream Apply topically as needed for dry skin.  Marland Kitchen warfarin (COUMADIN) 7.5 MG tablet TAKE 1/2 TABLET BY MOUTH DAILY, EXCEPT 1 TABLET ON Monday, Wednesday, AND Saturday OR AS DIRECTED BY ANTICOAGULATION CLINIC. (Patient taking differently: Take 3.75-7.5 mg by mouth at bedtime. Take 3.75 mg by mouth at bedtime on Sun/Tues/Thurs/Fri and 7.5 mg on Mon/Wed/Sat)   No facility-administered encounter medications on file as of 06/09/2019.      Patient Active Problem List  Diagnosis Date Noted  . Obstructive sleep apnea 03/12/2019  . Polypharmacy 03/12/2019  . Transaminitis 03/05/2019  . QT prolongation 02/08/2019  . Lesion of finger 02/08/2019  . Pain and numbness of upper extremity 02/08/2019  . Insomnia 02/08/2019  . Hyperlipidemia LDL goal <70 01/22/2019  . CAD (coronary artery disease), native coronary artery 01/22/2019  . Unstable angina ([place]) 01/22/2019  . Ascending aortic aneurysm ([place]) 12/07/2018  . Noninfectious gastroenteritis 10/27/2018  . 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 Chest pain 10/15/2018  . Non-ST elevation (NSTEMI) myocardial infarction ([place])   . Chest pain with moderate risk for cardiac  etiology 06/15/2018  . Atrial flutter ([place]) 06/15/2018  . Somniloquy 06/12/2018  . Encounter for therapeutic drug monitoring 05/27/2018  . Acute on chronic systolic heart failure ([place])   . Murmur, cardiac 02/13/2018  . Mallory-Weiss tear 01/16/2018  . Morbid obesity ([place])   . Fall   . Subtherapeutic international normalized ratio (INR)   . History of left below knee amputation ([place]) 12/11/2017  . History of supraventricular tachycardia   . S/P bilateral BKA (below knee amputation) ([place])   . Anemia of chronic disease   . Non-ischemic cardiomyopathy ([place])   . Hypokalemia 09/04/2017  . Essential hypertension 09/03/2017  . GERD (gastroesophageal reflux disease) 09/03/2017  . GIB (gastrointestinal bleeding) 07/26/2017  . Anemia due to end stage renal disease ([place]) 07/26/2017  . PAF (paroxysmal atrial fibrillation) ([place]) 07/26/2017  . Symptomatic anemia 07/26/2017  . End stage renal disease ([place]) 05/05/2017  . HIV disease ([place]) 03/25/2017  . 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 ESRD on dialysis ([place]) 03/13/2017  . Type 2 diabetes mellitus with complication ([place]) 42/68/3419     Health Maintenance Due  Topic Date Due  . PNEUMOCOCCAL POLYSACCHARIDE VACCINE AGE 43-64 HIGH RISK  07/14/1968  . FOOT EXAM  07/14/1976  . OPHTHALMOLOGY EXAM  07/14/1976  . URINE MICROALBUMIN  07/14/1976  . TETANUS/TDAP  07/14/1985    Social History   Tobacco Use  . Smoking status: Never Smoker  . Smokeless tobacco: Never Used  Substance Use Topics  . Alcohol use: Never    Frequency: Never  . Drug use: Never    Review of Systems 12 point ros is negative except what is mentioned above Physical Exam   BP 115/70   Pulse 85   Temp 98 F (36.7 C)   Physical Exam  Constitutional: He is oriented to person, place, and time. He appears well-developed and well-nourished. No distress.  HENT:  Mouth/Throat: Oropharynx is clear and moist. No oropharyngeal  exudate.  Cardiovascular: Normal rate, regular rhythm and normal heart sounds. Exam reveals no gallop and no friction rub.  No murmur heard.  Pulmonary/Chest: Effort normal and breath sounds normal. No respiratory distress. He has no wheezes.  Abdominal: Soft. Bowel sounds are normal. He exhibits no distension. There is no tenderness.  Lymphadenopathy:  He has no cervical adenopathy.  Neurological: He is alert and oriented to person, place, and time.  Skin: Skin is warm and dry. No rash noted. No erythema.  Psychiatric: He has a normal mood and affect. His behavior is normal.    Lab Results  Component Value Date   CD4TCELL 48 03/08/2019   Lab Results  Component Value Date   CD4TABS 190 (L) 03/08/2019   CD4TABS 610 02/16/2018   CD4TABS 490 12/09/2017   Lab Results  Component Value Date   HIV1RNAQUANT <20 NOT DETECTED 02/16/2018   Lab Results  Component Value Date   HEPBSAB POS (A) 03/10/2017   Lab Results  Component Value Date   LABRPR NON REAC 03/10/2017    CBC Lab Results  Component Value Date   WBC 9.2 02/24/2019   RBC 3.19 (L) 02/24/2019   HGB 10.3 (L) 02/24/2019   HCT 32.2 (L) 02/24/2019   PLT 172 02/24/2019   MCV 100.9 (H) 02/24/2019   MCH 32.3 02/24/2019   MCHC 32.0 02/24/2019   RDW 17.9 (H) 02/24/2019   LYMPHSABS 1.3 06/04/2018   MONOABS 0.7 06/04/2018   EOSABS 0.3 06/04/2018    BMET Lab Results  Component Value Date   NA 139 03/08/2019   K 3.8 03/08/2019   CL 95 (L) 03/08/2019   CO2 21 03/08/2019   GLUCOSE 99 03/08/2019   BUN 53 (H) 03/08/2019   CREATININE 9.10 (H) 03/08/2019   CALCIUM 10.8 (H) 03/08/2019   GFRNONAA 6 (L) 03/08/2019   GFRAA 7 (L) 03/08/2019      Assessment and Plan  hiv disease = will repeat cd 4 count and viralo load - he reports that he has been taking his medications.acute illness in the spring could account for drop in cd 4 count but remains virologically suppressed.  Peripheral eschar = concern for vascular  compromise causing ischemia. Has upcoming appt with vascular team for evaluation.  ESRD on hemodialysis = biktarvy safe for hd patients,

## 2019-06-10 LAB — T-HELPER CELL (CD4) - (RCID CLINIC ONLY)
CD4 % Helper T Cell: 37 % (ref 33–65)
CD4 T Cell Abs: 479 /uL (ref 400–1790)

## 2019-06-14 ENCOUNTER — Ambulatory Visit: Payer: Medicare Other | Admitting: Pharmacist

## 2019-06-14 ENCOUNTER — Encounter: Payer: Self-pay | Admitting: Surgery

## 2019-06-14 ENCOUNTER — Ambulatory Visit (INDEPENDENT_AMBULATORY_CARE_PROVIDER_SITE_OTHER): Payer: Medicare Other | Admitting: Surgery

## 2019-06-14 ENCOUNTER — Other Ambulatory Visit: Payer: Self-pay

## 2019-06-14 VITALS — BP 108/71 | HR 100 | Temp 97.3°F | Resp 20 | Ht 77.0 in | Wt 254.6 lb

## 2019-06-14 DIAGNOSIS — I200 Unstable angina: Secondary | ICD-10-CM

## 2019-06-14 DIAGNOSIS — N186 End stage renal disease: Secondary | ICD-10-CM

## 2019-06-14 DIAGNOSIS — Z992 Dependence on renal dialysis: Secondary | ICD-10-CM | POA: Diagnosis not present

## 2019-06-14 DIAGNOSIS — E118 Type 2 diabetes mellitus with unspecified complications: Secondary | ICD-10-CM

## 2019-06-14 MED ORDER — RYBELSUS 3 MG PO TABS: 3.0000 mg | ORAL_TABLET | Freq: Every day | ORAL | 0 refills | Status: DC

## 2019-06-14 NOTE — Progress Notes (Signed)
Ruby Dilone is a 53 y.o. male who was contacted on behalf of [facility] Geriatrics Task Force and came in for a visit to go over his medications and discuss low dose Victoza.  Diabetes Assessment  DM meds and BS checks -  "What medications are you taking for diabetes?"  Tradjenta 5 mg Lantus 30 units Humalog 10 units BID -  "How often do you check your blood sugars at home?"  Don't check a lot - "What have your blood sugars been?"  290-320, 189 every once in a while - before and after eating; fingers sore from sticking  High Blood Pressure Assessment  BP meds & BP checks -  "What medications are you taking for high blood pressure?"  Metoprolol succinate 100 mg - "How often do you check your blood pressure at home?"  Yes checking - typically drops at dialysis; checks at home every 4 days. - "What have your blood pressure readings been?" 108/87, 116/94 Dialysis will drop BP to 89/60s, 97/73 Chest hurts, sweating, when BP drops  Coping with DM and BP - What else are you doing to help with your DM and BP - Diet? On a diet because of dialysis - certain foods are contradicting each other; follows list of what can and can't eat due to dialysis and DM - will have a treat every once in awhile; mixed nuts to help with craving - minimizes sweet intake for the week.  Exercise? no  Medication Access Issues  Medication Issues? -  "Are you getting your medicines refilled on time without skipping any doses?" so far so good - doesn't mess around with missing doses - "Are you having any problems getting/taking your meds (cost, timing, transportation)?" No issues - Wife takes him to all appointments - Do you need any meds refilled? Needs refill for insulin (Humalog and Lantus)  Conclusion  Close the call - Date of follow-up visit scheduled - "Any other questions or concerns?" None at the moment.    Also talked to patient about starting cardioprotective medication - patient requested Rybelsus over low dose  Victoza. Will price check and call patient regarding price and administration instructions. Patient requested samples.  Medication Samples have been provided to the patient.  Drug name: Levemir       Strength: 100 units/mL        Qty: 1 pen  LOT: [id number]  Exp.Date: 09/2020  Dosing instructions: Inject 30 units into the skin at night.  The patient has been instructed regarding the correct time, dose, and frequency of taking this medication, including desired effects and most common side effects.   Medication Samples have been provided to the patient.  Drug: Fiasp Strength: 100 units/mL Qty: 1 pen LOT: [id number]  Exp.Date: 09/2020 Dosing instructions: Inject 10 units into the skin twice daily.  The patient has been instructed regarding the correct time, dose, and frequency of taking this medication, including desired effects and most common side effects. Patient advised to contact clinic if any questions arise and verbalized understanding by repeat back.  Romie Levee, PharmD Candidate 2:03 PM 06/14/2019

## 2019-06-14 NOTE — Progress Notes (Signed)
[facility] and [facility] Specialist of [facility]  Patient name: Thomas [name] MRN: [id number] DOB: Apr 22, 1966 Sex: male   REQUESTING PROVIDER:    Dr. Lorrene Reid   REASON FOR CONSULT:    thumb ulcer  HISTORY OF PRESENT ILLNESS:   Thomas [name] is a 53 y.o. male, who is referred today for evaluation of a ulcer on the tip of his right thumb.  This is the same hand as his dialysis access.  His access was placed in [place] approximately 6 years ago.  He has had very little difficulty with his access.  It has been declotted 1 time.  He does state that his hand is numb and cold which is bothersome to him.  He states that he noticed a blood blister on the tip of his thumb about a month ago and it has progressed to a scab and ulcer.  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  He has a history of coronary artery disease.  He is HIV positive.  He is on a statin for hypercholesterolemia.  He does take Coumadin.  PAST MEDICAL HISTORY    Past Medical History:  Diagnosis Date  . Anemia   . Ascending aortic aneurysm ([place])   . CAD (coronary artery disease), native coronary artery 01/22/2019  . Chronic combined systolic and diastolic CHF (congestive heart failure) ([place])   . Diabetic foot ulcer ([place]) 09/03/2017  . Diabetic wet gangrene of the foot ([place]) 09/04/2017  . ESRD (end stage renal disease) on dialysis [facility])    [place] [address], [place], [place] (10/15/2018)  . Headache    "monthly" (06/15/2018)  . HIV disease ([place])   . Hyperlipidemia LDL goal <70 01/22/2019  . Hyperparathyroidism, secondary ([name])   . Hypertension   . Hypertensive heart disease with end stage renal disease on dialysis ([place]) 03/13/2017  . Mallory-Weiss tear 2018  . PAF (paroxysmal atrial fibrillation) ([place])   . Paroxysmal atrial flutter ([place])   . Pneumonia X 1  . QT prolongation   . Subacute osteomyelitis, right ankle and foot ([place])   . SVT (supraventricular tachycardia)  ([place])    ? afib or atrial flutter s/p TEE/DCCV with subsequent ablation due to reoccurrence in [place]  . Type 2 diabetes mellitus ([place])      FAMILY HISTORY   Family History  Problem Relation Age of Onset  . Heart disease Mother   . Diabetes Mother   . Heart disease Father   . Cancer Neg Hx     SOCIAL HISTORY:   Social History   Socioeconomic History  . Marital status: Married    Spouse name: kim  . Number of children: 3  . Years of education: college  . Highest education level: Not on file  Occupational History  . Occupation: disabled  Social Needs  . Financial resource strain: Not on file  . Food insecurity    Worry: Not on file    Inability: Not on file  . Transportation needs    Medical: Not on file    Non-medical: Not on file  Tobacco Use  . Smoking status: Never Smoker  . Smokeless tobacco: Never Used  Substance and Sexual Activity  . Alcohol use: Never    Frequency: Never  . Drug use: Never  . Sexual activity: Not Currently  Lifestyle  . Physical activity    Days per week: Not on file    Minutes per session: Not on file  . Stress: Not on file  Relationships  . Social Herbalist on phone: Not on file    Gets together: Not on file    Attends religious service: Not on file    Active member of club or organization: Not on file    Attends meetings of clubs or organizations: Not on file    Relationship status: Not on file  . Intimate partner violence    Fear of current or ex partner: Not on file    Emotionally abused: Not on file    Physically abused: Not on file    Forced sexual activity: Not on file  Other Topics Concern  . Not on file  Social History Narrative   Lives in [place] with wife.    ALLERGIES:    Allergies  Allergen Reactions  . Oxycodone-Acetaminophen Other (See Comments)    Hallucinations/delirum    CURRENT MEDICATIONS:    Current Outpatient Medications  Medication Sig Dispense Refill  . Accu-Chek FastClix  Lancets MISC Check blood sugar 3 times a day 306 each 3  . acetaminophen (TYLENOL) 325 MG tablet Take 650 mg by mouth every 6 (six) hours as needed (for headaches).     Marland Kitchen amiodarone (PACERONE) 200 MG tablet Take 1 tablet (200 mg total) by mouth daily. 30 tablet 11  . aspirin 81 MG chewable tablet Chew 1 tablet (81 mg total) by mouth daily. 90 tablet 1  . bictegravir-emtricitabine-tenofovir AF (BIKTARVY) 50-200-25 MG TABS tablet Take 1 tablet by mouth daily. 30 tablet 11  . cinacalcet (SENSIPAR) 90 MG tablet Take 90 mg by mouth daily.  6  . ferric citrate (AURYXIA) 1 GM 210 MG(Fe) tablet Take 420-840 mg by mouth See admin instructions. Take 840 mg by mouth three times a day after meals and 420 mg after each snack    . ferric gluconate 125 mg in sodium chloride 0.9 % 100 mL Inject 125 mg into the vein Every Tuesday,Thursday,and Saturday with dialysis.    Marland Kitchen gabapentin (NEURONTIN) 100 MG capsule Take 1 capsule (100 mg total) by mouth every dialysis. 15 capsule 1  . glucose blood (ACCU-CHEK GUIDE) test strip Use as instructed to check blood sugar 3 times a day 300 each 3  . insulin glargine (LANTUS) 100 unit/mL SOPN Inject 0.3 mLs (30 Units total) into the skin at bedtime. 15 mL 11  . insulin lispro (HUMALOG) 100 UNIT/ML KiwkPen Please inject 10 units, twice daily, ~10 min before your two largest meals of the day. 15 mL 11  . Insulin Pen Needle 32G X 4 MM MISC Use to inject both mealtime and long acting insulins up to 3 injections  a day 300 each 3  . linagliptin (TRADJENTA) 5 MG TABS tablet Take 1 tablet (5 mg total) by mouth daily. 90 tablet 1  . metoprolol succinate (TOPROL-XL) 100 MG 24 hr tablet TAKE 1 TABLET BY MOUTH ONCE DAILY TAKE WITH OR IMMEDIATELY FOLLOWING A MEAL 90 tablet 1  . nitroGLYCERIN (NITROSTAT) 0.4 MG SL tablet Place 1 tablet (0.4 mg total) under the tongue every 5 (five) minutes as needed for chest pain. 30 tablet 0  . omega-3 acid ethyl esters (LOVAZA) 1 g capsule Take 2 capsules (2  g total) by mouth 2 (two) times daily. 120 capsule 11  . pantoprazole (PROTONIX) 40 MG tablet Take 2 tablets (80 mg total) by mouth daily. 60 tablet 1  . polycarbophil (FIBERCON) 625 MG tablet Take 1 tablet (625 mg total) by mouth daily. 30 tablet 0  . ranolazine (RANEXA)  500 MG 12 hr tablet Take 1 tablet (500 mg total) by mouth 2 (two) times daily. 60 tablet 0  . rosuvastatin (CRESTOR) 20 MG tablet TAKE 1 TABLET BY MOUTH ONCE DAILY AT 6PM 90 tablet 0  . Skin Protectants, Misc. (EUCERIN) cream Apply topically as needed for dry skin. 454 g 0  . warfarin (COUMADIN) 7.5 MG tablet TAKE 1/2 TABLET BY MOUTH DAILY, EXCEPT 1 TABLET ON Monday, Wednesday, AND Saturday OR AS DIRECTED BY ANTICOAGULATION CLINIC. (Patient taking differently: Take 3.75-7.5 mg by mouth at bedtime. Take 3.75 mg by mouth at bedtime on Sun/Tues/Thurs/Fri and 7.5 mg on Mon/Wed/Sat) 90 tablet 0   No current facility-administered medications for this visit.     REVIEW OF SYSTEMS:   [X]  denotes positive finding, [ ]  denotes negative finding Cardiac  Comments:  Chest pain or chest pressure:    Shortness of breath upon exertion:    Short of breath when lying flat:    Irregular heart rhythm:        Vascular    Pain in calf, thigh, or hip brought on by ambulation:    Pain in feet at night that wakes you up from your sleep:     Blood clot in your veins:    Leg swelling:         Pulmonary    Oxygen at home:    Productive cough:     Wheezing:         Neurologic    Sudden weakness in arms or legs:     Sudden numbness in arms or legs:     Sudden onset of difficulty speaking or slurred speech:    Temporary loss of vision in one eye:     Problems with dizziness:         Gastrointestinal    Blood in stool:      Vomited blood:         Genitourinary    Burning when urinating:     Blood in urine:        Psychiatric    Major depression:         Hematologic    Bleeding problems:    Problems with blood clotting too  easily:        Skin    Rashes or ulcers:        Constitutional    Fever or chills:     PHYSICAL EXAM:   Vitals:   06/14/19 1025  BP: 108/71  Pulse: 100  Resp: 20  Temp: (!) 97.3 F (36.3 C)  SpO2: 95%  Weight: 115.5 kg  Height: 6\' 5"  (1.956 m)    GENERAL: The patient is a well-nourished male, in no acute distress. The vital signs are documented above. CARDIAC: There is a regular rate and rhythm.  VASCULAR: The patient has an excellent thrill within his fistula.  He has a palpable radial pulse.  I was able to get a brisk palmar arch Doppler signal and a digital artery Doppler signal halfway out the affected finger.  All signals did significantly improve with fistula compression PULMONARY: Nonlabored respirations ABDOMEN: Soft and non-tender with normal pitched bowel sounds.  MUSCULOSKELETAL: There are no major deformities or cyanosis. NEUROLOGIC: No focal weakness or paresthesias are detected. SKIN: Ulcerative eschar on tip of right thumb PSYCHIATRIC: The patient has a normal affect.  STUDIES:   None  ASSESSMENT and PLAN   The patient has a ischemic eschar on the tip of his right thumb which is  on the same side as his dialysis access.  He does have Doppler signals halfway of the thumb.  He is going to have difficulty healing this because this is on the same side of his fistula.  He is not interested in having his fistula ligated.  I have recommended that he go see a hand surgeon, which I have referred him to for discussions of options to treat his thumb.  He is agreeable to go visit a hand surgeon for further evaluation.   Leia Alf, MD, FACS [facility] [phone] Pager [phone]

## 2019-06-16 ENCOUNTER — Ambulatory Visit: Payer: Self-pay | Admitting: Orthopedic Surgery

## 2019-06-17 ENCOUNTER — Other Ambulatory Visit: Payer: Self-pay | Admitting: *Deleted

## 2019-06-17 DIAGNOSIS — N186 End stage renal disease: Secondary | ICD-10-CM

## 2019-06-18 ENCOUNTER — Other Ambulatory Visit (HOSPITAL_COMMUNITY): Payer: Medicare Other

## 2019-06-18 ENCOUNTER — Telehealth (HOSPITAL_COMMUNITY): Payer: Self-pay | Admitting: *Deleted

## 2019-06-18 ENCOUNTER — Ambulatory Visit: Payer: Medicare Other

## 2019-06-18 NOTE — Telephone Encounter (Signed)
Attempting to reach pt to confirm appointments

## 2019-06-21 ENCOUNTER — Ambulatory Visit (INDEPENDENT_AMBULATORY_CARE_PROVIDER_SITE_OTHER): Payer: Medicare Other | Admitting: Physician Assistant

## 2019-06-21 ENCOUNTER — Ambulatory Visit (INDEPENDENT_AMBULATORY_CARE_PROVIDER_SITE_OTHER)
Admission: RE | Admit: 2019-06-21 | Discharge: 2019-06-21 | Disposition: A | Discharge status: Home / Self Care | Payer: Medicare Other | Source: Ambulatory Visit | Attending: Surgery | Admitting: Surgery

## 2019-06-21 ENCOUNTER — Other Ambulatory Visit (HOSPITAL_COMMUNITY): Payer: Self-pay | Admitting: Physician Assistant

## 2019-06-21 ENCOUNTER — Other Ambulatory Visit: Payer: Self-pay

## 2019-06-21 ENCOUNTER — Ambulatory Visit (HOSPITAL_COMMUNITY)
Admission: RE | Admit: 2019-06-21 | Discharge: 2019-06-21 | Disposition: A | Discharge status: Home / Self Care | Payer: Medicare Other | Source: Ambulatory Visit | Attending: Family | Admitting: Family

## 2019-06-21 ENCOUNTER — Telehealth: Payer: Self-pay

## 2019-06-21 VITALS — BP 105/70 | HR 80 | Temp 97.7°F | Resp 12 | Ht 77.0 in | Wt 278.9 lb

## 2019-06-21 DIAGNOSIS — N186 End stage renal disease: Secondary | ICD-10-CM

## 2019-06-21 DIAGNOSIS — L989 Disorder of the skin and subcutaneous tissue, unspecified: Secondary | ICD-10-CM | POA: Diagnosis not present

## 2019-06-21 DIAGNOSIS — Z992 Dependence on renal dialysis: Secondary | ICD-10-CM | POA: Insufficient documentation

## 2019-06-21 DIAGNOSIS — I200 Unstable angina: Secondary | ICD-10-CM

## 2019-06-21 NOTE — H&P (View-Only) (Signed)
Established Dialysis Access   History of Present Illness   Thomas [name] is a 53 y.o. ([birth year]) male who returns to clinic to reevaluate right thumb wound.  He was referred to Dr. Fredna Dow for evaluation of ischemic right thumb wound with a radiocephalic fistula on the same arm.  Based on Dr. Levell July office note he has recommended ligation of radiocephalic fistula with the chance that even with restored circulation right thumb may need to be amputated.  Patient would like to try everything necessary to save his right thumb as he is already had bilateral below the knee amputations.  He is agreeable to right radiocephalic fistula ligation, placement of tunneled dialysis catheter, and creation of a new permanent dialysis access in his arm.  He would like this to be done as soon as possible.  Current Outpatient Medications  Medication Sig Dispense Refill  . Accu-Chek FastClix Lancets MISC Check blood sugar 3 times a day 306 each 3  . acetaminophen (TYLENOL) 325 MG tablet Take 650 mg by mouth every 6 (six) hours as needed (for headaches).     Marland Kitchen amiodarone (PACERONE) 200 MG tablet Take 1 tablet (200 mg total) by mouth daily. 30 tablet 11  . aspirin 81 MG chewable tablet Chew 1 tablet (81 mg total) by mouth daily. 90 tablet 1  . bictegravir-emtricitabine-tenofovir AF (BIKTARVY) 50-200-25 MG TABS tablet Take 1 tablet by mouth daily. 30 tablet 11  . cinacalcet (SENSIPAR) 90 MG tablet Take 90 mg by mouth daily.  6  . ferric citrate (AURYXIA) 1 GM 210 MG(Fe) tablet Take 420-840 mg by mouth See admin instructions. Take 840 mg by mouth three times a day after meals and 420 mg after each snack    . ferric gluconate 125 mg in sodium chloride 0.9 % 100 mL Inject 125 mg into the vein Every Tuesday,Thursday,and Saturday with dialysis.    Marland Kitchen gabapentin (NEURONTIN) 100 MG capsule Take 1 capsule (100 mg total) by mouth every dialysis. 15 capsule 1  . glucose blood (ACCU-CHEK GUIDE) test strip Use as instructed to  check blood sugar 3 times a day 300 each 3  . insulin glargine (LANTUS) 100 unit/mL SOPN Inject 0.3 mLs (30 Units total) into the skin at bedtime. 15 mL 11  . insulin lispro (HUMALOG) 100 UNIT/ML KiwkPen Please inject 10 units, twice daily, ~10 min before your two largest meals of the day. 15 mL 11  . Insulin Pen Needle 32G X 4 MM MISC Use to inject both mealtime and long acting insulins up to 3 injections  a day 300 each 3  . linagliptin (TRADJENTA) 5 MG TABS tablet Take 1 tablet (5 mg total) by mouth daily. 90 tablet 1  . metoprolol succinate (TOPROL-XL) 100 MG 24 hr tablet TAKE 1 TABLET BY MOUTH ONCE DAILY TAKE WITH OR IMMEDIATELY FOLLOWING A MEAL 90 tablet 1  . nitroGLYCERIN (NITROSTAT) 0.4 MG SL tablet Place 1 tablet (0.4 mg total) under the tongue every 5 (five) minutes as needed for chest pain. 30 tablet 0  . omega-3 acid ethyl esters (LOVAZA) 1 g capsule Take 2 capsules (2 g total) by mouth 2 (two) times daily. 120 capsule 11  . pantoprazole (PROTONIX) 40 MG tablet Take 2 tablets (80 mg total) by mouth daily. 60 tablet 1  . polycarbophil (FIBERCON) 625 MG tablet Take 1 tablet (625 mg total) by mouth daily. 30 tablet 0  . ranolazine (RANEXA) 500 MG 12 hr tablet Take 1 tablet (500 mg total) by mouth  2 (two) times daily. 60 tablet 0  . rosuvastatin (CRESTOR) 20 MG tablet TAKE 1 TABLET BY MOUTH ONCE DAILY AT 6PM 90 tablet 0  . Semaglutide (RYBELSUS) 3 MG TABS Take 3 mg by mouth daily. 30 tablet 0  . Skin Protectants, Misc. (EUCERIN) cream Apply topically as needed for dry skin. 454 g 0  . warfarin (COUMADIN) 7.5 MG tablet TAKE 1/2 TABLET BY MOUTH DAILY, EXCEPT 1 TABLET ON Monday, Wednesday, AND Saturday OR AS DIRECTED BY ANTICOAGULATION CLINIC. (Patient taking differently: Take 3.75-7.5 mg by mouth at bedtime. Take 3.75 mg by mouth at bedtime on Sun/Tues/Thurs/Fri and 7.5 mg on Mon/Wed/Sat) 90 tablet 0   No current facility-administered medications for this visit.     On ROS today: 10 system  ROS is negative unless otherwise noted in HPI   Physical Examination   Vitals:   06/21/19 1538  BP: 105/70  Pulse: 80  Resp: 12  Temp: 97.7 F (36.5 C)  TempSrc: Temporal  SpO2: 100%  Weight: 278 lb 14.4 oz (126.5 kg)  Height: 6\' 5"  (1.956 m)   Body mass index is 33.07 kg/m.  General Alert, O x 3, WD, NAD  Pulmonary Sym exp, good B air movt, CTA B  Cardiac RRR, Nl S1, S2  Vascular Vessel Right Left  Radial Palpable Palpable  Brachial Palpable Palpable  Ulnar Not palpable Not palpable    Musculo- skeletal  bilateral BKA well-healed; dry gangrenous tip of right thumb  Neurologic A&O; CN grossly intact     Non-invasive Vascular Imaging    LUE Vein Mapping  (06/21/2019):    L arm: acceptable vein conduits include forearm cephalic, upper arm cephalic, upper arm basilic    Medical Decision Making   Thomas [name] is a 53 y.o. male who presents with ESRD requiring hemodialysis.   -Plan will be for ligation of right radiocephalic fistula, placement of tunneled dialysis catheter, and placement of new hemodialysis access in left arm -Patient is aware that even with ligation of fistula he may still require amputation of right thumb \ -Risk, benefits, and alternatives to access surgery were discussed.   -The patient is aware the risks include but are not limited to: bleeding, infection, steal syndrome, nerve damage, thrombosis, failure to mature, and need for additional procedures.   -The patient agrees to proceed with the procedure.    Dagoberto Ligas PA-C [facility] Office: [phone]  Clinic MD: Dr. Trula Slade

## 2019-06-21 NOTE — Telephone Encounter (Signed)
Called to check on patient after starting low dose Rybelsus last week. Patient reports feeling fine with no side effects from the new medication. His sugars have been "okay." Patient reports no sugars less than 70, but has had one sugar reading at 310. Michela Pitcher this could have been due to the sandwich he ate.   Will continue working on trying to get continuous blood glucose monitor for patient so he does not have to prick his finger.  Will discuss increasing dose if patient's sugars are still uncontrolled with provider and Dr. [name].

## 2019-06-21 NOTE — Progress Notes (Signed)
Established Dialysis Access   History of Present Illness   Thomas [name] is a 53 y.o. (11-Jan-1966) male who returns to clinic to reevaluate right thumb wound.  He was referred to Dr. Fredna Dow for evaluation of ischemic right thumb wound with a radiocephalic fistula on the same arm.  Based on Dr. Levell July office note he has recommended ligation of radiocephalic fistula with the chance that even with restored circulation right thumb may need to be amputated.  Patient would like to try everything necessary to save his right thumb as he is already had bilateral below the knee amputations.  He is agreeable to right radiocephalic fistula ligation, placement of tunneled dialysis catheter, and creation of a new permanent dialysis access in his arm.  He would like this to be done as soon as possible.  Current Outpatient Medications  Medication Sig Dispense Refill  . Accu-Chek FastClix Lancets MISC Check blood sugar 3 times a day 306 each 3  . acetaminophen (TYLENOL) 325 MG tablet Take 650 mg by mouth every 6 (six) hours as needed (for headaches).     Marland Kitchen amiodarone (PACERONE) 200 MG tablet Take 1 tablet (200 mg total) by mouth daily. 30 tablet 11  . aspirin 81 MG chewable tablet Chew 1 tablet (81 mg total) by mouth daily. 90 tablet 1  . bictegravir-emtricitabine-tenofovir AF (BIKTARVY) 50-200-25 MG TABS tablet Take 1 tablet by mouth daily. 30 tablet 11  . cinacalcet (SENSIPAR) 90 MG tablet Take 90 mg by mouth daily.  6  . ferric citrate (AURYXIA) 1 GM 210 MG(Fe) tablet Take 420-840 mg by mouth See admin instructions. Take 840 mg by mouth three times a day after meals and 420 mg after each snack    . ferric gluconate 125 mg in sodium chloride 0.9 % 100 mL Inject 125 mg into the vein Every Tuesday,Thursday,and Saturday with dialysis.    Marland Kitchen gabapentin (NEURONTIN) 100 MG capsule Take 1 capsule (100 mg total) by mouth every dialysis. 15 capsule 1  . glucose blood (ACCU-CHEK GUIDE) test strip Use as instructed to  check blood sugar 3 times a day 300 each 3  . insulin glargine (LANTUS) 100 unit/mL SOPN Inject 0.3 mLs (30 Units total) into the skin at bedtime. 15 mL 11  . insulin lispro (HUMALOG) 100 UNIT/ML KiwkPen Please inject 10 units, twice daily, ~10 min before your two largest meals of the day. 15 mL 11  . Insulin Pen Needle 32G X 4 MM MISC Use to inject both mealtime and long acting insulins up to 3 injections  a day 300 each 3  . linagliptin (TRADJENTA) 5 MG TABS tablet Take 1 tablet (5 mg total) by mouth daily. 90 tablet 1  . metoprolol succinate (TOPROL-XL) 100 MG 24 hr tablet TAKE 1 TABLET BY MOUTH ONCE DAILY TAKE WITH OR IMMEDIATELY FOLLOWING A MEAL 90 tablet 1  . nitroGLYCERIN (NITROSTAT) 0.4 MG SL tablet Place 1 tablet (0.4 mg total) under the tongue every 5 (five) minutes as needed for chest pain. 30 tablet 0  . omega-3 acid ethyl esters (LOVAZA) 1 g capsule Take 2 capsules (2 g total) by mouth 2 (two) times daily. 120 capsule 11  . pantoprazole (PROTONIX) 40 MG tablet Take 2 tablets (80 mg total) by mouth daily. 60 tablet 1  . polycarbophil (FIBERCON) 625 MG tablet Take 1 tablet (625 mg total) by mouth daily. 30 tablet 0  . ranolazine (RANEXA) 500 MG 12 hr tablet Take 1 tablet (500 mg total) by mouth  2 (two) times daily. 60 tablet 0  . rosuvastatin (CRESTOR) 20 MG tablet TAKE 1 TABLET BY MOUTH ONCE DAILY AT 6PM 90 tablet 0  . Semaglutide (RYBELSUS) 3 MG TABS Take 3 mg by mouth daily. 30 tablet 0  . Skin Protectants, Misc. (EUCERIN) cream Apply topically as needed for dry skin. 454 g 0  . warfarin (COUMADIN) 7.5 MG tablet TAKE 1/2 TABLET BY MOUTH DAILY, EXCEPT 1 TABLET ON Monday, Wednesday, AND Saturday OR AS DIRECTED BY ANTICOAGULATION CLINIC. (Patient taking differently: Take 3.75-7.5 mg by mouth at bedtime. Take 3.75 mg by mouth at bedtime on Sun/Tues/Thurs/Fri and 7.5 mg on Mon/Wed/Sat) 90 tablet 0   No current facility-administered medications for this visit.     On ROS today: 10 system  ROS is negative unless otherwise noted in HPI   Physical Examination   Vitals:   06/21/19 1538  BP: 105/70  Pulse: 80  Resp: 12  Temp: 97.7 F (36.5 C)  TempSrc: Temporal  SpO2: 100%  Weight: 278 lb 14.4 oz (126.5 kg)  Height: 6\' 5"  (1.956 m)   Body mass index is 33.07 kg/m.  General Alert, O x 3, WD, NAD  Pulmonary Sym exp, good B air movt, CTA B  Cardiac RRR, Nl S1, S2  Vascular Vessel Right Left  Radial Palpable Palpable  Brachial Palpable Palpable  Ulnar Not palpable Not palpable    Musculo- skeletal  bilateral BKA well-healed; dry gangrenous tip of right thumb  Neurologic A&O; CN grossly intact     Non-invasive Vascular Imaging    LUE Vein Mapping  (06/21/2019):    L arm: acceptable vein conduits include forearm cephalic, upper arm cephalic, upper arm basilic    Medical Decision Making   Thomas [name] is a 53 y.o. male who presents with ESRD requiring hemodialysis.   -Plan will be for ligation of right radiocephalic fistula, placement of tunneled dialysis catheter, and placement of new hemodialysis access in left arm -Patient is aware that even with ligation of fistula he may still require amputation of right thumb \ -Risk, benefits, and alternatives to access surgery were discussed.   -The patient is aware the risks include but are not limited to: bleeding, infection, steal syndrome, nerve damage, thrombosis, failure to mature, and need for additional procedures.   -The patient agrees to proceed with the procedure.    Dagoberto Ligas PA-C [facility] Office: [phone]  Clinic MD: Dr. Trula Slade

## 2019-06-22 ENCOUNTER — Encounter: Payer: Self-pay | Admitting: *Deleted

## 2019-06-22 ENCOUNTER — Other Ambulatory Visit: Payer: Self-pay | Admitting: *Deleted

## 2019-06-22 ENCOUNTER — Telehealth: Payer: Self-pay | Admitting: *Deleted

## 2019-06-22 NOTE — Progress Notes (Signed)
Call to patient to schedule surgery. Instructed to be at [facility] admitting at 8:30 am on 06/25/2019. Instructed to hold Coumadin starting today for Friday's surgery. All instructions per letter faxed to [facility]. For patient. To call this office if questions. Confirmed with Tiffany they received instructions and will give to patient today.

## 2019-06-22 NOTE — Telephone Encounter (Signed)
Patient with diagnosis of afib on warfarin for anticoagulation.    Procedure:  Ligation of right arm A/V fistula , insertion of TDC, Creation of fistula left arm. Date of procedure: 06/25/2019  CHADS2-VASc score of  4 (CHF, HTN, AGE, DM2, stroke/tia x 2, CAD, AGE, male)  Per office protocol, patient can hold warfarin for 3 days prior to procedure.

## 2019-06-22 NOTE — Telephone Encounter (Addendum)
   Primary Cardiologist: Fransico Him, MD  Called patient to assess how he is doing and he says he is fine without any new cardiac complaints. Given past medical history and time since last visit, based on ACC/AHA guidelines, Thomas [name] would be at acceptable risk for the planned procedure without further cardiovascular testing. (Fistula surgery is medically necessary and should be generally low risk under MAC.) Chart reviewed by pharmacist. I relayed their input to patient that, "per office protocol, patient can hold warfarin for 3 days prior to procedure." Patient had a somewhat reserved affect on the phone so may benefit from f/u call from vascular team to make him aware as well.  I will route this recommendation to the requesting party via Epic and remove from pre-op pool.  Please call with questions.  Charlie Pitter, PA-C 06/22/2019, 12:49 PM

## 2019-06-22 NOTE — Telephone Encounter (Signed)
Request for Surgical Clearance  1. What type of surgery is being performed?  Ligation of right arm A/V fistula , insertion of TDC, Creation of fistula left arm.    2. When is this surgery scheduled? 06/25/2019  3. What type of clearance is required (medical clearance vs. Pharmacy clearance to hold med vs. Both)? PHARMACY CLEARANCE FOR COUMADIN HOLD    4. Are there any medications that need to be held prior to surgery and how long?  COUMADIN  HOLD FOR 3 DAYS PRE-OP    5. Practice name and name of physician performing surgery?   [facility]/ DR. Trula Slade     6.  What is your office phone number? [phone]    7. What is your office fax number? (Be sure to include anyone who it needs to go Attn to) [phone] ATTN: [email or web address]    8. Anesthesia type (None, local, MAC, general)? MAC   REMINDER TO USER: Remember to please route this message to P [facility] DIV PREOP in a phone note.

## 2019-06-22 NOTE — Telephone Encounter (Signed)
   Primary Cardiologist: Fransico Him, MD  Chart reviewed as part of pre-operative protocol coverage. Patient was contacted 06/22/2019 in reference to pre-operative risk assessment for pending surgery as outlined below.  Thomas [name] was last seen on 03/03/19 by Ermalinda Barrios, PA-C. Thomas [name] has history of CAD (managed medically per cath 12/2018), atrial fib/atrial flutter, dilated aorta, chronic combined CHF, ESRD on HD, poorly controlled DM, HLD. Procedure clearly appears medically necessary and under MAC so generally low risk. Clearance is to address coumadin. Will route to pharm for input ASAP.   Charlie Pitter, PA-C 06/22/2019, 11:58 AM

## 2019-06-23 ENCOUNTER — Encounter: Payer: Self-pay | Admitting: Pharmacist Clinician (PhC)/ Clinical Pharmacy Specialist

## 2019-06-23 NOTE — Progress Notes (Signed)
This encounter was created in error - please disregard.

## 2019-06-24 ENCOUNTER — Encounter (HOSPITAL_COMMUNITY): Payer: Self-pay | Admitting: *Deleted

## 2019-06-24 ENCOUNTER — Other Ambulatory Visit: Payer: Self-pay

## 2019-06-24 ENCOUNTER — Other Ambulatory Visit (HOSPITAL_COMMUNITY)
Admission: RE | Admit: 2019-06-24 | Discharge: 2019-06-24 | Disposition: A | Discharge status: Home / Self Care | Payer: Medicare Other | Source: Ambulatory Visit | Attending: Surgery | Admitting: Surgery

## 2019-06-24 DIAGNOSIS — Z1159 Encounter for screening for other viral diseases: Secondary | ICD-10-CM | POA: Insufficient documentation

## 2019-06-24 LAB — SARS CORONAVIRUS 2 (TAT 6-24 HRS): SARS Coronavirus 2: NEGATIVE

## 2019-06-24 MED ORDER — DEXTROSE 5 % IV SOLN
3.0000 g | INTRAVENOUS | Status: AC
  Administered 2019-06-25: 3 g via INTRAVENOUS
  Filled 2019-06-24: qty 3

## 2019-06-24 NOTE — Progress Notes (Signed)
Pt denies SOB and chest pain. Pt under the care of Dr. Fransico Him, Cardiology. Pt denies having a stress test. Pt made aware to stop taking vitamins, fish oil (Lovaza) and herbal medications. Do not take any NSAIDs ie: Ibuprofen, Advil, Naproxen (Aleve), Motrin, BC and Goody Powder. Pt stated that his last dose of Coumadin was "Tuesday."   Pt made aware to take 15 units of Lantus at HS and no insulin DOS and no Semaglutide (RYBELSUS) DOS. Pt made aware to check BG every 2 hours prior to arrival to hospital on DOS. Pt made aware to treat a BG < 70 with  4 ounces of apple or cranberry juice, wait 15 minutes after intervention to recheck BG, if BG remains < 70, call Short Stay unit to speak with a nurse.  Pt denies that he and family members tested positive for COVID-19 ( pt results pending and pt reminded to quarantine).   Pt denies that he and family members experienced the following symptoms:  Cough yes/no: No Fever (>100.3F)  yes/no: No Runny nose yes/no: No Sore throat yes/no: No Difficulty breathing/shortness of breath  yes/no: No  Have you or a family member traveled in the last 14 days and where? yes/no: No  Pt reminded that hospital visitation restrictions are in effect and the importance of the restrictions .  Pt verbalized understanding of all pre-op instructions.  See note from Willeen Cass, NP, Anesthesiology.

## 2019-06-24 NOTE — Progress Notes (Signed)
Anesthesia Chart Review:  Pt is a same day work up   Case: [id number] Date/Time: 06/25/19 1145   Procedures:      LIGATION OF ARTERIOVENOUS  FISTULA RIGHT ARM (Right )     ARTERIOVENOUS (AV) FISTULA CREATION VERSUS INSERTION OF ARTERIOVENOUS GRAFT LEFT ARM (Left )     INSERTION OF TUNNELED  DIALYSIS CATHETER (N/A )   Anesthesia type: Monitor Anesthesia Care   Pre-op diagnosis: END STAGE RENAL DISEASE FOR HEMODIALYSIS ACCESS   Location: [facility] OR ROOM 66 / [place] OR   Surgeon: Serafina [name], MD      DISCUSSION:  - Pt is a 53 year old male with hx CAD (severe 2 vessel disease managed medically), NICM, acute on chronic CHF, PAF, NSTEMI,  HTN, ascending aortic aneurysm (mildly dilated by 12/2018 echo), OSA, DM, ESRD on hemodialysis, HIV, s/p B BKA   - Pt to hold coumadin 3 days prior to surgery.    PROVIDERS: - PCP is [name], MD - Cardiologist is Fransico Him, MD. Last office visit 03/03/19 with Ermalinda Barrios, PA. Pt cleared for procedure 06/22/19 by Melina Copa, PA.  - ID is Carlyle Basques, MD   LABS: Will be obtained day of surgery    IMAGES:  1 view CXR 02/21/19:    EKG 03/03/19: sinus rhythm with 1st degree AV block, R superior axis deviation, Nonspecific intraventricular delay, nonspecific ST and T wave abnormality, prolonged QT.    CV:  Cardiac cath 01/25/19:  1. Severe 2 vessel obstructive CAD    - 80% mid LAD    - 99% distal LAD and third diagonal    - 100% first diagonal    - 100% first PLOM, 95% ongoing PL branch    - 90% RV marginal branch. 2. Mildly elevated LVEDP 24 mm Hg. 3. No significant change to June 2019. Medical management recommended.   Echo 01/24/19:  - Left ventricle: The cavity size was mildly dilated. Wall thickness was increased in a pattern of severe LVH. Systolic function was moderately reduced. The estimated ejection fraction was in the range of 35% to 40%. Diffuse hypokinesis. Doppler parameters are consistent with restrictive physiology,  indicative of decreased left ventricular diastolic compliance and/or increased left atrial pressure. Doppler parameters are consistent with high ventricular filling pressure. - Aortic valve: There was mild regurgitation. - Aortic root: The aortic root was mildly dilated. - Ascending aorta: The ascending aorta was mildly dilated. - Mitral valve: Calcified annulus. There was mild regurgitation. - Left atrium: The atrium was severely dilated. - Pulmonary arteries: Systolic pressure was mildly increased. PA peak pressure: 42 mm Hg (S). - Impressions: Moderate global reduction in LV systolic function (EF 40); severe LVH; restrictive filling; mild LVE; mild AI; mildly dilated aortic root/ascending aorta; mild MR; severe LAE; mild TR with mild pulmonary hypertension.   Past Medical History:  Diagnosis Date  . Anemia   . Ascending aortic aneurysm ([place])   . CAD (coronary artery disease), native coronary artery 01/22/2019  . Chronic combined systolic and diastolic CHF (congestive heart failure) ([place])   . Diabetic foot ulcer ([place]) 09/03/2017  . Diabetic wet gangrene of the foot ([place]) 09/04/2017  . ESRD (end stage renal disease) on dialysis [facility])    [place] [address] [place], [place], [place] (10/15/2018)  . Headache    "monthly" (06/15/2018)  . HIV disease ([place])   . Hyperlipidemia LDL goal <70 01/22/2019  . Hyperparathyroidism, secondary ([place])   . Hypertension   . Hypertensive heart disease  with end stage renal disease on dialysis ([place]) 03/13/2017  . Mallory-Weiss tear 2018  . PAF (paroxysmal atrial fibrillation) ([place])   . Paroxysmal atrial flutter ([place])   . Pneumonia X 1  . QT prolongation   . Subacute osteomyelitis, right ankle and foot ([place])   . SVT (supraventricular tachycardia) ([place])    ? afib or atrial flutter s/p TEE/DCCV with subsequent ablation due to reoccurrence in [place]  . Type 2 diabetes mellitus ([place])     Past Surgical History:  Procedure Laterality Date  . AMPUTATION Left    foot  . AMPUTATION  Right 12/06/2017   Procedure: AMPUTATION BELOW KNEE;  Surgeon: [name], MD;  Location: [place];  Service: Orthopedics;  Laterality: Right;  . APPLICATION OF WOUND VAC Right 09/04/2017   Procedure: APPLICATION OF WOUND VAC;  Surgeon: [name], DPM;  Location: [place];  Service: Podiatry;  Laterality: Right;  . APPLICATION OF WOUND VAC  12/06/2017   Procedure: APPLICATION OF WOUND VAC;  Surgeon: [name], MD;  Location: [place];  Service: Orthopedics;;  . AV FISTULA PLACEMENT Right 10/19/2012  . AV FISTULA REPAIR Right ~ 02/2018   "had it cleaned out"  . BELOW KNEE LEG AMPUTATION Left ~ 2016  . COLONOSCOPY WITH PROPOFOL N/A 07/30/2017   Procedure: COLONOSCOPY WITH PROPOFOL;  Surgeon: [name], MD;  Location: [place];  Service: Endoscopy;  Laterality: N/A;  . ESOPHAGOGASTRODUODENOSCOPY (EGD) WITH PROPOFOL N/A 07/28/2017   Procedure: ESOPHAGOGASTRODUODENOSCOPY (EGD) WITH PROPOFOL;  Surgeon: [name], MD;  Location: [place];  Service: Endoscopy;  Laterality: N/A;  . ESOPHAGOGASTRODUODENOSCOPY (EGD) WITH PROPOFOL N/A 01/15/2018   Procedure: ESOPHAGOGASTRODUODENOSCOPY (EGD) WITH PROPOFOL;  Surgeon: [name], MD;  Location: [facility] ENDOSCOPY;  Service: Endoscopy;  Laterality: N/A;  . FLEXIBLE SIGMOIDOSCOPY N/A 07/27/2017   Procedure: FLEXIBLE SIGMOIDOSCOPY;  Surgeon: [name], MD;  Location: [facility] ENDOSCOPY;  Service: Endoscopy;  Laterality: N/A;  . GRAFT APPLICATION Right 16/09/9603   Procedure: SKIN GRAFT APPLICATION RIGHT FOOT;  Surgeon: [name], DPM;  Location: [place];  Service: Podiatry;  Laterality: Right;  . I&D EXTREMITY Right 09/04/2017   Procedure: IRRIGATION AND DEBRIDEMENT EXTREMITY;  Surgeon: [name], DPM;  Location: [place];  Service: Podiatry;  Laterality: Right;  . I&D EXTREMITY Right 11/12/2017   Procedure: IRRIGATION AND DEBRIDEMENT ULCER RIGHT FOOT;  Surgeon: [name], DPM;  Location: [place];  Service: Podiatry;  Laterality:  Right;  . LEFT HEART CATH AND CORONARY ANGIOGRAPHY N/A 06/18/2018   Procedure: LEFT HEART CATH AND CORONARY ANGIOGRAPHY;  Surgeon: [name], Peter M, MD;  Location: [place] CV LAB;  Service: Cardiovascular;  Laterality: N/A;  . LEFT HEART CATH AND CORONARY ANGIOGRAPHY N/A 01/25/2019   Procedure: LEFT HEART CATH AND CORONARY ANGIOGRAPHY;  Surgeon: [name], Peter M, MD;  Location: [place] CV LAB;  Service: Cardiovascular;  Laterality: N/A;  . WOUND DEBRIDEMENT N/A 09/24/2017   Procedure: DEBRIDEMENT WOUND;  Surgeon: [name], DPM;  Location: [place];  Service: Podiatry;  Laterality: N/A;    MEDICATIONS: . [START ON 06/25/2019] ceFAZolin (ANCEF) 3 g in dextrose 5 % 50 mL IVPB   . acetaminophen (TYLENOL) 325 MG tablet  . amiodarone (PACERONE) 200 MG tablet  . aspirin 81 MG chewable tablet  . bictegravir-emtricitabine-tenofovir AF (BIKTARVY) 50-200-25 MG TABS tablet  . cinacalcet (SENSIPAR) 90 MG tablet  . doxycycline (VIBRAMYCIN) 100 MG capsule  . ferric citrate (AURYXIA) 1 GM 210 MG(Fe) tablet  . insulin  glargine (LANTUS) 100 unit/mL SOPN  . insulin lispro (HUMALOG) 100 UNIT/ML KiwkPen  . metoprolol succinate (TOPROL-XL) 100 MG 24 hr tablet  . nitroGLYCERIN (NITROSTAT) 0.4 MG SL tablet  . omega-3 acid ethyl esters (LOVAZA) 1 g capsule  . ranolazine (RANEXA) 500 MG 12 hr tablet  . rosuvastatin (CRESTOR) 20 MG tablet  . Semaglutide (RYBELSUS) 3 MG TABS  . Skin Protectants, Misc. (EUCERIN) cream  . warfarin (COUMADIN) 7.5 MG tablet  . Accu-Chek FastClix Lancets MISC  . ferric gluconate 125 mg in sodium chloride 0.9 % 100 mL  . gabapentin (NEURONTIN) 100 MG capsule  . glucose blood (ACCU-CHEK GUIDE) test strip  . Insulin Pen Needle 32G X 4 MM MISC  . linagliptin (TRADJENTA) 5 MG TABS tablet  . pantoprazole (PROTONIX) 40 MG tablet  . polycarbophil (FIBERCON) 625 MG tablet    If labs acceptable day of surgery, I anticipate pt can proceed with surgery as scheduled.  Willeen Cass, FNP-BC [facility]/Anesthesiology Phone: [phone] 06/24/2019 2:59 PM

## 2019-06-24 NOTE — Progress Notes (Signed)
   06/24/19 1746  OBSTRUCTIVE SLEEP APNEA  Have you ever been diagnosed with sleep apnea through a sleep study? No  Do you snore loudly (loud enough to be heard through closed doors)?  0  Do you often feel tired, fatigued, or sleepy during the daytime (such as falling asleep during driving or talking to someone)? 0  Has anyone observed you stop breathing during your sleep? 1  Do you have, or are you being treated for high blood pressure? 1  BMI more than 35 kg/m2? 0  Age > 50 (1-yes) 1  Neck circumference greater than:Male 16 inches or larger, Male 17inches or larger? 1  Male Gender (Yes=1) 1  Obstructive Sleep Apnea Score 5

## 2019-06-25 ENCOUNTER — Ambulatory Visit (HOSPITAL_COMMUNITY): Payer: Medicare Other

## 2019-06-25 ENCOUNTER — Ambulatory Visit (HOSPITAL_COMMUNITY)
Admission: RE | Admit: 2019-06-25 | Discharge: 2019-06-25 | Disposition: A | Discharge status: Home / Self Care | Payer: Medicare Other | Attending: Surgery | Admitting: Surgery

## 2019-06-25 ENCOUNTER — Encounter (HOSPITAL_COMMUNITY)
Admission: RE | Disposition: A | Discharge status: Home / Self Care | Payer: Self-pay | Source: Home / Self Care | Attending: Surgery

## 2019-06-25 ENCOUNTER — Ambulatory Visit (HOSPITAL_COMMUNITY): Payer: Medicare Other | Admitting: Emergency Medicine

## 2019-06-25 ENCOUNTER — Other Ambulatory Visit: Payer: Self-pay

## 2019-06-25 DIAGNOSIS — Z7901 Long term (current) use of anticoagulants: Secondary | ICD-10-CM | POA: Diagnosis not present

## 2019-06-25 DIAGNOSIS — I132 Hypertensive heart and chronic kidney disease with heart failure and with stage 5 chronic kidney disease, or end stage renal disease: Secondary | ICD-10-CM | POA: Diagnosis not present

## 2019-06-25 DIAGNOSIS — I252 Old myocardial infarction: Secondary | ICD-10-CM | POA: Insufficient documentation

## 2019-06-25 DIAGNOSIS — N2581 Secondary hyperparathyroidism of renal origin: Secondary | ICD-10-CM | POA: Diagnosis not present

## 2019-06-25 DIAGNOSIS — X58XXXA Exposure to other specified factors, initial encounter: Secondary | ICD-10-CM | POA: Diagnosis not present

## 2019-06-25 DIAGNOSIS — Z794 Long term (current) use of insulin: Secondary | ICD-10-CM | POA: Diagnosis not present

## 2019-06-25 DIAGNOSIS — I272 Pulmonary hypertension, unspecified: Secondary | ICD-10-CM | POA: Insufficient documentation

## 2019-06-25 DIAGNOSIS — Z992 Dependence on renal dialysis: Secondary | ICD-10-CM | POA: Insufficient documentation

## 2019-06-25 DIAGNOSIS — L989 Disorder of the skin and subcutaneous tissue, unspecified: Secondary | ICD-10-CM

## 2019-06-25 DIAGNOSIS — I712 Thoracic aortic aneurysm, without rupture: Secondary | ICD-10-CM | POA: Insufficient documentation

## 2019-06-25 DIAGNOSIS — I700 Atherosclerosis of aorta: Secondary | ICD-10-CM | POA: Diagnosis not present

## 2019-06-25 DIAGNOSIS — Z21 Asymptomatic human immunodeficiency virus [HIV] infection status: Secondary | ICD-10-CM | POA: Diagnosis not present

## 2019-06-25 DIAGNOSIS — K219 Gastro-esophageal reflux disease without esophagitis: Secondary | ICD-10-CM | POA: Insufficient documentation

## 2019-06-25 DIAGNOSIS — L98499 Non-pressure chronic ulcer of skin of other sites with unspecified severity: Secondary | ICD-10-CM | POA: Insufficient documentation

## 2019-06-25 DIAGNOSIS — E1122 Type 2 diabetes mellitus with diabetic chronic kidney disease: Secondary | ICD-10-CM | POA: Insufficient documentation

## 2019-06-25 DIAGNOSIS — Z8719 Personal history of other diseases of the digestive system: Secondary | ICD-10-CM | POA: Insufficient documentation

## 2019-06-25 DIAGNOSIS — N186 End stage renal disease: Secondary | ICD-10-CM

## 2019-06-25 DIAGNOSIS — I5042 Chronic combined systolic (congestive) and diastolic (congestive) heart failure: Secondary | ICD-10-CM | POA: Diagnosis not present

## 2019-06-25 DIAGNOSIS — Z885 Allergy status to narcotic agent status: Secondary | ICD-10-CM | POA: Insufficient documentation

## 2019-06-25 DIAGNOSIS — Z7982 Long term (current) use of aspirin: Secondary | ICD-10-CM | POA: Insufficient documentation

## 2019-06-25 DIAGNOSIS — T82898A Other specified complication of vascular prosthetic devices, implants and grafts, initial encounter: Secondary | ICD-10-CM | POA: Diagnosis present

## 2019-06-25 DIAGNOSIS — Z419 Encounter for procedure for purposes other than remedying health state, unspecified: Secondary | ICD-10-CM

## 2019-06-25 DIAGNOSIS — E785 Hyperlipidemia, unspecified: Secondary | ICD-10-CM | POA: Diagnosis not present

## 2019-06-25 DIAGNOSIS — Z79899 Other long term (current) drug therapy: Secondary | ICD-10-CM | POA: Insufficient documentation

## 2019-06-25 DIAGNOSIS — J90 Pleural effusion, not elsewhere classified: Secondary | ICD-10-CM | POA: Insufficient documentation

## 2019-06-25 DIAGNOSIS — I2510 Atherosclerotic heart disease of native coronary artery without angina pectoris: Secondary | ICD-10-CM | POA: Insufficient documentation

## 2019-06-25 DIAGNOSIS — Z833 Family history of diabetes mellitus: Secondary | ICD-10-CM | POA: Insufficient documentation

## 2019-06-25 DIAGNOSIS — I4891 Unspecified atrial fibrillation: Secondary | ICD-10-CM | POA: Diagnosis not present

## 2019-06-25 DIAGNOSIS — E1151 Type 2 diabetes mellitus with diabetic peripheral angiopathy without gangrene: Secondary | ICD-10-CM | POA: Diagnosis not present

## 2019-06-25 DIAGNOSIS — I480 Paroxysmal atrial fibrillation: Secondary | ICD-10-CM | POA: Diagnosis not present

## 2019-06-25 DIAGNOSIS — E11622 Type 2 diabetes mellitus with other skin ulcer: Secondary | ICD-10-CM | POA: Diagnosis not present

## 2019-06-25 DIAGNOSIS — Z8249 Family history of ischemic heart disease and other diseases of the circulatory system: Secondary | ICD-10-CM | POA: Insufficient documentation

## 2019-06-25 HISTORY — PX: LIGATION OF ARTERIOVENOUS  FISTULA: SHX5948

## 2019-06-25 HISTORY — PX: AV FISTULA PLACEMENT: SHX1204

## 2019-06-25 HISTORY — PX: INSERTION OF DIALYSIS CATHETER: SHX1324

## 2019-06-25 HISTORY — DX: Presence of spectacles and contact lenses: Z97.3

## 2019-06-25 LAB — GLUCOSE, CAPILLARY
Glucose-Capillary: 215 mg/dL — ABNORMAL HIGH (ref 70–99)
Glucose-Capillary: 139 mg/dL — ABNORMAL HIGH (ref 70–99)

## 2019-06-25 LAB — POCT I-STAT 4, (NA,K, GLUC, HGB,HCT)
Glucose, Bld: 198 mg/dL — ABNORMAL HIGH (ref 70–99)
HCT: 43 % (ref 39.0–52.0)
Hemoglobin: 14.6 g/dL (ref 13.0–17.0)
Potassium: 3.3 mmol/L — ABNORMAL LOW (ref 3.5–5.1)
Sodium: 137 mmol/L (ref 135–145)

## 2019-06-25 SURGERY — INSERTION OF DIALYSIS CATHETER
Anesthesia: General | Site: Chest | Laterality: Right

## 2019-06-25 MED ORDER — EPHEDRINE SULFATE 50 MG/ML IJ SOLN
INTRAMUSCULAR | Status: DC | PRN
  Administered 2019-06-25 (×2): 10 mg via INTRAVENOUS

## 2019-06-25 MED ORDER — LIDOCAINE HCL (CARDIAC) PF 100 MG/5ML IV SOSY
PREFILLED_SYRINGE | INTRAVENOUS | Status: DC | PRN
  Administered 2019-06-25: 60 mg via INTRAVENOUS

## 2019-06-25 MED ORDER — OXYCODONE HCL 5 MG PO TABS: 5.0000 mg | ORAL_TABLET | ORAL | 0 refills | Status: DC | PRN

## 2019-06-25 MED ORDER — SODIUM CHLORIDE 0.9 % IV SOLN
INTRAVENOUS | Status: AC
  Filled 2019-06-25: qty 1.2

## 2019-06-25 MED ORDER — MIDAZOLAM HCL 2 MG/2ML IJ SOLN
INTRAMUSCULAR | Status: AC
  Filled 2019-06-25: qty 2

## 2019-06-25 MED ORDER — PROPOFOL 10 MG/ML IV BOLUS
INTRAVENOUS | Status: AC
  Filled 2019-06-25: qty 40

## 2019-06-25 MED ORDER — PROPOFOL 10 MG/ML IV BOLUS
INTRAVENOUS | Status: DC | PRN
  Administered 2019-06-25: 200 mg via INTRAVENOUS

## 2019-06-25 MED ORDER — SODIUM CHLORIDE 0.9 % IV SOLN
INTRAVENOUS | Status: DC
  Administered 2019-06-25 (×2): via INTRAVENOUS

## 2019-06-25 MED ORDER — FENTANYL CITRATE (PF) 250 MCG/5ML IJ SOLN
INTRAMUSCULAR | Status: AC
  Filled 2019-06-25: qty 5

## 2019-06-25 MED ORDER — CHLORHEXIDINE GLUCONATE 4 % EX LIQD: 60.0000 mL | Freq: Once | CUTANEOUS | Status: DC

## 2019-06-25 MED ORDER — PAPAVERINE HCL 30 MG/ML IJ SOLN
INTRAMUSCULAR | Status: AC
  Filled 2019-06-25: qty 2

## 2019-06-25 MED ORDER — LIDOCAINE HCL (PF) 1 % IJ SOLN
INTRAMUSCULAR | Status: AC
  Filled 2019-06-25: qty 30

## 2019-06-25 MED ORDER — 0.9 % SODIUM CHLORIDE (POUR BTL) OPTIME
TOPICAL | Status: DC | PRN
  Administered 2019-06-25: 1000 mL

## 2019-06-25 MED ORDER — PROTAMINE SULFATE 10 MG/ML IV SOLN
INTRAVENOUS | Status: DC | PRN
  Administered 2019-06-25: 50 mg via INTRAVENOUS

## 2019-06-25 MED ORDER — HEPARIN SODIUM (PORCINE) 1000 UNIT/ML IJ SOLN
INTRAMUSCULAR | Status: DC | PRN
  Administered 2019-06-25: 10000 [IU] via INTRAVENOUS

## 2019-06-25 MED ORDER — FENTANYL CITRATE (PF) 100 MCG/2ML IJ SOLN
INTRAMUSCULAR | Status: DC | PRN
  Administered 2019-06-25: 25 ug via INTRAVENOUS
  Administered 2019-06-25: 50 ug via INTRAVENOUS
  Administered 2019-06-25: 25 ug via INTRAVENOUS
  Administered 2019-06-25: 50 ug via INTRAVENOUS
  Administered 2019-06-25 (×2): 25 ug via INTRAVENOUS

## 2019-06-25 MED ORDER — PROTAMINE SULFATE 10 MG/ML IV SOLN
INTRAVENOUS | Status: AC
  Filled 2019-06-25: qty 5

## 2019-06-25 MED ORDER — PROMETHAZINE HCL 25 MG/ML IJ SOLN
INTRAMUSCULAR | Status: AC
  Filled 2019-06-25: qty 1

## 2019-06-25 MED ORDER — SODIUM CHLORIDE (PF) 0.9 % IJ SOLN
INTRAMUSCULAR | Status: DC | PRN
  Administered 2019-06-25: 52 mL

## 2019-06-25 MED ORDER — HEPARIN SODIUM (PORCINE) 1000 UNIT/ML IJ SOLN
INTRAMUSCULAR | Status: AC
  Filled 2019-06-25: qty 1

## 2019-06-25 MED ORDER — SODIUM CHLORIDE 0.9 % IV BOLUS
250.0000 mL | Freq: Once | INTRAVENOUS | Status: AC
  Administered 2019-06-25: 250 mL via INTRAVENOUS

## 2019-06-25 MED ORDER — SODIUM CHLORIDE 0.9 % IV SOLN
INTRAVENOUS | Status: DC | PRN
  Administered 2019-06-25: 500 mL

## 2019-06-25 MED ORDER — PROMETHAZINE HCL 25 MG/ML IJ SOLN: 6.2500 mg | Freq: Four times a day (QID) | INTRAMUSCULAR | Status: DC | PRN

## 2019-06-25 MED ORDER — ONDANSETRON HCL 4 MG/2ML IJ SOLN
INTRAMUSCULAR | Status: DC | PRN
  Administered 2019-06-25: 4 mg via INTRAVENOUS

## 2019-06-25 MED ORDER — HEPARIN SODIUM (PORCINE) 1000 UNIT/ML IJ SOLN
INTRAMUSCULAR | Status: DC | PRN
  Administered 2019-06-25: 1000 [IU] via INTRAVENOUS

## 2019-06-25 MED ORDER — FENTANYL CITRATE (PF) 100 MCG/2ML IJ SOLN: 25.0000 ug | INTRAMUSCULAR | Status: DC | PRN

## 2019-06-25 MED ORDER — TRAMADOL HCL 50 MG PO TABS: 50.0000 mg | ORAL_TABLET | Freq: Four times a day (QID) | ORAL | 0 refills | Status: DC | PRN

## 2019-06-25 MED ORDER — SODIUM CHLORIDE 0.9 % IV SOLN
INTRAVENOUS | Status: DC | PRN
  Administered 2019-06-25: 50 ug/min via INTRAVENOUS

## 2019-06-25 MED ORDER — PHENYLEPHRINE HCL (PRESSORS) 10 MG/ML IV SOLN
INTRAVENOUS | Status: DC | PRN
  Administered 2019-06-25: 100 ug via INTRAVENOUS
  Administered 2019-06-25: 200 ug via INTRAVENOUS

## 2019-06-25 SURGICAL SUPPLY — 70 items
ARMBAND PINK RESTRICT EXTREMIT (MISCELLANEOUS) ×14 IMPLANT
BAG DECANTER FOR FLEXI CONT (MISCELLANEOUS) ×7 IMPLANT
BIOPATCH RED 1 DISK 7.0 (GAUZE/BANDAGES/DRESSINGS) ×6 IMPLANT
BIOPATCH RED 1IN DISK 7.0MM (GAUZE/BANDAGES/DRESSINGS) ×1
CANISTER SUCT 3000ML PPV (MISCELLANEOUS) ×7 IMPLANT
CANNULA VESSEL 3MM 2 BLNT TIP (CANNULA) ×2 IMPLANT
CATH PALINDROME RT-P 15FX19CM (CATHETERS) IMPLANT
CATH PALINDROME RT-P 15FX23CM (CATHETERS) IMPLANT
CATH PALINDROME RT-P 15FX28CM (CATHETERS) IMPLANT
CATH PALINDROME RT-P 15FX55CM (CATHETERS) IMPLANT
CLIP VESOCCLUDE MED 6/CT (CLIP) ×7 IMPLANT
CLIP VESOCCLUDE SM WIDE 6/CT (CLIP) ×7 IMPLANT
COVER PROBE W GEL 5X96 (DRAPES) ×9 IMPLANT
COVER SURGICAL LIGHT HANDLE (MISCELLANEOUS) ×7 IMPLANT
COVER WAND RF STERILE (DRAPES) ×7 IMPLANT
DERMABOND ADVANCED (GAUZE/BANDAGES/DRESSINGS) ×6
DERMABOND ADVANCED .7 DNX12 (GAUZE/BANDAGES/DRESSINGS) ×5 IMPLANT
DRAPE C-ARM 42X72 X-RAY (DRAPES) ×7 IMPLANT
DRAPE CHEST BREAST 15X10 FENES (DRAPES) ×7 IMPLANT
DRAPE HALF SHEET 40X57 (DRAPES) ×1 IMPLANT
DRAPE ORTHO SPLIT 77X108 STRL (DRAPES) ×4
DRAPE SURG ORHT 6 SPLT 77X108 (DRAPES) IMPLANT
ELECT REM PT RETURN 9FT ADLT (ELECTROSURGICAL) ×7
ELECTRODE REM PT RTRN 9FT ADLT (ELECTROSURGICAL) ×5 IMPLANT
GAUZE 4X4 16PLY RFD (DISPOSABLE) ×7 IMPLANT
GLOVE BIO SURGEON STRL SZ7.5 (GLOVE) ×4 IMPLANT
GLOVE BIOGEL PI IND STRL 7.5 (GLOVE) ×5 IMPLANT
GLOVE BIOGEL PI IND STRL 8 (GLOVE) IMPLANT
GLOVE BIOGEL PI INDICATOR 7.5 (GLOVE) ×2
GLOVE BIOGEL PI INDICATOR 8 (GLOVE) ×2
GLOVE SURG SS PI 7.5 STRL IVOR (GLOVE) ×7 IMPLANT
GLOVE SURG SS PI 8.0 STRL IVOR (GLOVE) ×2 IMPLANT
GOWN STRL REUS W/ TWL LRG LVL3 (GOWN DISPOSABLE) ×10 IMPLANT
GOWN STRL REUS W/ TWL XL LVL3 (GOWN DISPOSABLE) ×5 IMPLANT
GOWN STRL REUS W/TWL LRG LVL3 (GOWN DISPOSABLE) ×4
GOWN STRL REUS W/TWL XL LVL3 (GOWN DISPOSABLE) ×2
HEMOSTAT SNOW SURGICEL 2X4 (HEMOSTASIS) IMPLANT
KIT BASIN OR (CUSTOM PROCEDURE TRAY) ×7 IMPLANT
KIT TURNOVER KIT B (KITS) ×7 IMPLANT
LOOP VESSEL MAXI BLUE (MISCELLANEOUS) ×2 IMPLANT
LOOP VESSEL MINI RED (MISCELLANEOUS) ×2 IMPLANT
NDL 18GX1X1/2 (RX/OR ONLY) (NEEDLE) ×5 IMPLANT
NDL HYPO 25GX1X1/2 BEV (NEEDLE) ×5 IMPLANT
NEEDLE 18GX1X1/2 (RX/OR ONLY) (NEEDLE) ×7 IMPLANT
NEEDLE HYPO 25GX1X1/2 BEV (NEEDLE) ×7 IMPLANT
NS IRRIG 1000ML POUR BTL (IV SOLUTION) ×7 IMPLANT
PACK CV ACCESS (CUSTOM PROCEDURE TRAY) ×7 IMPLANT
PACK SURGICAL SETUP 50X90 (CUSTOM PROCEDURE TRAY) ×7 IMPLANT
PAD ARMBOARD 7.5X6 YLW CONV (MISCELLANEOUS) ×14 IMPLANT
PENCIL BUTTON HOLSTER BLD 10FT (ELECTRODE) ×2 IMPLANT
SOAP 2 % CHG 4 OZ (WOUND CARE) ×7 IMPLANT
STOCKINETTE IMPERVIOUS LG (DRAPES) ×2 IMPLANT
SUT ETHILON 3 0 PS 1 (SUTURE) ×7 IMPLANT
SUT PROLENE 6 0 BV (SUTURE) IMPLANT
SUT PROLENE 6 0 CC (SUTURE) ×7 IMPLANT
SUT SILK 0 TIES 10X30 (SUTURE) ×7 IMPLANT
SUT VIC AB 3-0 SH 27 (SUTURE) ×2
SUT VIC AB 3-0 SH 27X BRD (SUTURE) ×5 IMPLANT
SUT VICRYL 4-0 PS2 18IN ABS (SUTURE) ×7 IMPLANT
SYR 10ML LL (SYRINGE) ×7 IMPLANT
SYR 20CC LL (SYRINGE) ×14 IMPLANT
SYR 5ML LL (SYRINGE) ×7 IMPLANT
SYR CONTROL 10ML LL (SYRINGE) ×7 IMPLANT
TAPE STRIPS DRAPE STRL (GAUZE/BANDAGES/DRESSINGS) ×2 IMPLANT
TOWEL GREEN STERILE (TOWEL DISPOSABLE) ×14 IMPLANT
TOWEL GREEN STERILE FF (TOWEL DISPOSABLE) ×7 IMPLANT
TUBE CONNECTING 12'X1/4 (SUCTIONS) ×1
TUBE CONNECTING 12X1/4 (SUCTIONS) ×1 IMPLANT
UNDERPAD 30X30 (UNDERPADS AND DIAPERS) ×7 IMPLANT
WATER STERILE IRR 1000ML POUR (IV SOLUTION) ×7 IMPLANT

## 2019-06-25 NOTE — Progress Notes (Signed)
HD catheter heparin locked by CRNA Merleen Nicely prior to discharge.

## 2019-06-25 NOTE — Interval H&P Note (Signed)
History and Physical Interval Note:  06/25/2019 1:19 PM  Thomas [name]  has presented today for surgery, with the diagnosis of END STAGE RENAL DISEASE FOR HEMODIALYSIS ACCESS.  The various methods of treatment have been discussed with the patient and family. After consideration of risks, benefits and other options for treatment, the patient has consented to  Procedure(s): LIGATION OF ARTERIOVENOUS  FISTULA RIGHT ARM (Right) ARTERIOVENOUS (AV) FISTULA CREATION VERSUS INSERTION OF ARTERIOVENOUS GRAFT LEFT ARM (Left) INSERTION OF TUNNELED  DIALYSIS CATHETER (N/A) as a surgical intervention.  The patient's history has been reviewed, patient examined, no change in status, stable for surgery.  I have reviewed the patient's chart and labs.  Questions were answered to the patient's satisfaction.     Annamarie Major

## 2019-06-25 NOTE — Op Note (Signed)
    Patient name: Antonios Ostrow MRN: [id number] DOB: [birth year] Sex: male  06/25/2019 Pre-operative Diagnosis: Right finger ulcer  Post-operative diagnosis:  Same Surgeon:  Annamarie Major Assistants:  none Procedure:   Ultrasound-guided placement of a 27 cm right internal jugular vein palindrome tunneled dialysis catheter Anesthesia: General Blood Loss: None Specimens: None  Findings: Catheter tip at cavoatrial junction  Indications: The patient suffers from end-stage renal disease and is on dialysis via a right radiocephalic fistula.  He has developed a nonhealing wound on his thumb.  He has been seen by hand surgery who felt that amputation was necessary.  I am not sure that the patient can heal a wound on his right hand without ligation of the fistula.  This was discussed with the patient and he is agreed to catheter placement, ligation of his fistula, and creation of a new left arm fistula.  Procedure:  The patient was identified in the holding area and taken to [place] [facility]  The patient was then placed supine on the table. general anesthesia was administered.  The patient was prepped and draped in the usual sterile fashion.  A time out was called and antibiotics were administered.  Ultrasound was used to evaluate the right internal jugular vein which is widely patent and easily compressible.  The right internal jugular vein was then cannulated under ultrasound guidance with an 18-gauge needle.  An 035 wire was advanced into the right atrium under fluoroscopic visualization.  Sequential dilators were used to dilate the subcutaneous tract and a peel-away sheath was placed.  Next a 27 cm palindrome catheter was inserted and the peel-away sheath was removed.  A skin exit site was selected and a skin nick was made.  A subcutaneous tunnel was then created and the catheter was pulled through the subcutaneous tunnel and the cuff was situated at the skin exit site.  The ports were connected to the  catheter.  Both ports flushed and aspirated without difficulty.  Fluoroscopy confirmed that the catheter tip was in good position and that there were no kinks within the catheter.  The catheter was then sutured into position with 3-0 nylon.  The neck incision was closed with 4-0 Vicryl.  The catheter was filled with the appropriate volumes of heparin.  At this point, I was called for an emergency in the Cath Lab.  Dr. Scot Dock came in and performed the remaining portion of the procedure.  Please see his operative note for full details.   Disposition: To PACU stable.   Theotis Burrow, M.D., [facility] [place] Office: [phone] Pager:  [phone]

## 2019-06-25 NOTE — Anesthesia Postprocedure Evaluation (Signed)
Anesthesia Post Note  Patient: Thomas [name]  Procedure(s) Performed: INSERTION OF TUNNELED  DIALYSIS CATHETER Right Internal Jugular. (Right Chest) Arteriovenous (Av) Fistula Creation left arm. (Left Arm Lower) Ligation Of Arteriovenous  Fistula right arm. (Right Arm Lower)     Patient location during evaluation: PACU Anesthesia Type: General Level of consciousness: awake and alert Pain management: pain level controlled Vital Signs Assessment: post-procedure vital signs reviewed and stable Respiratory status: spontaneous breathing, nonlabored ventilation and respiratory function stable Cardiovascular status: blood pressure returned to baseline and stable Postop Assessment: no apparent nausea or vomiting Anesthetic complications: no    Last Vitals:  Vitals:   06/25/19 1645 06/25/19 1700  BP: 105/70 103/74  Pulse: 71 70  Resp: 17 17  Temp:    SpO2: 93% 95%    Last Pain:  Vitals:   06/25/19 1630  TempSrc:   PainSc: Asleep                 Clementine Soulliere,W. EDMOND

## 2019-06-25 NOTE — Anesthesia Preprocedure Evaluation (Addendum)
Anesthesia Evaluation  Patient identified by MRN, date of birth, ID band Patient awake    Reviewed: Allergy & Precautions, H&P , NPO status , Patient's Chart, lab work & pertinent test results, reviewed documented beta blocker date and time   Airway Mallampati: II  TM Distance: >3 FB Neck ROM: Full    Dental no notable dental hx. (+) Teeth Intact   Pulmonary neg pulmonary ROS,    Pulmonary exam normal breath sounds clear to auscultation       Cardiovascular hypertension, Pt. on medications and Pt. on home beta blockers + Past MI and +CHF  + dysrhythmias Atrial Fibrillation  Rhythm:Regular Rate:Normal     Neuro/Psych  Headaches, negative psych ROS   GI/Hepatic Neg liver ROS, GERD  Medicated and Controlled,  Endo/Other  diabetes, Type 1, Insulin Dependent  Renal/GU ESRF and DialysisRenal disease  negative genitourinary   Musculoskeletal   Abdominal   Peds  Hematology  (+) HIV,   Anesthesia Other Findings   Reproductive/Obstetrics negative OB ROS                           Anesthesia Physical Anesthesia Plan  ASA: III  Anesthesia Plan: MAC   Post-op Pain Management:    Induction: Intravenous  PONV Risk Score and Plan: 2 and Ondansetron and Propofol infusion  Airway Management Planned: Simple Face Mask  Additional Equipment:   Intra-op Plan:   Post-operative Plan:   Informed Consent: I have reviewed the patients History and Physical, chart, labs and discussed the procedure including the risks, benefits and alternatives for the proposed anesthesia with the patient or authorized representative who has indicated his/her understanding and acceptance.     Dental advisory given  Plan Discussed with: CRNA  Anesthesia Plan Comments:         Anesthesia Quick Evaluation

## 2019-06-25 NOTE — Op Note (Signed)
° ° °  NAME: Thomas [name]    MRN: [id number] DOB: May 07, 1966    DATE OF OPERATION: 06/25/2019  PREOP DIAGNOSIS:    Steal syndrome right upper extremity, end-stage renal disease  POSTOP DIAGNOSIS:    Same  PROCEDURE:    1.  Left radiocephalic AV fistula 2.  Ligation of right radiocephalic AV fistula 3.  Placement of tunneled dialysis catheter dictated separately by Dr. Trula Slade.  SURGEON: Judeth Cornfield. Scot Dock, MD, FACS  ASSIST: Florence Cellar, RNFA  ANESTHESIA: General  EBL: Minimal  INDICATIONS:    [name] is a 53 y.o. male who had steal symptoms in the right arm.  He was brought in for placement of a catheter, ligation of the fistula on the right, and placement of new access on the left.  FINDINGS:   The radial artery on the left was calcified but there was one soft area that I could sew to.  The cephalic vein was 3 mm in diameter.  TECHNIQUE:   The patient was taken to the operating room and received a general anesthetic.  A tunneled dialysis catheter was placed as dictated by Dr. Trula Slade.  At this point he was called for an emergency and I came to place the fistula in the left arm and ligate the fistula on the right.  The left arm and artery been prepped and draped into the field.  I made an oblique incision in the wrist and here I dissected free the cephalic vein which was about a 3 mm vein.  This was ligated distally and irrigated up with heparinized saline.  It was reasonable size.  Next I dissected the radial artery free beneath the fascia.  It was markedly calcified.  I had to dissect down further distally to find a soft spot like potentially clamp and so the anastomosis.  The soft spot was identified.  The patient was heparinized.  The radial artery was clamped proximally and distally and a longitudinal arteriotomy was made.  The vein was spatulated and sewn end-to-side to the artery using continuous 6-0 Prolene suture.  At the completion I was able to pass a 2-1/2 dilator  through the radial artery in each direction.  There was a palpable thrill in the fistula and a good radial signal with the Doppler.  The heparin was partially reversed with protamine.  The wound was closed with interrupted 3-0 Vicryl's and the skin closed with 4-0 Vicryl's.  Attention was then turned to the left arm.  The left arm was prepped and draped in the usual sterile fashion.  A longitudinal incision was made over the proximal fistula.  Here the fistula was dissected free just beyond the anastomosis.  This was ligated with a 2-0 silk tie.  At this point there was a good radial pulse.  Hemostasis was obtained in the wound.  The wound was closed with interrupted 3-0 Vicryl's and the skin closed with 4-0 Vicryl.  Dermabond was applied.  Patient tolerated the procedure well was transferred to the recovery room in stable condition.  All needle and sponge counts were correct.  Deitra Mayo, MD, FACS [facility]  DATE OF DICTATION:   06/25/2019

## 2019-06-25 NOTE — Anesthesia Procedure Notes (Signed)
Procedure Name: LMA Insertion Date/Time: 06/25/2019 1:45 PM Performed by: Inda Coke, CRNA Pre-anesthesia Checklist: Patient identified, Emergency Drugs available, Suction available and Patient being monitored Patient Re-evaluated:Patient Re-evaluated prior to induction Oxygen Delivery Method: Circle System Utilized Preoxygenation: Pre-oxygenation with 100% oxygen Induction Type: IV induction Ventilation: Mask ventilation without difficulty LMA: LMA inserted LMA Size: 5.0 Number of attempts: 1 Placement Confirmation: positive ETCO2 Tube secured with: Tape Dental Injury: Teeth and Oropharynx as per pre-operative assessment

## 2019-06-25 NOTE — Progress Notes (Signed)
Pt. Hypotensive. Dr. Linna Caprice notified. Verbal ok given to plave IV on rt hand.

## 2019-06-25 NOTE — Transfer of Care (Signed)
Immediate Anesthesia Transfer of Care Note  Patient: Thomas [name]  Procedure(s) Performed: INSERTION OF TUNNELED  DIALYSIS CATHETER Right Internal Jugular. (Right Chest) Arteriovenous (Av) Fistula Creation left arm. (Left Arm Lower) Ligation Of Arteriovenous  Fistula right arm. (Right Arm Lower)  Patient Location: PACU  Anesthesia Type:General  Level of Consciousness: awake and drowsy  Airway & Oxygen Therapy: Patient Spontanous Breathing and Patient connected to face mask oxygen  Post-op Assessment: Report given to RN and Post -op Vital signs reviewed and stable  Post vital signs: Reviewed and stable  Last Vitals:  Vitals Value Taken Time  BP 102/71 06/25/19 1630  Temp 36.1 C 06/25/19 1630  Pulse 68 06/25/19 1633  Resp 19 06/25/19 1634  SpO2 100 % 06/25/19 1633  Vitals shown include unvalidated device data.  Last Pain:  Vitals:   06/25/19 1630  TempSrc:   PainSc: Asleep      Patients Stated Pain Goal: 3 (62/86/38 1771)  Complications: No apparent anesthesia complications

## 2019-06-26 ENCOUNTER — Encounter (HOSPITAL_COMMUNITY): Payer: Self-pay | Admitting: Surgery

## 2019-06-29 ENCOUNTER — Telehealth: Payer: Self-pay

## 2019-06-29 NOTE — Telephone Encounter (Signed)
Called patient to check in and see how he is tolerating Rybelsus. Patient reports no complaints and is following the instructions on how to take the medication (empty stomach with 4 ounces of water). Patient has stopped taking Tradjenta once he started Rybelsus. His current DM medications are Humalog, Lantus and Rybelsus.  Patient has pain in his hands and checking his sugars is painful. He reports the recent sugars he has checked have been 180, 210, 175, and 295 being the highest. Asked patient if he had a chance to call [employer] for a CGM. Patient requested a text with the number to contact for the CGM since the company won't let us contact them on the patient's behalf.  Will discuss increasing patient's dose or adjusting his other diabetes medications with Dr. [name] and Dr. Tarri Abernethy if his sugars are still uncontrolled.

## 2019-07-02 ENCOUNTER — Other Ambulatory Visit: Payer: Self-pay | Admitting: Cardiology

## 2019-07-03 LAB — POCT INR: INR: 7.2 — AB (ref 2.0–3.0)

## 2019-07-06 MED FILL — BIKTARVY 50-200-25 MG TABS: 50-200-25 | 30 days supply | Qty: 30 | Fill #4

## 2019-07-06 NOTE — Telephone Encounter (Signed)
Hi [place], I can't remember if I sent you the number for [employer] ([phone]) for the CGM.  Thank you!

## 2019-07-07 ENCOUNTER — Encounter: Payer: Self-pay | Admitting: Cardiology

## 2019-07-07 ENCOUNTER — Telehealth: Payer: Self-pay | Admitting: Pharmacist

## 2019-07-07 DIAGNOSIS — Z5181 Encounter for therapeutic drug level monitoring: Secondary | ICD-10-CM

## 2019-07-07 NOTE — Telephone Encounter (Signed)
INR checked at dialysis on 7/4 was 7.2, just received fax today. Called pt and left message to schedule INR check in office today or tomorrow.

## 2019-07-07 NOTE — Progress Notes (Signed)
This encounter was created in error - please disregard.

## 2019-07-08 NOTE — Telephone Encounter (Signed)
Called pt again, left message.

## 2019-07-09 NOTE — Telephone Encounter (Signed)
Contacted the patient to see if how he was tolerating the Rybelsus and to see if he had contacted [place] for a CGM. Patient said he called once and the phone kept ringing and wouldn't go through. He said he was tolerating the new medication well and did not seem to be having any issues. He is having difficulty checking his sugars due to slow blood flow in his hands and says it is extremely painful.  Told patient I would double the number to make sure I sent him the correct one. Called [employer] and it connected after the two rings. Will resend the number to the patient and have him give them a call again.   Will follow up with the patient next week. If he has yet to get into contact with [employer], will try to convince him to come in for a clinic CGM to get started.

## 2019-07-12 ENCOUNTER — Ambulatory Visit (INDEPENDENT_AMBULATORY_CARE_PROVIDER_SITE_OTHER): Payer: Medicare Other | Admitting: *Deleted

## 2019-07-12 ENCOUNTER — Other Ambulatory Visit: Payer: Self-pay

## 2019-07-12 DIAGNOSIS — I4891 Unspecified atrial fibrillation: Secondary | ICD-10-CM | POA: Diagnosis not present

## 2019-07-12 DIAGNOSIS — Z5181 Encounter for therapeutic drug level monitoring: Secondary | ICD-10-CM | POA: Diagnosis not present

## 2019-07-12 LAB — POCT INR: INR: 2.6 (ref 2.0–3.0)

## 2019-07-12 NOTE — Patient Instructions (Signed)
Description   Continue taking 1/2 tablet daily except 1 tablet on Mondays, Wednesdays, and Saturdays.  Recheck INR in 4 weeks.  [facility] [phone] Main [phone]

## 2019-07-14 ENCOUNTER — Other Ambulatory Visit: Payer: Self-pay | Admitting: Cardiology

## 2019-07-19 ENCOUNTER — Telehealth: Payer: Self-pay

## 2019-07-19 NOTE — Telephone Encounter (Signed)
Contacted patient to see if he had called [employer] yet. Patient reports not calling. I asked if he would like to come into the clinic to get a clinic based CGM for 2 weeks. He requested 7/27 at 12pm.

## 2019-07-20 ENCOUNTER — Other Ambulatory Visit: Payer: Self-pay | Admitting: Internal Medicine

## 2019-07-20 DIAGNOSIS — E118 Type 2 diabetes mellitus with unspecified complications: Secondary | ICD-10-CM

## 2019-07-26 ENCOUNTER — Ambulatory Visit: Payer: Medicare Other | Admitting: Pharmacist

## 2019-07-26 ENCOUNTER — Other Ambulatory Visit: Payer: Self-pay

## 2019-07-26 DIAGNOSIS — E118 Type 2 diabetes mellitus with unspecified complications: Secondary | ICD-10-CM

## 2019-07-26 NOTE — Progress Notes (Signed)
Patient came in today for a clinic CGM placement. Gave the phone number for [employer] to patient's wife to call about getting patient a home CGM.  Documentation for Freestyle Libre Pro Continuous glucose monitoring Freestyle Libre Pro CGM sensor placed today. Patient was educated about wearing sensor, keeping food, activity and medication log and when to call office. Patient was educated about how to care for the sensor and not to have an MRI, CT or Diathermy while wearing the sensor. Follow-up appointments scheduled: 08/02/2019 at 12:30 with Dr. [name], 08/11/2019 at 1:15 with [facility].   Lot #: [id number] Serial #: [id number] Expiration Date: 10/30/2019  Darci Current Travian Kerner, Student-PharmD 07/26/2019 1:18 PM.

## 2019-07-27 NOTE — Progress Notes (Signed)
Documentation for Freestyle Libre Pro Continuous glucose monitoring Freestyle Libre Pro CGM sensor placed today. Patient was educated about wearing sensor, keeping food, activity and medication log and when to call office. Patient was educated about how to care for the sensor and not to have an MRI, CT or Diathermy while wearing the sensor. Follow up was arranged with the patient for 1 week.   Lot #: [id number] Serial #: [id number] Expiration Date: 10/30/2019  [name], PharmD 07/27/2019 5:06 PM.

## 2019-08-02 ENCOUNTER — Ambulatory Visit (INDEPENDENT_AMBULATORY_CARE_PROVIDER_SITE_OTHER): Payer: Medicare Other | Admitting: Pharmacist

## 2019-08-02 ENCOUNTER — Encounter: Payer: Self-pay | Admitting: Pharmacist

## 2019-08-02 ENCOUNTER — Other Ambulatory Visit: Payer: Self-pay

## 2019-08-02 DIAGNOSIS — E118 Type 2 diabetes mellitus with unspecified complications: Secondary | ICD-10-CM | POA: Diagnosis present

## 2019-08-02 MED ORDER — RYBELSUS 7 MG PO TABS: 1.0000 | ORAL_TABLET | Freq: Every day | ORAL | 3 refills | Status: DC

## 2019-08-02 NOTE — Patient Instructions (Signed)
Thank you for meeting with me today! Please increase Rybelsus dose to 2 tablets (6 mg daily), follow up in 1 week. Feel free to call clinic if you have any questions.

## 2019-08-02 NOTE — Progress Notes (Signed)
Diabetes Management Follow Up Thomas [name] is a 53 y.o. male who presented to clinic for DM management.  Diabetes medications: semaglutide (Rybelsus) 3 mg daily, insulin glargine 30 units, and insulin lispro 10 units BID with meals; patient correctly reports DM regimen and adherence  Home BG average 170 mg/dL (correlates with A1C of around 7.7%) Hypo- and hyperglycemia symptoms: none No hypoglycemic events on CGM download, no results > 300 mg/dL   A/P Blood glucose control: improved significantly  Reviewed appropriate home blood glucose monitoring  Interventions today: Increased Rybelsus from 3 mg daily to 6 mg daily due to patient having 3 mg tablets remaining at home. Once finished with supply of 3 mg tablet, increase to 7 mg daily.  Advised to contact clinic or seek medical attention if concerns or symptoms arise. Follow up in 1 week for CGM sensor download and removal.  Will notify PCP of findings.  The patient verbalized understanding of information provided by repeating back concepts discussed.  Follow-up Return in about 1 week (around 08/09/2019).  [name]

## 2019-08-05 ENCOUNTER — Other Ambulatory Visit: Payer: Self-pay

## 2019-08-05 DIAGNOSIS — N186 End stage renal disease: Secondary | ICD-10-CM

## 2019-08-05 DIAGNOSIS — Z992 Dependence on renal dialysis: Secondary | ICD-10-CM

## 2019-08-09 ENCOUNTER — Ambulatory Visit (INDEPENDENT_AMBULATORY_CARE_PROVIDER_SITE_OTHER): Payer: Self-pay | Admitting: Physician Assistant

## 2019-08-09 ENCOUNTER — Other Ambulatory Visit: Payer: Self-pay

## 2019-08-09 ENCOUNTER — Ambulatory Visit (HOSPITAL_COMMUNITY)
Admission: RE | Admit: 2019-08-09 | Discharge: 2019-08-09 | Disposition: A | Discharge status: Home / Self Care | Payer: Medicare Other | Source: Ambulatory Visit | Attending: Family | Admitting: Family

## 2019-08-09 VITALS — BP 110/69 | HR 97 | Temp 97.7°F | Resp 14 | Ht 77.0 in | Wt 283.0 lb

## 2019-08-09 DIAGNOSIS — N186 End stage renal disease: Secondary | ICD-10-CM | POA: Insufficient documentation

## 2019-08-09 DIAGNOSIS — Z992 Dependence on renal dialysis: Secondary | ICD-10-CM

## 2019-08-09 MED FILL — BIKTARVY 50-200-25 MG TABS: 50-200-25 | 30 days supply | Qty: 30 | Fill #5

## 2019-08-09 NOTE — Progress Notes (Signed)
    Postoperative Access Visit   History of Present Illness   Thomas [name] is a 53 y.o. year old male who presents for postoperative follow-up for: left radiocephalic arteriovenous fistula creation by Dr. Scot Dock (Date: 06/25/19/).  At the same time right radiocephalic fistula was ligated due to a nonhealing wound of right thumb followed by Hand surgery.  He is dialyzing via new tunneled dialysis catheter on a Tuesday Thursday Saturday schedule.  He denies any signs or symptoms of steal syndrome in his left hand.  Incision left wrist is well-healed.  Physical Examination   Vitals:   08/09/19 1321  BP: 110/69  Pulse: 97  Resp: 14  Temp: 97.7 F (36.5 C)  TempSrc: Temporal  SpO2: 97%  Weight: 283 lb (128.4 kg)  Height: 6\' 5"  (1.956 m)   Body mass index is 33.56 kg/m.  bilateral arm Incisions are healed, hand grip is 5/5, sensation in digits is intact, palpable thrill near anastomosis however more pulsatile in mid forearm    Medical Decision Making   Torrey Piccini is a 53 y.o. year old male who presents s/p left radiocephalic arteriovenous fistula   Patent left radiocephalic fistula without signs or symptoms of steal syndrome  Recheck fistula duplex in 1 month; patient may require sidebranch ligation and/or fistulogram prior to use of fistula  Continue HD via [facility] for now   Dagoberto Ligas PA-C [facility] Office: [phone]  Clinic MD: Dr. Trula Slade

## 2019-08-10 ENCOUNTER — Ambulatory Visit (HOSPITAL_COMMUNITY): Payer: Medicare Other

## 2019-08-10 ENCOUNTER — Telehealth: Payer: Self-pay | Admitting: Pharmacist

## 2019-08-10 NOTE — Telephone Encounter (Signed)
Diabetes Management Follow Up Thomas [name] is a 53 y.o. male who was contacted for DM management. Identity was verified using date of birth.  Diabetes medications:Lantus 30 units QHS , Humalog 10 units BID; Rybelsus 6mg  ( 2 tabs of the 3mg  dose) Patient correctly reports DM regimen and adherence  Hypoglycemia symptoms: none Hyperglycemia symptoms: none but did report 1 BG >300 in the last 2 weeks Pt reports checking his BG 1 hr after meals and it usually ranges 185-220   A/P Blood glucose control: stable  Reviewed appropriate home blood glucose monitoring  Interventions today: -No medication changes today but patient will titrate up to Rybelsus 7mg  when he runs out of the 3mg  tabs. He already picked up the prescription from the pharmacy -Pt to return to clinic to download data from CGM and remove CGM; adjust meds as necessary   Advised to contact clinic or seek medical attention if concerns or symptoms arise.  Will notify PCP of findings  The patient verbalized understanding of information provided by repeating back concepts discussed.  Follow-up F/u 8/12 with Thomas [name], PharmD PGY1 [facility] Pharmacy Resident [employer] Phone: [phone]

## 2019-08-11 ENCOUNTER — Ambulatory Visit: Payer: Medicare Other | Admitting: Dietician

## 2019-08-11 ENCOUNTER — Encounter: Payer: Self-pay | Admitting: Dietician

## 2019-08-11 NOTE — Progress Notes (Signed)
CGM download Documentation: [name] presents today for his final CGM download and CGM sensor removal.CGM Results from Download #2  Average is  165  for 14 days Glucose management indicator 7.3 % Time in range (70-180 mg/dL): 64 % (Goal >70%) Time High (181-250 mg/dL) 34 % (Goal < 25%) Time Very High (>250 mg/dl) 2 % (Goal < 5%) Time Low (54-69 mg/dL): 0 % (Goal is <4%) Time Very Low (<54) 0%  (Goal <1%)  Coefficient of variation:23.9% (Goal is <36%)  Diabetes Medications: Lantus 30 units at bedtime, Humalog 10 units twice a day, Rybelsus- 6 mg daily. He does not report nay side effects from rybelsus  Self Monitoring: one time a day: results reported as 210, 220, 190  Meal planning: he reports good appetite, eats 3 meals a day on non-dialysis days, two on dialysis days. Eats vegetables daily, but fruits less often, he reports his favorite fruits oranges and banans are not allowed per the dialysis diet. I encouraged him to ask at dialysis if he could have one serving a day from the high potassium list  Mr. Khatib blood sugars are improved with no hypoglycemia per the CGM reports. His Time in range should increase and be close to goal with the increase in his rybelsus dose. He intends to increase the rybelsus as instructed this Friday to 7 mg daily. He reports having plenty of diabetes supplies. It does not appear that he is at risk for hypoglycemia at this time per the CGM. Can repeat CGM in October-November 2020.  Debera Lat, RD 08/11/2019 12:57 PM.

## 2019-08-13 LAB — PROTIME-INR: INR: 4.1 — AB (ref 0.9–1.1)

## 2019-08-16 ENCOUNTER — Ambulatory Visit (HOSPITAL_COMMUNITY): Admission: RE | Admit: 2019-08-16 | Payer: Medicare Other | Source: Ambulatory Visit

## 2019-08-17 ENCOUNTER — Other Ambulatory Visit: Payer: Self-pay | Admitting: Cardiology

## 2019-08-17 ENCOUNTER — Ambulatory Visit (INDEPENDENT_AMBULATORY_CARE_PROVIDER_SITE_OTHER): Payer: Medicare Other | Admitting: Pharmacist Clinician (PhC)/ Clinical Pharmacy Specialist

## 2019-08-17 ENCOUNTER — Other Ambulatory Visit: Payer: Self-pay | Admitting: Internal Medicine

## 2019-08-17 DIAGNOSIS — I484 Atypical atrial flutter: Secondary | ICD-10-CM

## 2019-08-17 DIAGNOSIS — Z5181 Encounter for therapeutic drug level monitoring: Secondary | ICD-10-CM

## 2019-08-20 ENCOUNTER — Ambulatory Visit (INDEPENDENT_AMBULATORY_CARE_PROVIDER_SITE_OTHER): Payer: Medicare Other | Admitting: *Deleted

## 2019-08-20 ENCOUNTER — Other Ambulatory Visit: Payer: Self-pay

## 2019-08-20 DIAGNOSIS — Z5181 Encounter for therapeutic drug level monitoring: Secondary | ICD-10-CM

## 2019-08-20 DIAGNOSIS — I4891 Unspecified atrial fibrillation: Secondary | ICD-10-CM

## 2019-08-20 LAB — POCT INR: INR: 4.4 — AB (ref 2.0–3.0)

## 2019-08-20 NOTE — Patient Instructions (Signed)
Description   Hold coumadin today and tomorrow, then start taking 1/2 tablet daily except for 1 tablet on Wednesdays and Saturdays.Recheck INR in 1-2 weeks. Call coumadin clinic with any medication changes or upcoming procedures.

## 2019-08-27 ENCOUNTER — Telehealth: Payer: Self-pay | Admitting: Pharmacist

## 2019-08-31 NOTE — Telephone Encounter (Signed)
Unable to reach patient for DM med management follow up, will try back again in a few weeks.

## 2019-09-01 ENCOUNTER — Ambulatory Visit (INDEPENDENT_AMBULATORY_CARE_PROVIDER_SITE_OTHER): Payer: Medicare Other | Admitting: Pharmacist

## 2019-09-01 ENCOUNTER — Other Ambulatory Visit: Payer: Self-pay

## 2019-09-01 DIAGNOSIS — Z5181 Encounter for therapeutic drug level monitoring: Secondary | ICD-10-CM | POA: Diagnosis not present

## 2019-09-01 DIAGNOSIS — I4891 Unspecified atrial fibrillation: Secondary | ICD-10-CM

## 2019-09-01 LAB — POCT INR: INR: 3.8 — AB (ref 2.0–3.0)

## 2019-09-01 NOTE — Patient Instructions (Signed)
Description   Skip your Coumadin today, then continue taking 1/2 tablet daily except for 1 tablet on Wednesdays and Saturdays. Recheck INR in 2 weeks. Call coumadin clinic with any medication changes or upcoming procedures.

## 2019-09-07 MED FILL — BIKTARVY 50-200-25 MG TABS: 50-200-25 | 30 days supply | Qty: 30 | Fill #6

## 2019-09-10 ENCOUNTER — Other Ambulatory Visit: Payer: Self-pay

## 2019-09-10 DIAGNOSIS — Z992 Dependence on renal dialysis: Secondary | ICD-10-CM

## 2019-09-10 DIAGNOSIS — N186 End stage renal disease: Secondary | ICD-10-CM

## 2019-09-13 ENCOUNTER — Ambulatory Visit (HOSPITAL_COMMUNITY)
Admission: RE | Admit: 2019-09-13 | Discharge: 2019-09-13 | Disposition: A | Discharge status: Home / Self Care | Payer: Medicare Other | Source: Ambulatory Visit | Attending: Family | Admitting: Family

## 2019-09-13 ENCOUNTER — Telehealth: Payer: Self-pay

## 2019-09-13 ENCOUNTER — Ambulatory Visit: Payer: Medicare Other | Admitting: Internal Medicine

## 2019-09-13 ENCOUNTER — Other Ambulatory Visit: Payer: Self-pay

## 2019-09-13 ENCOUNTER — Telehealth: Payer: Self-pay | Admitting: *Deleted

## 2019-09-13 DIAGNOSIS — Z992 Dependence on renal dialysis: Secondary | ICD-10-CM | POA: Insufficient documentation

## 2019-09-13 DIAGNOSIS — N186 End stage renal disease: Secondary | ICD-10-CM | POA: Insufficient documentation

## 2019-09-13 NOTE — Telephone Encounter (Signed)
Called patient to assess BG after Rybelsus 7 mg daily initiation. Patient correctly stated strength and dosing instructions. BG average for the past week was 210 per patient.   Dr. [name], can we uptitrate to 14 mg daily?   Brenton Grills, Student-PharmD 09/13/2019 4:49 PM

## 2019-09-13 NOTE — Telephone Encounter (Signed)
Virtual Visit Pre-Appointment Phone Call  Today, I spoke with Thomas [name] and performed the following actions:  1. I explained that we are currently trying to limit exposure to the COVID-19 virus by seeing patients at home rather than in the office.  I explained that the visits are best done by video, but can be done by telephone.  I asked the patient if a virtual visit that the patient would like to try instead of coming into the office. Thomas [name] agreed to proceed with the virtual visit scheduled with Thomas [name]  on 09/16/19.   2.  3. I confirmed the BEST phone number to call the day of the visit and- I included this in appointment notes.  4. I asked if the patient had access to (through a family member/friend) a smartphone with video capability to be used for his visit?"  The patient said yes -    5. I confirmed consent by  a. sending through [place] or by email the [place] as written at the end of this message or  b. verbally as listed below. i. This visit is being performed in the setting of COVID-19. ii. All virtual visits are billed to your insurance company just like a normal visit would be.   iii. We'd like you to understand that the technology does not allow for your provider to perform an examination, and thus may limit your provider's ability to fully assess your condition.  iv. If your provider identifies any concerns that need to be evaluated in person, we will make arrangements to do so.   v. Finally, though the technology is pretty good, we cannot assure that it will always work on either your or our end, and in the setting of a video visit, we may have to convert it to a phone-only visit.  In either situation, we cannot ensure that we have a secure connection.   vi. Are you willing to proceed?"  STAFF: Did the patient verbally acknowledge consent to telehealth visit? Document YES/NO here: YES  2. I advised the patient to be  prepared - I asked that the patient, on the day of his visit, record any information possible with the equipment at his home, such as blood pressure, pulse, oxygen saturation, and your weight and write them all down. I asked the patient to have a pen and paper handy nearby the day of the visit as well.  3. If the patient was scheduled for a video visit, I informed the patient that the visit with the doctor would start with a text to the smartphone # given to [place] by the patient.         If the patient was scheduled for a telephone call, I informed the patient that the visit with the doctor would start with a call to the telephone # given to [place] by the patient.  4. I Informed patient they will receive a phone call 15 minutes prior to their appointment time from a [place] or nurse to review medications, allergies, etc. to prepare for the visit.    TELEPHONE CALL NOTE  Thomas [name] has been deemed a candidate for a follow-up tele-health visit to limit community exposure during the Covid-19 pandemic. I spoke with the patient via phone to ensure availability of phone/video source, confirm preferred email & phone number, and discuss instructions and expectations.  I reminded Thomas [name] to be prepared with any vital sign and/or heart rhythm information  that could potentially be obtained via home monitoring, at the time of his visit. I reminded Thomas [name] to expect a phone call prior to his visit.  Thomas [name], NT 09/13/2019 1:49 PM     FULL LENGTH CONSENT FOR TELE-HEALTH VISIT   I hereby voluntarily request, consent and authorize [facility] and its employed or contracted physicians, physician assistants, nurse practitioners or other licensed health care professionals (the Practitioner), to provide me with telemedicine health care services (the "Services") as deemed necessary by the treating Practitioner. I acknowledge and consent to receive the Services by the Practitioner via telemedicine. I  understand that the telemedicine visit will involve communicating with the Practitioner through live audiovisual communication technology and the disclosure of certain medical information by electronic transmission. I acknowledge that I have been given the opportunity to request an in-person assessment or other available alternative prior to the telemedicine visit and am voluntarily participating in the telemedicine visit.  I understand that I have the right to withhold or withdraw my consent to the use of telemedicine in the course of my care at any time, without affecting my right to future care or treatment, and that the Practitioner or I may terminate the telemedicine visit at any time. I understand that I have the right to inspect all information obtained and/or recorded in the course of the telemedicine visit and may receive copies of available information for a reasonable fee.  I understand that some of the potential risks of receiving the Services via telemedicine include:  [place] Delay or interruption in medical evaluation due to technological equipment failure or disruption; . Information transmitted may not be sufficient (e.g. poor resolution of images) to allow for appropriate medical decision making by the Practitioner; and/or  . In rare instances, security protocols could fail, causing a breach of personal health information.  Furthermore, I acknowledge that it is my responsibility to provide information about my medical history, conditions and care that is complete and accurate to the best of my ability. I acknowledge that Practitioner's advice, recommendations, and/or decision may be based on factors not within their control, such as incomplete or inaccurate data provided by me or distortions of diagnostic images or specimens that may result from electronic transmissions. I understand that the practice of medicine is not an exact science and that Practitioner makes no warranties or guarantees  regarding treatment outcomes. I acknowledge that I will receive a copy of this consent concurrently upon execution via email to the email address I last provided but may also request a printed copy by calling the office of [place].    I understand that my insurance will be billed for this visit.   I have read or had this consent read to me. . I understand the contents of this consent, which adequately explains the benefits and risks of the Services being provided via telemedicine.  . I have been provided ample opportunity to ask questions regarding this consent and the Services and have had my questions answered to my satisfaction. . I give my informed consent for the services to be provided through the use of telemedicine in my medical care  By participating in this telemedicine visit I agree to the above.

## 2019-09-15 ENCOUNTER — Ambulatory Visit: Payer: Medicare Other | Admitting: Family

## 2019-09-16 ENCOUNTER — Ambulatory Visit (INDEPENDENT_AMBULATORY_CARE_PROVIDER_SITE_OTHER): Payer: Self-pay | Admitting: Family

## 2019-09-16 ENCOUNTER — Encounter: Payer: Self-pay | Admitting: Family

## 2019-09-16 ENCOUNTER — Other Ambulatory Visit: Payer: Self-pay

## 2019-09-16 DIAGNOSIS — I770 Arteriovenous fistula, acquired: Secondary | ICD-10-CM

## 2019-09-16 DIAGNOSIS — N186 End stage renal disease: Secondary | ICD-10-CM

## 2019-09-16 DIAGNOSIS — Z992 Dependence on renal dialysis: Secondary | ICD-10-CM

## 2019-09-16 NOTE — Progress Notes (Signed)
Virtual Visit via Telephone Note   I was unable to connect with Thomas [name] on 09/16/2019 using the [employer] by telephone. I called twice, voice mail answered twice at [phone]. I left a message for pt identifying myself, and advised that he call us if he is having problems.    PCP: [name], MD  Chief Complaint: 1 month duplex follow up of AVF since last evaluated on 08-09-19  History of Present Illness: Thomas [name] is a 53 y.o. male who is s/p creation of left radiocephalic AV fistula and ligation of right radiocephalic AV fistula (steal symptoms in the right arm and nonhealing wound of right thumb followed by hand surgery) by Dr. Scot Dock, and placement of tunneled dialysis catheter dictated separately by Dr. Trula Slade on 06-25-19.  Pt was last evaluated on 08-09-19 by M. Eveland PA-C. At that time left radiocephalic fistula was patent without signs or symptoms of steal syndrome.  At that time pt was dialyzing via new tunneled dialysis catheter on a Tuesday Thursday Saturday schedule.  He denied any signs or symptoms of steal syndrome in his left hand.  Incision left wrist was well-healed. Plan was to recheck fistula duplex in 1 month; patient may require sidebranch ligation and/or fistulogram prior to use of fistula. Continue HD via [facility] for now.  He returned on 09-13-19 for AVF duplex, results below.    Past Medical History:  Diagnosis Date  . Anemia   . Ascending aortic aneurysm ([place])   . CAD (coronary artery disease), native coronary artery 01/22/2019  . Chronic combined systolic and diastolic CHF (congestive heart failure) ([place])   . Diabetic foot ulcer ([place]) 09/03/2017  . Diabetic wet gangrene of the foot ([place]) 09/04/2017  . ESRD (end stage renal disease) on dialysis [facility])    [place] [address] [place], [place], [place] (10/15/2018)  . Headache    "monthly" (06/15/2018)  . HIV disease ([place])   . Hyperlipidemia LDL goal <70 01/22/2019  . Hyperparathyroidism, secondary ([place])   .  Hypertension   . Hypertensive heart disease with end stage renal disease on dialysis ([place]) 03/13/2017  . Mallory-Weiss tear 2018  . PAF (paroxysmal atrial fibrillation) ([place])   . Paroxysmal atrial flutter ([place])   . Pneumonia X 1  . QT prolongation   . Subacute osteomyelitis, right ankle and foot ([place])   . SVT (supraventricular tachycardia) ([place])    ? afib or atrial flutter s/p TEE/DCCV with subsequent ablation due to reoccurrence in [place]  . Type 2 diabetes mellitus ([place])   . Wears glasses     Past Surgical History:  Procedure Laterality Date  . AMPUTATION Left    foot  . AMPUTATION Right 12/06/2017   Procedure: AMPUTATION BELOW KNEE;  Surgeon: [name], MD;  Location: [place];  Service: Orthopedics;  Laterality: Right;  . APPLICATION OF WOUND VAC Right 09/04/2017   Procedure: APPLICATION OF WOUND VAC;  Surgeon: [name], DPM;  Location: [place];  Service: Podiatry;  Laterality: Right;  . APPLICATION OF WOUND VAC  12/06/2017   Procedure: APPLICATION OF WOUND VAC;  Surgeon: [name], MD;  Location: [place];  Service: Orthopedics;;  . AV FISTULA PLACEMENT Right 10/19/2012  . AV FISTULA PLACEMENT Left 06/25/2019   Procedure: Arteriovenous (Av) Fistula Creation left arm.;  Surgeon: Angelia Mould, MD;  Location: [place];  Service: Vascular;  Laterality: Left;  . AV FISTULA REPAIR Right ~ 02/2018   "had it cleaned out"  . BELOW KNEE  LEG AMPUTATION Left ~ 2016  . COLONOSCOPY WITH PROPOFOL N/A 07/30/2017   Procedure: COLONOSCOPY WITH PROPOFOL;  Surgeon: [name], MD;  Location: [place];  Service: Endoscopy;  Laterality: N/A;  . ESOPHAGOGASTRODUODENOSCOPY (EGD) WITH PROPOFOL N/A 07/28/2017   Procedure: ESOPHAGOGASTRODUODENOSCOPY (EGD) WITH PROPOFOL;  Surgeon: [name], MD;  Location: [place];  Service: Endoscopy;  Laterality: N/A;  . ESOPHAGOGASTRODUODENOSCOPY (EGD) WITH PROPOFOL N/A 01/15/2018   Procedure: ESOPHAGOGASTRODUODENOSCOPY (EGD) WITH PROPOFOL;   Surgeon: [name], MD;  Location: [facility] ENDOSCOPY;  Service: Endoscopy;  Laterality: N/A;  . FLEXIBLE SIGMOIDOSCOPY N/A 07/27/2017   Procedure: FLEXIBLE SIGMOIDOSCOPY;  Surgeon: [name], MD;  Location: [facility] ENDOSCOPY;  Service: Endoscopy;  Laterality: N/A;  . GRAFT APPLICATION Right [id number]   Procedure: SKIN GRAFT APPLICATION RIGHT FOOT;  Surgeon: [name], DPM;  Location: [place];  Service: Podiatry;  Laterality: Right;  . I&D EXTREMITY Right 09/04/2017   Procedure: IRRIGATION AND DEBRIDEMENT EXTREMITY;  Surgeon: [name], DPM;  Location: [place];  Service: Podiatry;  Laterality: Right;  . I&D EXTREMITY Right 11/12/2017   Procedure: IRRIGATION AND DEBRIDEMENT ULCER RIGHT FOOT;  Surgeon: [name], DPM;  Location: [place];  Service: Podiatry;  Laterality: Right;  . INSERTION OF DIALYSIS CATHETER Right 06/25/2019   Procedure: INSERTION OF TUNNELED  DIALYSIS CATHETER Right Internal Jugular.;  Surgeon: Serafina [name], MD;  Location: [place];  Service: Vascular;  Laterality: Right;  . LEFT HEART CATH AND CORONARY ANGIOGRAPHY N/A 06/18/2018   Procedure: LEFT HEART CATH AND CORONARY ANGIOGRAPHY;  Surgeon: [name], Peter M, MD;  Location: [place] CV LAB;  Service: Cardiovascular;  Laterality: N/A;  . LEFT HEART CATH AND CORONARY ANGIOGRAPHY N/A 01/25/2019   Procedure: LEFT HEART CATH AND CORONARY ANGIOGRAPHY;  Surgeon: [name], Peter M, MD;  Location: [place] CV LAB;  Service: Cardiovascular;  Laterality: N/A;  . LIGATION OF ARTERIOVENOUS  FISTULA Right 06/25/2019   Procedure: Ligation Of Arteriovenous  Fistula right arm.;  Surgeon: Angelia Mould, MD;  Location: [facility];  Service: Vascular;  Laterality: Right;  . WOUND DEBRIDEMENT N/A 09/24/2017   Procedure: DEBRIDEMENT WOUND;  Surgeon: [name], DPM;  Location: [place];  Service: Podiatry;  Laterality: N/A;    Current Outpatient Medications on File Prior to Visit  Medication Sig Dispense Refill  . Accu-Chek  FastClix Lancets MISC Check blood sugar 3 times a day 306 each 3  . acetaminophen (TYLENOL) 325 MG tablet Take 325 mg by mouth every 6 (six) hours as needed for moderate pain or headache.     Marland Kitchen amiodarone (PACERONE) 200 MG tablet Take 1 tablet by mouth once daily 30 tablet 6  . aspirin 81 MG chewable tablet Chew 1 tablet (81 mg total) by mouth daily. 90 tablet 1  . bictegravir-emtricitabine-tenofovir AF (BIKTARVY) 50-200-25 MG TABS tablet Take 1 tablet by mouth daily. 30 tablet 11  . cinacalcet (SENSIPAR) 90 MG tablet Take 90 mg by mouth daily.  6  . ferric citrate (AURYXIA) 1 GM 210 MG(Fe) tablet Take 420-840 mg by mouth See admin instructions. Take 840 mg by mouth three times a day after meals and 420 mg after each snack    . ferric gluconate 125 mg in sodium chloride 0.9 % 100 mL Inject 125 mg into the vein Every Tuesday,Thursday,and Saturday with dialysis.    Marland Kitchen gabapentin (NEURONTIN) 100 MG capsule Take 1 capsule (100 mg total) by mouth every dialysis. 15 capsule 1  . glucose blood (ACCU-CHEK GUIDE)  test strip Use as instructed to check blood sugar 3 times a day 300 each 3  . insulin glargine (LANTUS) 100 unit/mL SOPN Inject 0.3 mLs (30 Units total) into the skin at bedtime. 15 mL 11  . insulin lispro (HUMALOG) 100 UNIT/ML KiwkPen Please inject 10 units, twice daily, ~10 min before your two largest meals of the day. (Patient taking differently: Inject 10 Units into the skin 2 (two) times daily with a meal. ) 15 mL 11  . Insulin Pen Needle 32G X 4 MM MISC Use to inject both mealtime and long acting insulins up to 3 injections  a day 300 each 3  . metoprolol succinate (TOPROL-XL) 100 MG 24 hr tablet TAKE 1 TABLET BY MOUTH ONCE DAILY, TAKE WITH OR IMMEDIATELY FOLLOWING A MEAL 90 tablet 2  . nitroGLYCERIN (NITROSTAT) 0.4 MG SL tablet Place 1 tablet (0.4 mg total) under the tongue every 5 (five) minutes as needed for chest pain. 30 tablet 0  . omega-3 acid ethyl esters (LOVAZA) 1 g capsule Take 2  capsules (2 g total) by mouth 2 (two) times daily. 120 capsule 11  . pantoprazole (PROTONIX) 40 MG tablet Take 2 tablets (80 mg total) by mouth daily. 60 tablet 1  . polycarbophil (FIBERCON) 625 MG tablet Take 1 tablet (625 mg total) by mouth daily. 30 tablet 0  . ranolazine (RANEXA) 500 MG 12 hr tablet Take 1 tablet (500 mg total) by mouth 2 (two) times daily. (Patient taking differently: Take 500 mg by mouth daily. ) 60 tablet 0  . rosuvastatin (CRESTOR) 20 MG tablet TAKE 1 TABLET BY MOUTH ONCE DAILY AT 6PM 90 tablet 0  . Semaglutide (RYBELSUS) 7 MG TABS Take 1 tablet by mouth daily. 30 tablet 3  . Skin Protectants, Misc. (EUCERIN) cream Apply topically as needed for dry skin. 454 g 0  . traMADol (ULTRAM) 50 MG tablet Take 1 tablet (50 mg total) by mouth every 6 (six) hours as needed. 20 tablet 0  . warfarin (COUMADIN) 7.5 MG tablet TAKE 1/2 (ONE-HALF) TABLET BY MOUTH ONCE DAILY EXCEPT 1 TABLET ON MONDAY,WEDNESDAY,AND SATURDAY OR AS DIRECTED BY ANTICOAGULATIN CLINIC 90 tablet 0   No current facility-administered medications on file prior to visit.      Social History   Socioeconomic History  . Marital status: Married    Spouse name: kim  . Number of children: 3  . Years of education: college  . Highest education level: Not on file  Occupational History  . Occupation: disabled  Social Needs  . Financial resource strain: Not on file  . Food insecurity    Worry: Not on file    Inability: Not on file  . Transportation needs    Medical: Not on file    Non-medical: Not on file  Tobacco Use  . Smoking status: Never Smoker  . Smokeless tobacco: Never Used  Substance and Sexual Activity  . Alcohol use: Never    Frequency: Never  . Drug use: Never  . Sexual activity: Not Currently  Lifestyle  . Physical activity    Days per week: Not on file    Minutes per session: Not on file  . Stress: Not on file  Relationships  . Social Herbalist on phone: Not on file    Gets  together: Not on file    Attends religious service: Not on file    Active member of club or organization: Not on file    Attends meetings of  clubs or organizations: Not on file    Relationship status: Not on file  . Intimate partner violence    Fear of current or ex partner: Not on file    Emotionally abused: Not on file    Physically abused: Not on file    Forced sexual activity: Not on file  Other Topics Concern  . Not on file  Social History Narrative   Lives in [place] with wife.      Observations/Objective:  Left Lower Arm AVF Duplex (09-13-19); Findings: +--------------------+----------+-----------------+--------+ AVF                 PSV (cm/s)Flow Vol (mL/min)Comments +--------------------+----------+-----------------+--------+ Native artery inflow   263           146                +--------------------+----------+-----------------+--------+ AVF Anastomosis        250                              +--------------------+----------+-----------------+--------+ +------------+----------+-------------+----------+-------------------------+ OUTFLOW VEINPSV (cm/s)Diameter (cm)Depth (cm)        Describe          +------------+----------+-------------+----------+-------------------------+ AC Fossa        38                                                     +------------+----------+-------------+----------+-------------------------+ Prox Forearm    39        0.63        0.43   0.316cm, 0.374cm branches +------------+----------+-------------+----------+-------------------------+ Mid Forearm     51        0.51        0.43                             +------------+----------+-------------+----------+-------------------------+ Dist Forearm    76        0.44        0.35        0.233cm branch       +------------+----------+-------------+----------+-------------------------+ Summary: Patent AVF with diminished flow in the outflow vein     Assessment and Plan: Thomas [name] is a 53 y.o. male who is s/p creation of left radiocephalic AV fistula and ligation of right radiocephalic AV fistula (steal symptoms in the right arm and nonhealing wound of right thumb followed by hand surgery) by Dr. Scot Dock, and placement of tunneled dialysis catheter dictated separately by Dr. Trula Slade on 06-25-19.  I discussed with Dr. Oneida Alar that I was unable to reach pt, and discussed the results of the pt AVF duplex from 09-13-19. According to the duplex results, the pt AVF may be usable without intervention.  Pt should exercise his left hand several times/day. His dialysis center may start accessing his AVF about 09-25-19, which is 3 months after the placement of the AVF.    Follow Up Instructions:   Follow up as needed.     Gabrielle Dare Thomas [name] [facility] Office: [phone]  09/16/2019, 8:29 AM

## 2019-09-17 ENCOUNTER — Ambulatory Visit (INDEPENDENT_AMBULATORY_CARE_PROVIDER_SITE_OTHER): Payer: Medicare Other

## 2019-09-17 ENCOUNTER — Other Ambulatory Visit: Payer: Self-pay

## 2019-09-17 DIAGNOSIS — Z5181 Encounter for therapeutic drug level monitoring: Secondary | ICD-10-CM | POA: Diagnosis not present

## 2019-09-17 DIAGNOSIS — I4891 Unspecified atrial fibrillation: Secondary | ICD-10-CM

## 2019-09-17 LAB — POCT INR: INR: 4.8 — AB (ref 2.0–3.0)

## 2019-09-17 NOTE — Telephone Encounter (Signed)
Hi Hannah, we may need to re-start Lantus d/t pharmacy refill records not showing any insulin fills lately. We can see what patient says.  Thank you!

## 2019-09-17 NOTE — Patient Instructions (Signed)
Description   Skip today's dosage of Coumadin, then start taking 1/2 tablet daily except for 1 tablet on Wednesdays. Recheck INR in 2 weeks. Call coumadin clinic with any medication changes or upcoming procedures.

## 2019-09-20 ENCOUNTER — Telehealth: Payer: Self-pay

## 2019-09-20 ENCOUNTER — Ambulatory Visit: Payer: Medicare Other | Admitting: Internal Medicine

## 2019-09-20 DIAGNOSIS — E118 Type 2 diabetes mellitus with unspecified complications: Secondary | ICD-10-CM

## 2019-09-20 NOTE — Telephone Encounter (Signed)
Following up with patient regarding lantus restart, LVM   Brenton Grills, Student-PharmD 09/20/2019 11:22 AM

## 2019-09-23 NOTE — Telephone Encounter (Signed)
Briefly spoke with patient, willing to restart Lantus 10 units daily. Did not state that cost was a factor in not getting refills. Did not report any hyper/hypoglycemic events.   Brenton Grills, Student-PharmD 09/23/2019 9:42 AM

## 2019-09-24 MED ORDER — LANTUS SOLOSTAR 100 UNIT/ML ~~LOC~~ SOPN: 10.0000 [IU] | PEN_INJECTOR | Freq: Every day | SUBCUTANEOUS | 11 refills | Status: DC

## 2019-09-24 MED ORDER — INSULIN GLARGINE 100 UNITS/ML SOLOSTAR PEN: 10.0000 [IU] | PEN_INJECTOR | Freq: Every day | SUBCUTANEOUS | 11 refills | Status: DC

## 2019-09-28 ENCOUNTER — Telehealth: Payer: Self-pay

## 2019-09-28 NOTE — Telephone Encounter (Signed)
Called patient regarding Lantus 10 units daily restart. Patient had not yet picked up Rx, stated he was going to the pharmacy today. Follow up in 1 week to assess adherence and tolerability.   Brenton Grills, Student-PharmD 09/28/2019 9:52 AM

## 2019-10-04 ENCOUNTER — Telehealth: Payer: Self-pay | Admitting: Pharmacy Technician

## 2019-10-04 ENCOUNTER — Ambulatory Visit (INDEPENDENT_AMBULATORY_CARE_PROVIDER_SITE_OTHER): Payer: Medicare Other | Admitting: Pharmacist

## 2019-10-04 ENCOUNTER — Other Ambulatory Visit: Payer: Self-pay

## 2019-10-04 DIAGNOSIS — Z5181 Encounter for therapeutic drug level monitoring: Secondary | ICD-10-CM

## 2019-10-04 DIAGNOSIS — I4891 Unspecified atrial fibrillation: Secondary | ICD-10-CM | POA: Diagnosis not present

## 2019-10-04 LAB — POCT INR: INR: 3.6 — AB (ref 2.0–3.0)

## 2019-10-04 MED FILL — BIKTARVY 50-200-25 MG TABS: 50-200-25 | 30 days supply | Qty: 30 | Fill #7

## 2019-10-04 NOTE — Patient Instructions (Signed)
Description   Skip today's dosage of Coumadin, then start taking 1/2 tablet daily. Recheck INR in 2 weeks. Call coumadin clinic with any medication changes or upcoming procedures.

## 2019-10-04 NOTE — Telephone Encounter (Addendum)
[facility] Patient Advocate Encounter  Completed and sent [employer] Advancing Access application for [place] for this patient who is uninsured.    Approved through 12/20/2019  He is approved for 30 days. BIN [id number] PCN [id number] GRP [id number]  ID [id number]   This encounter will be updated until final determination.   Venida Jarvis. Nadara Mustard [place] Patient [facility] for Infectious Disease Phone: [phone] Fax:  [phone]

## 2019-10-08 ENCOUNTER — Telehealth: Payer: Self-pay | Admitting: Pharmacist

## 2019-10-08 DIAGNOSIS — E118 Type 2 diabetes mellitus with unspecified complications: Secondary | ICD-10-CM

## 2019-10-08 MED ORDER — RYBELSUS 14 MG PO TABS: 14.0000 mg | ORAL_TABLET | Freq: Every day | ORAL | 3 refills | Status: DC

## 2019-10-11 ENCOUNTER — Other Ambulatory Visit: Payer: Self-pay

## 2019-10-11 ENCOUNTER — Encounter: Payer: Self-pay | Admitting: Internal Medicine

## 2019-10-11 ENCOUNTER — Ambulatory Visit (INDEPENDENT_AMBULATORY_CARE_PROVIDER_SITE_OTHER): Payer: Medicare Other | Admitting: Internal Medicine

## 2019-10-11 VITALS — BP 102/65 | HR 74 | Temp 98.5°F

## 2019-10-11 DIAGNOSIS — I739 Peripheral vascular disease, unspecified: Secondary | ICD-10-CM | POA: Diagnosis not present

## 2019-10-11 DIAGNOSIS — N186 End stage renal disease: Secondary | ICD-10-CM

## 2019-10-11 DIAGNOSIS — Z79899 Other long term (current) drug therapy: Secondary | ICD-10-CM

## 2019-10-11 DIAGNOSIS — Z992 Dependence on renal dialysis: Secondary | ICD-10-CM | POA: Diagnosis not present

## 2019-10-11 DIAGNOSIS — L97909 Non-pressure chronic ulcer of unspecified part of unspecified lower leg with unspecified severity: Secondary | ICD-10-CM

## 2019-10-11 DIAGNOSIS — I200 Unstable angina: Secondary | ICD-10-CM

## 2019-10-11 DIAGNOSIS — B20 Human immunodeficiency virus [HIV] disease: Secondary | ICD-10-CM

## 2019-10-11 NOTE — Progress Notes (Signed)
RFV: follow up for hiv disease Patient ID: Thomas [name], male   DOB: 1966/02/14, 53 y.o.   MRN: [id number]  HPI CD 4 count of 479/VL 480 (march 2020) on biktarvy, ESRD on HD. Doing well with taking medications. Denies any recent illnesses. No close contacts to covid 19  Outpatient Encounter Medications as of 10/11/2019  Medication Sig  . Accu-Chek FastClix Lancets MISC Check blood sugar 3 times a day  . acetaminophen (TYLENOL) 325 MG tablet Take 325 mg by mouth every 6 (six) hours as needed for moderate pain or headache.   Marland Kitchen amiodarone (PACERONE) 200 MG tablet Take 1 tablet by mouth once daily  . aspirin 81 MG chewable tablet Chew 1 tablet (81 mg total) by mouth daily.  . bictegravir-emtricitabine-tenofovir AF (BIKTARVY) 50-200-25 MG TABS tablet Take 1 tablet by mouth daily.  . cinacalcet (SENSIPAR) 90 MG tablet Take 90 mg by mouth daily.  . ferric citrate (AURYXIA) 1 GM 210 MG(Fe) tablet Take 420-840 mg by mouth See admin instructions. Take 840 mg by mouth three times a day after meals and 420 mg after each snack  . ferric gluconate 125 mg in sodium chloride 0.9 % 100 mL Inject 125 mg into the vein Every Tuesday,Thursday,and Saturday with dialysis.  Marland Kitchen gabapentin (NEURONTIN) 100 MG capsule Take 1 capsule (100 mg total) by mouth every dialysis.  Marland Kitchen glucose blood (ACCU-CHEK GUIDE) test strip Use as instructed to check blood sugar 3 times a day  . Insulin Pen Needle 32G X 4 MM MISC Use to inject both mealtime and long acting insulins up to 3 injections  a day  . metoprolol succinate (TOPROL-XL) 100 MG 24 hr tablet TAKE 1 TABLET BY MOUTH ONCE DAILY, TAKE WITH OR IMMEDIATELY FOLLOWING A MEAL  . nitroGLYCERIN (NITROSTAT) 0.4 MG SL tablet Place 1 tablet (0.4 mg total) under the tongue every 5 (five) minutes as needed for chest pain.  Marland Kitchen omega-3 acid ethyl esters (LOVAZA) 1 g capsule Take 2 capsules (2 g total) by mouth 2 (two) times daily.  . pantoprazole (PROTONIX) 40 MG tablet Take 2 tablets (80  mg total) by mouth daily.  . polycarbophil (FIBERCON) 625 MG tablet Take 1 tablet (625 mg total) by mouth daily.  . ranolazine (RANEXA) 500 MG 12 hr tablet Take 1 tablet (500 mg total) by mouth 2 (two) times daily. (Patient taking differently: Take 500 mg by mouth daily. )  . rosuvastatin (CRESTOR) 20 MG tablet TAKE 1 TABLET BY MOUTH ONCE DAILY AT 6PM  . Semaglutide (RYBELSUS) 14 MG TABS Take 14 mg by mouth daily.  . Skin Protectants, Misc. (EUCERIN) cream Apply topically as needed for dry skin.  Marland Kitchen traMADol (ULTRAM) 50 MG tablet Take 1 tablet (50 mg total) by mouth every 6 (six) hours as needed.  . warfarin (COUMADIN) 7.5 MG tablet TAKE 1/2 (ONE-HALF) TABLET BY MOUTH ONCE DAILY EXCEPT 1 TABLET ON MONDAY,WEDNESDAY,AND SATURDAY OR AS DIRECTED BY ANTICOAGULATIN CLINIC   No facility-administered encounter medications on file as of 10/11/2019.      Patient Active Problem List   Diagnosis Date Noted  . Obstructive sleep apnea 03/12/2019  . Polypharmacy 03/12/2019  . Transaminitis 03/05/2019  . QT prolongation 02/08/2019  . Lesion of finger 02/08/2019  . Pain and numbness of upper extremity 02/08/2019  . Insomnia 02/08/2019  . Hyperlipidemia LDL goal <70 01/22/2019  . CAD (coronary artery disease), native coronary artery 01/22/2019  . Unstable angina ([place]) 01/22/2019  . Ascending aortic aneurysm ([place]) 12/07/2018  .  Noninfectious gastroenteritis 10/27/2018  . 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 Chest pain 10/15/2018  . Non-ST elevation (NSTEMI) myocardial infarction ([place])   . Chest pain with moderate risk for cardiac etiology 06/15/2018  . Atrial flutter ([place]) 06/15/2018  . Somniloquy 06/12/2018  . Encounter for therapeutic drug monitoring 05/27/2018  . Acute on chronic systolic heart failure ([place])   . Murmur, cardiac 02/13/2018  . Mallory-Weiss tear 01/16/2018  . Morbid obesity ([place])   . Fall   . Subtherapeutic international normalized ratio (INR)   . History of left below knee  amputation ([place]) 12/11/2017  . History of supraventricular tachycardia   . S/P bilateral BKA (below knee amputation) ([place])   . Anemia of chronic disease   . Non-ischemic cardiomyopathy ([place])   . Hypokalemia 09/04/2017  . Essential hypertension 09/03/2017  . GERD (gastroesophageal reflux disease) 09/03/2017  . GIB (gastrointestinal bleeding) 07/26/2017  . Anemia due to end stage renal disease ([place]) 07/26/2017  . PAF (paroxysmal atrial fibrillation) ([place]) 07/26/2017  . Symptomatic anemia 07/26/2017  . End stage renal disease ([place]) 05/05/2017  . HIV disease ([place]) 03/25/2017  . Hypertensive heart disease with end stage renal disease on dialysis ([place]) 03/13/2017  . ESRD on dialysis ([place]) 03/13/2017  . Type 2 diabetes mellitus with complication ([place]) [id number]     Health Maintenance Due  Topic Date Due  . FOOT EXAM  07/14/1976  . OPHTHALMOLOGY EXAM  07/14/1976  . URINE MICROALBUMIN  07/14/1976  . TETANUS/TDAP  07/14/1985  . HEMOGLOBIN A1C  07/24/2019  . INFLUENZA VACCINE  07/31/2019     Review of Systems Review of Systems  Constitutional: Negative for fever, chills, diaphoresis, activity change, appetite change, fatigue and unexpected weight change.  HENT: Negative for congestion, sore throat, rhinorrhea, sneezing, trouble swallowing and sinus pressure.  Eyes: Negative for photophobia and visual disturbance.  Respiratory: Negative for cough, chest tightness, shortness of breath, wheezing and stridor.  Cardiovascular: Negative for chest pain, palpitations and leg swelling.  Gastrointestinal: Negative for nausea, vomiting, abdominal pain, diarrhea, constipation, blood in stool, abdominal distention and anal bleeding.  Genitourinary: Negative for dysuria, hematuria, flank pain and difficulty urinating.  Musculoskeletal: Negative for myalgias, back pain, joint swelling, arthralgias and gait problem.  Skin: Negative for color change, pallor, rash and wound.  Neurological: Negative for  dizziness, tremors, weakness and light-headedness.  Hematological: Negative for adenopathy. Does not bruise/bleed easily.  Psychiatric/Behavioral: Negative for behavioral problems, confusion, sleep disturbance, dysphoric mood, decreased concentration and agitation.    Physical Exam   There were no vitals taken for this visit.  BP 102/65   Pulse 74   Temp 98.5 F (36.9 C)  Physical Exam  Constitutional: He is oriented to person, place, and time. He appears well-developed and well-nourished. No distress.  HENT:  Mouth/Throat: Oropharynx is clear and moist. No oropharyngeal exudate.  Cardiovascular: Normal rate, regular rhythm and normal heart sounds. Exam reveals no gallop and no friction rub.  No murmur heard.  Pulmonary/Chest: Effort normal and breath sounds normal. No respiratory distress. He has no wheezes.  Abdominal: Soft. Bowel sounds are normal. He exhibits no distension. There is no tenderness.  Lymphadenopathy:  He has no cervical adenopathy.  Neurological: He is alert and oriented to person, place, and time.  Skin: Skin is warm and dry. No rash noted. No erythema.  Psychiatric: He has a normal mood and affect. His behavior is normal.    Lab Results  Component Value Date   CD4TCELL 37 06/09/2019   Lab Results  Component Value Date   CD4TABS 479 06/09/2019   CD4TABS 190 (L) 03/08/2019   CD4TABS 610 02/16/2018   Lab Results  Component Value Date   HIV1RNAQUANT <20 NOT DETECTED 02/16/2018   Lab Results  Component Value Date   HEPBSAB POS (A) 03/10/2017   Lab Results  Component Value Date   LABRPR NON REAC 03/10/2017    CBC Lab Results  Component Value Date   WBC 9.2 02/24/2019   RBC 3.19 (L) 02/24/2019   HGB 14.6 06/25/2019   HCT 43.0 06/25/2019   PLT 172 02/24/2019   MCV 100.9 (H) 02/24/2019   MCH 32.3 02/24/2019   MCHC 32.0 02/24/2019   RDW 17.9 (H) 02/24/2019   LYMPHSABS 1.3 06/04/2018   MONOABS 0.7 06/04/2018   EOSABS 0.3 06/04/2018    BMET  Lab Results  Component Value Date   NA 137 06/25/2019   K 3.3 (L) 06/25/2019   CL 95 (L) 03/08/2019   CO2 21 03/08/2019   GLUCOSE 198 (H) 06/25/2019   BUN 53 (H) 03/08/2019   CREATININE 9.10 (H) 03/08/2019   CALCIUM 10.8 (H) 03/08/2019   GFRNONAA 6 (L) 03/08/2019   GFRAA 7 (L) 03/08/2019      Assessment and Plan hiv disease= Will get labs. Plan to continue on biktarvy  Health maintenance = received Flu vaccine given at HD  Peripheral vascular ulcer = continues to finish out course of oral abtx. Watch for any worsening. Appears healing  Long term medication = appears to tolerate biktarvy. Approved for ESRd

## 2019-10-12 LAB — T-HELPER CELL (CD4) - (RCID CLINIC ONLY)
CD4 % Helper T Cell: 36 % (ref 33–65)
CD4 T Cell Abs: 368 /uL — ABNORMAL LOW (ref 400–1790)

## 2019-10-14 ENCOUNTER — Encounter: Payer: Medicare Other | Admitting: Internal Medicine

## 2019-10-14 LAB — CBC WITH DIFFERENTIAL/PLATELET
Absolute Monocytes: 595 cells/uL (ref 200–950)
Basophils Absolute: 58 cells/uL (ref 0–200)
Basophils Relative: 0.9 %
Eosinophils Absolute: 198 cells/uL (ref 15–500)
Eosinophils Relative: 3.1 %
HCT: 39.2 % (ref 38.5–50.0)
Hemoglobin: 13.5 g/dL (ref 13.2–17.1)
Lymphs Abs: 1018 cells/uL (ref 850–3900)
MCH: 34.4 pg — ABNORMAL HIGH (ref 27.0–33.0)
MCHC: 34.4 g/dL (ref 32.0–36.0)
MCV: 99.7 fL (ref 80.0–100.0)
MPV: 10.9 fL (ref 7.5–12.5)
Monocytes Relative: 9.3 %
Neutro Abs: 4531 cells/uL (ref 1500–7800)
Neutrophils Relative %: 70.8 %
Platelets: 160 10*3/uL (ref 140–400)
RBC: 3.93 10*6/uL — ABNORMAL LOW (ref 4.20–5.80)
RDW: 14.8 % (ref 11.0–15.0)
Total Lymphocyte: 15.9 %
WBC: 6.4 10*3/uL (ref 3.8–10.8)

## 2019-10-14 LAB — RPR: RPR Ser Ql: NONREACTIVE

## 2019-10-14 LAB — HIV-1 RNA QUANT-NO REFLEX-BLD
HIV 1 RNA Quant: <20 copies/mL — AB
HIV-1 RNA Quant, Log: <1.3 Log copies/mL — AB

## 2019-10-14 MED ORDER — RYBELSUS 14 MG PO TABS: 14.0000 mg | ORAL_TABLET | Freq: Every day | ORAL | 0 refills | Status: AC

## 2019-10-14 MED ORDER — RYBELSUS 3 MG PO TABS: 15.0000 mg | ORAL_TABLET | Freq: Every day | ORAL | 0 refills | Status: DC

## 2019-10-14 NOTE — Telephone Encounter (Signed)
Rybelsus 3 mg samples, qty 30 provided in clinic (instructed to take 5 tablets daily). Insulin was discontinued for now due to patient preference and non-compliance due to cost, so Rybelsus dose was increased from 7 mg to 15 mg (patient will switch to 14 mg next week when authorized by [facility]). Patient was advised to contact clinic if any questions arise and he verbalized understanding.

## 2019-10-14 NOTE — Addendum Note (Signed)
Addended by: Forde Dandy on: 10/14/2019 01:57 PM   Modules accepted: Orders

## 2019-10-14 NOTE — Telephone Encounter (Signed)
Changed dose

## 2019-10-16 ENCOUNTER — Other Ambulatory Visit: Payer: Self-pay

## 2019-10-16 ENCOUNTER — Inpatient Hospital Stay (HOSPITAL_COMMUNITY)
Admission: EM | Admit: 2019-10-16 | Discharge: 2019-10-18 | DRG: 302 | Disposition: A | Discharge status: Home / Self Care | Payer: Medicare Other | Source: Ambulatory Visit | Attending: Internal Medicine | Admitting: Internal Medicine

## 2019-10-16 ENCOUNTER — Emergency Department (HOSPITAL_COMMUNITY): Payer: Medicare Other

## 2019-10-16 ENCOUNTER — Encounter (HOSPITAL_COMMUNITY): Payer: Self-pay | Admitting: Emergency Medicine

## 2019-10-16 DIAGNOSIS — Z7982 Long term (current) use of aspirin: Secondary | ICD-10-CM

## 2019-10-16 DIAGNOSIS — Z885 Allergy status to narcotic agent status: Secondary | ICD-10-CM

## 2019-10-16 DIAGNOSIS — I25110 Atherosclerotic heart disease of native coronary artery with unstable angina pectoris: Secondary | ICD-10-CM | POA: Diagnosis present

## 2019-10-16 DIAGNOSIS — Z8701 Personal history of pneumonia (recurrent): Secondary | ICD-10-CM

## 2019-10-16 DIAGNOSIS — D631 Anemia in chronic kidney disease: Secondary | ICD-10-CM | POA: Diagnosis present

## 2019-10-16 DIAGNOSIS — E1122 Type 2 diabetes mellitus with diabetic chronic kidney disease: Secondary | ICD-10-CM | POA: Diagnosis present

## 2019-10-16 DIAGNOSIS — N2581 Secondary hyperparathyroidism of renal origin: Secondary | ICD-10-CM | POA: Diagnosis present

## 2019-10-16 DIAGNOSIS — Z7901 Long term (current) use of anticoagulants: Secondary | ICD-10-CM

## 2019-10-16 DIAGNOSIS — E118 Type 2 diabetes mellitus with unspecified complications: Secondary | ICD-10-CM | POA: Diagnosis not present

## 2019-10-16 DIAGNOSIS — I200 Unstable angina: Secondary | ICD-10-CM | POA: Diagnosis present

## 2019-10-16 DIAGNOSIS — I132 Hypertensive heart and chronic kidney disease with heart failure and with stage 5 chronic kidney disease, or end stage renal disease: Secondary | ICD-10-CM | POA: Diagnosis present

## 2019-10-16 DIAGNOSIS — N186 End stage renal disease: Secondary | ICD-10-CM | POA: Diagnosis present

## 2019-10-16 DIAGNOSIS — M898X9 Other specified disorders of bone, unspecified site: Secondary | ICD-10-CM | POA: Diagnosis present

## 2019-10-16 DIAGNOSIS — Z992 Dependence on renal dialysis: Secondary | ICD-10-CM

## 2019-10-16 DIAGNOSIS — I420 Dilated cardiomyopathy: Secondary | ICD-10-CM | POA: Diagnosis present

## 2019-10-16 DIAGNOSIS — G4733 Obstructive sleep apnea (adult) (pediatric): Secondary | ICD-10-CM | POA: Diagnosis present

## 2019-10-16 DIAGNOSIS — Z89611 Acquired absence of right leg above knee: Secondary | ICD-10-CM | POA: Diagnosis not present

## 2019-10-16 DIAGNOSIS — I712 Thoracic aortic aneurysm, without rupture: Secondary | ICD-10-CM | POA: Diagnosis present

## 2019-10-16 DIAGNOSIS — Z794 Long term (current) use of insulin: Secondary | ICD-10-CM

## 2019-10-16 DIAGNOSIS — Z89612 Acquired absence of left leg above knee: Secondary | ICD-10-CM | POA: Diagnosis not present

## 2019-10-16 DIAGNOSIS — E782 Mixed hyperlipidemia: Secondary | ICD-10-CM | POA: Diagnosis present

## 2019-10-16 DIAGNOSIS — Z8249 Family history of ischemic heart disease and other diseases of the circulatory system: Secondary | ICD-10-CM

## 2019-10-16 DIAGNOSIS — E1152 Type 2 diabetes mellitus with diabetic peripheral angiopathy with gangrene: Secondary | ICD-10-CM | POA: Diagnosis present

## 2019-10-16 DIAGNOSIS — I5042 Chronic combined systolic (congestive) and diastolic (congestive) heart failure: Secondary | ICD-10-CM | POA: Diagnosis present

## 2019-10-16 DIAGNOSIS — E669 Obesity, unspecified: Secondary | ICD-10-CM | POA: Diagnosis present

## 2019-10-16 DIAGNOSIS — Z79899 Other long term (current) drug therapy: Secondary | ICD-10-CM

## 2019-10-16 DIAGNOSIS — I480 Paroxysmal atrial fibrillation: Secondary | ICD-10-CM | POA: Diagnosis present

## 2019-10-16 DIAGNOSIS — Z833 Family history of diabetes mellitus: Secondary | ICD-10-CM

## 2019-10-16 DIAGNOSIS — I484 Atypical atrial flutter: Secondary | ICD-10-CM | POA: Diagnosis present

## 2019-10-16 DIAGNOSIS — Z89511 Acquired absence of right leg below knee: Secondary | ICD-10-CM

## 2019-10-16 DIAGNOSIS — I2510 Atherosclerotic heart disease of native coronary artery without angina pectoris: Secondary | ICD-10-CM | POA: Diagnosis present

## 2019-10-16 DIAGNOSIS — Z21 Asymptomatic human immunodeficiency virus [HIV] infection status: Secondary | ICD-10-CM | POA: Diagnosis present

## 2019-10-16 DIAGNOSIS — I96 Gangrene, not elsewhere classified: Secondary | ICD-10-CM | POA: Diagnosis present

## 2019-10-16 DIAGNOSIS — Z89512 Acquired absence of left leg below knee: Secondary | ICD-10-CM

## 2019-10-16 DIAGNOSIS — E11628 Type 2 diabetes mellitus with other skin complications: Secondary | ICD-10-CM | POA: Diagnosis not present

## 2019-10-16 DIAGNOSIS — Z20828 Contact with and (suspected) exposure to other viral communicable diseases: Secondary | ICD-10-CM | POA: Diagnosis present

## 2019-10-16 DIAGNOSIS — L989 Disorder of the skin and subcutaneous tissue, unspecified: Secondary | ICD-10-CM | POA: Diagnosis not present

## 2019-10-16 HISTORY — DX: Essential (primary) hypertension: I10

## 2019-10-16 HISTORY — DX: Mixed hyperlipidemia: E78.2

## 2019-10-16 LAB — BASIC METABOLIC PANEL
Anion gap: 17 — ABNORMAL HIGH (ref 5–15)
BUN: 54 mg/dL — ABNORMAL HIGH (ref 6–20)
CO2: 22 mmol/L (ref 22–32)
Calcium: 9.3 mg/dL (ref 8.9–10.3)
Chloride: 99 mmol/L (ref 98–111)
Creatinine, Ser: 10.79 mg/dL — ABNORMAL HIGH (ref 0.61–1.24)
GFR calc Af Amer: 6 mL/min — ABNORMAL LOW (ref >60)
GFR calc non Af Amer: 5 mL/min — ABNORMAL LOW (ref >60)
Glucose, Bld: 193 mg/dL — ABNORMAL HIGH (ref 70–99)
Potassium: 4.1 mmol/L (ref 3.5–5.1)
Sodium: 138 mmol/L (ref 135–145)

## 2019-10-16 LAB — CBC
HCT: 41.8 % (ref 39.0–52.0)
Hemoglobin: 14.5 g/dL (ref 13.0–17.0)
MCH: 35 pg — ABNORMAL HIGH (ref 26.0–34.0)
MCHC: 34.7 g/dL (ref 30.0–36.0)
MCV: 101 fL — ABNORMAL HIGH (ref 80.0–100.0)
Platelets: 141 10*3/uL — ABNORMAL LOW (ref 150–400)
RBC: 4.14 MIL/uL — ABNORMAL LOW (ref 4.22–5.81)
RDW: 14.5 % (ref 11.5–15.5)
WBC: 8 10*3/uL (ref 4.0–10.5)
nRBC: 0 % (ref 0.0–0.2)

## 2019-10-16 LAB — PROTIME-INR
INR: 1.8 — ABNORMAL HIGH (ref 0.8–1.2)
Prothrombin Time: 21 seconds — ABNORMAL HIGH (ref 11.4–15.2)

## 2019-10-16 LAB — SARS CORONAVIRUS 2 BY RT PCR (HOSPITAL ORDER, PERFORMED IN ~~LOC~~ HOSPITAL LAB): SARS Coronavirus 2: NEGATIVE

## 2019-10-16 LAB — TROPONIN I (HIGH SENSITIVITY): Troponin I (High Sensitivity): 153 ng/L (ref <18)

## 2019-10-16 MED ORDER — NITROGLYCERIN 0.4 MG SL SUBL: 0.4000 mg | SUBLINGUAL_TABLET | SUBLINGUAL | Status: DC | PRN

## 2019-10-16 MED ORDER — AMIODARONE HCL 200 MG PO TABS
200.0000 mg | ORAL_TABLET | Freq: Every day | ORAL | Status: DC
  Administered 2019-10-17 – 2019-10-18 (×3): 200 mg via ORAL
  Filled 2019-10-16 (×3): qty 1

## 2019-10-16 MED ORDER — TRAMADOL HCL 50 MG PO TABS: 50.0000 mg | ORAL_TABLET | Freq: Two times a day (BID) | ORAL | Status: DC | PRN

## 2019-10-16 MED ORDER — ASPIRIN 81 MG PO CHEW
81.0000 mg | CHEWABLE_TABLET | Freq: Every day | ORAL | Status: DC
  Administered 2019-10-17 – 2019-10-18 (×3): 81 mg via ORAL
  Filled 2019-10-16 (×3): qty 1

## 2019-10-16 MED ORDER — CHLORHEXIDINE GLUCONATE CLOTH 2 % EX PADS
6.0000 | MEDICATED_PAD | Freq: Every day | CUTANEOUS | Status: DC
  Administered 2019-10-17 – 2019-10-18 (×2): 6 via TOPICAL

## 2019-10-16 MED ORDER — ACETAMINOPHEN 325 MG PO TABS
650.0000 mg | ORAL_TABLET | ORAL | Status: DC | PRN
  Administered 2019-10-17 – 2019-10-18 (×3): 650 mg via ORAL
  Filled 2019-10-16 (×2): qty 2

## 2019-10-16 MED ORDER — SODIUM CHLORIDE 0.9% FLUSH
3.0000 mL | Freq: Once | INTRAVENOUS | Status: AC
  Administered 2019-10-16: 3 mL via INTRAVENOUS

## 2019-10-16 MED ORDER — AMOXICILLIN 500 MG PO CAPS
500.0000 mg | ORAL_CAPSULE | Freq: Two times a day (BID) | ORAL | Status: AC
  Administered 2019-10-17 (×2): 500 mg via ORAL
  Filled 2019-10-16 (×2): qty 1

## 2019-10-16 MED ORDER — ROSUVASTATIN CALCIUM 20 MG PO TABS
20.0000 mg | ORAL_TABLET | Freq: Every day | ORAL | Status: DC
  Administered 2019-10-17 – 2019-10-18 (×3): 20 mg via ORAL
  Filled 2019-10-16 (×3): qty 1

## 2019-10-16 MED ORDER — METOPROLOL SUCCINATE ER 100 MG PO TB24
100.0000 mg | ORAL_TABLET | Freq: Every day | ORAL | Status: DC
  Administered 2019-10-17 – 2019-10-18 (×3): 100 mg via ORAL
  Filled 2019-10-16 (×3): qty 1

## 2019-10-16 MED ORDER — GABAPENTIN 100 MG PO CAPS: 100.0000 mg | ORAL_CAPSULE | ORAL | Status: DC

## 2019-10-16 MED ORDER — HEPARIN (PORCINE) 25000 UT/250ML-% IV SOLN
2050.0000 [IU]/h | INTRAVENOUS | Status: DC
  Administered 2019-10-16: 1600 [IU]/h via INTRAVENOUS
  Administered 2019-10-17: 1800 [IU]/h via INTRAVENOUS
  Administered 2019-10-18: 2050 [IU]/h via INTRAVENOUS
  Filled 2019-10-16 (×4): qty 250

## 2019-10-16 MED ORDER — BICTEGRAVIR-EMTRICITAB-TENOFOV 50-200-25 MG PO TABS
1.0000 | ORAL_TABLET | Freq: Every day | ORAL | Status: DC
  Administered 2019-10-17 – 2019-10-18 (×2): 1 via ORAL
  Filled 2019-10-16 (×2): qty 1

## 2019-10-16 MED ORDER — ONDANSETRON HCL 4 MG/2ML IJ SOLN: 4.0000 mg | Freq: Four times a day (QID) | INTRAMUSCULAR | Status: DC | PRN

## 2019-10-16 MED ORDER — INSULIN ASPART 100 UNIT/ML ~~LOC~~ SOLN
0.0000 [IU] | SUBCUTANEOUS | Status: DC
  Administered 2019-10-17: 2 [IU] via SUBCUTANEOUS
  Administered 2019-10-17 (×2): 3 [IU] via SUBCUTANEOUS
  Administered 2019-10-17 (×2): 2 [IU] via SUBCUTANEOUS
  Administered 2019-10-18: 5 [IU] via SUBCUTANEOUS
  Administered 2019-10-18: 2 [IU] via SUBCUTANEOUS
  Administered 2019-10-18: 5 [IU] via SUBCUTANEOUS
  Administered 2019-10-18: 2 [IU] via SUBCUTANEOUS

## 2019-10-16 MED ORDER — RANOLAZINE ER 500 MG PO TB12
500.0000 mg | ORAL_TABLET | Freq: Every day | ORAL | Status: DC
  Administered 2019-10-17 – 2019-10-18 (×3): 500 mg via ORAL
  Filled 2019-10-16 (×3): qty 1

## 2019-10-16 NOTE — ED Provider Notes (Signed)
[facility] EMERGENCY DEPARTMENT Provider Note   CSN: [id number] Arrival date & time: 10/16/19  1445     History   Chief Complaint Chief Complaint  Patient presents with   Chest Pain    HPI Shawnell Gadaleta is a 53 y.o. male.  HPI: A 53 year old patient with a history of treated diabetes, hypertension, hypercholesterolemia and obesity presents for evaluation of chest pain. Initial onset of pain was approximately 1-3 hours ago. The patient's chest pain is described as heaviness/pressure/tightness, is worse with exertion and is relieved by nitroglycerin. The patient's chest pain is middle- or left-sided, is not well-localized, is not sharp and does not radiate to the arms/jaw/neck. The patient does not complain of nausea and denies diaphoresis. The patient has no history of stroke, has no history of peripheral artery disease, has not smoked in the past 90 days and has no relevant family history of coronary artery disease (first degree relative at less than age 53).   HPI  The patient is a 54 year old male with a complex medical history to include anemia, ascending AAA, CAD, CHF, ESRD on HD (TuThSat via right sided PermCath until his L AV fistula matures), HIV (CD4 368 Oct 2020), HTN, HLD, Mallory Weiss tear, Paroxysmal Afib (On Warfarin), DM2, osteomyelitis of the ankle, OSA who presents to the ED with chest pain.  The patient was at dialysis today (only got 1 hr) when he started to experience chest pain. It has since resolved following ASA 325mg  and 2x SL Nitroglycerin. Describes the pain as chest pressure. No radiation. 10/10, improved to 7/10 after nitro, and now has resolved. It felt better when lying forward, and was worsen when he leaned back on his back. No SOB, fevers, does endorse chills. No cough. No flu exposures to COVID 19, no flu like symptoms. No nausea, vomiting, or diaphoresis. He endorsed a mild headache while he was experiencing the CP which has now resolved.     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 T, Th, Sat (10/15/2018)   Essential hypertension    Headache    HIV disease ([place])    Hyperparathyroidism, secondary ([place])    Hypertensive heart disease with end stage renal disease on dialysis [facility])    Mallory-Weiss tear 2018   Mixed hyperlipidemia    PAF (paroxysmal atrial fibrillation) (HCC)    Paroxysmal atrial flutter (HCC)    Pneumonia X 1   QT prolongation    Subacute osteomyelitis, right ankle and foot (HCC)    SVT (supraventricular tachycardia) ([place])    ? afib or atrial flutter s/p TEE/DCCV with subsequent ablation due to reoccurrence in [place]   Type 2 diabetes mellitus ([place])    Wears glasses     Patient Active Problem List   Diagnosis Date Noted   Obstructive sleep apnea 03/12/2019   Polypharmacy 03/12/2019   Transaminitis 03/05/2019   QT prolongation 02/08/2019   Lesion of finger 02/08/2019   Pain and numbness of upper extremity 02/08/2019   Insomnia 02/08/2019   Hyperlipidemia LDL goal <70 01/22/2019   CAD (coronary artery disease), native coronary artery 01/22/2019  Unstable angina ([place]) 01/22/2019   Ascending aortic aneurysm ([place]) 12/07/2018   Noninfectious gastroenteritis 10/27/2018   Intestinal malabsorption 10/27/2018   Chest pain 10/15/2018   Non-ST elevation (NSTEMI) myocardial infarction [facility])    Chest pain with moderate risk for cardiac etiology 06/15/2018   Atrial flutter ([place]) 06/15/2018   [place] 06/12/2018   Encounter for therapeutic drug monitoring 05/27/2018   Acute on chronic systolic heart failure ([place])    Murmur, cardiac  02/13/2018   Mallory-Weiss tear 01/16/2018   Morbid obesity ([place])    Fall    Subtherapeutic international normalized ratio (INR)    History of left below knee amputation ([place]) 12/11/2017   History of supraventricular tachycardia    S/P bilateral BKA (below knee amputation) ([place])    Anemia of chronic disease    Non-ischemic cardiomyopathy ([place])    Hypokalemia 09/04/2017   Essential hypertension 09/03/2017   GERD (gastroesophageal reflux disease) 09/03/2017   GIB (gastrointestinal bleeding) 07/26/2017   Anemia due to end stage renal disease ([place]) 07/26/2017   PAF (paroxysmal atrial fibrillation) ([place]) 07/26/2017   Symptomatic anemia 07/26/2017   End stage renal disease ([place]) 05/05/2017   HIV disease ([place]) 03/25/2017   Hypertensive heart disease with end stage renal disease on dialysis ([place]) 03/13/2017   ESRD on dialysis ([place]) 03/13/2017   Type 2 diabetes mellitus with complication ([place]) [id number]    Past Surgical History:  Procedure Laterality Date   AMPUTATION Left    foot   AMPUTATION Right 12/06/2017   Procedure: AMPUTATION BELOW KNEE;  Surgeon: [name], MD;  Location: [place];  Service: Orthopedics;  Laterality: Right;   APPLICATION OF WOUND VAC Right 09/04/2017   Procedure: APPLICATION OF WOUND VAC;  Surgeon: [name], DPM;  Location: [place];  Service: Podiatry;  Laterality: Right;   APPLICATION OF WOUND VAC  12/06/2017   Procedure: APPLICATION OF WOUND VAC;  Surgeon: [name], MD;  Location: [place];  Service: Orthopedics;;   AV FISTULA PLACEMENT Right 10/19/2012   AV FISTULA PLACEMENT Left 06/25/2019   Procedure: Arteriovenous (Av) Fistula Creation left arm.;  Surgeon: Angelia Mould, MD;  Location: [place];  Service: Vascular;  Laterality: Left;   AV FISTULA REPAIR Right ~ 02/2018   "had it cleaned out"   [place] Left ~ 2016   COLONOSCOPY WITH PROPOFOL N/A 07/30/2017   Procedure: COLONOSCOPY WITH PROPOFOL;   Surgeon: [name], MD;  Location: [place];  Service: Endoscopy;  Laterality: N/A;   ESOPHAGOGASTRODUODENOSCOPY (EGD) WITH PROPOFOL N/A 07/28/2017   Procedure: ESOPHAGOGASTRODUODENOSCOPY (EGD) WITH PROPOFOL;  Surgeon: [name], MD;  Location: [place];  Service: Endoscopy;  Laterality: N/A;   ESOPHAGOGASTRODUODENOSCOPY (EGD) WITH PROPOFOL N/A 01/15/2018   Procedure: ESOPHAGOGASTRODUODENOSCOPY (EGD) WITH PROPOFOL;  Surgeon: [name], MD;  Location: [facility] ENDOSCOPY;  Service: Endoscopy;  Laterality: N/A;   FLEXIBLE SIGMOIDOSCOPY N/A 07/27/2017   Procedure: FLEXIBLE SIGMOIDOSCOPY;  Surgeon: [name], MD;  Location: [facility] END[facility]COPY;  Service: Endoscopy;  Laterality: N/A;   GRAFT APPLICATION Right [id number]   Procedure: SKIN GRAFT APPLICATION RIGHT FOOT;  Surgeon: [name], DPM;  Location: [place];  Service: Podiatry;  Laterality: Right;   I&D EXTREMITY Right 09/04/2017   Procedure: IRRIGATION AND DEBRIDEMENT EXTREMITY;  Surgeon: [name], DPM;  Location: [place];  Service: Podiatry;  Laterality: Right;   I&D EXTREMITY Right 11/12/2017   Procedure: IRRIGATION AND DEBRIDEMENT ULCER RIGHT FOOT;  Surgeon: [name], DPM;  Location: [place];  Service: Podiatry;  Laterality: Right;   INSERTION OF DIALYSIS CATHETER Right 06/25/2019   Procedure: INSERTION OF TUNNELED  DIALYSIS CATHETER Right Internal Jugular.;  Surgeon: Serafina [name], MD;  Location: [facility] OR;  Service: Vascular;  Laterality: Right;   LEFT HEART CATH AND CORONARY ANGIOGRAPHY N/A 06/18/2018   Procedure: LEFT HEART CATH AND CORONARY ANGIOGRAPHY;  Surgeon: [name], Peter M, MD;  Location: [place] CV LAB;  Service: Cardiovascular;  Laterality: N/A;   LEFT HEART CATH AND CORONARY ANGIOGRAPHY N/A 01/25/2019   Procedure: LEFT HEART CATH AND CORONARY ANGIOGRAPHY;  Surgeon: [name], Peter M, MD;  Location: [place] CV LAB;  Service: Cardiovascular;  Laterality: N/A;   LIGATION OF ARTERIOVENOUS   FISTULA Right 06/25/2019   Procedure: Ligation Of Arteriovenous  Fistula right arm.;  Surgeon: Angelia Mould, MD;  Location: [place];  Service: Vascular;  Laterality: Right;   WOUND DEBRIDEMENT N/A 09/24/2017   Procedure: DEBRIDEMENT WOUND;  Surgeon: [name], DPM;  Location: [place];  Service: Podiatry;  Laterality: N/A;        Home Medications    Prior to Admission medications   Medication Sig Start Date End Date Taking? Authorizing Provider  acetaminophen (TYLENOL) 325 MG tablet Take 325 mg by mouth every 6 (six) hours as needed for moderate pain or headache.    Yes [provider]  amiodarone (PACERONE) 200 MG tablet Take 1 tablet by mouth once daily Patient taking differently: Take 200 mg by mouth daily.  08/18/19  Yes Imogene Burn, PA-C  amoxicillin (AMOXIL) 500 MG capsule Take 500 mg by mouth 2 (two) times daily. 10/05/19  Yes [provider]  aspirin 81 MG chewable tablet Chew 1 tablet (81 mg total) by mouth daily. 01/26/19  Yes Helberg, Larkin Ina, MD  bictegravir-emtricitabine-tenofovir AF (BIKTARVY) 50-200-25 MG TABS tablet Take 1 tablet by mouth daily. 03/10/19  Yes Beebe Callas, NP  cinacalcet (SENSIPAR) 90 MG tablet Take 90 mg by mouth daily. 04/27/18  Yes [provider]  ferric citrate (AURYXIA) 1 GM 210 MG(Fe) tablet Take 420-840 mg by mouth See admin instructions. Take 840 mg by mouth three times a day after meals and 420 mg after each snack   Yes [provider]  gabapentin (NEURONTIN) 100 MG capsule Take 1 capsule (100 mg total) by mouth every dialysis. 01/09/18  Yes Jamse Arn, MD  insulin glargine (LANTUS) 100 UNIT/ML injection Inject 30 Units into the skin at bedtime. 01/16/18  Yes [provider]  linagliptin (TRADJENTA) 5 MG TABS tablet Take 5 mg by mouth daily. 01/26/19  Yes [provider]  metoprolol succinate (TOPROL-XL) 100 MG 24 hr tablet TAKE 1 TABLET BY MOUTH ONCE DAILY, TAKE WITH OR  IMMEDIATELY FOLLOWING A MEAL Patient taking differently: Take 100 mg by mouth daily. TAKE 1 TABLET BY MOUTH ONCE DAILY TAKE WITH OR IMMEDIATELY FOLLOWING A MEAL 07/05/19  Yes Lyda Jester M, PA-C  nitroGLYCERIN (NITROSTAT) 0.4 MG SL tablet Place 1 tablet (0.4 mg total) under the tongue every 5 (five) minutes as needed for chest pain. 01/29/19  Yes Dorrell, Andree Elk, MD  ranolazine (RANEXA) 500 MG 12 hr tablet Take 1 tablet (500 mg total) by mouth 2 (two) times daily. Patient taking differently: Take 500 mg by mouth daily.  01/29/19  Yes Dorrell, Andree Elk, MD  rosuvastatin (CRESTOR) 20 MG tablet TAKE 1 TABLET BY MOUTH ONCE DAILY AT 6PM Patient taking differently: Take 20 mg by mouth daily.  08/23/19  Yes Helberg,  Larkin Ina, MD  Semaglutide (RYBELSUS) 14 MG TABS Take 14 mg by mouth daily. 10/14/19  Yes Helberg, Larkin Ina, MD  traMADol (ULTRAM) 50 MG tablet Take 1 tablet (50 mg total) by mouth every 6 (six) hours as needed. 06/25/19  Yes Angelia Mould, MD  warfarin (COUMADIN) 7.5 MG tablet TAKE 1/2 (ONE-HALF) TABLET BY MOUTH ONCE DAILY EXCEPT 1 TABLET ON MONDAY,WEDNESDAY,AND SATURDAY OR AS DIRECTED BY [place] Patient taking differently: Take 3.75 mg by mouth daily at 6 PM.  07/14/19  Yes Turner, Eber Hong, MD  Accu-Chek FastClix Lancets MISC Check blood sugar 3 times a day 03/05/19   Seawell, Jaimie A, DO  ferric gluconate 125 mg in sodium chloride 0.9 % 100 mL Inject 125 mg into the vein Every Tuesday,Thursday,and Saturday with dialysis. Patient not taking: Reported on 10/16/2019 05/10/17   Hosie Poisson, MD  glucose blood (ACCU-CHEK GUIDE) test strip Use as instructed to check blood sugar 3 times a day 03/05/19   Seawell, Jaimie A, DO  insulin lispro (HUMALOG) 100 UNIT/ML injection Inject 10 Units into the skin 2 (two) times daily. 01/26/19   [provider]  Insulin Pen Needle 32G X 4 MM MISC Use to inject both mealtime and long acting insulins up to 3 injections  a day 03/05/19    Seawell, Jaimie A, DO  omega-3 acid ethyl esters (LOVAZA) 1 g capsule Take 2 capsules (2 g total) by mouth 2 (two) times daily. Patient not taking: Reported on 10/16/2019 02/03/19   Sueanne Margarita, MD  pantoprazole (PROTONIX) 40 MG tablet Take 2 tablets (80 mg total) by mouth daily. Patient not taking: Reported on 10/16/2019 02/16/18   Alphonzo Grieve, MD  polycarbophil (FIBERCON) 625 MG tablet Take 1 tablet (625 mg total) by mouth daily. Patient not taking: Reported on 10/16/2019 12/19/17   Angiulli, Lavon Paganini, PA-C  Skin Protectants, Misc. (EUCERIN) cream Apply topically as needed for dry skin. Patient not taking: Reported on 10/16/2019 02/08/19   Welford Roche, MD    Family History Family History  Problem Relation Age of Onset   Heart disease Mother    Diabetes Mother    Heart disease Father    Cancer Neg Hx     Social History Social History   Tobacco Use   Smoking status: Never Smoker   Smokeless tobacco: Never Used  Substance Use Topics   Alcohol use: Never    Frequency: Never   Drug use: Never     Allergies   Oxycodone-acetaminophen   Review of Systems Review of Systems  Constitutional: Positive for chills. Negative for fever.  HENT: Negative for sore throat.   Eyes: Negative for visual disturbance.  Respiratory: Negative for cough and shortness of breath.   Cardiovascular: Positive for chest pain. Negative for palpitations.  Gastrointestinal: Negative for abdominal pain, diarrhea, nausea and vomiting.  Genitourinary: Negative for dysuria and hematuria.  Musculoskeletal: Negative for back pain.  Skin: Negative for color change and rash.  Neurological: Positive for headaches. Negative for seizures and syncope.  All other systems reviewed and are negative.    Physical Exam Updated Vital Signs BP 116/79    Pulse 72    Temp 98.1 F (36.7 C) (Oral)    Resp (!) 8    Ht 6\' 5"  (1.956 m)    Wt 131.5 kg    SpO2 94%    BMI 34.39 kg/m   Physical  Exam Vitals signs and nursing note reviewed.  Constitutional:      Appearance: He  is well-developed.  HENT:     Head: Normocephalic and atraumatic.  Eyes:     Conjunctiva/sclera: Conjunctivae normal.  Neck:     Musculoskeletal: Neck supple.  Cardiovascular:     Rate and Rhythm: Normal rate and regular rhythm.     Heart sounds: Heart sounds are distant. No murmur. No friction rub. No gallop.      Comments: L radial AV fistula with a palpable thrill and bruit ascultated Pulmonary:     Effort: Pulmonary effort is normal. No respiratory distress.     Breath sounds: Normal breath sounds.  Abdominal:     Palpations: Abdomen is soft.     Tenderness: There is no abdominal tenderness.  Skin:    General: Skin is warm and dry.  Neurological:     Mental Status: He is alert.      ED Treatments / Results  Labs (all labs ordered are listed, but only abnormal results are displayed) Labs Reviewed  BASIC METABOLIC PANEL - Abnormal; Notable for the following components:      Result Value   Glucose, Bld 193 (*)    BUN 54 (*)    Creatinine, Ser 10.79 (*)    GFR calc non Af Amer 5 (*)    GFR calc Af Amer 6 (*)    Anion gap 17 (*)    All other components within normal limits  CBC - Abnormal; Notable for the following components:   RBC 4.14 (*)    MCV 101.0 (*)    MCH 35.0 (*)    Platelets 141 (*)    All other components within normal limits  PROTIME-INR - Abnormal; Notable for the following components:   Prothrombin Time 21.0 (*)    INR 1.8 (*)    All other components within normal limits  TROPONIN I (HIGH SENSITIVITY) - Abnormal; Notable for the following components:   Troponin I (High Sensitivity) 153 (*)    All other components within normal limits  SARS CORONAVIRUS 2 BY RT PCR (HOSPITAL ORDER, [place] [facility])  HEPARIN LEVEL (UNFRACTIONATED)  CBC  TROPONIN I (HIGH SENSITIVITY)    EKG EKG Interpretation  Date/Time:  Saturday October 16 2019 14:55:48  EDT Ventricular Rate:  78 PR Interval:    QRS Duration: 120 QT Interval:  430 QTC Calculation: 490 R Axis:   -26 Text Interpretation:  Sinus rhythm Prolonged PR interval Nonspecific intraventricular conduction delay Nonspecific T abnormalities, lateral leads ST elev, probable normal early repol pattern ST changes appear new Confirmed by Malvin Johns [phone]) on 10/16/2019 3:37:05 PM   Radiology Dg Chest Portable 1 View  Result Date: 10/16/2019 CLINICAL DATA:  Acute chest pain EXAM: PORTABLE CHEST 1 VIEW COMPARISON:  06/25/2019 and prior chest radiographs FINDINGS: Cardiomegaly and mild basilar atelectasis noted. A RIGHT IJ central venous catheter is noted with tip overlying the LOWER SVC. There is no evidence of focal airspace disease, pulmonary edema, suspicious pulmonary nodule/mass, pleural effusion, or pneumothorax. No acute bony abnormalities are identified. IMPRESSION: 1. Cardiomegaly with mild bibasilar atelectasis. Electronically Signed   By: Margarette Canada M.D.   On: 10/16/2019 16:06    Procedures Procedures (including critical care time)  Medications Ordered in ED Medications  heparin ADULT infusion 100 units/mL (25000 units/219mL sodium chloride 0.45%) (has no administration in time range)  sodium chloride flush (NS) 0.9 % injection 3 mL (3 mLs Intravenous Given 10/16/19 1514)     Initial Impression / Assessment and Plan / ED Course  I have  reviewed the triage vital signs and the nursing notes.  Pertinent labs & imaging results that were available during my care of the patient were reviewed by me and considered in my medical decision making (see chart for details).  Clinical Course as of Oct 16 1703  Sat Oct 16, 2019  1613 Troponin I (High Sensitivity)(!!): 153 [JL]  1613 Creatinine(!): 10.79 [JL]  1613 Potassium: 4.1 [JL]  1613 INR(!): 1.8 [JL]    Clinical Course User Index [JL] Regan Lemming, MD    HEAR Score: 6  The patient is a 53 year old male with a  complex medical history to include anemia, ascending AAA, CAD, CHF, ESRD on HD (TuThSat via right sided PermCath until his L AV fistula matures), HIV (CD4 368 Oct 2020), HTN, HLD, Mallory Weiss tear, Paroxysmal Afib (On Warfarin), DM2, osteomyelitis of the ankle, OSA who presents to the ED with chest pain.  He endorses typical chest pain that resolved following nitroglycerin administration. He is status post ASA 325mg . He is a HEAR score of 6.  His last cardiac catheterization in January 2020 revealed severe 2 vessel obstructive CAD, with 80% stenosis in the mid LAD, 99% distal LAD and third diag, 100% first diag, 100% first PLOM, 95% ongoing PL branch, 90% RV marginal branch. This cath had showed no change from June 2019. The decision was made to forgo treatment of the mid LAD lesion after consideration of the risks and benefits of stenting.   Ddx: ACS, volume overload from CHF/ESRD, unstable angina. Of note, his last ascending aortic aneurysm measurement was 4.34cm in January 2020. His last EF was 35-40% in Jan 2020. The patient is currently hemodynamically stable, resting comfortably in bed. Doubt ruptured AAA.   EKG with some nonspecific ST segment changes in the anterior leads with no reciprocal changes, poor R wave progression noted.   CXR: Cardiomegaly with bibasilar atelectasis.   Cardiology consulted in the ED and recommended admission for further monitoring. Internal medicine service consulted for admission. The patient was started on heparin in the ED. Additional recommendations include holding coumadin, possible cath depending on labs and pain.   Final Clinical Impressions(s) / ED Diagnoses   Final diagnoses:  Unstable angina pectoris [facility])    ED Discharge Orders    None       Regan Lemming, MD 10/16/19 1705    Malvin Johns, MD 10/16/19 [phone]

## 2019-10-16 NOTE — ED Notes (Signed)
Date and time results received: 10/16/19 1602 (use smartphrase ".now" to insert current time)  Test: trop Critical Value: 153  Name of Provider Notified: Resident   Orders Received? Or Actions Taken?: .

## 2019-10-16 NOTE — Progress Notes (Signed)
ANTICOAGULATION CONSULT NOTE - Initial Consult  Pharmacy Consult for heparin Indication: chest pain/ACS and atrial fibrillation  Allergies  Allergen Reactions  . Oxycodone-Acetaminophen Other (See Comments)    Hallucinations/delirum    Patient Measurements: Height: 6\' 5"  (195.6 cm) Weight: 290 lb (131.5 kg) IBW/kg (Calculated) : 89.1 Heparin Dosing Weight: 117.4kg  Vital Signs: Temp: 98.1 F (36.7 C) (10/17 1456) Temp Source: Oral (10/17 1456) BP: 111/91 (10/17 1530) Pulse Rate: 85 (10/17 1530)  Labs: Recent Labs    10/16/19 1505 10/16/19 1545  HGB 14.5  --   HCT 41.8  --   PLT 141*  --   LABPROT  --  21.0*  INR  --  1.8*  CREATININE 10.79*  --   TROPONINIHS 153*  --     Estimated Creatinine Clearance: 11.9 mL/min (A) (by C-G formula based on SCr of 10.79 mg/dL (H)).   Medical History: Past Medical History:  Diagnosis Date  . Anemia   . Ascending aortic aneurysm ([place])   . CAD (coronary artery disease), native coronary artery    Severe mulitvessel disease  . Chronic combined systolic and diastolic CHF (congestive heart failure) ([place])   . 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 ESRD (end stage renal disease) on dialysis [facility])    [place] [address] T, Th, [place] (10/15/2018)  . Essential hypertension   . Headache   . HIV disease ([place])   . Hyperparathyroidism, secondary ([place])   . Hypertensive heart disease with end stage renal disease on dialysis ([place])   . Mallory-Weiss tear 2018  . Mixed hyperlipidemia   . PAF (paroxysmal atrial fibrillation) ([place])   . Paroxysmal atrial flutter ([place])   . Pneumonia X 1  . QT prolongation   . Subacute osteomyelitis, right ankle and foot ([place])   . SVT (supraventricular tachycardia) ([place])    ? afib or atrial flutter s/p TEE/DCCV with subsequent ablation due to reoccurrence in [place]  . Type 2 diabetes mellitus ([place])   . Wears glasses     Medications:  Infusions:  . heparin      Assessment: 12 yom presented  to the ED with CP. He is on chronic warfarin for afib but INR is subtherapeutic at 1.8. Hgb is WNL but platelets are slightly low. No bleeding noted.   Goal of Therapy:  Heparin level 0.3-0.7 units/ml Monitor platelets by anticoagulation protocol: Yes   Plan:  Heparin gtt 1600 units/hr Check an 8 hr heparin level Daily heparin level and CBC  Thomas [name], Thomas [name] 10/16/2019,4:53 PM

## 2019-10-16 NOTE — Consult Note (Addendum)
Cardiology Consultation:   Patient ID: Anil Claytor MRN: [id number]; DOB: 24-Sep-1966  Admit date: 10/16/2019 Date of Consult: 10/16/2019  Primary Care Provider: [name], MD Primary Cardiologist: Fransico Him, MD  Primary Electrophysiologist:  None    Patient Profile:   Thomas [name] is a 53 y.o. male with a hx of S&D CHF, a flutter on amiodarone/coumadin, CAD with last cath 12/2018 medical management, ESRD on HD, DM, HTN, HLD, Bilateral BKA, HIV, PAD who is being seen today for the evaluation of chest pain at the request of Dr. Tamera Punt.  History of Present Illness:   Mr. Eans with multiple medical issues presents today after having pain at dialysis center. He has significant CAD and last cath 01/25/19, severe 2 vessel obstructive CAD, 80% mLAD, 99% dLAD and 3rd diag, 100% 1st diag. 100% first PLOM, 95% ongoing PL branch and 90% RV marginal branch.  The cath with no significant change since 05/2018.  The only lesion that is amenable to PCI is the mid LAD. This would be a complex PCI with need for atherectomy and stenting due to severe calcification.  It would also require DAPT as well as coumadin increasing bleeding risk.  It was noted that treatment of the lesion would add to his overall prognosis.  On BB, ASA statin.  On that admit he was in a flutter , atypical and on amiodarone, plan was for DCCV, but pt converted back on his own.   He has known dilated cardiomyopathy and severe LVH.Marland Kitchen  Also with ascending aortic aneurysm, at 43 mmhg on last echo, HLD on statin.  In March pt had been doing well without angina.  His volume is managed with dialysis with NICM.    Last Echo 01/24/19 wit EF 35-40%   Today pt had 2 hours of dialysis and had intense sharp stabbing pain, central chest.  He rec'd 324 mg ASA and 2 NTG per EMS.  The second NTG did relief the pain.  At night he has been having some chest discomfort.  Takes his meds, and never misses.   EKG:  The EKG was personally reviewed and  demonstrates:  SR 1st degree AV block at 280 ms, and ST elevation  Telemetry:  Telemetry was personally reviewed and demonstrates:  SR Na 138, K+ 4.1, BUN 54, Cr 10.79 and Troponin 153  Hgb 14.5 plts 141,  INR 1.8   PCXR  IMPRESSION: 1. Cardiomegaly with mild bibasilar atelectasis.  Comfortable in room now.  Heart Pathway Score:  HEAR Score: 6  Past Medical History:  Diagnosis Date  . Anemia   . Ascending aortic aneurysm ([place])   . CAD (coronary artery disease), native coronary artery 01/22/2019  . Chronic combined systolic and diastolic CHF (congestive heart failure) ([place])   . Diabetic foot ulcer ([place]) 09/03/2017  . Diabetic wet gangrene of the foot ([place]) 09/04/2017  . ESRD (end stage renal disease) on dialysis [facility])    [place] [address] [place], [place], [place] (10/15/2018)  . Headache    "monthly" (06/15/2018)  . HIV disease ([place])   . Hyperlipidemia LDL goal <70 01/22/2019  . Hyperparathyroidism, secondary ([place])   . Hypertension   . Hypertensive heart disease with end stage renal disease on dialysis ([place]) 03/13/2017  . Mallory-Weiss tear 2018  . PAF (paroxysmal atrial fibrillation) ([place])   . Paroxysmal atrial flutter ([place])   . Pneumonia X 1  . QT prolongation   . Subacute osteomyelitis, right ankle and foot ([place])   . SVT (supraventricular tachycardia) (  [place])    ? afib or atrial flutter s/p TEE/DCCV with subsequent ablation due to reoccurrence in [place]  . Type 2 diabetes mellitus ([place])   . Wears glasses     Past Surgical History:  Procedure Laterality Date  . AMPUTATION Left    foot  . AMPUTATION Right 12/06/2017   Procedure: AMPUTATION BELOW KNEE;  Surgeon: [name], MD;  Location: [place];  Service: Orthopedics;  Laterality: Right;  . APPLICATION OF WOUND VAC Right 09/04/2017   Procedure: APPLICATION OF WOUND VAC;  Surgeon: [name], DPM;  Location: [place];  Service: Podiatry;  Laterality: Right;  . APPLICATION OF WOUND VAC  12/06/2017   Procedure: APPLICATION OF WOUND VAC;   Surgeon: [name], MD;  Location: [place];  Service: Orthopedics;;  . AV FISTULA PLACEMENT Right 10/19/2012  . AV FISTULA PLACEMENT Left 06/25/2019   Procedure: Arteriovenous (Av) Fistula Creation left arm.;  Surgeon: Angelia Mould, MD;  Location: [place];  Service: Vascular;  Laterality: Left;  . AV FISTULA REPAIR Right ~ 02/2018   "had it cleaned out"  . BELOW KNEE LEG AMPUTATION Left ~ 2016  . COLONOSCOPY WITH PROPOFOL N/A 07/30/2017   Procedure: COLONOSCOPY WITH PROPOFOL;  Surgeon: [name], MD;  Location: [place];  Service: Endoscopy;  Laterality: N/A;  . ESOPHAGOGASTRODUODENOSCOPY (EGD) WITH PROPOFOL N/A 07/28/2017   Procedure: ESOPHAGOGASTRODUODENOSCOPY (EGD) WITH PROPOFOL;  Surgeon: [name], MD;  Location: [place];  Service: Endoscopy;  Laterality: N/A;  . ESOPHAGOGASTRODUODENOSCOPY (EGD) WITH PROPOFOL N/A 01/15/2018   Procedure: ESOPHAGOGASTRODUODENOSCOPY (EGD) WITH PROPOFOL;  Surgeon: [name], MD;  Location: [facility] ENDOSCOPY;  Service: Endoscopy;  Laterality: N/A;  . FLEXIBLE SIGMOIDOSCOPY N/A 07/27/2017   Procedure: FLEXIBLE SIGMOIDOSCOPY;  Surgeon: [name], MD;  Location: [facility] ENDOSCOPY;  Service: Endoscopy;  Laterality: N/A;  . GRAFT APPLICATION Right [id number]   Procedure: SKIN GRAFT APPLICATION RIGHT FOOT;  Surgeon: [name], DPM;  Location: [place];  Service: Podiatry;  Laterality: Right;  . I&D EXTREMITY Right 09/04/2017   Procedure: IRRIGATION AND DEBRIDEMENT EXTREMITY;  Surgeon: [name], DPM;  Location: [place];  Service: Podiatry;  Laterality: Right;  . I&D EXTREMITY Right 11/12/2017   Procedure: IRRIGATION AND DEBRIDEMENT ULCER RIGHT FOOT;  Surgeon: [name], DPM;  Location: [place];  Service: Podiatry;  Laterality: Right;  . INSERTION OF DIALYSIS CATHETER Right 06/25/2019   Procedure: INSERTION OF TUNNELED  DIALYSIS CATHETER Right Internal Jugular.;  Surgeon: Serafina [name], MD;  Location: [place];  Service: Vascular;   Laterality: Right;  . LEFT HEART CATH AND CORONARY ANGIOGRAPHY N/A 06/18/2018   Procedure: LEFT HEART CATH AND CORONARY ANGIOGRAPHY;  Surgeon: [name], Peter M, MD;  Location: [place] CV LAB;  Service: Cardiovascular;  Laterality: N/A;  . LEFT HEART CATH AND CORONARY ANGIOGRAPHY N/A 01/25/2019   Procedure: LEFT HEART CATH AND CORONARY ANGIOGRAPHY;  Surgeon: [name], Peter M, MD;  Location: [place] CV LAB;  Service: Cardiovascular;  Laterality: N/A;  . LIGATION OF ARTERIOVENOUS  FISTULA Right 06/25/2019   Procedure: Ligation Of Arteriovenous  Fistula right arm.;  Surgeon: Angelia Mould, MD;  Location: [place];  Service: Vascular;  Laterality: Right;  . WOUND DEBRIDEMENT N/A 09/24/2017   Procedure: DEBRIDEMENT WOUND;  Surgeon: [name], DPM;  Location: [place];  Service: Podiatry;  Laterality: N/A;     Home Medications:  Prior to Admission medications   Medication Sig Start Date End Date Taking? Authorizing Provider  acetaminophen (TYLENOL) 325 MG tablet Take 325 mg by mouth every 6 (six) hours as needed for moderate pain or headache.    Yes [provider]  amiodarone (PACERONE) 200 MG tablet Take 1 tablet by mouth once daily Patient taking differently: Take 200 mg by mouth daily.  08/18/19  Yes Imogene Burn, PA-C  amoxicillin (AMOXIL) 500 MG capsule Take 500 mg by mouth 2 (two) times daily. 10/05/19  Yes [provider]  aspirin 81 MG chewable tablet Chew 1 tablet (81 mg total) by mouth daily. 01/26/19  Yes Helberg, Larkin Ina, MD  bictegravir-emtricitabine-tenofovir AF (BIKTARVY) 50-200-25 MG TABS tablet Take 1 tablet by mouth daily. 03/10/19  Yes [place] Callas, NP  cinacalcet (SENSIPAR) 90 MG tablet Take 90 mg by mouth daily. 04/27/18  Yes [provider]  ferric citrate (AURYXIA) 1 GM 210 MG(Fe) tablet Take 420-840 mg by mouth See admin instructions. Take 840 mg by mouth three times a day after meals and 420 mg after each snack   Yes [provider]  gabapentin (NEURONTIN) 100 MG capsule Take 1 capsule (100 mg total) by mouth every dialysis. 01/09/18  Yes Jamse Arn, MD  insulin glargine (LANTUS) 100 UNIT/ML injection Inject 30 Units into the skin at bedtime. 01/16/18  Yes [provider]  linagliptin (TRADJENTA) 5 MG TABS tablet Take 5 mg by mouth daily. 01/26/19  Yes [provider]  metoprolol succinate (TOPROL-XL) 100 MG 24 hr tablet TAKE 1 TABLET BY MOUTH ONCE DAILY, TAKE WITH OR IMMEDIATELY FOLLOWING A MEAL Patient taking differently: Take 100 mg by mouth daily. TAKE 1 TABLET BY MOUTH ONCE DAILY TAKE WITH OR IMMEDIATELY FOLLOWING A MEAL 07/05/19  Yes Lyda Jester M, PA-C  nitroGLYCERIN (NITROSTAT) 0.4 MG SL tablet Place 1 tablet (0.4 mg total) under the tongue every 5 (five) minutes as needed for chest pain. 01/29/19  Yes Dorrell, Andree Elk, MD  ranolazine (RANEXA) 500 MG 12 hr tablet Take 1 tablet (500 mg total) by mouth 2 (two) times daily. Patient taking differently: Take 500 mg by mouth daily.  01/29/19  Yes Dorrell, Andree Elk, MD  rosuvastatin (CRESTOR) 20 MG tablet TAKE 1 TABLET BY MOUTH ONCE DAILY AT 6PM Patient taking differently: Take 20 mg by mouth daily.  08/23/19  Yes Helberg, Larkin Ina, MD  Semaglutide (RYBELSUS) 14 MG TABS Take 14 mg by mouth daily. 10/14/19  Yes Helberg, Larkin Ina, MD  traMADol (ULTRAM) 50 MG tablet Take 1 tablet (50 mg total) by mouth every 6 (six) hours as needed. 06/25/19  Yes Angelia Mould, MD  warfarin (COUMADIN) 7.5 MG tablet TAKE 1/2 (ONE-HALF) TABLET BY MOUTH ONCE DAILY EXCEPT 1 TABLET ON MONDAY,WEDNESDAY,AND SATURDAY OR AS DIRECTED BY [place] Patient taking differently: Take 3.75 mg by mouth daily at 6 PM.  07/14/19  Yes Turner, Eber Hong, MD  Accu-Chek FastClix Lancets MISC Check blood sugar 3 times a day 03/05/19   Seawell, Jaimie A, DO  ferric gluconate 125 mg in sodium chloride 0.9 % 100 mL Inject 125 mg into the vein Every Tuesday,Thursday,and  Saturday with dialysis. Patient not taking: Reported on 10/16/2019 05/10/17   Hosie Poisson, MD  glucose blood (ACCU-CHEK GUIDE) test strip Use as instructed to check blood sugar 3 times a day 03/05/19   Seawell, Jaimie A, DO  insulin lispro (HUMALOG) 100 UNIT/ML injection Inject 10 Units into the skin 2 (two) times daily. 01/26/19   [provider]  Insulin Pen Needle 32G X 4 MM MISC Use  to inject both mealtime and long acting insulins up to 3 injections  a day 03/05/19   Seawell, Jaimie A, DO  omega-3 acid ethyl esters (LOVAZA) 1 g capsule Take 2 capsules (2 g total) by mouth 2 (two) times daily. Patient not taking: Reported on 10/16/2019 02/03/19   Sueanne Margarita, MD  pantoprazole (PROTONIX) 40 MG tablet Take 2 tablets (80 mg total) by mouth daily. Patient not taking: Reported on 10/16/2019 02/16/18   Alphonzo Grieve, MD  polycarbophil (FIBERCON) 625 MG tablet Take 1 tablet (625 mg total) by mouth daily. Patient not taking: Reported on 10/16/2019 12/19/17   Angiulli, Lavon Paganini, PA-C  Skin Protectants, Misc. (EUCERIN) cream Apply topically as needed for dry skin. Patient not taking: Reported on 10/16/2019 02/08/19   Welford Roche, MD    Inpatient Medications: Scheduled Meds:  Continuous Infusions:  PRN Meds:   Allergies:    Allergies  Allergen Reactions  . Oxycodone-Acetaminophen Other (See Comments)    Hallucinations/delirum    Social History:   Social History   Socioeconomic History  . Marital status: Married    Spouse name: kim  . Number of children: 3  . Years of education: college  . Highest education level: Not on file  Occupational History  . Occupation: disabled  Social Needs  . Financial resource strain: Not on file  . Food insecurity    Worry: Not on file    Inability: Not on file  . Transportation needs    Medical: Not on file    Non-medical: Not on file  Tobacco Use  . Smoking status: Never Smoker  . Smokeless tobacco: Never Used  Substance  and Sexual Activity  . Alcohol use: Never    Frequency: Never  . Drug use: Never  . Sexual activity: Not Currently  Lifestyle  . Physical activity    Days per week: Not on file    Minutes per session: Not on file  . Stress: Not on file  Relationships  . Social Herbalist on phone: Not on file    Gets together: Not on file    Attends religious service: Not on file    Active member of club or organization: Not on file    Attends meetings of clubs or organizations: Not on file    Relationship status: Not on file  . Intimate partner violence    Fear of current or ex partner: Not on file    Emotionally abused: Not on file    Physically abused: Not on file    Forced sexual activity: Not on file  Other Topics Concern  . Not on file  Social History Narrative   Lives in [place] with wife.    Family History:    Family History  Problem Relation Age of Onset  . Heart disease Mother   . Diabetes Mother   . Heart disease Father   . Cancer Neg Hx      ROS:  Please see the history of present illness.  General:no colds or fevers, no weight changes Skin:no rashes or ulcers HEENT:no blurred vision, no congestion CV:see HPI PUL:see HPI GI:no diarrhea constipation or melena, no indigestion GU:no hematuria, no dysuria MS:no joint pain, no claudication Bil BKA  Neuro:no syncope, no lightheadedness Endo:+ diabetes, no thyroid disease  All other ROS reviewed and negative.     Physical Exam/Data:   Vitals:   10/16/19 1455 10/16/19 1456 10/16/19 1500  BP: 124/80  124/82  Pulse: 79  78  Resp: (!) 24  11  Temp:  98.1 F (36.7 C)   TempSrc:  Oral   SpO2: 96%  97%  Weight:  131.5 kg   Height:  6\' 5"  (1.956 m)     Intake/Output Summary (Last 24 hours) at 10/16/2019 1541 Last data filed at 10/16/2019 1514 Gross per 24 hour  Intake 3 ml  Output -  Net 3 ml   Last 3 Weights 10/16/2019 08/09/2019 06/25/2019  Weight (lbs) 290 lb 283 lb 278 lb 14.1 oz  Weight (kg)  131.543 kg 128.368 kg 126.5 kg     Body mass index is 34.39 kg/m.  General:  Well nourished, well developed, in no acute distress HEENT: normal Lymph: no adenopathy Neck: no JVD Endocrine:  No thryomegaly Vascular: No carotid bruits; dialysis cath rt chest Cardiac:  normal S1, S2; RRR; no murmur gallup rub or click Lungs:  clear to auscultation bilaterally, no wheezing, rhonchi or rales  Abd: soft, nontender, no hepatomegaly  Ext: bil BKA Musculoskeletal:  Moves upper ext - has lower ext prosthesis in place Skin: warm and dry  Neuro:  Alert and oriented X 3, MAE follows commands Psych:  Normal affect    Relevant CV Studies:  Cardiac cath 01/25/89  Prox LAD lesion is 30% stenosed.  Ost 3rd Diag lesion is 95% stenosed.  Mid LAD lesion is 80% stenosed.  Dist LAD lesion is 99% stenosed.  Ost 1st Diag lesion is 100% stenosed.  Acute Mrg lesion is 95% stenosed.  1st RPLB lesion is 100% stenosed.  Post Atrio lesion is 95% stenosed.  LV end diastolic pressure is mildly elevated.   1. Severe 2 vessel obstructive CAD    - 80% mid LAD    - 99% distal LAD and third diagonal    - 100% first diagonal    - 100% first PLOM, 95% ongoing PL branch    - 90% RV marginal branch. 2. Mildly elevated LVEDP 24 mm Hg.  Plan: Compared to June 2019 there is no significant change. The only lesion that is amenable to PCI is the mid LAD. This would be a complex PCI with need for atherectomy and stenting due to severe calcification.  It would also require DAPT as well as coumadin increasing bleeding risk. As noted in prior cath note I don't think treatment of this lesion would add to his overall prognosis. The patient has only had 2 episodes of angina since last June. One occurred last October when he became hypotensive during dialysis. His recent symptoms are clearly related to development of atrial flutter and  his symptoms resolved with restoration of NSR. This would also argue for continued  medical management.   Diagnostic Dominance: Right  I    Laboratory Data:  High Sensitivity Troponin:  No results for input(s): TROPONINIHS in the last 720 hours.   ChemistryNo results for input(s): NA, K, CL, CO2, GLUCOSE, BUN, CREATININE, CALCIUM, GFRNONAA, GFRAA, ANIONGAP in the last 168 hours.  No results for input(s): PROT, ALBUMIN, AST, ALT, ALKPHOS, BILITOT in the last 168 hours. Hematology Recent Labs  Lab 10/11/19 1155 10/16/19 1505  WBC 6.4 8.0  RBC 3.93* 4.14*  HGB 13.5 14.5  HCT 39.2 41.8  MCV 99.7 101.0*  MCH 34.4* 35.0*  MCHC 34.4 34.7  RDW 14.8 14.5  PLT 160 141*   BNPNo results for input(s): BNP, PROBNP in the last 168 hours.  DDimer No results for input(s): DDIMER in the last 168 hours.   Radiology/Studies:  No results  found.  Assessment and Plan:   1. Unstable angina, serial troponin, start IV heparin and hold coumadin. Currently pain free.  May need cardiac cath but hope to treat medically.   2. Significant CAD has not been amenable to intervene, on toprol, ASA, Ranexa, crestor   3. PA flutter, now SR on coumadin and INR 1.8, will hold coumadin and add IV heparin for now for possible cath depending on labs and pain 4. HLD on statin, continue goal LDL <70 or greated 5. ESRD on HD per renal 6. DM on insulin per IM  7. Ascending aortic aneurysm, was 43.4 mm 12/2018 8. BIL BKA with prostheses  9. Dilated cardiomyopathy euvolemic by dialysis. EF 35-40% on Echo in Jan 2020 10. HTN stable.       For questions or updates, please contact [place] Please consult [email or web address] for contact info under     Signed, Cecilie Kicks, NP  10/16/2019 3:41 PM    Attending note:  Patient seen and examined.  I reviewed his records and discussed the case with Ms. Dorene Ar NP, I agree with her above findings.  Mr. Bate presents to the ER from a dialysis session via EMS due to the development of chest pain about 1 hour into his treatment.  He reports a  pressure like discomfort, did not resolve with discontinuing hemodialysis and oxygen.  He reports that his blood pressure had not dropped as sometimes happens toward the end of his sessions.  He states that he has not had chest pain of this severity previously, does report having nocturnal chest pain within the last week or so.  He states that he has been compliant with all of his medications and also hemodialysis sessions.  Cardiac catheterization from January of this year revealed severe multivessel disease with limited revascularization options.  Only amenable lesion was within the mid LAD but would be a complex procedure requiring atherectomy and stent intervention.  He has been managed medically.  Sublingual nitroglycerin ultimately led to resolution of his chest pain.  On examination in the ER he is chest pain-free.  He is afebrile, heart rate in the 70s in sinus rhythm by telemetry which I personally reviewed.  Systolic blood pressure [id number].  Lungs are clear without labored breathing.  Cardiac exam reveals RRR without gallop.  He is status post bilateral BKA's with prostheses in place.  Lab work shows potassium 4.1, BUN 54, creatinine 10.79, high-sensitivity troponin I 153, hemoglobin 14.5, platelets 141, INR 1.8.  ECG shows sinus rhythm and nondiagnostic ST segment elevation in anterolateral leads.  Chest x-ray reports cardiomegaly with mild bibasilar atelectasis.  Patient presents with symptoms concerning for unstable angina, recent nocturnal chest discomfort and a more severe episode that occurred during hemodialysis session.  Initial high-sensitivity troponin I level is increased although still not necessarily diagnostic for ACS in the setting of ESRD.  ECG is abnormal, he is chest pain-free at this time however.  Plan is for him to be admitted to the internal medicine service with follow-up by nephrology for ongoing hemodialysis.  Withhold Coumadin and initiate heparin per pharmacy.   Outpatient cardiac regimen includes aspirin, amiodarone, Toprol-XL, Lovaza, Crestor, Coumadin and Ranexa.  Continue to cycle cardiac enzymes and follow-up ECG tomorrow.  A repeat diagnostic cardiac catheterization may well need to be considered, however it would appear that he has fairly limited revascularization options.  Satira Sark, M.D., F.A.C.C.

## 2019-10-16 NOTE — H&P (Signed)
Date: 10/16/2019               Patient Name:  Thomas [name] MRN: [id number]  DOB: 01-14-1966 Age / Sex: 53 y.o., male   PCP: [name], MD         Medical Service: Internal Medicine Teaching Service         Attending Physician: Dr. Evette Doffing, Mallie Mussel, *    First Contact: Dr. Gilford Rile Pager: [phone]  Second Contact: Dr. Myrtie Hawk Pager: [phone]       After Hours (After 5p/  First Contact Pager: [phone]  weekends / holidays): Second Contact Pager: [phone]   Chief Complaint: Chest Pain  History of Present Illness:  [name] is a 53 y/o male with a significant PMHx of AAA, HIV, HTN, CHF, CAD, T2DM, PAF, and HLD who presents to [facility] with chest pain. The history was obtained via the patient, his wife, and through chart review.   Thomas [name] was in his usual state of health until this afternoon when he began to experience substernal chest pain while getting HD. He was about 2 hours into HD when he developed substernal chest pressure. He describes it as someone standing on his chest. He denies radiation of the pain/pressure. He does endorse a positional component stating that it was better sitting upright/leaning forward. EMS was called and he was given ASA and sublingual nitroglycerin. His pain was initailly a 10/10 but after 2 sublingual nitroglycerinins it decreased to a 7/10 and by the time he arrived in the ED it had completely abated. He denies N/V, diaphoresis, SHOB, abdominal pain, cough, recent viral illness, DOE, exertional chest pain in the proceeding days. He does endorse nocturnal chest pain the last several days. He states that his blood pressure did not drop during HD and that his CP did not resolve/change with discontinuation of HD.   On review of records the patient has a significant cardiac history with known CAD. His last cardiac cath was on 01/25/2019. It illustrated multivessel disease unchanged compared to prior with the only amendable lesion being in the  mid LAD (full report below). Intervention on the lesion would be complex and cardiology did not think it would change the patient overall prognosis. Therefore it was recommended that he continue medical management.   Meds:  Current Meds  Medication Sig  . acetaminophen (TYLENOL) 325 MG tablet Take 325 mg by mouth every 6 (six) hours as needed for moderate pain or headache.   Marland Kitchen amiodarone (PACERONE) 200 MG tablet Take 1 tablet by mouth once daily (Patient taking differently: Take 200 mg by mouth daily. )  . amoxicillin (AMOXIL) 500 MG capsule Take 500 mg by mouth 2 (two) times daily.  Marland Kitchen aspirin 81 MG chewable tablet Chew 1 tablet (81 mg total) by mouth daily.  . bictegravir-emtricitabine-tenofovir AF (BIKTARVY) 50-200-25 MG TABS tablet Take 1 tablet by mouth daily.  . cinacalcet (SENSIPAR) 90 MG tablet Take 90 mg by mouth daily.  . ferric citrate (AURYXIA) 1 GM 210 MG(Fe) tablet Take 420-840 mg by mouth See admin instructions. Take 840 mg by mouth three times a day after meals and 420 mg after each snack  . gabapentin (NEURONTIN) 100 MG capsule Take 1 capsule (100 mg total) by mouth every dialysis.  Marland Kitchen insulin glargine (LANTUS) 100 UNIT/ML injection Inject 30 Units into the skin at bedtime.  Marland Kitchen linagliptin (TRADJENTA) 5 MG TABS tablet Take 5 mg by mouth daily.  . metoprolol succinate (TOPROL-XL) 100 MG  24 hr tablet TAKE 1 TABLET BY MOUTH ONCE DAILY, TAKE WITH OR IMMEDIATELY FOLLOWING A MEAL (Patient taking differently: Take 100 mg by mouth daily. TAKE 1 TABLET BY MOUTH ONCE DAILY TAKE WITH OR IMMEDIATELY FOLLOWING A MEAL)  . nitroGLYCERIN (NITROSTAT) 0.4 MG SL tablet Place 1 tablet (0.4 mg total) under the tongue every 5 (five) minutes as needed for chest pain.  . ranolazine (RANEXA) 500 MG 12 hr tablet Take 1 tablet (500 mg total) by mouth 2 (two) times daily. (Patient taking differently: Take 500 mg by mouth daily. )  . rosuvastatin (CRESTOR) 20 MG tablet TAKE 1 TABLET BY MOUTH ONCE DAILY AT 6PM  (Patient taking differently: Take 20 mg by mouth daily. )  . Semaglutide (RYBELSUS) 14 MG TABS Take 14 mg by mouth daily.  . traMADol (ULTRAM) 50 MG tablet Take 1 tablet (50 mg total) by mouth every 6 (six) hours as needed.  . warfarin (COUMADIN) 7.5 MG tablet TAKE 1/2 (ONE-HALF) TABLET BY MOUTH ONCE DAILY EXCEPT 1 TABLET ON MONDAY,WEDNESDAY,AND SATURDAY OR AS DIRECTED BY ANTICOAGULATIN CLINIC (Patient taking differently: Take 3.75 mg by mouth daily at 6 PM. )   Allergies: Allergies as of 10/16/2019 - Review Complete 10/16/2019  Allergen Reaction Noted  . Oxycodone-acetaminophen Other (See Comments) 01/13/2018   Past Medical History:  Diagnosis Date  . Anemia   . Ascending aortic aneurysm ([place])   . CAD (coronary artery disease), native coronary artery    Severe mulitvessel disease  . Chronic combined systolic and diastolic CHF (congestive heart failure) ([place])   . Diabetic wet gangrene of the foot ([place]) 09/04/2017  . ESRD (end stage renal disease) on dialysis [facility])    [place] [address] T, [place], [place] (10/15/2018)  . Essential hypertension   . Headache   . HIV disease ([place])   . Hyperparathyroidism, secondary ([place])   . Hypertensive heart disease with end stage renal disease on dialysis ([place])   . Mallory-Weiss tear 2018  . Mixed hyperlipidemia   . PAF (paroxysmal atrial fibrillation) ([place])   . Paroxysmal atrial flutter ([place])   . Pneumonia X 1  . QT prolongation   . Subacute osteomyelitis, right ankle and foot ([place])   . SVT (supraventricular tachycardia) ([place])    ? afib or atrial flutter s/p TEE/DCCV with subsequent ablation due to reoccurrence in [place]  . Type 2 diabetes mellitus ([place])   . 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 He enjoys traveling but unfortunately HD has limited his ability to do so. He also enjoys sports, particularly  basketball and football. He is a Public house manager. He lives with his wife and has a 36 year old son.   He is a never smoker or nicotine user.  He has never used illicit substances.  He denies frequent use of EtOH.   Review of Systems: A complete ROS was negative except as per HPI.   Physical Exam: Blood pressure (!) 134/95, pulse 70, temperature 98.1 F (36.7 C), temperature source Oral, resp. rate 19, height 6\' 5"  (1.956 m), weight 131.5 kg, SpO2 96 %.  General: Obese male in no acute distress HENT: Normocephalic, atraumatic, moist mucus membranes Pulm: Good air movement with no wheezing or crackles  CV: RRR, no murmurs, no rubs, no JVD, right temp IJ cath in place  Abdomen: Active bowel sounds, soft,  non-distended, no tenderness to palpation  Extremities: Palpable thrill in the LUE, bilateral above the knee amputation, left ring finger without cap refill and cool compared to prior.   Skin: Warm and dry  Neuro: Alert and oriented x 3  EKG: personally reviewed my interpretation: is sinus rhythm with left axis deviation. PR prolongation but no other conduction abnormalities. Slight anterolateral ST segment elevation but no ST depression or PR depression.   CXR: Good inspiration. Slightly rotated and under penetrated. Right IJ in place. No other bony or soft tissue abnormalities. Blunting of the costovertebral angles bilaterally. Bilateral infiltrated/vascular congestion.   LHC 01/25/2019  Prox LAD lesion is 30% stenosed.  Ost 3rd Diag lesion is 95% stenosed.  Mid LAD lesion is 80% stenosed.  Dist LAD lesion is 99% stenosed.  Ost 1st Diag lesion is 100% stenosed.  Acute Mrg lesion is 95% stenosed.  1st RPLB lesion is 100% stenosed.  Post Atrio lesion is 95% stenosed.  LV end diastolic pressure is mildly elevated.   1. Severe 2 vessel obstructive CAD    - 80% mid LAD    - 99% distal LAD and third diagonal    - 100% first diagonal    - 100% first PLOM, 95% ongoing PL branch     - 90% RV marginal branch. 2. Mildly elevated LVEDP 24 mm Hg.  Plan: Compared to June 2019 there is no significant change. The only lesion that is amenable to PCI is the mid LAD. This would be a complex PCI with need for atherectomy and stenting due to severe calcification.  It would also require DAPT as well as coumadin increasing bleeding risk. As noted in prior cath note I don't think treatment of this lesion would add to his overall prognosis. The patient has only had 2 episodes of angina since last June. One occurred last October when he became hypotensive during dialysis. His recent symptoms are clearly related to development of atrial flutter and  his symptoms resolved with restoration of NSR. This would also argue for continued medical management.   Assessment & Plan by Problem: Active Problems:   Unstable angina [facility])  Thomas [name] is a 53 y/o male with a significant PMHx of AAA, HIV, HTN, CHF, CAD, T2DM, PAF, and HLD who presents to [facility] with typical chest pain. Initial EKG was without evident of acute ischemia and HS-troponin was minimally elevated to 153. Cardiology was consulted and the patient was admitted for further evaluation and management of his chest pain.   [place]:  Hx of severe 2 vessel obstructive CAD on cath 12/2018.  Presented with typical chest pain during HD. No hypotension or tachy-or bradycardia reported. High intensity Trop 153. ekg nondiagnostic ST elevation in anterolateral leads.  Pain almost resolved with second nitro. Cards saw the patient. Thinks that elevated trop is likeley in setting of ESRD, started IV Hep.  -Continue ASA 81 mg QD -Continue IV Heparin -Continue home Crestor -Continue home dose of Toprol - Trending trop.  -Cardiac monitoring -Cardiology is on board, appreciate recommendation if patient needs further evaluation by cath -We will keep n.p.o. tonight  -EKG tomorrow morning  Paroxysmal A. Fib: Currently sinus -Hold home anticoagulation  while on IV heparin -Continue home dose of amiodarone -Continue home dose of Toprol  ESRD on HD TTS: Only got an hour of HD today due to c.p. stable.  -No indication for urgent HD now - Should consult nephro tomorrow for next HD   DM: On insulin at home,  giving SSI tonight while npo. May add lantus tomorrow if needed -CBG monitoring -SSI   Dispo: Admit patient to Inpatient with expected length of stay greater than 2 midnights.  Diet: npo IV fluid: None VTE ppx: IV heparin Code status: Full  Signed: Dewayne Hatch, MD 10/16/2019, 8:15 PM  Pager: [phone]

## 2019-10-16 NOTE — ED Triage Notes (Signed)
Pt here from dialysis. Pt was in dialysis for an hour an started to experience intense sharp stabbing central chest pain 10/10. EMS gave 324 of aspirin and 2 nitro. Pt rates pain 7/10.

## 2019-10-17 DIAGNOSIS — Z89611 Acquired absence of right leg above knee: Secondary | ICD-10-CM

## 2019-10-17 DIAGNOSIS — I25110 Atherosclerotic heart disease of native coronary artery with unstable angina pectoris: Principal | ICD-10-CM

## 2019-10-17 DIAGNOSIS — Z7982 Long term (current) use of aspirin: Secondary | ICD-10-CM

## 2019-10-17 DIAGNOSIS — Z79899 Other long term (current) drug therapy: Secondary | ICD-10-CM

## 2019-10-17 DIAGNOSIS — Z7901 Long term (current) use of anticoagulants: Secondary | ICD-10-CM

## 2019-10-17 DIAGNOSIS — Z794 Long term (current) use of insulin: Secondary | ICD-10-CM

## 2019-10-17 DIAGNOSIS — L989 Disorder of the skin and subcutaneous tissue, unspecified: Secondary | ICD-10-CM

## 2019-10-17 DIAGNOSIS — Z89612 Acquired absence of left leg above knee: Secondary | ICD-10-CM

## 2019-10-17 DIAGNOSIS — E11628 Type 2 diabetes mellitus with other skin complications: Secondary | ICD-10-CM

## 2019-10-17 DIAGNOSIS — E1122 Type 2 diabetes mellitus with diabetic chronic kidney disease: Secondary | ICD-10-CM

## 2019-10-17 LAB — BASIC METABOLIC PANEL
Anion gap: 18 — ABNORMAL HIGH (ref 5–15)
BUN: 66 mg/dL — ABNORMAL HIGH (ref 6–20)
CO2: 19 mmol/L — ABNORMAL LOW (ref 22–32)
Calcium: 9.1 mg/dL (ref 8.9–10.3)
Chloride: 99 mmol/L (ref 98–111)
Creatinine, Ser: 12.09 mg/dL — ABNORMAL HIGH (ref 0.61–1.24)
GFR calc Af Amer: 5 mL/min — ABNORMAL LOW (ref >60)
GFR calc non Af Amer: 4 mL/min — ABNORMAL LOW (ref >60)
Glucose, Bld: 240 mg/dL — ABNORMAL HIGH (ref 70–99)
Potassium: 3.7 mmol/L (ref 3.5–5.1)
Sodium: 136 mmol/L (ref 135–145)

## 2019-10-17 LAB — HEPARIN LEVEL (UNFRACTIONATED)
Heparin Unfractionated: 0.2 IU/mL — ABNORMAL LOW (ref 0.30–0.70)
Heparin Unfractionated: 0.28 IU/mL — ABNORMAL LOW (ref 0.30–0.70)
Heparin Unfractionated: 0.37 IU/mL (ref 0.30–0.70)

## 2019-10-17 LAB — CBC
HCT: 38.3 % — ABNORMAL LOW (ref 39.0–52.0)
Hemoglobin: 13.4 g/dL (ref 13.0–17.0)
MCH: 35.4 pg — ABNORMAL HIGH (ref 26.0–34.0)
MCHC: 35 g/dL (ref 30.0–36.0)
MCV: 101.1 fL — ABNORMAL HIGH (ref 80.0–100.0)
Platelets: 140 10*3/uL — ABNORMAL LOW (ref 150–400)
RBC: 3.79 MIL/uL — ABNORMAL LOW (ref 4.22–5.81)
RDW: 14.5 % (ref 11.5–15.5)
WBC: 5.4 10*3/uL (ref 4.0–10.5)
nRBC: 0 % (ref 0.0–0.2)

## 2019-10-17 LAB — GLUCOSE, CAPILLARY
Glucose-Capillary: 167 mg/dL — ABNORMAL HIGH (ref 70–99)
Glucose-Capillary: 159 mg/dL — ABNORMAL HIGH (ref 70–99)
Glucose-Capillary: 181 mg/dL — ABNORMAL HIGH (ref 70–99)
Glucose-Capillary: 221 mg/dL — ABNORMAL HIGH (ref 70–99)
Glucose-Capillary: 218 mg/dL — ABNORMAL HIGH (ref 70–99)

## 2019-10-17 LAB — TROPONIN I (HIGH SENSITIVITY)
Troponin I (High Sensitivity): 522 ng/L (ref <18)
Troponin I (High Sensitivity): 686 ng/L (ref <18)
Troponin I (High Sensitivity): 580 ng/L (ref <18)

## 2019-10-17 MED ORDER — CHLORHEXIDINE GLUCONATE CLOTH 2 % EX PADS: 6.0000 | MEDICATED_PAD | Freq: Every day | CUTANEOUS | Status: DC

## 2019-10-17 NOTE — Progress Notes (Signed)
ANTICOAGULATION CONSULT NOTE - Follow Up Consult  Pharmacy Consult for heparin Indication: chest pain/ACS  Allergies  Allergen Reactions  . Oxycodone-Acetaminophen Other (See Comments)    Hallucinations/delirum    Patient Measurements: Height: 6\' 5"  (195.6 cm) Weight: 278 lb 3.2 oz (126.2 kg)(scale a) IBW/kg (Calculated) : 89.1 Heparin Dosing Weight: 117 kg  Vital Signs: Temp: 98.4 F (36.9 C) (10/18 0739) Temp Source: Oral (10/18 0739) BP: 117/91 (10/18 0739) Pulse Rate: 72 (10/18 0739)  Labs: Recent Labs    10/16/19 1505 10/16/19 1545 10/16/19 2255 10/17/19 0126 10/17/19 0541 10/17/19 1027  HGB 14.5  --   --  13.4  --   --   HCT 41.8  --   --  38.3*  --   --   PLT 141*  --   --  140*  --   --   LABPROT  --  21.0*  --   --   --   --   INR  --  1.8*  --   --   --   --   HEPARINUNFRC  --   --   --  0.20*  --  0.28*  CREATININE 10.79*  --   --  12.09*  --   --   TROPONINIHS 153*  --  522*  --  686*  --     Estimated Creatinine Clearance: 10.4 mL/min (A) (by C-G formula based on SCr of 12.09 mg/dL (H)).   Medications:  Scheduled:  . amiodarone  200 mg Oral Daily  . amoxicillin  500 mg Oral BID  . aspirin  81 mg Oral Daily  . bictegravir-emtricitabine-tenofovir AF  1 tablet Oral Daily  . Chlorhexidine Gluconate Cloth  6 each Topical Daily  . [START ON 10/19/2019] gabapentin  100 mg Oral Q T,Th,Sat-1800  . insulin aspart  0-9 Units Subcutaneous Q4H  . metoprolol succinate  100 mg Oral Daily  . ranolazine  500 mg Oral Daily  . rosuvastatin  20 mg Oral Daily   Infusions:  . heparin 1,800 Units/hr (10/17/19 0920)    Assessment: 25 yom presented with unstable angina, hx of PAF on warfarin PTA (home dose: 3.75 mg daily). Started on heparin infusion d/t sub-therapeutic INR of 1.8 at admission and holding warfarin due to possible cath 10/19. Positive trops.   Heparin level overnight was subtherapeutic and the dose was increased to 1800 units/hour. Follow up  heparin level today was delayed due to patient refusing lab draw, but was eventually drawn and is still subtherapeutic at 0.28. H&H has decreased but is still relatively normal at 13.4/38.3, plts are low at 140.   Given how close the heparin level is to therapeutic range and CBC on low end of normal, will not bolus heparin, but will increase infusion  Goal of Therapy:  Heparin level 0.3-0.7 units/ml Monitor platelets by anticoagulation protocol: Yes   Plan:  Increase heparin infusion to 2050 units/hr Check anti-Xa level in 8 hours and daily while on heparin Continue to monitor H&H and platelets Monitor signs of bleeding   Thank you,   Eddie Candle, PharmD PGY-1 Pharmacy Resident   Please check amion for clinical pharmacist contact number

## 2019-10-17 NOTE — Progress Notes (Addendum)
   Subjective:  Thomas [name] was seen at bedside this morning. He states that he is doing well, and that his chest pain has completely resolved. We spoke to him about reaching out to nephrology today to see if he would be a candidate for dialysis here. We spoke about his labs and continual medical workup. He was agreeable to the plan. All questions and concerns were addressed.   Objective:  Vital signs in last 24 hours: Vitals:   10/16/19 2024 10/17/19 0104 10/17/19 0652 10/17/19 0739  BP: 128/86 118/80 116/88 (!) 117/91  Pulse: 66 78 73 72  Resp: 16 16 16    Temp: 98 F (36.7 C) 97.7 F (36.5 C) 98.4 F (36.9 C) 98.4 F (36.9 C)  TempSrc: Oral Oral Oral Oral  SpO2: 100% 97% 98% 97%  Weight: 126.3 kg  126.2 kg   Height:       Physical Exam Constitutional:      General: He is not in acute distress.    Appearance: He is well-developed. He is not ill-appearing, toxic-appearing or diaphoretic.  HENT:     Head: Normocephalic and atraumatic.  Cardiovascular:     Rate and Rhythm: Normal rate and regular rhythm.     Heart sounds: Normal heart sounds. No murmur. No friction rub. No gallop.   Pulmonary:     Effort: Pulmonary effort is normal. No tachypnea.     Breath sounds: Normal breath sounds. No decreased breath sounds, wheezing or rhonchi.  Chest:     Chest wall: No tenderness.  Abdominal:     General: Bowel sounds are normal.     Palpations: Abdomen is soft.  Musculoskeletal:     Comments: Above the knee amputations bilaterally. Palpable thrill in the left upper extremity which is maturing into an AV fistula.   Neurological:     Mental Status: He is alert and oriented to person, place, and time.     Assessment/Plan:  Active Problems:   Unstable angina (HCC)  Unstable Angina:  Patient came in for Chest pain during a hemodialysis session. He states that it felt like someone was putting their foot down on his chest. Patient states that the pain was alleviated with two  nitroglycerin doses. Patient states that today his chest pain has resolved. His morning EKG showed no change compared to yesterday.  - Troponins: 686 <- 522<-153 - Continue ASA 81 mg QD - Continue Cardiac monitoring - Continue IV Heparin - Continue  Crestor 20 mg QD - Continue Toprol 100 mg QD - EKG: sinus rhythm with prolonged PR interval and ST changes in the anterolateral leads  Paroxysmal A. Fib: Patient with a PMHx of Paroxysmal A. Fib. He is currently rate controlled. We will continue to keep him on his home medications and anticoagulate with IV heparin.  - Hold home anticoagulation while on IV heparin - Continue home dose of amiodarone - Continue home dose of Toprol  Distal L Ring Finger Lesion:  - Continue amoxicillin.   ESRD: Patient's HD schedule is Tuesday, Thursday, Saturday. He only completed one hour of his Saturday session before being admitted to [facility]. Nephrology was consulted to start his HD during his stay.   - Consulted with Nephrology, they will see Thomas [name]. We appreciate their assistance.   DM: On insulin at home, giving SSI tonight while npo. May add lantus tomorrow if needed -CBG monitoring -SSI   Dispo: Anticipated discharge pending medical course.   [name], MD 10/17/2019, 8:05 AM Pager: [phone]

## 2019-10-17 NOTE — Progress Notes (Signed)
[place] for heparin Indication: chest pain/ACS and atrial fibrillation  Allergies  Allergen Reactions  . Oxycodone-Acetaminophen Other (See Comments)    Hallucinations/delirum    Patient Measurements: Height: 6\' 5"  (195.6 cm) Weight: 278 lb 8 oz (126.3 kg)(scale a) IBW/kg (Calculated) : 89.1 Heparin Dosing Weight: 117.4kg  Vital Signs: Temp: 97.7 F (36.5 C) (10/18 0104) Temp Source: Oral (10/18 0104) BP: 118/80 (10/18 0104) Pulse Rate: 78 (10/18 0104)  Labs: Recent Labs    10/16/19 1505 10/16/19 1545 10/16/19 2255 10/17/19 0126  HGB 14.5  --   --  13.4  HCT 41.8  --   --  38.3*  PLT 141*  --   --  140*  LABPROT  --  21.0*  --   --   INR  --  1.8*  --   --   HEPARINUNFRC  --   --   --  0.20*  CREATININE 10.79*  --   --  12.09*  TROPONINIHS 153*  --  522*  --     Estimated Creatinine Clearance: 10.4 mL/min (A) (by C-G formula based on SCr of 12.09 mg/dL (H)).   Medical History: Past Medical History:  Diagnosis Date  . Anemia   . Ascending aortic aneurysm ([place])   . CAD (coronary artery disease), native coronary artery    Severe mulitvessel disease  . Chronic combined systolic and diastolic CHF (congestive heart failure) ([place])   . Diabetic wet gangrene of the foot ([place]) 09/04/2017  . ESRD (end stage renal disease) on dialysis [facility])    [place] [address] [place], [place], [place] (10/15/2018)  . Essential hypertension   . Headache   . HIV disease ([place])   . Hyperparathyroidism, secondary ([place])   . Hypertensive heart disease with end stage renal disease on dialysis ([place])   . Mallory-Weiss tear 2018  . Mixed hyperlipidemia   . PAF (paroxysmal atrial fibrillation) ([place])   . Paroxysmal atrial flutter ([place])   . Pneumonia X 1  . QT prolongation   . Subacute osteomyelitis, right ankle and foot ([place])   . SVT (supraventricular tachycardia) ([place])    ? afib or atrial flutter s/p TEE/DCCV with subsequent ablation due to reoccurrence in [place]   . Type 2 diabetes mellitus ([place])   . Wears glasses     Medications:  Infusions:  . heparin 1,600 Units/hr (10/16/19 1709)    Assessment: 47 yom presented to the ED with CP. He is on chronic warfarin for afib but INR is subtherapeutic at 1.8. Hgb is WNL but platelets are slightly low. No bleeding noted.  Heparin level is 0.2 units/ml  Goal of Therapy:  Heparin level 0.3-0.7 units/ml Monitor platelets by anticoagulation protocol: Yes   Plan:  Increase Heparin gtt 1800 units/hr Check a 6 hr heparin level Daily heparin level and CBC  Janith Nielson Poteet 10/17/2019,2:49 AM

## 2019-10-17 NOTE — Progress Notes (Signed)
Internal Medicine Teaching Service Attending:   I saw and examined the patient. I reviewed the resident's note and I agree with the resident's findings and plan as documented in the resident's note.  Principal Problem:   Unstable angina (HCC) Active Problems:   ESRD on dialysis ([place])   Type 2 diabetes mellitus with complication (HCC)   PAF (paroxysmal atrial fibrillation) (HCC)   CAD (coronary artery disease), native coronary artery   Hospital day #2 for this 53 year old man living with end-stage renal disease on hemodialysis admitted with episode of unstable angina during HD session on Saturday.  Currently he is chest pain-free, troponin elevated to 686, EKG stable with nonspecific ST changes.  He has a history of complex multivessel ischemic artery disease with last catheterization in January 2020.  Currently we are medically managing the unstable angina with aspirin, IV heparin, Crestor, and metoprolol.  Greatly appreciate cardiology consultation, he is limited PCI options so we are favoring medical management at this point.  We will see how he tolerates inpatient HD session, if he has further episodes of angina may have to pursue an attempt at PCI.  Lalla Brothers, MD FACP

## 2019-10-17 NOTE — Consult Note (Addendum)
[facility] Renal Consultation Note    Indication for Consultation:  Management of ESRD/hemodialysis; anemia, hypertension/volume and secondary hyperparathyroidism PCP: Dr. [name] Cardiologist-Dr. Fransico Him Nephrologist-Dr. Lorrene Reid  HPI: Thomas [name] is a 53 y.o. male with ESRD on hemodialysis T,Th,S at [facility]. PMH: DM/HTN, HIV, PAF on coumadin, CAD (Hx of obstructive 2 vessel CAD)  PVD S/P bilateral BKA, combined systolic and diastolic HF last EF [id number], ascending aortic aneurysm, dilated cardiomyopathy, AOCD, SHPT.  Last HD 10/16/19 ran 2 hour treatment D/T machine clotting and development of chest pain. Has not been to OP EDW in past 5 treatments. H/O high IDWG but otherwise usually compliant with HD Rx.   Patient states he was having his usual treatment yesterday and blood system clotted. While waiting to treatment to resume he developed severe substernal chest pain which was relieved with 2 SL NTG given per EMS (patient did not have NTG tablets with him.) Says he has had intermittent chest pain over past two weeks but nothing as severe as this pain. Upon arrival to ED, EKG nonspecific t wave changes, new ST elevation. Labs unremarkable for HD patient. CXR with cardiomegaly with mild bibasilar atelectasis. He was started on heparin drip, cardiology consulted. He has been admitted for unstable angina. No further C/O chest pain since admit. He is alert, oriented, very congenial. Says that he has gained weight and believes his OP EDW needs to be adjusted.   Past Medical History:  Diagnosis Date  . Anemia   . Ascending aortic aneurysm ([place])   . CAD (coronary artery disease), native coronary artery    Severe mulitvessel disease  . Chronic combined systolic and diastolic CHF (congestive heart failure) ([place])   . Diabetic wet gangrene of the foot ([place]) 09/04/2017  . ESRD (end stage renal disease) on dialysis [facility])    [place] [address] T, Th, [place]  (10/15/2018)  . Essential hypertension   . Headache   . HIV disease ([place])   . Hyperparathyroidism, secondary ([place])   . Hypertensive heart disease with end stage renal disease on dialysis ([place])   . Mallory-Weiss tear 2018  . Mixed hyperlipidemia   . PAF (paroxysmal atrial fibrillation) ([place])   . Paroxysmal atrial flutter ([place])   . Pneumonia X 1  . QT prolongation   . Subacute osteomyelitis, right ankle and foot ([place])   . SVT (supraventricular tachycardia) ([place])    ? afib or atrial flutter s/p TEE/DCCV with subsequent ablation due to reoccurrence in [place]  . Type 2 diabetes mellitus ([place])   . Wears glasses    Past Surgical History:  Procedure Laterality Date  . AMPUTATION Left    foot  . AMPUTATION Right 12/06/2017   Procedure: AMPUTATION BELOW KNEE;  Surgeon: [name], MD;  Location: [place];  Service: Orthopedics;  Laterality: Right;  . APPLICATION OF WOUND VAC Right 09/04/2017   Procedure: APPLICATION OF WOUND VAC;  Surgeon: [name], DPM;  Location: [place];  Service: Podiatry;  Laterality: Right;  . APPLICATION OF WOUND VAC  12/06/2017   Procedure: APPLICATION OF WOUND VAC;  Surgeon: [name], MD;  Location: [place];  Service: Orthopedics;;  . AV FISTULA PLACEMENT Right 10/19/2012  . AV FISTULA PLACEMENT Left 06/25/2019   Procedure: Arteriovenous (Av) Fistula Creation left arm.;  Surgeon: Angelia Mould, MD;  Location: [place];  Service: Vascular;  Laterality: Left;  . AV FISTULA REPAIR Right ~ 02/2018   "had it cleaned out"  .  BELOW KNEE LEG AMPUTATION Left ~ 2016  . COLONOSCOPY WITH PROPOFOL N/A 07/30/2017   Procedure: COLONOSCOPY WITH PROPOFOL;  Surgeon: [name], MD;  Location: [place];  Service: Endoscopy;  Laterality: N/A;  . ESOPHAGOGASTRODUODENOSCOPY (EGD) WITH PROPOFOL N/A 07/28/2017   Procedure: ESOPHAGOGASTRODUODENOSCOPY (EGD) WITH PROPOFOL;  Surgeon: [name], MD;  Location: [place];  Service: Endoscopy;  Laterality: N/A;  .  ESOPHAGOGASTRODUODENOSCOPY (EGD) WITH PROPOFOL N/A 01/15/2018   Procedure: ESOPHAGOGASTRODUODENOSCOPY (EGD) WITH PROPOFOL;  Surgeon: [name], MD;  Location: [facility] ENDOSCOPY;  Service: Endoscopy;  Laterality: N/A;  . FLEXIBLE SIGMOIDOSCOPY N/A 07/27/2017   Procedure: FLEXIBLE SIGMOIDOSCOPY;  Surgeon: [name], MD;  Location: [facility] ENDOSCOPY;  Service: Endoscopy;  Laterality: N/A;  . GRAFT APPLICATION Right [id number]   Procedure: SKIN GRAFT APPLICATION RIGHT FOOT;  Surgeon: [name], DPM;  Location: [place];  Service: Podiatry;  Laterality: Right;  . I&D EXTREMITY Right 09/04/2017   Procedure: IRRIGATION AND DEBRIDEMENT EXTREMITY;  Surgeon: [name], DPM;  Location: [place];  Service: Podiatry;  Laterality: Right;  . I&D EXTREMITY Right 11/12/2017   Procedure: IRRIGATION AND DEBRIDEMENT ULCER RIGHT FOOT;  Surgeon: [name], DPM;  Location: [place];  Service: Podiatry;  Laterality: Right;  . INSERTION OF DIALYSIS CATHETER Right 06/25/2019   Procedure: INSERTION OF TUNNELED  DIALYSIS CATHETER Right Internal Jugular.;  Surgeon: Serafina [name], MD;  Location: [place];  Service: Vascular;  Laterality: Right;  . LEFT HEART CATH AND CORONARY ANGIOGRAPHY N/A 06/18/2018   Procedure: LEFT HEART CATH AND CORONARY ANGIOGRAPHY;  Surgeon: [name], Peter M, MD;  Location: [place] CV LAB;  Service: Cardiovascular;  Laterality: N/A;  . LEFT HEART CATH AND CORONARY ANGIOGRAPHY N/A 01/25/2019   Procedure: LEFT HEART CATH AND CORONARY ANGIOGRAPHY;  Surgeon: [name], Peter M, MD;  Location: [place] CV LAB;  Service: Cardiovascular;  Laterality: N/A;  . LIGATION OF ARTERIOVENOUS  FISTULA Right 06/25/2019   Procedure: Ligation Of Arteriovenous  Fistula right arm.;  Surgeon: Angelia Mould, MD;  Location: [place];  Service: Vascular;  Laterality: Right;  . WOUND DEBRIDEMENT N/A 09/24/2017   Procedure: DEBRIDEMENT WOUND;  Surgeon: [name], DPM;  Location: [place];  Service: Podiatry;   Laterality: N/A;   Family History  Problem Relation Age of Onset  . Heart disease Mother   . Diabetes Mother   . Heart disease Father   . Cancer Neg Hx    Social History:  reports that he has never smoked. He has never used smokeless tobacco. He reports that he does not drink alcohol or use drugs. Allergies  Allergen Reactions  . Oxycodone-Acetaminophen Other (See Comments)    Hallucinations/delirum   Prior to Admission medications   Medication Sig Start Date End Date Taking? Authorizing Provider  acetaminophen (TYLENOL) 325 MG tablet Take 325 mg by mouth every 6 (six) hours as needed for moderate pain or headache.    Yes [provider]  amiodarone (PACERONE) 200 MG tablet Take 1 tablet by mouth once daily Patient taking differently: Take 200 mg by mouth daily.  08/18/19  Yes Imogene Burn, PA-C  amoxicillin (AMOXIL) 500 MG capsule Take 500 mg by mouth 2 (two) times daily. 10/05/19  Yes [provider]  aspirin 81 MG chewable tablet Chew 1 tablet (81 mg total) by mouth daily. 01/26/19  Yes Helberg, Larkin Ina, MD  bictegravir-emtricitabine-tenofovir AF (BIKTARVY) 50-200-25 MG TABS tablet Take 1 tablet by mouth daily. 03/10/19  Yes Bowen Callas, NP  cinacalcet (SENSIPAR) 90 MG tablet Take 90 mg by mouth daily. 04/27/18  Yes [provider]  ferric citrate (AURYXIA) 1 GM 210 MG(Fe) tablet Take 420-840 mg by mouth See admin instructions. Take 840 mg by mouth three times a day after meals and 420 mg after each snack   Yes [provider]  gabapentin (NEURONTIN) 100 MG capsule Take 1 capsule (100 mg total) by mouth every dialysis. 01/09/18  Yes Jamse Arn, MD  insulin glargine (LANTUS) 100 UNIT/ML injection Inject 30 Units into the skin at bedtime. 01/16/18  Yes [provider]  linagliptin (TRADJENTA) 5 MG TABS tablet Take 5 mg by mouth daily. 01/26/19  Yes [provider]  metoprolol succinate (TOPROL-XL) 100 MG 24 hr tablet TAKE 1  TABLET BY MOUTH ONCE DAILY, TAKE WITH OR IMMEDIATELY FOLLOWING A MEAL Patient taking differently: Take 100 mg by mouth daily. TAKE 1 TABLET BY MOUTH ONCE DAILY TAKE WITH OR IMMEDIATELY FOLLOWING A MEAL 07/05/19  Yes Lyda Jester M, PA-C  nitroGLYCERIN (NITROSTAT) 0.4 MG SL tablet Place 1 tablet (0.4 mg total) under the tongue every 5 (five) minutes as needed for chest pain. 01/29/19  Yes Dorrell, Andree Elk, MD  ranolazine (RANEXA) 500 MG 12 hr tablet Take 1 tablet (500 mg total) by mouth 2 (two) times daily. Patient taking differently: Take 500 mg by mouth daily.  01/29/19  Yes Dorrell, Andree Elk, MD  rosuvastatin (CRESTOR) 20 MG tablet TAKE 1 TABLET BY MOUTH ONCE DAILY AT 6PM Patient taking differently: Take 20 mg by mouth daily.  08/23/19  Yes Helberg, Larkin Ina, MD  Semaglutide (RYBELSUS) 14 MG TABS Take 14 mg by mouth daily. 10/14/19  Yes Helberg, Larkin Ina, MD  traMADol (ULTRAM) 50 MG tablet Take 1 tablet (50 mg total) by mouth every 6 (six) hours as needed. 06/25/19  Yes Angelia Mould, MD  warfarin (COUMADIN) 7.5 MG tablet TAKE 1/2 (ONE-HALF) TABLET BY MOUTH ONCE DAILY EXCEPT 1 TABLET ON MONDAY,WEDNESDAY,AND SATURDAY OR AS DIRECTED BY [place] Patient taking differently: Take 3.75 mg by mouth daily at 6 PM.  07/14/19  Yes Turner, Eber Hong, MD  Accu-Chek FastClix Lancets MISC Check blood sugar 3 times a day 03/05/19   Seawell, Jaimie A, DO  ferric gluconate 125 mg in sodium chloride 0.9 % 100 mL Inject 125 mg into the vein Every Tuesday,Thursday,and Saturday with dialysis. Patient not taking: Reported on 10/16/2019 05/10/17   Hosie Poisson, MD  glucose blood (ACCU-CHEK GUIDE) test strip Use as instructed to check blood sugar 3 times a day 03/05/19   Seawell, Jaimie A, DO  insulin lispro (HUMALOG) 100 UNIT/ML injection Inject 10 Units into the skin 2 (two) times daily. 01/26/19   [provider]  Insulin Pen Needle 32G X 4 MM MISC Use to inject both mealtime and long acting  insulins up to 3 injections  a day 03/05/19   Seawell, Jaimie A, DO  omega-3 acid ethyl esters (LOVAZA) 1 g capsule Take 2 capsules (2 g total) by mouth 2 (two) times daily. Patient not taking: Reported on 10/16/2019 02/03/19   Sueanne Margarita, MD  pantoprazole (PROTONIX) 40 MG tablet Take 2 tablets (80 mg total) by mouth daily. Patient not taking: Reported on 10/16/2019 02/16/18   Alphonzo Grieve, MD  polycarbophil (FIBERCON) 625 MG tablet Take 1 tablet (625 mg total) by mouth daily. Patient not taking: Reported on 10/16/2019 12/19/17   Angiulli, Lavon Paganini, PA-C  Skin Protectants, Misc. (EUCERIN) cream Apply topically as needed for  dry skin. Patient not taking: Reported on 10/16/2019 02/08/19   Welford Roche, MD   Current Facility-Administered Medications  Medication Dose Route Frequency Provider Last Rate Last Dose  . acetaminophen (TYLENOL) tablet 650 mg  650 mg Oral Q4H PRN Masoudi, Elhamalsadat, MD   650 mg at 10/17/19 0901  . amiodarone (PACERONE) tablet 200 mg  200 mg Oral Daily Masoudi, Elhamalsadat, MD   200 mg at 10/17/19 0905  . amoxicillin (AMOXIL) capsule 500 mg  500 mg Oral BID Masoudi, Elhamalsadat, MD   500 mg at 10/17/19 0905  . aspirin chewable tablet 81 mg  81 mg Oral Daily Masoudi, Elhamalsadat, MD   81 mg at 10/17/19 0905  . bictegravir-emtricitabine-tenofovir AF (BIKTARVY) 50-200-25 MG per tablet 1 tablet  1 tablet Oral Daily Masoudi, Elhamalsadat, MD   1 tablet at 10/17/19 0915  . Chlorhexidine Gluconate Cloth 2 % PADS 6 each  6 each Topical Daily Axel Filler, MD   6 each at 10/17/19 0915  . [START ON 10/19/2019] gabapentin (NEURONTIN) capsule 100 mg  100 mg Oral Q T,Th,Sat-1800 Masoudi, Elhamalsadat, MD      . heparin ADULT infusion 100 units/mL (25000 units/262mL sodium chloride 0.45%)  2,050 Units/hr Intravenous Continuous Axel Filler, MD 20.5 mL/hr at 10/17/19 1210 2,050 Units/hr at 10/17/19 1210  . insulin aspart (novoLOG) injection 0-9 Units   0-9 Units Subcutaneous Q4H Masoudi, Elhamalsadat, MD   2 Units at 10/17/19 1212  . metoprolol succinate (TOPROL-XL) 24 hr tablet 100 mg  100 mg Oral Daily Masoudi, Elhamalsadat, MD   100 mg at 10/17/19 0905  . nitroGLYCERIN (NITROSTAT) SL tablet 0.4 mg  0.4 mg Sublingual Q5 Min x 3 PRN Masoudi, Elhamalsadat, MD      . ondansetron (ZOFRAN) injection 4 mg  4 mg Intravenous Q6H PRN Masoudi, Elhamalsadat, MD      . ranolazine (RANEXA) 12 hr tablet 500 mg  500 mg Oral Daily Masoudi, Elhamalsadat, MD   500 mg at 10/17/19 0906  . rosuvastatin (CRESTOR) tablet 20 mg  20 mg Oral Daily Masoudi, Elhamalsadat, MD   20 mg at 10/17/19 0906  . traMADol (ULTRAM) tablet 50 mg  50 mg Oral Q12H PRN Masoudi, Dorthula Rue, MD       Labs: Basic Metabolic Panel: Recent Labs  Lab 10/16/19 1505 10/17/19 0126  NA 138 136  K 4.1 3.7  CL 99 99  CO2 22 19*  GLUCOSE 193* 240*  BUN 54* 66*  CREATININE 10.79* 12.09*  CALCIUM 9.3 9.1   Liver Function Tests: No results for input(s): AST, ALT, ALKPHOS, BILITOT, PROT, ALBUMIN in the last 168 hours. No results for input(s): LIPASE, AMYLASE in the last 168 hours. No results for input(s): AMMONIA in the last 168 hours. CBC: Recent Labs  Lab 10/11/19 1155 10/16/19 1505 10/17/19 0126  WBC 6.4 8.0 5.4  NEUTROABS 4,531  --   --   HGB 13.5 14.5 13.4  HCT 39.2 41.8 38.3*  MCV 99.7 101.0* 101.1*  PLT 160 141* 140*   Cardiac Enzymes: No results for input(s): CKTOTAL, CKMB, CKMBINDEX, TROPONINI in the last 168 hours. CBG: Recent Labs  Lab 10/17/19 0618 10/17/19 0738 10/17/19 1131  GLUCAP 167* 159* 181*   Iron Studies: No results for input(s): IRON, TIBC, TRANSFERRIN, FERRITIN in the last 72 hours. Studies/Results: Dg Chest Portable 1 View  Result Date: 10/16/2019 CLINICAL DATA:  Acute chest pain EXAM: PORTABLE CHEST 1 VIEW COMPARISON:  06/25/2019 and prior chest radiographs FINDINGS: Cardiomegaly and mild basilar atelectasis noted.  A RIGHT IJ central venous  catheter is noted with tip overlying the LOWER SVC. There is no evidence of focal airspace disease, pulmonary edema, suspicious pulmonary nodule/mass, pleural effusion, or pneumothorax. No acute bony abnormalities are identified. IMPRESSION: 1. Cardiomegaly with mild bibasilar atelectasis. Electronically Signed   By: Margarette Canada M.D.   On: 10/16/2019 16:06    ROS: As per HPI otherwise negative.    Physical Exam: Vitals:   10/17/19 0104 10/17/19 0652 10/17/19 0739 10/17/19 1215  BP: 118/80 116/88 (!) 117/91 102/61  Pulse: 78 73 72 68  Resp: 16 16  18   Temp: 97.7 F (36.5 C) 98.4 F (36.9 C) 98.4 F (36.9 C) 98.3 F (36.8 C)  TempSrc: Oral Oral Oral Oral  SpO2: 97% 98% 97% 97%  Weight:  126.2 kg    Height:         General: Well developed, well nourished, in no acute distress. Head: Normocephalic, atraumatic, sclera non-icteric, mucus membranes are moist Neck: Supple. JVD not elevated. Lungs: Clear bilaterally to auscultation without wheezes, rales, or rhonchi. Breathing is unlabored. Heart: RRR with S1 S2. No murmurs, rubs, or gallops appreciated. Abdomen: Soft, non-tender, non-distended with normoactive bowel sounds. No rebound/guarding. No obvious abdominal masses. M-S:  Strength and tone appear normal for age. Lower extremities:Bilateral BKA-wearing prosthesis. No edema in thighs, lower abdomen, sacral area.  Neuro: Alert and oriented X 3. Moves all extremities spontaneously. Psych:  Responds to questions appropriately with a normal affect. Dialysis Access: L RC AVF + bruit RIJ TDC Drsg CDI.   Dialysis: [facility]  4.5h  400/800   117.5kg  2/2.25  RIJ TDC/ maturing RC AVF  Hep none -Hectorol 5 mcg IV TIW  Assessment/Plan: 1.  Chest Pain/USA-per primary. H/O known 2 vessel CAD.  Cardiology following 2.  H/O AFib/Afultter-currently on amiodarone, heparin gtt per primary.  3.  ESRD -  T,Th,S Next HD 10/17/19. Use one needle in AVF/one port of TDC next treatment. No Heparin.  4.   Hypertension/volume  -BP well controlled. No evidence of volume overload. Says he believes he has gained weight and this is the reason he is not reaching EDW. I agree. EDW does need to be raised. Nadir weight in past 5 treatments 119.3 kg. Could probably raise EDW to 119 kg. Continue metoprolol succ.  5.  Anemia  - HGB 13.4. Follow HGB 6.  Metabolic bone disease -  Struggles with control of Phos. Continue binders, VDRA, Sensipar 7.  Nutrition -Renal/Carb mod diet, renal vit, nepro 8. DM-per primary 9. H/O Ascending Aortic aneurysm-follows with Cards.  10. HIV-meds per primary  Rita H. Owens Shark, NP-C 10/17/2019, 12:46 PM  [facility] Beeper [phone]  Pt seen, examined and agree w A/P as above.  [name]  MD 10/17/2019, 3:04 PM

## 2019-10-17 NOTE — Progress Notes (Signed)
Clarification - pt does not have urgent need for HD today, will plan short HD tomorrow here, then back on usual schedule Tuesday.    [name], MD 10/17/2019, 6:09 PM

## 2019-10-17 NOTE — Progress Notes (Signed)
ANTICOAGULATION CONSULT NOTE - Follow Up Consult  Pharmacy Consult for heparin Indication: chest pain/ACS  Allergies  Allergen Reactions  . Oxycodone-Acetaminophen Other (See Comments)    Hallucinations/delirum    Patient Measurements: Height: 6\' 5"  (195.6 cm) Weight: 278 lb 3.2 oz (126.2 kg)(scale a) IBW/kg (Calculated) : 89.1 Heparin Dosing Weight: 117 kg  Vital Signs: Temp: 98.3 F (36.8 C) (10/18 1928) Temp Source: Oral (10/18 1928) BP: 122/86 (10/18 1928) Pulse Rate: 71 (10/18 1928)  Labs: Recent Labs    10/16/19 1505 10/16/19 1545 10/16/19 2255 10/17/19 0126 10/17/19 0541 10/17/19 1027 10/17/19 1344 10/17/19 1945  HGB 14.5  --   --  13.4  --   --   --   --   HCT 41.8  --   --  38.3*  --   --   --   --   PLT 141*  --   --  140*  --   --   --   --   LABPROT  --  21.0*  --   --   --   --   --   --   INR  --  1.8*  --   --   --   --   --   --   HEPARINUNFRC  --   --   --  0.20*  --  0.28*  --  0.37  CREATININE 10.79*  --   --  12.09*  --   --   --   --   TROPONINIHS 153*  --  522*  --  686*  --  580*  --     Estimated Creatinine Clearance: 10.4 mL/min (A) (by C-G formula based on SCr of 12.09 mg/dL (H)).   Medications:  Scheduled:  . amiodarone  200 mg Oral Daily  . amoxicillin  500 mg Oral BID  . aspirin  81 mg Oral Daily  . bictegravir-emtricitabine-tenofovir AF  1 tablet Oral Daily  . Chlorhexidine Gluconate Cloth  6 each Topical Daily  . [START ON 10/18/2019] Chlorhexidine Gluconate Cloth  6 each Topical Q0600  . [START ON 10/19/2019] gabapentin  100 mg Oral Q T,Th,Sat-1800  . insulin aspart  0-9 Units Subcutaneous Q4H  . metoprolol succinate  100 mg Oral Daily  . ranolazine  500 mg Oral Daily  . rosuvastatin  20 mg Oral Daily   Infusions:  . heparin 2,050 Units/hr (10/17/19 1210)    Assessment: 50 yom presented with unstable angina, hx of PAF on warfarin PTA (home dose: 3.75 mg daily). Started on heparin infusion d/t sub-therapeutic INR of 1.8  at admission and holding warfarin due to possible cath 10/19. Positive trops.   Heparin now therapeutic.  Goal of Therapy:  Heparin level 0.3-0.7 units/ml Monitor platelets by anticoagulation protocol: Yes   Plan:  -Continue heparin 2050 units/hr -Daily heparin level and CBC   Arrie Senate, PharmD, BCPS Clinical Pharmacist [phone] Please check AMION for all [place] numbers 10/17/2019

## 2019-10-17 NOTE — Progress Notes (Signed)
Progress Note  Patient Name: Thomas [name] Date of Encounter: 10/17/2019  Primary Cardiologist: Fransico Him, MD  Subjective   No chest pain overnight, feels better today.  He anticipates reattempting hemodialysis today.  Inpatient Medications    Scheduled Meds: . amiodarone  200 mg Oral Daily  . amoxicillin  500 mg Oral BID  . aspirin  81 mg Oral Daily  . bictegravir-emtricitabine-tenofovir AF  1 tablet Oral Daily  . Chlorhexidine Gluconate Cloth  6 each Topical Daily  . [START ON 10/19/2019] gabapentin  100 mg Oral Q T,Th,Sat-1800  . insulin aspart  0-9 Units Subcutaneous Q4H  . metoprolol succinate  100 mg Oral Daily  . ranolazine  500 mg Oral Daily  . rosuvastatin  20 mg Oral Daily   Continuous Infusions: . heparin 1,800 Units/hr (10/17/19 0920)   PRN Meds: acetaminophen, nitroGLYCERIN, ondansetron (ZOFRAN) IV, traMADol   Vital Signs    Vitals:   10/16/19 2024 10/17/19 0104 10/17/19 0652 10/17/19 0739  BP: 128/86 118/80 116/88 (!) 117/91  Pulse: 66 78 73 72  Resp: 16 16 16    Temp: 98 F (36.7 C) 97.7 F (36.5 C) 98.4 F (36.9 C) 98.4 F (36.9 C)  TempSrc: Oral Oral Oral Oral  SpO2: 100% 97% 98% 97%  Weight: 126.3 kg  126.2 kg   Height:        Intake/Output Summary (Last 24 hours) at 10/17/2019 1022 Last data filed at 10/17/2019 0500 Gross per 24 hour  Intake 436.22 ml  Output -  Net 436.22 ml   Filed Weights   10/16/19 1456 10/16/19 2024 10/17/19 0652  Weight: 131.5 kg 126.3 kg 126.2 kg    Telemetry    Sinus rhythm.  Personally reviewed.  ECG    An ECG dated 10/17/2019 was personally reviewed today and demonstrated:  Sinus rhythm with prolonged PR interval and nonspecific ST-T changes.  Physical Exam   GEN:  Obese male.  No acute distress.   Neck: No JVD. Cardiac: RRR, soft systolic murmur, rub, or gallop.  Respiratory: Nonlabored. Clear to auscultation bilaterally. GI: Soft, nontender, bowel sounds present. MS:  Status post bilateral  BKA with prostheses. Neuro:  Nonfocal. Psych: Alert and oriented x 3. Normal affect.  Labs    Chemistry Recent Labs  Lab 10/16/19 1505 10/17/19 0126  NA 138 136  K 4.1 3.7  CL 99 99  CO2 22 19*  GLUCOSE 193* 240*  BUN 54* 66*  CREATININE 10.79* 12.09*  CALCIUM 9.3 9.1  GFRNONAA 5* 4*  GFRAA 6* 5*  ANIONGAP 17* 18*     Hematology Recent Labs  Lab 10/11/19 1155 10/16/19 1505 10/17/19 0126  WBC 6.4 8.0 5.4  RBC 3.93* 4.14* 3.79*  HGB 13.5 14.5 13.4  HCT 39.2 41.8 38.3*  MCV 99.7 101.0* 101.1*  MCH 34.4* 35.0* 35.4*  MCHC 34.4 34.7 35.0  RDW 14.8 14.5 14.5  PLT 160 141* 140*    Cardiac Enzymes Recent Labs  Lab 10/16/19 1505 10/16/19 2255 10/17/19 0541  TROPONINIHS 153* 522* 686*    Radiology    Dg Chest Portable 1 View  Result Date: 10/16/2019 CLINICAL DATA:  Acute chest pain EXAM: PORTABLE CHEST 1 VIEW COMPARISON:  06/25/2019 and prior chest radiographs FINDINGS: Cardiomegaly and mild basilar atelectasis noted. A RIGHT IJ central venous catheter is noted with tip overlying the LOWER SVC. There is no evidence of focal airspace disease, pulmonary edema, suspicious pulmonary nodule/mass, pleural effusion, or pneumothorax. No acute bony abnormalities are identified. IMPRESSION: 1. Cardiomegaly  with mild bibasilar atelectasis. Electronically Signed   By: Margarette Canada M.D.   On: 10/16/2019 16:06    Cardiac Studies   Cardiac catheterization 01/25/2019:  Prox LAD lesion is 30% stenosed.  Ost 3rd Diag lesion is 95% stenosed.  Mid LAD lesion is 80% stenosed.  Dist LAD lesion is 99% stenosed.  Ost 1st Diag lesion is 100% stenosed.  Acute Mrg lesion is 95% stenosed.  1st RPLB lesion is 100% stenosed.  Post Atrio lesion is 95% stenosed.  LV end diastolic pressure is mildly elevated.   1. Severe 2 vessel obstructive CAD    - 80% mid LAD    - 99% distal LAD and third diagonal    - 100% first diagonal    - 100% first PLOM, 95% ongoing PL branch    - 90%  RV marginal branch. 2. Mildly elevated LVEDP 24 mm Hg.  Plan: Compared to June 2019 there is no significant change. The only lesion that is amenable to PCI is the mid LAD. This would be a complex PCI with need for atherectomy and stenting due to severe calcification.  It would also require DAPT as well as coumadin increasing bleeding risk. As noted in prior cath note I don't think treatment of this lesion would add to his overall prognosis. The patient has only had 2 episodes of angina since last June. One occurred last October when he became hypotensive during dialysis. His recent symptoms are clearly related to development of atrial flutter and  his symptoms resolved with restoration of NSR. This would also argue for continued medical management.   Echocardiogram 01/24/2019: - Left ventricle: The cavity size was mildly dilated. Wall   thickness was increased in a pattern of severe LVH. Systolic   function was moderately reduced. The estimated ejection fraction   was in the range of 35% to 40%. Diffuse hypokinesis. Doppler   parameters are consistent with restrictive physiology, indicative   of decreased left ventricular diastolic compliance and/or   increased left atrial pressure. Doppler parameters are consistent   with high ventricular filling pressure. - Aortic valve: There was mild regurgitation. - Aortic root: The aortic root was mildly dilated. - Ascending aorta: The ascending aorta was mildly dilated. - Mitral valve: Calcified annulus. There was mild regurgitation. - Left atrium: The atrium was severely dilated. - Pulmonary arteries: Systolic pressure was mildly increased. PA   peak pressure: 42 mm Hg (S).  Impressions:  - Moderate global reduction in LV systolic function (EF 40); severe   LVH; restrictive filling; mild LVE; mild AI; mildly dilated   aortic root/ascending aorta; mild MR; severe LAE; mild TR with   mild pulmonary hypertension.  Patient Profile     53 y.o.  male with a history of S&D CHF, atrial flutter on amiodarone and coumadin, CAD with last cath 12/2018 medical management, ESRD on HD, DM, HTN, HLD, Bilateral BKA, HIV, PAD  now presenting with unstable angina.  Assessment & Plan    1.  Unstable angina by history.  High-sensitivity troponin I trend has further increased to 686, although still somewhat of a gray zone in terms of ACS with underlying ESRD.  ECG shows no acute changes.  2.  Multivessel CAD as outlined above.  Cardiac catheterization from January resulted in recommendation for medical therapy.  Mid LAD was potentially a revascularization option however it would be a complex procedure requiring arthrectomy and stent intervention.  3.  ESRD on hemodialysis.  4.  Paroxysmal atrial flutter, maintaining  sinus rhythm and recently on Coumadin although subtherapeutic and at present on heparin.  5.  Hyperlipidemia, on statin therapy.  6.  Ascending aortic aneurysm of 43 mm as of January.  7.  History of dilated cardiomyopathy, LVEF 35 to 40%.  Patient anticipates a hemodialysis session today, would see how he does clinically in terms of angina symptoms.  Continue Amiodarone, aspirin, Toprol-XL, Crestor, and Ranexa.  Heparin infusion continues with Coumadin presently on hold in case cardiac catheterization is pursued.  Current heart rate and blood pressure are adequately controlled.  If angina recurs, would consider follow-up cardiac catheterization tomorrow realizing that revascularization options may be limited as before.  Signed, Rozann Lesches, MD  10/17/2019, 10:22 AM

## 2019-10-18 DIAGNOSIS — I96 Gangrene, not elsewhere classified: Secondary | ICD-10-CM

## 2019-10-18 DIAGNOSIS — N186 End stage renal disease: Secondary | ICD-10-CM

## 2019-10-18 DIAGNOSIS — I480 Paroxysmal atrial fibrillation: Secondary | ICD-10-CM

## 2019-10-18 DIAGNOSIS — E118 Type 2 diabetes mellitus with unspecified complications: Secondary | ICD-10-CM

## 2019-10-18 DIAGNOSIS — Z992 Dependence on renal dialysis: Secondary | ICD-10-CM

## 2019-10-18 DIAGNOSIS — Z885 Allergy status to narcotic agent status: Secondary | ICD-10-CM

## 2019-10-18 DIAGNOSIS — E1152 Type 2 diabetes mellitus with diabetic peripheral angiopathy with gangrene: Secondary | ICD-10-CM

## 2019-10-18 LAB — GLUCOSE, CAPILLARY
Glucose-Capillary: 196 mg/dL — ABNORMAL HIGH (ref 70–99)
Glucose-Capillary: 198 mg/dL — ABNORMAL HIGH (ref 70–99)
Glucose-Capillary: 287 mg/dL — ABNORMAL HIGH (ref 70–99)
Glucose-Capillary: 296 mg/dL — ABNORMAL HIGH (ref 70–99)

## 2019-10-18 LAB — HEMOGLOBIN A1C
Hgb A1c MFr Bld: 9.4 % — ABNORMAL HIGH (ref 4.8–5.6)
Mean Plasma Glucose: 223 mg/dL

## 2019-10-18 LAB — BASIC METABOLIC PANEL
Anion gap: 19 — ABNORMAL HIGH (ref 5–15)
BUN: 83 mg/dL — ABNORMAL HIGH (ref 6–20)
CO2: 20 mmol/L — ABNORMAL LOW (ref 22–32)
Calcium: 9.1 mg/dL (ref 8.9–10.3)
Chloride: 98 mmol/L (ref 98–111)
Creatinine, Ser: 14.19 mg/dL — ABNORMAL HIGH (ref 0.61–1.24)
GFR calc Af Amer: 4 mL/min — ABNORMAL LOW (ref >60)
GFR calc non Af Amer: 3 mL/min — ABNORMAL LOW (ref >60)
Glucose, Bld: 226 mg/dL — ABNORMAL HIGH (ref 70–99)
Potassium: 4 mmol/L (ref 3.5–5.1)
Sodium: 137 mmol/L (ref 135–145)

## 2019-10-18 LAB — PHOSPHORUS: Phosphorus: 4.3 mg/dL (ref 2.5–4.6)

## 2019-10-18 LAB — CBC
HCT: 37.2 % — ABNORMAL LOW (ref 39.0–52.0)
Hemoglobin: 12.5 g/dL — ABNORMAL LOW (ref 13.0–17.0)
MCH: 33.8 pg (ref 26.0–34.0)
MCHC: 33.6 g/dL (ref 30.0–36.0)
MCV: 100.5 fL — ABNORMAL HIGH (ref 80.0–100.0)
Platelets: 141 10*3/uL — ABNORMAL LOW (ref 150–400)
RBC: 3.7 MIL/uL — ABNORMAL LOW (ref 4.22–5.81)
RDW: 14.3 % (ref 11.5–15.5)
WBC: 5.2 10*3/uL (ref 4.0–10.5)
nRBC: 0 % (ref 0.0–0.2)

## 2019-10-18 LAB — HEPARIN LEVEL (UNFRACTIONATED)
Heparin Unfractionated: 0.26 IU/mL — ABNORMAL LOW (ref 0.30–0.70)
Heparin Unfractionated: 0.6 IU/mL (ref 0.30–0.70)

## 2019-10-18 LAB — PROTIME-INR
INR: 1.8 — ABNORMAL HIGH (ref 0.8–1.2)
Prothrombin Time: 20.7 seconds — ABNORMAL HIGH (ref 11.4–15.2)

## 2019-10-18 MED ORDER — DIPHENHYDRAMINE HCL 25 MG PO CAPS
50.0000 mg | ORAL_CAPSULE | Freq: Once | ORAL | Status: AC
  Administered 2019-10-18: 50 mg via ORAL

## 2019-10-18 MED ORDER — WARFARIN - PHARMACIST DOSING INPATIENT
Freq: Every day | Status: DC
  Administered 2019-10-18: 17:00:00

## 2019-10-18 MED ORDER — ACETAMINOPHEN 325 MG PO TABS
ORAL_TABLET | ORAL | Status: AC
  Filled 2019-10-18: qty 2

## 2019-10-18 MED ORDER — WARFARIN SODIUM 5 MG PO TABS: 5.0000 mg | ORAL_TABLET | Freq: Once | ORAL | Status: DC

## 2019-10-18 MED ORDER — INSULIN GLARGINE 100 UNIT/ML ~~LOC~~ SOLN
7.0000 [IU] | Freq: Every day | SUBCUTANEOUS | Status: DC
  Administered 2019-10-18: 7 [IU] via SUBCUTANEOUS
  Filled 2019-10-18: qty 0.07

## 2019-10-18 MED ORDER — WARFARIN SODIUM 5 MG PO TABS
5.0000 mg | ORAL_TABLET | Freq: Once | ORAL | Status: AC
  Administered 2019-10-18: 5 mg via ORAL
  Filled 2019-10-18: qty 1

## 2019-10-18 MED ORDER — CHLORHEXIDINE GLUCONATE CLOTH 2 % EX PADS: 6.0000 | MEDICATED_PAD | Freq: Every day | CUTANEOUS | Status: DC

## 2019-10-18 MED ORDER — HEPARIN SODIUM (PORCINE) 1000 UNIT/ML IJ SOLN
INTRAMUSCULAR | Status: AC
  Filled 2019-10-18: qty 1

## 2019-10-18 MED ORDER — INSULIN GLARGINE 100 UNIT/ML ~~LOC~~ SOLN: 5.0000 [IU] | Freq: Every day | SUBCUTANEOUS | Status: DC

## 2019-10-18 MED ORDER — DIPHENHYDRAMINE HCL 25 MG PO CAPS
ORAL_CAPSULE | ORAL | Status: AC
  Filled 2019-10-18: qty 2

## 2019-10-18 NOTE — Progress Notes (Signed)
   Subjective: Pt seen at dialysis this morning. Denies any further chest pain or discomfort. Wishes to go home today for his anniversary tomorrow.   Objective:  Vital signs in last 24 hours: Vitals:   10/17/19 1928 10/17/19 2348 10/18/19 0517 10/18/19 0525  BP: 122/86 (!) 139/96  (!) 136/95  Pulse: 71 70  66  Resp: 18 18  17   Temp: 98.3 F (36.8 C) 98 F (36.7 C)  98.4 F (36.9 C)  TempSrc: Oral Oral  Oral  SpO2: 96% 97%  99%  Weight:   126 kg   Height:       Physical Exam Vitals signs and nursing note reviewed.  Constitutional:      General: He is not in acute distress. Cardiovascular:     Heart sounds: Normal heart sounds. No murmur. No friction rub. No gallop.   Pulmonary:     Effort: Pulmonary effort is normal. No respiratory distress.     Breath sounds: Normal breath sounds.  Abdominal:     General: Bowel sounds are normal.     Palpations: Abdomen is soft.  Musculoskeletal:     Comments: Bilateral lower extremity amputations.   Skin:    General: Skin is warm and dry.     Comments: Black necrosis on tip of L middle finger.  Neurological:     Mental Status: He is alert.    Assessment/Plan:  Principal Problem:   Unstable angina (HCC) Active Problems:   ESRD on dialysis ([place])   Type 2 diabetes mellitus with complication (HCC)   PAF (paroxysmal atrial fibrillation) (HCC)   CAD (coronary artery disease), native coronary artery   Thomas [name] is a 53 year old male who presented for chest pain during a hemodialysis session on Saturday that was alleviated with two nitroglycerin doses given by EMS. Troponin peaked at 686 yesterday, and currently medically managing. Patient seen at dialysis today and without chest pain.   Unstable Angina:  Cardiology following, recommending continued medical therapy due to overall poor prognosis. Pt to use nitroglycerin for recurring chest pain.   - Troponins: 580 < 686 < 522< 153 - continueASA 81 mg QD - continue cardiac  monitoring - continue IV Heparin - continue  Crestor 20 mg QD - continue metoprolol 100 mg QD - EKG: sinus rhythm with prolonged PR interval and ST changes in the anterolateral leads   Paroxysmal A. Fib: Patient with a PMHx of a fib. Currently rate controlled. Continue home medications and anticoagulate with IV heparin.  - hold home anticoagulation while on heparin - continue home amiodarone 200mg  daily - continue home metoprolol 100mg  daily   Distal L Finger Lesion:  - received amoxicillin 500mg  twice yesterday - nothing to do inpatient, will need outpatient follow-up with hand surgery   ESRD: Patient's HD schedule is Tuesday, Thursday, Saturday. He only completed one hour of his Saturday session before being admitted to [facility].  - Nephrology consulted, appreciate their assistance - plan for short dialysis session today and then back on regular schedule tomorrow    DM: On insulin at home, 30 units lantus and 10 units lispro BID - CBG monitoring - continue SSI - start Lantus 7 units daily   Diet - heart healthy Fluids - none DVT ppx - on heparin  CODE STATUS - FULL CODE   Dispo: Anticipated discharge in approximately 0-1 day.   Ladona Horns, MD 10/18/2019, 6:17 AM Pager: [phone]

## 2019-10-18 NOTE — TOC Progression Note (Signed)
Transition of Care [facility]) - Progression Note    Patient Details  Name: Katharine Yannuzzi MRN: [id number] Date of Birth: 09-22-1966  Transition of Care [facility] CM/SW Contact  Zenon Mayo, RN Phone Number: 10/18/2019, 6:51 PM  Clinical Narrative:    Patient dc home, per Staff RN he had no needs,he had transportation.        Expected Discharge Plan and Services           Expected Discharge Date: 10/18/19                                     Social Determinants of Health (SDOH) Interventions    Readmission Risk Interventions Readmission Risk Prevention Plan 10/18/2019  Transportation Screening Complete  Palliative Care Screening Not Applicable  Medication Review (RN Care Manager) Complete  Some recent data might be hidden

## 2019-10-18 NOTE — Telephone Encounter (Signed)
[facility] Patient Advocate Encounter  Received an updated fax from [place] giving the patient enrollment period of 10/04/2019 to 12/30/2019. Subject to change with insurance coverage, income change or adap status. Billing information is the same as below.

## 2019-10-18 NOTE — Progress Notes (Signed)
Internal Medicine Attending:   I saw and examined the patient. I reviewed the resident's note and I agree with the resident's findings and plan as documented in the resident's note.  Patient feels well today and denies any chest pain or shortness of breath.  He wants to go home for his anniversary tomorrow.  Patient initially made to the hospital with chest pain concerning for unstable angina.  The chest pain was relieved with nitroglycerin.  His troponins peaked at 686 yesterday and are now downtrending.  Cardiology follow-up and recommendations appreciated.  We will continue with medical management for now including aspirin aspirin 81 mg, high intensity statin, beta-blocker.  Will DC IV heparin and transition patient to oral Coumadin.  No role for cardiac catheterization at this time.  No further work-up for now.  Will DC patient home today and have him follow-up in [facility] this week for hospital follow-up as well as INR check.

## 2019-10-18 NOTE — Progress Notes (Signed)
Pharmacy Note  Heparin level this am 0.26, below goal.  RN states that pt pulled his IV out >> RN had difficulty managing bleeding from pulled IV >> heparin was off ~1hr around the time of lab draw.  Bleeding now controlled and heparin gtt resumed; will continue heparin gtt at current rate and recheck level.  Wynona Neat, PharmD, BCPS 10/18/2019 5:43 AM

## 2019-10-18 NOTE — Discharge Summary (Signed)
Name: Thomas [name] MRN: [id number] DOB: 09-20-1966 53 y.o. PCP: [name], MD  Date of Admission: 10/16/2019  2:45 PM Date of Discharge: 10/18/2019 Attending Physician: Aldine Contes, MD  Discharge Diagnosis: 1. Unstable Angina   Discharge Medications: Allergies as of 10/18/2019      Reactions   Oxycodone-acetaminophen Other (See Comments)   Hallucinations/delirum      Medication List    STOP taking these medications   amoxicillin 500 MG capsule Commonly known as: AMOXIL     TAKE these medications   Accu-Chek FastClix Lancets Misc Check blood sugar 3 times a day   acetaminophen 325 MG tablet Commonly known as: TYLENOL Take 325 mg by mouth every 6 (six) hours as needed for moderate pain or headache.   amiodarone 200 MG tablet Commonly known as: PACERONE Take 1 tablet by mouth once daily   aspirin 81 MG chewable tablet Chew 1 tablet (81 mg total) by mouth daily.   Auryxia 1 GM 210 MG(Fe) tablet Generic drug: ferric citrate Take 420-840 mg by mouth See admin instructions. Take 840 mg by mouth three times a day after meals and 420 mg after each snack   bictegravir-emtricitabine-tenofovir AF 50-200-25 MG Tabs tablet Commonly known as: Biktarvy Take 1 tablet by mouth daily.   cinacalcet 90 MG tablet Commonly known as: SENSIPAR Take 90 mg by mouth daily.   eucerin cream Apply topically as needed for dry skin.   ferric gluconate 125 mg in sodium chloride 0.9 % 100 mL Inject 125 mg into the vein Every Tuesday,Thursday,and Saturday with dialysis.   gabapentin 100 MG capsule Commonly known as: Neurontin Take 1 capsule (100 mg total) by mouth every dialysis.   glucose blood test strip Commonly known as: Accu-Chek Guide Use as instructed to check blood sugar 3 times a day   HumaLOG 100 UNIT/ML injection Generic drug: insulin lispro Inject 10 Units into the skin 2 (two) times daily.   Insulin Pen Needle 32G X 4 MM Misc Use to inject both mealtime  and long acting insulins up to 3 injections  a day   Lantus 100 UNIT/ML injection Generic drug: insulin glargine Inject 30 Units into the skin at bedtime.   metoprolol succinate 100 MG 24 hr tablet Commonly known as: TOPROL-XL TAKE 1 TABLET BY MOUTH ONCE DAILY, TAKE WITH OR IMMEDIATELY FOLLOWING A MEAL What changed: See the new instructions.   nitroGLYCERIN 0.4 MG SL tablet Commonly known as: NITROSTAT Place 1 tablet (0.4 mg total) under the tongue every 5 (five) minutes as needed for chest pain.   omega-3 acid ethyl esters 1 g capsule Commonly known as: Lovaza Take 2 capsules (2 g total) by mouth 2 (two) times daily.   pantoprazole 40 MG tablet Commonly known as: PROTONIX Take 2 tablets (80 mg total) by mouth daily.   polycarbophil 625 MG tablet Commonly known as: FIBERCON Take 1 tablet (625 mg total) by mouth daily.   ranolazine 500 MG 12 hr tablet Commonly known as: RANEXA Take 1 tablet (500 mg total) by mouth 2 (two) times daily. What changed: when to take this   rosuvastatin 20 MG tablet Commonly known as: CRESTOR TAKE 1 TABLET BY MOUTH ONCE DAILY AT 6PM What changed: See the new instructions.   Rybelsus 14 MG Tabs Generic drug: Semaglutide Take 14 mg by mouth daily.   Tradjenta 5 MG Tabs tablet Generic drug: linagliptin Take 5 mg by mouth daily.   traMADol 50 MG tablet Commonly known as: Ultram Take 1 tablet (  50 mg total) by mouth every 6 (six) hours as needed.   warfarin 7.5 MG tablet Commonly known as: COUMADIN Take as directed. If you are unsure how to take this medication, talk to your nurse or doctor. Original instructions: TAKE 1/2 (ONE-HALF) TABLET BY MOUTH ONCE DAILY EXCEPT 1 TABLET ON MONDAY,WEDNESDAY,AND SATURDAY OR AS DIRECTED BY ANTICOAGULATIN CLINIC What changed:   how much to take  how to take this  when to take this  additional instructions       Disposition and follow-up:   Thomas [name] was discharged from [facility] in Stable condition.  At the hospital follow up visit please address:  1.  Pt presented for chest pain during a dialysis session, consistent with unstable angina. Troponin peaked at 686. Cardiology evaluated and recommended continued medical management with ASA, high intensity statin, and beta blocker. Completed dialysis session in the hospital without any further episodes of chest pain.  On IV heparin during hospitalization, which was stopped for transition to oral warfarin before discharge. Pt will need follow-up in Coumadin clinic and recheck of INR.   2.  Labs / imaging needed at time of follow-up: Recheck INR  3.  Pending labs/ test needing follow-up: NONE  Follow-up Appointments: Follow-up Information    [name], MD. Call in 1 day(s).   Specialty: Internal Medicine Why: Schedule hospital follow-up with your primary provider. Contact information: [place] [address] [phone]        Sueanne Margarita, MD .   Specialty: Cardiology Contact information: [phone] [address] [place] Hospital Course by problem list: 1. Unstable Angina Thomas [name] is a 53 year old M with ESRD on HD who presented for chest pain during his dialysis session on Saturday 10/17. He described it as chest pressure that resolved over time and with sublingual nitroglycerin. EKG was remarkable for nonspecific ST segment changes. Last cardiac catheterization in Jan 2020 showed severe 2 vessel obstructive CAD. Only one lesion in the mid LAD is amenable to PCI, which would be complex due to severe calcification requiring atherectomy, stenting, and dual antiplatelet therapy. Troponin peaked at 686 on 10/18. Cardiology evaluated the patient and recommended continued medical management with aspirin, IV heparin, metoprolol, and rosuvastatin. Repeat cardiac catheterization was not pursued due to resolution of pt's chest pain and prior cath  showing limited revascularization options. IV heparin was stopped in the afternoon of 10/19 and pt was given a 5mg  dose of oral warfarin prior to discharge. He was instructed to follow-up in clinic for hospital follow-up and INR recheck this week.   Discharge Vitals:   BP 133/79 (BP Location: Right Arm)   Pulse 75   Temp 98.4 F (36.9 C) (Oral)   Resp 17   Ht 6\' 5"  (1.956 m)   Wt 123.5 kg   SpO2 100%   BMI 32.29 kg/m   Pertinent Labs, Studies, and Procedures:  CBC Latest Ref Rng & Units 10/18/2019 10/17/2019 10/16/2019  WBC 4.0 - 10.5 K/uL 5.2 5.4 8.0  Hemoglobin 13.0 - 17.0 g/dL 12.5(L) 13.4 14.5  Hematocrit 39.0 - 52.0 % 37.2(L) 38.3(L) 41.8  Platelets 150 - 400 K/uL 141(L) 140(L) 141(L)   BMP Latest Ref Rng & Units 10/18/2019 10/17/2019 10/16/2019  Glucose 70 - 99 mg/dL 226(H) 240(H) 193(H)  BUN 6 - 20 mg/dL 83(H) 66(H) 54(H)  Creatinine 0.61 - 1.24 mg/dL 14.19(H) 12.09(H)  10.79(H)  BUN/Creat Ratio 9 - 20 - - -  Sodium 135 - 145 mmol/L 137 136 138  Potassium 3.5 - 5.1 mmol/L 4.0 3.7 4.1  Chloride 98 - 111 mmol/L 98 99 99  CO2 22 - 32 mmol/L 20(L) 19(L) 22  Calcium 8.9 - 10.3 mg/dL 9.1 9.1 9.3    EKG Interpretation  Date/Time:  Saturday October 16 2019 14:55:48 EDT Ventricular Rate:  78 PR Interval:    QRS Duration: 120 QT Interval:  430 QTC Calculation: 490 R Axis:   -26 Text Interpretation:  Sinus rhythm Prolonged PR interval Nonspecific intraventricular conduction delay Nonspecific T abnormalities, lateral leads ST elev, probable normal early repol pattern ST changes appear new Confirmed by Malvin Johns [phone]) on 10/16/2019 3:37:05 PM      Troponin from 10/17 to 10/18 580 << 686 << 522 << 153  Chest X-ray 10/16/2019 CLINICAL DATA:  Acute chest pain  EXAM: PORTABLE CHEST 1 VIEW  COMPARISON:  06/25/2019 and prior chest radiographs  FINDINGS: Cardiomegaly and mild basilar atelectasis noted.  A RIGHT IJ central venous catheter is noted with tip  overlying the LOWER SVC.  There is no evidence of focal airspace disease, pulmonary edema, suspicious pulmonary nodule/mass, pleural effusion, or pneumothorax.  No acute bony abnormalities are identified.  IMPRESSION: 1. Cardiomegaly with mild bibasilar atelectasis.   Discharge Instructions: Discharge Instructions    Call MD for:  difficulty breathing, headache or visual disturbances   Complete by: As directed    Call MD for:  extreme fatigue   Complete by: As directed    Call MD for:  persistant dizziness or light-headedness   Complete by: As directed    Call MD for:  persistant nausea and vomiting   Complete by: As directed    Call MD for:  redness, tenderness, or signs of infection (pain, swelling, redness, odor or green/yellow discharge around incision site)   Complete by: As directed    Call MD for:  severe uncontrolled pain   Complete by: As directed    Call MD for:  temperature >100.4   Complete by: As directed    Diet - low sodium heart healthy   Complete by: As directed    Discharge instructions   Complete by: As directed    Mr. Faler,  Your were seen in the hospital for chest pain during dialysis on Saturday. Your work-up showed some increased demand on the heart. Cardiology evaluated you and thought this should be managed with medications during your hospitalization and at home. Please continue taking your home blood pressure medicines, warfarin, and nitroglycerin tablets as needed for any additional chest pain.  Please go to your dialysis session tomorrow. Additionally, call to follow-up with your warfarin clinic tomorrow. It would be best if you could schedule an appointment with them on Wednesday to check your INR. You can resume your usual warfarin schedule tomorrow.   You should also have a hospital follow-up with your PCP and cardiologist in their respective clinics over the following week.  Please call your primary provider if you begin experiencing  chest pain, lightheadedness, nausea, or vomiting.   Increase activity slowly   Complete by: As directed       Signed: Ladona Horns, MD 10/18/2019, 5:56 PM   Pager: [phone]

## 2019-10-18 NOTE — Progress Notes (Addendum)
[facility] Progress Note   Subjective:   Patient seen and examined at bedside during dialysis.  No chest pain at this time.  Tolerating HD well. Denies SOB, n/v/d, dizziness, weakness and cramping. Hopeful he will get to go home today b/c tomorrow is his Anniversary.   Objective Vitals:   10/17/19 2348 10/18/19 0517 10/18/19 0525 10/18/19 0800  BP: (!) 139/96  (!) 136/95 (P) 112/76  Pulse: 70  66   Resp: 18  17   Temp: 98 F (36.7 C)  98.4 F (36.9 C)   TempSrc: Oral  Oral   SpO2: 97%  99%   Weight:  126 kg    Height:       Physical Exam General:NAD, WNWD well appearing male, sitting up in bed Heart:RRR Lungs:mostly CTAB, scattered wheezing, cleared with cough Abdomen:soft, NT Extremities:b/l BKA wearing prosthesis, no stump edema Dialysis Access: TDC in use, L RC AVF +b    Filed Weights   10/16/19 2024 10/17/19 0652 10/18/19 0517  Weight: 126.3 kg 126.2 kg 126 kg    Intake/Output Summary (Last 24 hours) at 10/18/2019 0854 Last data filed at 10/17/2019 2100 Gross per 24 hour  Intake 1389.58 ml  Output -  Net 1389.58 ml    Additional Objective Labs: Basic Metabolic Panel: Recent Labs  Lab 10/16/19 1505 10/17/19 0126 10/18/19 0503  NA 138 136 137  K 4.1 3.7 4.0  CL 99 99 98  CO2 22 19* 20*  GLUCOSE 193* 240* 226*  BUN 54* 66* 83*  CREATININE 10.79* 12.09* 14.19*  CALCIUM 9.3 9.1 9.1   CBC: Recent Labs  Lab 10/11/19 1155 10/16/19 1505 10/17/19 0126 10/18/19 0336  WBC 6.4 8.0 5.4 5.2  NEUTROABS 4,531  --   --   --   HGB 13.5 14.5 13.4 12.5*  HCT 39.2 41.8 38.3* 37.2*  MCV 99.7 101.0* 101.1* 100.5*  PLT 160 141* 140* 141*   CBG: Recent Labs  Lab 10/17/19 1131 10/17/19 1619 10/17/19 2022 10/18/19 0000 10/18/19 0440  GLUCAP 181* 221* 218* 287* 198*   Studies/Results: Dg Chest Portable 1 View  Result Date: 10/16/2019 CLINICAL DATA:  Acute chest pain EXAM: PORTABLE CHEST 1 VIEW COMPARISON:  06/25/2019 and prior chest  radiographs FINDINGS: Cardiomegaly and mild basilar atelectasis noted. A RIGHT IJ central venous catheter is noted with tip overlying the LOWER SVC. There is no evidence of focal airspace disease, pulmonary edema, suspicious pulmonary nodule/mass, pleural effusion, or pneumothorax. No acute bony abnormalities are identified. IMPRESSION: 1. Cardiomegaly with mild bibasilar atelectasis. Electronically Signed   By: Margarette Canada M.D.   On: 10/16/2019 16:06    Medications: . heparin 2,050 Units/hr (10/17/19 1210)   . amiodarone  200 mg Oral Daily  . aspirin  81 mg Oral Daily  . bictegravir-emtricitabine-tenofovir AF  1 tablet Oral Daily  . Chlorhexidine Gluconate Cloth  6 each Topical Daily  . Chlorhexidine Gluconate Cloth  6 each Topical Q0600  . [START ON 10/19/2019] gabapentin  100 mg Oral Q T,Th,Sat-1800  . insulin aspart  0-9 Units Subcutaneous Q4H  . insulin glargine  7 Units Subcutaneous Daily  . metoprolol succinate  100 mg Oral Daily  . ranolazine  500 mg Oral Daily  . rosuvastatin  20 mg Oral Daily    Dialysis Orders: [facility] TTS  4.5h  400/800   117.5kg  2/2.25  RIJ TDC/ maturing RC AVF  Hep none -Hectorol 5 mcg IV TIW  Assessment/Plan: 1. Chest pain/USA - per primary.  Hx  known 2 vessel CAD. Cardiology following. 2. ESRD - on HD TTS.  HD today off schedule.  K 4.0, resume regular schedule tomorrow. Start to using AVF 1:1 w/TDC.  No Heparin. 3. Anemia of CKD- Hgb 12.5. No indication for ESA. Follow.  4. Secondary hyperparathyroidism - Ca at goal. Checking phos. Continue binders, VDRA and sensipar.  5. HTN/volume - BP well controlled w/metoprolol succ. Does not appear volume overloaded.  Likely gained weight in the last few weeks, not meeting EDW at [place]. Nadir weight last 5 treatments 119.3kg.  Could probably raise EDW to 119kg.  6. Nutrition - Renal diet w/fluid restrictions.  7. DM - per primary 8. Hx ascending aortic aneurysm - per cards 9. HIV meds per primary 10. Hx A fib/flutter  - on amiodarone, heparin gtts per primary  Jen Mow, PA-C [facility] Pager: [phone] 10/18/2019,8:54 AM  LOS: 2 days   Pt seen, examined and agree w A/P as above.  [name]  MD 10/18/2019, 3:49 PM

## 2019-10-18 NOTE — Progress Notes (Signed)
ANTICOAGULATION CONSULT NOTE - Follow Up Consult  Pharmacy Consult for heparin, add warfarin Indication: chest pain/ACS  Allergies  Allergen Reactions  . Oxycodone-Acetaminophen Other (See Comments)    Hallucinations/delirum    Patient Measurements: Height: 6\' 5"  (195.6 cm) Weight: 272 lb 4.3 oz (123.5 kg) IBW/kg (Calculated) : 89.1 Heparin Dosing Weight: 117 kg  Vital Signs: Temp: 98.1 F (36.7 C) (10/19 1114) Temp Source: Oral (10/19 1114) BP: 105/82 (10/19 1104) Pulse Rate: 70 (10/19 1114)  Labs: Recent Labs    10/16/19 1505 10/16/19 1545 10/16/19 2255 10/17/19 0126 10/17/19 0541  10/17/19 1344 10/17/19 1945 10/18/19 0336 10/18/19 0503 10/18/19 1101  HGB 14.5  --   --  13.4  --   --   --   --  12.5*  --   --   HCT 41.8  --   --  38.3*  --   --   --   --  37.2*  --   --   PLT 141*  --   --  140*  --   --   --   --  141*  --   --   LABPROT  --  21.0*  --   --   --   --   --   --   --   --  20.7*  INR  --  1.8*  --   --   --   --   --   --   --   --  1.8*  HEPARINUNFRC  --   --   --  0.20*  --    < >  --  0.37 0.26*  --  0.60  CREATININE 10.79*  --   --  12.09*  --   --   --   --   --  14.19*  --   TROPONINIHS 153*  --  522*  --  686*  --  580*  --   --   --   --    < > = values in this interval not displayed.    Estimated Creatinine Clearance: 8.8 mL/min (A) (by C-G formula based on SCr of 14.19 mg/dL (H)).    Assessment: 31 yom presented with unstable angina, hx of PAF on warfarin PTA (home dose: 3.75 mg daily). Started on heparin infusion d/t sub-therapeutic INR of 1.8 at admission and holding warfarin due to possible cath 10/19. Positive trops.   Heparin now therapeutic.  INR still up at 1.8 despite several days without Coumadin.  Asked to resume tonight since no imminent plans for cath.  Goal of Therapy:  Heparin level 0.3-0.7 units/ml Monitor platelets by anticoagulation protocol: Yes   Plan:  -Continue heparin 2050 units/hr -Daily heparin level  and CBC -Resume warfarin tonight with 5 mg x 1 -Daily INR.  Marguerite Olea, [facility] Clinical Pharmacist Phone [phone]  10/18/2019 2:00 PM

## 2019-10-18 NOTE — TOC Initial Note (Signed)
Transition of Care [facility]) - Initial/Assessment Note    Patient Details  Name: Thomas [name] MRN: [id number] Date of Birth: Aug 22, 1966  Transition of [facility]) CM/SW Contact:    Zenon Mayo, RN Phone Number: 10/18/2019, 7:18 PM  Clinical Narrative:                 Patient is dc today,NCM contacted patient by phone, he states he has no problem getting medications or any other issues.    Expected Discharge Plan: Home/Self Care Barriers to Discharge: No Barriers Identified   Patient Goals and CMS Choice Patient states their goals for this hospitalization and ongoing recovery are:: relax and watch the football games   Choice offered to / list presented to : NA  Expected Discharge Plan and Services Expected Discharge Plan: Home/Self Care   Discharge Planning Services: CM Consult Post Acute Care Choice: NA Living arrangements for the past 2 months: Single Family Home Expected Discharge Date: 10/18/19               DME Arranged: (NA)         HH Arranged: NA          Prior Living Arrangements/Services Living arrangements for the past 2 months: Single Family Home Lives with:: Spouse Patient language and need for interpreter reviewed:: Yes Do you feel safe going back to the place where you live?: Yes      Need for Family Participation in Patient Care: No (Comment) Care giver support system in place?: Yes (comment)   Criminal Activity/Legal Involvement Pertinent to Current Situation/Hospitalization: No - Comment as needed  Activities of Daily Living Home Assistive Devices/Equipment: Cane (specify quad or straight), CBG Meter(quad) ADL Screening (condition at time of admission) Patient's cognitive ability adequate to safely complete daily activities?: Yes Is the patient deaf or have difficulty hearing?: No Does the patient have difficulty seeing, even when wearing glasses/contacts?: No Does the patient have difficulty concentrating, remembering, or making  decisions?: No Patient able to express need for assistance with ADLs?: Yes Does the patient have difficulty dressing or bathing?: No Independently performs ADLs?: Yes (appropriate for developmental age) Does the patient have difficulty walking or climbing stairs?: No Weakness of Legs: None Weakness of Arms/Hands: None  Permission Sought/Granted                  Emotional Assessment   Attitude/Demeanor/Rapport: Gracious Affect (typically observed): Appropriate Orientation: : Oriented to Self, Oriented to Place, Oriented to  Time, Oriented to Situation Alcohol / Substance Use: Not Applicable Psych Involvement: No (comment)  Admission diagnosis:  Unstable angina pectoris ([place]) [I20.0] Patient Active Problem List   Diagnosis Date Noted  . Obstructive sleep apnea 03/12/2019  . Polypharmacy 03/12/2019  . Transaminitis 03/05/2019  . QT prolongation 02/08/2019  . Lesion of finger 02/08/2019  . Pain and numbness of upper extremity 02/08/2019  . Insomnia 02/08/2019  . Hyperlipidemia LDL goal <70 01/22/2019  . CAD (coronary artery disease), native coronary artery 01/22/2019  . Unstable angina ([place]) 01/22/2019  . Ascending aortic aneurysm ([place]) 12/07/2018  . Noninfectious gastroenteritis 10/27/2018  . 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 Chest pain 10/15/2018  . Non-ST elevation (NSTEMI) myocardial infarction ([place])   . Chest pain with moderate risk for cardiac etiology 06/15/2018  . Atrial flutter ([place]) 06/15/2018  . Somniloquy 06/12/2018  . Encounter for therapeutic drug monitoring 05/27/2018  . Acute on chronic systolic heart failure ([place])   . Murmur, cardiac 02/13/2018  . Mallory-Weiss tear 01/16/2018  .  Morbid obesity ([place])   . Fall   . Subtherapeutic international normalized ratio (INR)   . History of left below knee amputation ([place]) 12/11/2017  . History of supraventricular tachycardia   . S/P bilateral BKA (below knee amputation) ([place])   . Anemia of chronic disease    . Non-ischemic cardiomyopathy ([place])   . Hypokalemia 09/04/2017  . Essential hypertension 09/03/2017  . GERD (gastroesophageal reflux disease) 09/03/2017  . GIB (gastrointestinal bleeding) 07/26/2017  . Anemia due to end stage renal disease ([place]) 07/26/2017  . PAF (paroxysmal atrial fibrillation) ([place]) 07/26/2017  . Symptomatic anemia 07/26/2017  . End stage renal disease ([place]) 05/05/2017  . HIV disease ([place]) 03/25/2017  . Hypertensive heart disease with end stage renal disease on dialysis ([place]) 03/13/2017  . ESRD on dialysis ([place]) 03/13/2017  . Type 2 diabetes mellitus with complication ([place]) [id number]   PCP:  [name], MD Pharmacy:   [facility] [address] ([address]), [place] - [id number] [place] ([place]) [place] [address] Phone: [phone] Fax: [phone]  [place], [place] [address] Phone: [phone] Fax: [phone]     Social Determinants of Health (SDOH) Interventions    Readmission Risk Interventions Readmission Risk Prevention Plan 10/18/2019  Transportation Screening Complete  Palliative Care Screening Not Applicable  Medication Review (RN Care Manager) Complete  Some recent data might be hidden

## 2019-10-18 NOTE — Progress Notes (Signed)
ANTICOAGULATION CONSULT NOTE - Follow Up Consult  Pharmacy Consult for heparin Indication: chest pain/ACS  Allergies  Allergen Reactions  . Oxycodone-Acetaminophen Other (See Comments)    Hallucinations/delirum    Patient Measurements: Height: 6\' 5"  (195.6 cm) Weight: 272 lb 4.3 oz (123.5 kg) IBW/kg (Calculated) : 89.1 Heparin Dosing Weight: 117 kg  Vital Signs: Temp: 98.1 F (36.7 C) (10/19 1114) Temp Source: Oral (10/19 1114) BP: 105/82 (10/19 1104) Pulse Rate: 70 (10/19 1114)  Labs: Recent Labs    10/16/19 1505 10/16/19 1545 10/16/19 2255 10/17/19 0126 10/17/19 0541  10/17/19 1344 10/17/19 1945 10/18/19 0336 10/18/19 0503 10/18/19 1101  HGB 14.5  --   --  13.4  --   --   --   --  12.5*  --   --   HCT 41.8  --   --  38.3*  --   --   --   --  37.2*  --   --   PLT 141*  --   --  140*  --   --   --   --  141*  --   --   LABPROT  --  21.0*  --   --   --   --   --   --   --   --   --   INR  --  1.8*  --   --   --   --   --   --   --   --   --   HEPARINUNFRC  --   --   --  0.20*  --    < >  --  0.37 0.26*  --  0.60  CREATININE 10.79*  --   --  12.09*  --   --   --   --   --  14.19*  --   TROPONINIHS 153*  --  522*  --  686*  --  580*  --   --   --   --    < > = values in this interval not displayed.    Estimated Creatinine Clearance: 8.8 mL/min (A) (by C-G formula based on SCr of 14.19 mg/dL (H)).    Assessment: 62 yom presented with unstable angina, hx of PAF on warfarin PTA (home dose: 3.75 mg daily). Started on heparin infusion d/t sub-therapeutic INR of 1.8 at admission and holding warfarin due to possible cath 10/19. Positive trops.   Heparin now therapeutic.  Goal of Therapy:  Heparin level 0.3-0.7 units/ml Monitor platelets by anticoagulation protocol: Yes   Plan:  -Continue heparin 2050 units/hr -Daily heparin level and CBC  Thank you Anette Guarneri, PharmD Please check AMION for all [facility] Pharmacy numbers 10/18/2019

## 2019-10-18 NOTE — Progress Notes (Addendum)
The patient has been seen in conjunction with Reino Bellis, NP. All aspects of care have been considered and discussed. The patient has been personally interviewed, examined, and all clinical data has been reviewed.   Reviewed prior coronary angiogram images.  Severe diffuse coronary calcification involving all 3 coronaries.  Severe diffuse disease throughout the mid to distal LAD which if treated would require extensive stenting to near the apex preceded by atheroablation.  There is no rise or fall in troponin.  Considering overall cardiac condition, end-stage kidney disease, prior amputations, history of atrial arrhythmias requiring anticoagulation, and need for prolonged triple/double drug therapy for stent and embolic protection, medical therapy until absolutely necessary to attempt intervention seems most reasonable in this situation.  Overall prognosis is poor.  He is relatively pain-free, underwent dialysis without chest pain, has no EKG changes of ischemia.  I would recommend continue conservative management.  I have asked him to use nitroglycerin for recurring episodes of chest pain.  This will help [place] quantitate how often pain is occurring.   Progress Note  Patient Name: Thomas [name] Date of Encounter: 10/18/2019  Primary Cardiologist: Fransico Him, MD   Subjective   No chest pain overnight. Currently receiving HD.   Inpatient Medications    Scheduled Meds: . amiodarone  200 mg Oral Daily  . aspirin  81 mg Oral Daily  . bictegravir-emtricitabine-tenofovir AF  1 tablet Oral Daily  . Chlorhexidine Gluconate Cloth  6 each Topical Daily  . Chlorhexidine Gluconate Cloth  6 each Topical Q0600  . [START ON 10/19/2019] gabapentin  100 mg Oral Q T,Th,Sat-1800  . insulin aspart  0-9 Units Subcutaneous Q4H  . insulin glargine  7 Units Subcutaneous Daily  . metoprolol succinate  100 mg Oral Daily  . ranolazine  500 mg Oral Daily  . rosuvastatin  20 mg Oral Daily    Continuous Infusions: . heparin 2,050 Units/hr (10/17/19 1210)   PRN Meds: acetaminophen, nitroGLYCERIN, ondansetron (ZOFRAN) IV, traMADol   Vital Signs    Vitals:   10/17/19 2348 10/18/19 0517 10/18/19 0525 10/18/19 0800  BP: (!) 139/96  (!) 136/95 (P) 112/76  Pulse: 70  66   Resp: 18  17   Temp: 98 F (36.7 C)  98.4 F (36.9 C)   TempSrc: Oral  Oral   SpO2: 97%  99%   Weight:  126 kg    Height:        Intake/Output Summary (Last 24 hours) at 10/18/2019 0910 Last data filed at 10/17/2019 2100 Gross per 24 hour  Intake 1389.58 ml  Output -  Net 1389.58 ml   Last 3 Weights 10/18/2019 10/17/2019 10/16/2019  Weight (lbs) 277 lb 11.2 oz 278 lb 3.2 oz 278 lb 8 oz  Weight (kg) 125.964 kg 126.191 kg 126.327 kg      Telemetry    SR - Personally Reviewed  ECG    No new tracing this morning.  Physical Exam  Pleasant younger AAM, sitting up in bed. GEN: No acute distress.   Neck: No JVD Cardiac: RRR, no murmurs, rubs, or gallops.  Respiratory: Clear to auscultation bilaterally. GI: Soft, nontender, non-distended  MS: bilateral AKA Neuro:  Nonfocal  Psych: Normal affect   Labs    High Sensitivity Troponin:   Recent Labs  Lab 10/16/19 1505 10/16/19 2255 10/17/19 0541 10/17/19 1344  TROPONINIHS 153* 522* 686* 580*      Chemistry Recent Labs  Lab 10/16/19 1505 10/17/19 0126 10/18/19 0503  NA 138 136  137  K 4.1 3.7 4.0  CL 99 99 98  CO2 22 19* 20*  GLUCOSE 193* 240* 226*  BUN 54* 66* 83*  CREATININE 10.79* 12.09* 14.19*  CALCIUM 9.3 9.1 9.1  GFRNONAA 5* 4* 3*  GFRAA 6* 5* 4*  ANIONGAP 17* 18* 19*     Hematology Recent Labs  Lab 10/16/19 1505 10/17/19 0126 10/18/19 0336  WBC 8.0 5.4 5.2  RBC 4.14* 3.79* 3.70*  HGB 14.5 13.4 12.5*  HCT 41.8 38.3* 37.2*  MCV 101.0* 101.1* 100.5*  MCH 35.0* 35.4* 33.8  MCHC 34.7 35.0 33.6  RDW 14.5 14.5 14.3  PLT 141* 140* 141*    BNPNo results for input(s): BNP, PROBNP in the last 168 hours.    DDimer No results for input(s): DDIMER in the last 168 hours.   Radiology    Dg Chest Portable 1 View  Result Date: 10/16/2019 CLINICAL DATA:  Acute chest pain EXAM: PORTABLE CHEST 1 VIEW COMPARISON:  06/25/2019 and prior chest radiographs FINDINGS: Cardiomegaly and mild basilar atelectasis noted. A RIGHT IJ central venous catheter is noted with tip overlying the LOWER SVC. There is no evidence of focal airspace disease, pulmonary edema, suspicious pulmonary nodule/mass, pleural effusion, or pneumothorax. No acute bony abnormalities are identified. IMPRESSION: 1. Cardiomegaly with mild bibasilar atelectasis. Electronically Signed   By: Margarette Canada M.D.   On: 10/16/2019 16:06    Cardiac Studies   Cardiac catheterization 01/25/2019:  Prox LAD lesion is 30% stenosed.  Ost 3rd Diag lesion is 95% stenosed.  Mid LAD lesion is 80% stenosed.  Dist LAD lesion is 99% stenosed.  Ost 1st Diag lesion is 100% stenosed.  Acute Mrg lesion is 95% stenosed.  1st RPLB lesion is 100% stenosed.  Post Atrio lesion is 95% stenosed.  LV end diastolic pressure is mildly elevated.  1. Severe 2 vessel obstructive CAD - 80% mid LAD - 99% distal LAD and third diagonal - 100% first diagonal - 100% first PLOM, 95% ongoing PL branch - 90% RV marginal branch. 2. Mildly elevated LVEDP 24 mm Hg.  Plan: Compared to June 2019 there is no significant change. The only lesion that is amenable to PCI is the mid LAD. This would be a complex PCI with need for atherectomy and stenting due to severe calcification. It would also require DAPT as well as coumadin increasing bleeding risk. As noted in prior cath note I don't think treatment of this lesion would add to his overall prognosis. The patient has only had 2 episodes of angina since last June. One occurred last October when he became hypotensive during dialysis. His recent symptoms are clearly related to development of atrial flutter and his  symptoms resolved with restoration of NSR. This would also argue for continued medical management.   Echocardiogram 01/24/2019: - Left ventricle: The cavity size was mildly dilated. Wall thickness was increased in a pattern of severe LVH. Systolic function was moderately reduced. The estimated ejection fraction was in the range of 35% to 40%. Diffuse hypokinesis. Doppler parameters are consistent with restrictive physiology, indicative of decreased left ventricular diastolic compliance and/or increased left atrial pressure. Doppler parameters are consistent with high ventricular filling pressure. - Aortic valve: There was mild regurgitation. - Aortic root: The aortic root was mildly dilated. - Ascending aorta: The ascending aorta was mildly dilated. - Mitral valve: Calcified annulus. There was mild regurgitation. - Left atrium: The atrium was severely dilated. - Pulmonary arteries: Systolic pressure was mildly increased. PA peak pressure: 42 mm  Hg (S).  Impressions:  - Moderate global reduction in LV systolic function (EF 40); severe LVH; restrictive filling; mild LVE; mild AI; mildly dilated aortic root/ascending aorta; mild MR; severe LAE; mild TR with mild pulmonary hypertension.  Patient Profile     53 y.o. male with a history of S&D CHF, atrial flutter on amiodarone and coumadin, CAD with last cath 12/2018 medical management, ESRD on HD, DM, HTN, HLD, Bilateral BKA, HIV, PAD now presenting with unstable angina.  Assessment & Plan    1.  Unstable angina:  High-sensitivity troponin peaked at 686. EKG without acute ischemia. No chest pain overnight. Cath 12/2018 noted above with heavily calcified lesion in the mLAD needing arthrectomy if intervention done. Would also need triple therapy given his need for coumadin, hx of Mallory Weiss tear. Complex situation. Would favor medical management of CAD at this time. Has been on Toprol and Ranexa. Consider switching  Raxena to nitrates, maybe on non HD days if not able to tolerate with HD given his CAD. Unclear data on Raxena in HD patients.  2.  Multivessel CAD:  Cardiac catheterization from January resulted in recommendation for medical therapy. Recommendations noted above.  3.  ESRD on hemodialysis: nephrology following.  4.  Paroxysmal atrial flutter: maintaining sinus rhythm and recently on Coumadin although subtherapeutic and at present on heparin. Coumadin has been held, will review with MD about resuming if no plans for invasive procedure.   5.  Hyperlipidemia: on statin therapy.  6.  Ascending aortic aneurysm of 43 mm as of January.  7.  History of dilated cardiomyopathy: LVEF 35 to 40%. Volume management per HD.   8. HIV: on Biktarvy  For questions or updates, please contact [place] Please consult [email or web address] for contact info under        Signed, Reino Bellis, NP  10/18/2019, 9:10 AM

## 2019-10-18 NOTE — Progress Notes (Signed)
Inpatient Diabetes Program Recommendations  AACE/ADA: New Consensus Statement on Inpatient Glycemic Control (2015)  Target Ranges:  Prepandial:   less than 140 mg/dL      Peak postprandial:   less than 180 mg/dL (1-2 hours)      Critically ill patients:  140 - 180 mg/dL   Lab Results  Component Value Date   GLUCAP 196 (H) 10/18/2019   HGBA1C 11.9 (H) 01/23/2019    Review of Glycemic Control Results for [name], Thomas [name] (MRN [id number]) as of 10/18/2019 11:20  Ref. Range 10/17/2019 20:22 10/18/2019 00:00 10/18/2019 04:40 10/18/2019 11:16  Glucose-Capillary Latest Ref Range: 70 - 99 mg/dL 218 (H) 287 (H) 198 (H) 196 (H)   Diabetes history: Type 2 DM Outpatient Diabetes medications: Lantus 30 units QHS, Humalog 10 units BID, Tradjenta 5 mg QD, Rybelsus 14 mg QD Current orders for Inpatient glycemic control: Lantus 7 units QD, Novolog 0-9 units Q4H  Inpatient Diabetes Program Recommendations:    With diet order placed, consider changing correction to Novolog 0-9 units TID & HS.   Thanks, Bronson Curb, MSN, RNC-OB Diabetes Coordinator [phone] (8a-5p)

## 2019-10-18 NOTE — TOC Transition Note (Signed)
Transition of Care [facility]) - CM/SW Discharge Note   Patient Details  Name: Thomas [name] MRN: [id number] Date of Birth: [birth year]  Transition [facility]) CM/SW Contact:  Zenon Mayo, RN Phone Number: 10/18/2019, 7:20 PM   Clinical Narrative:    Patient is dc today,NCM contacted patient by phone, he states he has no problem getting medications or any other issues.      Final next level of care: Home/Self Care Barriers to Discharge: No Barriers Identified   Patient Goals and CMS Choice Patient states their goals for this hospitalization and ongoing recovery are:: relax and watch the football games   Choice offered to / list presented to : NA  Discharge Placement                       Discharge Plan and Services   Discharge Planning Services: CM Consult Post Acute Care Choice: NA          DME Arranged: (NA)         HH Arranged: NA          Social Determinants of Health (SDOH) Interventions     Readmission Risk Interventions Readmission Risk Prevention Plan 10/18/2019  Transportation Screening Complete  Palliative Care Screening Not Applicable  Medication Review (RN Care Manager) Complete  Some recent data might be hidden

## 2019-10-19 ENCOUNTER — Telehealth: Payer: Self-pay | Admitting: Nephrology

## 2019-10-19 NOTE — Telephone Encounter (Signed)
Transition of care contact from inpatient facility  Date of discharge: 10/18/2019 Date of contact: 10/19/2019 Method of contact: phone Talked to his wife  Patient contacted to discuss transition of care from recent inpatient hospitalization. Patient was admitted to [facility] from 10/17-10/19 with discharge diagnosis of unstable angina.    Patient followed up with his outpatient dialysis center today and will be seen there again on 10/21/19.  Medication changes were reviewed.   Other follow up needs include: none   Jen Mow, [facility] Pager: [phone]

## 2019-10-20 ENCOUNTER — Telehealth: Payer: Self-pay | Admitting: Pharmacist

## 2019-10-20 NOTE — Progress Notes (Signed)
Diabetes Management Follow Up Thomas [name] is a 53 y.o. male who was contacted for DM management. Identity was verified using date of birth and address.  Diabetes medications: reports currently taking Rybelsus (semaglutide) 14 mg daily, and Lantus (insulin glargine) 10 units daily. Medication list was updated to list current regimen (removed insulin lispro and linagliptin)  Reported home BG averaging 160s-180s. Patient states he checks BG every morning after waking up and 15 minutes after eating.   A/P Blood glucose control: improved, A1C 01/23/19 was 11.9%, A1C on 10/16/19 was 9.4%  No medication changes were made today due to dose increase on Rybelsus from 7 mg to 14 mg this week. We reviewed appropriate home blood glucose monitoring.  Advised to contact clinic or seek medical attention if concerns or symptoms arise. Will notify PCP of findings.  The patient verbalized understanding of information provided by repeating back concepts discussed.  Follow-up No follow-ups on file.  [name]

## 2019-10-22 ENCOUNTER — Other Ambulatory Visit: Payer: Self-pay

## 2019-10-22 ENCOUNTER — Ambulatory Visit (INDEPENDENT_AMBULATORY_CARE_PROVIDER_SITE_OTHER): Payer: Medicare Other | Admitting: Pharmacist

## 2019-10-22 DIAGNOSIS — Z5181 Encounter for therapeutic drug level monitoring: Secondary | ICD-10-CM | POA: Diagnosis not present

## 2019-10-22 DIAGNOSIS — I4891 Unspecified atrial fibrillation: Secondary | ICD-10-CM

## 2019-10-22 LAB — POCT INR: INR: 1.9 — AB (ref 2.0–3.0)

## 2019-10-22 NOTE — Patient Instructions (Signed)
Take 1 tablet today then continue taking 1/2 tablet daily. Recheck INR in 2 weeks. Call coumadin clinic with any medication changes or upcoming procedures.

## 2019-10-26 ENCOUNTER — Other Ambulatory Visit: Payer: Self-pay

## 2019-10-26 DIAGNOSIS — I998 Other disorder of circulatory system: Secondary | ICD-10-CM

## 2019-10-28 ENCOUNTER — Telehealth: Payer: Self-pay | Admitting: *Deleted

## 2019-10-28 NOTE — Telephone Encounter (Signed)
Per dr [name], gave pt 1 box of RYBELSUS and 25.00 from patient assistance fund

## 2019-10-29 ENCOUNTER — Ambulatory Visit: Payer: Medicare Other | Admitting: Family

## 2019-10-29 ENCOUNTER — Other Ambulatory Visit: Payer: Self-pay

## 2019-10-29 ENCOUNTER — Ambulatory Visit (HOSPITAL_COMMUNITY)
Admission: RE | Admit: 2019-10-29 | Discharge: 2019-10-29 | Disposition: A | Discharge status: Home / Self Care | Payer: Medicare Other | Source: Ambulatory Visit | Attending: Family | Admitting: Family

## 2019-10-29 DIAGNOSIS — I998 Other disorder of circulatory system: Secondary | ICD-10-CM | POA: Diagnosis present

## 2019-11-01 ENCOUNTER — Ambulatory Visit: Payer: Medicare Other | Admitting: Family

## 2019-11-02 MED FILL — BIKTARVY 50-200-25 MG TABS: 50-200-25 | 30 days supply | Qty: 30 | Fill #8

## 2019-11-05 ENCOUNTER — Ambulatory Visit (INDEPENDENT_AMBULATORY_CARE_PROVIDER_SITE_OTHER): Payer: Medicare Other | Admitting: Family

## 2019-11-05 ENCOUNTER — Other Ambulatory Visit: Payer: Self-pay

## 2019-11-05 ENCOUNTER — Encounter: Payer: Self-pay | Admitting: Family

## 2019-11-05 ENCOUNTER — Other Ambulatory Visit (HOSPITAL_COMMUNITY)
Admission: RE | Admit: 2019-11-05 | Discharge: 2019-11-05 | Disposition: A | Discharge status: Home / Self Care | Payer: Medicare Other | Source: Ambulatory Visit | Attending: Vascular Surgery | Admitting: Vascular Surgery

## 2019-11-05 ENCOUNTER — Encounter: Payer: Self-pay | Admitting: *Deleted

## 2019-11-05 ENCOUNTER — Other Ambulatory Visit: Payer: Self-pay | Admitting: *Deleted

## 2019-11-05 VITALS — BP 97/66 | HR 82 | Temp 98.3°F | Resp 16 | Ht 77.0 in | Wt 273.2 lb

## 2019-11-05 DIAGNOSIS — Z992 Dependence on renal dialysis: Secondary | ICD-10-CM | POA: Diagnosis not present

## 2019-11-05 DIAGNOSIS — I200 Unstable angina: Secondary | ICD-10-CM | POA: Diagnosis not present

## 2019-11-05 DIAGNOSIS — Z20828 Contact with and (suspected) exposure to other viral communicable diseases: Secondary | ICD-10-CM | POA: Diagnosis not present

## 2019-11-05 DIAGNOSIS — I998 Other disorder of circulatory system: Secondary | ICD-10-CM

## 2019-11-05 DIAGNOSIS — I770 Arteriovenous fistula, acquired: Secondary | ICD-10-CM | POA: Diagnosis not present

## 2019-11-05 DIAGNOSIS — N186 End stage renal disease: Secondary | ICD-10-CM | POA: Diagnosis not present

## 2019-11-05 DIAGNOSIS — Z01812 Encounter for preprocedural laboratory examination: Secondary | ICD-10-CM | POA: Diagnosis present

## 2019-11-05 LAB — SARS CORONAVIRUS 2 (TAT 6-24 HRS): SARS Coronavirus 2: NEGATIVE

## 2019-11-05 NOTE — H&P (View-Only) (Signed)
CC: worsening ulcer left 3rd finger tip, pain an numbness left hand, left arm AVF  History of Present Illness  Thomas [name] is a 53 y.o. (19-Feb-1966) male who is s/p creation of left radiocephalic AV fistula and ligation of right radiocephalic AV fistula (steal symptoms in the right arm and nonhealing wound of right thumb followed byhand surgery) by Dr. Scot Dock, and placement of tunneled dialysis catheter dictated separately by Dr. Trula Slade on 06-25-19.  Pt was last evaluated on 08-09-19 by M. Eveland PA-C. At that timeleft radiocephalic fistula was patentwithout signs or symptoms of steal syndrome.  At that time pt was dialyzing via new tunneled dialysis catheter on a Tuesday Thursday Saturday schedule. He denied any signs or symptoms of steal syndrome in his left hand. Incision left wrist was well-healed. Plan was to recheck fistula duplex in 1 month;patient may require sidebranch ligation and/or fistulogram prior to use of fistula. Pt was to continue HD via [facility] for the time being.  He retuns today with c/o 2 months history of worsening ulcer at left 3rd finger tip. His entire left hand feels numb and painful, per pt.   He is still dialyzing via right IJ TDC on TTS.   His right forearm access was his first access, this was ligated due to right thumb ulcer which has healed.   Past Medical History:  Diagnosis Date  . Anemia   . Ascending aortic aneurysm ([place])   . CAD (coronary artery disease), native coronary artery    Severe mulitvessel disease  . Chronic combined systolic and diastolic CHF (congestive heart failure) ([place])   . Diabetic wet gangrene of the foot ([place]) 09/04/2017  . ESRD (end stage renal disease) on dialysis [facility])    [address] T, Th, [place] (10/15/2018)  . Essential hypertension   . Headache   . HIV disease ([place])   . Hyperparathyroidism, secondary ([place])   . Hypertensive heart disease with end stage renal disease on dialysis ([place])   . Mallory-Weiss tear  2018  . Mixed hyperlipidemia   . PAF (paroxysmal atrial fibrillation) ([place])   . Paroxysmal atrial flutter ([place])   . Pneumonia X 1  . QT prolongation   . Subacute osteomyelitis, right ankle and foot ([place])   . SVT (supraventricular tachycardia) ([place])    ? afib or atrial flutter s/p TEE/DCCV with subsequent ablation due to reoccurrence in [place]  . Type 2 diabetes mellitus ([place])   . Wears glasses     Social History Social History   Tobacco Use  . Smoking status: Never Smoker  . Smokeless tobacco: Never Used  Substance Use Topics  . Alcohol use: Never    Frequency: Never  . Drug use: Never    Family History Family History  Problem Relation Age of Onset  . Heart disease Mother   . Diabetes Mother   . Heart disease Father   . Cancer Neg Hx     Surgical History Past Surgical History:  Procedure Laterality Date  . AMPUTATION Left    foot  . AMPUTATION Right 12/06/2017   Procedure: AMPUTATION BELOW KNEE;  Surgeon: [name], MD;  Location: [place];  Service: Orthopedics;  Laterality: Right;  . APPLICATION OF WOUND VAC Right 09/04/2017   Procedure: APPLICATION OF WOUND VAC;  Surgeon: [name], DPM;  Location: [place];  Service: Podiatry;  Laterality: Right;  . APPLICATION OF WOUND VAC  12/06/2017   Procedure: APPLICATION OF WOUND VAC;  Surgeon: Meridee Score  V, MD;  Location: [place];  Service: Orthopedics;;  . AV FISTULA PLACEMENT Right 10/19/2012  . AV FISTULA PLACEMENT Left 06/25/2019   Procedure: Arteriovenous (Av) Fistula Creation left arm.;  Surgeon: Angelia Mould, MD;  Location: [place];  Service: Vascular;  Laterality: Left;  . AV FISTULA REPAIR Right ~ 02/2018   "had it cleaned out"  . BELOW KNEE LEG AMPUTATION Left ~ 2016  . COLONOSCOPY WITH PROPOFOL N/A 07/30/2017   Procedure: COLONOSCOPY WITH PROPOFOL;  Surgeon: [name], MD;  Location: [place];  Service: Endoscopy;  Laterality: N/A;  . ESOPHAGOGASTRODUODENOSCOPY (EGD) WITH PROPOFOL N/A 07/28/2017    Procedure: ESOPHAGOGASTRODUODENOSCOPY (EGD) WITH PROPOFOL;  Surgeon: [name], MD;  Location: [place];  Service: Endoscopy;  Laterality: N/A;  . ESOPHAGOGASTRODUODENOSCOPY (EGD) WITH PROPOFOL N/A 01/15/2018   Procedure: ESOPHAGOGASTRODUODENOSCOPY (EGD) WITH PROPOFOL;  Surgeon: [name], MD;  Location: [facility] ENDOSCOPY;  Service: Endoscopy;  Laterality: N/A;  . FLEXIBLE SIGMOIDOSCOPY N/A 07/27/2017   Procedure: FLEXIBLE SIGMOIDOSCOPY;  Surgeon: [name], MD;  Location: [facility] ENDOSCOPY;  Service: Endoscopy;  Laterality: N/A;  . GRAFT APPLICATION Right [id number]   Procedure: SKIN GRAFT APPLICATION RIGHT FOOT;  Surgeon: [name], DPM;  Location: [place];  Service: Podiatry;  Laterality: Right;  . I&D EXTREMITY Right 09/04/2017   Procedure: IRRIGATION AND DEBRIDEMENT EXTREMITY;  Surgeon: [name], DPM;  Location: [place];  Service: Podiatry;  Laterality: Right;  . I&D EXTREMITY Right 11/12/2017   Procedure: IRRIGATION AND DEBRIDEMENT ULCER RIGHT FOOT;  Surgeon: [name], DPM;  Location: [place];  Service: Podiatry;  Laterality: Right;  . INSERTION OF DIALYSIS CATHETER Right 06/25/2019   Procedure: INSERTION OF TUNNELED  DIALYSIS CATHETER Right Internal Jugular.;  Surgeon: Serafina [name], MD;  Location: [place];  Service: Vascular;  Laterality: Right;  . LEFT HEART CATH AND CORONARY ANGIOGRAPHY N/A 06/18/2018   Procedure: LEFT HEART CATH AND CORONARY ANGIOGRAPHY;  Surgeon: [name], Peter M, MD;  Location: [place] [facility];  Service: Cardiovascular;  Laterality: N/A;  . LEFT HEART CATH AND CORONARY ANGIOGRAPHY N/A 01/25/2019   Procedure: LEFT HEART CATH AND CORONARY ANGIOGRAPHY;  Surgeon: [name], Peter M, MD;  Location: [place] [facility];  Service: Cardiovascular;  Laterality: N/A;  . LIGATION OF ARTERIOVENOUS  FISTULA Right 06/25/2019   Procedure: Ligation Of Arteriovenous  Fistula right arm.;  Surgeon: Angelia Mould, MD;  Location: [place];  Service: Vascular;   Laterality: Right;  . WOUND DEBRIDEMENT N/A 09/24/2017   Procedure: DEBRIDEMENT WOUND;  Surgeon: [name], DPM;  Location: [place];  Service: Podiatry;  Laterality: N/A;    Allergies  Allergen Reactions  . Oxycodone-Acetaminophen Other (See Comments)    Hallucinations/delirum    Current Outpatient Medications  Medication Sig Dispense Refill  . Accu-Chek FastClix Lancets MISC Check blood sugar 3 times a day 306 each 3  . acetaminophen (TYLENOL) 325 MG tablet Take 325 mg by mouth every 6 (six) hours as needed for moderate pain or headache.     [place] Kitchen amiodarone (PACERONE) 200 MG tablet Take 1 tablet by mouth once daily (Patient taking differently: Take 200 mg by mouth daily. ) 30 tablet 6  . aspirin 81 MG chewable tablet Chew 1 tablet (81 mg total) by mouth daily. 90 tablet 1  . bictegravir-emtricitabine-tenofovir AF (BIKTARVY) 50-200-25 MG TABS tablet Take 1 tablet by mouth daily. 30 tablet 11  . cinacalcet (SENSIPAR) 90 MG tablet Take 90 mg by mouth daily.  6  .  ferric citrate (AURYXIA) 1 GM 210 MG(Fe) tablet Take 420-840 mg by mouth See admin instructions. Take 840 mg by mouth three times a day after meals and 420 mg after each snack    . ferric gluconate 125 mg in sodium chloride 0.9 % 100 mL Inject 125 mg into the vein Every Tuesday,Thursday,and Saturday with dialysis.    [place] Kitchen gabapentin (NEURONTIN) 100 MG capsule Take 1 capsule (100 mg total) by mouth every dialysis. 15 capsule 1  . glucose blood (ACCU-CHEK GUIDE) test strip Use as instructed to check blood sugar 3 times a day 300 each 3  . insulin glargine (LANTUS) 100 UNIT/ML injection Inject 10 Units into the skin at bedtime.    . Insulin Pen Needle 32G X 4 MM MISC Use to inject both mealtime and long acting insulins up to 3 injections  a day 300 each 3  . metoprolol succinate (TOPROL-XL) 100 MG 24 hr tablet TAKE 1 TABLET BY MOUTH ONCE DAILY, TAKE WITH OR IMMEDIATELY FOLLOWING A MEAL (Patient taking differently: Take 100 mg by mouth  daily. TAKE 1 TABLET BY MOUTH ONCE DAILY TAKE WITH OR IMMEDIATELY FOLLOWING A MEAL) 90 tablet 2  . nitroGLYCERIN (NITROSTAT) 0.4 MG SL tablet Place 1 tablet (0.4 mg total) under the tongue every 5 (five) minutes as needed for chest pain. 30 tablet 0  . omega-3 acid ethyl esters (LOVAZA) 1 g capsule Take 2 capsules (2 g total) by mouth 2 (two) times daily. 120 capsule 11  . pantoprazole (PROTONIX) 40 MG tablet Take 2 tablets (80 mg total) by mouth daily. 60 tablet 1  . polycarbophil (FIBERCON) 625 MG tablet Take 1 tablet (625 mg total) by mouth daily. 30 tablet 0  . ranolazine (RANEXA) 500 MG 12 hr tablet Take 1 tablet (500 mg total) by mouth 2 (two) times daily. (Patient taking differently: Take 500 mg by mouth daily. ) 60 tablet 0  . rosuvastatin (CRESTOR) 20 MG tablet TAKE 1 TABLET BY MOUTH ONCE DAILY AT 6PM (Patient taking differently: Take 20 mg by mouth daily. ) 90 tablet 0  . Semaglutide (RYBELSUS) 14 MG TABS Take 14 mg by mouth daily. 90 tablet 0  . Skin Protectants, Misc. (EUCERIN) cream Apply topically as needed for dry skin. 454 g 0  . traMADol (ULTRAM) 50 MG tablet Take 1 tablet (50 mg total) by mouth every 6 (six) hours as needed. 20 tablet 0  . warfarin (COUMADIN) 7.5 MG tablet TAKE 1/2 (ONE-HALF) TABLET BY MOUTH ONCE DAILY EXCEPT 1 TABLET ON MONDAY,WEDNESDAY,AND SATURDAY OR AS DIRECTED BY [place] (Patient taking differently: Take 3.75 mg by mouth daily at 6 PM. ) 90 tablet 0   No current facility-administered medications for this visit.      REVIEW OF SYSTEMS: see HPI for pertinent positives and negatives    PHYSICAL EXAMINATION:  Vitals:   11/05/19 0911  BP: 97/66  Pulse: 82  Resp: 16  Temp: 98.3 F (36.8 C)  TempSrc: Temporal  SpO2: 94%  Weight: 273 lb 3.2 oz (123.9 kg)  Height: 6\' 5"  (1.956 m)   Body mass index is 32.4 kg/m.  General: Obese male in NAD HEENT:  No gross abnormalities Pulmonary: Respirations are non-labored, fair air movement in all  fields, no rales, rhonchi, or wheezes Abdomen: Soft and non-tender with normal bowel sounds. Musculoskeletal: There are no major deformities.   Neurologic: No focal weakness or paresthesias are detected, Skin: Necrotic left 3rd finger tip, see photos below Psychiatric: The patient has normal affect.  Cardiovascular: There is a regular rate and rhythm without significant murmur appreciated. Bilateral radial pulses are faintly palpable. Left forearm AVF with faintly palpable thrill, bruit is audible and has a whistling sound on ausculation distally    Left 3rd finger necrotic tip   Left 3rd finger necrosis    Non-Invasive Vascular Imaging  Left upper arm Access Duplex  (Date: 10-29-19):  Findings: +--------------------+----------+-----------------+--------+ AVF                 PSV (cm/s)Flow Vol (mL/min)Comments +--------------------+----------+-----------------+--------+ Native artery inflow   234           368                +--------------------+----------+-----------------+--------+ AVF Anastomosis        141                              +--------------------+----------+-----------------+--------+    +------------+----------+-------------+----------+--------------+ OUTFLOW VEINPSV (cm/s)Diameter (cm)Depth (cm)   Describe    +------------+----------+-------------+----------+--------------+ Prox Forearm    30        0.61                branch 0.41   +------------+----------+-------------+----------+--------------+ Mid Forearm     66        0.58               Retained valve +------------+----------+-------------+----------+--------------+ Dist Forearm    32        0.49                              +------------+----------+-------------+----------+--------------+ Summary: Patent arteriovenous fistula with decreased flow.   Medical Decision Making  Nil Amirian is a 53 y.o. male who presents with 2 months history of worsening ulcer at  left 3rd finger tip. His entire left hand feels numb and painful, per pt.  Pt is s/p creation of left radiocephalic AV fistula and ligation of right radiocephalic AV fistula (steal symptoms in the right arm and nonhealing wound of right thumb followed byhand surgery) by Dr. Scot Dock, and placement of tunneled dialysis catheter dictated separately by Dr. Trula Slade on 06-25-19.  He is still dialyzing via right IJ TDC on TTS.   His right forearm access was his first access, this was ligated due to right thumb ulcer which has healed.   Will schedule for fistula gram, first available [facility] surgeon ASAP, to evaluate and treat possible stenosis of the left forearm AVF, left 3rd finger tip necrosis.    Clemon Chambers, RN, MSN, FNP-C [facility] Office: [phone]  11/05/2019, 9:54 AM  Clinic MD: Early on call

## 2019-11-05 NOTE — Progress Notes (Signed)
CC: worsening ulcer left 3rd finger tip, pain an numbness left hand, left arm AVF  History of Present Illness  Thomas [name] is a 53 y.o. (04-05-1966) male who is s/p creation of left radiocephalic AV fistula and ligation of right radiocephalic AV fistula (steal symptoms in the right arm and nonhealing wound of right thumb followed byhand surgery) by Dr. Scot Dock, and placement of tunneled dialysis catheter dictated separately by Dr. Trula Slade on 06-25-19.  Pt was last evaluated on 08-09-19 by M. Eveland PA-C. At that timeleft radiocephalic fistula was patentwithout signs or symptoms of steal syndrome.  At that time pt was dialyzing via new tunneled dialysis catheter on a Tuesday Thursday Saturday schedule. He denied any signs or symptoms of steal syndrome in his left hand. Incision left wrist was well-healed. Plan was to recheck fistula duplex in 1 month;patient may require sidebranch ligation and/or fistulogram prior to use of fistula. Pt was to continue HD via [facility] for the time being.  He retuns today with c/o 2 months history of worsening ulcer at left 3rd finger tip. His entire left hand feels numb and painful, per pt.   He is still dialyzing via right IJ TDC on TTS.   His right forearm access was his first access, this was ligated due to right thumb ulcer which has healed.   Past Medical History:  Diagnosis Date  . Anemia   . Ascending aortic aneurysm ([place])   . CAD (coronary artery disease), native coronary artery    Severe mulitvessel disease  . Chronic combined systolic and diastolic CHF (congestive heart failure) ([place])   . Diabetic wet gangrene of the foot ([place]) 09/04/2017  . ESRD (end stage renal disease) on dialysis [facility])    [place] [address] T, [place], [place] (10/15/2018)  . Essential hypertension   . Headache   . HIV disease ([place])   . Hyperparathyroidism, secondary ([place])   . Hypertensive heart disease with end stage renal disease on dialysis ([place])   . Mallory-Weiss tear  2018  . Mixed hyperlipidemia   . PAF (paroxysmal atrial fibrillation) ([place])   . Paroxysmal atrial flutter ([place])   . Pneumonia X 1  . QT prolongation   . Subacute osteomyelitis, right ankle and foot ([place])   . SVT (supraventricular tachycardia) ([place])    ? afib or atrial flutter s/p TEE/DCCV with subsequent ablation due to reoccurrence in [place]  . Type 2 diabetes mellitus ([place])   . Wears glasses     Social History Social History   Tobacco Use  . Smoking status: Never Smoker  . Smokeless tobacco: Never Used  Substance Use Topics  . Alcohol use: Never    Frequency: Never  . Drug use: Never    Family History Family History  Problem Relation Age of Onset  . Heart disease Mother   . Diabetes Mother   . Heart disease Father   . Cancer Neg Hx     Surgical History Past Surgical History:  Procedure Laterality Date  . AMPUTATION Left    foot  . AMPUTATION Right 12/06/2017   Procedure: AMPUTATION BELOW KNEE;  Surgeon: [name], MD;  Location: [place];  Service: Orthopedics;  Laterality: Right;  . APPLICATION OF WOUND VAC Right 09/04/2017   Procedure: APPLICATION OF WOUND VAC;  Surgeon: [name], DPM;  Location: [place];  Service: Podiatry;  Laterality: Right;  . APPLICATION OF WOUND VAC  12/06/2017   Procedure: APPLICATION OF WOUND VAC;  Surgeon: Meridee Score  V, MD;  Location: [place];  Service: Orthopedics;;  . AV FISTULA PLACEMENT Right 10/19/2012  . AV FISTULA PLACEMENT Left 06/25/2019   Procedure: Arteriovenous (Av) Fistula Creation left arm.;  Surgeon: Angelia Mould, MD;  Location: [place];  Service: Vascular;  Laterality: Left;  . AV FISTULA REPAIR Right ~ 02/2018   "had it cleaned out"  . BELOW KNEE LEG AMPUTATION Left ~ 2016  . COLONOSCOPY WITH PROPOFOL N/A 07/30/2017   Procedure: COLONOSCOPY WITH PROPOFOL;  Surgeon: [name], MD;  Location: [place];  Service: Endoscopy;  Laterality: N/A;  . ESOPHAGOGASTRODUODENOSCOPY (EGD) WITH PROPOFOL N/A 07/28/2017    Procedure: ESOPHAGOGASTRODUODENOSCOPY (EGD) WITH PROPOFOL;  Surgeon: [name], MD;  Location: [place];  Service: Endoscopy;  Laterality: N/A;  . ESOPHAGOGASTRODUODENOSCOPY (EGD) WITH PROPOFOL N/A 01/15/2018   Procedure: ESOPHAGOGASTRODUODENOSCOPY (EGD) WITH PROPOFOL;  Surgeon: [name], MD;  Location: [facility] ENDOSCOPY;  Service: Endoscopy;  Laterality: N/A;  . FLEXIBLE SIGMOIDOSCOPY N/A 07/27/2017   Procedure: FLEXIBLE SIGMOIDOSCOPY;  Surgeon: [name], MD;  Location: [facility] ENDOSCOPY;  Service: Endoscopy;  Laterality: N/A;  . GRAFT APPLICATION Right [id number]   Procedure: SKIN GRAFT APPLICATION RIGHT FOOT;  Surgeon: [name], DPM;  Location: [place];  Service: Podiatry;  Laterality: Right;  . I&D EXTREMITY Right 09/04/2017   Procedure: IRRIGATION AND DEBRIDEMENT EXTREMITY;  Surgeon: [name], DPM;  Location: [place];  Service: Podiatry;  Laterality: Right;  . I&D EXTREMITY Right 11/12/2017   Procedure: IRRIGATION AND DEBRIDEMENT ULCER RIGHT FOOT;  Surgeon: [name], DPM;  Location: [place];  Service: Podiatry;  Laterality: Right;  . INSERTION OF DIALYSIS CATHETER Right 06/25/2019   Procedure: INSERTION OF TUNNELED  DIALYSIS CATHETER Right Internal Jugular.;  Surgeon: Serafina [name], MD;  Location: [place];  Service: Vascular;  Laterality: Right;  . LEFT HEART CATH AND CORONARY ANGIOGRAPHY N/A 06/18/2018   Procedure: LEFT HEART CATH AND CORONARY ANGIOGRAPHY;  Surgeon: [name], Peter M, MD;  Location: [place] CV [facility];  Service: Cardiovascular;  Laterality: N/A;  . LEFT HEART CATH AND CORONARY ANGIOGRAPHY N/A 01/25/2019   Procedure: LEFT HEART CATH AND CORONARY ANGIOGRAPHY;  Surgeon: [name], Peter M, MD;  Location: [place] [facility];  Service: Cardiovascular;  Laterality: N/A;  . LIGATION OF ARTERIOVENOUS  FISTULA Right 06/25/2019   Procedure: Ligation Of Arteriovenous  Fistula right arm.;  Surgeon: Angelia Mould, MD;  Location: [place];  Service: Vascular;   Laterality: Right;  . WOUND DEBRIDEMENT N/A 09/24/2017   Procedure: DEBRIDEMENT WOUND;  Surgeon: [name], DPM;  Location: [place];  Service: Podiatry;  Laterality: N/A;    Allergies  Allergen Reactions  . Oxycodone-Acetaminophen Other (See Comments)    Hallucinations/delirum    Current Outpatient Medications  Medication Sig Dispense Refill  . Accu-Chek FastClix Lancets MISC Check blood sugar 3 times a day 306 each 3  . acetaminophen (TYLENOL) 325 MG tablet Take 325 mg by mouth every 6 (six) hours as needed for moderate pain or headache.     Marland Kitchen amiodarone (PACERONE) 200 MG tablet Take 1 tablet by mouth once daily (Patient taking differently: Take 200 mg by mouth daily. ) 30 tablet 6  . aspirin 81 MG chewable tablet Chew 1 tablet (81 mg total) by mouth daily. 90 tablet 1  . bictegravir-emtricitabine-tenofovir AF (BIKTARVY) 50-200-25 MG TABS tablet Take 1 tablet by mouth daily. 30 tablet 11  . cinacalcet (SENSIPAR) 90 MG tablet Take 90 mg by mouth daily.  6  .  ferric citrate (AURYXIA) 1 GM 210 MG(Fe) tablet Take 420-840 mg by mouth See admin instructions. Take 840 mg by mouth three times a day after meals and 420 mg after each snack    . ferric gluconate 125 mg in sodium chloride 0.9 % 100 mL Inject 125 mg into the vein Every Tuesday,Thursday,and Saturday with dialysis.    Marland Kitchen gabapentin (NEURONTIN) 100 MG capsule Take 1 capsule (100 mg total) by mouth every dialysis. 15 capsule 1  . glucose blood (ACCU-CHEK GUIDE) test strip Use as instructed to check blood sugar 3 times a day 300 each 3  . insulin glargine (LANTUS) 100 UNIT/ML injection Inject 10 Units into the skin at bedtime.    . Insulin Pen Needle 32G X 4 MM MISC Use to inject both mealtime and long acting insulins up to 3 injections  a day 300 each 3  . metoprolol succinate (TOPROL-XL) 100 MG 24 hr tablet TAKE 1 TABLET BY MOUTH ONCE DAILY, TAKE WITH OR IMMEDIATELY FOLLOWING A MEAL (Patient taking differently: Take 100 mg by mouth  daily. TAKE 1 TABLET BY MOUTH ONCE DAILY TAKE WITH OR IMMEDIATELY FOLLOWING A MEAL) 90 tablet 2  . nitroGLYCERIN (NITROSTAT) 0.4 MG SL tablet Place 1 tablet (0.4 mg total) under the tongue every 5 (five) minutes as needed for chest pain. 30 tablet 0  . omega-3 acid ethyl esters (LOVAZA) 1 g capsule Take 2 capsules (2 g total) by mouth 2 (two) times daily. 120 capsule 11  . pantoprazole (PROTONIX) 40 MG tablet Take 2 tablets (80 mg total) by mouth daily. 60 tablet 1  . polycarbophil (FIBERCON) 625 MG tablet Take 1 tablet (625 mg total) by mouth daily. 30 tablet 0  . ranolazine (RANEXA) 500 MG 12 hr tablet Take 1 tablet (500 mg total) by mouth 2 (two) times daily. (Patient taking differently: Take 500 mg by mouth daily. ) 60 tablet 0  . rosuvastatin (CRESTOR) 20 MG tablet TAKE 1 TABLET BY MOUTH ONCE DAILY AT 6PM (Patient taking differently: Take 20 mg by mouth daily. ) 90 tablet 0  . Semaglutide (RYBELSUS) 14 MG TABS Take 14 mg by mouth daily. 90 tablet 0  . Skin Protectants, Misc. (EUCERIN) cream Apply topically as needed for dry skin. 454 g 0  . traMADol (ULTRAM) 50 MG tablet Take 1 tablet (50 mg total) by mouth every 6 (six) hours as needed. 20 tablet 0  . warfarin (COUMADIN) 7.5 MG tablet TAKE 1/2 (ONE-HALF) TABLET BY MOUTH ONCE DAILY EXCEPT 1 TABLET ON MONDAY,WEDNESDAY,AND SATURDAY OR AS DIRECTED BY [place] (Patient taking differently: Take 3.75 mg by mouth daily at 6 PM. ) 90 tablet 0   No current facility-administered medications for this visit.      REVIEW OF SYSTEMS: see HPI for pertinent positives and negatives    PHYSICAL EXAMINATION:  Vitals:   11/05/19 0911  BP: 97/66  Pulse: 82  Resp: 16  Temp: 98.3 F (36.8 C)  TempSrc: Temporal  SpO2: 94%  Weight: 273 lb 3.2 oz (123.9 kg)  Height: 6\' 5"  (1.956 m)   Body mass index is 32.4 kg/m.  General: Obese male in NAD HEENT:  No gross abnormalities Pulmonary: Respirations are non-labored, fair air movement in all  fields, no rales, rhonchi, or wheezes Abdomen: Soft and non-tender with normal bowel sounds. Musculoskeletal: There are no major deformities.   Neurologic: No focal weakness or paresthesias are detected, Skin: Necrotic left 3rd finger tip, see photos below Psychiatric: The patient has normal affect.  Cardiovascular: There is a regular rate and rhythm without significant murmur appreciated. Bilateral radial pulses are faintly palpable. Left forearm AVF with faintly palpable thrill, bruit is audible and has a whistling sound on ausculation distally    Left 3rd finger necrotic tip   Left 3rd finger necrosis    Non-Invasive Vascular Imaging  Left upper arm Access Duplex  (Date: 10-29-19):  Findings: +--------------------+----------+-----------------+--------+ AVF                 PSV (cm/s)Flow Vol (mL/min)Comments +--------------------+----------+-----------------+--------+ Native artery inflow   234           368                +--------------------+----------+-----------------+--------+ AVF Anastomosis        141                              +--------------------+----------+-----------------+--------+    +------------+----------+-------------+----------+--------------+ OUTFLOW VEINPSV (cm/s)Diameter (cm)Depth (cm)   Describe    +------------+----------+-------------+----------+--------------+ Prox Forearm    30        0.61                branch 0.41   +------------+----------+-------------+----------+--------------+ Mid Forearm     66        0.58               Retained valve +------------+----------+-------------+----------+--------------+ Dist Forearm    32        0.49                              +------------+----------+-------------+----------+--------------+ Summary: Patent arteriovenous fistula with decreased flow.   Medical Decision Making  Thomas [name] is a 53 y.o. male who presents with 2 months history of worsening ulcer at  left 3rd finger tip. His entire left hand feels numb and painful, per pt.  Pt is s/p creation of left radiocephalic AV fistula and ligation of right radiocephalic AV fistula (steal symptoms in the right arm and nonhealing wound of right thumb followed byhand surgery) by Dr. Scot Dock, and placement of tunneled dialysis catheter dictated separately by Dr. Trula Slade on 06-25-19.  He is still dialyzing via right IJ TDC on TTS.   His right forearm access was his first access, this was ligated due to right thumb ulcer which has healed.   Will schedule for fistula gram, first available VVS surgeon ASAP, to evaluate and treat possible stenosis of the left forearm AVF, left 3rd finger tip necrosis.    Clemon Chambers, RN, MSN, FNP-C [facility] [place] Office: [phone]  11/05/2019, 9:54 AM  Clinic MD: Early on call

## 2019-11-08 ENCOUNTER — Encounter (HOSPITAL_COMMUNITY): Payer: Self-pay | Admitting: Vascular Surgery

## 2019-11-08 ENCOUNTER — Encounter (HOSPITAL_COMMUNITY)
Admission: RE | Disposition: A | Discharge status: Home / Self Care | Payer: Self-pay | Source: Home / Self Care | Attending: Vascular Surgery

## 2019-11-08 ENCOUNTER — Ambulatory Visit (HOSPITAL_COMMUNITY)
Admission: RE | Admit: 2019-11-08 | Discharge: 2019-11-08 | Disposition: A | Discharge status: Home / Self Care | Payer: Medicare Other | Attending: Vascular Surgery | Admitting: Vascular Surgery

## 2019-11-08 ENCOUNTER — Other Ambulatory Visit: Payer: Self-pay

## 2019-11-08 ENCOUNTER — Other Ambulatory Visit: Payer: Self-pay | Admitting: *Deleted

## 2019-11-08 DIAGNOSIS — N186 End stage renal disease: Secondary | ICD-10-CM | POA: Diagnosis not present

## 2019-11-08 DIAGNOSIS — E1122 Type 2 diabetes mellitus with diabetic chronic kidney disease: Secondary | ICD-10-CM | POA: Insufficient documentation

## 2019-11-08 DIAGNOSIS — I2510 Atherosclerotic heart disease of native coronary artery without angina pectoris: Secondary | ICD-10-CM | POA: Diagnosis not present

## 2019-11-08 DIAGNOSIS — I132 Hypertensive heart and chronic kidney disease with heart failure and with stage 5 chronic kidney disease, or end stage renal disease: Secondary | ICD-10-CM | POA: Insufficient documentation

## 2019-11-08 DIAGNOSIS — Z794 Long term (current) use of insulin: Secondary | ICD-10-CM | POA: Diagnosis not present

## 2019-11-08 DIAGNOSIS — I5042 Chronic combined systolic (congestive) and diastolic (congestive) heart failure: Secondary | ICD-10-CM | POA: Insufficient documentation

## 2019-11-08 DIAGNOSIS — Z992 Dependence on renal dialysis: Secondary | ICD-10-CM | POA: Diagnosis not present

## 2019-11-08 DIAGNOSIS — E1152 Type 2 diabetes mellitus with diabetic peripheral angiopathy with gangrene: Secondary | ICD-10-CM | POA: Insufficient documentation

## 2019-11-08 DIAGNOSIS — B20 Human immunodeficiency virus [HIV] disease: Secondary | ICD-10-CM | POA: Insufficient documentation

## 2019-11-08 DIAGNOSIS — E782 Mixed hyperlipidemia: Secondary | ICD-10-CM | POA: Diagnosis not present

## 2019-11-08 DIAGNOSIS — I480 Paroxysmal atrial fibrillation: Secondary | ICD-10-CM | POA: Diagnosis not present

## 2019-11-08 DIAGNOSIS — I96 Gangrene, not elsewhere classified: Secondary | ICD-10-CM | POA: Diagnosis not present

## 2019-11-08 DIAGNOSIS — Z7901 Long term (current) use of anticoagulants: Secondary | ICD-10-CM | POA: Insufficient documentation

## 2019-11-08 DIAGNOSIS — Z7982 Long term (current) use of aspirin: Secondary | ICD-10-CM | POA: Diagnosis not present

## 2019-11-08 DIAGNOSIS — Z79899 Other long term (current) drug therapy: Secondary | ICD-10-CM | POA: Insufficient documentation

## 2019-11-08 DIAGNOSIS — L98493 Non-pressure chronic ulcer of skin of other sites with necrosis of muscle: Secondary | ICD-10-CM | POA: Insufficient documentation

## 2019-11-08 HISTORY — PX: A/V FISTULAGRAM: CATH118298

## 2019-11-08 LAB — PROTIME-INR
INR: 1.8 — ABNORMAL HIGH (ref 0.8–1.2)
Prothrombin Time: 20.6 seconds — ABNORMAL HIGH (ref 11.4–15.2)

## 2019-11-08 LAB — GLUCOSE, CAPILLARY: Glucose-Capillary: 243 mg/dL — ABNORMAL HIGH (ref 70–99)

## 2019-11-08 LAB — POCT I-STAT, CHEM 8
BUN: 41 mg/dL — ABNORMAL HIGH (ref 6–20)
Calcium, Ion: 1.24 mmol/L (ref 1.15–1.40)
Chloride: 99 mmol/L (ref 98–111)
Creatinine, Ser: 11.4 mg/dL — ABNORMAL HIGH (ref 0.61–1.24)
Glucose, Bld: 253 mg/dL — ABNORMAL HIGH (ref 70–99)
HCT: 43 % (ref 39.0–52.0)
Hemoglobin: 14.6 g/dL (ref 13.0–17.0)
Potassium: 3.8 mmol/L (ref 3.5–5.1)
Sodium: 139 mmol/L (ref 135–145)
TCO2: 23 mmol/L (ref 22–32)

## 2019-11-08 SURGERY — A/V FISTULAGRAM
Anesthesia: LOCAL | Laterality: Left

## 2019-11-08 MED ORDER — HEPARIN (PORCINE) IN NACL 1000-0.9 UT/500ML-% IV SOLN
INTRAVENOUS | Status: AC
  Filled 2019-11-08: qty 500

## 2019-11-08 MED ORDER — SODIUM CHLORIDE 0.9% FLUSH: 3.0000 mL | Freq: Two times a day (BID) | INTRAVENOUS | Status: DC

## 2019-11-08 MED ORDER — HEPARIN (PORCINE) IN NACL 1000-0.9 UT/500ML-% IV SOLN
INTRAVENOUS | Status: DC | PRN
  Administered 2019-11-08: 500 mL

## 2019-11-08 MED ORDER — SODIUM CHLORIDE 0.9 % IV SOLN: 250.0000 mL | INTRAVENOUS | Status: DC | PRN

## 2019-11-08 MED ORDER — SODIUM CHLORIDE 0.9 % IV SOLN: 100.0000 mL | INTRAVENOUS | Status: DC | PRN

## 2019-11-08 MED ORDER — HEPARIN SODIUM (PORCINE) 1000 UNIT/ML DIALYSIS
1000.0000 [IU] | INTRAMUSCULAR | Status: DC | PRN
  Filled 2019-11-08: qty 1

## 2019-11-08 MED ORDER — LIDOCAINE HCL (PF) 1 % IJ SOLN
5.0000 mL | INTRAMUSCULAR | Status: DC | PRN
  Filled 2019-11-08: qty 5

## 2019-11-08 MED ORDER — LIDOCAINE-PRILOCAINE 2.5-2.5 % EX CREA
1.0000 "application " | TOPICAL_CREAM | CUTANEOUS | Status: DC | PRN
  Filled 2019-11-08: qty 5

## 2019-11-08 MED ORDER — LIDOCAINE HCL (PF) 1 % IJ SOLN
INTRAMUSCULAR | Status: AC
  Filled 2019-11-08: qty 30

## 2019-11-08 MED ORDER — IODIXANOL 320 MG/ML IV SOLN
INTRAVENOUS | Status: DC | PRN
  Administered 2019-11-08: 30 mL

## 2019-11-08 MED ORDER — PENTAFLUOROPROP-TETRAFLUOROETH EX AERO
1.0000 "application " | INHALATION_SPRAY | CUTANEOUS | Status: DC | PRN
  Filled 2019-11-08: qty 116

## 2019-11-08 MED ORDER — ALTEPLASE 2 MG IJ SOLR
2.0000 mg | Freq: Once | INTRAMUSCULAR | Status: DC | PRN
  Filled 2019-11-08: qty 2

## 2019-11-08 MED ORDER — LIDOCAINE HCL (PF) 1 % IJ SOLN
INTRAMUSCULAR | Status: DC | PRN
  Administered 2019-11-08: 3 mL

## 2019-11-08 MED ORDER — SODIUM CHLORIDE 0.9% FLUSH: 3.0000 mL | INTRAVENOUS | Status: DC | PRN

## 2019-11-08 SURGICAL SUPPLY — 8 items
COVER DOME SNAP 22 D (MISCELLANEOUS) ×2 IMPLANT
KIT MICROPUNCTURE NIT STIFF (SHEATH) ×2 IMPLANT
PROTECTION STATION PRESSURIZED (MISCELLANEOUS) ×2
SHEATH PROBE COVER 6X72 (BAG) ×2 IMPLANT
STATION PROTECTION PRESSURIZED (MISCELLANEOUS) ×1 IMPLANT
STOPCOCK MORSE 400PSI 3WAY (MISCELLANEOUS) ×2 IMPLANT
TRAY PV CATH (CUSTOM PROCEDURE TRAY) ×2 IMPLANT
TUBING CIL FLEX 10 FLL-RA (TUBING) ×2 IMPLANT

## 2019-11-08 NOTE — Op Note (Signed)
    Patient name: Thomas [name] MRN: [id number] DOB: Aug 05, 1966 Sex: male  11/08/2019 Pre-operative Diagnosis: Left upper extremity gangrenous changes middle finger Post-operative diagnosis:  Same Surgeon:  Erlene Quan C. Donzetta Matters, MD Procedure Performed: 1.  Ultrasound-guided cannulation left arm AV fistula 2.  Left upper extremity fistulogram  Indications: 53 year old male has previously had ligation of the right arm AV fistula for steal with gangrenous changes to his thumb that are now healed.  He now has gangrenous changes to his left middle finger after placement of radiocephalic fistula.  He is indicated for fistulogram with retrograde angiogram possible intervention.  Findings: Fistula itself is actually quite sclerotic at the initial portion.  Runoff is brisk through the cephalic vein in the forearm feeds a large cephalic vein as well as basilic vein central venous system more cephalad.  Patient will need ligation of fistula after his finger heals we can consider either upper arm fistula or thigh graft.  He is a bilateral below-knee amputee.   Procedure:  The patient was identified in the holding area and taken to room 8.  The patient was then placed supine on the table and prepped and draped in the usual sterile fashion.  A time out was called.  Ultrasound was used to evaluate the left arm AV fistula.  This was cannulated with direct ultrasound visualization a micropuncture needle followed wire and sheath.  And images saved the permanent record.  The upper extremity fistulogram was performed with the above findings.  We also performed retrograde angiography which demonstrated patency of his radial artery is the dominant runoff to his hand also feeling the arch.  We will plan to ligate the left arm AV fistula in the near future.  After his finger heals we can consider thigh graft or upper arm fistula on the left.   Lysa Livengood C. Donzetta Matters, MD [facility] [place] Office:  [phone] Pager: [phone]

## 2019-11-08 NOTE — H&P (Signed)
   History and Physical Update  The patient was interviewed and re-examined.  The patient's previous History and Physical has been reviewed and is unchanged from recent office visit. Plan for left arm fistulogram with possible intervention.   Abdulwahab Demelo C. Donzetta Matters, MD [facility] [place] Office: [phone] Pager: [phone]   11/08/2019, 9:45 AM

## 2019-11-08 NOTE — Discharge Instructions (Signed)

## 2019-11-08 NOTE — Progress Notes (Signed)
[place] ([address]), [place] [id number] [place] [address] Phone: [phone] Fax: [phone]  [place], [place] - [place] [employer] [place] [address] Phone: [phone] Fax: [phone]      Your procedure is scheduled on Wednesday November 11th.  Report to [facility] Main Entrance "A" at 8:30 A.M., and check in at the Admitting office.  Call this number if you have problems the morning of surgery:  [phone]  Call [phone] if you have any questions prior to your surgery date Monday-Friday 8am-4pm    Remember:      Take these medicines the morning of surgery with A SIP OF WATER  amiodarone (PACERONE) cinacalcet (SENSIPAR) metoprolol succinate (TOPROL-XL)   Follow your surgeon's instructions on when to stop Asprin and Warfarin.  If no instructions were given by your surgeon then you will need to call the office to get those instructions.    7 days prior to surgery STOP taking any Aspirin (unless otherwise instructed by your surgeon), Aleve, Naproxen, Ibuprofen, Motrin, Advil, Goody's, BC's, all herbal medications, fish oil, and all vitamins.   HOW TO MANAGE YOUR DIABETES BEFORE AND AFTER SURGERY  Why is it important to control my blood sugar before and after surgery? . Improving blood sugar levels before and after surgery helps healing and can limit problems. . A way of improving blood sugar control is eating a healthy diet by: o  Eating less sugar and carbohydrates o  Increasing activity/exercise o  Talking with your doctor about reaching your blood sugar goals . High blood sugars (greater than 180 mg/dL) can raise your risk of infections and slow your recovery, so you will need to focus on controlling your diabetes during the weeks before surgery. . Make sure that the doctor who takes care of your diabetes knows about your planned surgery  including the date and location.  How do I manage my blood sugar before surgery? . Check your blood sugar at least 4 times a day, starting 2 days before surgery, to make sure that the level is not too high or low. o Check your blood sugar the morning of your surgery when you wake up and every 2 hours until you get to the Short Stay unit. . If your blood sugar is less than 70 mg/dL, you will need to treat for low blood sugar: o Do not take insulin. o Treat a low blood sugar (less than 70 mg/dL) with  cup of clear juice (cranberry or apple), 4 glucose tablets, OR glucose gel. Recheck blood sugar in 15 minutes after treatment (to make sure it is greater than 70 mg/dL). If your blood sugar is not greater than 70 mg/dL on recheck, call [phone] o  for further instructions. . Report your blood sugar to the short stay nurse when you get to Short Stay.  . If you are admitted to the hospital after surgery: o Your blood sugar will be checked by the staff and you will probably be given insulin after surgery (instead of oral diabetes medicines) to make sure you have good blood sugar levels. o The goal for blood sugar control after surgery is 80-180 mg/dL.     WHAT DO I DO ABOUT MY DIABETES MEDICATION?   Marland Kitchen Do not take oral diabetes medicines (pills): bictegravir-emtricitabine-tenofovir AF (BIKTARVY)  the morning of surgery.  . THE NIGHT BEFORE SURGERY, take 5 units of insulin  glargine (LANTUS) .      Marland Kitchen The day of surgery, do not take other diabetes injectables, including Byetta (exenatide), Bydureon (exenatide ER), Victoza (liraglutide), or Trulicity (dulaglutide).     The Morning of Surgery  Do not wear jewelry, make-up or nail polish.  Do not wear lotions, powders, or perfumes/colognes, or deodorant  Do not shave 48 hours prior to surgery.  Men may shave face and neck.  Do not bring valuables to the hospital.  [facility] is not responsible for any belongings or valuables.  If you are a  smoker, DO NOT Smoke 24 hours prior to surgery IF you wear a CPAP at night please bring your mask, tubing, and machine the morning of surgery   Remember that you must have someone to transport you home after your surgery, and remain with you for 24 hours if you are discharged the same day.   Contacts, glasses, hearing aids, dentures or bridgework may not be worn into surgery.    Leave your suitcase in the car.  After surgery it may be brought to your room.  For patients admitted to the hospital, discharge time will be determined by your treatment team.  Patients discharged the day of surgery will not be allowed to drive home.    Special instructions:   [place]- Preparing For Surgery  Before surgery, you can play an important role. Because skin is not sterile, your skin needs to be as free of germs as possible. You can reduce the number of germs on your skin by washing with CHG (chlorahexidine gluconate) Soap before surgery.  CHG is an antiseptic cleaner which kills germs and bonds with the skin to continue killing germs even after washing.    Oral Hygiene is also important to reduce your risk of infection.  Remember - BRUSH YOUR TEETH THE MORNING OF SURGERY WITH YOUR REGULAR TOOTHPASTE  Please do not use if you have an allergy to CHG or antibacterial soaps. If your skin becomes reddened/irritated stop using the CHG.  Do not shave (including legs and underarms) for at least 48 hours prior to first CHG shower. It is OK to shave your face.  Please follow these instructions carefully.   1. Shower the NIGHT BEFORE SURGERY and the MORNING OF SURGERY with CHG Soap.   2. If you chose to wash your hair, wash your hair first as usual with your normal shampoo.  3. After you shampoo, rinse your hair and body thoroughly to remove the shampoo.  4. Use CHG as you would any other liquid soap. You can apply CHG directly to the skin and wash gently with a scrungie or a clean washcloth.    5. Apply the CHG Soap to your body ONLY FROM THE NECK DOWN.  Do not use on open wounds or open sores. Avoid contact with your eyes, ears, mouth and genitals (private parts). Wash Face and genitals (private parts)  with your normal soap.   6. Wash thoroughly, paying special attention to the area where your surgery will be performed.  7. Thoroughly rinse your body with warm water from the neck down.  8. DO NOT shower/wash with your normal soap after using and rinsing off the CHG Soap.  9. Pat yourself dry with a CLEAN TOWEL.  10. Wear CLEAN PAJAMAS to bed the night before surgery, wear comfortable clothes the morning of surgery  11. Place CLEAN SHEETS on your bed the night of your first shower and DO NOT SLEEP WITH PETS.  Day of Surgery:  Do not apply any deodorants/lotions. Please shower the morning of surgery with the CHG soap  Please wear clean clothes to the hospital/surgery center.   Remember to brush your teeth WITH YOUR REGULAR TOOTHPASTE.   Please read over the following fact sheets that you were given.

## 2019-11-09 MED ORDER — DEXTROSE 5 % IV SOLN
3.0000 g | INTRAVENOUS | Status: AC
  Administered 2019-11-10: 3 g via INTRAVENOUS
  Filled 2019-11-09: qty 3000
  Filled 2019-11-09: qty 3

## 2019-11-09 NOTE — Anesthesia Preprocedure Evaluation (Addendum)
Anesthesia Evaluation  Patient identified by MRN, date of birth, ID band Patient awake    Reviewed: Allergy & Precautions, H&P , NPO status , Patient's Chart, lab work & pertinent test results, reviewed documented beta blocker date and time   Airway Mallampati: II  TM Distance: >3 FB Neck ROM: Full    Dental no notable dental hx. (+) Teeth Intact, Dental Advisory Given   Pulmonary sleep apnea ,    Pulmonary exam normal breath sounds clear to auscultation       Cardiovascular hypertension, Pt. on medications and Pt. on home beta blockers + angina at rest + CAD, + Past MI, + Peripheral Vascular Disease and +CHF   Rhythm:Regular Rate:Normal     Neuro/Psych  Headaches, negative psych ROS   GI/Hepatic Neg liver ROS, GERD  Medicated and Controlled,  Endo/Other  diabetes, Insulin Dependent  Renal/GU ESRF and DialysisRenal disease  negative genitourinary   Musculoskeletal   Abdominal   Peds  Hematology  (+) Blood dyscrasia, anemia ,   Anesthesia Other Findings   Reproductive/Obstetrics negative OB ROS                           Anesthesia Physical Anesthesia Plan  ASA: IV  Anesthesia Plan: MAC   Post-op Pain Management:    Induction: Intravenous  PONV Risk Score and Plan: 2 and Propofol infusion, Ondansetron and Midazolam  Airway Management Planned: Simple Face Mask  Additional Equipment:   Intra-op Plan:   Post-operative Plan:   Informed Consent: I have reviewed the patients History and Physical, chart, labs and discussed the procedure including the risks, benefits and alternatives for the proposed anesthesia with the patient or authorized representative who has indicated his/her understanding and acceptance.     Dental advisory given  Plan Discussed with: CRNA  Anesthesia Plan Comments: (Recent admission 10/17-10/19 for unstable angina. Per discharge summary "Pt presented for  chest pain during his dialysis session on Saturday 10/17. He described it as chest pressure that resolved over time and with sublingual nitroglycerin. EKG was remarkable for nonspecific ST segment changes. Last cardiac catheterization in Jan 2020 showed severe 2 vessel obstructive CAD. Only one lesion in the mid LAD is amenable to PCI, which would be complex due to severe calcification requiring atherectomy, stenting, and dual antiplatelet therapy. Troponin peaked at 686 on 10/18. Cardiology evaluated the patient and recommended continued medical management with aspirin, IV heparin, metoprolol, and rosuvastatin. Repeat cardiac catheterization was not pursued due to resolution of pt's chest pain and prior cath showing limited revascularization options. IV heparin was stopped in the afternoon of 10/19 and pt was given a 13m dose of oral warfarin prior to discharge. He was instructed to follow-up in clinic for hospital follow-up and INR recheck this week."  Pt currently has gangrenous changes to his left middle finger and required fistula ligation. [facility]  Pt will need DOS eval and labs.  EKG 10/17/19: Sinus rhythm with 1st degree A-V block. Rate 70. ST & T wave abnormality, consider lateral ischemia  Cardiac catheterization 01/25/2019:  Prox LAD lesion is 30% stenosed.  Ost 3rd Diag lesion is 95% stenosed.  Mid LAD lesion is 80% stenosed.  Dist LAD lesion is 99% stenosed.  Ost 1st Diag lesion is 100% stenosed.  Acute Mrg lesion is 95% stenosed.  1st RPLB lesion is 100% stenosed.  Post Atrio lesion is 95% stenosed.  LV end diastolic pressure is mildly elevated.  1. Severe 2 vessel obstructive CAD - 80% mid LAD - 99% distal LAD and third diagonal - 100% first diagonal - 100% first PLOM, 95% ongoing PL branch - 90% RV marginal branch. 2. Mildly elevated LVEDP 24 mm Hg.  Plan: Compared to June 2019 there is no significant change. The only lesion that is  amenable to PCI is the mid LAD. This would be a complex PCI with need for atherectomy and stenting due to severe calcification. It would also require DAPT as well as coumadin increasing bleeding risk. As noted in prior cath note I don't think treatment of this lesion would add to his overall prognosis. The patient has only had 2 episodes of angina since last June. One occurred last October when he became hypotensive during dialysis. His recent symptoms are clearly related to development of atrial flutter and his symptoms resolved with restoration of NSR. This would also argue for continued medical management.  Echocardiogram 01/24/2019: - Left ventricle: The cavity size was mildly dilated. Wall thickness was increased in a pattern of severe LVH. Systolic function was moderately reduced. The estimated ejection fraction was in the range of 35% to 40%. Diffuse hypokinesis. Doppler parameters are consistent with restrictive physiology, indicative of decreased left ventricular diastolic compliance and/or increased left atrial pressure. Doppler parameters are consistent with high ventricular filling pressure. - Aortic valve: There was mild regurgitation. - Aortic root: The aortic root was mildly dilated. - Ascending aorta: The ascending aorta was mildly dilated. - Mitral valve: Calcified annulus. There was mild regurgitation. - Left atrium: The atrium was severely dilated. - Pulmonary arteries: Systolic pressure was mildly increased. PA peak pressure: 42 mm Hg (S).  Impressions:  - Moderate global reduction in LV systolic function (EF 40); severe LVH; restrictive filling; mild LVE; mild AI; mildly dilated aortic root/ascending aorta; mild MR; severe LAE; mild TR with mild pulmonary hypertension.  )       Anesthesia Quick Evaluation

## 2019-11-09 NOTE — Progress Notes (Signed)
Anesthesia Chart Review: Same Day Workup  Recent admission 10/17-10/19 for unstable angina. Per discharge summary "Pt presented for chest pain during his dialysis session on Saturday 10/17. He described it as chest pressure that resolved over time and with sublingual nitroglycerin. EKG was remarkable for nonspecific ST segment changes. Last cardiac catheterization in Jan 2020 showed severe 2 vessel obstructive CAD. Only one lesion in the mid LAD is amenable to PCI, which would be complex due to severe calcification requiring atherectomy, stenting, and dual antiplatelet therapy. Troponin peaked at 686 on 10/18. Cardiology evaluated the patient and recommended continued medical management with aspirin, IV heparin, metoprolol, and rosuvastatin. Repeat cardiac catheterization was not pursued due to resolution of pt's chest pain and prior cath showing limited revascularization options. IV heparin was stopped in the afternoon of 10/19 and pt was given a 5mg  dose of oral warfarin prior to discharge. He was instructed to follow-up in clinic for hospital follow-up and INR recheck this week."  Pt currently has gangrenous changes to his left middle finger and required fistula ligation. Currently on HD via [facility].  Pt will need DOS eval and labs.  EKG 10/17/19: Sinus rhythm with 1st degree A-V block. Rate 70. ST & T wave abnormality, consider lateral ischemia  Cardiac catheterization 01/25/2019:  Prox LAD lesion is 30% stenosed.  Ost 3rd Diag lesion is 95% stenosed.  Mid LAD lesion is 80% stenosed.  Dist LAD lesion is 99% stenosed.  Ost 1st Diag lesion is 100% stenosed.  Acute Mrg lesion is 95% stenosed.  1st RPLB lesion is 100% stenosed.  Post Atrio lesion is 95% stenosed.  LV end diastolic pressure is mildly elevated.  1. Severe 2 vessel obstructive CAD - 80% mid LAD - 99% distal LAD and third diagonal - 100% first diagonal - 100% first PLOM, 95% ongoing PL branch - 90% RV  marginal branch. 2. Mildly elevated LVEDP 24 mm Hg.  Plan: Compared to June 2019 there is no significant change. The only lesion that is amenable to PCI is the mid LAD. This would be a complex PCI with need for atherectomy and stenting due to severe calcification. It would also require DAPT as well as coumadin increasing bleeding risk. As noted in prior cath note I don't think treatment of this lesion would add to his overall prognosis. The patient has only had 2 episodes of angina since last June. One occurred last October when he became hypotensive during dialysis. His recent symptoms are clearly related to development of atrial flutter and his symptoms resolved with restoration of NSR. This would also argue for continued medical management.  Echocardiogram 01/24/2019: - Left ventricle: The cavity size was mildly dilated. Wall thickness was increased in a pattern of severe LVH. Systolic function was moderately reduced. The estimated ejection fraction was in the range of 35% to 40%. Diffuse hypokinesis. Doppler parameters are consistent with restrictive physiology, indicative of decreased left ventricular diastolic compliance and/or increased left atrial pressure. Doppler parameters are consistent with high ventricular filling pressure. - Aortic valve: There was mild regurgitation. - Aortic root: The aortic root was mildly dilated. - Ascending aorta: The ascending aorta was mildly dilated. - Mitral valve: Calcified annulus. There was mild regurgitation. - Left atrium: The atrium was severely dilated. - Pulmonary arteries: Systolic pressure was mildly increased. PA peak pressure: 42 mm Hg (S).  Impressions:  - Moderate global reduction in LV systolic function (EF 40); severe LVH; restrictive filling; mild LVE; mild AI; mildly dilated aortic root/ascending aorta; mild  MR; severe LAE; mild TR with mild pulmonary hypertension.   Wynonia Musty [facility] Short  Stay Center/Anesthesiology Phone [phone] 11/09/2019 10:20 AM

## 2019-11-09 NOTE — Progress Notes (Signed)
Spoke with pt for pre-op call. Pt was at dialysis and went home and took a nap and didn't return my call. I gave him instructions only. Pt instructed to take 1/2 of his regular dose of Lantus tonight. Do not take Rybelsus in AM. Pt instructed to check his blood sugar when he gets up in the AM. If blood sugar is 70 or below, treat with 1/2 cup of clear juice (apple or cranberry) and recheck blood sugar 15 minutes after drinking juice. If blood sugar continues to be 70 or below, call the Short Stay department and ask to speak to a nurse. He voiced understanding.  Had Covid test done on 11/05/19 and it is neg.

## 2019-11-10 ENCOUNTER — Ambulatory Visit (HOSPITAL_COMMUNITY)
Admission: RE | Admit: 2019-11-10 | Discharge: 2019-11-10 | Disposition: A | Discharge status: Home / Self Care | Payer: Medicare Other | Attending: Vascular Surgery | Admitting: Vascular Surgery

## 2019-11-10 ENCOUNTER — Ambulatory Visit (HOSPITAL_COMMUNITY): Payer: Medicare Other | Admitting: Physician Assistant

## 2019-11-10 ENCOUNTER — Other Ambulatory Visit: Payer: Self-pay

## 2019-11-10 ENCOUNTER — Other Ambulatory Visit: Payer: Self-pay | Admitting: *Deleted

## 2019-11-10 ENCOUNTER — Encounter (HOSPITAL_COMMUNITY)
Admission: RE | Disposition: A | Discharge status: Home / Self Care | Payer: Self-pay | Source: Home / Self Care | Attending: Vascular Surgery

## 2019-11-10 ENCOUNTER — Encounter (HOSPITAL_COMMUNITY): Payer: Self-pay | Admitting: *Deleted

## 2019-11-10 DIAGNOSIS — E782 Mixed hyperlipidemia: Secondary | ICD-10-CM | POA: Diagnosis not present

## 2019-11-10 DIAGNOSIS — Z21 Asymptomatic human immunodeficiency virus [HIV] infection status: Secondary | ICD-10-CM | POA: Diagnosis not present

## 2019-11-10 DIAGNOSIS — I252 Old myocardial infarction: Secondary | ICD-10-CM | POA: Diagnosis not present

## 2019-11-10 DIAGNOSIS — Z79899 Other long term (current) drug therapy: Secondary | ICD-10-CM | POA: Diagnosis not present

## 2019-11-10 DIAGNOSIS — Z89511 Acquired absence of right leg below knee: Secondary | ICD-10-CM | POA: Insufficient documentation

## 2019-11-10 DIAGNOSIS — Z992 Dependence on renal dialysis: Secondary | ICD-10-CM | POA: Insufficient documentation

## 2019-11-10 DIAGNOSIS — I132 Hypertensive heart and chronic kidney disease with heart failure and with stage 5 chronic kidney disease, or end stage renal disease: Secondary | ICD-10-CM | POA: Diagnosis not present

## 2019-11-10 DIAGNOSIS — I96 Gangrene, not elsewhere classified: Secondary | ICD-10-CM

## 2019-11-10 DIAGNOSIS — Z794 Long term (current) use of insulin: Secondary | ICD-10-CM | POA: Diagnosis not present

## 2019-11-10 DIAGNOSIS — Z885 Allergy status to narcotic agent status: Secondary | ICD-10-CM | POA: Diagnosis not present

## 2019-11-10 DIAGNOSIS — E1122 Type 2 diabetes mellitus with diabetic chronic kidney disease: Secondary | ICD-10-CM | POA: Diagnosis not present

## 2019-11-10 DIAGNOSIS — Z7901 Long term (current) use of anticoagulants: Secondary | ICD-10-CM | POA: Insufficient documentation

## 2019-11-10 DIAGNOSIS — N186 End stage renal disease: Secondary | ICD-10-CM | POA: Diagnosis not present

## 2019-11-10 DIAGNOSIS — I5042 Chronic combined systolic (congestive) and diastolic (congestive) heart failure: Secondary | ICD-10-CM | POA: Insufficient documentation

## 2019-11-10 DIAGNOSIS — I2510 Atherosclerotic heart disease of native coronary artery without angina pectoris: Secondary | ICD-10-CM | POA: Diagnosis not present

## 2019-11-10 DIAGNOSIS — T82898A Other specified complication of vascular prosthetic devices, implants and grafts, initial encounter: Secondary | ICD-10-CM | POA: Diagnosis not present

## 2019-11-10 DIAGNOSIS — Y832 Surgical operation with anastomosis, bypass or graft as the cause of abnormal reaction of the patient, or of later complication, without mention of misadventure at the time of the procedure: Secondary | ICD-10-CM | POA: Diagnosis not present

## 2019-11-10 DIAGNOSIS — G4730 Sleep apnea, unspecified: Secondary | ICD-10-CM | POA: Diagnosis not present

## 2019-11-10 DIAGNOSIS — L98499 Non-pressure chronic ulcer of skin of other sites with unspecified severity: Secondary | ICD-10-CM | POA: Insufficient documentation

## 2019-11-10 DIAGNOSIS — E1151 Type 2 diabetes mellitus with diabetic peripheral angiopathy without gangrene: Secondary | ICD-10-CM | POA: Diagnosis not present

## 2019-11-10 DIAGNOSIS — Z89432 Acquired absence of left foot: Secondary | ICD-10-CM | POA: Insufficient documentation

## 2019-11-10 DIAGNOSIS — Z7982 Long term (current) use of aspirin: Secondary | ICD-10-CM | POA: Insufficient documentation

## 2019-11-10 DIAGNOSIS — I480 Paroxysmal atrial fibrillation: Secondary | ICD-10-CM | POA: Diagnosis not present

## 2019-11-10 HISTORY — PX: LIGATION OF ARTERIOVENOUS  FISTULA: SHX5948

## 2019-11-10 LAB — POCT I-STAT, CHEM 8
BUN: 35 mg/dL — ABNORMAL HIGH (ref 6–20)
Calcium, Ion: 1.12 mmol/L — ABNORMAL LOW (ref 1.15–1.40)
Chloride: 98 mmol/L (ref 98–111)
Creatinine, Ser: 9.9 mg/dL — ABNORMAL HIGH (ref 0.61–1.24)
Glucose, Bld: 228 mg/dL — ABNORMAL HIGH (ref 70–99)
HCT: 41 % (ref 39.0–52.0)
Hemoglobin: 13.9 g/dL (ref 13.0–17.0)
Potassium: 3.5 mmol/L (ref 3.5–5.1)
Sodium: 137 mmol/L (ref 135–145)
TCO2: 26 mmol/L (ref 22–32)

## 2019-11-10 LAB — PROTIME-INR
INR: 1.5 — ABNORMAL HIGH (ref 0.8–1.2)
Prothrombin Time: 18 seconds — ABNORMAL HIGH (ref 11.4–15.2)

## 2019-11-10 LAB — GLUCOSE, CAPILLARY
Glucose-Capillary: 229 mg/dL — ABNORMAL HIGH (ref 70–99)
Glucose-Capillary: 213 mg/dL — ABNORMAL HIGH (ref 70–99)
Glucose-Capillary: 225 mg/dL — ABNORMAL HIGH (ref 70–99)

## 2019-11-10 SURGERY — LIGATION OF ARTERIOVENOUS  FISTULA
Anesthesia: Monitor Anesthesia Care | Laterality: Left

## 2019-11-10 MED ORDER — FENTANYL CITRATE (PF) 100 MCG/2ML IJ SOLN
INTRAMUSCULAR | Status: DC | PRN
  Administered 2019-11-10 (×4): 25 ug via INTRAVENOUS

## 2019-11-10 MED ORDER — PROPOFOL 10 MG/ML IV BOLUS
INTRAVENOUS | Status: AC
  Filled 2019-11-10: qty 20

## 2019-11-10 MED ORDER — LIDOCAINE 2% (20 MG/ML) 5 ML SYRINGE
INTRAMUSCULAR | Status: AC
  Filled 2019-11-10: qty 5

## 2019-11-10 MED ORDER — PROPOFOL 500 MG/50ML IV EMUL
INTRAVENOUS | Status: DC | PRN
  Administered 2019-11-10: 75 ug/kg/min via INTRAVENOUS

## 2019-11-10 MED ORDER — LIDOCAINE-EPINEPHRINE 0.5 %-1:200000 IJ SOLN
INTRAMUSCULAR | Status: DC | PRN
  Administered 2019-11-10: 50 mL

## 2019-11-10 MED ORDER — PROPOFOL 10 MG/ML IV BOLUS
INTRAVENOUS | Status: DC | PRN
  Administered 2019-11-10 (×2): 30 mg via INTRAVENOUS
  Administered 2019-11-10 (×3): 20 mg via INTRAVENOUS
  Administered 2019-11-10: 30 mg via INTRAVENOUS

## 2019-11-10 MED ORDER — LIDOCAINE-EPINEPHRINE 0.5 %-1:200000 IJ SOLN
INTRAMUSCULAR | Status: AC
  Filled 2019-11-10: qty 1

## 2019-11-10 MED ORDER — SODIUM CHLORIDE 0.9 % IV SOLN
INTRAVENOUS | Status: DC
  Administered 2019-11-10: 08:00:00 via INTRAVENOUS

## 2019-11-10 MED ORDER — ACETAMINOPHEN 500 MG PO TABS: 1000.0000 mg | ORAL_TABLET | Freq: Once | ORAL | Status: DC

## 2019-11-10 MED ORDER — DEXAMETHASONE SODIUM PHOSPHATE 10 MG/ML IJ SOLN
INTRAMUSCULAR | Status: DC | PRN
  Administered 2019-11-10: 5 mg via INTRAVENOUS

## 2019-11-10 MED ORDER — FENTANYL CITRATE (PF) 250 MCG/5ML IJ SOLN
INTRAMUSCULAR | Status: AC
  Filled 2019-11-10: qty 5

## 2019-11-10 MED ORDER — 0.9 % SODIUM CHLORIDE (POUR BTL) OPTIME
TOPICAL | Status: DC | PRN
  Administered 2019-11-10: 1000 mL

## 2019-11-10 MED ORDER — LIDOCAINE 2% (20 MG/ML) 5 ML SYRINGE
INTRAMUSCULAR | Status: DC | PRN
  Administered 2019-11-10: 50 mg via INTRAVENOUS

## 2019-11-10 MED ORDER — DEXAMETHASONE SODIUM PHOSPHATE 10 MG/ML IJ SOLN
INTRAMUSCULAR | Status: AC
  Filled 2019-11-10: qty 1

## 2019-11-10 MED ORDER — OXYCODONE HCL 5 MG PO TABS: 5.0000 mg | ORAL_TABLET | Freq: Four times a day (QID) | ORAL | 0 refills | Status: DC | PRN

## 2019-11-10 MED ORDER — MIDAZOLAM HCL 2 MG/2ML IJ SOLN
INTRAMUSCULAR | Status: AC
  Filled 2019-11-10: qty 2

## 2019-11-10 MED ORDER — MIDAZOLAM HCL 5 MG/5ML IJ SOLN
INTRAMUSCULAR | Status: DC | PRN
  Administered 2019-11-10: 2 mg via INTRAVENOUS

## 2019-11-10 MED ORDER — ONDANSETRON HCL 4 MG/2ML IJ SOLN
INTRAMUSCULAR | Status: DC | PRN
  Administered 2019-11-10: 4 mg via INTRAVENOUS

## 2019-11-10 MED ORDER — SODIUM CHLORIDE 0.9 % IV SOLN
INTRAVENOUS | Status: AC
  Filled 2019-11-10: qty 1.2

## 2019-11-10 MED ORDER — ONDANSETRON HCL 4 MG/2ML IJ SOLN
INTRAMUSCULAR | Status: AC
  Filled 2019-11-10: qty 2

## 2019-11-10 MED ORDER — FENTANYL CITRATE (PF) 100 MCG/2ML IJ SOLN: 25.0000 ug | INTRAMUSCULAR | Status: DC | PRN

## 2019-11-10 SURGICAL SUPPLY — 30 items
CANISTER SUCT 3000ML PPV (MISCELLANEOUS) ×3 IMPLANT
CLIP LIGATING EXTRA MED SLVR (CLIP) ×3 IMPLANT
CLIP LIGATING EXTRA SM BLUE (MISCELLANEOUS) ×3 IMPLANT
COVER PROBE W GEL 5X96 (DRAPES) ×3 IMPLANT
COVER WAND RF STERILE (DRAPES) ×3 IMPLANT
DECANTER SPIKE VIAL GLASS SM (MISCELLANEOUS) ×3 IMPLANT
DERMABOND ADVANCED (GAUZE/BANDAGES/DRESSINGS) ×2
DERMABOND ADVANCED .7 DNX12 (GAUZE/BANDAGES/DRESSINGS) ×1 IMPLANT
ELECT REM PT RETURN 9FT ADLT (ELECTROSURGICAL) ×3
ELECTRODE REM PT RTRN 9FT ADLT (ELECTROSURGICAL) ×1 IMPLANT
GLOVE BIOGEL PI IND STRL 6.5 (GLOVE) ×3 IMPLANT
GLOVE BIOGEL PI INDICATOR 6.5 (GLOVE) ×6
GLOVE ECLIPSE 6.5 STRL STRAW (GLOVE) ×3 IMPLANT
GLOVE SS BIOGEL STRL SZ 7.5 (GLOVE) ×1 IMPLANT
GLOVE SUPERSENSE BIOGEL SZ 7.5 (GLOVE) ×2
GOWN STRL REUS W/ TWL LRG LVL3 (GOWN DISPOSABLE) ×3 IMPLANT
GOWN STRL REUS W/TWL LRG LVL3 (GOWN DISPOSABLE) ×6
KIT BASIN OR (CUSTOM PROCEDURE TRAY) ×3 IMPLANT
KIT TURNOVER KIT B (KITS) ×3 IMPLANT
NS IRRIG 1000ML POUR BTL (IV SOLUTION) ×3 IMPLANT
PACK CV ACCESS (CUSTOM PROCEDURE TRAY) ×3 IMPLANT
PAD ARMBOARD 7.5X6 YLW CONV (MISCELLANEOUS) ×6 IMPLANT
SUT ETHILON 3 0 PS 1 (SUTURE) IMPLANT
SUT PROLENE 6 0 CC (SUTURE) IMPLANT
SUT SILK 0 TIES 10X30 (SUTURE) ×3 IMPLANT
SUT VIC AB 3-0 SH 27 (SUTURE) ×2
SUT VIC AB 3-0 SH 27X BRD (SUTURE) ×1 IMPLANT
TOWEL GREEN STERILE (TOWEL DISPOSABLE) ×3 IMPLANT
UNDERPAD 30X30 (UNDERPADS AND DIAPERS) ×3 IMPLANT
WATER STERILE IRR 1000ML POUR (IV SOLUTION) ×3 IMPLANT

## 2019-11-10 NOTE — Interval H&P Note (Signed)
History and Physical Interval Note:  11/10/2019 10:00 AM  Thomas [name]  has presented today for surgery, with the diagnosis of failed fistula.  The various methods of treatment have been discussed with the patient and family. After consideration of risks, benefits and other options for treatment, the patient has consented to  Procedure(s): LIGATION OF ARTERIOVENOUS  FISTULA (Left) as a surgical intervention.  The patient's history has been reviewed, patient examined, no change in status, stable for surgery.  I have reviewed the patient's chart and labs.  Questions were answered to the patient's satisfaction.     Curt Jews

## 2019-11-10 NOTE — Transfer of Care (Signed)
Immediate Anesthesia Transfer of Care Note  Patient: Thomas [name]  Procedure(s) Performed: LIGATION OF ARTERIOVENOUS  FISTULA (Left )  Patient Location: PACU  Anesthesia Type:MAC  Level of Consciousness: awake, alert  and oriented  Airway & Oxygen Therapy: Patient Spontanous Breathing  Post-op Assessment: Report given to RN and Post -op Vital signs reviewed and stable  Post vital signs: Reviewed and stable  Last Vitals:  Vitals Value Taken Time  BP 106/82 11/10/19 1123  Temp    Pulse 81 11/10/19 1126  Resp 16 11/10/19 1126  SpO2 90 % 11/10/19 1126  Vitals shown include unvalidated device data.  Last Pain:  Vitals:   11/10/19 1123  TempSrc:   PainSc: (P) 0-No pain      Patients Stated Pain Goal: 5 (38/33/38 3291)  Complications: No apparent anesthesia complications

## 2019-11-10 NOTE — Anesthesia Postprocedure Evaluation (Signed)
Anesthesia Post Note  Patient: Briant Angelillo  Procedure(s) Performed: LIGATION OF ARTERIOVENOUS  FISTULA (Left )     Patient location during evaluation: PACU Anesthesia Type: MAC Level of consciousness: awake and alert Pain management: pain level controlled Vital Signs Assessment: post-procedure vital signs reviewed and stable Respiratory status: spontaneous breathing, nonlabored ventilation and respiratory function stable Cardiovascular status: stable and blood pressure returned to baseline Postop Assessment: no apparent nausea or vomiting Anesthetic complications: no    Last Vitals:  Vitals:   11/10/19 1123 11/10/19 1141  BP: 106/82 102/75  Pulse: 80 86  Resp: 17 15  Temp: 36.9 C 36.7 C  SpO2: 93% 93%    Last Pain:  Vitals:   11/10/19 1141  TempSrc:   PainSc: 0-No pain                 Domingo Fuson,W. EDMOND

## 2019-11-10 NOTE — Op Note (Signed)
    OPERATIVE REPORT  DATE OF SURGERY: 11/10/2019  PATIENT: Thomas [name], 53 y.o. 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  PHYSICIAN ASSISTANT: Nurse  ANESTHESIA: Local with sedation  EBL: per anesthesia record  Total I/O In: 300 [I.V.:300] Out: 5 [Blood:5]  BLOOD ADMINISTERED: none  DRAINS: none  SPECIMEN: none  COUNTS CORRECT:  YES  PATIENT DISPOSITION:  PACU - hemodynamically stable  PROCEDURE DETAILS: Patient was taken operating placed supine position with area of the left wrist prepped draped in usual sterile fashion using local anesthesia incision made over the prior incision over the radiocephalic anastomosis.  The cephalic vein was ligated with 2-0 silk tie.  The wound was irrigated with saline.  Hemostasis left cautery.  The wound was closed with 3-0 Vicryl in the subcutaneous and subcuticular tissue.  Sterile dressing was applied.  The patient had severe steal with tissue loss in both upper extremities.  I did image his groins with the SonoSite ultrasound.  This did show patency of his common femoral profunda and also proximal superficial femoral artery with normal Doppler flow into the superficial femoral artery bilaterally.  We will schedule for elective thigh graft   Rosetta Posner, M.D., [facility] 11/10/2019 11:21 AM

## 2019-11-10 NOTE — Anesthesia Procedure Notes (Signed)
Date/Time: 11/10/2019 10:35 AM Performed by: Trinna Post., CRNA Pre-anesthesia Checklist: Patient identified, Emergency Drugs available, Suction available, Patient being monitored and Timeout performed Patient Re-evaluated:Patient Re-evaluated prior to induction Oxygen Delivery Method: Simple face mask Preoxygenation: Pre-oxygenation with 100% oxygen Induction Type: IV induction Placement Confirmation: positive ETCO2

## 2019-11-11 ENCOUNTER — Encounter (HOSPITAL_COMMUNITY): Payer: Self-pay | Admitting: Vascular Surgery

## 2019-11-12 ENCOUNTER — Other Ambulatory Visit: Payer: Self-pay

## 2019-11-12 ENCOUNTER — Ambulatory Visit (INDEPENDENT_AMBULATORY_CARE_PROVIDER_SITE_OTHER): Payer: Medicare Other | Admitting: *Deleted

## 2019-11-12 DIAGNOSIS — Z5181 Encounter for therapeutic drug level monitoring: Secondary | ICD-10-CM

## 2019-11-12 DIAGNOSIS — I4891 Unspecified atrial fibrillation: Secondary | ICD-10-CM

## 2019-11-12 LAB — POCT INR: INR: 1.4 — AB (ref 2.0–3.0)

## 2019-11-12 NOTE — Patient Instructions (Signed)
Description   Today and tomorrow take 1 tablet then continue taking 1 tablet today then continue taking 1/2 tablet daily. Recheck INR in 1 week after procedure. Call coumadin clinic with any medication changes or upcoming procedures.

## 2019-11-17 ENCOUNTER — Other Ambulatory Visit (HOSPITAL_COMMUNITY)
Admission: RE | Admit: 2019-11-17 | Discharge: 2019-11-17 | Disposition: A | Discharge status: Home / Self Care | Payer: Medicare Other | Source: Ambulatory Visit | Attending: Vascular Surgery | Admitting: Vascular Surgery

## 2019-11-17 DIAGNOSIS — Z01812 Encounter for preprocedural laboratory examination: Secondary | ICD-10-CM | POA: Insufficient documentation

## 2019-11-17 DIAGNOSIS — Z20828 Contact with and (suspected) exposure to other viral communicable diseases: Secondary | ICD-10-CM | POA: Insufficient documentation

## 2019-11-17 LAB — SARS CORONAVIRUS 2 (TAT 6-24 HRS): SARS Coronavirus 2: NEGATIVE

## 2019-11-18 ENCOUNTER — Encounter (HOSPITAL_COMMUNITY): Payer: Self-pay | Admitting: *Deleted

## 2019-11-18 ENCOUNTER — Telehealth: Payer: Self-pay | Admitting: *Deleted

## 2019-11-18 ENCOUNTER — Other Ambulatory Visit: Payer: Self-pay

## 2019-11-18 MED ORDER — DEXTROSE 5 % IV SOLN
3.0000 g | INTRAVENOUS | Status: AC
  Administered 2019-11-19: 3 g via INTRAVENOUS
  Filled 2019-11-18: qty 3

## 2019-11-18 NOTE — Telephone Encounter (Signed)
[facility] PT spouse presented to front desk for samples of rybelsus, spoke to dr kim, per dr kim to boxes of rybelsus given total #60 tablets. Given to spouse by dr Isac Sarna

## 2019-11-18 NOTE — Progress Notes (Signed)
Thomas [name] denies chest pain or shortness of breath.  Thomas [name] was tested for COVID, 11/17/2019,  it was negative.  Thomas [name] has been quarantine at home, except to go to hemodialysis.  Thomas [name] has type II diabetes.  Thomas [name] reports that CBG runs around 195- "It always is that high" I instructed Thomas [name] to take 5 units of Lantus tonight.  No meds for diabetes. Thomas [name] reports that he hasn't taken Rybellsus since 11/16, I instructed Thomas [name] to not take it in am.  I attempted to give Thomas [name] instructions of checking CBG in am and if < 70, Thomas [name] said, "it is now low!"  I started instructing Thomas [name] on what medications to take in am and he said, " I just had surgery 1 week ago, don't you think I know what to take?"  I attempted and Thomas [name] would not let me finish Thomas [name] reports that the last dose for Comadin was 11/17/2019.  I called Ulis Rias at Dr Ainsley Spinner office and informed her, she said it will be ok.

## 2019-11-19 ENCOUNTER — Observation Stay (HOSPITAL_COMMUNITY)
Admission: RE | Admit: 2019-11-19 | Discharge: 2019-11-20 | Disposition: A | Discharge status: Home / Self Care | Payer: Medicare Other | Attending: Vascular Surgery | Admitting: Vascular Surgery

## 2019-11-19 ENCOUNTER — Encounter (HOSPITAL_COMMUNITY)
Admission: RE | Disposition: A | Discharge status: Home / Self Care | Payer: Self-pay | Source: Home / Self Care | Attending: Vascular Surgery

## 2019-11-19 ENCOUNTER — Ambulatory Visit (HOSPITAL_COMMUNITY): Payer: Medicare Other | Admitting: Certified Registered Nurse Anesthetist

## 2019-11-19 ENCOUNTER — Encounter (HOSPITAL_COMMUNITY): Payer: Self-pay

## 2019-11-19 ENCOUNTER — Other Ambulatory Visit: Payer: Self-pay

## 2019-11-19 DIAGNOSIS — Z7982 Long term (current) use of aspirin: Secondary | ICD-10-CM | POA: Insufficient documentation

## 2019-11-19 DIAGNOSIS — M86271 Subacute osteomyelitis, right ankle and foot: Secondary | ICD-10-CM | POA: Insufficient documentation

## 2019-11-19 DIAGNOSIS — E1122 Type 2 diabetes mellitus with diabetic chronic kidney disease: Secondary | ICD-10-CM | POA: Insufficient documentation

## 2019-11-19 DIAGNOSIS — I10 Essential (primary) hypertension: Secondary | ICD-10-CM | POA: Insufficient documentation

## 2019-11-19 DIAGNOSIS — B20 Human immunodeficiency virus [HIV] disease: Secondary | ICD-10-CM | POA: Insufficient documentation

## 2019-11-19 DIAGNOSIS — I132 Hypertensive heart and chronic kidney disease with heart failure and with stage 5 chronic kidney disease, or end stage renal disease: Secondary | ICD-10-CM | POA: Diagnosis not present

## 2019-11-19 DIAGNOSIS — E782 Mixed hyperlipidemia: Secondary | ICD-10-CM | POA: Diagnosis not present

## 2019-11-19 DIAGNOSIS — Z992 Dependence on renal dialysis: Secondary | ICD-10-CM | POA: Insufficient documentation

## 2019-11-19 DIAGNOSIS — N186 End stage renal disease: Secondary | ICD-10-CM | POA: Diagnosis not present

## 2019-11-19 DIAGNOSIS — Z7901 Long term (current) use of anticoagulants: Secondary | ICD-10-CM | POA: Diagnosis not present

## 2019-11-19 DIAGNOSIS — I252 Old myocardial infarction: Secondary | ICD-10-CM | POA: Insufficient documentation

## 2019-11-19 DIAGNOSIS — I5042 Chronic combined systolic (congestive) and diastolic (congestive) heart failure: Secondary | ICD-10-CM | POA: Diagnosis not present

## 2019-11-19 DIAGNOSIS — K219 Gastro-esophageal reflux disease without esophagitis: Secondary | ICD-10-CM | POA: Insufficient documentation

## 2019-11-19 DIAGNOSIS — G4730 Sleep apnea, unspecified: Secondary | ICD-10-CM | POA: Insufficient documentation

## 2019-11-19 DIAGNOSIS — I2510 Atherosclerotic heart disease of native coronary artery without angina pectoris: Secondary | ICD-10-CM | POA: Insufficient documentation

## 2019-11-19 DIAGNOSIS — Z79899 Other long term (current) drug therapy: Secondary | ICD-10-CM | POA: Diagnosis not present

## 2019-11-19 DIAGNOSIS — Z794 Long term (current) use of insulin: Secondary | ICD-10-CM | POA: Diagnosis not present

## 2019-11-19 DIAGNOSIS — I480 Paroxysmal atrial fibrillation: Secondary | ICD-10-CM | POA: Insufficient documentation

## 2019-11-19 DIAGNOSIS — N2581 Secondary hyperparathyroidism of renal origin: Secondary | ICD-10-CM | POA: Insufficient documentation

## 2019-11-19 DIAGNOSIS — N185 Chronic kidney disease, stage 5: Secondary | ICD-10-CM

## 2019-11-19 HISTORY — PX: AV FISTULA PLACEMENT: SHX1204

## 2019-11-19 HISTORY — PX: ARTERIOVENOUS GRAFT PLACEMENT: SUR1029

## 2019-11-19 HISTORY — DX: Cardiac arrhythmia, unspecified: I49.9

## 2019-11-19 LAB — GLUCOSE, CAPILLARY
Glucose-Capillary: 283 mg/dL — ABNORMAL HIGH (ref 70–99)
Glucose-Capillary: 260 mg/dL — ABNORMAL HIGH (ref 70–99)
Glucose-Capillary: 283 mg/dL — ABNORMAL HIGH (ref 70–99)
Glucose-Capillary: 273 mg/dL — ABNORMAL HIGH (ref 70–99)
Glucose-Capillary: 270 mg/dL — ABNORMAL HIGH (ref 70–99)
Glucose-Capillary: 296 mg/dL — ABNORMAL HIGH (ref 70–99)

## 2019-11-19 LAB — POCT I-STAT, CHEM 8
BUN: 33 mg/dL — ABNORMAL HIGH (ref 6–20)
Calcium, Ion: 1.15 mmol/L (ref 1.15–1.40)
Chloride: 97 mmol/L — ABNORMAL LOW (ref 98–111)
Creatinine, Ser: 7.9 mg/dL — ABNORMAL HIGH (ref 0.61–1.24)
Glucose, Bld: 250 mg/dL — ABNORMAL HIGH (ref 70–99)
HCT: 39 % (ref 39.0–52.0)
Hemoglobin: 13.3 g/dL (ref 13.0–17.0)
Potassium: 3.7 mmol/L (ref 3.5–5.1)
Sodium: 136 mmol/L (ref 135–145)
TCO2: 25 mmol/L (ref 22–32)

## 2019-11-19 LAB — PROTIME-INR
INR: 1.7 — ABNORMAL HIGH (ref 0.8–1.2)
Prothrombin Time: 19.7 seconds — ABNORMAL HIGH (ref 11.4–15.2)

## 2019-11-19 SURGERY — INSERTION OF ARTERIOVENOUS (AV) GORE-TEX GRAFT THIGH
Anesthesia: General | Site: Leg Upper | Laterality: Right

## 2019-11-19 MED ORDER — INSULIN ASPART 100 UNIT/ML ~~LOC~~ SOLN
SUBCUTANEOUS | Status: AC
  Administered 2019-11-19: 4 [IU] via SUBCUTANEOUS
  Filled 2019-11-19: qty 1

## 2019-11-19 MED ORDER — SODIUM CHLORIDE 0.9 % IV SOLN: INTRAVENOUS | Status: DC

## 2019-11-19 MED ORDER — LABETALOL HCL 5 MG/ML IV SOLN: 10.0000 mg | INTRAVENOUS | Status: DC | PRN

## 2019-11-19 MED ORDER — FENTANYL CITRATE (PF) 250 MCG/5ML IJ SOLN
INTRAMUSCULAR | Status: AC
  Filled 2019-11-19: qty 5

## 2019-11-19 MED ORDER — PROPOFOL 10 MG/ML IV BOLUS
INTRAVENOUS | Status: DC | PRN
  Administered 2019-11-19: 50 mg via INTRAVENOUS
  Administered 2019-11-19: 150 mg via INTRAVENOUS

## 2019-11-19 MED ORDER — PHENOL 1.4 % MT LIQD: 1.0000 | OROMUCOSAL | Status: DC | PRN

## 2019-11-19 MED ORDER — POTASSIUM CHLORIDE CRYS ER 20 MEQ PO TBCR: 20.0000 meq | EXTENDED_RELEASE_TABLET | Freq: Every day | ORAL | Status: DC | PRN

## 2019-11-19 MED ORDER — HYDRALAZINE HCL 20 MG/ML IJ SOLN: 5.0000 mg | INTRAMUSCULAR | Status: DC | PRN

## 2019-11-19 MED ORDER — CINACALCET HCL 30 MG PO TABS
90.0000 mg | ORAL_TABLET | Freq: Every day | ORAL | Status: DC
  Administered 2019-11-19: 90 mg via ORAL
  Filled 2019-11-19: qty 3

## 2019-11-19 MED ORDER — MIDAZOLAM HCL 2 MG/2ML IJ SOLN
INTRAMUSCULAR | Status: AC
  Filled 2019-11-19: qty 2

## 2019-11-19 MED ORDER — PHENYLEPHRINE 40 MCG/ML (10ML) SYRINGE FOR IV PUSH (FOR BLOOD PRESSURE SUPPORT)
PREFILLED_SYRINGE | INTRAVENOUS | Status: AC
  Filled 2019-11-19: qty 10

## 2019-11-19 MED ORDER — GUAIFENESIN-DM 100-10 MG/5ML PO SYRP: 15.0000 mL | ORAL_SOLUTION | ORAL | Status: DC | PRN

## 2019-11-19 MED ORDER — INSULIN ASPART 100 UNIT/ML ~~LOC~~ SOLN
4.0000 [IU] | Freq: Once | SUBCUTANEOUS | Status: AC
  Administered 2019-11-19: 07:00:00 4 [IU] via SUBCUTANEOUS

## 2019-11-19 MED ORDER — ONDANSETRON HCL 4 MG/2ML IJ SOLN
INTRAMUSCULAR | Status: DC | PRN
  Administered 2019-11-19: 4 mg via INTRAVENOUS

## 2019-11-19 MED ORDER — 0.9 % SODIUM CHLORIDE (POUR BTL) OPTIME
TOPICAL | Status: DC | PRN
  Administered 2019-11-19: 1000 mL

## 2019-11-19 MED ORDER — PROTAMINE SULFATE 10 MG/ML IV SOLN
INTRAVENOUS | Status: AC
  Filled 2019-11-19: qty 5

## 2019-11-19 MED ORDER — AMIODARONE HCL 200 MG PO TABS
200.0000 mg | ORAL_TABLET | Freq: Every day | ORAL | Status: DC
  Administered 2019-11-19: 200 mg via ORAL
  Filled 2019-11-19: qty 1

## 2019-11-19 MED ORDER — SODIUM CHLORIDE 0.9 % IV SOLN
INTRAVENOUS | Status: AC
  Filled 2019-11-19: qty 1.2

## 2019-11-19 MED ORDER — OXYCODONE HCL 5 MG PO TABS: 5.0000 mg | ORAL_TABLET | ORAL | Status: DC | PRN

## 2019-11-19 MED ORDER — MIDAZOLAM HCL 2 MG/2ML IJ SOLN
INTRAMUSCULAR | Status: DC | PRN
  Administered 2019-11-19: 1 mg via INTRAVENOUS

## 2019-11-19 MED ORDER — PROPOFOL 10 MG/ML IV BOLUS
INTRAVENOUS | Status: AC
  Filled 2019-11-19: qty 20

## 2019-11-19 MED ORDER — INSULIN GLARGINE 100 UNIT/ML ~~LOC~~ SOLN
10.0000 [IU] | Freq: Every day | SUBCUTANEOUS | Status: DC
  Administered 2019-11-19: 10 [IU] via SUBCUTANEOUS
  Filled 2019-11-19 (×2): qty 0.1

## 2019-11-19 MED ORDER — METOPROLOL TARTRATE 5 MG/5ML IV SOLN: 2.0000 mg | INTRAVENOUS | Status: DC | PRN

## 2019-11-19 MED ORDER — ALBUMIN HUMAN 5 % IV SOLN
INTRAVENOUS | Status: DC | PRN
  Administered 2019-11-19: 09:00:00 via INTRAVENOUS

## 2019-11-19 MED ORDER — ACETAMINOPHEN 650 MG RE SUPP: 325.0000 mg | RECTAL | Status: DC | PRN

## 2019-11-19 MED ORDER — OXYCODONE HCL 5 MG PO TABS: 5.0000 mg | ORAL_TABLET | Freq: Once | ORAL | Status: DC | PRN

## 2019-11-19 MED ORDER — INSULIN ASPART 100 UNIT/ML ~~LOC~~ SOLN
SUBCUTANEOUS | Status: AC
  Administered 2019-11-19: 12:00:00 6 [IU] via SUBCUTANEOUS
  Filled 2019-11-19: qty 1

## 2019-11-19 MED ORDER — ROSUVASTATIN CALCIUM 20 MG PO TABS
20.0000 mg | ORAL_TABLET | Freq: Every day | ORAL | Status: DC
  Administered 2019-11-19: 20 mg via ORAL
  Filled 2019-11-19: qty 1

## 2019-11-19 MED ORDER — LIDOCAINE 2% (20 MG/ML) 5 ML SYRINGE
INTRAMUSCULAR | Status: DC | PRN
  Administered 2019-11-19: 100 mg via INTRAVENOUS

## 2019-11-19 MED ORDER — SODIUM CHLORIDE 0.9 % IV SOLN: 500.0000 mL | Freq: Once | INTRAVENOUS | Status: DC | PRN

## 2019-11-19 MED ORDER — OXYCODONE HCL 5 MG/5ML PO SOLN: 5.0000 mg | Freq: Once | ORAL | Status: DC | PRN

## 2019-11-19 MED ORDER — SODIUM CHLORIDE 0.9 % IV SOLN
INTRAVENOUS | Status: DC | PRN
  Administered 2019-11-19 (×2): via INTRAVENOUS

## 2019-11-19 MED ORDER — MAGNESIUM SULFATE 2 GM/50ML IV SOLN
2.0000 g | Freq: Every day | INTRAVENOUS | Status: DC | PRN
  Filled 2019-11-19: qty 50

## 2019-11-19 MED ORDER — HEPARIN SODIUM (PORCINE) 1000 UNIT/ML IJ SOLN
INTRAMUSCULAR | Status: DC | PRN
  Administered 2019-11-19: 10000 [IU] via INTRAVENOUS

## 2019-11-19 MED ORDER — ACETAMINOPHEN 325 MG PO TABS: 325.0000 mg | ORAL_TABLET | ORAL | Status: DC | PRN

## 2019-11-19 MED ORDER — DOCUSATE SODIUM 100 MG PO CAPS: 100.0000 mg | ORAL_CAPSULE | Freq: Every day | ORAL | Status: DC

## 2019-11-19 MED ORDER — DEXAMETHASONE SODIUM PHOSPHATE 10 MG/ML IJ SOLN
INTRAMUSCULAR | Status: AC
  Filled 2019-11-19: qty 1

## 2019-11-19 MED ORDER — GABAPENTIN 100 MG PO CAPS
200.0000 mg | ORAL_CAPSULE | Freq: Every day | ORAL | Status: DC
  Administered 2019-11-19: 200 mg via ORAL
  Filled 2019-11-19: qty 2

## 2019-11-19 MED ORDER — FENTANYL CITRATE (PF) 250 MCG/5ML IJ SOLN
INTRAMUSCULAR | Status: DC | PRN
  Administered 2019-11-19 (×4): 25 ug via INTRAVENOUS

## 2019-11-19 MED ORDER — METOPROLOL SUCCINATE ER 100 MG PO TB24
100.0000 mg | ORAL_TABLET | Freq: Every day | ORAL | Status: DC
  Filled 2019-11-19: qty 1

## 2019-11-19 MED ORDER — ASPIRIN 81 MG PO CHEW
81.0000 mg | CHEWABLE_TABLET | Freq: Every day | ORAL | Status: DC
  Administered 2019-11-19: 81 mg via ORAL
  Filled 2019-11-19: qty 1

## 2019-11-19 MED ORDER — LIDOCAINE HCL (PF) 1 % IJ SOLN
INTRAMUSCULAR | Status: AC
  Filled 2019-11-19: qty 30

## 2019-11-19 MED ORDER — LIDOCAINE 2% (20 MG/ML) 5 ML SYRINGE
INTRAMUSCULAR | Status: AC
  Filled 2019-11-19: qty 5

## 2019-11-19 MED ORDER — INSULIN ASPART 100 UNIT/ML ~~LOC~~ SOLN
6.0000 [IU] | Freq: Once | SUBCUTANEOUS | Status: AC
  Administered 2019-11-19: 12:00:00 6 [IU] via SUBCUTANEOUS

## 2019-11-19 MED ORDER — HEPARIN SODIUM (PORCINE) 1000 UNIT/ML IJ SOLN
INTRAMUSCULAR | Status: AC
  Filled 2019-11-19: qty 1

## 2019-11-19 MED ORDER — DEXAMETHASONE SODIUM PHOSPHATE 10 MG/ML IJ SOLN
INTRAMUSCULAR | Status: DC | PRN
  Administered 2019-11-19: 5 mg via INTRAVENOUS

## 2019-11-19 MED ORDER — ALUM & MAG HYDROXIDE-SIMETH 200-200-20 MG/5ML PO SUSP: 15.0000 mL | ORAL | Status: DC | PRN

## 2019-11-19 MED ORDER — PROTAMINE SULFATE 10 MG/ML IV SOLN
INTRAVENOUS | Status: DC | PRN
  Administered 2019-11-19 (×2): 10 mg via INTRAVENOUS
  Administered 2019-11-19: 20 mg via INTRAVENOUS

## 2019-11-19 MED ORDER — PHENYLEPHRINE HCL-NACL 10-0.9 MG/250ML-% IV SOLN
INTRAVENOUS | Status: DC | PRN
  Administered 2019-11-19: 10:00:00 via INTRAVENOUS
  Administered 2019-11-19: 30 ug/min via INTRAVENOUS

## 2019-11-19 MED ORDER — ONDANSETRON HCL 4 MG/2ML IJ SOLN
INTRAMUSCULAR | Status: AC
  Filled 2019-11-19: qty 2

## 2019-11-19 MED ORDER — SODIUM CHLORIDE 0.9 % IV SOLN
INTRAVENOUS | Status: DC | PRN
  Administered 2019-11-19: 500 mL

## 2019-11-19 MED ORDER — ONDANSETRON HCL 4 MG/2ML IJ SOLN: 4.0000 mg | Freq: Once | INTRAMUSCULAR | Status: DC | PRN

## 2019-11-19 MED ORDER — FENTANYL CITRATE (PF) 100 MCG/2ML IJ SOLN
INTRAMUSCULAR | Status: AC
  Administered 2019-11-19: 50 ug via INTRAVENOUS
  Filled 2019-11-19: qty 2

## 2019-11-19 MED ORDER — BICTEGRAVIR-EMTRICITAB-TENOFOV 50-200-25 MG PO TABS
1.0000 | ORAL_TABLET | Freq: Every day | ORAL | Status: DC
  Administered 2019-11-19: 1 via ORAL
  Filled 2019-11-19 (×2): qty 1

## 2019-11-19 MED ORDER — PHENYLEPHRINE 40 MCG/ML (10ML) SYRINGE FOR IV PUSH (FOR BLOOD PRESSURE SUPPORT)
PREFILLED_SYRINGE | INTRAVENOUS | Status: DC | PRN
  Administered 2019-11-19: 80 ug via INTRAVENOUS
  Administered 2019-11-19: 120 ug via INTRAVENOUS
  Administered 2019-11-19: 80 ug via INTRAVENOUS
  Administered 2019-11-19: 120 ug via INTRAVENOUS

## 2019-11-19 MED ORDER — INSULIN ASPART 100 UNIT/ML ~~LOC~~ SOLN
0.0000 [IU] | Freq: Three times a day (TID) | SUBCUTANEOUS | Status: DC
  Administered 2019-11-19 – 2019-11-20 (×2): 11 [IU] via SUBCUTANEOUS

## 2019-11-19 MED ORDER — CEFAZOLIN SODIUM-DEXTROSE 2-4 GM/100ML-% IV SOLN
2.0000 g | Freq: Three times a day (TID) | INTRAVENOUS | Status: AC
  Administered 2019-11-19 (×2): 2 g via INTRAVENOUS
  Filled 2019-11-19 (×2): qty 100

## 2019-11-19 MED ORDER — FENTANYL CITRATE (PF) 100 MCG/2ML IJ SOLN
25.0000 ug | INTRAMUSCULAR | Status: DC | PRN
  Administered 2019-11-19 (×2): 50 ug via INTRAVENOUS

## 2019-11-19 SURGICAL SUPPLY — 38 items
CANISTER SUCT 3000ML PPV (MISCELLANEOUS) ×3 IMPLANT
CLIP VESOCCLUDE MED 24/CT (CLIP) ×2 IMPLANT
CLIP VESOCCLUDE SM WIDE 24/CT (CLIP) ×2 IMPLANT
COVER WAND RF STERILE (DRAPES) ×1 IMPLANT
DERMABOND ADVANCED (GAUZE/BANDAGES/DRESSINGS) ×2
DERMABOND ADVANCED .7 DNX12 (GAUZE/BANDAGES/DRESSINGS) ×1 IMPLANT
DRAPE INCISE IOBAN 66X45 STRL (DRAPES) ×2 IMPLANT
DRAPE ORTHO SPLIT 77X108 STRL (DRAPES) ×2
DRAPE SURG ORHT 6 SPLT 77X108 (DRAPES) IMPLANT
ELECT REM PT RETURN 9FT ADLT (ELECTROSURGICAL) ×3
ELECTRODE REM PT RTRN 9FT ADLT (ELECTROSURGICAL) ×1 IMPLANT
GLOVE BIO SURGEON STRL SZ7.5 (GLOVE) ×3 IMPLANT
GLOVE BIOGEL PI IND STRL 8 (GLOVE) ×1 IMPLANT
GLOVE BIOGEL PI INDICATOR 8 (GLOVE) ×2
GLOVE SURG SS PI 6.5 STRL IVOR (GLOVE) ×2 IMPLANT
GOWN STRL REUS W/ TWL LRG LVL3 (GOWN DISPOSABLE) ×2 IMPLANT
GOWN STRL REUS W/ TWL XL LVL3 (GOWN DISPOSABLE) ×2 IMPLANT
GOWN STRL REUS W/TWL LRG LVL3 (GOWN DISPOSABLE) ×6
GOWN STRL REUS W/TWL XL LVL3 (GOWN DISPOSABLE) ×4
GRAFT GORETEX STRT 4-7X45 (Vascular Products) ×2 IMPLANT
HEMOSTAT SPONGE AVITENE ULTRA (HEMOSTASIS) IMPLANT
KIT BASIN OR (CUSTOM PROCEDURE TRAY) ×3 IMPLANT
KIT TURNOVER KIT B (KITS) ×3 IMPLANT
LOOP VESSEL MAXI BLUE (MISCELLANEOUS) ×2 IMPLANT
NS IRRIG 1000ML POUR BTL (IV SOLUTION) ×3 IMPLANT
PACK CV ACCESS (CUSTOM PROCEDURE TRAY) ×3 IMPLANT
PAD ARMBOARD 7.5X6 YLW CONV (MISCELLANEOUS) ×6 IMPLANT
SPONGE LAP 18X18 RF (DISPOSABLE) ×2 IMPLANT
SUT GORETEX 5 0 TT13 24 (SUTURE) ×2 IMPLANT
SUT MNCRL AB 4-0 PS2 18 (SUTURE) ×5 IMPLANT
SUT PROLENE 6 0 BV (SUTURE) ×10 IMPLANT
SUT SILK 2 0 PERMA HAND 18 BK (SUTURE) ×2 IMPLANT
SUT VIC AB 2-0 CT1 27 (SUTURE) ×2
SUT VIC AB 2-0 CT1 TAPERPNT 27 (SUTURE) ×1 IMPLANT
SUT VIC AB 3-0 SH 27 (SUTURE) ×6
SUT VIC AB 3-0 SH 27X BRD (SUTURE) ×2 IMPLANT
TOWEL GREEN STERILE (TOWEL DISPOSABLE) ×3 IMPLANT
WATER STERILE IRR 1000ML POUR (IV SOLUTION) ×3 IMPLANT

## 2019-11-19 NOTE — Progress Notes (Signed)
After multiple doses of pain medication patient has become more compliant with bedrest and has acknowledge understanding to maitain right leg as straight as possible.

## 2019-11-19 NOTE — Transfer of Care (Signed)
Immediate Anesthesia Transfer of Care Note  Patient: Thomas [name]  Procedure(s) Performed: INSERTION OF ARTERIOVENOUS (AV) GORE-TEX GRAFT RIGHT FEMORAL (Right Leg Upper)  Patient Location: PACU  Anesthesia Type:General  Level of Consciousness: awake and alert   Airway & Oxygen Therapy: Patient Spontanous Breathing and Patient connected to nasal cannula oxygen  Post-op Assessment: Report given to RN, Post -op Vital signs reviewed and stable and Patient moving all extremities X 4  Post vital signs: Reviewed and stable  Last Vitals:  Vitals Value Taken Time  BP    Temp    Pulse 72 11/19/19 1047  Resp 12 11/19/19 1047  SpO2 97 % 11/19/19 1047  Vitals shown include unvalidated device data.  Last Pain:  Vitals:   11/19/19 0643  TempSrc:   PainSc: 0-No pain         Complications: No apparent anesthesia complications

## 2019-11-19 NOTE — Progress Notes (Signed)
NEW ADMISSION NOTE New Admission Note:   Arrival Method: stretcher from PACU Mental Orientation: alert and oriented x4  Telemetry: NA Assessment: Completed Skin: intact and bilat BKA IV: LH Pain: 0 Safety Measures: Safety Fall Prevention Plan has been given, discussed and signed Admission: Completed [facility] Orientation: Patient has been orientated to the room, unit and staff.  Family: NA  Orders have been reviewed and implemented. Will continue to monitor the patient. Call light has been placed within reach and bed alarm has been activated.   Baldo Ash, RN

## 2019-11-19 NOTE — Discharge Instructions (Signed)
[facility]  Discharge Instructions  AV Fistula or Graft Surgery for Dialysis Access  Please refer to the following instructions for your post-procedure care. Your surgeon or physician assistant will discuss any changes with you.  Activity  You may drive the day following your surgery, if you are comfortable and no longer taking prescription pain medication. Resume full activity as the soreness in your incision resolves.  Bathing/Showering  You may shower after you go home. Keep your incision dry for 48 hours. Do not soak in a bathtub, hot tub, or swim until the incision heals completely. You may not shower if you have a hemodialysis catheter.  Incision Care  Clean your incision with mild soap and water after 48 hours. Pat the area dry with a clean towel. You do not need a bandage unless otherwise instructed. Do not apply any ointments or creams to your incision. You may have skin glue on your incision. Do not peel it off. It will come off on its own in about one week. Your arm may swell a bit after surgery. To reduce swelling use pillows to elevate your arm so it is above your heart. Your doctor will tell you if you need to lightly wrap your arm with an ACE bandage.  Wash the groin wound with soap and water daily and pat dry. (No tub bath-only shower)  Then put a dry gauze or washcloth there to keep this area dry daily and as needed.  Do not use Vaseline or neosporin on your incisions.  Only use soap and water on your incisions and then protect and keep dry.   Diet  Resume your normal diet. There are not special food restrictions following this procedure. In order to heal from your surgery, it is CRITICAL to get adequate nutrition. Your body requires vitamins, minerals, and protein. Vegetables are the best source of vitamins and minerals. Vegetables also provide the perfect balance of protein. Processed food has little nutritional value, so try to avoid  this.  Medications  Resume taking all of your medications. If your incision is causing pain, you may take over-the counter pain relievers such as acetaminophen (Tylenol). If you were prescribed a stronger pain medication, please be aware these medications can cause nausea and constipation. Prevent nausea by taking the medication with a snack or meal. Avoid constipation by drinking plenty of fluids and eating foods with high amount of fiber, such as fruits, vegetables, and grains.  Do not take Tylenol if you are taking prescription pain medications.  Follow up Your surgeon may want to see you in the office following your access surgery. If so, this will be arranged at the time of your surgery.  Please call us immediately for any of the following conditions:  Increased pain, redness, drainage (pus) from your incision site Fever of 101 degrees or higher Severe or worsening pain at your incision site Hand pain or numbness.  Reduce your risk of vascular disease:  Stop smoking. If you would like help, call [facility] at 1-800-QUIT-NOW 254 361 0987) or [place] at [place] your cholesterol Maintain a desired weight Control your diabetes Keep your blood pressure down  Dialysis  It will take several weeks to several months for your new dialysis access to be ready for use. Your surgeon will determine when it is okay to use it. Your nephrologist will continue to direct your dialysis. You can continue to use your Permcath until your new access is ready for use.  11/19/2019 Thomas [name] [id number] [birth year]  Surgeon(s): [name], MD  Procedure(s): INSERTION OF ARTERIOVENOUS (AV) GORE-TEX GRAFT RIGHT FEMORAL  x Do not stick graft for 4 weeks    If you have any questions, please call the office at [phone].

## 2019-11-19 NOTE — Anesthesia Preprocedure Evaluation (Addendum)
Anesthesia Evaluation  Patient identified by MRN, date of birth, ID band Patient awake    Reviewed: Allergy & Precautions, H&P , NPO status , Patient's Chart, lab work & pertinent test results, reviewed documented beta blocker date and time   Airway Mallampati: III  TM Distance: >3 FB Neck ROM: Full    Dental  (+) Teeth Intact, Dental Advisory Given   Pulmonary sleep apnea ,    Pulmonary exam normal        Cardiovascular hypertension, Pt. on medications and Pt. on home beta blockers + angina at rest + CAD, + Past MI, + Peripheral Vascular Disease and +CHF (EF 35-40)  Normal cardiovascular exam     Neuro/Psych  Headaches, negative psych ROS   GI/Hepatic Neg liver ROS, GERD  Medicated and Controlled,  Endo/Other  diabetes, Insulin Dependent  Renal/GU ESRF and DialysisRenal disease  negative genitourinary   Musculoskeletal   Abdominal   Peds  Hematology  (+) Blood dyscrasia, anemia ,   Anesthesia Other Findings   Reproductive/Obstetrics negative OB ROS                           Anesthesia Physical  Anesthesia Plan  ASA: IV  Anesthesia Plan: General   Post-op Pain Management:    Induction: Intravenous  PONV Risk Score and Plan: 3 and Ondansetron, Midazolam, Dexamethasone and Treatment may vary due to age or medical condition  Airway Management Planned: LMA  Additional Equipment: None  Intra-op Plan:   Post-operative Plan: Extubation in OR  Informed Consent: I have reviewed the patients History and Physical, chart, labs and discussed the procedure including the risks, benefits and alternatives for the proposed anesthesia with the patient or authorized representative who has indicated his/her understanding and acceptance.     Dental advisory given  Plan Discussed with:   Anesthesia Plan Comments: (Recent admission 10/17-10/19 for unstable angina. Per discharge summary "Pt  presented for chest pain during his dialysis session on Saturday 10/17. He described it as chest pressure that resolved over time and with sublingual nitroglycerin. EKG was remarkable for nonspecific ST segment changes. Last cardiac catheterization in Jan 2020 showed severe 2 vessel obstructive CAD. Only one lesion in the mid LAD is amenable to PCI, which would be complex due to severe calcification requiring atherectomy, stenting, and dual antiplatelet therapy. Troponin peaked at 686 on 10/18. Cardiology evaluated the patient and recommended continued medical management with aspirin, IV heparin, metoprolol, and rosuvastatin. Repeat cardiac catheterization was not pursued due to resolution of pt's chest pain and prior cath showing limited revascularization options. IV heparin was stopped in the afternoon of 10/19 and pt was given a 5mg  dose of oral warfarin prior to discharge. He was instructed to follow-up in clinic for hospital follow-up and INR recheck this week."  EKG 10/17/19: Sinus rhythm with 1st degree A-V block. Rate 70. ST & T wave abnormality, consider lateral ischemia  Cardiac catheterization 01/25/2019:  Prox LAD lesion is 30% stenosed.  Ost 3rd Diag lesion is 95% stenosed.  Mid LAD lesion is 80% stenosed.  Dist LAD lesion is 99% stenosed.  Ost 1st Diag lesion is 100% stenosed.  Acute Mrg lesion is 95% stenosed.  1st RPLB lesion is 100% stenosed.  Post Atrio lesion is 95% stenosed.  LV end diastolic pressure is mildly elevated. 1. Severe 2 vessel obstructive CAD - 80% mid LAD - 99% distal LAD and third diagonal - 100% first diagonal - 100% first PLOM,  95% ongoing PL branch - 90% RV marginal branch. 2. Mildly elevated LVEDP 24 mm Hg. Plan: Compared to June 2019 there is no significant change. The only lesion that is amenable to PCI is the mid LAD. This would be a complex PCI with need for atherectomy and stenting due to severe calcification. It would also  require DAPT as well as coumadin increasing bleeding risk. As noted in prior cath note I don't think treatment of this lesion would add to his overall prognosis. The patient has only had 2 episodes of angina since last June. One occurred last October when he became hypotensive during dialysis. His recent symptoms are clearly related to development of atrial flutter and his symptoms resolved with restoration of NSR. This would also argue for continued medical management.  Echocardiogram 01/24/2019: - Left ventricle: The cavity size was mildly dilated. Wall thickness was increased in a pattern of severe LVH. Systolic function was moderately reduced. The estimated ejection fraction was in the range of 35% to 40%. Diffuse hypokinesis. Doppler parameters are consistent with restrictive physiology, indicative of decreased left ventricular diastolic compliance and/or increased left atrial pressure. Doppler parameters are consistent with high ventricular filling pressure. - Aortic valve: There was mild regurgitation. - Aortic root: The aortic root was mildly dilated. - Ascending aorta: The ascending aorta was mildly dilated. - Mitral valve: Calcified annulus. There was mild regurgitation. - Left atrium: The atrium was severely dilated. - Pulmonary arteries: Systolic pressure was mildly increased. PA peak pressure: 42 mm Hg (S).  Impressions: - Moderate global reduction in LV systolic function (EF 40); severe LVH; restrictive filling; mild LVE; mild AI; mildly dilated aortic root/ascending aorta; mild MR; severe LAE; mild TR with mild pulmonary hypertension.  )       Anesthesia Quick Evaluation

## 2019-11-19 NOTE — Anesthesia Procedure Notes (Signed)
Procedure Name: LMA Insertion Date/Time: 11/19/2019 7:54 AM Performed by: Harden Mo, CRNA Pre-anesthesia Checklist: Patient identified, Emergency Drugs available, Suction available and Patient being monitored Patient Re-evaluated:Patient Re-evaluated prior to induction Oxygen Delivery Method: Circle System Utilized Preoxygenation: Pre-oxygenation with 100% oxygen Induction Type: IV induction LMA: LMA inserted LMA Size: 5.0 Number of attempts: 1 Airway Equipment and Method: Bite block Placement Confirmation: positive ETCO2 Tube secured with: Tape Dental Injury: Teeth and Oropharynx as per pre-operative assessment

## 2019-11-19 NOTE — Progress Notes (Signed)
Received patient from OR restless and not following commands. Encouraged patient  multiple times to lay back in bed for preservation of right thigh graft. Patient non compliant. Dr Christella Hartigan and Dr Carlis Abbott aware of status. No further orders received at this time.

## 2019-11-19 NOTE — Progress Notes (Signed)
Spoke with Dr. Christella Hartigan regarding BG 283.  Order received for Novalog insulin 4 units.  Will continue to monitor.

## 2019-11-19 NOTE — Anesthesia Postprocedure Evaluation (Signed)
Anesthesia Post Note  Patient: Thomas [name]  Procedure(s) Performed: INSERTION OF ARTERIOVENOUS (AV) GORE-TEX GRAFT RIGHT FEMORAL (Right Leg Upper)     Patient location during evaluation: PACU Anesthesia Type: General Level of consciousness: awake and alert Pain management: pain level controlled Vital Signs Assessment: post-procedure vital signs reviewed and stable Respiratory status: spontaneous breathing, nonlabored ventilation and respiratory function stable Cardiovascular status: blood pressure returned to baseline and stable Postop Assessment: no apparent nausea or vomiting Anesthetic complications: no    Last Vitals:  Vitals:   11/19/19 1315 11/19/19 1330  BP: (!) 85/58 91/67  Pulse: 65 68  Resp: 12 14  Temp:    SpO2: 98% 96%    Last Pain:  Vitals:   11/19/19 1315  TempSrc:   PainSc: Asleep                 Lidia Collum

## 2019-11-19 NOTE — H&P (Signed)
History and Physical Interval Note:  11/19/2019 7:35 AM  Thomas [name]  has presented today for surgery, with the diagnosis of end stage renal disease.  The various methods of treatment have been discussed with the patient and family. After consideration of risks, benefits and other options for treatment, the patient has consented to  Procedure(s): INSERTION OF ARTERIOVENOUS (AV) GORE-TEX GRAFT RIGHT FEMORAL (Right) as a surgical intervention.  The patient's history has been reviewed, patient examined, no change in status, stable for surgery.  I have reviewed the patient's chart and labs.  Questions were answered to the patient's satisfaction.     Right femoral loop graft  Thomas [name]  CC: worsening ulcer left 3rd finger tip, pain an numbness left hand, left arm AVF  History of Present Illness  Thomas [name] is a 53 y.o. (Jan 03, 1966) male who is s/p creation of left radiocephalic AV fistulaand ligation of right radiocephalic AV fistula(steal symptoms in the right armandnonhealing wound of right thumb followed byhand surgery) by Dr. Scot Dock, and placement of tunneled dialysis catheter dictated separately by Dr. Samuel Bouche 06-25-19.  Pt was last evaluated on 08-09-19 by M. Eveland PA-C. At that timeleft radiocephalic fistulawas patentwithout signs or symptoms of steal syndrome.  At that time pt wasdialyzing via new tunneled dialysis catheter on a Tuesday Thursday Saturday schedule. He deniedany signs or symptoms of steal syndrome in his left hand. Incision left wristwas well-healed. Plan was to recheck fistula duplex in 1 month;patient may require sidebranch ligation and/or fistulogram prior to use of fistula.Pt was to continue HD [facility] for the time being.  He retuns today with c/o 2 months history of worsening ulcer at left 3rd finger tip. His entire left hand feels numb and painful, per pt.   He is still dialyzing via right IJ TDC on TTS.   His right forearm  access was his first access, this was ligated due to right thumb ulcer which has healed.       Past Medical History:  Diagnosis Date  . Anemia   . Ascending aortic aneurysm ([place])   . CAD (coronary artery disease), native coronary artery    Severe mulitvessel disease  . Chronic combined systolic and diastolic CHF (congestive heart failure) ([place])   . Diabetic wet gangrene of the foot ([place]) 09/04/2017  . ESRD (end stage renal disease) on dialysis [facility])    [place] [address] [place], [place], [place] (10/15/2018)  . Essential hypertension   . Headache   . HIV disease ([place])   . Hyperparathyroidism, secondary ([place])   . Hypertensive heart disease with end stage renal disease on dialysis ([place])   . Mallory-Weiss tear 2018  . Mixed hyperlipidemia   . PAF (paroxysmal atrial fibrillation) ([place])   . Paroxysmal atrial flutter ([place])   . Pneumonia X 1  . QT prolongation   . Subacute osteomyelitis, right ankle and foot ([place])   . SVT (supraventricular tachycardia) ([place])    ? afib or atrial flutter s/p TEE/DCCV with subsequent ablation due to reoccurrence in [place]  . Type 2 diabetes mellitus ([place])   . Wears glasses     Social History Social History        Tobacco Use  . Smoking status: Never Smoker  . Smokeless tobacco: Never Used  Substance Use Topics  . Alcohol use: Never    Frequency: Never  . Drug use: Never    Family History Family History  Problem Relation Age of Onset  . Heart disease Mother   .  Diabetes Mother   . Heart disease Father   . Cancer Neg Hx     Surgical History      Past Surgical History:  Procedure Laterality Date  . AMPUTATION Left    foot  . AMPUTATION Right 12/06/2017   Procedure: AMPUTATION BELOW KNEE;  Surgeon: [name], MD;  Location: [place];  Service: Orthopedics;  Laterality: Right;  . APPLICATION OF WOUND VAC Right 09/04/2017   Procedure: APPLICATION OF WOUND VAC;  Surgeon: [name], DPM;  Location: [place];   Service: Podiatry;  Laterality: Right;  . APPLICATION OF WOUND VAC  12/06/2017   Procedure: APPLICATION OF WOUND VAC;  Surgeon: [name], MD;  Location: [place];  Service: Orthopedics;;  . AV FISTULA PLACEMENT Right 10/19/2012  . AV FISTULA PLACEMENT Left 06/25/2019   Procedure: Arteriovenous (Av) Fistula Creation left arm.;  Surgeon: Angelia Mould, MD;  Location: [place];  Service: Vascular;  Laterality: Left;  . AV FISTULA REPAIR Right ~ 02/2018   "had it cleaned out"  . BELOW KNEE LEG AMPUTATION Left ~ 2016  . COLONOSCOPY WITH PROPOFOL N/A 07/30/2017   Procedure: COLONOSCOPY WITH PROPOFOL;  Surgeon: [name], MD;  Location: [place];  Service: Endoscopy;  Laterality: N/A;  . ESOPHAGOGASTRODUODENOSCOPY (EGD) WITH PROPOFOL N/A 07/28/2017   Procedure: ESOPHAGOGASTRODUODENOSCOPY (EGD) WITH PROPOFOL;  Surgeon: [name], MD;  Location: [place];  Service: Endoscopy;  Laterality: N/A;  . ESOPHAGOGASTRODUODENOSCOPY (EGD) WITH PROPOFOL N/A 01/15/2018   Procedure: ESOPHAGOGASTRODUODENOSCOPY (EGD) WITH PROPOFOL;  Surgeon: [name], MD;  Location: [facility] ENDOSCOPY;  Service: Endoscopy;  Laterality: N/A;  . FLEXIBLE SIGMOIDOSCOPY N/A 07/27/2017   Procedure: FLEXIBLE SIGMOIDOSCOPY;  Surgeon: [name], MD;  Location: [facility] ENDOSCOPY;  Service: Endoscopy;  Laterality: N/A;  . GRAFT APPLICATION Right [id number]   Procedure: SKIN GRAFT APPLICATION RIGHT FOOT;  Surgeon: [name], DPM;  Location: [place];  Service: Podiatry;  Laterality: Right;  . I&D EXTREMITY Right 09/04/2017   Procedure: IRRIGATION AND DEBRIDEMENT EXTREMITY;  Surgeon: [name], DPM;  Location: [place];  Service: Podiatry;  Laterality: Right;  . I&D EXTREMITY Right 11/12/2017   Procedure: IRRIGATION AND DEBRIDEMENT ULCER RIGHT FOOT;  Surgeon: [name], DPM;  Location: [place];  Service: Podiatry;  Laterality: Right;  . INSERTION OF DIALYSIS CATHETER Right 06/25/2019   Procedure:  INSERTION OF TUNNELED  DIALYSIS CATHETER Right Internal Jugular.;  Surgeon: Serafina [name], MD;  Location: [place];  Service: Vascular;  Laterality: Right;  . LEFT HEART CATH AND CORONARY ANGIOGRAPHY N/A 06/18/2018   Procedure: LEFT HEART CATH AND CORONARY ANGIOGRAPHY;  Surgeon: [name], Peter M, MD;  Location: [place] CV [facility];  Service: Cardiovascular;  Laterality: N/A;  . LEFT HEART CATH AND CORONARY ANGIOGRAPHY N/A 01/25/2019   Procedure: LEFT HEART CATH AND CORONARY ANGIOGRAPHY;  Surgeon: [name], Peter M, MD;  Location: [place] CV [facility];  Service: Cardiovascular;  Laterality: N/A;  . LIGATION OF ARTERIOVENOUS  FISTULA Right 06/25/2019   Procedure: Ligation Of Arteriovenous  Fistula right arm.;  Surgeon: Angelia Mould, MD;  Location: [facility];  Service: Vascular;  Laterality: Right;  . WOUND DEBRIDEMENT N/A 09/24/2017   Procedure: DEBRIDEMENT WOUND;  Surgeon: [name], DPM;  Location: [facility];  Service: Podiatry;  Laterality: N/A;    Allergies  Allergen Reactions  . Oxycodone-Acetaminophen Other (See Comments)    Hallucinations/delirum          Current Outpatient Medications  Medication Sig Dispense  Refill  . Accu-Chek FastClix Lancets MISC Check blood sugar 3 times a day 306 each 3  . acetaminophen (TYLENOL) 325 MG tablet Take 325 mg by mouth every 6 (six) hours as needed for moderate pain or headache.     Marland Kitchen amiodarone (PACERONE) 200 MG tablet Take 1 tablet by mouth once daily (Patient taking differently: Take 200 mg by mouth daily. ) 30 tablet 6  . aspirin 81 MG chewable tablet Chew 1 tablet (81 mg total) by mouth daily. 90 tablet 1  . bictegravir-emtricitabine-tenofovir AF (BIKTARVY) 50-200-25 MG TABS tablet Take 1 tablet by mouth daily. 30 tablet 11  . cinacalcet (SENSIPAR) 90 MG tablet Take 90 mg by mouth daily.  6  . ferric citrate (AURYXIA) 1 GM 210 MG(Fe) tablet Take 420-840 mg by mouth See admin instructions. Take 840 mg by mouth three times a day after  meals and 420 mg after each snack    . ferric gluconate 125 mg in sodium chloride 0.9 % 100 mL Inject 125 mg into the vein Every Tuesday,Thursday,and Saturday with dialysis.    Marland Kitchen gabapentin (NEURONTIN) 100 MG capsule Take 1 capsule (100 mg total) by mouth every dialysis. 15 capsule 1  . glucose blood (ACCU-CHEK GUIDE) test strip Use as instructed to check blood sugar 3 times a day 300 each 3  . insulin glargine (LANTUS) 100 UNIT/ML injection Inject 10 Units into the skin at bedtime.    . Insulin Pen Needle 32G X 4 MM MISC Use to inject both mealtime and long acting insulins up to 3 injections  a day 300 each 3  . metoprolol succinate (TOPROL-XL) 100 MG 24 hr tablet TAKE 1 TABLET BY MOUTH ONCE DAILY, TAKE WITH OR IMMEDIATELY FOLLOWING A MEAL (Patient taking differently: Take 100 mg by mouth daily. TAKE 1 TABLET BY MOUTH ONCE DAILY TAKE WITH OR IMMEDIATELY FOLLOWING A MEAL) 90 tablet 2  . nitroGLYCERIN (NITROSTAT) 0.4 MG SL tablet Place 1 tablet (0.4 mg total) under the tongue every 5 (five) minutes as needed for chest pain. 30 tablet 0  . omega-3 acid ethyl esters (LOVAZA) 1 g capsule Take 2 capsules (2 g total) by mouth 2 (two) times daily. 120 capsule 11  . pantoprazole (PROTONIX) 40 MG tablet Take 2 tablets (80 mg total) by mouth daily. 60 tablet 1  . polycarbophil (FIBERCON) 625 MG tablet Take 1 tablet (625 mg total) by mouth daily. 30 tablet 0  . ranolazine (RANEXA) 500 MG 12 hr tablet Take 1 tablet (500 mg total) by mouth 2 (two) times daily. (Patient taking differently: Take 500 mg by mouth daily. ) 60 tablet 0  . rosuvastatin (CRESTOR) 20 MG tablet TAKE 1 TABLET BY MOUTH ONCE DAILY AT 6PM (Patient taking differently: Take 20 mg by mouth daily. ) 90 tablet 0  . Semaglutide (RYBELSUS) 14 MG TABS Take 14 mg by mouth daily. 90 tablet 0  . Skin Protectants, Misc. (EUCERIN) cream Apply topically as needed for dry skin. 454 g 0  . traMADol (ULTRAM) 50 MG tablet Take 1 tablet (50 mg total) by  mouth every 6 (six) hours as needed. 20 tablet 0  . warfarin (COUMADIN) 7.5 MG tablet TAKE 1/2 (ONE-HALF) TABLET BY MOUTH ONCE DAILY EXCEPT 1 TABLET ON MONDAY,WEDNESDAY,AND SATURDAY OR AS DIRECTED BY [facility] (Patient taking differently: Take 3.75 mg by mouth daily at 6 PM. ) 90 tablet 0   No current facility-administered medications for this visit.      REVIEW OF SYSTEMS: see HPI  for pertinent positives and negatives    PHYSICAL EXAMINATION:     Vitals:   11/05/19 0911  BP: 97/66  Pulse: 82  Resp: 16  Temp: 98.3 F (36.8 C)  TempSrc: Temporal  SpO2: 94%  Weight: 273 lb 3.2 oz (123.9 kg)  Height: 6\' 5"  (1.956 m)   Body mass index is 32.4 kg/m.  General: Obese male in NAD HEENT:  No gross abnormalities Pulmonary: Respirations are non-labored, fair air movement in all fields, no rales, rhonchi, or wheezes Abdomen: Soft and non-tender with normal bowel sounds. Musculoskeletal: There are no major deformities.   Neurologic: No focal weakness or paresthesias are detected, Skin: Necrotic left 3rd finger tip, see photos below Psychiatric: The patient has normal affect. Cardiovascular: There is a regular rate and rhythm without significant murmur appreciated. Bilateral radial pulses are faintly palpable. Left forearm AVF with faintly palpable thrill, bruit is audible and has a whistling sound on ausculation distally    Left 3rd finger necrotic tip   Left 3rd finger necrosis    Non-Invasive Vascular Imaging  Left upper arm Access Duplex  (Date: 10-29-19):  Findings: +--------------------+----------+-----------------+--------+ AVF PSV (cm/s)Flow Vol (mL/min)Comments +--------------------+----------+-----------------+--------+ Native artery inflow 234  368   +--------------------+----------+-----------------+--------+ AVF Anastomosis  141     +--------------------+----------+-----------------+--------+   +------------+----------+-------------+----------+--------------+ OUTFLOW VEINPSV (cm/s)Diameter (cm)Depth (cm) Describe  +------------+----------+-------------+----------+--------------+ Prox Forearm 30  0.61   branch 0.41  +------------+----------+-------------+----------+--------------+ Mid Forearm  66  0.58  Retained valve +------------+----------+-------------+----------+--------------+ Dist Forearm 32  0.49    +------------+----------+-------------+----------+--------------+ Summary: Patent arteriovenous fistula with decreased flow.   Medical Decision Making  Thomas [name] is a 53 y.o. male who presents with 2 months history of worsening ulcer at left 3rd finger tip. His entire left hand feels numb and painful, per pt.  Pt is s/p creation of left radiocephalic AV fistulaand ligation of right radiocephalic AV fistula(steal symptoms in the right armandnonhealing wound of right thumb followed byhand surgery) by Dr. Scot Dock, and placement of tunneled dialysis catheter dictated separately by Dr. Samuel Bouche 06-25-19.  He is still dialyzing via right IJ TDC on TTS.   His right forearm access was his first access, this was ligated due to right thumb ulcer which has healed.   Will schedule for fistula gram, first available [facility] surgeon ASAP, to evaluate and treat possible stenosis of the left forearm AVF, left 3rd finger tip necrosis.    Clemon Chambers, RN, MSN, FNP-C [facility] Office: [phone]

## 2019-11-19 NOTE — Op Note (Signed)
Date: November 19, 2019  Preoperative diagnosis: End-stage renal disease with need for permanent hemodialysis access and failed upper extremity access in the setting of steal syndrome  Postoperative diagnosis: Same  Procedure: Right femoral loop graft (4 mm x 7 mm tapered Gore-Tex graft)  Surgeon: Dr. [name], MD  Assistant: OR staff  Indications: Patient is a 53 year old male with end-stage renal disease that has history of failed bilateral upper extremity access in setting of bilateral upper extremity steal syndrome.  He presents today for planned right femoral loop graft after risk and benefits were discussed.    Findings: The right SFA was calcified and as a result came off the right common femoral artery on a soft spot in the artery with the 4 mm end of the graft.  The 7 mm end was then sewn to the saphenofemoral junction onto the common femoral vein after the graft was tunneled in loop configuration in the right thigh.  There was a thrill in the graft.  Excellent doppler flow in right SFA and profunda after graft placement.  Anesthesia: General  Details: Patient was taken to the operating room after informed consent was obtained.  Placed on operative table in supine position.  His right groin prepped and draped in usual sterile fashion.  Given he is moderately obese, I placed a transverse incision above his groin crease.  Through this I dissected down with Bovie cautery and opened subcutaneous tissue.  Ultimately the common femoral artery was identified as well as the SFA and profunda and all these were dissected out with Bovie cautery and controlled Vesseloops.  The SFA was fairly calcified and the common femoral also had a posterior plaque but was soft on the anterior wall.  I then used Bovie cautery dissected out the common femoral vein found the saphenofemoral junction.  A 4 mm x 7 mm tapered Gore-Tex graft was brought on the field.  Ultimately a counterincision was made on  the anterior thigh and I used a curved tunneler and the graft was tunneled in loop configuration with the 4 mm end oriented toward the common femoral artery and 7 mm end oriented to the saphenofemoral junction.  Patient was given 100 units/kg heparin.  Ultimately used a Henley clamp on the common femoral artery and had to get loops on the SFA and profunda for control.  The common femoral artery was then opened with a 11 blade scalpel.  The end of the graft was then spatulated and sewn with 6-0 Prolene in parachute format.  There was excellent pulsatile inflow through the graft.  I then ligated the saphenous vein just before the saphenofemoral junction.  I put a Cooley clamp on the common femoral vein to control the saphenofemoral junction and then the venotomy was spatulated with Potts scissors.  I then pulled the graft the appropriate length and spatulated the end of it and it was sewn in parachute format to the saphenofemoral junction onto the common femoral vein with 6-0 Prolene.  I de-aired everything prior to completion.  Ultimately there was a palpable thrill in the graft in the groin.  I had a harder time feeling a thrill in the graft down on the thigh.  I then used the counterincision to try to ensure with finger dissection that the graft was not kinked and pulled on the graft to ensure it was as gentle loop as possible.  I did have to repair the common femoral artery with several 6-0 Prolene's.  There was excellent  Doppler flow in the SFA and the profunda.  All the wounds were copiously irrigated with saline.  Patient was given protamine for reversal.  The groin incision was closed with a deep 2-0 Vicryl, multiple 3-0 Vicryl's, and 4-0 Monocryl in the skin with Dermabond.  The counterincision was closed with 3-0 Vicryl and 4-0 Monocryl and Dermabond.    Complications: None  Condition: Stable  [name], MD [facility] Office: [phone] Pager:  [place]

## 2019-11-20 DIAGNOSIS — I132 Hypertensive heart and chronic kidney disease with heart failure and with stage 5 chronic kidney disease, or end stage renal disease: Secondary | ICD-10-CM | POA: Diagnosis not present

## 2019-11-20 LAB — BASIC METABOLIC PANEL
Anion gap: 18 — ABNORMAL HIGH (ref 5–15)
BUN: 51 mg/dL — ABNORMAL HIGH (ref 6–20)
CO2: 20 mmol/L — ABNORMAL LOW (ref 22–32)
Calcium: 8.6 mg/dL — ABNORMAL LOW (ref 8.9–10.3)
Chloride: 98 mmol/L (ref 98–111)
Creatinine, Ser: 10.24 mg/dL — ABNORMAL HIGH (ref 0.61–1.24)
GFR calc Af Amer: 6 mL/min — ABNORMAL LOW (ref >60)
GFR calc non Af Amer: 5 mL/min — ABNORMAL LOW (ref >60)
Glucose, Bld: 274 mg/dL — ABNORMAL HIGH (ref 70–99)
Potassium: 3.9 mmol/L (ref 3.5–5.1)
Sodium: 136 mmol/L (ref 135–145)

## 2019-11-20 LAB — GLUCOSE, CAPILLARY: Glucose-Capillary: 262 mg/dL — ABNORMAL HIGH (ref 70–99)

## 2019-11-20 LAB — CBC
HCT: 33 % — ABNORMAL LOW (ref 39.0–52.0)
Hemoglobin: 10.9 g/dL — ABNORMAL LOW (ref 13.0–17.0)
MCH: 34.5 pg — ABNORMAL HIGH (ref 26.0–34.0)
MCHC: 33 g/dL (ref 30.0–36.0)
MCV: 104.4 fL — ABNORMAL HIGH (ref 80.0–100.0)
Platelets: 139 10*3/uL — ABNORMAL LOW (ref 150–400)
RBC: 3.16 MIL/uL — ABNORMAL LOW (ref 4.22–5.81)
RDW: 14.9 % (ref 11.5–15.5)
WBC: 8.6 10*3/uL (ref 4.0–10.5)
nRBC: 0 % (ref 0.0–0.2)

## 2019-11-20 LAB — MRSA PCR SCREENING: MRSA by PCR: NEGATIVE

## 2019-11-20 MED ORDER — CEPHALEXIN 500 MG PO CAPS: 500.0000 mg | ORAL_CAPSULE | Freq: Two times a day (BID) | ORAL | 0 refills | Status: AC

## 2019-11-20 MED ORDER — CEPHALEXIN 500 MG PO CAPS
500.0000 mg | ORAL_CAPSULE | Freq: Two times a day (BID) | ORAL | Status: DC
  Administered 2019-11-20: 500 mg via ORAL
  Filled 2019-11-20: qty 1

## 2019-11-20 MED ORDER — CHLORHEXIDINE GLUCONATE CLOTH 2 % EX PADS: 6.0000 | MEDICATED_PAD | Freq: Every day | CUTANEOUS | Status: DC

## 2019-11-20 NOTE — Progress Notes (Signed)
  Progress Note    11/20/2019 10:02 AM 1 Day Post-Op  Subjective: No overnight issues, is having some penile burning although patient does not make urine  Vitals:   11/20/19 0500 11/20/19 0953  BP:  94/65  Pulse: 83 82  Resp: 16 18  Temp: 98.3 F (36.8 C) 98.9 F (37.2 C)  SpO2: 92% 98%    Physical Exam: Awake alert oriented Right groin incision clean dry intact Strong thrill laterally and graft  CBC    Component Value Date/Time   WBC 8.6 11/20/2019 0445   RBC 3.16 (L) 11/20/2019 0445   HGB 10.9 (L) 11/20/2019 0445   HCT 33.0 (L) 11/20/2019 0445   PLT 139 (L) 11/20/2019 0445   MCV 104.4 (H) 11/20/2019 0445   MCH 34.5 (H) 11/20/2019 0445   MCHC 33.0 11/20/2019 0445   RDW 14.9 11/20/2019 0445   LYMPHSABS 1,018 10/11/2019 1155   MONOABS 0.7 06/04/2018 1806   EOSABS 198 10/11/2019 1155   BASOSABS 58 10/11/2019 1155    BMET    Component Value Date/Time   NA 136 11/20/2019 0445   NA 139 03/08/2019 1122   K 3.9 11/20/2019 0445   CL 98 11/20/2019 0445   CO2 20 (L) 11/20/2019 0445   GLUCOSE 274 (H) 11/20/2019 0445   BUN 51 (H) 11/20/2019 0445   BUN 53 (H) 03/08/2019 1122   CREATININE 10.24 (H) 11/20/2019 0445   CREATININE 9.13 (H) 03/10/2017 1217   CALCIUM 8.6 (L) 11/20/2019 0445   GFRNONAA 5 (L) 11/20/2019 0445   GFRNONAA 6 (L) 03/10/2017 1217   GFRAA 6 (L) 11/20/2019 0445   GFRAA 7 (L) 03/10/2017 1217    INR    Component Value Date/Time   INR 1.7 (H) 11/19/2019 0644     Intake/Output Summary (Last 24 hours) at 11/20/2019 1002 Last data filed at 11/20/2019 0600 Gross per 24 hour  Intake 927.77 ml  Output -  Net 927.77 ml     Assessment/plan:  53 y.o. male is s/p right femoral AV loop graft.  He is having some penile burning this morning in the urge to urinate although he does not make urine.  I have started him on Keflex and he will get this as an outpatient for 1 week.  I will have him follow-up in 2 weeks for wound check at which time we can  release him to use his graft.  I discussed his case with nephrology and will discharge him this morning in time for him to make his noon dialysis session as an outpatient.    [name] Matters, MD [facility] [place] Office: [phone] Pager: [phone]  11/20/2019 10:02 AM

## 2019-11-20 NOTE — Plan of Care (Signed)
See discharge note.

## 2019-11-20 NOTE — Progress Notes (Signed)
[facility] Quick Note:  Aware that he was admitted OBV overnight for pain control s/p new L thigh AVG placement.  Today, he denies CP/dyspnea. Says pain is improved/tolerable.  Dr. Donzetta Matters came to see him this morning - plan is to discharge him now with plan for him to go to his usual dialysis today - needs to be there by 12 noon.  Will send updated HD orders to his usual clinic.  He is c/o dysuria/urgency which started overnight - painful. Essentially anuric. VVS aware and will Rx Kelfex to cover UTI symptoms.  Veneta Penton, PA-C [employer] Pager [phone]

## 2019-11-21 ENCOUNTER — Telehealth: Payer: Self-pay | Admitting: Nephrology

## 2019-11-21 IMAGING — DX DG CHEST 1V PORT
2 series · 2 of 2 positions shown · non-contrast
Comparison: 06/25/2019 and prior chest radiographs

CLINICAL DATA: Acute chest pain

EXAM:
PORTABLE CHEST 1 VIEW

[chest ap (1 of 2)]
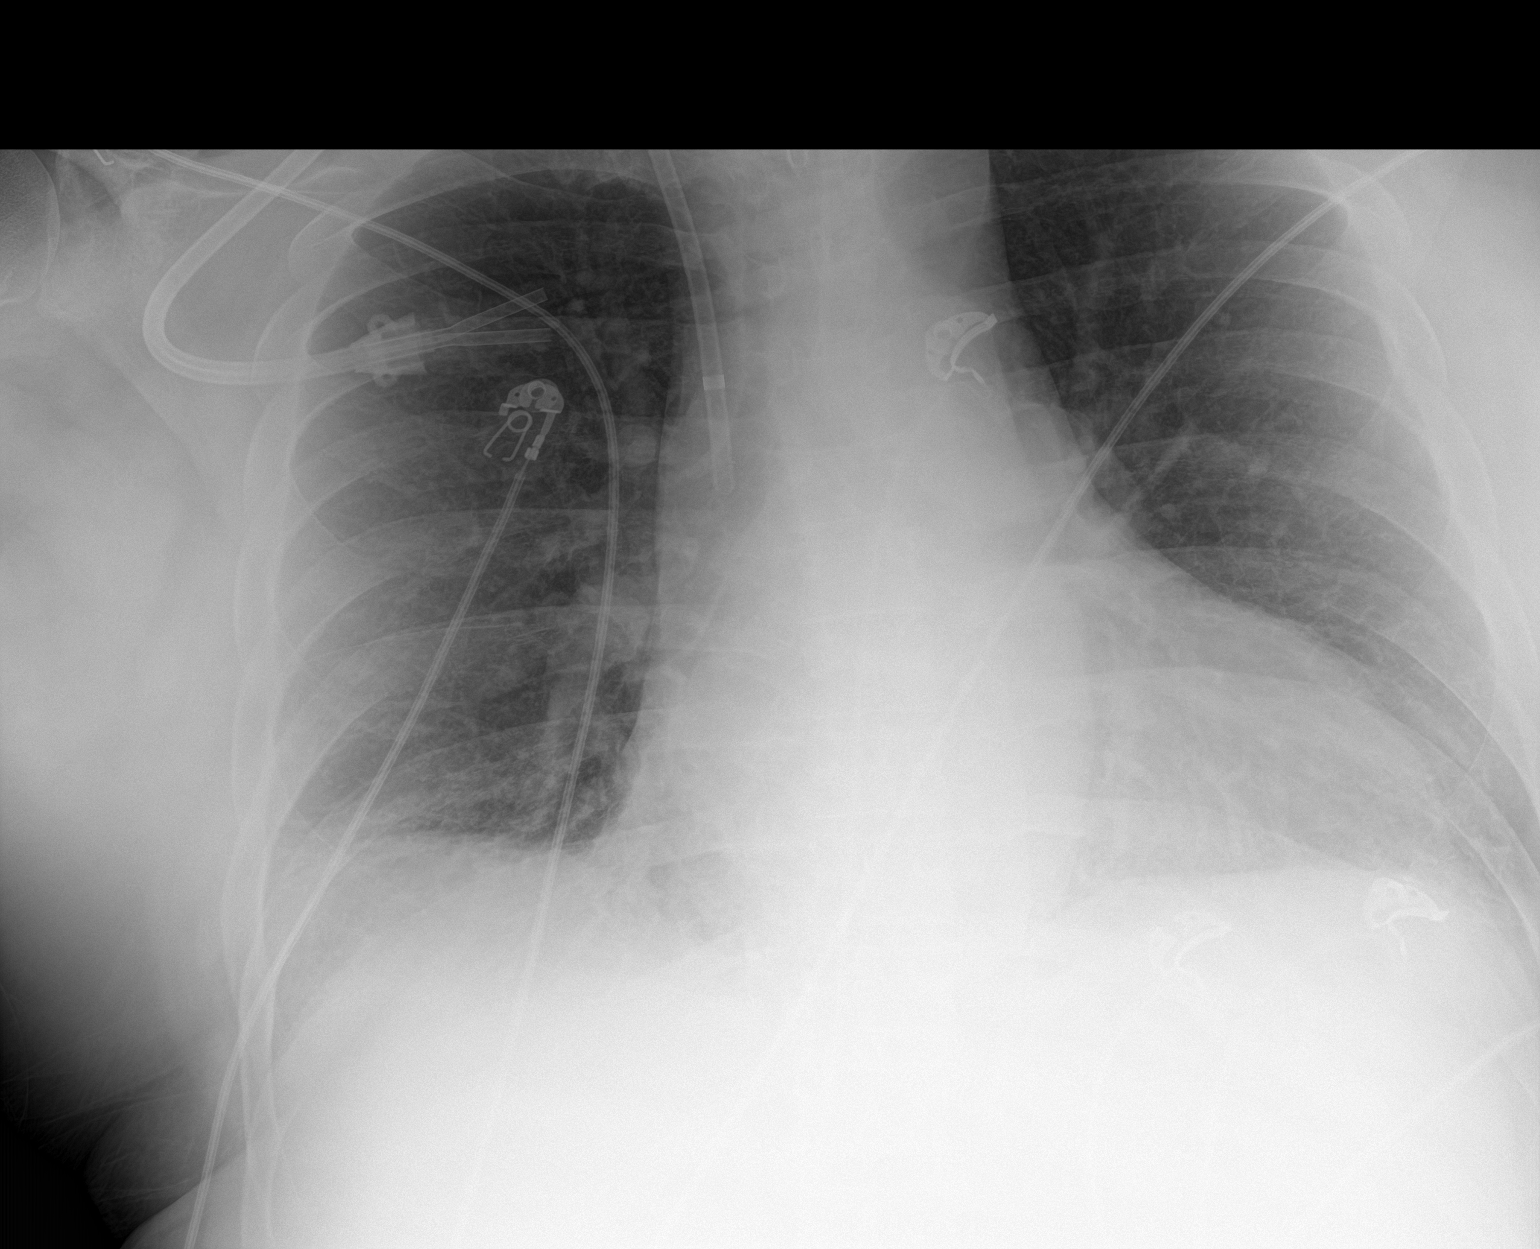

[chest ap (2 of 2)]
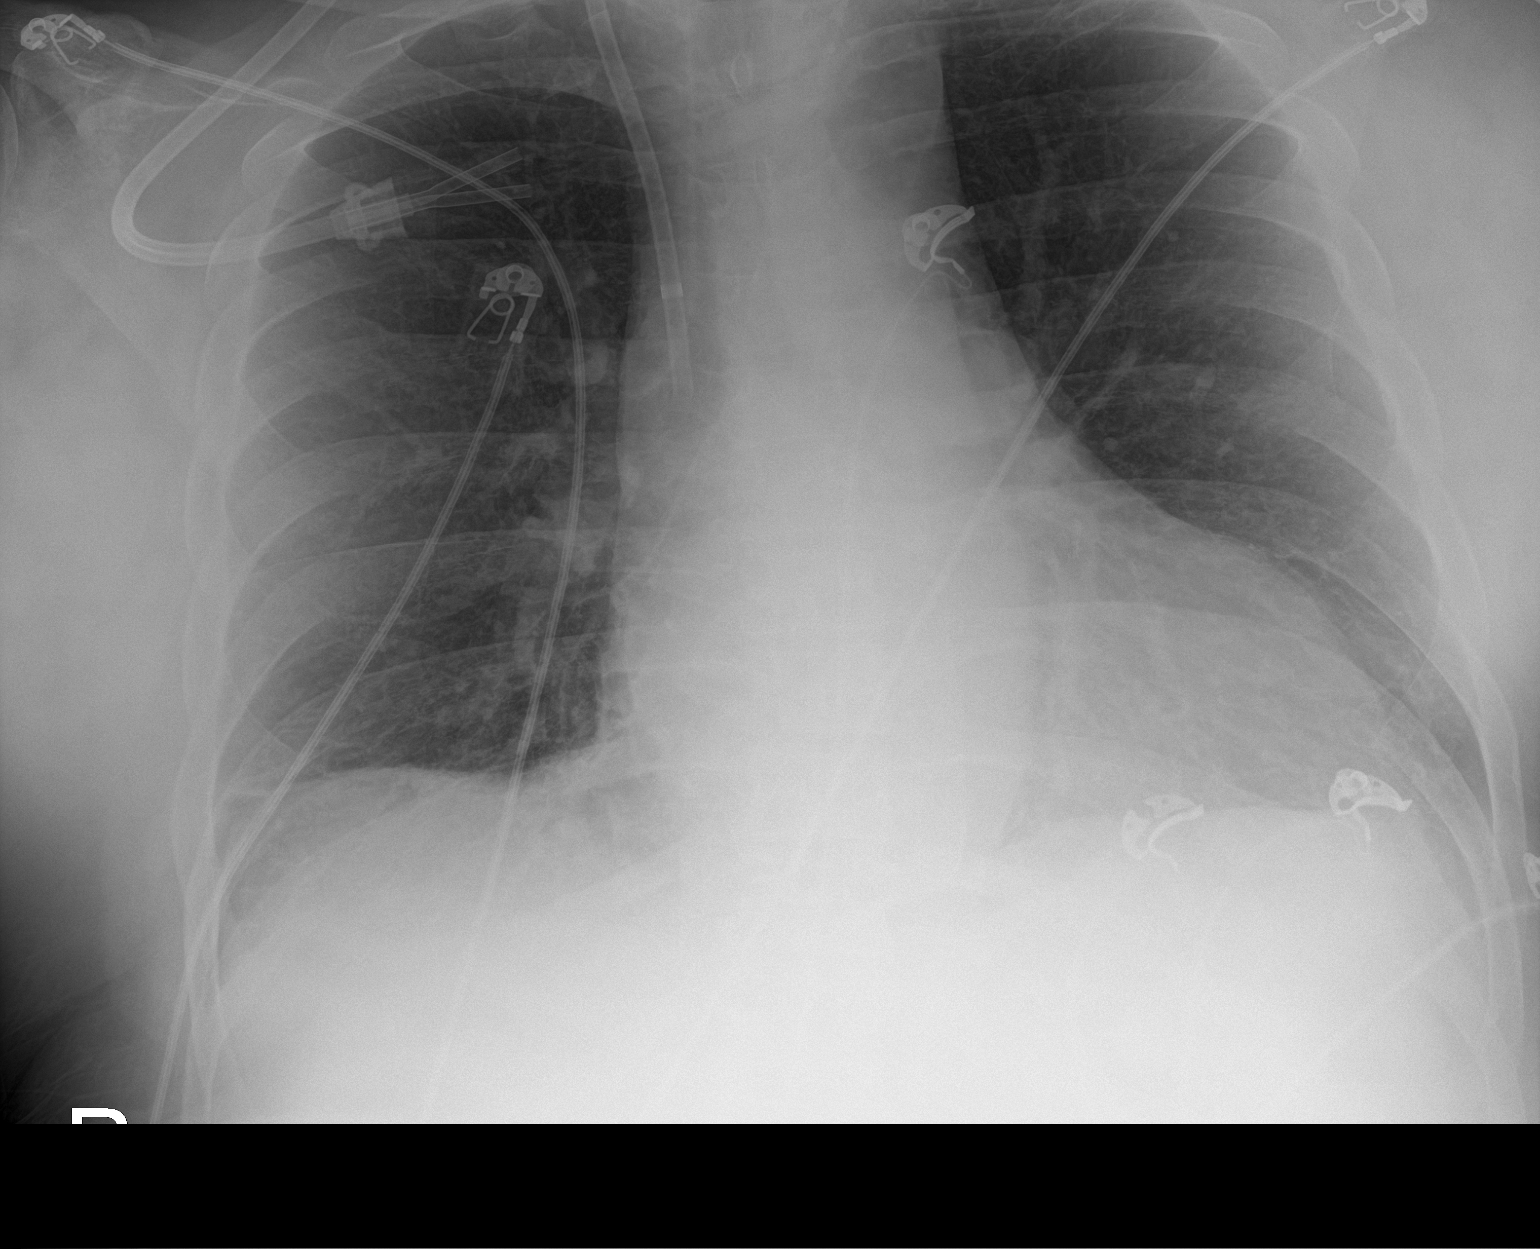

[2 of 2 positions shown; findings below may reference images not displayed]

FINDINGS: Cardiomegaly and mild basilar atelectasis noted.

A RIGHT IJ central venous catheter is noted with tip overlying the
LOWER SVC.

There is no evidence of focal airspace disease, pulmonary edema,
suspicious pulmonary nodule/mass, pleural effusion, or pneumothorax.

No acute bony abnormalities are identified.
IMPRESSION: 1. Cardiomegaly with mild bibasilar atelectasis.

## 2019-11-21 NOTE — Discharge Summary (Signed)
Physician Discharge Summary  Patient ID: Thomas [name] MRN: [id number] DOB/AGE: [birth year] 53 y.o.  Admit date: 11/19/2019 Discharge date: 11/20/2019  Admission Diagnosis: End-stage renal disease  Discharge Diagnoses:  Same  Secondary Diagnoses: Active Problems:   ESRD (end stage renal disease) ([place])   Procedures: Right thigh arteriovenous graft  Discharged Condition: good  Hospital Course: Patient was admitted after placement of right thigh AV graft.  He was having some urethral burning with urgency although he does not make urine.  He was given a dose of Keflex on postoperative day 1.  He was then discharged with 7-day course of Keflex and plan for dialysis at his home center on the day of discharge.  Consults:  Treatment Team:  Madelon Lips, MD  Significant Diagnostic Studies: CBC CBC Latest Ref Rng & Units 11/20/2019 11/19/2019 11/10/2019  WBC 4.0 - 10.5 K/uL 8.6 - -  Hemoglobin 13.0 - 17.0 g/dL 10.9(L) 13.3 13.9  Hematocrit 39.0 - 52.0 % 33.0(L) 39.0 41.0  Platelets 150 - 400 K/uL 139(L) - -      COAG Lab Results  Component Value Date   INR 1.7 (H) 11/19/2019   INR 1.4 (A) 11/12/2019   INR 1.5 (H) 11/10/2019   No results found for: PTT  Disposition:   Discharge Instructions    Call MD for:  redness, tenderness, or signs of infection (pain, swelling, bleeding, redness, odor or green/yellow discharge around incision site)   Complete by: As directed    Call MD for:  severe or increased pain, loss or decreased feeling  in affected limb(s)   Complete by: As directed    Call MD for:  temperature >100.5   Complete by: As directed    Resume previous diet   Complete by: As directed      Allergies as of 11/20/2019      Reactions   Oxycodone-acetaminophen Other (See Comments)   Hallucinations/delirum      Medication List    TAKE these medications   Accu-Chek FastClix Lancets Misc Check blood sugar 3 times a day   acetaminophen 500 MG tablet  Commonly known as: TYLENOL Take 500 mg by mouth every evening.   amiodarone 200 MG tablet Commonly known as: PACERONE Take 1 tablet by mouth once daily   aspirin 81 MG chewable tablet Chew 1 tablet (81 mg total) by mouth daily.   Auryxia 1 GM 210 MG(Fe) tablet Generic drug: ferric citrate Take 420-840 mg by mouth See admin instructions. Take 840 mg by mouth three times a day after meals and 420 mg after each snack   bictegravir-emtricitabine-tenofovir AF 50-200-25 MG Tabs tablet Commonly known as: Biktarvy Take 1 tablet by mouth daily.   cephALEXin 500 MG capsule Commonly known as: KEFLEX Take 1 capsule (500 mg total) by mouth 2 (two) times daily for 7 days.   cinacalcet 90 MG tablet Commonly known as: SENSIPAR Take 90 mg by mouth daily.   eucerin cream Apply topically as needed for dry skin. What changed: how much to take   ferric gluconate 125 mg in sodium chloride 0.9 % 100 mL Inject 125 mg into the vein Every Tuesday,Thursday,and Saturday with dialysis.   gabapentin 100 MG capsule Commonly known as: Neurontin Take 1 capsule (100 mg total) by mouth every dialysis. What changed:   how much to take  when to take this   glucose blood test strip Commonly known as: Accu-Chek Guide Use as instructed to check blood sugar 3 times a day   Insulin  Pen Needle 32G X 4 MM Misc Use to inject both mealtime and long acting insulins up to 3 injections  a day   Lantus 100 UNIT/ML injection Generic drug: insulin glargine Inject 10 Units into the skin at bedtime.   metoprolol succinate 100 MG 24 hr tablet Commonly known as: TOPROL-XL TAKE 1 TABLET BY MOUTH ONCE DAILY, TAKE WITH OR IMMEDIATELY FOLLOWING A MEAL What changed: See the new instructions.   nitroGLYCERIN 0.4 MG SL tablet Commonly known as: NITROSTAT Place 1 tablet (0.4 mg total) under the tongue every 5 (five) minutes as needed for chest pain.   omega-3 acid ethyl esters 1 g capsule Commonly known as: Lovaza  Take 2 capsules (2 g total) by mouth 2 (two) times daily.   pantoprazole 40 MG tablet Commonly known as: PROTONIX Take 2 tablets (80 mg total) by mouth daily.   ranolazine 500 MG 12 hr tablet Commonly known as: RANEXA Take 1 tablet (500 mg total) by mouth 2 (two) times daily. What changed: when to take this   rosuvastatin 20 MG tablet Commonly known as: CRESTOR TAKE 1 TABLET BY MOUTH ONCE DAILY AT 6PM What changed: See the new instructions.   Rybelsus 3 MG Tabs Generic drug: Semaglutide Take 9 mg by mouth daily.   Rybelsus 14 MG Tabs Generic drug: Semaglutide Take 14 mg by mouth daily.   warfarin 7.5 MG tablet Commonly known as: COUMADIN Take as directed. If you are unsure how to take this medication, talk to your nurse or doctor. Original instructions: TAKE 1/2 (ONE-HALF) TABLET BY MOUTH ONCE DAILY EXCEPT 1 TABLET ON MONDAY,WEDNESDAY,AND SATURDAY OR AS DIRECTED BY ANTICOAGULATIN CLINIC What changed:   how much to take  how to take this  when to take this  additional instructions      Follow-up Information    [name], MD In 2 weeks.   Specialty: Vascular Surgery Why: Office will call you to arrange your appt (sent) Contact information: [address] [place] [address] [phone]           Signed:  Eda Paschal. Donzetta Matters, MD [facility] [place] Office: [phone] Pager: [phone]   11/21/2019, 10:30 AM

## 2019-11-21 NOTE — Telephone Encounter (Signed)
Transition of care contact from inpatient facility  Date of discharge: 11/20/19 Date of contact: 11/21/19 Method of contact: Phone Spoke to patient  Patient contacted to discuss transition of care from recent inpatient hospitalization. He was admitted observation status to [facility] from 11/20-11/21 with discharge diagnosis of ESRD/vascular access surgery.  He was able to go to his usual dialysis treatment yesterday without issues. He was able to pick up the Keflex which was prescribed for dysuria - symptoms improving. L thigh pain is controlled at this time.  Med changes reviewed - as above. Pt will f/u with his outpatient dialysis center: tomorrow, per holiday schedule. Other f/u needed: nothing at this time.  Veneta Penton, PA-C [employer] Pager [phone]

## 2019-11-22 ENCOUNTER — Encounter (HOSPITAL_COMMUNITY): Payer: Self-pay | Admitting: Vascular Surgery

## 2019-11-30 ENCOUNTER — Ambulatory Visit: Payer: Medicare Other | Admitting: Pharmacist

## 2019-11-30 DEATH — deceased

## 2019-12-08 ENCOUNTER — Telehealth: Payer: Self-pay | Admitting: Internal Medicine
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
## 2019-12-14 ENCOUNTER — Ambulatory Visit: Payer: Medicare Other

## 2019-12-20 ENCOUNTER — Telehealth: Payer: Self-pay | Admitting: Internal Medicine

## 2019-12-20 NOTE — Telephone Encounter (Signed)
Rec'd call from the [facility] requesting you to give Thomas [name] a call back at [phone] or her Cell number [phone]  In reference to this patient.

## 2019-12-20 NOTE — Telephone Encounter (Signed)
Thank you. I have returned the call. Death certificate will be sent up here. I attempted to call his wife to discuss the changes to the death certificate and that the case was discussed with the medical examiner. She did not answer and her voicemail was full.   Thank you,  [name], MD

## 2020-01-10 ENCOUNTER — Ambulatory Visit: Payer: Medicare Other | Admitting: Internal Medicine

## 2021-09-14 ENCOUNTER — Other Ambulatory Visit (HOSPITAL_COMMUNITY): Payer: Self-pay
